# Patient Record
Sex: Male | Born: 1950
Health system: Southern US, Community
[De-identification: ages and names within clinical notes are randomized; demographics above are authoritative.]

## PROBLEM LIST (undated history)

## (undated) DIAGNOSIS — G43009 Migraine without aura, not intractable, without status migrainosus: Secondary | ICD-10-CM

## (undated) DIAGNOSIS — I2510 Atherosclerotic heart disease of native coronary artery without angina pectoris: Secondary | ICD-10-CM

## (undated) DIAGNOSIS — L0390 Cellulitis, unspecified: Secondary | ICD-10-CM

## (undated) DIAGNOSIS — D649 Anemia, unspecified: Secondary | ICD-10-CM

## (undated) DIAGNOSIS — K219 Gastro-esophageal reflux disease without esophagitis: Secondary | ICD-10-CM

## (undated) DIAGNOSIS — H269 Unspecified cataract: Secondary | ICD-10-CM

## (undated) DIAGNOSIS — Z8719 Personal history of other diseases of the digestive system: Secondary | ICD-10-CM

## (undated) DIAGNOSIS — Z87442 Personal history of urinary calculi: Secondary | ICD-10-CM

## (undated) DIAGNOSIS — N189 Chronic kidney disease, unspecified: Secondary | ICD-10-CM

## (undated) DIAGNOSIS — C4490 Unspecified malignant neoplasm of skin, unspecified: Secondary | ICD-10-CM

## (undated) DIAGNOSIS — H9190 Unspecified hearing loss, unspecified ear: Secondary | ICD-10-CM

## (undated) DIAGNOSIS — K635 Polyp of colon: Secondary | ICD-10-CM

## (undated) DIAGNOSIS — M1990 Unspecified osteoarthritis, unspecified site: Secondary | ICD-10-CM

## (undated) DIAGNOSIS — Z86711 Personal history of pulmonary embolism: Secondary | ICD-10-CM

## (undated) DIAGNOSIS — R0600 Dyspnea, unspecified: Secondary | ICD-10-CM

## (undated) DIAGNOSIS — G4730 Sleep apnea, unspecified: Secondary | ICD-10-CM

## (undated) DIAGNOSIS — I2699 Other pulmonary embolism without acute cor pulmonale: Secondary | ICD-10-CM

## (undated) DIAGNOSIS — R413 Other amnesia: Secondary | ICD-10-CM

## (undated) DIAGNOSIS — R011 Cardiac murmur, unspecified: Secondary | ICD-10-CM

## (undated) DIAGNOSIS — T7840XA Allergy, unspecified, initial encounter: Secondary | ICD-10-CM

## (undated) DIAGNOSIS — I10 Essential (primary) hypertension: Secondary | ICD-10-CM

## (undated) DIAGNOSIS — G510 Bell's palsy: Secondary | ICD-10-CM

## (undated) DIAGNOSIS — I509 Heart failure, unspecified: Secondary | ICD-10-CM

## (undated) DIAGNOSIS — H00019 Hordeolum externum unspecified eye, unspecified eyelid: Secondary | ICD-10-CM

## (undated) DIAGNOSIS — J189 Pneumonia, unspecified organism: Secondary | ICD-10-CM

## (undated) HISTORY — DX: Unspecified malignant neoplasm of skin, unspecified: C44.90

## (undated) HISTORY — PX: CATARACT EXTRACTION: SUR2

## (undated) HISTORY — PX: OTHER SURGICAL HISTORY: SHX169

## (undated) HISTORY — DX: Gastro-esophageal reflux disease without esophagitis: K21.9

## (undated) HISTORY — PX: HIATAL HERNIA REPAIR: SHX195

## (undated) HISTORY — DX: Polyp of colon: K63.5

## (undated) HISTORY — DX: Unspecified osteoarthritis, unspecified site: M19.90

## (undated) HISTORY — DX: Other amnesia: R41.3

## (undated) HISTORY — PX: VASECTOMY: SHX75

## (undated) HISTORY — DX: Allergy, unspecified, initial encounter: T78.40XA

## (undated) HISTORY — PX: CARDIAC CATHETERIZATION: SHX172

## (undated) HISTORY — DX: Migraine without aura, not intractable, without status migrainosus: G43.009

## (undated) HISTORY — DX: Essential (primary) hypertension: I10

## (undated) HISTORY — DX: Unspecified hearing loss, unspecified ear: H91.90

## (undated) HISTORY — PX: EYE SURGERY: SHX253

## (undated) HISTORY — DX: Unspecified cataract: H26.9

## (undated) HISTORY — PX: MYRINGOTOMY: SUR874

---
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
## 1992-12-23 HISTORY — PX: STRABISMUS SURGERY: SHX218

## 1996-12-23 HISTORY — PX: ORIF TIBIA & FIBULA FRACTURES: SHX2131

## 2011-03-19 ENCOUNTER — Ambulatory Visit: Payer: Self-pay | Admitting: Internal Medicine

## 2011-04-05 ENCOUNTER — Other Ambulatory Visit (INDEPENDENT_AMBULATORY_CARE_PROVIDER_SITE_OTHER): Payer: Self-pay | Admitting: Internal Medicine

## 2011-04-05 ENCOUNTER — Other Ambulatory Visit: Payer: Self-pay | Admitting: Internal Medicine

## 2011-04-05 ENCOUNTER — Other Ambulatory Visit (INDEPENDENT_AMBULATORY_CARE_PROVIDER_SITE_OTHER): Payer: Self-pay

## 2011-04-05 DIAGNOSIS — Z0389 Encounter for observation for other suspected diseases and conditions ruled out: Secondary | ICD-10-CM

## 2011-04-05 DIAGNOSIS — Z0000 Encounter for general adult medical examination without abnormal findings: Secondary | ICD-10-CM

## 2011-04-05 DIAGNOSIS — E785 Hyperlipidemia, unspecified: Secondary | ICD-10-CM

## 2011-04-05 LAB — CBC WITH DIFFERENTIAL/PLATELET
Basophils Relative: 0 % (ref 0.0–3.0)
Eosinophils Relative: 0 % (ref 0.0–5.0)
HCT: 42.3 % (ref 39.0–52.0)
Hemoglobin: 14.4 g/dL (ref 13.0–17.0)
Lymphs Abs: 1 10*3/uL (ref 0.7–4.0)
MCV: 96.9 fl (ref 78.0–100.0)
Monocytes Absolute: 0.5 10*3/uL (ref 0.1–1.0)
Monocytes Relative: 2.6 % — ABNORMAL LOW (ref 3.0–12.0)
Neutro Abs: 18.3 10*3/uL — ABNORMAL HIGH (ref 1.4–7.7)
Platelets: 291 10*3/uL (ref 150.0–400.0)
RBC: 4.37 Mil/uL (ref 4.22–5.81)
WBC: 19.8 10*3/uL (ref 4.5–10.5)

## 2011-04-05 LAB — LIPID PANEL
Cholesterol: 232 mg/dL — ABNORMAL HIGH (ref 0–200)
Total CHOL/HDL Ratio: 5
Triglycerides: 84 mg/dL (ref 0.0–149.0)

## 2011-04-05 LAB — BASIC METABOLIC PANEL
BUN: 12 mg/dL (ref 6–23)
Chloride: 110 mEq/L (ref 96–112)
GFR: 123.91 mL/min (ref >60.00)
Potassium: 4.1 mEq/L (ref 3.5–5.1)
Sodium: 142 mEq/L (ref 135–145)

## 2011-04-05 LAB — URINALYSIS
Bilirubin Urine: NEGATIVE
Hgb urine dipstick: NEGATIVE
Total Protein, Urine: NEGATIVE
Urine Glucose: NEGATIVE

## 2011-04-05 LAB — HEPATIC FUNCTION PANEL
ALT: 42 U/L (ref 0–53)
AST: 23 U/L (ref 0–37)
Total Bilirubin: 0.6 mg/dL (ref 0.3–1.2)
Total Protein: 7.1 g/dL (ref 6.0–8.3)

## 2011-04-10 ENCOUNTER — Ambulatory Visit (INDEPENDENT_AMBULATORY_CARE_PROVIDER_SITE_OTHER): Payer: 59 | Admitting: Internal Medicine

## 2011-04-10 ENCOUNTER — Encounter: Payer: Self-pay | Admitting: Internal Medicine

## 2011-04-10 DIAGNOSIS — T6391XA Toxic effect of contact with unspecified venomous animal, accidental (unintentional), initial encounter: Secondary | ICD-10-CM

## 2011-04-10 DIAGNOSIS — E1121 Type 2 diabetes mellitus with diabetic nephropathy: Secondary | ICD-10-CM | POA: Insufficient documentation

## 2011-04-10 DIAGNOSIS — M159 Polyosteoarthritis, unspecified: Secondary | ICD-10-CM | POA: Insufficient documentation

## 2011-04-10 DIAGNOSIS — G43009 Migraine without aura, not intractable, without status migrainosus: Secondary | ICD-10-CM | POA: Insufficient documentation

## 2011-04-10 DIAGNOSIS — E119 Type 2 diabetes mellitus without complications: Secondary | ICD-10-CM

## 2011-04-10 DIAGNOSIS — K219 Gastro-esophageal reflux disease without esophagitis: Secondary | ICD-10-CM

## 2011-04-10 DIAGNOSIS — M1990 Unspecified osteoarthritis, unspecified site: Secondary | ICD-10-CM

## 2011-04-10 DIAGNOSIS — E1122 Type 2 diabetes mellitus with diabetic chronic kidney disease: Secondary | ICD-10-CM | POA: Insufficient documentation

## 2011-04-10 DIAGNOSIS — Z91030 Bee allergy status: Secondary | ICD-10-CM | POA: Insufficient documentation

## 2011-04-10 DIAGNOSIS — M109 Gout, unspecified: Secondary | ICD-10-CM | POA: Insufficient documentation

## 2011-04-10 DIAGNOSIS — Z23 Encounter for immunization: Secondary | ICD-10-CM

## 2011-04-10 DIAGNOSIS — J309 Allergic rhinitis, unspecified: Secondary | ICD-10-CM | POA: Insufficient documentation

## 2011-04-10 DIAGNOSIS — T63461A Toxic effect of venom of wasps, accidental (unintentional), initial encounter: Secondary | ICD-10-CM

## 2011-04-10 DIAGNOSIS — I10 Essential (primary) hypertension: Secondary | ICD-10-CM | POA: Insufficient documentation

## 2011-04-10 MED ORDER — TETANUS-DIPHTH-ACELL PERTUSSIS 5-2.5-18.5 LF-MCG/0.5 IM SUSP: 0.5000 mL | Freq: Once | INTRAMUSCULAR | Status: DC

## 2011-04-10 MED ORDER — PNEUMOCOCCAL VAC POLYVALENT 25 MCG/0.5ML IJ INJ: 0.5000 mL | INJECTION | Freq: Once | INTRAMUSCULAR | Status: DC

## 2011-04-10 MED ORDER — HYDROCODONE-ACETAMINOPHEN 5-325 MG PO TABS: 1.0000 | ORAL_TABLET | Freq: Two times a day (BID) | ORAL | Status: DC

## 2011-04-10 NOTE — Progress Notes (Signed)
Subjective:    Patient ID: Garrett [name], male    DOB: Apr 25, 1951, 60 y.o.   MRN: [id number]  HPIMr. Molino presents to establish for on-going continuity care. He has multiple medical problems but has no specific acute problems today.   Patient has diffuse arthritis. He did have a right hip injection recently by a PA at clinic in [place], wich may explain elevated WBC in the absence of any infection. May need follow-up  Past Medical History  Diagnosis Date  . Asthma     childhood asthma - not a active adult problem  . Arthritis     diffuse; shoulders, hips, knees - limits activities  . Gout     has gout in the back, diagnosed in his 30's. Diagnosis by aspiration  . Diabetes mellitus     has some peripheral neuropathy  . Migraine headache without aura     intermittently responsive to imitrex.  . Hypertension   . Colon polyps     last colonoscopy 2011  . GERD (gastroesophageal reflux disease)     controlled PPI use  . Allergy     hymenoptra with anaphylaxis, seasonal allergy as well.  Garlic allergy - angioedema   Past Surgical History  Procedure Date  . Vasectomy   . Cardiac catheterization '95    radial artery approach  . Myringotomy     several occasions '02-'03 for dizziness  . Strabismus surgery 1994    left eye  . Orif tibia & fibula fractures 1998    jumping off a wall  . Incision and drain '03    staph infection right elbow - required open surgery  . Hiatal hernia repair     done twice: '82 and 04   Family History  Problem Relation Age of Onset  . Hypertension Mother   . Dementia Mother   . Hypertension Sister   . Diabetes Maternal Grandmother   . Heart disease Maternal Grandfather   . Heart disease Paternal Grandmother   . Stroke Paternal Grandmother    History   Social History  . Marital Status: Married    Spouse Name: Foye Deer    Number of Children: N/A  . Years of Education: 13   Occupational History  . HVAC     self employed   Social  History Main Topics  . Smoking status: Former Smoker -- 3.0 packs/day for 30 years    Types: Cigarettes    Quit date: 01/09/1991  . Smokeless tobacco: Current User  . Alcohol Use: No  . Drug Use: Not on file  . Sexually Active: Not on file   Other Topics Concern  . Not on file   Social History Narrative   HSG, college graduate, [address] - [address]. Married '70. 1 son - '73; 2 grandchildren. 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 Marriage is in good health. End of Life - fully resuscitate, ok for short-term reversible mechanical ventilation, no prolonged heroic or futile care.            Review of Systems Review of Systems  Constitutional:  Negative for fever, chills, activity change and unexpected weight change.  HENT:  Positive for hearing loss Negative  ear pain, congestion, neck stiffness and postnasal drip.  Seasonal allergy Eyes: Negative for pain, discharge and visual disturbance.  Respiratory: Negative for chest tightness and wheezing.   Cardiovascular: Negative for chest pain and palpitations.       [No decreased exercise tolerance Gastrointestinal: [No  change in bowel habit. No bloating or gas. No reflux or indigestion Genitourinary: Negative for urgency, frequency, flank pain and difficulty urinating.  Musculoskeletal: Negative for myalgias, back pain, arthralgias and gait problem.  Neurological: Negative for dizziness, tremors, weakness and headaches.  Hematological: Negative for adenopathy.  Psychiatric/Behavioral: Negative for behavioral problems and dysphoric mood.       Objective:   Physical Exam WNWD heavyset white male in no distress HEENT - hearing aids in both ears. C&S clear, PERRLA, oropharynx without lesions. Posterior pharynx clear Neck - supple without thyromegaly Chest - increased AP diameter. No deformity Lungs - Clear  Cor - RRR without murmur or gallop Abdomen - soft, no guarding rebound or  tenderness Extremities - no deformity, normal peripheral pulses. Normal ROM about all joints Neuro - no focal findings Skin - clear   Outside lab: May '11 - CBC nl, Bmet normal x/ glucose 124, TSH 0.945, Chol 196, HDL 37, LDL 106, Tgy 267,PSA 0.85, Uric acid 4.2, A1C 6.2%       Assessment & Plan:  1. Hypertension - good control on present medications. Had normal electrolytes and renal function in May '11  2. Gout - stable with no recent flares. Location of gout flares were spinal. He is on allopurinol and last Uric Acid was in normal range May '11  3. Diabetes - stable with last A1C 5.9% in May '11  4. Lipids - normal  5. OA- diffuse OA. Currently he is doing ok on Celebrex, hydrocodone as needed.  6. Migraine - still has occasional headaches and does use imitrex prn.   7. Seasonal allergy - well controlled on nasal inhalational steroids.   In summary - a nice man who is now established for care. He is oriented to practice and services. He will return in May for wellness exam and annual labs.

## 2011-04-15 ENCOUNTER — Encounter: Payer: Self-pay | Admitting: Internal Medicine

## 2011-04-16 ENCOUNTER — Encounter: Payer: Self-pay | Admitting: Internal Medicine

## 2011-04-17 ENCOUNTER — Other Ambulatory Visit: Payer: Self-pay | Admitting: *Deleted

## 2011-04-17 MED ORDER — ALLOPURINOL 300 MG PO TABS: 300.0000 mg | ORAL_TABLET | Freq: Every day | ORAL | Status: DC

## 2011-04-17 MED ORDER — METOPROLOL TARTRATE 100 MG PO TABS: 100.0000 mg | ORAL_TABLET | Freq: Two times a day (BID) | ORAL | Status: DC

## 2011-04-17 MED ORDER — RAMIPRIL 10 MG PO CAPS: 10.0000 mg | ORAL_CAPSULE | Freq: Every day | ORAL | Status: DC

## 2011-04-17 MED ORDER — VERAPAMIL HCL ER 240 MG PO TBCR: 240.0000 mg | EXTENDED_RELEASE_TABLET | Freq: Every day | ORAL | Status: DC

## 2011-04-17 MED ORDER — CELECOXIB 200 MG PO CAPS: 200.0000 mg | ORAL_CAPSULE | Freq: Two times a day (BID) | ORAL | Status: DC

## 2011-04-17 MED ORDER — SUMATRIPTAN SUCCINATE 50 MG PO TABS: 50.0000 mg | ORAL_TABLET | ORAL | Status: DC | PRN

## 2011-04-17 MED ORDER — PANTOPRAZOLE SODIUM 40 MG PO TBEC: 40.0000 mg | DELAYED_RELEASE_TABLET | Freq: Two times a day (BID) | ORAL | Status: DC

## 2011-04-17 MED ORDER — MECLIZINE HCL 50 MG PO TABS: 50.0000 mg | ORAL_TABLET | Freq: Three times a day (TID) | ORAL | Status: DC | PRN

## 2011-04-17 MED ORDER — MOMETASONE FUROATE 50 MCG/ACT NA SUSP: 2.0000 | Freq: Every day | NASAL | Status: DC

## 2011-04-18 ENCOUNTER — Encounter: Payer: Self-pay | Admitting: Internal Medicine

## 2011-04-23 ENCOUNTER — Telehealth: Payer: Self-pay

## 2011-04-23 ENCOUNTER — Emergency Department (HOSPITAL_COMMUNITY)
Admission: EM | Admit: 2011-04-23 | Discharge: 2011-04-23 | Disposition: A | Discharge status: Home / Self Care | Payer: 59 | Attending: Emergency Medicine | Admitting: Emergency Medicine

## 2011-04-23 DIAGNOSIS — L02419 Cutaneous abscess of limb, unspecified: Secondary | ICD-10-CM | POA: Insufficient documentation

## 2011-04-23 DIAGNOSIS — M79609 Pain in unspecified limb: Secondary | ICD-10-CM | POA: Insufficient documentation

## 2011-04-23 DIAGNOSIS — I10 Essential (primary) hypertension: Secondary | ICD-10-CM | POA: Insufficient documentation

## 2011-04-23 DIAGNOSIS — M129 Arthropathy, unspecified: Secondary | ICD-10-CM | POA: Insufficient documentation

## 2011-04-23 DIAGNOSIS — R599 Enlarged lymph nodes, unspecified: Secondary | ICD-10-CM | POA: Insufficient documentation

## 2011-04-23 LAB — CBC
Hemoglobin: 13.8 g/dL (ref 13.0–17.0)
MCHC: 33.7 g/dL (ref 30.0–36.0)
RDW: 13.5 % (ref 11.5–15.5)
WBC: 10.3 10*3/uL (ref 4.0–10.5)

## 2011-04-23 LAB — BASIC METABOLIC PANEL
Calcium: 9.3 mg/dL (ref 8.4–10.5)
GFR calc Af Amer: >60 mL/min (ref >60)
GFR calc non Af Amer: >60 mL/min (ref >60)
Potassium: 3.5 mEq/L (ref 3.5–5.1)
Sodium: 142 mEq/L (ref 135–145)

## 2011-04-23 LAB — DIFFERENTIAL
Basophils Absolute: 0 10*3/uL (ref 0.0–0.1)
Basophils Relative: 0 % (ref 0–1)
Eosinophils Relative: 2 % (ref 0–5)
Lymphocytes Relative: 19 % (ref 12–46)
Monocytes Absolute: 0.8 10*3/uL (ref 0.1–1.0)
Neutro Abs: 7.4 10*3/uL (ref 1.7–7.7)

## 2011-04-23 NOTE — Telephone Encounter (Signed)
Patient called [facility] requesting med refill for topamax.  No current records per EMR or Epic, only shows an upcoming appointment for 05/27/11

## 2011-04-24 MED ORDER — TOPIRAMATE 25 MG PO TABS: 25.0000 mg | ORAL_TABLET | Freq: Three times a day (TID) | ORAL | Status: DC

## 2011-04-24 NOTE — Telephone Encounter (Signed)
Ok per dr. Debby Bud to fill Topamax 25mg  1 po tid. # 90

## 2011-04-30 ENCOUNTER — Encounter: Payer: Self-pay | Admitting: Internal Medicine

## 2011-05-03 ENCOUNTER — Ambulatory Visit (INDEPENDENT_AMBULATORY_CARE_PROVIDER_SITE_OTHER): Payer: 59 | Admitting: Internal Medicine

## 2011-05-03 ENCOUNTER — Encounter: Payer: Self-pay | Admitting: Internal Medicine

## 2011-05-03 ENCOUNTER — Ambulatory Visit: Payer: 59 | Admitting: Internal Medicine

## 2011-05-03 VITALS — BP 130/78 | HR 63 | Temp 97.8°F | Resp 14 | Wt 228.5 lb

## 2011-05-03 DIAGNOSIS — L03119 Cellulitis of unspecified part of limb: Secondary | ICD-10-CM

## 2011-05-03 DIAGNOSIS — L02419 Cutaneous abscess of limb, unspecified: Secondary | ICD-10-CM

## 2011-05-05 NOTE — Progress Notes (Signed)
  Subjective:    Patient ID: Garrett [name], male    DOB: [birth year], 60 y.o.   MRN: [id number]  HPI Garrett [name] ws seen as a new patient in April. He had received a copy of that note and has no additions to corrections to make.  May 1st he was seen in the ED for bilateral leg pain right greater than left. ED records reviewed: exam with mild swelling of the distal LE, mild erythema distal right LE. He had normal lab, including WBC 10.4; negative LE venous doppler. He was diagnosed with cellulitis and started on clindamycin. He presents for follow-up. The pain is much better but there is residual erythema.  PMH, FamHx and SocHx reviewed for any changes and relevance.    Review of Systems Review of Systems  Constitutional:  Negative for fever, chills, activity change and unexpected weight change.  HENT:  Negative for hearing loss, ear pain, congestion, neck stiffness and postnasal drip.   Eyes: Negative for pain, discharge and visual disturbance.  Respiratory: Negative for chest tightness and wheezing.   Cardiovascular: Negative for chest pain and palpitations.       [No decreased exercise tolerance Gastrointestinal: [No change in bowel habit. No bloating or gas. No reflux or indigestion Genitourinary: Negative for urgency, frequency, flank pain and difficulty urinating.  Musculoskeletal: Negative for myalgias, back pain, arthralgias and gait problem.  Neurological: Negative for dizziness, tremors, weakness and headaches.  Hematological: Negative for adenopathy.  Psychiatric/Behavioral: Negative for behavioral problems and dysphoric mood.       Objective:   Physical Exam WNWD white man in no distress Vital signs - reviewed HEENT - nl Chest- clear Cor -RRR Derm - mild erythema of the leg, no warmth, no tenderness.         Assessment & Plan:  1. Cellulitis LE - mild residual erythema.  Plan - for increasing redness, for tenderness to touch or fever will extend clindamycin for  additional 5 days - time limited Rx provided.

## 2011-05-27 ENCOUNTER — Encounter: Payer: Self-pay | Admitting: Internal Medicine

## 2011-05-27 ENCOUNTER — Ambulatory Visit (INDEPENDENT_AMBULATORY_CARE_PROVIDER_SITE_OTHER): Payer: 59 | Admitting: Internal Medicine

## 2011-05-27 DIAGNOSIS — E119 Type 2 diabetes mellitus without complications: Secondary | ICD-10-CM

## 2011-05-27 DIAGNOSIS — I10 Essential (primary) hypertension: Secondary | ICD-10-CM

## 2011-05-27 DIAGNOSIS — E1169 Type 2 diabetes mellitus with other specified complication: Secondary | ICD-10-CM | POA: Insufficient documentation

## 2011-05-27 DIAGNOSIS — M109 Gout, unspecified: Secondary | ICD-10-CM

## 2011-05-27 DIAGNOSIS — R413 Other amnesia: Secondary | ICD-10-CM

## 2011-05-27 DIAGNOSIS — E785 Hyperlipidemia, unspecified: Secondary | ICD-10-CM

## 2011-05-27 NOTE — Progress Notes (Signed)
  Subjective:    Patient ID: Garrett [name], male    DOB: July 28, 1951, 60 y.o.   MRN: [id number]  HPI Mr. Duval returns for follow-up. He was last seen May 13th for leg infection which has resolved. He had full physical at his intial visit except for MMSE. He has no specifice complaints.   PMH, FamHx and SocHx reviewed for any changes and relevance.    Review of Systems Review of Systems  Constitutional:  Negative for fever, chills, activity change and unexpected weight change.  HENT:  Negative for hearing loss, ear pain, congestion, neck stiffness and postnasal drip.   Eyes: Negative for pain, discharge and visual disturbance.  Respiratory: Negative for chest tightness and wheezing.   Cardiovascular: Negative for chest pain and palpitations.       [No decreased exercise tolerance Gastrointestinal: [No change in bowel habit. No bloating or gas. No reflux or indigestion Genitourinary: Negative for urgency, frequency, flank pain and difficulty urinating.  Musculoskeletal: Negative for myalgias, back pain, arthralgias and gait problem.  Neurological: Negative for dizziness, tremors, weakness and headaches.  Hematological: Negative for adenopathy.  Psychiatric/Behavioral: Negative for behavioral problems and dysphoric mood.      Objective:   Physical Exam Vitals noted. Chest - no increased work of breathing Cor - RRR MMSE: 1. Day,date,year - OK 2. Content: president- OK  Gov. - OK Current events - ok 3. Number repitition: 5 fwd -ok    5 rev - ok -slow     World reversed - ok 4. 3 word recall - 3/3 with one hint 5. Serial 7's - ok   , nickles in $1.25- ok    Change making - ok 6. Naming objects -ok          4 legged creatures-fluid 7. Parables:  [place] - struggled -ok     Rolling stone - ok but different 8. Judgement:  Letter  ok        Fire ok 9. Clock face exercise ok, fluid        Assessment & Plan:

## 2011-05-27 NOTE — Patient Instructions (Addendum)
Blood pressure is well controlled.  Mental status exam is normal with no evidence of memory loss objectively. By your report you are having memory problems: forgetting meds, missing appointments, etc. Plan - start Namenda in order to retard to halt progressive memory loss.   Cholesterol - last LDL (bad) 175 with a goal of 100 or less. Before starting medication will recheck July 16th  Diabetes - will recheck A1C, the 90 day test, July 16th.

## 2011-05-28 DIAGNOSIS — F0390 Unspecified dementia without behavioral disturbance: Secondary | ICD-10-CM | POA: Insufficient documentation

## 2011-05-28 DIAGNOSIS — F03A0 Unspecified dementia, mild, without behavioral disturbance, psychotic disturbance, mood disturbance, and anxiety: Secondary | ICD-10-CM | POA: Insufficient documentation

## 2011-05-28 NOTE — Assessment & Plan Note (Signed)
Patient performed well on the MMSE with only some hesitancy on some questions. Given the very high level of function that he reports he formerly had along with his report of frequent memory lapses: forgetting appointments, locations, tasks he may have more of a deficit than testing reveals.  Plan - will start Namenda using starter pak. If he tolerates medication will eScribe full prescription.

## 2011-05-28 NOTE — Assessment & Plan Note (Signed)
Good control at today's visit. Will continue present medication.  Plan - routine lab.

## 2011-05-28 NOTE — Assessment & Plan Note (Signed)
Tolerating allopurinol 300mg  daily.  Plan - Uric acid level. Recommendations to follow.

## 2011-05-28 NOTE — Assessment & Plan Note (Signed)
Last lab with elevated fasting serum glucose.  Plan - to lab for A1C - recommendations regarding medication to be based on results

## 2011-06-12 ENCOUNTER — Other Ambulatory Visit: Payer: Self-pay | Admitting: *Deleted

## 2011-06-12 MED ORDER — TOPIRAMATE 25 MG PO TABS: 25.0000 mg | ORAL_TABLET | Freq: Three times a day (TID) | ORAL | Status: DC

## 2011-07-09 ENCOUNTER — Other Ambulatory Visit: Payer: Self-pay | Admitting: Internal Medicine

## 2011-07-09 ENCOUNTER — Other Ambulatory Visit (INDEPENDENT_AMBULATORY_CARE_PROVIDER_SITE_OTHER): Payer: 59

## 2011-07-09 DIAGNOSIS — M109 Gout, unspecified: Secondary | ICD-10-CM

## 2011-07-09 DIAGNOSIS — E785 Hyperlipidemia, unspecified: Secondary | ICD-10-CM

## 2011-07-09 DIAGNOSIS — I10 Essential (primary) hypertension: Secondary | ICD-10-CM

## 2011-07-09 DIAGNOSIS — E119 Type 2 diabetes mellitus without complications: Secondary | ICD-10-CM

## 2011-07-09 LAB — COMPREHENSIVE METABOLIC PANEL
BUN: 17 mg/dL (ref 6–23)
CO2: 25 mEq/L (ref 19–32)
Calcium: 9.1 mg/dL (ref 8.4–10.5)
Chloride: 108 mEq/L (ref 96–112)
Creatinine, Ser: 0.9 mg/dL (ref 0.4–1.5)
GFR: 87.72 mL/min (ref >60.00)

## 2011-07-09 LAB — URIC ACID: Uric Acid, Serum: 4.4 mg/dL (ref 4.0–7.8)

## 2011-07-09 LAB — LIPID PANEL: Cholesterol: 194 mg/dL (ref 0–200)

## 2011-07-09 LAB — LDL CHOLESTEROL, DIRECT: Direct LDL: 135 mg/dL

## 2011-07-17 ENCOUNTER — Encounter: Payer: Self-pay | Admitting: Internal Medicine

## 2011-07-22 ENCOUNTER — Telehealth: Payer: Self-pay

## 2011-07-22 NOTE — Telephone Encounter (Signed)
Wife called [facility] requesting lab orders/DNA to check to see if her husband is a carrier for hemochromatosis. Per wife she was seen by Dr. Cyndie Chime and dx with hemochromatosis

## 2011-07-26 NOTE — Telephone Encounter (Signed)
Pt's wife called again requesting status of request for testing for hemachromatosis.

## 2011-07-31 NOTE — Telephone Encounter (Signed)
Pt stopped by - wife has dx of hemachromatosis and Dr Marlena Clipper suggested that Mr Balazs get "DNA blood test"

## 2011-08-01 NOTE — Telephone Encounter (Signed)
No answer at [facility] #, attempted to call wife at wk today but she was at lunch.

## 2011-08-01 NOTE — Telephone Encounter (Signed)
Recommend starting with a fasting serum transferrin and ferritin. If negative would not move ahead with genetic testing for HFE gene. Orders enetered

## 2011-08-02 NOTE — Telephone Encounter (Signed)
Informed pt, he will come to lab and have blood work next week

## 2011-08-08 ENCOUNTER — Ambulatory Visit: Payer: 59 | Admitting: Internal Medicine

## 2011-08-12 ENCOUNTER — Other Ambulatory Visit (INDEPENDENT_AMBULATORY_CARE_PROVIDER_SITE_OTHER): Payer: 59

## 2011-08-12 ENCOUNTER — Ambulatory Visit (INDEPENDENT_AMBULATORY_CARE_PROVIDER_SITE_OTHER): Payer: 59 | Admitting: Internal Medicine

## 2011-08-12 VITALS — BP 134/68 | HR 71 | Temp 97.9°F | Wt 234.0 lb

## 2011-08-12 DIAGNOSIS — R609 Edema, unspecified: Secondary | ICD-10-CM

## 2011-08-12 DIAGNOSIS — L299 Pruritus, unspecified: Secondary | ICD-10-CM

## 2011-08-12 LAB — FERRITIN: Ferritin: 88 ng/mL (ref 22.0–322.0)

## 2011-08-12 MED ORDER — DONEPEZIL HCL 5 MG PO TABS: 5.0000 mg | ORAL_TABLET | Freq: Every day | ORAL | Status: DC
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
## 2011-08-12 NOTE — Patient Instructions (Signed)
Swelling of the lower legs: most likely venous insufficiency (see handout below) but we will do a quantitative measure of kidney function to be sure that the kidney is not the source of the problem. Reviewed Heart study from last fall which was normal, ruling out heart failure as cause of swelling.  Itching - reviewed your last labs and liver functions and thyroid were normal. Plan - zyrtec, allegra or claritin (generics) once a day to help the itching.            Good moisturizing creams or lotions.           For unrelieved symptoms return and we will get additional lab studies.   Memory - since namenda didn't work will try aricept start up and then full dose at 10 mg once a day.   Venous Stasis & Chronic Venous Insufficiency As people age, the veins located in their legs may weaken and stretch. When veins weaken and lose the ability to pump blood effectively, the condition is called chronic venous insufficiency (CVI) or venous stasis.   Almost all veins return blood back to the heart. This happens by:  The force of the heart pumping fresh blood pushes blood back to the heart.   Blood flowing to the heart from the force of gravity.  In the deep veins of the legs, blood has to fight gravity and flow upstream back to the heart. Here, the leg muscles contract to pump blood back toward the heart.   Vein walls are elastic, and many veins have small valves that only allow blood to flow in one direction. When leg muscles contract, they push inward against the elastic vein walls. This squeezes blood upward, opens the valves, and moves blood toward the heart. When leg muscles relax, the vein wall also relaxes and the valves inside the vein close to prevent blood from flowing backward. This method of pumping blood out of the legs is called the venous pump. CAUSES The venous pump works best while walking and leg muscles are contracting. But when a person sits or stands, blood pressure in leg veins can  build. Deep veins are usually able to withstand short periods of inactivity, but long periods of inactivity (and increased pressure) can stretch, weaken, and damage vein walls.  High blood pressure can also stretch and damage vein walls. The veins may no longer be able to pump blood back to the heart. Venous hypertension (high blood pressure inside veins) that lasts over time is a primary cause of CVI. CVI can also be caused by:    Deep vein thrombosis, a condition where a thrombus (blood clot) blocks blood flow in a vein.   Phlebitis, an inflammation of a superficial vein that causes a blood clot to form.  Other risk factors for CVI may include:    Heredity.   Obesity.   Pregnancy.   Sedentary lifestyle.   Smoking.   Jobs requiring long periods of standing or sitting in one place.   Age and gender:   Women in their 39's and 73's and men in their 54's are more prone to developing CVI.  SYMPTOMS Symptoms of CVI may include:    Varicose veins.   Ulceration or skin breakdown.   Lipodermatosclerosis, a condition that affects the skin just above the ankle, usually on the inside surface. Over time the skin becomes brown, smooth, tight and often painful. Those with this condition have a high risk of developing skin ulcers.   Reddened or  discolored skin on the leg.   Swelling.  DIAGNOSIS Your caregiver can diagnose CVI after performing a careful medical history and physical examination. To confirm the diagnosis, the following tests may also be ordered:    Duplex ultrasound.   Plethysmography (tests blood flow).   Venograms (x-ray using a special dye).  TREATMENT The goals of treatment for CVI are to restore a person to an active life and to minimize pain or disability. Typically, CVI does not pose a serious threat to life or limb, and with proper treatment most people with this condition can continue to lead active lives. In most cases, mild CVI can be treated on an outpatient  basis with simple procedures. Treatment methods include:    Elastic compression socks.   Sclerotherapy, a procedure involving an injection of a material that "dissolves" the damaged veins. Other veins in the network of blood vessels take over the function of the damaged veins.   Vein stripping (an older procedure less commonly used).   Laser Ablation surgery.   Valve repair.  HOME CARE INSTRUCTIONS  Elastic compression socks must be worn every day. They can help with symptoms and lower the chances of the problem getting worse, but they do not cure the problem.   Only take over-the-counter or prescription medicines for pain, discomfort, or fever as directed by your caregiver.   Your caregiver will review your other medications with you.  SEEK MEDICAL CARE IF:  You are confused about how to take your medications.   There is redness, swelling, or increasing pain in the affected area.   There is a red streak or line that extends up or down from the affected area.   There is a breakdown or loss of skin in the affected area, even if the breakdown is small.   You develop an unexplained oral temperature above 100.   There is an injury to the affected area.  SEEK IMMEDIATE MEDICAL CARE IF:  There is an injury and open wound to the affected area.   Pain is not adequately relieved with pain medication prescribed or becomes severe.   An oral temperature above 100 develops.   The foot/ankle below the affected area becomes suddenly numb or the area feels weak and hard to move.  MAKE SURE YOU:    Understand these instructions.   Will watch your condition.   Will get help right away if you are not doing well or get worse.  Document Released: 04/14/2007 Document Re-Released: 11/21/2008 [facility] Patient Information 2011 [place], [place].

## 2011-08-14 ENCOUNTER — Other Ambulatory Visit: Payer: 59

## 2011-08-14 DIAGNOSIS — R609 Edema, unspecified: Secondary | ICD-10-CM

## 2011-08-15 LAB — PROTEIN, URINE, 24 HOUR: Protein, 24H Urine: 57 mg/d (ref 50–100)

## 2011-08-15 LAB — CREATININE CLEARANCE, URINE, 24 HOUR: Creatinine: 1.05 mg/dL (ref 0.50–1.35)

## 2011-08-16 ENCOUNTER — Encounter: Payer: Self-pay | Admitting: Internal Medicine

## 2011-08-16 ENCOUNTER — Telehealth: Payer: Self-pay | Admitting: Internal Medicine

## 2011-08-16 NOTE — Telephone Encounter (Signed)
24 hr urine studies negative - normal renal function. Swelling most likely venous insufficiency

## 2011-08-16 NOTE — Telephone Encounter (Signed)
Pts wife informed.

## 2011-08-19 ENCOUNTER — Encounter: Payer: Self-pay | Admitting: Internal Medicine

## 2011-09-24 ENCOUNTER — Other Ambulatory Visit (INDEPENDENT_AMBULATORY_CARE_PROVIDER_SITE_OTHER): Payer: 59

## 2011-09-24 ENCOUNTER — Ambulatory Visit (INDEPENDENT_AMBULATORY_CARE_PROVIDER_SITE_OTHER): Payer: 59 | Admitting: Neurology

## 2011-09-24 ENCOUNTER — Encounter: Payer: Self-pay | Admitting: Neurology

## 2011-09-24 DIAGNOSIS — G609 Hereditary and idiopathic neuropathy, unspecified: Secondary | ICD-10-CM

## 2011-09-24 DIAGNOSIS — R413 Other amnesia: Secondary | ICD-10-CM

## 2011-09-24 LAB — VITAMIN B12: Vitamin B-12: 1014 pg/mL — ABNORMAL HIGH (ref 211–911)

## 2011-09-24 NOTE — Patient Instructions (Signed)
Go to the basement to have your labs drawn today.  Your MRI has been scheduled for Tuesday, October 9th at 8:00am.  Please arrive to [facility] by 7:45am.  Your appointment for the memory loss testing is scheduled for tomorrow October, 3rd at [facility] [address]. [place], [place]  [phone]
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
## 2011-09-26 ENCOUNTER — Other Ambulatory Visit: Payer: Self-pay | Admitting: Internal Medicine

## 2011-09-26 NOTE — Telephone Encounter (Signed)
Pt requesting refill on Soma pt wanting prescription sent to [facility] pt pharm. Please Advise refill for pt in Dr Debby Bud absence

## 2011-09-27 NOTE — Telephone Encounter (Signed)
OK  For soma Rx 1 PO qhs, #30, refill x 5

## 2011-10-01 ENCOUNTER — Telehealth: Payer: Self-pay | Admitting: *Deleted

## 2011-10-01 ENCOUNTER — Ambulatory Visit (HOSPITAL_COMMUNITY)
Admission: RE | Admit: 2011-10-01 | Discharge: 2011-10-01 | Disposition: A | Discharge status: Home / Self Care | Payer: 59 | Source: Ambulatory Visit | Attending: Neurology | Admitting: Neurology

## 2011-10-01 DIAGNOSIS — R259 Unspecified abnormal involuntary movements: Secondary | ICD-10-CM | POA: Insufficient documentation

## 2011-10-01 DIAGNOSIS — G319 Degenerative disease of nervous system, unspecified: Secondary | ICD-10-CM | POA: Insufficient documentation

## 2011-10-01 DIAGNOSIS — G609 Hereditary and idiopathic neuropathy, unspecified: Secondary | ICD-10-CM

## 2011-10-01 DIAGNOSIS — F29 Unspecified psychosis not due to a substance or known physiological condition: Secondary | ICD-10-CM | POA: Insufficient documentation

## 2011-10-01 DIAGNOSIS — I6789 Other cerebrovascular disease: Secondary | ICD-10-CM | POA: Insufficient documentation

## 2011-10-01 DIAGNOSIS — R413 Other amnesia: Secondary | ICD-10-CM | POA: Insufficient documentation

## 2011-10-01 MED ORDER — CARISOPRODOL 350 MG PO TABS: 350.0000 mg | ORAL_TABLET | Freq: Three times a day (TID) | ORAL | Status: DC | PRN

## 2011-10-01 NOTE — Telephone Encounter (Signed)
Wife is req a call back regarding 2 rf requests. Unsure name of meds.

## 2011-10-02 LAB — METHYLMALONIC ACID, SERUM: Methylmalonic Acid, Quantitative: 164 nmol/L (ref 87–318)

## 2011-10-02 NOTE — Telephone Encounter (Signed)
Prescriptions were sent in to [facility] pt pharm

## 2011-10-04 ENCOUNTER — Telehealth: Payer: Self-pay | Admitting: *Deleted

## 2011-10-04 MED ORDER — CARISOPRODOL 350 MG PO TABS: 350.0000 mg | ORAL_TABLET | Freq: Three times a day (TID) | ORAL | Status: DC | PRN

## 2011-10-04 NOTE — Telephone Encounter (Signed)
Ok for refill of soma tid, x 5

## 2011-10-04 NOTE — Telephone Encounter (Signed)
I called in Soma for qty of 30 . Pt takes medication TID, can I correct prescription to qty of 90

## 2011-10-04 NOTE — Telephone Encounter (Signed)
Pt informed

## 2011-10-04 NOTE — Telephone Encounter (Signed)
done

## 2011-10-04 NOTE — Telephone Encounter (Signed)
Per patient, he takes soma TID. OK for RF w/updated directions?

## 2011-10-06 ENCOUNTER — Encounter: Payer: Self-pay | Admitting: Neurology

## 2011-11-11 ENCOUNTER — Ambulatory Visit: Payer: 59 | Admitting: Neurology

## 2011-11-12 ENCOUNTER — Telehealth: Payer: Self-pay | Admitting: *Deleted

## 2011-11-12 MED ORDER — CARISOPRODOL 250 MG PO TABS: 250.0000 mg | ORAL_TABLET | Freq: Three times a day (TID) | ORAL | Status: DC | PRN

## 2011-11-12 NOTE — Telephone Encounter (Signed)
Rx for soma 250mg  tid prn #100 sent to [facility] pharmacy with 2 refills

## 2011-11-12 NOTE — Telephone Encounter (Signed)
Pt's wife called, he cannot take Soma 350 mg it is too strong. Pt previously took 250 mg tid prn with 90 day supply please advise qty if you would like to send in different mg. Last prescription sent in in OCT

## 2011-11-26 ENCOUNTER — Ambulatory Visit (INDEPENDENT_AMBULATORY_CARE_PROVIDER_SITE_OTHER): Payer: 59 | Admitting: Endocrinology

## 2011-11-26 ENCOUNTER — Encounter: Payer: Self-pay | Admitting: Endocrinology

## 2011-11-26 ENCOUNTER — Other Ambulatory Visit (INDEPENDENT_AMBULATORY_CARE_PROVIDER_SITE_OTHER): Payer: 59

## 2011-11-26 VITALS — BP 128/76 | HR 72 | Temp 98.3°F | Ht 70.0 in | Wt 233.2 lb

## 2011-11-26 DIAGNOSIS — E119 Type 2 diabetes mellitus without complications: Secondary | ICD-10-CM

## 2011-11-26 LAB — HEMOGLOBIN A1C: Hgb A1c MFr Bld: 6.1 % (ref 4.6–6.5)

## 2011-11-26 NOTE — Patient Instructions (Addendum)
good diet and exercise habits significanly improve the control of your diabetes.  please let me know if you wish to be referred to a dietician, or for weight-loss surgery.  high blood sugar is very risky to your health.  you should see an eye doctor every year. controlling your blood pressure and cholesterol drastically reduces the damage diabetes does to your body.  this also applies to quitting smoking.  please discuss these with your doctor.  you should take an aspirin every day, unless you have been advised by a doctor not to. check your blood sugar 1 time a day.  vary the time of day when you check, between before the 3 meals, and at bedtime.  also check if you have symptoms of your blood sugar being too high or too low.  please keep a record of the readings and bring it to your next appointment here.  please call us sooner if your blood sugar goes below 70, or if it stays over 200.  blood tests are being requested for you today.  please call [phone] to hear your test results.  You will be prompted to enter the 9-digit "MRN" number that appears at the top left of this page, followed by #.  Then you will hear the message. If your blood sugar is like it was 6 months ago, that is very good, and i would recommend continuing the same metformin.   Please come back for a follow-up appointment in 6 months.
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
## 2011-12-12 ENCOUNTER — Ambulatory Visit: Payer: 59 | Admitting: Neurology

## 2011-12-13 ENCOUNTER — Other Ambulatory Visit: Payer: Self-pay | Admitting: *Deleted

## 2011-12-13 MED ORDER — TOPIRAMATE 25 MG PO TABS: 25.0000 mg | ORAL_TABLET | Freq: Three times a day (TID) | ORAL | Status: DC

## 2011-12-13 MED ORDER — METFORMIN HCL 500 MG PO TABS: 500.0000 mg | ORAL_TABLET | Freq: Every day | ORAL | Status: DC

## 2011-12-13 NOTE — Telephone Encounter (Signed)
Pt's spouse called requesting rx for Topamax and Metformin, rx sent to [facility], pt's spouse informed.

## 2011-12-24 DIAGNOSIS — L0390 Cellulitis, unspecified: Secondary | ICD-10-CM

## 2011-12-24 HISTORY — PX: OTHER SURGICAL HISTORY: SHX169

## 2011-12-24 HISTORY — DX: Cellulitis, unspecified: L03.90

## 2012-01-16 ENCOUNTER — Other Ambulatory Visit: Payer: Self-pay | Admitting: Internal Medicine

## 2012-01-23 ENCOUNTER — Encounter: Payer: Self-pay | Admitting: Internal Medicine

## 2012-02-03 ENCOUNTER — Other Ambulatory Visit: Payer: Self-pay | Admitting: Internal Medicine

## 2012-02-18 ENCOUNTER — Ambulatory Visit (INDEPENDENT_AMBULATORY_CARE_PROVIDER_SITE_OTHER): Payer: 59 | Admitting: Internal Medicine

## 2012-02-18 ENCOUNTER — Encounter: Payer: Self-pay | Admitting: Internal Medicine

## 2012-02-18 DIAGNOSIS — I872 Venous insufficiency (chronic) (peripheral): Secondary | ICD-10-CM

## 2012-02-18 DIAGNOSIS — L299 Pruritus, unspecified: Secondary | ICD-10-CM

## 2012-02-18 DIAGNOSIS — I10 Essential (primary) hypertension: Secondary | ICD-10-CM

## 2012-02-18 MED ORDER — METFORMIN HCL 500 MG PO TABS: 500.0000 mg | ORAL_TABLET | Freq: Every day | ORAL | Status: DC

## 2012-02-18 MED ORDER — CARISOPRODOL 250 MG PO TABS: 250.0000 mg | ORAL_TABLET | Freq: Three times a day (TID) | ORAL | Status: DC

## 2012-02-18 MED ORDER — CELECOXIB 200 MG PO CAPS: 200.0000 mg | ORAL_CAPSULE | Freq: Two times a day (BID) | ORAL | Status: DC

## 2012-02-18 NOTE — Patient Instructions (Signed)
Leg swelling is venous insufficiency - see below  Itching - we had check all the appropriate labs.  Plan - to control itching take generic claritin 10 mg once a day; take generic zantac 150 mg twice a day.  Meds - all refills brought up to date.   Venous Stasis and Chronic Venous Insufficiency As people age, the veins located in their legs may weaken and stretch. When veins weaken and lose the ability to pump blood effectively, the condition is called chronic venous insufficiency (CVI) or venous stasis. Almost all veins return blood back to the heart. This happens by:  The force of the heart pumping fresh blood pushes blood back to the heart.     Blood flowing to the heart from the force of gravity.  In the deep veins of the legs, blood has to fight gravity and flow upstream back to the heart. Here, the leg muscles contract to pump blood back toward the heart. Vein walls are elastic, and many veins have small valves that only allow blood to flow in one direction. When leg muscles contract, they push inward against the elastic vein walls. This squeezes blood upward, opens the valves, and moves blood toward the heart. When leg muscles relax, the vein wall also relaxes and the valves inside the vein close to prevent blood from flowing backward. This method of pumping blood out of the legs is called the venous pump. CAUSES   The venous pump works best while walking and leg muscles are contracting. But when a person sits or stands, blood pressure in leg veins can build. Deep veins are usually able to withstand short periods of inactivity, but long periods of inactivity (and increased pressure) can stretch, weaken, and damage vein walls. High blood pressure can also stretch and damage vein walls. The veins may no longer be able to pump blood back to the heart. Venous hypertension (high blood pressure inside veins) that lasts over time is a primary cause of CVI. CVI can also be caused by:    Deep vein  thrombosis, a condition where a thrombus (blood clot) blocks blood flow in a vein.     Phlebitis, an inflammation of a superficial vein that causes a blood clot to form.  Other risk factors for CVI may include:    Heredity.     Obesity.    Pregnancy.    Sedentary lifestyle.     Smoking.    Jobs requiring long periods of standing or sitting in one place.     Age and gender:     Women in their 46's and 76's and men in their 78's are more prone to developing CVI.  SYMPTOMS   Symptoms of CVI may include:    Varicose veins.     Ulceration or skin breakdown.     Lipodermatosclerosis, a condition that affects the skin just above the ankle, usually on the inside surface.  Over time the skin becomes brown, smooth, tight and often painful. Those with this condition have a high risk of developing skin ulcers.     Reddened or discolored skin on the leg.     Swelling.  DIAGNOSIS  Your caregiver can diagnose CVI after performing a careful medical history and physical examination. To confirm the diagnosis, the following tests may also be ordered:    Duplex ultrasound.     Plethysmography (tests blood flow).     Venograms (x-ray using a special dye).  TREATMENT The goals of treatment for CVI are  to restore a person to an active life and to minimize pain or disability. Typically, CVI does not pose a serious threat to life or limb, and with proper treatment most people with this condition can continue to lead active lives. In most cases, mild CVI can be treated on an outpatient basis with simple procedures. Treatment methods include:    Elastic compression socks.     Sclerotherapy, a procedure involving an injection of a material that "dissolves" the damaged veins. Other veins in the network of blood vessels take over the function of the damaged veins.     Vein stripping (an older procedure less commonly used).     Laser Ablation surgery.     Valve repair.  HOME CARE INSTRUCTIONS      Elastic compression socks must be worn every day. They can help with symptoms and lower the chances of the problem getting worse, but they do not cure the problem.     Only take over-the-counter or prescription medicines for pain, discomfort, or fever as directed by your caregiver.     Your caregiver will review your other medications with you.  SEEK MEDICAL CARE IF:    You are confused about how to take your medications.     There is redness, swelling, or increasing pain in the affected area.     There is a red streak or line that extends up or down from the affected area.     There is a breakdown or loss of skin in the affected area, even if the breakdown is small.     You develop an unexplained oral temperature above 102 F (38.9 C).     There is an injury to the affected area.  SEEK IMMEDIATE MEDICAL CARE IF:    There is an injury and open wound to the affected area.     Pain is not adequately relieved with pain medication prescribed or becomes severe.     An oral temperature above 102 F (38.9 C) develops.     The foot/ankle below the affected area becomes suddenly numb or the area feels weak and hard to move.  MAKE SURE YOU:    Understand these instructions.     Will watch your condition.     Will get help right away if you are not doing well or get worse.  Document Released: 04/14/2007 Document Revised: 08/21/2011 Document Reviewed: 06/22/2007 [facility] Patient Information 2012 [place], [place].

## 2012-02-18 NOTE — Assessment & Plan Note (Signed)
Reviewed mechanism of problem. Provided patient information handout  Plan - elevate legs           Support hose if needed

## 2012-02-18 NOTE — Assessment & Plan Note (Signed)
BP Readings from Last 3 Encounters:  02/18/12 126/80  11/26/11 128/76  09/24/11 142/80   Good control on present meds
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
## 2012-02-18 NOTE — Assessment & Plan Note (Signed)
Chart reviewed - normal thyroid, B12 and liver functions in July '12  Plan - loratadine 10 mg daily           Ranitidine 150 mb bid

## 2012-03-10 ENCOUNTER — Other Ambulatory Visit: Payer: Self-pay | Admitting: Internal Medicine

## 2012-03-19 ENCOUNTER — Encounter: Payer: Self-pay | Admitting: Internal Medicine

## 2012-03-19 ENCOUNTER — Ambulatory Visit (INDEPENDENT_AMBULATORY_CARE_PROVIDER_SITE_OTHER): Payer: 59 | Admitting: Internal Medicine

## 2012-03-19 VITALS — BP 114/72 | HR 73 | Temp 97.1°F | Resp 16 | Ht 68.75 in | Wt 229.1 lb

## 2012-03-19 DIAGNOSIS — M109 Gout, unspecified: Secondary | ICD-10-CM

## 2012-03-19 DIAGNOSIS — M1990 Unspecified osteoarthritis, unspecified site: Secondary | ICD-10-CM

## 2012-03-19 DIAGNOSIS — K219 Gastro-esophageal reflux disease without esophagitis: Secondary | ICD-10-CM

## 2012-03-19 DIAGNOSIS — I10 Essential (primary) hypertension: Secondary | ICD-10-CM

## 2012-03-19 DIAGNOSIS — E785 Hyperlipidemia, unspecified: Secondary | ICD-10-CM

## 2012-03-19 DIAGNOSIS — R413 Other amnesia: Secondary | ICD-10-CM

## 2012-03-19 DIAGNOSIS — Z0000 Encounter for general adult medical examination without abnormal findings: Secondary | ICD-10-CM

## 2012-03-19 DIAGNOSIS — Z125 Encounter for screening for malignant neoplasm of prostate: Secondary | ICD-10-CM

## 2012-03-19 DIAGNOSIS — E119 Type 2 diabetes mellitus without complications: Secondary | ICD-10-CM

## 2012-03-19 MED ORDER — TOPIRAMATE 25 MG PO TABS: 25.0000 mg | ORAL_TABLET | Freq: Three times a day (TID) | ORAL | Status: DC
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
## 2012-03-22 NOTE — Assessment & Plan Note (Signed)
Continues to take allopurinol. No report of gout flares.  Plan - uric acid level.           Continue allopurinol

## 2012-03-22 NOTE — Assessment & Plan Note (Signed)
Lab Results  Component Value Date   HGBA1C 6.1 11/26/2011   Follow up lab ordered, recommendations will follow upon lab results.

## 2012-03-22 NOTE — Assessment & Plan Note (Signed)
BP Readings from Last 3 Encounters:  03/19/12 114/72  02/18/12 126/80  11/26/11 128/76   Very good control on 3 agents: ACE-I, Beta-blocker and CCB.

## 2012-03-22 NOTE — Assessment & Plan Note (Signed)
Interval medical history is unremarkable.Physical exam is normal. He will be returning for routine labs. He is current with colorectal cancer screening. Has opts for prostate screening. Immunizations: due for shingles vaccine.   In summary -  A very nice man who is medically stable at this time.

## 2012-03-22 NOTE — Assessment & Plan Note (Signed)
Remains very functional with no limitations in his activities.

## 2012-03-22 NOTE — Assessment & Plan Note (Signed)
No formal testing done. He does not complain of progressive memory loss at today's visit.

## 2012-03-22 NOTE — Assessment & Plan Note (Signed)
Lab Results  Component Value Date   CHOL 194 07/09/2011   HDL 35.80* 07/09/2011   LDLDIRECT 135.0 07/09/2011   TRIG 219.0* 07/09/2011   CHOLHDL 5 07/09/2011   Was not at goal. New labs ordered and pending with recommendations to follow.

## 2012-03-22 NOTE — Assessment & Plan Note (Signed)
Patient with some GERD symptoms, no dysphagia or odynopahgia, no evidence of bleeding.  Plan - continue protonix.

## 2012-03-24 ENCOUNTER — Other Ambulatory Visit (INDEPENDENT_AMBULATORY_CARE_PROVIDER_SITE_OTHER): Payer: 59

## 2012-03-24 DIAGNOSIS — Z125 Encounter for screening for malignant neoplasm of prostate: Secondary | ICD-10-CM

## 2012-03-24 DIAGNOSIS — E785 Hyperlipidemia, unspecified: Secondary | ICD-10-CM

## 2012-03-24 DIAGNOSIS — M109 Gout, unspecified: Secondary | ICD-10-CM

## 2012-03-24 DIAGNOSIS — E119 Type 2 diabetes mellitus without complications: Secondary | ICD-10-CM

## 2012-03-24 DIAGNOSIS — I10 Essential (primary) hypertension: Secondary | ICD-10-CM

## 2012-03-24 LAB — COMPREHENSIVE METABOLIC PANEL
ALT: 47 U/L (ref 0–53)
BUN: 9 mg/dL (ref 6–23)
CO2: 25 mEq/L (ref 19–32)
Calcium: 9.3 mg/dL (ref 8.4–10.5)
Chloride: 110 mEq/L (ref 96–112)
Creatinine, Ser: 0.9 mg/dL (ref 0.4–1.5)
GFR: 94.52 mL/min (ref >60.00)
Glucose, Bld: 123 mg/dL — ABNORMAL HIGH (ref 70–99)

## 2012-03-24 LAB — LIPID PANEL: Cholesterol: 181 mg/dL (ref 0–200)

## 2012-03-24 LAB — URIC ACID: Uric Acid, Serum: 4 mg/dL (ref 4.0–7.8)

## 2012-03-24 LAB — HEPATIC FUNCTION PANEL
Albumin: 3.7 g/dL (ref 3.5–5.2)
Alkaline Phosphatase: 71 U/L (ref 39–117)
Bilirubin, Direct: 0.1 mg/dL (ref 0.0–0.3)

## 2012-03-24 LAB — HEMOGLOBIN A1C: Hgb A1c MFr Bld: 6.2 % (ref 4.6–6.5)

## 2012-03-29 ENCOUNTER — Encounter: Payer: Self-pay | Admitting: Internal Medicine

## 2012-05-04 ENCOUNTER — Other Ambulatory Visit: Payer: Self-pay | Admitting: Internal Medicine

## 2012-06-04 ENCOUNTER — Ambulatory Visit: Payer: 59 | Admitting: Internal Medicine

## 2012-06-29 ENCOUNTER — Other Ambulatory Visit: Payer: Self-pay | Admitting: Internal Medicine

## 2012-06-30 ENCOUNTER — Encounter: Payer: Self-pay | Admitting: Gastroenterology

## 2012-06-30 NOTE — Telephone Encounter (Signed)
Medication soma called to pharmacy. Patient notified

## 2012-08-03 ENCOUNTER — Telehealth: Payer: Self-pay | Admitting: *Deleted

## 2012-08-03 ENCOUNTER — Ambulatory Visit (AMBULATORY_SURGERY_CENTER): Payer: 59 | Admitting: *Deleted

## 2012-08-03 VITALS — Ht 69.0 in | Wt 233.6 lb

## 2012-08-03 DIAGNOSIS — Z1211 Encounter for screening for malignant neoplasm of colon: Secondary | ICD-10-CM

## 2012-08-03 MED ORDER — MOVIPREP 100 G PO SOLR: ORAL | Status: DC

## 2012-08-03 NOTE — Telephone Encounter (Signed)
Release faxed to Dr Lanae Boast

## 2012-08-03 NOTE — Telephone Encounter (Signed)
Pt scheduled for colonoscopy 08/17/2012 with Dr. Christella Hartigan.  Last colonoscopy 2010 with Dr Lanae Boast in [place], [place].  Pt had polyps (report is in EPIC) but there is no pathology report.  Release of information form signed and given to Chales Abrahams, CMA.

## 2012-08-04 ENCOUNTER — Other Ambulatory Visit: Payer: Self-pay | Admitting: Internal Medicine

## 2012-08-17 ENCOUNTER — Encounter: Payer: Self-pay | Admitting: Gastroenterology

## 2012-08-17 ENCOUNTER — Ambulatory Visit (AMBULATORY_SURGERY_CENTER): Payer: 59 | Admitting: Gastroenterology

## 2012-08-17 VITALS — BP 113/86 | HR 64 | Temp 98.4°F | Resp 16 | Ht 69.0 in | Wt 233.0 lb

## 2012-08-17 DIAGNOSIS — Z860100 Personal history of colon polyps, unspecified: Secondary | ICD-10-CM

## 2012-08-17 DIAGNOSIS — Z1211 Encounter for screening for malignant neoplasm of colon: Secondary | ICD-10-CM

## 2012-08-17 DIAGNOSIS — D126 Benign neoplasm of colon, unspecified: Secondary | ICD-10-CM

## 2012-08-17 MED ORDER — SODIUM CHLORIDE 0.9 % IV SOLN: 500.0000 mL | INTRAVENOUS | Status: DC

## 2012-08-17 NOTE — Op Note (Signed)
[facility] [address] [place], [address]   COLONOSCOPY PROCEDURE REPORT  PATIENT: Garrett, [name]  MR#: [id number] BIRTHDATE: 08/10/1951 , 61  yrs. old GENDER: Male ENDOSCOPIST: Rachael Fee, MD REFERRED XB:[id number] Esther Hardy, M.D. PROCEDURE DATE:  08/17/2012 PROCEDURE:   Colonoscopy with snare polypectomy and Colonoscopy with cold biopsy polypectomy ASA CLASS:   Class II INDICATIONS:history of adenomatous polyps, most recent colonoscopy in [place] with 3 polyps (one was 1cm adenoma). MEDICATIONS: Fentanyl 75 mcg IV, Versed 6 mg IV, and These medications were titrated to patient response per physician's verbal order  DESCRIPTION OF PROCEDURE:   After the risks benefits and alternatives of the procedure were thoroughly explained, informed consent was obtained.  A digital rectal exam revealed no rectal mass.   The LB CF-[id number]  endoscope was introduced through the anus and advanced to the cecum, which was identified by both the appendix and ileocecal valve. No adverse events experienced. The quality of the prep was good.  The instrument was then slowly withdrawn as the colon was fully examined.    COLON FINDINGS: Three sessile polyps were found.  All were remove and sent to pathology (jar 1).  One was in ascending, 2mm across, removed with cold forceps.  One was in descending, 7mm across, removed with snare/cautery.  One was in sigmoid, 4mm acroos, removed with cold snare.   The colon mucosa was otherwise normal. Retroflexed views revealed no abnormalities. The time to cecum=2 minutes 06 seconds  Withdrawal time=10 minutes 48 seconds.  The scope was withdrawn and the procedure completed. COMPLICATIONS: There were no complications.  ENDOSCOPIC IMPRESSION: 1.   Three small polyps were found, removed and sent to pathology. 2.   The colon mucosa was otherwise normal  RECOMMENDATIONS: If the polyp(s) removed today are proven to be  adenomatous (pre-cancerous) polyps, you will need a colonoscopy in 3-5 years. You will receive a letter within 1-2 weeks with the results of your biopsy as well as final recommendations.  Please call my office if you have not received a letter after 3 weeks.   eSigned:  Rachael Fee, MD 08/17/2012 10:34 AM

## 2012-08-17 NOTE — Patient Instructions (Signed)
YOU HAD AN ENDOSCOPIC PROCEDURE TODAY AT THE [facility]: Refer to the procedure report that was given to you for any specific questions about what was found during the examination.  If the procedure report does not answer your questions, please call your gastroenterologist to clarify.  If you requested that your care partner not be given the details of your procedure findings, then the procedure report has been included in a sealed envelope for you to review at your convenience later.  YOU SHOULD EXPECT: Some feelings of bloating in the abdomen. Passage of more gas than usual.  Walking can help get rid of the air that was put into your GI tract during the procedure and reduce the bloating. If you had a lower endoscopy (such as a colonoscopy or flexible sigmoidoscopy) you may notice spotting of blood in your stool or on the toilet paper. If you underwent a bowel prep for your procedure, then you may not have a normal bowel movement for a few days.  DIET: Your first meal following the procedure should be a light meal and then it is ok to progress to your normal diet.  A half-sandwich or bowl of soup is an example of a good first meal.  Heavy or fried foods are harder to digest and may make you feel nauseous or bloated.  Likewise meals heavy in dairy and vegetables can cause extra gas to form and this can also increase the bloating.  Drink plenty of fluids but you should avoid alcoholic beverages for 24 hours.  ACTIVITY: Your care partner should take you home directly after the procedure.  You should plan to take it easy, moving slowly for the rest of the day.  You can resume normal activity the day after the procedure however you should NOT DRIVE or use heavy machinery for 24 hours (because of the sedation medicines used during the test).    SYMPTOMS TO REPORT IMMEDIATELY: A gastroenterologist can be reached at any hour.  During normal business hours, 8:30 AM to 5:00 PM Monday through Friday,  call [phone].  After hours and on weekends, please call the GI answering service at [phone] who will take a message and have the physician on call contact you.   Following lower endoscopy (colonoscopy or flexible sigmoidoscopy):  Excessive amounts of blood in the stool  Significant tenderness or worsening of abdominal pains  Swelling of the abdomen that is new, acute  Fever of 100F or higher   FOLLOW UP: If any biopsies were taken you will be contacted by phone or by letter within the next 1-3 weeks.  Call your gastroenterologist if you have not heard about the biopsies in 3 weeks.  Our staff will call the home number listed on your records the next business day following your procedure to check on you and address any questions or concerns that you may have at that time regarding the information given to you following your procedure. This is a courtesy call and so if there is no answer at the home number and we have not heard from you through the emergency physician on call, we will assume that you have returned to your regular daily activities without incident.  SIGNATURES/CONFIDENTIALITY: You and/or your care partner have signed paperwork which will be entered into your electronic medical record.  These signatures attest to the fact that that the information above on your After Visit Summary has been reviewed and is understood.  Full responsibility of the confidentiality of   this discharge information lies with you and/or your care-partner.   INFORMATION ON POLYPS &HIGH FIBER DIET GIVEN TO YOU TODAY 

## 2012-08-17 NOTE — Progress Notes (Deleted)
Patient did not have preoperative order for IV antibiotic SSI prophylaxis. (G8918)  Patient did not experience any of the following events: a burn prior to discharge; a fall within the facility; wrong site/side/patient/procedure/implant event; or a hospital transfer or hospital admission upon discharge from the facility. (G8907)  

## 2012-08-17 NOTE — Progress Notes (Signed)
Patient did not experience any of the following events: a burn prior to discharge; a fall within the facility; wrong site/side/patient/procedure/implant event; or a hospital transfer or hospital admission upon discharge from the facility. (G8907) Patient did not have preoperative order for IV antibiotic SSI prophylaxis. (G8918)  

## 2012-08-18 ENCOUNTER — Telehealth: Payer: Self-pay | Admitting: *Deleted

## 2012-08-18 NOTE — Telephone Encounter (Signed)
  Follow up Call-  Call back number 08/17/2012  Post procedure Call Back phone  # [phone]  Permission to leave phone message Yes     Patient questions:  Do you have a fever, pain , or abdominal swelling? no Pain Score  0 *  Have you tolerated food without any problems? yes  Have you been able to return to your normal activities? yes  Do you have any questions about your discharge instructions: Diet   no Medications  no Follow up visit  no  Do you have questions or concerns about your Care? no  Actions: * If pain score is 4 or above: No action needed, pain <4.

## 2012-08-21 ENCOUNTER — Encounter: Payer: Self-pay | Admitting: Gastroenterology

## 2012-08-27 ENCOUNTER — Other Ambulatory Visit: Payer: Self-pay | Admitting: Internal Medicine

## 2012-09-03 ENCOUNTER — Other Ambulatory Visit: Payer: Self-pay | Admitting: Endocrinology

## 2012-09-11 ENCOUNTER — Ambulatory Visit (INDEPENDENT_AMBULATORY_CARE_PROVIDER_SITE_OTHER): Payer: 59 | Admitting: Endocrinology

## 2012-09-11 ENCOUNTER — Encounter: Payer: Self-pay | Admitting: Endocrinology

## 2012-09-11 VITALS — BP 132/80 | HR 72 | Temp 97.9°F | Resp 16 | Wt 237.0 lb

## 2012-09-11 DIAGNOSIS — I10 Essential (primary) hypertension: Secondary | ICD-10-CM

## 2012-09-11 MED ORDER — HYDROCHLOROTHIAZIDE 12.5 MG PO TABS: 12.5000 mg | ORAL_TABLET | Freq: Every day | ORAL | Status: DC

## 2012-09-11 NOTE — Patient Instructions (Addendum)
i have sent a prescription to your pharmacy, for an additional blood pressure pill please call if you have any gout problems on this A 24-hour urine test is requested for you today.  You will receive a letter with results. Please see dr Debby Bud for a follow-up appointment in 2 weeks.
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
## 2012-09-15 ENCOUNTER — Other Ambulatory Visit: Payer: 59

## 2012-09-15 DIAGNOSIS — I10 Essential (primary) hypertension: Secondary | ICD-10-CM

## 2012-09-18 ENCOUNTER — Other Ambulatory Visit: Payer: Self-pay | Admitting: Endocrinology

## 2012-09-18 ENCOUNTER — Other Ambulatory Visit: Payer: Self-pay | Admitting: Internal Medicine

## 2012-09-19 LAB — METANEPHRINES, URINE, 24 HOUR: Metanephrines, Ur: 288 mcg/24 h (ref 90–315)

## 2012-09-20 LAB — CATECHOLAMINES, FRACTIONATED, URINE, 24 HOUR
Creatinine, Urine mg/day-CATEUR: 1.95 g/(24.h) (ref 0.63–2.50)
Dopamine, 24 hr Urine: 212 mcg/24 h (ref 52–480)

## 2012-09-21 ENCOUNTER — Encounter: Payer: Self-pay | Admitting: Endocrinology

## 2012-09-30 ENCOUNTER — Encounter: Payer: Self-pay | Admitting: Internal Medicine

## 2012-09-30 ENCOUNTER — Ambulatory Visit (INDEPENDENT_AMBULATORY_CARE_PROVIDER_SITE_OTHER): Payer: 59 | Admitting: Internal Medicine

## 2012-09-30 VITALS — BP 122/68 | HR 65 | Temp 97.0°F | Resp 16 | Wt 234.0 lb

## 2012-09-30 DIAGNOSIS — I10 Essential (primary) hypertension: Secondary | ICD-10-CM

## 2012-09-30 DIAGNOSIS — Z23 Encounter for immunization: Secondary | ICD-10-CM

## 2012-09-30 DIAGNOSIS — H8309 Labyrinthitis, unspecified ear: Secondary | ICD-10-CM

## 2012-09-30 MED ORDER — FUROSEMIDE 20 MG PO TABS: 20.0000 mg | ORAL_TABLET | Freq: Every day | ORAL | Status: DC

## 2012-09-30 NOTE — Patient Instructions (Addendum)
Headache - right side of you head is most likely caused by mild to moderate inflammation of the TMJ.  Plan 1. Check with your dentist about having a aligned bite or signs of bruxism (grinding your teeth)  2. Use a rub, e.g. Aspercreme, icy-hot etc over the external aspect of the joint  3. You may take ibuprofen or naproxen sodium for the pain.  Positional vertigo - this is a problem with the pressure sensor in the carotid ( carotid dysautonomia):  Plan - "rule of 20."  Inner ear - you have mild dizziness that is associated with turning of the head (labyrinthitis). As long as this is minor - no treatment  Chest pain - NOT angina - sounds more like flare of heartburn. Try taking 15-30 cc liquid antacid for immediate relief.  Temporomandibular Problems   Temporomandibular joint (TMJ) dysfunction means there are problems with the joint between your jaw and your skull. This is a joint lined by cartilage like other joints in your body but also has a small disc in the joint which keeps the bones from rubbing on each other. These joints are like other joints and can get inflamed (sore) from arthritis and other problems. When this joint gets sore, it can cause headaches and pain in the jaw and the face. CAUSES   Usually the arthritic types of problems are caused by soreness in the joint. Soreness in the joint can also be caused by overuse. This may come from grinding your teeth. It may also come from mis-alignment in the joint. DIAGNOSIS Diagnosis of this condition can often be made by history and exam. Sometimes your caregiver may need X-rays or an MRI scan to determine the exact cause. It may be necessary to see your dentist to determine if your teeth and jaws are lined up correctly. TREATMENT   Most of the time this problem is not serious; however, sometimes it can persist (become chronic). When this happens medications that will cut down on inflammation (soreness) help. Sometimes a shot of cortisone  into the joint will be helpful. If your teeth are not aligned it may help for your dentist to make a splint for your mouth that can help this problem. If no physical problems can be found, the problem may come from tension. If tension is found to be the cause, biofeedback or relaxation techniques may be helpful. HOME CARE INSTRUCTIONS    Later in the day, applications of ice packs may be helpful. Ice can be used in a plastic bag with a towel around it to prevent frostbite to skin. This may be used about every 2 hours for 20 to 30 minutes, as needed while awake, or as directed by your caregiver.   Only take over-the-counter or prescription medicines for pain, discomfort, or fever as directed by your caregiver.   If physical therapy was prescribed, follow your caregiver's directions.   Wear mouth appliances as directed if they were given.  Document Released: 09/03/2001 Document Revised: 03/02/2012 Document Reviewed: 12/11/2008 [facility] Patient Information 2013 [place], [place].   Benign Positional Vertigo Vertigo means you feel like you or your surroundings are moving when they are not. Benign positional vertigo is the most common form of vertigo. Benign means that the cause of your condition is not serious. Benign positional vertigo is more common in older adults. CAUSES   Benign positional vertigo is the result of an upset in the labyrinth system. This is an area in the middle ear that helps control your  balance. This may be caused by a viral infection, head injury, or repetitive motion. However, often no specific cause is found. SYMPTOMS   Symptoms of benign positional vertigo occur when you move your head or eyes in different directions. Some of the symptoms may include:  Loss of balance and falls.   Vomiting.   Blurred vision.   Dizziness.   Nausea.   Involuntary eye movements (nystagmus).  DIAGNOSIS   Benign positional vertigo is usually diagnosed by physical exam. If the specific  cause of your benign positional vertigo is unknown, your caregiver may perform imaging tests, such as magnetic resonance imaging (MRI) or computed tomography (CT). TREATMENT   Your caregiver may recommend movements or procedures to correct the benign positional vertigo. Medicines such as meclizine, benzodiazepines, and medicines for nausea may be used to treat your symptoms. In rare cases, if your symptoms are caused by certain conditions that affect the inner ear, you may need surgery. HOME CARE INSTRUCTIONS    Follow your caregiver's instructions.   Move slowly. Do not make sudden body or head movements.   Avoid driving.   Avoid operating heavy machinery.   Avoid performing any tasks that would be dangerous to you or others during a vertigo episode.   Drink enough fluids to keep your urine clear or pale yellow.  SEEK IMMEDIATE MEDICAL CARE IF:    You develop problems with walking, weakness, numbness, or using your arms, hands, or legs.   You have difficulty speaking.   You develop severe headaches.   Your nausea or vomiting continues or gets worse.   You develop visual changes.   Your family or friends notice any behavioral changes.   Your condition gets worse.   You have a fever.   You develop a stiff neck or sensitivity to light.  MAKE SURE YOU:    Understand these instructions.   Will watch your condition.   Will get help right away if you are not doing well or get worse.  Document Released: 09/16/2006 Document Revised: 03/02/2012 Document Reviewed: 08/29/2011 [facility] Patient Information 2013 [place], [place].     Labyrinthitis (Inner Ear Inflammation) Your exam shows you have an inner ear disturbance or labyrinthitis. The cause of this condition is not known. But it may be due to a virus infection. The symptoms of labyrinthitis include vertigo or dizziness made worse by motion, nausea and vomiting. The onset of labyrinthitis may be very sudden. It usually lasts  for a few days and then clears up over 1-2 weeks. The treatment of an inner ear disturbance includes bed rest and medications to reduce dizziness, nausea, and vomiting. You should stay away from alcohol, tranquilizers, caffeine, nicotine, or any medicine your doctor thinks may make your symptoms worse. Further testing may be needed to evaluate your hearing and balance system. Please see your doctor or go to the emergency room right away if you have:  Increasing vertigo, earache, loss of hearing, or ear drainage.   Headache, blurred vision, trouble walking, fainting, or fever.   Persistent vomiting, dehydration, or extreme weakness.  Document Released: 12/09/2005 Document Revised: 03/02/2012 Document Reviewed: 05/27/2007 [facility] Patient Information 2013 [place], [place].      Gastroesophageal Reflux Disease, Adult Gastroesophageal reflux disease (GERD) happens when acid from your stomach flows up into the esophagus. When acid comes in contact with the esophagus, the acid causes soreness (inflammation) in the esophagus. Over time, GERD may create small holes (ulcers) in the lining of the esophagus. CAUSES  Increased body weight. This puts pressure on the stomach, making acid rise from the stomach into the esophagus.   Smoking. This increases acid production in the stomach.   Drinking alcohol. This causes decreased pressure in the lower esophageal sphincter (valve or ring of muscle between the esophagus and stomach), allowing acid from the stomach into the esophagus.   Late evening meals and a full stomach. This increases pressure and acid production in the stomach.   A malformed lower esophageal sphincter.  Sometimes, no cause is found. SYMPTOMS    Burning pain in the lower part of the mid-chest behind the breastbone and in the mid-stomach area. This may occur twice a week or more often.   Trouble swallowing.   Sore throat.   Dry cough.   Asthma-like symptoms including chest  tightness, shortness of breath, or wheezing.  DIAGNOSIS   Your caregiver may be able to diagnose GERD based on your symptoms. In some cases, X-rays and other tests may be done to check for complications or to check the condition of your stomach and esophagus. TREATMENT   Your caregiver may recommend over-the-counter or prescription medicines to help decrease acid production. Ask your caregiver before starting or adding any new medicines.   HOME CARE INSTRUCTIONS    Change the factors that you can control. Ask your caregiver for guidance concerning weight loss, quitting smoking, and alcohol consumption.   Avoid foods and drinks that make your symptoms worse, such as:   Caffeine or alcoholic drinks.   Chocolate.   Peppermint or mint flavorings.   Garlic and onions.   Spicy foods.   Citrus fruits, such as oranges, lemons, or limes.   Tomato-based foods such as sauce, chili, salsa, and pizza.   Fried and fatty foods.   Avoid lying down for the 3 hours prior to your bedtime or prior to taking a nap.   Eat small, frequent meals instead of large meals.   Wear loose-fitting clothing. Do not wear anything tight around your waist that causes pressure on your stomach.   Raise the head of your bed 6 to 8 inches with wood blocks to help you sleep. Extra pillows will not help.   Only take over-the-counter or prescription medicines for pain, discomfort, or fever as directed by your caregiver.   Do not take aspirin, ibuprofen, or other nonsteroidal anti-inflammatory drugs (NSAIDs).  SEEK IMMEDIATE MEDICAL CARE IF:    You have pain in your arms, neck, jaw, teeth, or back.   Your pain increases or changes in intensity or duration.   You develop nausea, vomiting, or sweating (diaphoresis).   You develop shortness of breath, or you faint.   Your vomit is green, yellow, black, or looks like coffee grounds or blood.   Your stool is red, bloody, or black.  These symptoms could be signs of  other problems, such as heart disease, gastric bleeding, or esophageal bleeding. MAKE SURE YOU:    Understand these instructions.   Will watch your condition.   Will get help right away if you are not doing well or get worse.  Document Released: 09/18/2005 Document Revised: 03/02/2012 Document Reviewed: 06/28/2011 [facility] Patient Information 2013 [place], [place].
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
## 2012-10-07 NOTE — Assessment & Plan Note (Signed)
BP Readings from Last 3 Encounters:  09/30/12 122/68  09/11/12 132/80  08/17/12 113/86   Good control on present medications - will refill lasix.

## 2012-10-20 ENCOUNTER — Other Ambulatory Visit: Payer: Self-pay | Admitting: Internal Medicine

## 2012-10-29 ENCOUNTER — Other Ambulatory Visit: Payer: Self-pay | Admitting: Internal Medicine

## 2012-11-05 ENCOUNTER — Other Ambulatory Visit: Payer: Self-pay | Admitting: Internal Medicine

## 2012-11-30 ENCOUNTER — Other Ambulatory Visit: Payer: Self-pay | Admitting: Internal Medicine

## 2012-12-01 ENCOUNTER — Other Ambulatory Visit: Payer: Self-pay | Admitting: *Deleted

## 2012-12-01 MED ORDER — CARISOPRODOL 250 MG PO TABS: 250.0000 mg | ORAL_TABLET | Freq: Three times a day (TID) | ORAL | Status: DC | PRN

## 2012-12-04 ENCOUNTER — Other Ambulatory Visit: Payer: Self-pay | Admitting: *Deleted

## 2012-12-05 MED ORDER — CARISOPRODOL 250 MG PO TABS: 250.0000 mg | ORAL_TABLET | Freq: Three times a day (TID) | ORAL | Status: DC | PRN

## 2012-12-08 ENCOUNTER — Other Ambulatory Visit: Payer: Self-pay | Admitting: Internal Medicine

## 2012-12-09 ENCOUNTER — Other Ambulatory Visit: Payer: Self-pay | Admitting: Internal Medicine

## 2012-12-09 ENCOUNTER — Other Ambulatory Visit: Payer: Self-pay | Admitting: *Deleted

## 2012-12-11 ENCOUNTER — Ambulatory Visit (INDEPENDENT_AMBULATORY_CARE_PROVIDER_SITE_OTHER): Payer: 59 | Admitting: Internal Medicine

## 2012-12-11 ENCOUNTER — Encounter: Payer: Self-pay | Admitting: Internal Medicine

## 2012-12-11 ENCOUNTER — Ambulatory Visit (INDEPENDENT_AMBULATORY_CARE_PROVIDER_SITE_OTHER)
Admission: RE | Admit: 2012-12-11 | Discharge: 2012-12-11 | Disposition: A | Discharge status: Home / Self Care | Payer: 59 | Source: Ambulatory Visit | Attending: Internal Medicine | Admitting: Internal Medicine

## 2012-12-11 VITALS — BP 110/72 | HR 68 | Temp 97.9°F | Ht 68.75 in

## 2012-12-11 DIAGNOSIS — R0789 Other chest pain: Secondary | ICD-10-CM

## 2012-12-11 DIAGNOSIS — R0989 Other specified symptoms and signs involving the circulatory and respiratory systems: Secondary | ICD-10-CM

## 2012-12-11 DIAGNOSIS — I10 Essential (primary) hypertension: Secondary | ICD-10-CM

## 2012-12-11 DIAGNOSIS — E119 Type 2 diabetes mellitus without complications: Secondary | ICD-10-CM

## 2012-12-11 DIAGNOSIS — R0600 Dyspnea, unspecified: Secondary | ICD-10-CM

## 2012-12-11 DIAGNOSIS — R0609 Other forms of dyspnea: Secondary | ICD-10-CM

## 2012-12-11 MED ORDER — PREDNISONE (PAK) 10 MG PO TABS: 10.0000 mg | ORAL_TABLET | ORAL | Status: DC

## 2012-12-11 NOTE — Assessment & Plan Note (Signed)
On metformin  Lab Results  Component Value Date   HGBA1C 6.2 03/24/2012   

## 2012-12-11 NOTE — Patient Instructions (Signed)
It was good to see you today. We have reviewed your prior records including labs and tests today EKG today ok - no evidence for heart problems Chest xray ordered today. Your results will be released to MyChart (or called to you) after review, usually within 72hours after test completion. If any changes need to be made, you will be notified at that same time. If you develop worsening symptoms or fever, call and we can reconsider antibiotics, but it does not appear necessary to use antibiotics at this time. Pred taper x 6 days - Your prescription(s) have been submitted to your pharmacy. Please take as directed and contact our office if you believe you are having problem(s) with the medication(s).

## 2012-12-11 NOTE — Assessment & Plan Note (Signed)
BP Readings from Last 3 Encounters:  12/11/12 110/72  09/30/12 122/68  09/11/12 132/80   The current medical regimen is effective;  continue present plan and medications.
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
## 2012-12-14 ENCOUNTER — Other Ambulatory Visit (INDEPENDENT_AMBULATORY_CARE_PROVIDER_SITE_OTHER): Payer: 59

## 2012-12-14 ENCOUNTER — Encounter: Payer: Self-pay | Admitting: Internal Medicine

## 2012-12-14 ENCOUNTER — Ambulatory Visit (INDEPENDENT_AMBULATORY_CARE_PROVIDER_SITE_OTHER): Payer: 59 | Admitting: Internal Medicine

## 2012-12-14 VITALS — BP 120/72 | HR 71 | Temp 98.0°F

## 2012-12-14 DIAGNOSIS — R0789 Other chest pain: Secondary | ICD-10-CM

## 2012-12-14 DIAGNOSIS — R0602 Shortness of breath: Secondary | ICD-10-CM

## 2012-12-14 DIAGNOSIS — J329 Chronic sinusitis, unspecified: Secondary | ICD-10-CM

## 2012-12-14 DIAGNOSIS — E119 Type 2 diabetes mellitus without complications: Secondary | ICD-10-CM

## 2012-12-14 DIAGNOSIS — IMO0001 Reserved for inherently not codable concepts without codable children: Secondary | ICD-10-CM

## 2012-12-14 LAB — CBC WITH DIFFERENTIAL/PLATELET
Basophils Absolute: 0 10*3/uL (ref 0.0–0.1)
Eosinophils Relative: 0 % (ref 0.0–5.0)
Lymphocytes Relative: 17.2 % (ref 12.0–46.0)
Monocytes Relative: 7.9 % (ref 3.0–12.0)
Neutrophils Relative %: 74.7 % (ref 43.0–77.0)
Platelets: 306 10*3/uL (ref 150.0–400.0)
RDW: 14.3 % (ref 11.5–14.6)
WBC: 15.6 10*3/uL — ABNORMAL HIGH (ref 4.5–10.5)

## 2012-12-14 LAB — HEMOGLOBIN A1C: Hgb A1c MFr Bld: 6.8 % — ABNORMAL HIGH (ref 4.6–6.5)

## 2012-12-14 MED ORDER — AZITHROMYCIN 250 MG PO TABS: ORAL_TABLET | ORAL | Status: DC

## 2012-12-14 MED ORDER — FUROSEMIDE 20 MG PO TABS: 20.0000 mg | ORAL_TABLET | Freq: Every day | ORAL | Status: DC

## 2012-12-14 NOTE — Patient Instructions (Addendum)
It was good to see you today. We have reviewed your prior records including labs and tests today Lab tests ordered today. Your results will be released to MyChart (or called to you) after review, usually within 72hours after test completion. If any changes need to be made, you will be notified at that same time. Zpak antibiotics x 5 days and increase furosemide to 40mg  daily x 3 days (start tomorrow) then resume 20mg  daily as ongoing -  Your prescription(s) have been submitted to your pharmacy. Please take as directed and contact our office if you believe you are having problem(s) with the medication(s). Alternate between ibuprofen and tylenol for aches, pain and fever symptoms as discussed Hydrate, rest and call if worse or unimproved

## 2012-12-14 NOTE — Assessment & Plan Note (Signed)
On metformin  Lab Results  Component Value Date   HGBA1C 6.2 03/24/2012
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
## 2012-12-15 LAB — BASIC METABOLIC PANEL
BUN: 18 mg/dL (ref 6–23)
Calcium: 9.3 mg/dL (ref 8.4–10.5)
GFR: 79.38 mL/min (ref >60.00)
Glucose, Bld: 163 mg/dL — ABNORMAL HIGH (ref 70–99)
Potassium: 4.1 mEq/L (ref 3.5–5.1)
Sodium: 140 mEq/L (ref 135–145)

## 2012-12-15 LAB — HEPATIC FUNCTION PANEL
ALT: 51 U/L (ref 0–53)
AST: 23 U/L (ref 0–37)
Bilirubin, Direct: 0.1 mg/dL (ref 0.0–0.3)
Total Bilirubin: 0.4 mg/dL (ref 0.3–1.2)
Total Protein: 6.8 g/dL (ref 6.0–8.3)

## 2012-12-15 LAB — TSH: TSH: 1.3 u[IU]/mL (ref 0.35–5.50)

## 2013-03-08 ENCOUNTER — Other Ambulatory Visit: Payer: Self-pay | Admitting: Internal Medicine

## 2013-03-16 ENCOUNTER — Other Ambulatory Visit: Payer: Self-pay | Admitting: Internal Medicine

## 2013-03-23 ENCOUNTER — Ambulatory Visit (INDEPENDENT_AMBULATORY_CARE_PROVIDER_SITE_OTHER): Payer: 59 | Admitting: Internal Medicine

## 2013-03-23 ENCOUNTER — Encounter: Payer: Self-pay | Admitting: Internal Medicine

## 2013-03-23 ENCOUNTER — Other Ambulatory Visit (INDEPENDENT_AMBULATORY_CARE_PROVIDER_SITE_OTHER): Payer: 59

## 2013-03-23 VITALS — BP 138/84 | HR 66 | Temp 97.7°F | Resp 16 | Ht 69.0 in | Wt 233.0 lb

## 2013-03-23 DIAGNOSIS — E119 Type 2 diabetes mellitus without complications: Secondary | ICD-10-CM

## 2013-03-23 DIAGNOSIS — I10 Essential (primary) hypertension: Secondary | ICD-10-CM

## 2013-03-23 DIAGNOSIS — R972 Elevated prostate specific antigen [PSA]: Secondary | ICD-10-CM

## 2013-03-23 DIAGNOSIS — R413 Other amnesia: Secondary | ICD-10-CM

## 2013-03-23 DIAGNOSIS — Z91030 Bee allergy status: Secondary | ICD-10-CM

## 2013-03-23 DIAGNOSIS — M1990 Unspecified osteoarthritis, unspecified site: Secondary | ICD-10-CM

## 2013-03-23 DIAGNOSIS — G43009 Migraine without aura, not intractable, without status migrainosus: Secondary | ICD-10-CM

## 2013-03-23 DIAGNOSIS — E785 Hyperlipidemia, unspecified: Secondary | ICD-10-CM

## 2013-03-23 DIAGNOSIS — J309 Allergic rhinitis, unspecified: Secondary | ICD-10-CM

## 2013-03-23 DIAGNOSIS — Z0000 Encounter for general adult medical examination without abnormal findings: Secondary | ICD-10-CM

## 2013-03-23 DIAGNOSIS — M109 Gout, unspecified: Secondary | ICD-10-CM

## 2013-03-23 NOTE — Assessment & Plan Note (Signed)
Garrett [name] continues to complain of progressive memory loss although minor. He reports failing anticholinesterase inhibitor in the past due to low BP.  He is not ready to commit to long term medical management, i.e. Namenda.

## 2013-03-23 NOTE — Assessment & Plan Note (Signed)
On no medications and last LDL 118.4  Plan Life-style management with low fat diet and regular exercise.   LDL goal = 100 or less  Repeat labs - July '14

## 2013-03-23 NOTE — Assessment & Plan Note (Signed)
He has had increased pain with the cold winter. His use of hydrocodone is variable based on the amount of pain he has. No issues re: prescribing or refills. He also uses celebrex on a regular basis.   Plan Continue movement exercise  OK with current medical regimen
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
## 2013-03-23 NOTE — Assessment & Plan Note (Signed)
Asymptomatic patient with a rising PSA at a rate greater than threshold of 0.7 units/12 month.  Plan Urology referral to [facility]

## 2013-03-23 NOTE — Assessment & Plan Note (Signed)
Lab Results  Component Value Date   HGBA1C 6.8* 12/14/2012   atient reports he is doing well with good CBG control.  Plan  Continue present regimen  Repeat A1c July '14

## 2013-03-23 NOTE — Assessment & Plan Note (Signed)
No report of any recent flares.  PLan Continue allopurinol  Continue low purine diet.

## 2013-03-23 NOTE — Assessment & Plan Note (Signed)
BP Readings from Last 3 Encounters:  03/23/13 138/84  12/14/12 120/72  12/11/12 110/72   Good control on present regimen. Last lab: normal renal function and electrolytes  Plan Continue present medications  Routine lab in 3 months

## 2013-03-23 NOTE — Assessment & Plan Note (Signed)
Has not had any recent events. Carries EpiPen for as needed use.

## 2013-03-23 NOTE — Assessment & Plan Note (Addendum)
Use nasal inhalational steroids as needed - routinely during his worst seasons.

## 2013-03-23 NOTE — Patient Instructions (Addendum)
Thanks for coming to see me.  All things considered you are doing pretty well. You may want to consider hip replacement.  For cochlear implants - Dr. Ermalinda Barrios  [phone]  Will check PSA today.   Please sign up for MyChart - a better way to communicate.

## 2013-03-23 NOTE — Assessment & Plan Note (Signed)
Interval medical history - unremarkable with no major medical illness or hospitalization, surgery or injury. Physical exam is normal. He is current with colorectal cancer screening. Prostate cancer screening: PSA rising: '12  0.85, '13  2.13,  '14  5.77. Immunizations - due for pneumonia vaccine and shingles vaccine.  In summary - a very nice man who is managing his medical problems well who now has a rising PSA. He will return for follow up lab in July '14.  He will return as needed per labs and GU work-up.

## 2013-03-23 NOTE — Assessment & Plan Note (Signed)
Relatively stable - no change in frequency or intensity of headaches  Plan No change in medications

## 2013-03-24 ENCOUNTER — Encounter: Payer: Self-pay | Admitting: Internal Medicine

## 2013-04-07 ENCOUNTER — Other Ambulatory Visit: Payer: Self-pay | Admitting: Internal Medicine

## 2013-04-07 NOTE — Telephone Encounter (Signed)
Soma called to pharmacy

## 2013-05-03 ENCOUNTER — Other Ambulatory Visit: Payer: Self-pay | Admitting: Internal Medicine

## 2013-05-03 NOTE — Telephone Encounter (Signed)
Ok to refill 30d, per protocol covering for absent PCP

## 2013-05-03 NOTE — Telephone Encounter (Signed)
RX faxed to [phone] [facility]

## 2013-05-07 NOTE — Telephone Encounter (Signed)
Faxed script back to [facility]...lmb 

## 2013-05-07 NOTE — Telephone Encounter (Signed)
Done hardcopy to [facility]  

## 2013-05-07 NOTE — Telephone Encounter (Signed)
MD out of office. Is this ok to refill.../lmb 

## 2013-05-24 ENCOUNTER — Other Ambulatory Visit: Payer: Self-pay | Admitting: Internal Medicine

## 2013-05-27 ENCOUNTER — Telehealth: Payer: Self-pay | Admitting: Internal Medicine

## 2013-05-27 NOTE — Telephone Encounter (Signed)
Pt is requesting to switch PCP from Dr. Debby Bud to Dr. Felicity Coyer.  Will this be OK?

## 2013-05-27 NOTE — Telephone Encounter (Signed)
Ok thanks 

## 2013-05-27 NOTE — Telephone Encounter (Signed)
OK by me 

## 2013-05-28 NOTE — Telephone Encounter (Signed)
Pt's wife is aware.  Not appt request at this time.

## 2013-06-07 ENCOUNTER — Other Ambulatory Visit: Payer: Self-pay | Admitting: Internal Medicine

## 2013-06-07 NOTE — Telephone Encounter (Signed)
Rx faxed to [facility]  

## 2013-06-14 ENCOUNTER — Other Ambulatory Visit: Payer: Self-pay | Admitting: Internal Medicine

## 2013-06-22 ENCOUNTER — Other Ambulatory Visit: Payer: Self-pay | Admitting: Internal Medicine

## 2013-07-01 ENCOUNTER — Encounter: Payer: Self-pay | Admitting: Internal Medicine

## 2013-07-01 ENCOUNTER — Telehealth: Payer: Self-pay | Admitting: *Deleted

## 2013-07-01 ENCOUNTER — Ambulatory Visit (INDEPENDENT_AMBULATORY_CARE_PROVIDER_SITE_OTHER): Payer: 59 | Admitting: Internal Medicine

## 2013-07-01 ENCOUNTER — Other Ambulatory Visit (INDEPENDENT_AMBULATORY_CARE_PROVIDER_SITE_OTHER): Payer: 59

## 2013-07-01 VITALS — BP 138/82 | HR 70 | Temp 97.3°F | Wt 233.0 lb

## 2013-07-01 DIAGNOSIS — I10 Essential (primary) hypertension: Secondary | ICD-10-CM

## 2013-07-01 DIAGNOSIS — E119 Type 2 diabetes mellitus without complications: Secondary | ICD-10-CM

## 2013-07-01 DIAGNOSIS — F03A0 Unspecified dementia, mild, without behavioral disturbance, psychotic disturbance, mood disturbance, and anxiety: Secondary | ICD-10-CM

## 2013-07-01 DIAGNOSIS — F0390 Unspecified dementia without behavioral disturbance: Secondary | ICD-10-CM

## 2013-07-01 LAB — HEMOGLOBIN A1C: Hgb A1c MFr Bld: 6.8 % — ABNORMAL HIGH (ref 4.6–6.5)

## 2013-07-01 LAB — LIPID PANEL: Cholesterol: 188 mg/dL (ref 0–200)

## 2013-07-01 LAB — BASIC METABOLIC PANEL
BUN: 15 mg/dL (ref 6–23)
Calcium: 9.5 mg/dL (ref 8.4–10.5)
GFR: 74.14 mL/min (ref >60.00)
Glucose, Bld: 159 mg/dL — ABNORMAL HIGH (ref 70–99)
Potassium: 4.1 mEq/L (ref 3.5–5.1)

## 2013-07-01 LAB — MICROALBUMIN / CREATININE URINE RATIO: Microalb Creat Ratio: 0.3 mg/g (ref 0.0–30.0)

## 2013-07-01 MED ORDER — PANTOPRAZOLE SODIUM 40 MG PO TBEC: DELAYED_RELEASE_TABLET | ORAL | Status: DC

## 2013-07-01 MED ORDER — PRAVASTATIN SODIUM 20 MG PO TABS: 20.0000 mg | ORAL_TABLET | Freq: Every day | ORAL | Status: DC

## 2013-07-01 MED ORDER — CELECOXIB 200 MG PO CAPS: ORAL_CAPSULE | ORAL | Status: DC

## 2013-07-01 MED ORDER — ALLOPURINOL 300 MG PO TABS: ORAL_TABLET | ORAL | Status: DC

## 2013-07-01 MED ORDER — MEMANTINE HCL ER 28 MG PO CP24: 1.0000 | ORAL_CAPSULE | Freq: Every day | ORAL | Status: DC

## 2013-07-01 MED ORDER — FUROSEMIDE 20 MG PO TABS: ORAL_TABLET | ORAL | Status: DC

## 2013-07-01 MED ORDER — RAMIPRIL 10 MG PO CAPS: ORAL_CAPSULE | ORAL | Status: DC

## 2013-07-01 MED ORDER — TOPIRAMATE 25 MG PO TABS: ORAL_TABLET | ORAL | Status: DC

## 2013-07-01 MED ORDER — MOMETASONE FUROATE 50 MCG/ACT NA SUSP: NASAL | Status: DC

## 2013-07-01 MED ORDER — VERAPAMIL HCL ER 240 MG PO TBCR: EXTENDED_RELEASE_TABLET | ORAL | Status: DC

## 2013-07-01 MED ORDER — TERAZOSIN HCL 5 MG PO CAPS: 5.0000 mg | ORAL_CAPSULE | Freq: Every day | ORAL | Status: DC

## 2013-07-01 MED ORDER — MEMANTINE HCL ER 7 & 14 & 21 &28 MG PO CP24: 1.0000 | ORAL_CAPSULE | Freq: Every day | ORAL | Status: DC

## 2013-07-01 MED ORDER — SUMATRIPTAN SUCCINATE 50 MG PO TABS: 50.0000 mg | ORAL_TABLET | ORAL | Status: DC | PRN

## 2013-07-01 MED ORDER — MEMANTINE HCL 5 MG PO TABS: 5.0000 mg | ORAL_TABLET | Freq: Two times a day (BID) | ORAL | Status: DC

## 2013-07-01 NOTE — Addendum Note (Signed)
Addended by: Rene Paci A on: 07/01/2013 06:27 PM   Modules accepted: Orders
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
## 2013-07-01 NOTE — Telephone Encounter (Signed)
Rx changed by MD

## 2013-07-01 NOTE — Telephone Encounter (Signed)
Called requesting clarification on the Namenda titration pack.  Requesting whether the Rx was meant to be titration pack. If so what strength needed for second month.  Please advise

## 2013-07-01 NOTE — Assessment & Plan Note (Signed)
Continues to note mild short term memory loss Prior Aricept trial caused low BP and "irritability" per family Requests trial of namenda we reviewed potential risk/benefit and possible side effects - pt understands and agrees to same  rx done

## 2013-07-01 NOTE — Assessment & Plan Note (Signed)
BP Readings from Last 3 Encounters:  07/01/13 138/82  03/23/13 138/84  12/14/12 120/72   Requests trial hytrin (may also help known BPH symptoms) - erx done

## 2013-07-01 NOTE — Telephone Encounter (Signed)
Received a Rx for Amenda 5mg ; medication is to be discontinued with in the next couple of months.  Requesting Amenda XR instead.  Please advise

## 2013-07-01 NOTE — Assessment & Plan Note (Signed)
On metformin Check a1c and adjust tx as needed On ACEI, ASA 81 -  Add statin if LDL >100 The patient is asked to make an attempt to improve diet and exercise patterns to aid in medical management of this problem.  Lab Results  Component Value Date   HGBA1C 6.8* 12/14/2012

## 2013-07-01 NOTE — Telephone Encounter (Signed)
Thanks - rx changed to XR

## 2013-07-01 NOTE — Patient Instructions (Signed)
It was good to see you today. We have reviewed your prior records including labs and tests today Test(s) ordered today. Your results will be released to MyChart (or called to you) after review, usually within 72hours after test completion. If any changes need to be made, you will be notified at that same time. Medications reviewed and updated, Hytrin once daily for blood pressure and prostate and namenda 2x/day for memory Your prescription(s) have been submitted to your pharmacy. Please take as directed and contact our office if you believe you are having problem(s) with the medication(s). If LDL over 100, will also start low dose statin for cholesterol Please schedule followup in 4 months for diabetes mellitus check, call sooner if problems.

## 2013-07-01 NOTE — Telephone Encounter (Signed)
For 2nd month, will remain on XR 28mg  - I can send rx now, to be filled next month

## 2013-07-02 NOTE — Telephone Encounter (Signed)
Spoke with Pharmacist advised of MDs order.

## 2013-07-12 ENCOUNTER — Emergency Department
Admission: EM | Admit: 2013-07-12 | Discharge: 2013-07-12 | Disposition: A | Discharge status: Home / Self Care | Payer: 59 | Source: Home / Self Care | Attending: Family Medicine | Admitting: Family Medicine

## 2013-07-12 ENCOUNTER — Emergency Department (INDEPENDENT_AMBULATORY_CARE_PROVIDER_SITE_OTHER): Payer: 59

## 2013-07-12 ENCOUNTER — Encounter: Payer: Self-pay | Admitting: Emergency Medicine

## 2013-07-12 DIAGNOSIS — S29012A Strain of muscle and tendon of back wall of thorax, initial encounter: Secondary | ICD-10-CM

## 2013-07-12 DIAGNOSIS — R079 Chest pain, unspecified: Secondary | ICD-10-CM

## 2013-07-12 DIAGNOSIS — R0602 Shortness of breath: Secondary | ICD-10-CM

## 2013-07-12 DIAGNOSIS — S239XXA Sprain of unspecified parts of thorax, initial encounter: Secondary | ICD-10-CM

## 2013-07-12 DIAGNOSIS — IMO0002 Reserved for concepts with insufficient information to code with codable children: Secondary | ICD-10-CM

## 2013-07-12 DIAGNOSIS — S29011A Strain of muscle and tendon of front wall of thorax, initial encounter: Secondary | ICD-10-CM

## 2013-07-12 LAB — POCT CBC W AUTO DIFF (K'VILLE URGENT CARE)
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
## 2013-07-12 NOTE — ED Notes (Signed)
Chest pain, SOB x 1 week

## 2013-07-13 ENCOUNTER — Other Ambulatory Visit: Payer: Self-pay | Admitting: Internal Medicine

## 2013-07-17 ENCOUNTER — Telehealth: Payer: Self-pay | Admitting: Family Medicine

## 2013-08-11 ENCOUNTER — Other Ambulatory Visit: Payer: Self-pay | Admitting: Internal Medicine

## 2013-09-02 ENCOUNTER — Telehealth: Payer: Self-pay | Admitting: *Deleted

## 2013-09-02 NOTE — Telephone Encounter (Signed)
Monica, Pharmacist called states she is unable to get Namenda XR 28mg  due to [employer].  requesting to change rx to Namenda IR 10mg  BID.  Please advise

## 2013-09-03 NOTE — Telephone Encounter (Signed)
Spoke with pharmacist of Rx change

## 2013-09-03 NOTE — Telephone Encounter (Signed)
Okay to change, may send new prescription under my name as needed

## 2013-09-09 ENCOUNTER — Other Ambulatory Visit: Payer: Self-pay | Admitting: Internal Medicine

## 2013-09-15 ENCOUNTER — Other Ambulatory Visit: Payer: Self-pay | Admitting: *Deleted

## 2013-09-15 ENCOUNTER — Encounter: Payer: Self-pay | Admitting: Internal Medicine

## 2013-09-15 ENCOUNTER — Ambulatory Visit (INDEPENDENT_AMBULATORY_CARE_PROVIDER_SITE_OTHER): Payer: 59 | Admitting: Internal Medicine

## 2013-09-15 VITALS — BP 120/80 | HR 63 | Temp 97.3°F | Wt 236.1 lb

## 2013-09-15 DIAGNOSIS — E119 Type 2 diabetes mellitus without complications: Secondary | ICD-10-CM

## 2013-09-15 DIAGNOSIS — B379 Candidiasis, unspecified: Secondary | ICD-10-CM

## 2013-09-15 DIAGNOSIS — M542 Cervicalgia: Secondary | ICD-10-CM | POA: Insufficient documentation

## 2013-09-15 DIAGNOSIS — Z23 Encounter for immunization: Secondary | ICD-10-CM

## 2013-09-15 MED ORDER — PEN NEEDLES 31G X 6 MM MISC: Status: DC

## 2013-09-15 MED ORDER — CARISOPRODOL 250 MG PO TABS: 125.0000 mg | ORAL_TABLET | Freq: Three times a day (TID) | ORAL | Status: DC

## 2013-09-15 MED ORDER — LIRAGLUTIDE 18 MG/3ML ~~LOC~~ SOPN: 0.6000 mg | PEN_INJECTOR | Freq: Every day | SUBCUTANEOUS | Status: DC

## 2013-09-15 MED ORDER — MICONAZOLE NITRATE 2 % VA CREA: TOPICAL_CREAM | VAGINAL | Status: DC
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
## 2013-09-15 NOTE — Patient Instructions (Addendum)
It was good to see you today. Your annual flu shot was given and/or updated today. Will use Monistat cream to fungus infection at affected skin - apply daily and as needed Begin low-dose Victoza injection to treat diabetes and help with weight loss efforts Your prescription(s) have been submitted to your pharmacy. Please take as directed and contact our office if you believe you are having problem(s) with the medication(s). We'll begin reduction of Soma dosing by taking "half tablet" 3 times daily until your next visit Keep followup appointment in November as scheduled for a diabetes recheck and review, call sooner if problems

## 2013-09-15 NOTE — Telephone Encounter (Signed)
Received fax pt is needing for pen needles...lmb

## 2013-09-15 NOTE — Assessment & Plan Note (Signed)
On metformin - will add Victoza low dose to help with weight loss efforts Check a1c q3-66mo and adjust tx as needed On ACEI, ASA 81 and statin (goal LDL >100) The patient is asked to make an attempt to improve diet and exercise patterns to aid in medical management of this problem.  Lab Results  Component Value Date   HGBA1C 6.8* 07/01/2013

## 2013-10-11 ENCOUNTER — Other Ambulatory Visit: Payer: Self-pay

## 2013-10-11 ENCOUNTER — Other Ambulatory Visit: Payer: Self-pay | Admitting: *Deleted

## 2013-10-11 ENCOUNTER — Other Ambulatory Visit: Payer: Self-pay | Admitting: Internal Medicine

## 2013-10-11 MED ORDER — METOPROLOL TARTRATE 100 MG PO TABS: 100.0000 mg | ORAL_TABLET | Freq: Two times a day (BID) | ORAL | Status: DC

## 2013-10-25 ENCOUNTER — Other Ambulatory Visit: Payer: Self-pay | Admitting: Internal Medicine

## 2013-10-25 NOTE — Telephone Encounter (Signed)
Called refill into  outpatient. Spoke with [facility] gave [place] approval...lmb

## 2013-10-25 NOTE — Telephone Encounter (Signed)
Ok to refill, phone in please 

## 2013-11-04 ENCOUNTER — Other Ambulatory Visit (INDEPENDENT_AMBULATORY_CARE_PROVIDER_SITE_OTHER): Payer: 59

## 2013-11-04 ENCOUNTER — Ambulatory Visit (INDEPENDENT_AMBULATORY_CARE_PROVIDER_SITE_OTHER): Payer: 59 | Admitting: Internal Medicine

## 2013-11-04 ENCOUNTER — Encounter: Payer: Self-pay | Admitting: Internal Medicine

## 2013-11-04 VITALS — BP 120/72 | HR 84 | Temp 97.1°F | Wt 230.4 lb

## 2013-11-04 DIAGNOSIS — R0683 Snoring: Secondary | ICD-10-CM

## 2013-11-04 DIAGNOSIS — R0602 Shortness of breath: Secondary | ICD-10-CM | POA: Insufficient documentation

## 2013-11-04 DIAGNOSIS — E119 Type 2 diabetes mellitus without complications: Secondary | ICD-10-CM

## 2013-11-04 DIAGNOSIS — R0609 Other forms of dyspnea: Secondary | ICD-10-CM

## 2013-11-04 DIAGNOSIS — E669 Obesity, unspecified: Secondary | ICD-10-CM

## 2013-11-04 DIAGNOSIS — E785 Hyperlipidemia, unspecified: Secondary | ICD-10-CM

## 2013-11-04 LAB — CBC WITH DIFFERENTIAL/PLATELET
Basophils Relative: 0.6 % (ref 0.0–3.0)
Eosinophils Relative: 2 % (ref 0.0–5.0)
HCT: 40.9 % (ref 39.0–52.0)
Hemoglobin: 13.7 g/dL (ref 13.0–17.0)
Lymphs Abs: 1.8 10*3/uL (ref 0.7–4.0)
MCV: 95.8 fl (ref 78.0–100.0)
Monocytes Absolute: 0.4 10*3/uL (ref 0.1–1.0)
Monocytes Relative: 5.6 % (ref 3.0–12.0)
Neutro Abs: 5.2 10*3/uL (ref 1.4–7.7)
Neutrophils Relative %: 68.7 % (ref 43.0–77.0)

## 2013-11-04 LAB — HEMOGLOBIN A1C: Hgb A1c MFr Bld: 6.2 % (ref 4.6–6.5)

## 2013-11-04 NOTE — Patient Instructions (Addendum)
It was good to see you today.  We have reviewed your prior records including labs and tests today  Test(s) ordered today. Your results will be released to MyChart (or called to you) after review, usually within 72hours after test completion. If any changes need to be made, you will be notified at that same time.  Medications reviewed and updated, no changes recommended today  we'll make referral to urology division for sleep evaluation because of her snoring. Also for cardiac stress test to evaluate for shortness of breath with exertion . Our office will contact you regarding appointment(s) once made.  Please schedule followup in 4 months for diabetes mellitus check, call sooner if problems.  Diabetes and Exercise Exercising regularly is important. It is not just about losing weight. It has many health benefits, such as:  Improving your overall fitness, flexibility, and endurance.  Increasing your bone density.  Helping with weight control.  Decreasing your body fat.  Increasing your muscle strength.  Reducing stress and tension.  Improving your overall health. People with diabetes who exercise gain additional benefits because exercise:  Reduces appetite.  Improves the body's use of blood sugar (glucose).  Helps lower or control blood glucose.  Decreases blood pressure.  Helps control blood lipids (such as cholesterol and triglycerides).  Improves the body's use of the hormone insulin by:  Increasing the body's insulin sensitivity.  Reducing the body's insulin needs.  Decreases the risk for heart disease because exercising:  Lowers cholesterol and triglycerides levels.  Increases the levels of good cholesterol (such as high-density lipoproteins [HDL]) in the body.  Lowers blood glucose levels. YOUR ACTIVITY PLAN  Choose an activity that you enjoy and set realistic goals. Your health care provider or diabetes educator can help you make an activity plan that works  for you. You can break activities into 2 or 3 sessions throughout the day. Doing so is as good as one long session. Exercise ideas include:  Taking the dog for a walk.  Taking the stairs instead of the elevator.  Dancing to your favorite song.  Doing your favorite exercise with a friend. RECOMMENDATIONS FOR EXERCISING WITH TYPE 1 OR TYPE 2 DIABETES   Check your blood glucose before exercising. If blood glucose levels are greater than 240 mg/dL, check for urine ketones. Do not exercise if ketones are present.  Avoid injecting insulin into areas of the body that are going to be exercised. For example, avoid injecting insulin into:  The arms when playing tennis.  The legs when jogging.  Keep a record of:  Food intake before and after you exercise.  Expected peak times of insulin action.  Blood glucose levels before and after you exercise.  The type and amount of exercise you have done.  Review your records with your health care provider. Your health care provider will help you to develop guidelines for adjusting food intake and insulin amounts before and after exercising.  If you take insulin or oral hypoglycemic agents, watch for signs and symptoms of hypoglycemia. They include:  Dizziness.  Shaking.  Sweating.  Chills.  Confusion.  Drink plenty of water while you exercise to prevent dehydration or heat stroke. Body water is lost during exercise and must be replaced.  Talk to your health care provider before starting an exercise program to make sure it is safe for you. Remember, almost any type of activity is better than none. Document Released: 02/29/2004 Document Revised: 08/11/2013 Document Reviewed: 05/18/2013 [facility] Patient Information 2014 [place], [place].

## 2013-11-04 NOTE — Assessment & Plan Note (Signed)
On metformin - rx'd Victoza 08/2013 (low dose) to help with weight loss efforts and DM control Check a1c q3-67mo and adjust tx as needed On ACEI, ASA 81 and statin (goal LDL <100) The patient is asked to make an attempt to improve diet and exercise patterns to aid in medical management of this problem.  Lab Results  Component Value Date   HGBA1C 6.8* 07/01/2013
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
## 2013-11-04 NOTE — Assessment & Plan Note (Signed)
On prava Check lipids annually, goal LDL<100 

## 2013-11-04 NOTE — Assessment & Plan Note (Signed)
Wt Readings from Last 3 Encounters:  11/04/13 230 lb 6.4 oz (104.509 kg)  09/15/13 236 lb 1.9 oz (107.103 kg)  07/01/13 233 lb (105.688 kg)   The patient is asked to make an attempt to improve diet and exercise patterns to aid in medical management of this problem.

## 2013-11-04 NOTE — Progress Notes (Signed)
Pre-visit discussion using our clinic review tool. No additional management support is needed unless otherwise documented below in the visit note.  

## 2013-11-04 NOTE — Assessment & Plan Note (Signed)
Chronic but increasing symptoms in past 3 months No clear chest pain but could be anginal equivalent Lexiscan 09/2010 without ischemia (report in EPIC reviewed) Check screening labs, refer for follow up cardiac stress test Also discussed potential of conditioning/overweight contributing to same

## 2013-11-24 ENCOUNTER — Ambulatory Visit (INDEPENDENT_AMBULATORY_CARE_PROVIDER_SITE_OTHER): Payer: 59 | Admitting: Internal Medicine

## 2013-11-24 ENCOUNTER — Encounter: Payer: Self-pay | Admitting: Internal Medicine

## 2013-11-24 ENCOUNTER — Ambulatory Visit (HOSPITAL_COMMUNITY): Payer: 59 | Attending: Internal Medicine

## 2013-11-24 VITALS — BP 110/82 | HR 77 | Temp 97.0°F | Wt 232.4 lb

## 2013-11-24 DIAGNOSIS — M7989 Other specified soft tissue disorders: Secondary | ICD-10-CM

## 2013-11-24 DIAGNOSIS — L03115 Cellulitis of right lower limb: Secondary | ICD-10-CM

## 2013-11-24 DIAGNOSIS — L02419 Cutaneous abscess of limb, unspecified: Secondary | ICD-10-CM

## 2013-11-24 MED ORDER — SULFAMETHOXAZOLE-TRIMETHOPRIM 800-160 MG PO TABS: 1.0000 | ORAL_TABLET | Freq: Two times a day (BID) | ORAL | Status: DC

## 2013-11-24 NOTE — Patient Instructions (Addendum)
It was good to see you today.  We have reviewed your prior records including labs and tests today  Doppler to rule out clot to be formed today at [address] location  Treatment will depend on results of this test If blood clot, we'll begin blood thinners If no blood clot, will treat with antibiotics for cellulitis  Cellulitis Cellulitis is an infection of the skin and the tissue under the skin. The infected area is usually red and tender. This happens most often in the arms and lower legs. HOME CARE   Take your antibiotic medicine as told. Finish the medicine even if you start to feel better.  Keep the infected arm or leg raised (elevated).  Put a warm cloth on the area up to 4 times per day.  Only take medicines as told by your doctor.  Keep all doctor visits as told. GET HELP RIGHT AWAY IF:   You have a fever.  You feel very sleepy.  You throw up (vomit) or have watery poop (diarrhea).  You feel sick and have muscle aches and pains.  You see red streaks on the skin coming from the infected area.  Your red area gets bigger or turns a dark color.  Your bone or joint under the infected area is painful after the skin heals.  Your infection comes back in the same area or different area.  You have a puffy (swollen) bump in the infected area.  You have new symptoms. MAKE SURE YOU:   Understand these instructions.  Will watch your condition.  Will get help right away if you are not doing well or get worse. Document Released: 05/27/2008 Document Revised: 06/09/2012 Document Reviewed: 02/24/2012 [facility] Patient Information 2014 [place], [place].

## 2013-11-24 NOTE — Progress Notes (Signed)
Pre-visit discussion using our clinic review tool. No additional management support is needed unless otherwise documented below in the visit note.  
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
## 2013-11-30 ENCOUNTER — Encounter: Payer: Self-pay | Admitting: Cardiology

## 2013-11-30 ENCOUNTER — Ambulatory Visit (HOSPITAL_COMMUNITY): Payer: 59 | Attending: Internal Medicine | Admitting: Radiology

## 2013-11-30 VITALS — BP 105/61 | HR 85 | Ht 69.0 in | Wt 231.0 lb

## 2013-11-30 DIAGNOSIS — E119 Type 2 diabetes mellitus without complications: Secondary | ICD-10-CM | POA: Insufficient documentation

## 2013-11-30 DIAGNOSIS — Z794 Long term (current) use of insulin: Secondary | ICD-10-CM | POA: Insufficient documentation

## 2013-11-30 DIAGNOSIS — R0609 Other forms of dyspnea: Secondary | ICD-10-CM | POA: Insufficient documentation

## 2013-11-30 DIAGNOSIS — R42 Dizziness and giddiness: Secondary | ICD-10-CM | POA: Insufficient documentation

## 2013-11-30 DIAGNOSIS — Z87891 Personal history of nicotine dependence: Secondary | ICD-10-CM | POA: Insufficient documentation

## 2013-11-30 DIAGNOSIS — I10 Essential (primary) hypertension: Secondary | ICD-10-CM | POA: Insufficient documentation

## 2013-11-30 DIAGNOSIS — R0989 Other specified symptoms and signs involving the circulatory and respiratory systems: Secondary | ICD-10-CM | POA: Insufficient documentation

## 2013-11-30 DIAGNOSIS — E785 Hyperlipidemia, unspecified: Secondary | ICD-10-CM | POA: Insufficient documentation

## 2013-11-30 DIAGNOSIS — E669 Obesity, unspecified: Secondary | ICD-10-CM

## 2013-11-30 DIAGNOSIS — R0602 Shortness of breath: Secondary | ICD-10-CM

## 2013-11-30 DIAGNOSIS — R002 Palpitations: Secondary | ICD-10-CM | POA: Insufficient documentation

## 2013-11-30 DIAGNOSIS — R079 Chest pain, unspecified: Secondary | ICD-10-CM

## 2013-11-30 DIAGNOSIS — R0789 Other chest pain: Secondary | ICD-10-CM | POA: Insufficient documentation

## 2013-11-30 MED ORDER — TECHNETIUM TC 99M SESTAMIBI GENERIC - CARDIOLITE
10.0000 | Freq: Once | INTRAVENOUS | Status: AC | PRN
  Administered 2013-11-30: 10 via INTRAVENOUS

## 2013-11-30 MED ORDER — TECHNETIUM TC 99M SESTAMIBI GENERIC - CARDIOLITE
30.0000 | Freq: Once | INTRAVENOUS | Status: AC | PRN
  Administered 2013-11-30: 30 via INTRAVENOUS

## 2013-11-30 MED ORDER — REGADENOSON 0.4 MG/5ML IV SOLN
0.4000 mg | Freq: Once | INTRAVENOUS | Status: AC
  Administered 2013-11-30: 0.4 mg via INTRAVENOUS

## 2013-11-30 NOTE — Progress Notes (Signed)
[facility] SITE 3 NUCLEAR MED [address] [place], [place] [address] [phone]    Cardiology Nuclear Med Study  Garrett [name] is a 62 y.o. male     MRN : [id number]     DOB: Aug 20, 1951  Procedure Date: 11/30/2013  Nuclear Med Background Indication for Stress Test:  Evaluation for Ischemia History:  No known CAD, Cath 1994, Stress Echo 2004 EF 60%, MPI (normal) EF 66% Cardiac Risk Factors: History of Smoking, Hypertension, IDDM, and Lipids  Symptoms:  Chest Pain (last date of chest discomfort was one month ago), Dizziness, DOE and Palpitations   Nuclear Pre-Procedure Caffeine/Decaff Intake:  None > 12 hrs NPO After: 7:00am   Lungs:  clear O2 Sat: 95% on room air. IV 0.9% NS with Angio Cath:  22g  IV Site: R Antecubital x 1, tolerated well IV Started by:  Irean Hong, RN  Chest Size (in):  44 Cup Size: n/a  Height: 5\' 9"  (1.753 m)  Weight:  231 lb (104.781 kg)  BMI:  Body mass index is 34.1 kg/(m^2). Tech Comments:  Fasting CBG was 149 at 6:00am with victoza taken. CBG was 103 at 12:30 on arrival. Irean Hong, RN    Nuclear Med Study 1 or 2 day study: 1 day  Stress Test Type:  Eugenie Birks  Reading MD: Willa Rough, MD  Order Authorizing Provider:  Rene Paci, MD  Resting Radionuclide: Technetium 64m Sestamibi  Resting Radionuclide Dose: 11.0 mCi   Stress Radionuclide:  Technetium 29m Sestamibi  Stress Radionuclide Dose: 33.0 mCi           Stress Protocol Rest HR: 85 Stress HR: 99  Rest BP: 105/61 Stress BP: 112/51  Exercise Time (min): n/a METS: n/a           Dose of Adenosine (mg):  n/a Dose of Lexiscan: 0.4 mg  Dose of Atropine (mg): n/a Dose of Dobutamine: n/a mcg/kg/min (at max HR)  Stress Test Technologist: Nelson Chimes, BS-ES  Nuclear Technologist:  Domenic Polite, CNMT     Rest Procedure:  Myocardial perfusion imaging was performed at rest 45 minutes following the intravenous administration of Technetium 24m Sestamibi. Rest ECG:  Normal EKG  Stress Procedure:  The patient received IV Lexiscan 0.4 mg over 15-seconds.  Technetium 44m Sestamibi injected at 30-seconds.  Quantitative spect images were obtained after a 45 minute delay.  During the infusion of Lexiscan, the patient complained of SOB, chest tightness, lightheadedness, and head pressure.  These symptoms began to resolve in recovery.  Stress ECG: No significant change from baseline ECG  QPS Raw Data Images:  Normal; no motion artifact; normal heart/lung ratio. Stress Images:  Normal homogeneous uptake in all areas of the myocardium. Rest Images:  Normal homogeneous uptake in all areas of the myocardium. Subtraction (SDS):  No evidence of ischemia. Transient Ischemic Dilatation (Normal <1.22):  1.10 Lung/Heart Ratio (Normal <0.45):  0.37  Quantitative Gated Spect Images QGS EDV:  106 ml QGS ESV:  35 ml  Impression Exercise Capacity:  Lexiscan with no exercise. BP Response:  Normal blood pressure response. Clinical Symptoms:  Shortness of breath ECG Impression:  No significant ST segment change suggestive of ischemia. Comparison with Prior Nuclear Study: Study is compared with a report from the study of October, 2011.  Overall Impression:  Normal stress nuclear study. There is no scar or ischemia. This is a low risk scan.  LV Ejection Fraction: 67%.  LV Wall Motion:  Normal Wall Motion.  Willa Rough, MD

## 2013-12-08 ENCOUNTER — Ambulatory Visit (INDEPENDENT_AMBULATORY_CARE_PROVIDER_SITE_OTHER): Payer: 59 | Admitting: Pulmonary Disease

## 2013-12-08 ENCOUNTER — Encounter: Payer: Self-pay | Admitting: Pulmonary Disease

## 2013-12-08 VITALS — BP 128/82 | HR 77 | Temp 98.0°F | Ht 69.0 in | Wt 235.8 lb

## 2013-12-08 DIAGNOSIS — G4733 Obstructive sleep apnea (adult) (pediatric): Secondary | ICD-10-CM

## 2013-12-08 NOTE — Patient Instructions (Signed)
Will schedule for home sleep testing, and will arrange followup with me once the results are available. Work on weight loss.

## 2013-12-08 NOTE — Progress Notes (Signed)
Subjective:    Patient ID: Garrett [name], male    DOB: 1951-04-09, 62 y.o.   MRN: [id number]  HPI The patient is a 62 year old male who I've been asked to see for possible obstructive sleep apnea. He has been noted to have loud snoring by his wife, as well as an abnormal breathing pattern during sleep. He awakens 1-2 times a night to go to the bathroom, and is rarely rested in the mornings upon arising. He notes occasional sleep pressure with paperwork or computer work during the day, and this occurs primarily in the afternoons. He will fall asleep easily watching television in the evenings. He denies any sleepiness with driving. The patient states that his weight is up about 10 pounds over the last 2 years, and his Epworth score today is 7.    Sleep Questionnaire What time do you typically go to bed?( Between what hours) 11-12 11-12 at 1515 on 12/08/13 by Maisie Fus, CMA How long does it take you to fall asleep? 30-40mins 30-37mins at 1515 on 12/08/13 by Maisie Fus, CMA How many times during the night do you wake up? 2 2 at 1515 on 12/08/13 by Maisie Fus, CMA What time do you get out of bed to start your day? 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 (In pounds) 10 lb (4.536 kg) 10 lb (4.536 kg) at 1515 on 12/08/13 by Maisie Fus, CMA Have you ever had a sleep study before? No No at 1515 on 12/08/13 by Maisie Fus, CMA Do you currently use CPAP? No No at 1515 on 12/08/13 by Maisie Fus, CMA Do you wear oxygen at any time? No No at 1515 on 12/08/13 by Maisie Fus, CMA   Review of Systems  Constitutional: Negative for fever and unexpected weight change.  HENT: Positive for congestion and sinus pressure. Negative for dental problem, ear  pain, nosebleeds, postnasal drip, rhinorrhea, sneezing, sore throat and trouble swallowing.   Eyes: Negative for redness and itching.  Respiratory: Positive for shortness of breath. Negative for cough, chest tightness and wheezing.   Cardiovascular: Negative for palpitations and leg swelling.  Gastrointestinal: Negative for nausea and vomiting.       Acid heartburn  Genitourinary: Negative for dysuria.  Musculoskeletal: Positive for joint swelling.  Skin: Negative for rash.  Neurological: Positive for headaches.  Hematological: Does not bruise/bleed easily.  Psychiatric/Behavioral: Negative for dysphoric mood. The patient is not nervous/anxious.        Objective:   Physical Exam Constitutional:  Obese male, no acute distress  HENT:  Nares patent without discharge, but deviated septum to left with near obstruction.   Oropharynx without exudate, palate and uvula are moderately elongated.   Eyes:  Perrla, eomi, no scleral icterus  Neck:  No JVD, no TMG  Cardiovascular:  Normal rate, regular rhythm, no rubs or gallops.  2/6 sem        Intact distal pulses  Pulmonary :  Normal breath sounds, no stridor or respiratory distress   No rales, rhonchi, or wheezing  Abdominal:  Soft, nondistended, bowel sounds present.  No tenderness noted.   Musculoskeletal:  mild lower extremity edema noted.  Lymph Nodes:  No cervical lymphadenopathy noted  Skin:  No cyanosis noted  Neurologic:  Alert, appropriate, moves all 4 extremities without obvious deficit.         Assessment & Plan:

## 2013-12-08 NOTE — Assessment & Plan Note (Signed)
The patient's history is very suggestive of clinically significant sleep apnea, and he has underlying comorbid medical issues which can be influenced by sleep disordered breathing. I have reviewed the pathophysiology of sleep apnea with him, including its impact to his quality of life and cardiovascular health. I think he needs to have a sleep study, and he would be a good candidate for home sleep testing. He is agreeable to this approach.

## 2013-12-09 ENCOUNTER — Telehealth: Payer: Self-pay | Admitting: Internal Medicine

## 2013-12-09 NOTE — Telephone Encounter (Signed)
Please call patient. Let him know results for his cardiac stress test were normal. No evidence for cardiac problem contributing to shortness of breath. Continue working with pulmonary. No treatment changes recommend

## 2013-12-10 NOTE — Telephone Encounter (Signed)
Notified pt with md response.../lmb 

## 2013-12-21 ENCOUNTER — Telehealth: Payer: Self-pay | Admitting: Internal Medicine

## 2013-12-21 ENCOUNTER — Telehealth: Payer: Self-pay | Admitting: *Deleted

## 2013-12-21 DIAGNOSIS — M545 Low back pain, unspecified: Secondary | ICD-10-CM

## 2013-12-21 DIAGNOSIS — I10 Essential (primary) hypertension: Secondary | ICD-10-CM

## 2013-12-21 DIAGNOSIS — E119 Type 2 diabetes mellitus without complications: Secondary | ICD-10-CM

## 2013-12-21 NOTE — Telephone Encounter (Signed)
Will do! thanks

## 2013-12-21 NOTE — Telephone Encounter (Signed)
Pt need labs bun and creatinine. Pt appt 12/29/13 @ 8am [facility] mri dept . I will inform pt about labs when I call to give him information about appt.  Thanks

## 2013-12-21 NOTE — Telephone Encounter (Signed)
pts wife called requesting MRI of Lumbar Spine due to severe back pain.  Please advise

## 2013-12-21 NOTE — Telephone Encounter (Signed)
Ordered -  Please note pt spouse is hoping to get MRI done before end of 2014 for insurance purposes - thanks

## 2013-12-22 ENCOUNTER — Other Ambulatory Visit: Payer: Self-pay | Admitting: Internal Medicine

## 2013-12-22 ENCOUNTER — Ambulatory Visit (HOSPITAL_COMMUNITY)
Admission: RE | Admit: 2013-12-22 | Discharge: 2013-12-22 | Disposition: A | Discharge status: Home / Self Care | Payer: 59 | Source: Ambulatory Visit | Attending: Internal Medicine | Admitting: Internal Medicine

## 2013-12-22 DIAGNOSIS — M5126 Other intervertebral disc displacement, lumbar region: Secondary | ICD-10-CM | POA: Insufficient documentation

## 2013-12-22 DIAGNOSIS — M5137 Other intervertebral disc degeneration, lumbosacral region: Secondary | ICD-10-CM | POA: Insufficient documentation

## 2013-12-22 DIAGNOSIS — M25559 Pain in unspecified hip: Secondary | ICD-10-CM | POA: Insufficient documentation

## 2013-12-22 DIAGNOSIS — M51379 Other intervertebral disc degeneration, lumbosacral region without mention of lumbar back pain or lower extremity pain: Secondary | ICD-10-CM | POA: Insufficient documentation

## 2013-12-22 DIAGNOSIS — M545 Low back pain, unspecified: Secondary | ICD-10-CM

## 2013-12-22 DIAGNOSIS — M48061 Spinal stenosis, lumbar region without neurogenic claudication: Secondary | ICD-10-CM | POA: Insufficient documentation

## 2013-12-24 ENCOUNTER — Encounter: Payer: Self-pay | Admitting: Internal Medicine

## 2013-12-29 ENCOUNTER — Telehealth: Payer: Self-pay

## 2013-12-29 ENCOUNTER — Ambulatory Visit (HOSPITAL_COMMUNITY): Payer: 59

## 2013-12-29 DIAGNOSIS — M545 Low back pain, unspecified: Secondary | ICD-10-CM

## 2013-12-29 DIAGNOSIS — R937 Abnormal findings on diagnostic imaging of other parts of musculoskeletal system: Secondary | ICD-10-CM

## 2013-12-29 NOTE — Telephone Encounter (Signed)
The patient's wife called and wants to get the patient husband referred to Dr.Stern [facility] off [address]) for his herniated disc in his back  ([phone] - Dr.Stern's office)   Pt's wife callback - [phone]

## 2013-12-30 ENCOUNTER — Other Ambulatory Visit: Payer: Self-pay | Admitting: Internal Medicine

## 2013-12-30 NOTE — Telephone Encounter (Signed)
Refer done as requested

## 2013-12-30 NOTE — Telephone Encounter (Signed)
Notified pt wife referral has been place.Will be contacted once appt has been set-up with appt info.Marland KitchenJohny Chess

## 2014-01-03 ENCOUNTER — Telehealth: Payer: Self-pay

## 2014-01-03 DIAGNOSIS — M549 Dorsalgia, unspecified: Secondary | ICD-10-CM

## 2014-01-03 DIAGNOSIS — R937 Abnormal findings on diagnostic imaging of other parts of musculoskeletal system: Secondary | ICD-10-CM

## 2014-01-03 NOTE — Telephone Encounter (Signed)
Ok - refer to [facility] done as requested

## 2014-01-03 NOTE — Telephone Encounter (Signed)
The patient's wife called and stated she spoke with the nuerologist and they are recommending the patient get a referral to Dr.Henry Trenton Gammon (fax [phone])   Wife's callback - [phone]

## 2014-01-03 NOTE — Telephone Encounter (Signed)
Called pt no answer LMOM md response. Once referral has been set-up will received call back from [facility] with appt, date and time...Garrett [name]

## 2014-01-06 DIAGNOSIS — M47817 Spondylosis without myelopathy or radiculopathy, lumbosacral region: Secondary | ICD-10-CM | POA: Insufficient documentation

## 2014-01-10 ENCOUNTER — Telehealth: Payer: Self-pay | Admitting: Pulmonary Disease

## 2014-01-10 NOTE — Telephone Encounter (Signed)
Spoke with patient's wife;Little yellow button on machine would keep coming on and not sure if test will be accurate. Will forward to [place] so when you download results you can relay to [facility].

## 2014-01-12 NOTE — Telephone Encounter (Signed)
Spoke with pt's wife and advised that Dr. Gwenette Greet read this study and has indicated that the finger probe was off part of the night. Dr. Gwenette Greet advised me to contact patient and give them the option of repeating the test one more time or going to the sleep lab. Pt/wife elected to repeat the home study again. Wife will pick up device on Thursday 01/13/14 and will program 10:00 pm to 6:00 am. Nothing else needed at this time. Rhonda J Cobb

## 2014-01-13 ENCOUNTER — Other Ambulatory Visit (HOSPITAL_COMMUNITY): Payer: Self-pay | Admitting: Neurosurgery

## 2014-01-13 DIAGNOSIS — M4712 Other spondylosis with myelopathy, cervical region: Secondary | ICD-10-CM

## 2014-01-14 DIAGNOSIS — G4733 Obstructive sleep apnea (adult) (pediatric): Secondary | ICD-10-CM

## 2014-01-17 ENCOUNTER — Other Ambulatory Visit: Payer: 59

## 2014-01-18 ENCOUNTER — Telehealth: Payer: Self-pay | Admitting: Pulmonary Disease

## 2014-01-18 ENCOUNTER — Encounter: Payer: Self-pay | Admitting: Pulmonary Disease

## 2014-01-18 DIAGNOSIS — D210 Benign neoplasm of connective and other soft tissue of head, face and neck: Secondary | ICD-10-CM

## 2014-01-18 NOTE — Telephone Encounter (Signed)
Pt needs ov to review sleep study results.  

## 2014-01-20 NOTE — Telephone Encounter (Signed)
LMOM x 1 

## 2014-01-21 ENCOUNTER — Ambulatory Visit (HOSPITAL_COMMUNITY)
Admission: RE | Admit: 2014-01-21 | Discharge: 2014-01-21 | Disposition: A | Discharge status: Home / Self Care | Payer: 59 | Source: Ambulatory Visit | Attending: Neurosurgery | Admitting: Neurosurgery

## 2014-01-21 DIAGNOSIS — R209 Unspecified disturbances of skin sensation: Secondary | ICD-10-CM | POA: Insufficient documentation

## 2014-01-21 DIAGNOSIS — M5020 Other cervical disc displacement, unspecified cervical region: Secondary | ICD-10-CM | POA: Insufficient documentation

## 2014-01-21 DIAGNOSIS — M542 Cervicalgia: Secondary | ICD-10-CM | POA: Insufficient documentation

## 2014-01-21 DIAGNOSIS — M4712 Other spondylosis with myelopathy, cervical region: Secondary | ICD-10-CM

## 2014-01-21 DIAGNOSIS — R29898 Other symptoms and signs involving the musculoskeletal system: Secondary | ICD-10-CM | POA: Insufficient documentation

## 2014-01-24 NOTE — Telephone Encounter (Signed)
LMOM x 2 

## 2014-01-25 NOTE — Telephone Encounter (Signed)
appt scheduled for 01/26/14 at 3:15 to review sleep study.

## 2014-01-26 ENCOUNTER — Ambulatory Visit (INDEPENDENT_AMBULATORY_CARE_PROVIDER_SITE_OTHER): Payer: 59 | Admitting: Pulmonary Disease

## 2014-01-26 ENCOUNTER — Encounter: Payer: Self-pay | Admitting: Pulmonary Disease

## 2014-01-26 VITALS — BP 130/78 | HR 76 | Temp 97.6°F | Ht 70.0 in | Wt 238.0 lb

## 2014-01-26 DIAGNOSIS — G4733 Obstructive sleep apnea (adult) (pediatric): Secondary | ICD-10-CM

## 2014-01-26 NOTE — Assessment & Plan Note (Signed)
The patient has mild obstructive sleep apnea on his recent sleep study, but is having significant disruption of his sleep with daytime alertness issues. I have outlined a conservative treatment with a trial of weight loss alone, as well as more aggressive treatment with surgery/dental appliance/CPAP. After a long discussion, the patient has decided to give CPAP a try while he is working on weight loss. I will set the patient up on cpap at a moderate pressure level to allow for desensitization, and will troubleshoot the device over the next 4-6weeks if needed.  The pt is to call me if having issues with tolerance.  Will then optimize the pressure once patient is able to wear cpap on a consistent basis.

## 2014-01-26 NOTE — Patient Instructions (Signed)
Will start on cpap on the auto setting.  Please call me if you are having tolerance issues. Work on weight loss.  followup with me in 8 weeks.

## 2014-01-26 NOTE — Progress Notes (Signed)
   Subjective:    Patient ID: Garrett [name], male    DOB: [birth year], 63 y.o.   MRN: [id number]  HPI The patient comes in today for followup after his recent home sleep test. He was found to have mild OSA, with an AHI of 6 events per hour. I have reviewed the study with him in detail, and answered all of his questions.   Review of Systems  Constitutional: Negative for fever and unexpected weight change.  HENT: Negative for congestion, dental problem, ear pain, nosebleeds, postnasal drip, rhinorrhea, sinus pressure, sneezing, sore throat and trouble swallowing.   Eyes: Negative for redness and itching.  Respiratory: Negative for cough, chest tightness, shortness of breath and wheezing.   Cardiovascular: Negative for palpitations and leg swelling.  Gastrointestinal: Negative for nausea and vomiting.  Genitourinary: Negative for dysuria.  Musculoskeletal: Negative for joint swelling.  Skin: Negative for rash.  Neurological: Negative for headaches.  Hematological: Does not bruise/bleed easily.  Psychiatric/Behavioral: Negative for dysphoric mood. The patient is not nervous/anxious.        Objective:   Physical Exam Obese male in no acute distress Nose without purulence or discharge noted Neck without lymphadenopathy or thyromegaly Lower extremities with mild edema, no cyanosis Alert and oriented, moves all 4 extremities.       Assessment & Plan:

## 2014-01-27 ENCOUNTER — Other Ambulatory Visit: Payer: Self-pay | Admitting: Neurosurgery

## 2014-01-31 ENCOUNTER — Other Ambulatory Visit: Payer: Self-pay | Admitting: Internal Medicine

## 2014-02-17 NOTE — Pre-Procedure Instructions (Signed)
Garrett [name]  02/17/2014   Your procedure is scheduled on: Friday, February 25, 2014 at 7:30 AM   Report to [facility] (use Main Entrance "A'') at 5:30 AM.  Call this number if you have problems the morning of surgery: [phone]   Remember:   Do not eat food or drink liquids after midnight Thursday.   Take these medicines the morning of surgery with A SIP OF WATER: allopurinol, namenda, metoprolol, nasonex, protonix.              DO NOT take any Diabetes medication the day of surgery.             Stop taking aspirin, herbal medications, anti-inflammatories 4-5 days prior to surgery.    Do not wear jewelry-no rings or watches.  Do not wear lotions or colognes. You may NOT wear deodorant.   Men may shave face and neck.   Do not bring valuables to the hospital.  [facility] is not responsible for any belongings or valuables.               Contacts, dentures or bridgework may not be worn into surgery.  Leave suitcase in the car. After surgery it may be brought to your room.  For patients admitted to the hospital, discharge time is determined by your treatment team.                Name and phone number of your driver:                                                                                                                                                                                                                     Special Instructions:   [facility] - Preparing for Surgery

## 2014-02-18 ENCOUNTER — Encounter (HOSPITAL_COMMUNITY)
Admission: RE | Admit: 2014-02-18 | Discharge: 2014-02-18 | Disposition: A | Discharge status: Home / Self Care | Payer: 59 | Source: Ambulatory Visit | Attending: Neurosurgery | Admitting: Neurosurgery

## 2014-02-18 ENCOUNTER — Encounter (HOSPITAL_COMMUNITY): Payer: Self-pay

## 2014-02-18 DIAGNOSIS — Z01812 Encounter for preprocedural laboratory examination: Secondary | ICD-10-CM | POA: Insufficient documentation

## 2014-02-18 HISTORY — DX: Cellulitis, unspecified: L03.90

## 2014-02-18 LAB — BASIC METABOLIC PANEL
BUN: 14 mg/dL (ref 6–23)
CALCIUM: 9.2 mg/dL (ref 8.4–10.5)
CO2: 22 mEq/L (ref 19–32)
Chloride: 108 mEq/L (ref 96–112)
Creatinine, Ser: 1.01 mg/dL (ref 0.50–1.35)
GFR, EST AFRICAN AMERICAN: 89 mL/min — AB (ref >90)
GFR, EST NON AFRICAN AMERICAN: 77 mL/min — AB (ref >90)
Glucose, Bld: 150 mg/dL — ABNORMAL HIGH (ref 70–99)
Potassium: 4 mEq/L (ref 3.7–5.3)
SODIUM: 143 meq/L (ref 137–147)

## 2014-02-18 LAB — CBC WITH DIFFERENTIAL/PLATELET
BASOS ABS: 0 10*3/uL (ref 0.0–0.1)
Basophils Relative: 0 % (ref 0–1)
Eosinophils Absolute: 0.2 10*3/uL (ref 0.0–0.7)
Eosinophils Relative: 2 % (ref 0–5)
HCT: 39.6 % (ref 39.0–52.0)
Hemoglobin: 13.4 g/dL (ref 13.0–17.0)
LYMPHS ABS: 1.9 10*3/uL (ref 0.7–4.0)
LYMPHS PCT: 25 % (ref 12–46)
MCH: 32.2 pg (ref 26.0–34.0)
MCHC: 33.8 g/dL (ref 30.0–36.0)
MCV: 95.2 fL (ref 78.0–100.0)
Monocytes Absolute: 0.5 10*3/uL (ref 0.1–1.0)
Monocytes Relative: 7 % (ref 3–12)
NEUTROS ABS: 5.1 10*3/uL (ref 1.7–7.7)
NEUTROS PCT: 66 % (ref 43–77)
PLATELETS: 209 10*3/uL (ref 150–400)
RBC: 4.16 MIL/uL — AB (ref 4.22–5.81)
RDW: 13.9 % (ref 11.5–15.5)
WBC: 7.6 10*3/uL (ref 4.0–10.5)

## 2014-02-18 LAB — SURGICAL PCR SCREEN
MRSA, PCR: NEGATIVE
STAPHYLOCOCCUS AUREUS: NEGATIVE

## 2014-02-22 ENCOUNTER — Other Ambulatory Visit: Payer: Self-pay | Admitting: Internal Medicine

## 2014-02-24 ENCOUNTER — Other Ambulatory Visit: Payer: Self-pay | Admitting: *Deleted

## 2014-02-24 MED ORDER — METFORMIN HCL 500 MG PO TABS: 500.0000 mg | ORAL_TABLET | Freq: Every day | ORAL | Status: DC

## 2014-02-24 MED ORDER — CEFAZOLIN SODIUM-DEXTROSE 2-3 GM-% IV SOLR
2.0000 g | INTRAVENOUS | Status: AC
  Administered 2014-02-25: 2 g via INTRAVENOUS
  Filled 2014-02-24: qty 50

## 2014-02-25 ENCOUNTER — Encounter (HOSPITAL_COMMUNITY): Payer: Self-pay | Admitting: Anesthesiology

## 2014-02-25 ENCOUNTER — Inpatient Hospital Stay (HOSPITAL_COMMUNITY)
Admission: RE | Admit: 2014-02-25 | Discharge: 2014-02-26 | DRG: 473 | Disposition: A | Discharge status: Home / Self Care | Payer: 59 | Source: Ambulatory Visit | Attending: Neurosurgery | Admitting: Neurosurgery

## 2014-02-25 ENCOUNTER — Inpatient Hospital Stay (HOSPITAL_COMMUNITY): Payer: 59

## 2014-02-25 ENCOUNTER — Encounter (HOSPITAL_COMMUNITY)
Admission: RE | Disposition: A | Discharge status: Home / Self Care | Payer: Self-pay | Source: Ambulatory Visit | Attending: Neurosurgery

## 2014-02-25 ENCOUNTER — Inpatient Hospital Stay (HOSPITAL_COMMUNITY): Payer: 59 | Admitting: Anesthesiology

## 2014-02-25 ENCOUNTER — Encounter (HOSPITAL_COMMUNITY): Payer: 59 | Admitting: Anesthesiology

## 2014-02-25 DIAGNOSIS — K219 Gastro-esophageal reflux disease without esophagitis: Secondary | ICD-10-CM | POA: Diagnosis present

## 2014-02-25 DIAGNOSIS — Z87891 Personal history of nicotine dependence: Secondary | ICD-10-CM

## 2014-02-25 DIAGNOSIS — M129 Arthropathy, unspecified: Secondary | ICD-10-CM | POA: Diagnosis present

## 2014-02-25 DIAGNOSIS — E1149 Type 2 diabetes mellitus with other diabetic neurological complication: Secondary | ICD-10-CM | POA: Diagnosis present

## 2014-02-25 DIAGNOSIS — I10 Essential (primary) hypertension: Secondary | ICD-10-CM | POA: Diagnosis present

## 2014-02-25 DIAGNOSIS — M7130 Other bursal cyst, unspecified site: Secondary | ICD-10-CM | POA: Diagnosis present

## 2014-02-25 DIAGNOSIS — M4302 Spondylolysis, cervical region: Secondary | ICD-10-CM

## 2014-02-25 DIAGNOSIS — M47812 Spondylosis without myelopathy or radiculopathy, cervical region: Principal | ICD-10-CM | POA: Diagnosis present

## 2014-02-25 DIAGNOSIS — M109 Gout, unspecified: Secondary | ICD-10-CM | POA: Diagnosis present

## 2014-02-25 DIAGNOSIS — E1142 Type 2 diabetes mellitus with diabetic polyneuropathy: Secondary | ICD-10-CM | POA: Diagnosis present

## 2014-02-25 HISTORY — PX: ANTERIOR CERVICAL DECOMP/DISCECTOMY FUSION: SHX1161

## 2014-02-25 HISTORY — PX: LUMBAR LAMINECTOMY/DECOMPRESSION MICRODISCECTOMY: SHX5026

## 2014-02-25 LAB — GLUCOSE, CAPILLARY
GLUCOSE-CAPILLARY: 102 mg/dL — AB (ref 70–99)
GLUCOSE-CAPILLARY: 248 mg/dL — AB (ref 70–99)
Glucose-Capillary: 144 mg/dL — ABNORMAL HIGH (ref 70–99)
Glucose-Capillary: 172 mg/dL — ABNORMAL HIGH (ref 70–99)
Glucose-Capillary: 241 mg/dL — ABNORMAL HIGH (ref 70–99)

## 2014-02-25 SURGERY — ANTERIOR CERVICAL DECOMPRESSION/DISCECTOMY FUSION 1 LEVEL
Anesthesia: General | Site: Back | Laterality: Right

## 2014-02-25 MED ORDER — METFORMIN HCL 500 MG PO TABS
500.0000 mg | ORAL_TABLET | Freq: Every day | ORAL | Status: DC
  Administered 2014-02-25 – 2014-02-26 (×2): 500 mg via ORAL
  Filled 2014-02-25 (×3): qty 1

## 2014-02-25 MED ORDER — VECURONIUM BROMIDE 10 MG IV SOLR
INTRAVENOUS | Status: DC | PRN
Start: 2014-02-25 — End: 2014-02-25
  Administered 2014-02-25: 2 mg via INTRAVENOUS
  Administered 2014-02-25: 1 mg via INTRAVENOUS

## 2014-02-25 MED ORDER — LIRAGLUTIDE 18 MG/3ML ~~LOC~~ SOPN: 0.6000 mg | PEN_INJECTOR | Freq: Every day | SUBCUTANEOUS | Status: DC

## 2014-02-25 MED ORDER — EPHEDRINE SULFATE 50 MG/ML IJ SOLN
INTRAMUSCULAR | Status: AC
  Filled 2014-02-25: qty 1

## 2014-02-25 MED ORDER — PANTOPRAZOLE SODIUM 40 MG PO TBEC
40.0000 mg | DELAYED_RELEASE_TABLET | Freq: Two times a day (BID) | ORAL | Status: DC
  Administered 2014-02-25 – 2014-02-26 (×2): 40 mg via ORAL
  Filled 2014-02-25 (×2): qty 1

## 2014-02-25 MED ORDER — HYDROMORPHONE HCL PF 1 MG/ML IJ SOLN: 0.5000 mg | INTRAMUSCULAR | Status: DC | PRN

## 2014-02-25 MED ORDER — LIDOCAINE HCL (CARDIAC) 20 MG/ML IV SOLN
INTRAVENOUS | Status: DC | PRN
  Administered 2014-02-25: 50 mg via INTRAVENOUS

## 2014-02-25 MED ORDER — MIDAZOLAM HCL 5 MG/5ML IJ SOLN
INTRAMUSCULAR | Status: DC | PRN
  Administered 2014-02-25 (×2): 1 mg via INTRAVENOUS

## 2014-02-25 MED ORDER — ACETAMINOPHEN 325 MG PO TABS: 650.0000 mg | ORAL_TABLET | ORAL | Status: DC | PRN

## 2014-02-25 MED ORDER — PROPOFOL 10 MG/ML IV BOLUS
INTRAVENOUS | Status: DC | PRN
  Administered 2014-02-25: 170 mg via INTRAVENOUS

## 2014-02-25 MED ORDER — FENTANYL CITRATE 0.05 MG/ML IJ SOLN
INTRAMUSCULAR | Status: AC
  Filled 2014-02-25: qty 5

## 2014-02-25 MED ORDER — HYDROMORPHONE HCL PF 1 MG/ML IJ SOLN
0.2500 mg | INTRAMUSCULAR | Status: DC | PRN
  Administered 2014-02-25: 0.25 mg via INTRAVENOUS
  Administered 2014-02-25 (×3): 0.5 mg via INTRAVENOUS
  Administered 2014-02-25: 0.25 mg via INTRAVENOUS

## 2014-02-25 MED ORDER — SENNA 8.6 MG PO TABS
1.0000 | ORAL_TABLET | Freq: Two times a day (BID) | ORAL | Status: DC
  Administered 2014-02-25: 8.6 mg via ORAL
  Filled 2014-02-25 (×3): qty 1

## 2014-02-25 MED ORDER — ROCURONIUM BROMIDE 100 MG/10ML IV SOLN
INTRAVENOUS | Status: DC | PRN
  Administered 2014-02-25: 10 mg via INTRAVENOUS
  Administered 2014-02-25: 40 mg via INTRAVENOUS

## 2014-02-25 MED ORDER — CEFAZOLIN SODIUM 1-5 GM-% IV SOLN
1.0000 g | Freq: Three times a day (TID) | INTRAVENOUS | Status: AC
  Administered 2014-02-25 (×2): 1 g via INTRAVENOUS
  Filled 2014-02-25 (×2): qty 50

## 2014-02-25 MED ORDER — NEOSTIGMINE METHYLSULFATE 1 MG/ML IJ SOLN
INTRAMUSCULAR | Status: AC
  Filled 2014-02-25: qty 10

## 2014-02-25 MED ORDER — CYANOCOBALAMIN 500 MCG PO TABS
500.0000 ug | ORAL_TABLET | Freq: Every day | ORAL | Status: DC
  Filled 2014-02-25: qty 1

## 2014-02-25 MED ORDER — ALUM & MAG HYDROXIDE-SIMETH 200-200-20 MG/5ML PO SUSP: 30.0000 mL | Freq: Four times a day (QID) | ORAL | Status: DC | PRN

## 2014-02-25 MED ORDER — VERAPAMIL HCL ER 240 MG PO TBCR
240.0000 mg | EXTENDED_RELEASE_TABLET | Freq: Every day | ORAL | Status: DC
  Administered 2014-02-25: 240 mg via ORAL
  Filled 2014-02-25 (×2): qty 1

## 2014-02-25 MED ORDER — CYCLOSPORINE 0.05 % OP EMUL
1.0000 [drp] | Freq: Every morning | OPHTHALMIC | Status: DC
  Filled 2014-02-25: qty 1

## 2014-02-25 MED ORDER — DEXAMETHASONE SODIUM PHOSPHATE 10 MG/ML IJ SOLN: 10.0000 mg | INTRAMUSCULAR | Status: DC

## 2014-02-25 MED ORDER — LACTATED RINGERS IV SOLN
INTRAVENOUS | Status: DC | PRN
  Administered 2014-02-25: 08:00:00 via INTRAVENOUS

## 2014-02-25 MED ORDER — SODIUM CHLORIDE 0.9 % IR SOLN
Status: DC | PRN
  Administered 2014-02-25: 09:00:00

## 2014-02-25 MED ORDER — ARTIFICIAL TEARS OP OINT
TOPICAL_OINTMENT | OPHTHALMIC | Status: AC
  Filled 2014-02-25: qty 3.5

## 2014-02-25 MED ORDER — HYDROCODONE-ACETAMINOPHEN 5-325 MG PO TABS: 1.0000 | ORAL_TABLET | Freq: Two times a day (BID) | ORAL | Status: DC

## 2014-02-25 MED ORDER — ONDANSETRON HCL 4 MG/2ML IJ SOLN
INTRAMUSCULAR | Status: AC
  Filled 2014-02-25: qty 2

## 2014-02-25 MED ORDER — TOPIRAMATE 25 MG PO TABS
25.0000 mg | ORAL_TABLET | Freq: Three times a day (TID) | ORAL | Status: DC
  Administered 2014-02-25: 25 mg via ORAL
  Filled 2014-02-25 (×4): qty 1

## 2014-02-25 MED ORDER — HYDROMORPHONE HCL PF 1 MG/ML IJ SOLN
INTRAMUSCULAR | Status: AC
  Filled 2014-02-25: qty 1

## 2014-02-25 MED ORDER — FUROSEMIDE 20 MG PO TABS
20.0000 mg | ORAL_TABLET | Freq: Every day | ORAL | Status: DC
  Filled 2014-02-25: qty 1

## 2014-02-25 MED ORDER — ONDANSETRON HCL 4 MG/2ML IJ SOLN: 4.0000 mg | INTRAMUSCULAR | Status: DC | PRN

## 2014-02-25 MED ORDER — TERAZOSIN HCL 5 MG PO CAPS
5.0000 mg | ORAL_CAPSULE | Freq: Every day | ORAL | Status: DC
  Administered 2014-02-25: 5 mg via ORAL
  Filled 2014-02-25 (×2): qty 1

## 2014-02-25 MED ORDER — SODIUM CHLORIDE 0.9 % IV SOLN: 250.0000 mL | INTRAVENOUS | Status: DC

## 2014-02-25 MED ORDER — SODIUM CHLORIDE 0.9 % IJ SOLN: 3.0000 mL | INTRAMUSCULAR | Status: DC | PRN

## 2014-02-25 MED ORDER — METFORMIN HCL 500 MG PO TABS
500.0000 mg | ORAL_TABLET | Freq: Every day | ORAL | Status: DC
  Filled 2014-02-25: qty 1

## 2014-02-25 MED ORDER — GLYCOPYRROLATE 0.2 MG/ML IJ SOLN
INTRAMUSCULAR | Status: AC
  Filled 2014-02-25: qty 2

## 2014-02-25 MED ORDER — ALLOPURINOL 300 MG PO TABS
300.0000 mg | ORAL_TABLET | Freq: Every day | ORAL | Status: DC
  Filled 2014-02-25: qty 1

## 2014-02-25 MED ORDER — ROCURONIUM BROMIDE 50 MG/5ML IV SOLN
INTRAVENOUS | Status: AC
  Filled 2014-02-25: qty 1

## 2014-02-25 MED ORDER — THROMBIN 20000 UNITS EX SOLR
CUTANEOUS | Status: DC | PRN
  Administered 2014-02-25: 09:00:00 via TOPICAL

## 2014-02-25 MED ORDER — ARTIFICIAL TEARS OP OINT
TOPICAL_OINTMENT | OPHTHALMIC | Status: DC | PRN
  Administered 2014-02-25: 1 via OPHTHALMIC

## 2014-02-25 MED ORDER — MIDAZOLAM HCL 2 MG/2ML IJ SOLN
INTRAMUSCULAR | Status: AC
  Filled 2014-02-25: qty 2

## 2014-02-25 MED ORDER — ONDANSETRON HCL 4 MG/2ML IJ SOLN
INTRAMUSCULAR | Status: DC | PRN
  Administered 2014-02-25: 4 mg via INTRAVENOUS

## 2014-02-25 MED ORDER — ASPIRIN 81 MG PO TABS: 81.0000 mg | ORAL_TABLET | Freq: Every day | ORAL | Status: DC

## 2014-02-25 MED ORDER — DEXAMETHASONE SODIUM PHOSPHATE 10 MG/ML IJ SOLN
INTRAMUSCULAR | Status: AC
  Administered 2014-02-25: 10 mg via INTRAVENOUS
  Filled 2014-02-25: qty 1

## 2014-02-25 MED ORDER — HYDROCODONE-ACETAMINOPHEN 5-325 MG PO TABS: 1.0000 | ORAL_TABLET | ORAL | Status: DC | PRN

## 2014-02-25 MED ORDER — MENTHOL 3 MG MT LOZG: 1.0000 | LOZENGE | OROMUCOSAL | Status: DC | PRN

## 2014-02-25 MED ORDER — FENTANYL CITRATE 0.05 MG/ML IJ SOLN
INTRAMUSCULAR | Status: DC | PRN
  Administered 2014-02-25 (×8): 50 ug via INTRAVENOUS

## 2014-02-25 MED ORDER — SODIUM CHLORIDE 0.9 % IJ SOLN
3.0000 mL | Freq: Two times a day (BID) | INTRAMUSCULAR | Status: DC
  Administered 2014-02-25: 3 mL via INTRAVENOUS

## 2014-02-25 MED ORDER — NEOSTIGMINE METHYLSULFATE 1 MG/ML IJ SOLN
INTRAMUSCULAR | Status: DC | PRN
  Administered 2014-02-25: 4 mg via INTRAVENOUS

## 2014-02-25 MED ORDER — METOPROLOL TARTRATE 100 MG PO TABS
100.0000 mg | ORAL_TABLET | Freq: Two times a day (BID) | ORAL | Status: DC
  Administered 2014-02-25: 100 mg via ORAL
  Filled 2014-02-25 (×3): qty 1

## 2014-02-25 MED ORDER — SUCCINYLCHOLINE CHLORIDE 20 MG/ML IJ SOLN
INTRAMUSCULAR | Status: AC
  Filled 2014-02-25: qty 2

## 2014-02-25 MED ORDER — PROPOFOL 10 MG/ML IV BOLUS
INTRAVENOUS | Status: AC
  Filled 2014-02-25: qty 20

## 2014-02-25 MED ORDER — ACETAMINOPHEN 650 MG RE SUPP: 650.0000 mg | RECTAL | Status: DC | PRN

## 2014-02-25 MED ORDER — OXYCODONE-ACETAMINOPHEN 5-325 MG PO TABS
1.0000 | ORAL_TABLET | ORAL | Status: DC | PRN
  Administered 2014-02-25 – 2014-02-26 (×4): 2 via ORAL
  Filled 2014-02-25 (×4): qty 2

## 2014-02-25 MED ORDER — SIMVASTATIN 20 MG PO TABS
20.0000 mg | ORAL_TABLET | Freq: Every day | ORAL | Status: DC
  Filled 2014-02-25: qty 1

## 2014-02-25 MED ORDER — ATORVASTATIN CALCIUM 10 MG PO TABS
10.0000 mg | ORAL_TABLET | Freq: Every day | ORAL | Status: DC
  Administered 2014-02-25: 10 mg via ORAL
  Filled 2014-02-25 (×2): qty 1

## 2014-02-25 MED ORDER — MEMANTINE HCL ER 28 MG PO CP24
1.0000 | ORAL_CAPSULE | Freq: Every day | ORAL | Status: DC
  Filled 2014-02-25: qty 28

## 2014-02-25 MED ORDER — RAMIPRIL 10 MG PO CAPS
10.0000 mg | ORAL_CAPSULE | Freq: Every day | ORAL | Status: DC
  Filled 2014-02-25: qty 1

## 2014-02-25 MED ORDER — CELECOXIB 400 MG PO CAPS
400.0000 mg | ORAL_CAPSULE | Freq: Two times a day (BID) | ORAL | Status: DC
  Administered 2014-02-26: 400 mg via ORAL
  Filled 2014-02-25 (×3): qty 1

## 2014-02-25 MED ORDER — GLYCOPYRROLATE 0.2 MG/ML IJ SOLN
INTRAMUSCULAR | Status: DC | PRN
  Administered 2014-02-25: 0.6 mg via INTRAVENOUS

## 2014-02-25 MED ORDER — BUPIVACAINE HCL (PF) 0.25 % IJ SOLN
INTRAMUSCULAR | Status: DC | PRN
  Administered 2014-02-25: 20 mL

## 2014-02-25 MED ORDER — PHENYLEPHRINE 40 MCG/ML (10ML) SYRINGE FOR IV PUSH (FOR BLOOD PRESSURE SUPPORT)
PREFILLED_SYRINGE | INTRAVENOUS | Status: AC
Start: 2014-02-25 — End: 2014-02-25
  Filled 2014-02-25: qty 10

## 2014-02-25 MED ORDER — CELECOXIB 400 MG PO CAPS: 400.0000 mg | ORAL_CAPSULE | Freq: Two times a day (BID) | ORAL | Status: DC

## 2014-02-25 MED ORDER — 0.9 % SODIUM CHLORIDE (POUR BTL) OPTIME
TOPICAL | Status: DC | PRN
  Administered 2014-02-25: 1000 mL

## 2014-02-25 MED ORDER — LACTATED RINGERS IV SOLN
INTRAVENOUS | Status: DC | PRN
  Administered 2014-02-25 (×2): via INTRAVENOUS

## 2014-02-25 MED ORDER — PHENOL 1.4 % MT LIQD
1.0000 | OROMUCOSAL | Status: DC | PRN
  Administered 2014-02-25: 1 via OROMUCOSAL
  Filled 2014-02-25: qty 177

## 2014-02-25 MED ORDER — EPHEDRINE SULFATE 50 MG/ML IJ SOLN
INTRAMUSCULAR | Status: DC | PRN
  Administered 2014-02-25: 5 mg via INTRAVENOUS
  Administered 2014-02-25: 10 mg via INTRAVENOUS
  Administered 2014-02-25 (×2): 5 mg via INTRAVENOUS
  Administered 2014-02-25: 10 mg via INTRAVENOUS

## 2014-02-25 MED ORDER — ASPIRIN EC 81 MG PO TBEC
81.0000 mg | DELAYED_RELEASE_TABLET | Freq: Every day | ORAL | Status: DC
  Filled 2014-02-25: qty 1

## 2014-02-25 MED ORDER — CYCLOBENZAPRINE HCL 10 MG PO TABS
10.0000 mg | ORAL_TABLET | Freq: Three times a day (TID) | ORAL | Status: DC | PRN
  Administered 2014-02-25 – 2014-02-26 (×2): 10 mg via ORAL
  Filled 2014-02-25 (×2): qty 1

## 2014-02-25 SURGICAL SUPPLY — 68 items
BAG DECANTER FOR FLEXI CONT (MISCELLANEOUS) ×6 IMPLANT
BENZOIN TINCTURE PRP APPL 2/3 (GAUZE/BANDAGES/DRESSINGS) ×3 IMPLANT
BLADE SURG ROTATE 9660 (MISCELLANEOUS) IMPLANT
BRUSH SCRUB EZ PLAIN DRY (MISCELLANEOUS) ×6 IMPLANT
BUR CUTTER 7.0 ROUND (BURR) ×3 IMPLANT
BUR MATCHSTICK NEURO 3.0 LAGG (BURR) ×3 IMPLANT
CANISTER SUCT 3000ML (MISCELLANEOUS) ×6 IMPLANT
CONT SPEC 4OZ CLIKSEAL STRL BL (MISCELLANEOUS) ×6 IMPLANT
DECANTER SPIKE VIAL GLASS SM (MISCELLANEOUS) ×3 IMPLANT
DERMABOND ADHESIVE PROPEN (GAUZE/BANDAGES/DRESSINGS) ×1
DERMABOND ADVANCED (GAUZE/BANDAGES/DRESSINGS)
DERMABOND ADVANCED .7 DNX12 (GAUZE/BANDAGES/DRESSINGS) IMPLANT
DERMABOND ADVANCED .7 DNX6 (GAUZE/BANDAGES/DRESSINGS) ×2 IMPLANT
DRAPE C-ARM 42X72 X-RAY (DRAPES) ×6 IMPLANT
DRAPE LAPAROTOMY 100X72 PEDS (DRAPES) ×3 IMPLANT
DRAPE LAPAROTOMY 100X72X124 (DRAPES) ×3 IMPLANT
DRAPE MICROSCOPE ZEISS OPMI (DRAPES) ×6 IMPLANT
DRAPE POUCH INSTRU U-SHP 10X18 (DRAPES) ×6 IMPLANT
DRAPE PROXIMA HALF (DRAPES) IMPLANT
DRAPE SURG 17X23 STRL (DRAPES) ×6 IMPLANT
DURAPREP 26ML APPLICATOR (WOUND CARE) ×3 IMPLANT
DURAPREP 6ML APPLICATOR 50/CS (WOUND CARE) ×3 IMPLANT
DURASEAL APPLICATOR TIP (TIP) ×3 IMPLANT
DURASEAL SPINE SEALANT 3ML (MISCELLANEOUS) ×3 IMPLANT
ELECT COATED BLADE 2.86 ST (ELECTRODE) ×3 IMPLANT
ELECT REM PT RETURN 9FT ADLT (ELECTROSURGICAL) ×6
ELECTRODE REM PT RTRN 9FT ADLT (ELECTROSURGICAL) ×4 IMPLANT
GAUZE SPONGE 4X4 16PLY XRAY LF (GAUZE/BANDAGES/DRESSINGS) IMPLANT
GLOVE ECLIPSE 6.5 STRL STRAW (GLOVE) ×3 IMPLANT
GLOVE ECLIPSE 8.5 STRL (GLOVE) ×6 IMPLANT
GLOVE EXAM NITRILE LRG STRL (GLOVE) IMPLANT
GLOVE EXAM NITRILE MD LF STRL (GLOVE) IMPLANT
GLOVE EXAM NITRILE XL STR (GLOVE) IMPLANT
GLOVE EXAM NITRILE XS STR PU (GLOVE) IMPLANT
GLOVE INDICATOR 7.0 STRL GRN (GLOVE) ×24 IMPLANT
GOWN BRE IMP SLV AUR LG STRL (GOWN DISPOSABLE) IMPLANT
GOWN BRE IMP SLV AUR XL STRL (GOWN DISPOSABLE) ×3 IMPLANT
GOWN STRL REIN 2XL LVL4 (GOWN DISPOSABLE) IMPLANT
HEAD HALTER (SOFTGOODS) ×3 IMPLANT
KIT BASIN OR (CUSTOM PROCEDURE TRAY) ×6 IMPLANT
KIT ROOM TURNOVER OR (KITS) ×6 IMPLANT
NEEDLE HYPO 22GX1.5 SAFETY (NEEDLE) ×3 IMPLANT
NEEDLE SPNL 20GX3.5 QUINCKE YW (NEEDLE) ×3 IMPLANT
NEEDLE SPNL 22GX3.5 QUINCKE BK (NEEDLE) ×3 IMPLANT
NS IRRIG 1000ML POUR BTL (IV SOLUTION) ×6 IMPLANT
PACK LAMINECTOMY NEURO (CUSTOM PROCEDURE TRAY) ×6 IMPLANT
PAD ARMBOARD 7.5X6 YLW CONV (MISCELLANEOUS) ×18 IMPLANT
PEEK CAGE 7X14X11 (Cage) ×3 IMPLANT
PLATE ELITE VISION 25MM (Plate) ×3 IMPLANT
PUTTY DBX 1CC (Putty) ×3 IMPLANT
PUTTY DBX 1CC DEPUY (Putty) ×2 IMPLANT
RUBBERBAND STERILE (MISCELLANEOUS) ×12 IMPLANT
SCREW ST 13X4XST VA NS SPNE (Screw) ×2 IMPLANT
SCREW ST VAR 4 ATL (Screw) ×1 IMPLANT
SPONGE GAUZE 4X4 12PLY (GAUZE/BANDAGES/DRESSINGS) ×3 IMPLANT
SPONGE INTESTINAL PEANUT (DISPOSABLE) ×3 IMPLANT
SPONGE SURGIFOAM ABS GEL SZ50 (HEMOSTASIS) ×6 IMPLANT
STRIP CLOSURE SKIN 1/2X4 (GAUZE/BANDAGES/DRESSINGS) ×6 IMPLANT
SUT PDS AB 5-0 P3 18 (SUTURE) ×3 IMPLANT
SUT PROLENE 6 0 BV (SUTURE) ×6 IMPLANT
SUT VIC AB 2-0 CT1 18 (SUTURE) ×3 IMPLANT
SUT VIC AB 3-0 SH 8-18 (SUTURE) ×6 IMPLANT
SYR 20ML ECCENTRIC (SYRINGE) ×6 IMPLANT
TAPE CLOTH 4X10 WHT NS (GAUZE/BANDAGES/DRESSINGS) ×3 IMPLANT
TOWEL OR 17X24 6PK STRL BLUE (TOWEL DISPOSABLE) ×6 IMPLANT
TOWEL OR 17X26 10 PK STRL BLUE (TOWEL DISPOSABLE) ×6 IMPLANT
TRAP SPECIMEN MUCOUS 40CC (MISCELLANEOUS) ×3 IMPLANT
WATER STERILE IRR 1000ML POUR (IV SOLUTION) ×6 IMPLANT

## 2014-02-25 NOTE — Brief Op Note (Signed)
02/25/2014  11:25 AM  PATIENT:  Garrett [name]  63 y.o. male  PRE-OPERATIVE DIAGNOSIS:  synovial cyst/spondylosis/stenosis  POST-OPERATIVE DIAGNOSIS:  synovial cyst/ spondlosis/stenosis  PROCEDURE:  Procedure(s): ANTERIOR CERVICAL DECOMPRESSION/DISCECTOMY FUSION 1 LEVEL five/six (N/A) LUMBAR LAMINECTOMY/DECOMPRESSION MICRODISCECTOMY 1 LEVEL four/five (Right)  SURGEON:  Surgeon(s) and Role:    * [name], MD - Primary    * Winfield Cunas, MD - Assisting  PHYSICIAN ASSISTANT:   ASSISTANTS:    ANESTHESIA:   general  EBL:  Total I/O In: 1600 [I.V.:1600] Out: 400 [Blood:400]  BLOOD ADMINISTERED:none  DRAINS: none   LOCAL MEDICATIONS USED:  MARCAINE     SPECIMEN:  No Specimen  DISPOSITION OF SPECIMEN:  N/A  COUNTS:  YES  TOURNIQUET:  * No tourniquets in log *  DICTATION: .Dragon Dictation  PLAN OF CARE: Admit to inpatient   PATIENT DISPOSITION:  PACU - hemodynamically stable.   Delay start of Pharmacological VTE agent (>24hrs) due to surgical blood loss or risk of bleeding: yes

## 2014-02-25 NOTE — Anesthesia Postprocedure Evaluation (Signed)
  Anesthesia Post-op Note  Patient: Garrett [name]  Procedure(s) Performed: Procedure(s): ANTERIOR CERVICAL DECOMPRESSION/DISCECTOMY FUSION 1 LEVEL five/six (N/A) LUMBAR LAMINECTOMY/DECOMPRESSION MICRODISCECTOMY 1 LEVEL four/five (Right)  Patient Location: PACU  Anesthesia Type:General  Level of Consciousness: awake  Airway and Oxygen Therapy: Patient Spontanous Breathing  Post-op Pain: mild  Post-op Assessment: Post-op Vital signs reviewed  Post-op Vital Signs: Reviewed  Complications: No apparent anesthesia complications

## 2014-02-25 NOTE — Progress Notes (Signed)
Utilization review completed.  

## 2014-02-25 NOTE — Transfer of Care (Signed)
Immediate Anesthesia Transfer of Care Note  Patient: Garrett [name]  Procedure(s) Performed: Procedure(s): ANTERIOR CERVICAL DECOMPRESSION/DISCECTOMY FUSION 1 LEVEL five/six (N/A) LUMBAR LAMINECTOMY/DECOMPRESSION MICRODISCECTOMY 1 LEVEL four/five (Right)  Patient Location: PACU  Anesthesia Type:General  Level of Consciousness: awake, alert  and oriented  Airway & Oxygen Therapy: Patient Spontanous Breathing and Patient connected to face mask oxygen  Post-op Assessment: Report given to PACU RN  Post vital signs: Reviewed and stable  Complications: No apparent anesthesia complications

## 2014-02-25 NOTE — Anesthesia Preprocedure Evaluation (Addendum)
Anesthesia Evaluation  Patient identified by MRN, date of birth, ID band Patient awake    Reviewed: Allergy & Precautions, H&P , NPO status , Patient's Chart, lab work & pertinent test results, reviewed documented beta blocker date and time   Airway Mallampati: II TM Distance: >3 FB Neck ROM: Full    Dental  (+) Edentulous Upper   Pulmonary shortness of breath, asthma , sleep apnea , former smoker,  breath sounds clear to auscultation  Pulmonary exam normal       Cardiovascular hypertension, + Peripheral Vascular Disease and + DOE Rhythm:Regular Rate:Normal     Neuro/Psych  Headaches,    GI/Hepatic GERD-  Medicated,  Endo/Other  diabetes, Well Controlled, Type 2  Renal/GU      Musculoskeletal   Abdominal Normal abdominal exam  (+)   Peds  Hematology   Anesthesia Other Findings   Reproductive/Obstetrics                          Anesthesia Physical Anesthesia Plan  ASA: III  Anesthesia Plan: General   Post-op Pain Management:    Induction: Intravenous  Airway Management Planned: Oral ETT  Additional Equipment:   Intra-op Plan:   Post-operative Plan: Extubation in OR  Informed Consent: I have reviewed the patients History and Physical, chart, labs and discussed the procedure including the risks, benefits and alternatives for the proposed anesthesia with the patient or authorized representative who has indicated his/her understanding and acceptance.   Dental advisory given  Plan Discussed with: CRNA, Anesthesiologist and Surgeon  Anesthesia Plan Comments:         Anesthesia Quick Evaluation

## 2014-02-25 NOTE — Progress Notes (Signed)
Orthopedic Tech Progress Note Patient Details:  Garrett [name] January 21, 1951 [phone] Spoke with patient's nurse; patient has soft collar. No action needed at this time from [facility]. Patient ID: Garrett [name], male   DOB: February 08, 1951, 63 y.o.   MRN: [id number]   Garrett [name] 02/25/2014, 2:49 PM

## 2014-02-25 NOTE — Preoperative (Signed)
Beta Blockers   Reason not to administer Beta Blockers:Not Applicable 

## 2014-02-25 NOTE — Op Note (Signed)
Date of procedure: 02/25/2014 Date of dictation: Same  Service: Neurosurgery  Preoperative diagnosis: Right C5-6 spondylosis and stenosis with radiculopathy.  Right L4-5 spondylosis and adherent synovial cyst with stenosis and radiculopathy.  Postoperative diagnosis: Same  Procedure Name: C5-6 anterior cervical discectomy and interbody fusion utilizing peak interbody cage, local autograft, morselized allograft, and anterior plate instrumentation.  Right L4-5 decompressive laminotomy with right L4 and L5 decompressive foraminotomies and resection of densely adherent synovial cyst. Microdissection.    Surgeon:Mikenzi Raysor A.Kassidie Hendriks, M.D.  Asst. Surgeon: Christella Noa   Anesthesia: General  Indication: The patient is a 63 year old male with history of a chronic neck pain Tenormin radiculopathy failing conservative management her workup demonstrates evidence of significant disc degeneration and right word C5-6 disc herniation and stenosis. Patient presents now for C5-6 anterior cervical decompression and fusion. Patient also with severe back pain and radiculopathy secondary to L4-5 spondylosis and stenosis and associated synovial cyst. Patient presents now for right-sided L4-5 laminotomy and resection of synovial cyst.  Operative note: After induction anesthesia, patient positioned prone on the Wilson frame and appropriate padded. Lumbar region prepped and draped. Incision made overlying L4-5. Dissection performed on the right side. Retractor placed. X-ray taken. Level confirmed. Decompressive laminotomy performed using high-speed drill and Kerrison rongeurs to remove the inferior aspect of lamina of L4 medial aspect the L4-5 facet joint and superior rim of the L5 lamina. Ligament flavum was elevated and resected so fashion using Kerrison rongeurs. Underlying thecal sac was identified. Small interfering durotomy was made paracentrally on the right. No evidence of nerve root injury. Dural laceration was isolated  and oversewn using 6-0 Prolene suture. This was accomplished a watertight fashion. Microscope brought field these might dissection the spinal canal. Synovial cyst Ascent be densely. The lateral dura and L5 nerve root. This is dissected free using dental instruments and resected using Kerrison rongeurs. All elements the synovial cysts were completely resected. Wide decompressive foraminotomies were performed on course exiting right-sided L4 and L5 nerve roots. This point a very 30 depression achieved. Wound is then irrigated out like solution. DuraSeal was placed over the dural repair and laminotomy defect. Gelfoam was placed over the laminotomy defect as well. Wounds and close in layers with Vicryl sutures. Steri-Strips and sterile dressing were applied.  The patient was repositioned supine on the operative bed. Anterior cervical region was prepped and draped sterilely with the neck in slight extension and held in place of halter traction. Incision made overlying C5-6. Dissection proceeds down to the platysma. This is then divided vertically and dissection disease on the medial border of the cervical and amounts muscle and carotid sheath. Trachea and esophagus are mobilized and retracted towards the left. Prevertebral fascia stripped off the anterior spinal column. Longus colli muscles elevated bilaterally using electric. Deep self-retaining are displaced interoperative fluoroscopy used levels were confirmed. The space at C5-6 is an incised 15 blade her finger fashion. Y. disc space clear was achieved using pituitary rongeurs for tobacco progress Kerrison rongeurs the high-speed drill. Almost the disc removed down to level posterior annulus. Microscope and brought field these at the remainder of the discectomy remaining aspects of annulus and osteophytes removed using high-speed drill down to the level of the posterior laterally. Posterior lateral and was elevated and resected thecal fashion using Kerrison  rongeurs. Underlying thecal sac was identified. A wide central decompression and perform undercutting the bodies of C5 and C6. Decompression MCH are of foramen. Wide anterior foraminotomies were then performed on course exiting C6 nerve roots bilaterally.  Wound is then irrigated out like solution. Gelfoam was placed topically for hemostasis which was found to be good 7 mm peek cage was then packed with morselized autograft obtained during the discectomy and a small amount of demineralized bone matrix putty. The cage was then packed into place and slightly excessive recessed from the anterior cortical margin of C5-6. 25 mg in his anterior cervical plate was then placed over the C5 and C6 levels. This an attachment or fluoroscopic guidance and 13 mm variable screws 2 each in all levels. All 4 screws given final tightening. Locking screws engaged both levels. Final images revealed good position the rest and hardware proper level with normal lamina spine. Wound is then irrigated out like solution. Hemostasis was assured with bipolar cautery was and closed in typical fashion. Steri-Strips triggers were applied. There were no apparent competitions. Patient tolerated the procedure well and he returns recovery room postop.
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
## 2014-02-26 LAB — GLUCOSE, CAPILLARY: Glucose-Capillary: 156 mg/dL — ABNORMAL HIGH (ref 70–99)

## 2014-02-26 MED ORDER — OXYCODONE-ACETAMINOPHEN 5-325 MG PO TABS: 1.0000 | ORAL_TABLET | ORAL | Status: DC | PRN

## 2014-02-26 MED ORDER — CYCLOBENZAPRINE HCL 10 MG PO TABS: 10.0000 mg | ORAL_TABLET | Freq: Three times a day (TID) | ORAL | Status: DC | PRN

## 2014-02-26 NOTE — Discharge Summary (Signed)
Physician Discharge Summary  Patient ID: Garrett [name] MRN: [id number] DOB/AGE: 01/25/1951 63 y.o.  Admit date: 02/25/2014 Discharge date: 02/26/2014  Admission Diagnoses: Right C6 radiculopathy from spondylosis and stenosis at C5-6-1 right L5 radiculopathy from a synovial cyst L4-5  Discharge Diagnoses: Same Active Problems:   Spondylolysis of cervical region   Discharged Condition: good  Hospital Course: Patient admitted hospital underwent a ACDF at C5-6 and a right-sided L4-5 laminectomy with resection of synovial cyst and postop patient did very well recovered in the floor on the floor he was angling and voiding spontaneously tolerating regular diet was stable for discharge  Consults: Significant Diagnostic Studies: Treatments: Right L4-5 laminectomy for resection of synovial cyst an ACDF at C5-6 Discharge Exam: Blood pressure 127/79, pulse 76, temperature 97.9 F (36.6 C), temperature source Oral, resp. rate 20, SpO2 92.00%. Strength out of 5 wound clean and dry  Disposition: Home   Future Appointments Provider Department Dept Phone   03/23/2014 9:15 AM Kathee Delton, MD [facility] [phone]   03/28/2014 8:00 AM Rowe Clack, MD [facility] [phone]       Medication List         allopurinol 300 MG tablet  Commonly known as:  ZYLOPRIM  Take 300 mg by mouth daily.     aspirin 81 MG tablet  Take 81 mg by mouth daily.     celecoxib 200 MG capsule  Commonly known as:  CELEBREX  Take 400 mg by mouth 2 (two) times daily.     CINNAMON PO  Take 1 capsule by mouth daily.     cyanocobalamin 500 MCG tablet  Take 500 mcg by mouth daily.     cyclobenzaprine 10 MG tablet  Commonly known as:  FLEXERIL  Take 1 tablet (10 mg total) by mouth 3 (three) times daily as needed for muscle spasms.     cycloSPORINE 0.05 % ophthalmic emulsion  Commonly known as:  RESTASIS  Place 1 drop into both eyes every morning.     EPIPEN 0.3  mg/0.3 mL Soaj injection  Generic drug:  EPINEPHrine  Inject 0.3 mg into the muscle once.     furosemide 20 MG tablet  Commonly known as:  LASIX  Take 20 mg by mouth daily.     HYDROcodone-acetaminophen 5-325 MG per tablet  Commonly known as:  NORCO/VICODIN  Take 1 tablet by mouth 2 (two) times daily.     Liraglutide 18 MG/3ML Sopn  Commonly known as:  VICTOZA  Inject 0.6 mg into the skin daily.     Memantine HCl ER 28 MG Cp24  Commonly known as:  NAMENDA XR  Take 28 mg by mouth daily.     metFORMIN 500 MG tablet  Commonly known as:  GLUCOPHAGE  Take 1 tablet (500 mg total) by mouth daily.     metoprolol 100 MG tablet  Commonly known as:  LOPRESSOR  Take 1 tablet (100 mg total) by mouth 2 (two) times daily.     ONE-A-DAY WEIGHT SMART ADVANCE PO  Take by mouth daily.     oxyCODONE-acetaminophen 5-325 MG per tablet  Commonly known as:  PERCOCET/ROXICET  Take 1-2 tablets by mouth every 4 (four) hours as needed for moderate pain.     pantoprazole 40 MG tablet  Commonly known as:  PROTONIX  Take 40 mg by mouth 2 (two) times daily.     Pen Needles 31G X 6 MM Misc  Use to inject victoza into skin once  daily     Potassium Gluconate 550 MG Tabs  Take by mouth daily.     pravastatin 20 MG tablet  Commonly known as:  PRAVACHOL  Take 1 tablet (20 mg total) by mouth daily.     ramipril 10 MG capsule  Commonly known as:  ALTACE  Take 10 mg by mouth daily.     terazosin 5 MG capsule  Commonly known as:  HYTRIN  Take 5 mg by mouth at bedtime.     topiramate 25 MG tablet  Commonly known as:  TOPAMAX  Take 25 mg by mouth 3 (three) times daily.     verapamil 240 MG CR tablet  Commonly known as:  CALAN-SR  Take 240 mg by mouth at bedtime.           Follow-up Information   Follow up with [name], MD.   Specialty:  Neurosurgery   Contact information:   [address] [place] [address] [phone]       Signed: Graham Hyun P 02/26/2014,  8:17 AM

## 2014-02-26 NOTE — Discharge Instructions (Addendum)
Wound Care Keep incision covered and dry for two days.  If you shower, cover incision with plastic wrap.  Do not put any creams, lotions, or ointments on incision. Leave steri-strips on back/neck.  They will fall off by themselves. Activity Walk each and every day, increasing distance each day. No lifting greater than 5 lbs.  Avoid excessive neck motion. No bending, No lifting, No twisting No driving; may ride as a passenger locally. Wear neck brace at all times except when showering. Diet Resume your normal diet.  Return to Work Will be discussed at you follow up appointment. Call Your Doctor If Any of These Occur Redness, drainage, or swelling at the wound.  Temperature greater than 101 degrees. Severe pain not relieved by pain medication. Incision starts to come apart. Follow Up Appt Call today for appointment in 1-2 weeks ([phone]) or for problems.  If you have any hardware placed in your spine, you will need an x-ray before your appointment.

## 2014-02-26 NOTE — Progress Notes (Addendum)
Pt. Alert and oriented, follows simple instructions, denies pain. Incision area without swelling, redness or S/S of infection. Voiding adequate clear yellow urine. Moving all extremities well and vitals stable and documented. Patient discharged home with spouse. Anterior Cervical Fusion and Lumbar  surgery notes instructions given to patient and family member for home safety and precautions. Pt. and family stated understanding of instructions given. Pain medication given prior to discharged.

## 2014-02-26 NOTE — Progress Notes (Signed)
Subjective: Patient reports Is doing very well no arm pain no leg pain soreness is well-managed ambulating and voiding  Objective: Vital signs in last 24 hours: Temp:  [97.3 F (36.3 C)-98.7 F (37.1 C)] 97.9 F (36.6 C) (03/07 0744) Pulse Rate:  [59-114] 76 (03/07 0744) Resp:  [10-20] 20 (03/07 0744) BP: (105-145)/(53-85) 127/79 mmHg (03/07 0744) SpO2:  [91 %-98 %] 92 % (03/07 0744)  Intake/Output from previous day: 03/06 0701 - 03/07 0700 In: 2560 [P.O.:960; I.V.:1600] Out: 3000 [Urine:2600; Blood:400] Intake/Output this shift:    Strength out of 5 wound clean and dry  Lab Results: No results found for this basename: WBC, HGB, HCT, PLT,  in the last 72 hours BMET No results found for this basename: NA, K, CL, CO2, GLUCOSE, BUN, CREATININE, CALCIUM,  in the last 72 hours  Studies/Results: Dg Cervical Spine 2-3 Views  02/25/2014   CLINICAL DATA:  ACDF at C5-6  EXAM: CERVICAL SPINE - 2-3 VIEW; DG C-ARM 1-60 MIN  COMPARISON:  MR C SPINE W/O CM dated 01/21/2014  FLUOROSCOPY TIME:  5 seconds  FINDINGS: Two intraoperative fluoroscopic spot images of the cervical spine provided. There is anterior cervical fusion at C5-6 which is incompletely visualized secondary to obscuration from overlying soft tissues. There is an endotracheal tube and nasogastric tube present.  IMPRESSION: ACDF at C5-6.   Electronically Signed   By: Kathreen Devoid   On: 02/25/2014 13:31   Dg Lumbar Spine 1 View  02/25/2014   CLINICAL DATA:  RIGHT L4-5 LAMINECTOMY  EXAM: LUMBAR SPINE - 1 VIEW  COMPARISON:  None.  FINDINGS: Single fluoroscopic cross-table lateral view of the lumbar spine. There are tissue retractors posteriorly. There is a metallic probe with the tip projecting over the posterior elements of L5.  There is degenerative disc disease of the lumbar spine.  IMPRESSION: Intraoperative cross-table lateral view of the lumbar spine with a metallic probe projecting over the L5 vertebral body.   Electronically Signed    By: Kathreen Devoid   On: 02/25/2014 13:03   Dg C-arm 1-60 Min  02/25/2014   CLINICAL DATA:  ACDF at C5-6  EXAM: CERVICAL SPINE - 2-3 VIEW; DG C-ARM 1-60 MIN  COMPARISON:  MR C SPINE W/O CM dated 01/21/2014  FLUOROSCOPY TIME:  5 seconds  FINDINGS: Two intraoperative fluoroscopic spot images of the cervical spine provided. There is anterior cervical fusion at C5-6 which is incompletely visualized secondary to obscuration from overlying soft tissues. There is an endotracheal tube and nasogastric tube present.  IMPRESSION: ACDF at C5-6.   Electronically Signed   By: Kathreen Devoid   On: 02/25/2014 13:31    Assessment/Plan: Progressive mobilization this morning we'll plan discharge home  LOS: 1 day     Garrett [name] P 02/26/2014, 8:14 AM

## 2014-02-28 ENCOUNTER — Encounter (HOSPITAL_COMMUNITY): Payer: Self-pay | Admitting: Neurosurgery

## 2014-03-08 ENCOUNTER — Ambulatory Visit: Payer: 59 | Admitting: Internal Medicine

## 2014-03-15 ENCOUNTER — Ambulatory Visit: Payer: 59 | Admitting: Internal Medicine

## 2014-03-22 ENCOUNTER — Other Ambulatory Visit (HOSPITAL_COMMUNITY): Payer: 59

## 2014-03-23 ENCOUNTER — Other Ambulatory Visit: Payer: Self-pay | Admitting: Internal Medicine

## 2014-03-23 ENCOUNTER — Ambulatory Visit (INDEPENDENT_AMBULATORY_CARE_PROVIDER_SITE_OTHER): Payer: 59 | Admitting: Pulmonary Disease

## 2014-03-23 ENCOUNTER — Encounter: Payer: Self-pay | Admitting: Pulmonary Disease

## 2014-03-23 VITALS — BP 120/70 | HR 75 | Temp 97.7°F | Ht 70.0 in | Wt 233.8 lb

## 2014-03-23 DIAGNOSIS — G4733 Obstructive sleep apnea (adult) (pediatric): Secondary | ICD-10-CM

## 2014-03-23 NOTE — Patient Instructions (Signed)
Will increase your upper pressure limit to 20, and let the machine make adjustments. Work on weight loss followup with me again in 98mos.

## 2014-03-23 NOTE — Assessment & Plan Note (Signed)
The patient is doing very well on CPAP, and feels that he is tolerating the machine quite well. He feels that he needs a little more pressure at times, and the download does show that he is getting to the upper limits during the night. We will therefore increase the upper pressure limit on the automatic setting. I have asked him to keep up with mask cushion changes and supplies, and to followup with me in 6 months.

## 2014-03-23 NOTE — Progress Notes (Signed)
   Subjective:    Patient ID: Garrett [name], male    DOB: 19-Aug-1951, 63 y.o.   MRN: [id number]  HPI The patient comes in today for followup of his obstructive sleep apnea. He has been started on CPAP with the automatic setting, and has done well since the last visit. He denies any issues with his mask fit, but does feel like he needs more pressure at times. His download does show that he is reaching the higher pressure limit at times during the night. He is unsure if he is sleeping better or has improved daytime alertness with the device, because he also has had back and neck surgery that has led to increased discomfort in sleep disruption.   Review of Systems  Constitutional: Negative for fever and unexpected weight change.  HENT: Negative for congestion, dental problem, ear pain, nosebleeds, postnasal drip, rhinorrhea, sinus pressure, sneezing, sore throat and trouble swallowing.   Eyes: Negative for redness and itching.  Respiratory: Negative for cough, chest tightness, shortness of breath and wheezing.   Cardiovascular: Negative for palpitations and leg swelling.  Gastrointestinal: Negative for nausea and vomiting.  Genitourinary: Negative for dysuria.  Musculoskeletal: Negative for joint swelling.  Skin: Negative for rash.  Neurological: Negative for headaches.  Hematological: Does not bruise/bleed easily.  Psychiatric/Behavioral: Negative for dysphoric mood. The patient is not nervous/anxious.        Objective:   Physical Exam Overweight male in no acute distress Nose without purulence or discharge noted No skin breakdown or pressure necrosis from the CPAP mask Neck without lymphadenopathy or thyromegaly Lower extremities without edema, no cyanosis Alert and oriented, moves all 4 extremities.       Assessment & Plan:

## 2014-03-28 ENCOUNTER — Encounter: Payer: Self-pay | Admitting: Internal Medicine

## 2014-03-28 ENCOUNTER — Other Ambulatory Visit (INDEPENDENT_AMBULATORY_CARE_PROVIDER_SITE_OTHER): Payer: 59

## 2014-03-28 ENCOUNTER — Telehealth: Payer: Self-pay | Admitting: Pulmonary Disease

## 2014-03-28 ENCOUNTER — Ambulatory Visit (INDEPENDENT_AMBULATORY_CARE_PROVIDER_SITE_OTHER): Payer: 59 | Admitting: Internal Medicine

## 2014-03-28 ENCOUNTER — Encounter: Payer: 59 | Admitting: Internal Medicine

## 2014-03-28 VITALS — BP 118/78 | HR 78 | Temp 97.2°F | Wt 232.0 lb

## 2014-03-28 DIAGNOSIS — M109 Gout, unspecified: Secondary | ICD-10-CM

## 2014-03-28 DIAGNOSIS — Z23 Encounter for immunization: Secondary | ICD-10-CM

## 2014-03-28 DIAGNOSIS — E119 Type 2 diabetes mellitus without complications: Secondary | ICD-10-CM

## 2014-03-28 DIAGNOSIS — Z0000 Encounter for general adult medical examination without abnormal findings: Secondary | ICD-10-CM

## 2014-03-28 DIAGNOSIS — L570 Actinic keratosis: Secondary | ICD-10-CM

## 2014-03-28 DIAGNOSIS — G4733 Obstructive sleep apnea (adult) (pediatric): Secondary | ICD-10-CM

## 2014-03-28 DIAGNOSIS — Z2911 Encounter for prophylactic immunotherapy for respiratory syncytial virus (RSV): Secondary | ICD-10-CM

## 2014-03-28 DIAGNOSIS — I10 Essential (primary) hypertension: Secondary | ICD-10-CM

## 2014-03-28 LAB — CBC WITH DIFFERENTIAL/PLATELET
BASOS PCT: 0.5 % (ref 0.0–3.0)
Basophils Absolute: 0 10*3/uL (ref 0.0–0.1)
EOS PCT: 4.1 % (ref 0.0–5.0)
Eosinophils Absolute: 0.3 10*3/uL (ref 0.0–0.7)
HEMATOCRIT: 41.1 % (ref 39.0–52.0)
HEMOGLOBIN: 13.8 g/dL (ref 13.0–17.0)
LYMPHS ABS: 1.7 10*3/uL (ref 0.7–4.0)
Lymphocytes Relative: 20.8 % (ref 12.0–46.0)
MCHC: 33.5 g/dL (ref 30.0–36.0)
MCV: 95.8 fl (ref 78.0–100.0)
MONO ABS: 0.6 10*3/uL (ref 0.1–1.0)
Monocytes Relative: 7 % (ref 3.0–12.0)
NEUTROS ABS: 5.6 10*3/uL (ref 1.4–7.7)
NEUTROS PCT: 67.6 % (ref 43.0–77.0)
Platelets: 242 10*3/uL (ref 150.0–400.0)
RBC: 4.29 Mil/uL (ref 4.22–5.81)
RDW: 14.5 % (ref 11.5–14.6)
WBC: 8.3 10*3/uL (ref 4.5–10.5)

## 2014-03-28 LAB — TSH: TSH: 1.15 u[IU]/mL (ref 0.35–5.50)

## 2014-03-28 LAB — BASIC METABOLIC PANEL
BUN: 13 mg/dL (ref 6–23)
CO2: 24 mEq/L (ref 19–32)
Calcium: 9.4 mg/dL (ref 8.4–10.5)
Chloride: 108 mEq/L (ref 96–112)
Creatinine, Ser: 1.1 mg/dL (ref 0.4–1.5)
GFR: 72.4 mL/min (ref >60.00)
Glucose, Bld: 131 mg/dL — ABNORMAL HIGH (ref 70–99)
POTASSIUM: 3.7 meq/L (ref 3.5–5.1)
SODIUM: 141 meq/L (ref 135–145)

## 2014-03-28 LAB — HEPATIC FUNCTION PANEL
ALBUMIN: 3.8 g/dL (ref 3.5–5.2)
ALT: 39 U/L (ref 0–53)
AST: 23 U/L (ref 0–37)
Alkaline Phosphatase: 82 U/L (ref 39–117)
Bilirubin, Direct: 0.1 mg/dL (ref 0.0–0.3)
TOTAL PROTEIN: 7.1 g/dL (ref 6.0–8.3)
Total Bilirubin: 0.6 mg/dL (ref 0.3–1.2)

## 2014-03-28 LAB — MICROALBUMIN / CREATININE URINE RATIO
CREATININE, U: 31.4 mg/dL
MICROALB/CREAT RATIO: 0.6 mg/g (ref 0.0–30.0)
Microalb, Ur: 0.2 mg/dL (ref 0.0–1.9)

## 2014-03-28 LAB — LIPID PANEL
CHOL/HDL RATIO: 4
Cholesterol: 158 mg/dL (ref 0–200)
HDL: 43.1 mg/dL (ref >39.00)
LDL Cholesterol: 88 mg/dL (ref 0–99)
TRIGLYCERIDES: 137 mg/dL (ref 0.0–149.0)
VLDL: 27.4 mg/dL (ref 0.0–40.0)

## 2014-03-28 LAB — HEMOGLOBIN A1C: Hgb A1c MFr Bld: 6.1 % (ref 4.6–6.5)

## 2014-03-28 MED ORDER — ALLOPURINOL 100 MG PO TABS: 100.0000 mg | ORAL_TABLET | Freq: Every day | ORAL | Status: DC

## 2014-03-28 NOTE — Assessment & Plan Note (Signed)
BP Readings from Last 3 Encounters:  03/28/14 118/78  03/23/14 120/70  02/26/14 127/79   Multi med tx reviewed - but effective The current medical regimen is effective;  continue present plan and medications.

## 2014-03-28 NOTE — Assessment & Plan Note (Signed)
On metformin - rx'd Victoza 08/2013 (low dose) to help with weight loss efforts and DM control Check a1c q3-42mo and adjust tx as needed On ACEI, ASA 81 and statin (goal LDL <100) The patient is asked to make an attempt to improve diet and exercise patterns to aid in medical management of this problem.  Lab Results  Component Value Date   HGBA1C 6.2 11/04/2013
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
## 2014-03-28 NOTE — Assessment & Plan Note (Signed)
Began CPAP 11/2013 - feels sleep improved Following with [facility] for same Interval hx reviewed

## 2014-03-28 NOTE — Telephone Encounter (Signed)
Called spoke with patient who verified that his CPAP pressure was increased at last ov on 4.1.15 to 5-20cm.  Pt feels that the pressure is now "way too powerful" and it is "blowing raspberries all night."  Pt stated that his mask does need a new seal and he is going to [facility] today to have this done.  [place] please advise, thank you.

## 2014-03-28 NOTE — Assessment & Plan Note (Signed)
No recent flares Remotely on 100mg  - would like to resume same erx for new dose done

## 2014-03-28 NOTE — Telephone Encounter (Signed)
Called spoke with patient who reported his CPAP pressure was recently increased (4.2.15) from 10-11 to 17-18cm.

## 2014-03-28 NOTE — Progress Notes (Signed)
Pre visit review using our clinic review tool, if applicable. No additional management support is needed unless otherwise documented below in the visit note. 

## 2014-03-28 NOTE — Telephone Encounter (Signed)
Let him know the auto will only increase to that pressure if he needs it for that degree of sleep apnea at that particular time.  It means that he needs that pressure.  Would see how he does once gets new seals for his mask.  These need to be replaced every 2-3 mos.  If he still has issues with air leaks, would consider changing auto pressure to 5-15.

## 2014-03-28 NOTE — Patient Instructions (Addendum)
It was good to see you today.  We have reviewed your prior records including labs and tests today  Health Maintenance reviewed - Shingles vaccine updated today -all other recommended immunizations and age-appropriate screenings are up-to-date.  Test(s) ordered today. Your results will be released to [place] (or called to you) after review, usually within 72hours after test completion. If any changes need to be made, you will be notified at that same time.  Medications reviewed and updated, no changes recommended at this time.  Please schedule followup in 6 months for diabetes exam and labs, call sooner if problems.  Health Maintenance, Males A healthy lifestyle and preventative care can promote health and wellness.  Maintain regular health, dental, and eye exams.  Eat a healthy diet. Foods like vegetables, fruits, whole grains, low-fat dairy products, and lean protein foods contain the nutrients you need and are low in calories. Decrease your intake of foods high in solid fats, added sugars, and salt. Get information about a proper diet from your health care provider, if necessary.  Regular physical exercise is one of the most important things you can do for your health. Most adults should get at least 150 minutes of moderate-intensity exercise (any activity that increases your heart rate and causes you to sweat) each week. In addition, most adults need muscle-strengthening exercises on 2 or more days a week.   Maintain a healthy weight. The body mass index (BMI) is a screening tool to identify possible weight problems. It provides an estimate of body fat based on height and weight. Your health care provider can find your BMI and can help you achieve or maintain a healthy weight. For males 20 years and older:  A BMI below 18.5 is considered underweight.  A BMI of 18.5 to 24.9 is normal.  A BMI of 25 to 29.9 is considered overweight.  A BMI of 30 and above is considered  obese.  Maintain normal blood lipids and cholesterol by exercising and minimizing your intake of saturated fat. Eat a balanced diet with plenty of fruits and vegetables. Blood tests for lipids and cholesterol should begin at age 37 and be repeated every 5 years. If your lipid or cholesterol levels are high, you are over 50, or you are at high risk for heart disease, you may need your cholesterol levels checked more frequently.Ongoing high lipid and cholesterol levels should be treated with medicines, if diet and exercise are not working.  If you smoke, find out from your health care provider how to quit. If you do not use tobacco, do not start.  Lung cancer screening is recommended for adults aged 68 80 years who are at high risk for developing lung cancer because of a history of smoking. A yearly low-dose CT scan of the lungs is recommended for people who have at least a 30-pack-year history of smoking and are a current smoker or have quit within the past 15 years. A pack year of smoking is smoking an average of 1 pack of cigarettes a day for 1 year (for example, a 30-pack-year history of smoking could mean smoking 1 pack a day for 30 years or 2 packs a day for 15 years). Yearly screening should continue until the smoker has stopped smoking for at least 15 years. Yearly screening should be stopped for people who develop a health problem that would prevent them from having lung cancer treatment.  If you choose to drink alcohol, do not have more than 2 drinks per day. One  drink is considered to be 12 oz (360 mL) of beer, 5 oz (150 mL) of wine, or 1.5 oz (45 mL) of liquor.  Avoid use of street drugs. Do not share needles with anyone. Ask for help if you need support or instructions about stopping the use of drugs.  High blood pressure causes heart disease and increases the risk of stroke. Blood pressure should be checked at least every 1 2 years. Ongoing high blood pressure should be treated with  medicines if weight loss and exercise are not effective.  If you are 33 64 years old, ask your health care provider if you should take aspirin to prevent heart disease.  Diabetes screening involves taking a blood sample to check your fasting blood sugar level. This should be done once every 3 years after age 50, if you are at a normal weight and without risk factors for diabetes. Testing should be considered at a younger age or be carried out more frequently if you are overweight and have at least 1 risk factor for diabetes.  Colorectal cancer can be detected and often prevented. Most routine colorectal cancer screening begins at the age of 70 and continues through age 79. However, your health care provider may recommend screening at an earlier age if you have risk factors for colon cancer. On a yearly basis, your health care provider may provide home test kits to check for hidden blood in the stool. A small camera at the end of a tube may be used to directly examine the colon (sigmoidoscopy or colonoscopy) to detect the earliest forms of colorectal cancer. Talk to your health care provider about this at age 43, when routine screening begins. A direct exam of the colon should be repeated every 5 10 years through age 62, unless early forms of pre-cancerous polyps or small growths are found.  People who are at an increased risk for hepatitis B should be screened for this virus. You are considered at high risk for hepatitis B if:  You were born in a country where hepatitis B occurs often. Talk with your health care provider about which countries are considered high-risk.  Your parents were born in a high-risk country and you have not received a shot to protect against hepatitis B (hepatitis B vaccine).  You have HIV or AIDS.  You use needles to inject street drugs.  You live with, or have sex with, someone who has hepatitis B.  You are a man who has sex with other men (MSM).  You get hemodialysis  treatment.  You take certain medicines for conditions like cancer, organ transplantation, and autoimmune conditions.  Hepatitis C blood testing is recommended for all people born from [birth year] through 1966 and any individual with known risk factors for hepatitis C.  Healthy men should no longer receive prostate-specific antigen (PSA) blood tests as part of routine cancer screening. Talk to your health care provider about prostate cancer screening.  Testicular cancer screening is not recommended for adolescents or adult males who have no symptoms. Screening includes self-exam, a health care provider exam, and other screening tests. Consult with your health care provider about any symptoms you have or any concerns you have about testicular cancer.  Practice safe sex. Use condoms and avoid high-risk sexual practices to reduce the spread of sexually transmitted infections (STIs).  Use sunscreen. Apply sunscreen liberally and repeatedly throughout the day. You should seek shade when your shadow is shorter than you. Protect yourself by wearing long sleeves, pants,  a wide-brimmed hat, and sunglasses year round, whenever you are outdoors.  Tell your health care provider of new moles or changes in moles, especially if there is a change in shape or color. Also tell your provider if a mole is larger than the size of a pencil eraser.  A one-time screening for abdominal aortic aneurysm (AAA) and surgical repair of large AAAs by ultrasound is recommended for men aged 9 75 years who are current or former smokers.  Stay current with your vaccines (immunizations). Document Released: 06/06/2008 Document Revised: 09/29/2013 Document Reviewed: 05/06/2011 [facility] Patient Information 2014 [place], [place].

## 2014-03-28 NOTE — Telephone Encounter (Signed)
Spoke with patient. Aware of the recs below--pt states that is will await his new mask and will let us know if any problems.  Nothing further needed.

## 2014-03-30 NOTE — Addendum Note (Signed)
Addended by: Gwendolyn Grant A on: 03/30/2014 12:21 PM   Modules accepted: Orders

## 2014-04-12 ENCOUNTER — Ambulatory Visit: Payer: 59 | Attending: Neurosurgery | Admitting: Physical Therapy

## 2014-04-12 DIAGNOSIS — M2560 Stiffness of unspecified joint, not elsewhere classified: Secondary | ICD-10-CM | POA: Insufficient documentation

## 2014-04-12 DIAGNOSIS — M545 Low back pain, unspecified: Secondary | ICD-10-CM | POA: Insufficient documentation

## 2014-04-12 DIAGNOSIS — IMO0001 Reserved for inherently not codable concepts without codable children: Secondary | ICD-10-CM | POA: Insufficient documentation

## 2014-04-13 LAB — HM DIABETES EYE EXAM

## 2014-04-19 ENCOUNTER — Encounter: Payer: 59 | Admitting: Physical Therapy

## 2014-04-21 ENCOUNTER — Ambulatory Visit: Payer: 59 | Admitting: Physical Therapy

## 2014-04-26 ENCOUNTER — Ambulatory Visit: Payer: 59 | Attending: Neurosurgery | Admitting: Physical Therapy

## 2014-04-26 DIAGNOSIS — Z5189 Encounter for other specified aftercare: Secondary | ICD-10-CM | POA: Insufficient documentation

## 2014-04-26 DIAGNOSIS — M2560 Stiffness of unspecified joint, not elsewhere classified: Secondary | ICD-10-CM | POA: Insufficient documentation

## 2014-04-26 DIAGNOSIS — M545 Low back pain, unspecified: Secondary | ICD-10-CM | POA: Insufficient documentation

## 2014-04-28 ENCOUNTER — Ambulatory Visit: Payer: 59 | Admitting: Physical Therapy

## 2014-04-28 ENCOUNTER — Other Ambulatory Visit (HOSPITAL_COMMUNITY): Payer: Self-pay | Admitting: Neurosurgery

## 2014-04-28 DIAGNOSIS — M47817 Spondylosis without myelopathy or radiculopathy, lumbosacral region: Secondary | ICD-10-CM

## 2014-05-03 ENCOUNTER — Encounter: Payer: Self-pay | Admitting: Internal Medicine

## 2014-05-05 ENCOUNTER — Ambulatory Visit (HOSPITAL_COMMUNITY)
Admission: RE | Admit: 2014-05-05 | Discharge: 2014-05-05 | Disposition: A | Discharge status: Home / Self Care | Payer: 59 | Source: Ambulatory Visit | Attending: Neurosurgery | Admitting: Neurosurgery

## 2014-05-05 ENCOUNTER — Ambulatory Visit: Payer: 59 | Admitting: Physical Therapy

## 2014-05-05 DIAGNOSIS — R209 Unspecified disturbances of skin sensation: Secondary | ICD-10-CM | POA: Insufficient documentation

## 2014-05-05 DIAGNOSIS — M51379 Other intervertebral disc degeneration, lumbosacral region without mention of lumbar back pain or lower extremity pain: Secondary | ICD-10-CM | POA: Insufficient documentation

## 2014-05-05 DIAGNOSIS — M5126 Other intervertebral disc displacement, lumbar region: Secondary | ICD-10-CM | POA: Insufficient documentation

## 2014-05-05 DIAGNOSIS — M47817 Spondylosis without myelopathy or radiculopathy, lumbosacral region: Secondary | ICD-10-CM

## 2014-05-05 DIAGNOSIS — M48061 Spinal stenosis, lumbar region without neurogenic claudication: Secondary | ICD-10-CM | POA: Insufficient documentation

## 2014-05-05 DIAGNOSIS — M5137 Other intervertebral disc degeneration, lumbosacral region: Secondary | ICD-10-CM | POA: Insufficient documentation

## 2014-05-05 DIAGNOSIS — M545 Low back pain, unspecified: Secondary | ICD-10-CM | POA: Insufficient documentation

## 2014-05-05 DIAGNOSIS — R29898 Other symptoms and signs involving the musculoskeletal system: Secondary | ICD-10-CM | POA: Insufficient documentation

## 2014-05-05 MED ORDER — GADOBENATE DIMEGLUMINE 529 MG/ML IV SOLN
20.0000 mL | Freq: Once | INTRAVENOUS | Status: AC | PRN
  Administered 2014-05-05: 20 mL via INTRAVENOUS

## 2014-05-10 ENCOUNTER — Ambulatory Visit: Payer: 59 | Admitting: Physical Therapy

## 2014-05-18 ENCOUNTER — Ambulatory Visit: Payer: 59 | Admitting: Physical Therapy

## 2014-05-24 ENCOUNTER — Ambulatory Visit: Payer: 59 | Attending: Neurosurgery | Admitting: Physical Therapy

## 2014-05-24 DIAGNOSIS — M2560 Stiffness of unspecified joint, not elsewhere classified: Secondary | ICD-10-CM | POA: Insufficient documentation

## 2014-05-24 DIAGNOSIS — M545 Low back pain, unspecified: Secondary | ICD-10-CM | POA: Insufficient documentation

## 2014-05-24 DIAGNOSIS — Z5189 Encounter for other specified aftercare: Secondary | ICD-10-CM | POA: Insufficient documentation

## 2014-05-31 ENCOUNTER — Ambulatory Visit: Payer: 59 | Admitting: Physical Therapy

## 2014-06-02 ENCOUNTER — Encounter: Payer: 59 | Admitting: Rehabilitation

## 2014-06-09 ENCOUNTER — Encounter: Payer: 59 | Admitting: Physical Therapy

## 2014-06-14 ENCOUNTER — Encounter: Payer: 59 | Admitting: Physical Therapy

## 2014-06-21 ENCOUNTER — Ambulatory Visit: Payer: 59 | Admitting: Physical Therapy

## 2014-06-28 ENCOUNTER — Telehealth: Payer: Self-pay | Admitting: *Deleted

## 2014-06-28 NOTE — Telephone Encounter (Signed)
[facility] Triage Call Report Triage Record Num: [id number] Operator: Marshell Garfinkel Patient Name: Garrett [name] Call Date & Time: 06/26/2014 9:05:06AM Patient Phone: [phone] PCP: Patient Gender: Male PCP Fax : Patient DOB: 12-14-1951 Practice Name: [facility] Reason for Call: Caller: Judy/Spouse; PCP: Gwendolyn Grant (Adults only); CB#: [phone]; Call regarding Medication Issue; Medication(s): victoza; Spouse forgot to pick up his Victoza on Thursday at [facility]. They are now closed and pt is out of his medication. RN checked [facility]. Pt has an order from 03/23/14 for Victoza 18mg /48ml SOPN. Inject 0.7ml [place] daily. Dispense 32ml. RN called this into [facility] [phone]. Spouse notified per pharamacist that insurance has already filled it/she was going to try for a holiday over-ride and call her back. Protocol(s) Used: Medication Questions - Adult Recommended Outcome per Protocol: Provided Health Information Reason for Outcome: Caller has medication question(s) that was answered with available resources Care Advice: ~ 07/

## 2014-06-30 ENCOUNTER — Other Ambulatory Visit: Payer: Self-pay | Admitting: General Practice

## 2014-06-30 ENCOUNTER — Other Ambulatory Visit: Payer: Self-pay | Admitting: Internal Medicine

## 2014-06-30 MED ORDER — TERAZOSIN HCL 5 MG PO CAPS: 5.0000 mg | ORAL_CAPSULE | Freq: Every day | ORAL | Status: DC

## 2014-06-30 MED ORDER — RAMIPRIL 10 MG PO CAPS: 10.0000 mg | ORAL_CAPSULE | Freq: Every day | ORAL | Status: DC

## 2014-06-30 MED ORDER — PRAVASTATIN SODIUM 20 MG PO TABS: 20.0000 mg | ORAL_TABLET | Freq: Every day | ORAL | Status: DC

## 2014-07-08 ENCOUNTER — Other Ambulatory Visit: Payer: Self-pay | Admitting: Internal Medicine

## 2014-07-11 ENCOUNTER — Telehealth: Payer: Self-pay | Admitting: Gastroenterology

## 2014-07-11 ENCOUNTER — Encounter: Payer: Self-pay | Admitting: Internal Medicine

## 2014-07-11 NOTE — Telephone Encounter (Signed)
Wife states pt has a very painful thrombosed hemorrhoid and she is requesting pt be seen to have this banded. Explained to wife that we only band internal hemorrhoids and that thrombosed hemorrhoids are taken care of by a surgeon. Wife states he may go to an urgent care to see if they can treat it.

## 2014-08-01 ENCOUNTER — Other Ambulatory Visit: Payer: Self-pay | Admitting: Internal Medicine

## 2014-08-31 ENCOUNTER — Other Ambulatory Visit: Payer: Self-pay

## 2014-08-31 ENCOUNTER — Encounter: Payer: Self-pay | Admitting: Internal Medicine

## 2014-08-31 MED ORDER — GLUCOSE BLOOD VI STRP: ORAL_STRIP | Status: DC

## 2014-08-31 MED ORDER — METOPROLOL TARTRATE 100 MG PO TABS: 100.0000 mg | ORAL_TABLET | Freq: Two times a day (BID) | ORAL | Status: DC

## 2014-08-31 MED ORDER — METFORMIN HCL 500 MG PO TABS: 500.0000 mg | ORAL_TABLET | Freq: Every day | ORAL | Status: DC

## 2014-09-01 ENCOUNTER — Other Ambulatory Visit: Payer: Self-pay

## 2014-09-01 MED ORDER — GLUCOSE BLOOD VI STRP: ORAL_STRIP | Status: DC

## 2014-09-21 ENCOUNTER — Other Ambulatory Visit: Payer: Self-pay | Admitting: Internal Medicine

## 2014-09-21 ENCOUNTER — Telehealth: Payer: Self-pay | Admitting: Internal Medicine

## 2014-09-21 NOTE — Telephone Encounter (Signed)
Pharmacy is needing clarification on script

## 2014-09-22 ENCOUNTER — Ambulatory Visit (INDEPENDENT_AMBULATORY_CARE_PROVIDER_SITE_OTHER): Payer: 59 | Admitting: Pulmonary Disease

## 2014-09-22 ENCOUNTER — Encounter: Payer: Self-pay | Admitting: Pulmonary Disease

## 2014-09-22 VITALS — BP 110/80 | HR 78 | Temp 97.0°F | Ht 69.0 in | Wt 209.9 lb

## 2014-09-22 DIAGNOSIS — G4733 Obstructive sleep apnea (adult) (pediatric): Secondary | ICD-10-CM

## 2014-09-22 NOTE — Telephone Encounter (Signed)
Called pharmacy yesterday.  No answer. Will try again today.

## 2014-09-22 NOTE — Telephone Encounter (Signed)
Spoke with pharm and they wanted to confirm brand vs. Generic. Informed them that generic is what he normally gets and they confirmed.

## 2014-09-22 NOTE — Assessment & Plan Note (Signed)
The patient is now doing very well with CPAP on the automatic setting. He is sleeping well with the device, and has lost significant weight since the last visit. I've encouraged him to continue with this, and he may be able to get rid of his CPAP if he can drop another 20 pounds.

## 2014-09-22 NOTE — Patient Instructions (Signed)
Continue on CPAP, and keep up with mask changes and supplies. Keep working on weight loss, you were doing very well. If you lose another 20 pounds prior to our next visit, please call me. Followup with me again in one year.

## 2014-09-22 NOTE — Progress Notes (Signed)
   Subjective:    Patient ID: Garrett [name], male    DOB: Dec 12, 1951, 63 y.o.   MRN: [id number]  HPI Patient comes in today for followup of his obstructive sleep apnea. He is wearing CPAP compliantly, and is doing much better with pressure tolerance.  He currently feels that he is sleeping well, and is satisfied with his daytime alertness. He has lost over 20 pounds since the last visit.   Review of Systems  Constitutional: Negative for fever and unexpected weight change.  HENT: Negative for congestion, dental problem, ear pain, nosebleeds, postnasal drip, rhinorrhea, sinus pressure, sneezing, sore throat and trouble swallowing.   Eyes: Negative for redness and itching.  Respiratory: Negative for cough, chest tightness, shortness of breath and wheezing.   Cardiovascular: Negative for palpitations and leg swelling.  Gastrointestinal: Negative for nausea and vomiting.  Genitourinary: Negative for dysuria.  Musculoskeletal: Negative for joint swelling.  Skin: Negative for rash.  Neurological: Negative for headaches.  Hematological: Does not bruise/bleed easily.  Psychiatric/Behavioral: Negative for dysphoric mood. The patient is not nervous/anxious.        Objective:   Physical Exam Overweight male in no acute distress Nose without purulence or discharge noted No skin breakdown or pressure necrosis from the CPAP Neck without lymphadenopathy or thyromegaly Lower extremities without significant edema, no cyanosis Alert and oriented, moves all 4 extremities.       Assessment & Plan:

## 2014-09-26 ENCOUNTER — Encounter: Payer: Self-pay | Admitting: Internal Medicine

## 2014-09-26 ENCOUNTER — Ambulatory Visit (INDEPENDENT_AMBULATORY_CARE_PROVIDER_SITE_OTHER): Payer: 59 | Admitting: Internal Medicine

## 2014-09-26 ENCOUNTER — Other Ambulatory Visit (INDEPENDENT_AMBULATORY_CARE_PROVIDER_SITE_OTHER): Payer: 59

## 2014-09-26 VITALS — BP 122/72 | HR 64 | Temp 97.7°F | Ht 69.0 in | Wt 214.5 lb

## 2014-09-26 DIAGNOSIS — IMO0002 Reserved for concepts with insufficient information to code with codable children: Secondary | ICD-10-CM

## 2014-09-26 DIAGNOSIS — I10 Essential (primary) hypertension: Secondary | ICD-10-CM

## 2014-09-26 DIAGNOSIS — E1169 Type 2 diabetes mellitus with other specified complication: Secondary | ICD-10-CM

## 2014-09-26 DIAGNOSIS — E1165 Type 2 diabetes mellitus with hyperglycemia: Secondary | ICD-10-CM

## 2014-09-26 DIAGNOSIS — E785 Hyperlipidemia, unspecified: Secondary | ICD-10-CM

## 2014-09-26 DIAGNOSIS — Z23 Encounter for immunization: Secondary | ICD-10-CM

## 2014-09-26 LAB — HEMOGLOBIN A1C: HEMOGLOBIN A1C: 5.6 % (ref 4.6–6.5)

## 2014-09-26 MED ORDER — HYDROCODONE-ACETAMINOPHEN 5-325 MG PO TABS: 1.0000 | ORAL_TABLET | Freq: Two times a day (BID) | ORAL | Status: DC | PRN

## 2014-09-26 MED ORDER — METFORMIN HCL 500 MG PO TABS: ORAL_TABLET | ORAL | Status: DC

## 2014-09-26 MED ORDER — VICTOZA 18 MG/3ML ~~LOC~~ SOPN: PEN_INJECTOR | SUBCUTANEOUS | Status: DC

## 2014-09-26 MED ORDER — METOPROLOL TARTRATE 100 MG PO TABS: 50.0000 mg | ORAL_TABLET | Freq: Two times a day (BID) | ORAL | Status: DC

## 2014-09-26 NOTE — Assessment & Plan Note (Signed)
On prava Check lipids annually, goal LDL<100 

## 2014-09-26 NOTE — Patient Instructions (Signed)
It was good to see you today.  We have reviewed your prior records including labs and tests today  Your annual flu shot was given and/or updated today.  Test(s) ordered today. Your results will be released to [place] (or called to you) after review, usually within 72hours after test completion. If any changes need to be made, you will be notified at that same time.  Medications reviewed and updated Take 1/2 of lopressor twice daily (= 50mg  twice daily) for blood pressure  IF a1c> 7, we will resume one of your diabetes mellitus medications  Please schedule followup in 6 months, call sooner if problems.

## 2014-09-26 NOTE — Progress Notes (Signed)
Pre visit review using our clinic review tool, if applicable. No additional management support is needed unless otherwise documented below in the visit note. 
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
## 2014-09-26 NOTE — Assessment & Plan Note (Addendum)
Not taking metformin or Victoza since 06/25/14 due to "crashing" hypoglycemia with new diet efforts Prev rx'd Victoza 08/2013 (low dose) to help with weight loss efforts and DM control -  Check a1c now and adjust tx as needed - resume Victoza if a1c >7 On ACEI, ASA 81 and statin (goal LDL <100) The patient is asked to make continued attempts to improve diet and exercise patterns to aid in medical management of this problem.  Lab Results  Component Value Date   HGBA1C 6.1 03/28/2014

## 2014-09-26 NOTE — Assessment & Plan Note (Signed)
BP Readings from Last 3 Encounters:  09/26/14 122/72  09/22/14 110/80  03/28/14 118/78   Multi med tx reviewed - self DC'd BID lopressor - only taking qd now Home BP log shows control is effective The current medical regimen is effective;  continue present plan and medications.

## 2014-09-27 ENCOUNTER — Telehealth: Payer: Self-pay | Admitting: Internal Medicine

## 2014-09-27 NOTE — Telephone Encounter (Signed)
emmi emailed °

## 2014-10-17 ENCOUNTER — Other Ambulatory Visit: Payer: Self-pay | Admitting: Neurosurgery

## 2014-10-17 DIAGNOSIS — M47817 Spondylosis without myelopathy or radiculopathy, lumbosacral region: Secondary | ICD-10-CM

## 2014-10-18 ENCOUNTER — Encounter: Payer: Self-pay | Admitting: Emergency Medicine

## 2014-10-18 ENCOUNTER — Emergency Department
Admission: EM | Admit: 2014-10-18 | Discharge: 2014-10-18 | Disposition: A | Discharge status: Home / Self Care | Payer: 59 | Source: Home / Self Care | Attending: Physician Assistant | Admitting: Physician Assistant

## 2014-10-18 ENCOUNTER — Telehealth: Payer: Self-pay | Admitting: *Deleted

## 2014-10-18 DIAGNOSIS — R109 Unspecified abdominal pain: Secondary | ICD-10-CM

## 2014-10-18 DIAGNOSIS — M5416 Radiculopathy, lumbar region: Secondary | ICD-10-CM

## 2014-10-18 DIAGNOSIS — R1010 Upper abdominal pain, unspecified: Secondary | ICD-10-CM

## 2014-10-18 LAB — POCT URINALYSIS DIP (MANUAL ENTRY)
Bilirubin, UA: NEGATIVE
Blood, UA: NEGATIVE
Glucose, UA: NEGATIVE
Ketones, POC UA: NEGATIVE
Leukocytes, UA: NEGATIVE
Nitrite, UA: NEGATIVE
PROTEIN UA: NEGATIVE
SPEC GRAV UA: 1.02 (ref 1.005–1.03)
UROBILINOGEN UA: 0.2 (ref 0–1)
pH, UA: 6 (ref 5–8)

## 2014-10-18 LAB — POCT CBC W AUTO DIFF (K'VILLE URGENT CARE)

## 2014-10-18 LAB — COMPREHENSIVE METABOLIC PANEL
ALBUMIN: 4 g/dL (ref 3.5–5.2)
ALT: 44 U/L (ref 0–53)
AST: 21 U/L (ref 0–37)
Alkaline Phosphatase: 101 U/L (ref 39–117)
BUN: 18 mg/dL (ref 6–23)
CALCIUM: 9.5 mg/dL (ref 8.4–10.5)
CHLORIDE: 112 meq/L (ref 96–112)
CO2: 22 meq/L (ref 19–32)
CREATININE: 1.08 mg/dL (ref 0.50–1.35)
Glucose, Bld: 105 mg/dL — ABNORMAL HIGH (ref 70–99)
POTASSIUM: 4 meq/L (ref 3.5–5.3)
Sodium: 145 mEq/L (ref 135–145)
TOTAL PROTEIN: 6.4 g/dL (ref 6.0–8.3)
Total Bilirubin: 0.3 mg/dL (ref 0.2–1.2)

## 2014-10-18 MED ORDER — CYCLOBENZAPRINE HCL 10 MG PO TABS: ORAL_TABLET | ORAL | Status: DC

## 2014-10-18 NOTE — Discharge Instructions (Signed)
Follow up with dr. Trenton Gammon.  Will call with CMP results.   Consider heating pad and muscle relaxer with other treatment.   Low Back Sprain with Rehab  A sprain is an injury in which a ligament is torn. The ligaments of the lower back are vulnerable to sprains. However, they are strong and require great force to be injured. These ligaments are important for stabilizing the spinal column. Sprains are classified into three categories. Grade 1 sprains cause pain, but the tendon is not lengthened. Grade 2 sprains include a lengthened ligament, due to the ligament being stretched or partially ruptured. With grade 2 sprains there is still function, although the function may be decreased. Grade 3 sprains involve a complete tear of the tendon or muscle, and function is usually impaired. SYMPTOMS   Severe pain in the lower back.  Sometimes, a feeling of a "pop," "snap," or tear, at the time of injury.  Tenderness and sometimes swelling at the injury site.  Uncommonly, bruising (contusion) within 48 hours of injury.  Muscle spasms in the back. CAUSES  Low back sprains occur when a force is placed on the ligaments that is greater than they can handle. Common causes of injury include:  Performing a stressful act while off-balance.  Repetitive stressful activities that involve movement of the lower back.  Direct hit (trauma) to the lower back. RISK INCREASES WITH:  Contact sports (football, wrestling).  Collisions (major skiing accidents).  Sports that require throwing or lifting (baseball, weightlifting).  Sports involving twisting of the spine (gymnastics, diving, tennis, golf).  Poor strength and flexibility.  Inadequate protection.  Previous back injury or surgery (especially fusion). PREVENTION  Wear properly fitted and padded protective equipment.  Warm up and stretch properly before activity.  Allow for adequate recovery between workouts.  Maintain physical  fitness:  Strength, flexibility, and endurance.  Cardiovascular fitness.  Maintain a healthy body weight. PROGNOSIS  If treated properly, low back sprains usually heal with non-surgical treatment. The length of time for healing depends on the severity of the injury.  RELATED COMPLICATIONS   Recurring symptoms, resulting in a chronic problem.  Chronic inflammation and pain in the low back.  Delayed healing or resolution of symptoms, especially if activity is resumed too soon.  Prolonged impairment.  Unstable or arthritic joints of the low back. TREATMENT  Treatment first involves the use of ice and medicine, to reduce pain and inflammation. The use of strengthening and stretching exercises may help reduce pain with activity. These exercises may be performed at home or with a therapist. Severe injuries may require referral to a therapist for further evaluation and treatment, such as ultrasound. Your caregiver may advise that you wear a back brace or corset, to help reduce pain and discomfort. Often, prolonged bed rest results in greater harm then benefit. Corticosteroid injections may be recommended. However, these should be reserved for the most serious cases. It is important to avoid using your back when lifting objects. At night, sleep on your back on a firm mattress, with a pillow placed under your knees. If non-surgical treatment is unsuccessful, surgery may be needed.  MEDICATION   If pain medicine is needed, nonsteroidal anti-inflammatory medicines (aspirin and ibuprofen), or other minor pain relievers (acetaminophen), are often advised.  Do not take pain medicine for 7 days before surgery.  Prescription pain relievers may be given, if your caregiver thinks they are needed. Use only as directed and only as much as you need.  Ointments applied to  the skin may be helpful.  Corticosteroid injections may be given by your caregiver. These injections should be reserved for the most  serious cases, because they may only be given a certain number of times. HEAT AND COLD  Cold treatment (icing) should be applied for 10 to 15 minutes every 2 to 3 hours for inflammation and pain, and immediately after activity that aggravates your symptoms. Use ice packs or an ice massage.  Heat treatment may be used before performing stretching and strengthening activities prescribed by your caregiver, physical therapist, or athletic trainer. Use a heat pack or a warm water soak. SEEK MEDICAL CARE IF:   Symptoms get worse or do not improve in 2 to 4 weeks, despite treatment.  You develop numbness or weakness in either leg.  You lose bowel or bladder function.  Any of the following occur after surgery: fever, increased pain, swelling, redness, drainage of fluids, or bleeding in the affected area.  New, unexplained symptoms develop. (Drugs used in treatment may produce side effects.) EXERCISES  RANGE OF MOTION (ROM) AND STRETCHING EXERCISES - Low Back Sprain Most people with lower back pain will find that their symptoms get worse with excessive bending forward (flexion) or arching at the lower back (extension). The exercises that will help resolve your symptoms will focus on the opposite motion.  Your physician, physical therapist or athletic trainer will help you determine which exercises will be most helpful to resolve your lower back pain. Do not complete any exercises without first consulting with your caregiver. Discontinue any exercises which make your symptoms worse, until you speak to your caregiver. If you have pain, numbness or tingling which travels down into your buttocks, leg or foot, the goal of the therapy is for these symptoms to move closer to your back and eventually resolve. Sometimes, these leg symptoms will get better, but your lower back pain may worsen. This is often an indication of progress in your rehabilitation. Be very alert to any changes in your symptoms and the  activities in which you participated in the 24 hours prior to the change. Sharing this information with your caregiver will allow him or her to most efficiently treat your condition. These exercises may help you when beginning to rehabilitate your injury. Your symptoms may resolve with or without further involvement from your physician, physical therapist or athletic trainer. While completing these exercises, remember:   Restoring tissue flexibility helps normal motion to return to the joints. This allows healthier, less painful movement and activity.  An effective stretch should be held for at least 30 seconds.  A stretch should never be painful. You should only feel a gentle lengthening or release in the stretched tissue. FLEXION RANGE OF MOTION AND STRETCHING EXERCISES: STRETCH - Flexion, Single Knee to Chest   Lie on a firm bed or floor with both legs extended in front of you.  Keeping one leg in contact with the floor, bring your opposite knee to your chest. Hold your leg in place by either grabbing behind your thigh or at your knee.  Pull until you feel a gentle stretch in your low back. Hold __________ seconds.  Slowly release your grasp and repeat the exercise with the opposite side. Repeat __________ times. Complete this exercise __________ times per day.  STRETCH - Flexion, Double Knee to Chest  Lie on a firm bed or floor with both legs extended in front of you.  Keeping one leg in contact with the floor, bring your opposite knee  to your chest.  Tense your stomach muscles to support your back and then lift your other knee to your chest. Hold your legs in place by either grabbing behind your thighs or at your knees.  Pull both knees toward your chest until you feel a gentle stretch in your low back. Hold __________ seconds.  Tense your stomach muscles and slowly return one leg at a time to the floor. Repeat __________ times. Complete this exercise __________ times per day.   STRETCH - Low Trunk Rotation  Lie on a firm bed or floor. Keeping your legs in front of you, bend your knees so they are both pointed toward the ceiling and your feet are flat on the floor.  Extend your arms out to the side. This will stabilize your upper body by keeping your shoulders in contact with the floor.  Gently and slowly drop both knees together to one side until you feel a gentle stretch in your low back. Hold for __________ seconds.  Tense your stomach muscles to support your lower back as you bring your knees back to the starting position. Repeat the exercise to the other side. Repeat __________ times. Complete this exercise __________ times per day  EXTENSION RANGE OF MOTION AND FLEXIBILITY EXERCISES: STRETCH - Extension, Prone on Elbows   Lie on your stomach on the floor, a bed will be too soft. Place your palms about shoulder width apart and at the height of your head.  Place your elbows under your shoulders. If this is too painful, stack pillows under your chest.  Allow your body to relax so that your hips drop lower and make contact more completely with the floor.  Hold this position for __________ seconds.  Slowly return to lying flat on the floor. Repeat __________ times. Complete this exercise __________ times per day.  RANGE OF MOTION - Extension, Prone Press Ups  Lie on your stomach on the floor, a bed will be too soft. Place your palms about shoulder width apart and at the height of your head.  Keeping your back as relaxed as possible, slowly straighten your elbows while keeping your hips on the floor. You may adjust the placement of your hands to maximize your comfort. As you gain motion, your hands will come more underneath your shoulders.  Hold this position __________ seconds.  Slowly return to lying flat on the floor. Repeat __________ times. Complete this exercise __________ times per day.  RANGE OF MOTION- Quadruped, Neutral Spine   Assume a hands  and knees position on a firm surface. Keep your hands under your shoulders and your knees under your hips. You may place padding under your knees for comfort.  Drop your head and point your tailbone toward the ground below you. This will round out your lower back like an angry cat. Hold this position for __________ seconds.  Slowly lift your head and release your tail bone so that your back sags into a large arch, like an old horse.  Hold this position for __________ seconds.  Repeat this until you feel limber in your low back.  Now, find your "sweet spot." This will be the most comfortable position somewhere between the two previous positions. This is your neutral spine. Once you have found this position, tense your stomach muscles to support your low back.  Hold this position for __________ seconds. Repeat __________ times. Complete this exercise __________ times per day.  STRENGTHENING EXERCISES - Low Back Sprain These exercises may help you when  beginning to rehabilitate your injury. These exercises should be done near your "sweet spot." This is the neutral, low-back arch, somewhere between fully rounded and fully arched, that is your least painful position. When performed in this safe range of motion, these exercises can be used for people who have either a flexion or extension based injury. These exercises may resolve your symptoms with or without further involvement from your physician, physical therapist or athletic trainer. While completing these exercises, remember:   Muscles can gain both the endurance and the strength needed for everyday activities through controlled exercises.  Complete these exercises as instructed by your physician, physical therapist or athletic trainer. Increase the resistance and repetitions only as guided.  You may experience muscle soreness or fatigue, but the pain or discomfort you are trying to eliminate should never worsen during these exercises. If this  pain does worsen, stop and make certain you are following the directions exactly. If the pain is still present after adjustments, discontinue the exercise until you can discuss the trouble with your caregiver. STRENGTHENING - Deep Abdominals, Pelvic Tilt   Lie on a firm bed or floor. Keeping your legs in front of you, bend your knees so they are both pointed toward the ceiling and your feet are flat on the floor.  Tense your lower abdominal muscles to press your low back into the floor. This motion will rotate your pelvis so that your tail bone is scooping upwards rather than pointing at your feet or into the floor. With a gentle tension and even breathing, hold this position for __________ seconds. Repeat __________ times. Complete this exercise __________ times per day.  STRENGTHENING - Abdominals, Crunches   Lie on a firm bed or floor. Keeping your legs in front of you, bend your knees so they are both pointed toward the ceiling and your feet are flat on the floor. Cross your arms over your chest.  Slightly tip your chin down without bending your neck.  Tense your abdominals and slowly lift your trunk high enough to just clear your shoulder blades. Lifting higher can put excessive stress on the lower back and does not further strengthen your abdominal muscles.  Control your return to the starting position. Repeat __________ times. Complete this exercise __________ times per day.  STRENGTHENING - Quadruped, Opposite UE/LE Lift   Assume a hands and knees position on a firm surface. Keep your hands under your shoulders and your knees under your hips. You may place padding under your knees for comfort.  Find your neutral spine and gently tense your abdominal muscles so that you can maintain this position. Your shoulders and hips should form a rectangle that is parallel with the floor and is not twisted.  Keeping your trunk steady, lift your right hand no higher than your shoulder and then your  left leg no higher than your hip. Make sure you are not holding your breath. Hold this position for __________ seconds.  Continuing to keep your abdominal muscles tense and your back steady, slowly return to your starting position. Repeat with the opposite arm and leg. Repeat __________ times. Complete this exercise __________ times per day.  STRENGTHENING - Abdominals and Quadriceps, Straight Leg Raise   Lie on a firm bed or floor with both legs extended in front of you.  Keeping one leg in contact with the floor, bend the other knee so that your foot can rest flat on the floor.  Find your neutral spine, and tense your abdominal  muscles to maintain your spinal position throughout the exercise.  Slowly lift your straight leg off the floor about 6 inches for a count of 15, making sure to not hold your breath.  Still keeping your neutral spine, slowly lower your leg all the way to the floor. Repeat this exercise with each leg __________ times. Complete this exercise __________ times per day. POSTURE AND BODY MECHANICS CONSIDERATIONS - Low Back Sprain Keeping correct posture when sitting, standing or completing your activities will reduce the stress put on different body tissues, allowing injured tissues a chance to heal and limiting painful experiences. The following are general guidelines for improved posture. Your physician or physical therapist will provide you with any instructions specific to your needs. While reading these guidelines, remember:  The exercises prescribed by your provider will help you have the flexibility and strength to maintain correct postures.  The correct posture provides the best environment for your joints to work. All of your joints have less wear and tear when properly supported by a spine with good posture. This means you will experience a healthier, less painful body.  Correct posture must be practiced with all of your activities, especially prolonged sitting and  standing. Correct posture is as important when doing repetitive low-stress activities (typing) as it is when doing a single heavy-load activity (lifting). RESTING POSITIONS Consider which positions are most painful for you when choosing a resting position. If you have pain with flexion-based activities (sitting, bending, stooping, squatting), choose a position that allows you to rest in a less flexed posture. You would want to avoid curling into a fetal position on your side. If your pain worsens with extension-based activities (prolonged standing, working overhead), avoid resting in an extended position such as sleeping on your stomach. Most people will find more comfort when they rest with their spine in a more neutral position, neither too rounded nor too arched. Lying on a non-sagging bed on your side with a pillow between your knees, or on your back with a pillow under your knees will often provide some relief. Keep in mind, being in any one position for a prolonged period of time, no matter how correct your posture, can still lead to stiffness. PROPER SITTING POSTURE In order to minimize stress and discomfort on your spine, you must sit with correct posture. Sitting with good posture should be effortless for a healthy body. Returning to good posture is a gradual process. Many people can work toward this most comfortably by using various supports until they have the flexibility and strength to maintain this posture on their own. When sitting with proper posture, your ears will fall over your shoulders and your shoulders will fall over your hips. You should use the back of the chair to support your upper back. Your lower back will be in a neutral position, just slightly arched. You may place a small pillow or folded towel at the base of your lower back for  support.  When working at a desk, create an environment that supports good, upright posture. Without extra support, muscles tire, which leads to  excessive strain on joints and other tissues. Keep these recommendations in mind: CHAIR:  A chair should be able to slide under your desk when your back makes contact with the back of the chair. This allows you to work closely.  The chair's height should allow your eyes to be level with the upper part of your monitor and your hands to be slightly lower than your  elbows. BODY POSITION  Your feet should make contact with the floor. If this is not possible, use a foot rest.  Keep your ears over your shoulders. This will reduce stress on your neck and low back. INCORRECT SITTING POSTURES  If you are feeling tired and unable to assume a healthy sitting posture, do not slouch or slump. This puts excessive strain on your back tissues, causing more damage and pain. Healthier options include:  Using more support, like a lumbar pillow.  Switching tasks to something that requires you to be upright or walking.  Talking a brief walk.  Lying down to rest in a neutral-spine position. PROLONGED STANDING WHILE SLIGHTLY LEANING FORWARD  When completing a task that requires you to lean forward while standing in one place for a long time, place either foot up on a stationary 2-4 inch high object to help maintain the best posture. When both feet are on the ground, the lower back tends to lose its slight inward curve. If this curve flattens (or becomes too large), then the back and your other joints will experience too much stress, tire more quickly, and can cause pain. CORRECT STANDING POSTURES Proper standing posture should be assumed with all daily activities, even if they only take a few moments, like when brushing your teeth. As in sitting, your ears should fall over your shoulders and your shoulders should fall over your hips. You should keep a slight tension in your abdominal muscles to brace your spine. Your tailbone should point down to the ground, not behind your body, resulting in an over-extended  swayback posture.  INCORRECT STANDING POSTURES  Common incorrect standing postures include a forward head, locked knees and/or an excessive swayback. WALKING Walk with an upright posture. Your ears, shoulders and hips should all line-up. PROLONGED ACTIVITY IN A FLEXED POSITION When completing a task that requires you to bend forward at your waist or lean over a low surface, try to find a way to stabilize 3 out of 4 of your limbs. You can place a hand or elbow on your thigh or rest a knee on the surface you are reaching across. This will provide you more stability, so that your muscles do not tire as quickly. By keeping your knees relaxed, or slightly bent, you will also reduce stress across your lower back. CORRECT LIFTING TECHNIQUES DO :  Assume a wide stance. This will provide you more stability and the opportunity to get as close as possible to the object which you are lifting.  Tense your abdominals to brace your spine. Bend at the knees and hips. Keeping your back locked in a neutral-spine position, lift using your leg muscles. Lift with your legs, keeping your back straight.  Test the weight of unknown objects before attempting to lift them.  Try to keep your elbows locked down at your sides in order get the best strength from your shoulders when carrying an object.  Always ask for help when lifting heavy or awkward objects. INCORRECT LIFTING TECHNIQUES DO NOT:   Lock your knees when lifting, even if it is a small object.  Bend and twist. Pivot at your feet or move your feet when needing to change directions.  Assume that you can safely pick up even a paperclip without proper posture. Document Released: 12/09/2005 Document Revised: 03/02/2012 Document Reviewed: 03/23/2009 [facility] Patient Information 2015 [place], [place]. This information is not intended to replace advice given to you by your health care provider. Make sure you discuss any  questions you have with your health care  provider.
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
## 2014-10-18 NOTE — ED Notes (Signed)
Garrett [name] c/o left flank pain 2 weeks. C/o chronic back pain but "this pain is different". Reports pain moves around but stays in left flank. Denies hx of kidney stone or urinary changes. Recently changed to high protein and low carb diet.

## 2014-10-21 ENCOUNTER — Ambulatory Visit
Admission: RE | Admit: 2014-10-21 | Discharge: 2014-10-21 | Disposition: A | Discharge status: Home / Self Care | Payer: 59 | Source: Ambulatory Visit | Attending: Neurosurgery | Admitting: Neurosurgery

## 2014-10-21 VITALS — BP 117/72 | HR 54 | Ht 69.0 in | Wt 210.0 lb

## 2014-10-21 DIAGNOSIS — M47817 Spondylosis without myelopathy or radiculopathy, lumbosacral region: Secondary | ICD-10-CM

## 2014-10-21 MED ORDER — IOHEXOL 180 MG/ML  SOLN
15.0000 mL | Freq: Once | INTRAMUSCULAR | Status: AC | PRN
  Administered 2014-10-21: 15 mL via INTRATHECAL

## 2014-10-21 MED ORDER — DIAZEPAM 5 MG PO TABS
5.0000 mg | ORAL_TABLET | Freq: Once | ORAL | Status: AC
  Administered 2014-10-21: 5 mg via ORAL

## 2014-10-21 NOTE — Progress Notes (Signed)
Pt states he has not taken Imitrex in the last few months.  Discharge instructions explained to pt.

## 2014-10-21 NOTE — Discharge Instructions (Addendum)
Myelogram Discharge Instructions  1. Go home and rest quietly for the next 24 hours.  It is important to lie flat for the next 24 hours.  Get up only to go to the restroom.  You may lie in the bed or on a couch on your back, your stomach, your left side or your right side.  You may have one pillow under your head.  You may have pillows between your knees while you are on your side or under your knees while you are on your back.  2. DO NOT drive today.  Recline the seat as far back as it will go, while still wearing your seat belt, on the way home.  3. You may get up to go to the bathroom as needed.  You may sit up for 10 minutes to eat.  You may resume your normal diet and medications unless otherwise indicated.  Drink lots of extra fluids today and tomorrow.  4. The incidence of headache, nausea, or vomiting is about 5% (one in 20 patients).  If you develop a headache, lie flat and drink plenty of fluids until the headache goes away.  Caffeinated beverages may be helpful.  If you develop severe nausea and vomiting or a headache that does not go away with flat bed rest, call [phone].  5. You may resume normal activities after your 24 hours of bed rest is over; however, do not exert yourself strongly or do any heavy lifting tomorrow. If when you get up you have a headache when standing, go back to bed and force fluids for another 24 hours.  6. Call your physician for a follow-up appointment.  The results of your myelogram will be sent directly to your physician by the following day.  7. If you have any questions or if complications develop after you arrive home, please call [phone].  Discharge instructions have been explained to the patient.  The patient, or the person responsible for the patient, fully understands these instructions.      May resume Imitrex on Oct. 31, 2015, after 11:00 am.

## 2014-10-25 ENCOUNTER — Other Ambulatory Visit: Payer: Self-pay | Admitting: Neurosurgery

## 2014-11-02 ENCOUNTER — Telehealth: Payer: Self-pay

## 2014-11-02 ENCOUNTER — Other Ambulatory Visit: Payer: Self-pay

## 2014-11-02 NOTE — Telephone Encounter (Signed)
Spoke to pharmacy and gave MD indication that generic is okay to dispense.

## 2014-11-02 NOTE — Telephone Encounter (Signed)
Ok with me to change to generic - please let pharmacy know same

## 2014-11-02 NOTE — Telephone Encounter (Signed)
[facility] sent fax indicating that [facility] will now cost $ 225.00.  It now comes in generic and asks if they can switch to the generic.    Fax number [phone] Phone: [phone]

## 2014-11-04 NOTE — Pre-Procedure Instructions (Signed)
Garrett [name]  11/04/2014   Your procedure is scheduled on:  Mon, Nov 23 @ 11:00 AM  Report to [facility]  at 9:00 AM.  Call this number if you have problems the morning of surgery: [phone]   Remember:   Do not eat food or drink liquids after midnight.   Take these medicines the morning of surgery with A SIP OF WATER: Allopurinol(Zyloprim),Pain Pill(if needed),Namenda(Memantine),Metoprolol(Lopressor),and Pantoprazole(Protonix)                 Stop taking your Aspirin. No Goody's,BC's,Aleve,Ibuprofen,Fish Oil,or any Herbal Medications   Do not wear jewelry  Do not wear lotions, powders, or colognes. You may wear deodorant.  Men may shave face and neck.  Do not bring valuables to the hospital.  [facility] is not responsible                  for any belongings or valuables.               Contacts, dentures or bridgework may not be worn into surgery.  Leave suitcase in the car. After surgery it may be brought to your room.  For patients admitted to the hospital, discharge time is determined by your                treatment team.                  Special Instructions:  [place] - Preparing for Surgery  Before surgery, you can play an important role.  Because skin is not sterile, your skin needs to be as free of germs as possible.  You can reduce the number of germs on you skin by washing with CHG (chlorahexidine gluconate) soap before surgery.  CHG is an antiseptic cleaner which kills germs and bonds with the skin to continue killing germs even after washing.  Please DO NOT use if you have an allergy to CHG or antibacterial soaps.  If your skin becomes reddened/irritated stop using the CHG and inform your nurse when you arrive at Short Stay.  Do not shave (including legs and underarms) for at least 48 hours prior to the first CHG shower.  You may shave your face.  Please follow these instructions carefully:   1.  Shower with CHG Soap the night before surgery and  the                                morning of Surgery.  2.  If you choose to wash your hair, wash your hair first as usual with your       normal shampoo.  3.  After you shampoo, rinse your hair and body thoroughly to remove the                      Shampoo.  4.  Use CHG as you would any other liquid soap.  You can apply chg directly       to the skin and wash gently with scrungie or a clean washcloth.  5.  Apply the CHG Soap to your body ONLY FROM THE NECK DOWN.        Do not use on open wounds or open sores.  Avoid contact with your eyes,       ears, mouth and genitals (private parts).  Wash genitals (private parts)       with your normal  soap.  6.  Wash thoroughly, paying special attention to the area where your surgery        will be performed.  7.  Thoroughly rinse your body with warm water from the neck down.  8.  DO NOT shower/wash with your normal soap after using and rinsing off       the CHG Soap.  9.  Pat yourself dry with a clean towel.            10.  Wear clean pajamas.            11.  Place clean sheets on your bed the night of your first shower and do not        sleep with pets.  Day of Surgery  Do not apply any lotions/deoderants the morning of surgery.  Please wear clean clothes to the hospital/surgery center.     Please read over the following fact sheets that you were given: Pain Booklet, Coughing and Deep Breathing, Blood Transfusion Information, MRSA Information and Surgical Site Infection Prevention

## 2014-11-07 ENCOUNTER — Encounter (HOSPITAL_COMMUNITY)
Admission: RE | Admit: 2014-11-07 | Discharge: 2014-11-07 | Disposition: A | Discharge status: Home / Self Care | Payer: 59 | Source: Ambulatory Visit | Attending: Neurosurgery | Admitting: Neurosurgery

## 2014-11-07 ENCOUNTER — Encounter (HOSPITAL_COMMUNITY): Payer: Self-pay

## 2014-11-07 ENCOUNTER — Other Ambulatory Visit: Payer: Self-pay

## 2014-11-07 DIAGNOSIS — Z01818 Encounter for other preprocedural examination: Secondary | ICD-10-CM | POA: Diagnosis present

## 2014-11-07 DIAGNOSIS — G43909 Migraine, unspecified, not intractable, without status migrainosus: Secondary | ICD-10-CM | POA: Diagnosis not present

## 2014-11-07 DIAGNOSIS — J45909 Unspecified asthma, uncomplicated: Secondary | ICD-10-CM | POA: Insufficient documentation

## 2014-11-07 DIAGNOSIS — E118 Type 2 diabetes mellitus with unspecified complications: Secondary | ICD-10-CM | POA: Insufficient documentation

## 2014-11-07 DIAGNOSIS — R413 Other amnesia: Secondary | ICD-10-CM | POA: Diagnosis not present

## 2014-11-07 DIAGNOSIS — G4733 Obstructive sleep apnea (adult) (pediatric): Secondary | ICD-10-CM | POA: Diagnosis not present

## 2014-11-07 DIAGNOSIS — M109 Gout, unspecified: Secondary | ICD-10-CM | POA: Diagnosis not present

## 2014-11-07 DIAGNOSIS — Z981 Arthrodesis status: Secondary | ICD-10-CM | POA: Insufficient documentation

## 2014-11-07 DIAGNOSIS — K219 Gastro-esophageal reflux disease without esophagitis: Secondary | ICD-10-CM | POA: Insufficient documentation

## 2014-11-07 DIAGNOSIS — I10 Essential (primary) hypertension: Secondary | ICD-10-CM | POA: Diagnosis not present

## 2014-11-07 DIAGNOSIS — Z87891 Personal history of nicotine dependence: Secondary | ICD-10-CM | POA: Diagnosis not present

## 2014-11-07 HISTORY — DX: Sleep apnea, unspecified: G47.30

## 2014-11-07 LAB — CBC WITH DIFFERENTIAL/PLATELET
BASOS ABS: 0 10*3/uL (ref 0.0–0.1)
Basophils Relative: 1 % (ref 0–1)
Eosinophils Absolute: 0.2 10*3/uL (ref 0.0–0.7)
Eosinophils Relative: 3 % (ref 0–5)
HCT: 42.9 % (ref 39.0–52.0)
Hemoglobin: 14.5 g/dL (ref 13.0–17.0)
LYMPHS PCT: 21 % (ref 12–46)
Lymphs Abs: 1.7 10*3/uL (ref 0.7–4.0)
MCH: 31.9 pg (ref 26.0–34.0)
MCHC: 33.8 g/dL (ref 30.0–36.0)
MCV: 94.3 fL (ref 78.0–100.0)
Monocytes Absolute: 0.6 10*3/uL (ref 0.1–1.0)
Monocytes Relative: 7 % (ref 3–12)
NEUTROS ABS: 5.6 10*3/uL (ref 1.7–7.7)
NEUTROS PCT: 68 % (ref 43–77)
PLATELETS: 210 10*3/uL (ref 150–400)
RBC: 4.55 MIL/uL (ref 4.22–5.81)
RDW: 13.6 % (ref 11.5–15.5)
WBC: 8.1 10*3/uL (ref 4.0–10.5)

## 2014-11-07 LAB — BASIC METABOLIC PANEL
Anion gap: 13 (ref 5–15)
BUN: 16 mg/dL (ref 6–23)
CHLORIDE: 108 meq/L (ref 96–112)
CO2: 21 meq/L (ref 19–32)
Calcium: 9.2 mg/dL (ref 8.4–10.5)
Creatinine, Ser: 1.02 mg/dL (ref 0.50–1.35)
GFR calc Af Amer: 88 mL/min — ABNORMAL LOW (ref >90)
GFR calc non Af Amer: 76 mL/min — ABNORMAL LOW (ref >90)
GLUCOSE: 114 mg/dL — AB (ref 70–99)
POTASSIUM: 3.6 meq/L — AB (ref 3.7–5.3)
Sodium: 142 mEq/L (ref 137–147)

## 2014-11-07 LAB — TYPE AND SCREEN
ABO/RH(D): A POS
Antibody Screen: NEGATIVE

## 2014-11-07 LAB — SURGICAL PCR SCREEN
MRSA, PCR: NEGATIVE
Staphylococcus aureus: NEGATIVE

## 2014-11-07 LAB — ABO/RH: ABO/RH(D): A POS

## 2014-11-08 ENCOUNTER — Encounter (HOSPITAL_COMMUNITY): Payer: Self-pay
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
## 2014-11-13 MED ORDER — DEXAMETHASONE SODIUM PHOSPHATE 10 MG/ML IJ SOLN: 10.0000 mg | INTRAMUSCULAR | Status: DC

## 2014-11-13 MED ORDER — CEFAZOLIN SODIUM-DEXTROSE 2-3 GM-% IV SOLR: 2.0000 g | INTRAVENOUS | Status: DC

## 2014-11-14 ENCOUNTER — Inpatient Hospital Stay (HOSPITAL_COMMUNITY)
Admission: RE | Admit: 2014-11-14 | Discharge: 2014-11-15 | DRG: 460 | Disposition: A | Discharge status: Home / Self Care | Payer: 59 | Source: Ambulatory Visit | Attending: Neurosurgery | Admitting: Neurosurgery

## 2014-11-14 ENCOUNTER — Inpatient Hospital Stay (HOSPITAL_COMMUNITY): Payer: 59 | Admitting: Anesthesiology

## 2014-11-14 ENCOUNTER — Encounter (HOSPITAL_COMMUNITY)
Admission: RE | Disposition: A | Discharge status: Home / Self Care | Payer: Self-pay | Source: Ambulatory Visit | Attending: Neurosurgery

## 2014-11-14 ENCOUNTER — Encounter (HOSPITAL_COMMUNITY): Payer: Self-pay | Admitting: *Deleted

## 2014-11-14 ENCOUNTER — Inpatient Hospital Stay (HOSPITAL_COMMUNITY): Payer: 59

## 2014-11-14 ENCOUNTER — Inpatient Hospital Stay (HOSPITAL_COMMUNITY): Payer: 59 | Admitting: Vascular Surgery

## 2014-11-14 DIAGNOSIS — M5126 Other intervertebral disc displacement, lumbar region: Secondary | ICD-10-CM | POA: Diagnosis present

## 2014-11-14 DIAGNOSIS — Z419 Encounter for procedure for purposes other than remedying health state, unspecified: Secondary | ICD-10-CM

## 2014-11-14 DIAGNOSIS — E1151 Type 2 diabetes mellitus with diabetic peripheral angiopathy without gangrene: Secondary | ICD-10-CM | POA: Diagnosis present

## 2014-11-14 DIAGNOSIS — Z7982 Long term (current) use of aspirin: Secondary | ICD-10-CM

## 2014-11-14 DIAGNOSIS — K219 Gastro-esophageal reflux disease without esophagitis: Secondary | ICD-10-CM | POA: Diagnosis present

## 2014-11-14 DIAGNOSIS — M48062 Spinal stenosis, lumbar region with neurogenic claudication: Secondary | ICD-10-CM | POA: Diagnosis present

## 2014-11-14 DIAGNOSIS — Z87891 Personal history of nicotine dependence: Secondary | ICD-10-CM

## 2014-11-14 DIAGNOSIS — Z981 Arthrodesis status: Secondary | ICD-10-CM | POA: Diagnosis not present

## 2014-11-14 DIAGNOSIS — M109 Gout, unspecified: Secondary | ICD-10-CM | POA: Diagnosis present

## 2014-11-14 DIAGNOSIS — M4806 Spinal stenosis, lumbar region: Secondary | ICD-10-CM | POA: Diagnosis present

## 2014-11-14 DIAGNOSIS — M549 Dorsalgia, unspecified: Secondary | ICD-10-CM | POA: Diagnosis present

## 2014-11-14 HISTORY — PX: MAXIMUM ACCESS (MAS)POSTERIOR LUMBAR INTERBODY FUSION (PLIF) 1 LEVEL: SHX6368

## 2014-11-14 LAB — GLUCOSE, CAPILLARY
Glucose-Capillary: 144 mg/dL — ABNORMAL HIGH (ref 70–99)
Glucose-Capillary: 126 mg/dL — ABNORMAL HIGH (ref 70–99)
Glucose-Capillary: 201 mg/dL — ABNORMAL HIGH (ref 70–99)
Glucose-Capillary: 268 mg/dL — ABNORMAL HIGH (ref 70–99)

## 2014-11-14 SURGERY — FOR MAXIMUM ACCESS (MAS) POSTERIOR LUMBAR INTERBODY FUSION (PLIF) 1 LEVEL
Anesthesia: General | Site: Back

## 2014-11-14 MED ORDER — DEXAMETHASONE SODIUM PHOSPHATE 10 MG/ML IJ SOLN
INTRAMUSCULAR | Status: AC
  Administered 2014-11-14: 10 mg via INTRAVENOUS
  Filled 2014-11-14: qty 1

## 2014-11-14 MED ORDER — HYDROMORPHONE HCL 1 MG/ML IJ SOLN
0.2500 mg | INTRAMUSCULAR | Status: DC | PRN
  Administered 2014-11-14: 0.25 mg via INTRAVENOUS
  Administered 2014-11-14: 0.5 mg via INTRAVENOUS
  Administered 2014-11-14: 0.25 mg via INTRAVENOUS

## 2014-11-14 MED ORDER — VANCOMYCIN HCL 1000 MG IV SOLR
INTRAVENOUS | Status: AC
  Filled 2014-11-14: qty 1000

## 2014-11-14 MED ORDER — OXYCODONE-ACETAMINOPHEN 5-325 MG PO TABS
1.0000 | ORAL_TABLET | ORAL | Status: DC | PRN
  Administered 2014-11-14 – 2014-11-15 (×3): 2 via ORAL
  Filled 2014-11-14 (×3): qty 2

## 2014-11-14 MED ORDER — FUROSEMIDE 20 MG PO TABS
20.0000 mg | ORAL_TABLET | Freq: Every day | ORAL | Status: DC
  Administered 2014-11-14 – 2014-11-15 (×2): 20 mg via ORAL
  Filled 2014-11-14 (×2): qty 1

## 2014-11-14 MED ORDER — POLYETHYLENE GLYCOL 3350 17 G PO PACK
17.0000 g | PACK | Freq: Every day | ORAL | Status: DC | PRN
  Filled 2014-11-14: qty 1

## 2014-11-14 MED ORDER — TOPIRAMATE 25 MG PO TABS
25.0000 mg | ORAL_TABLET | Freq: Every day | ORAL | Status: DC
  Administered 2014-11-15: 25 mg via ORAL
  Filled 2014-11-14: qty 1

## 2014-11-14 MED ORDER — ARTIFICIAL TEARS OP OINT
TOPICAL_OINTMENT | OPHTHALMIC | Status: DC | PRN
  Administered 2014-11-14: 1 via OPHTHALMIC

## 2014-11-14 MED ORDER — DIAZEPAM 5 MG PO TABS
ORAL_TABLET | ORAL | Status: AC
  Filled 2014-11-14: qty 1

## 2014-11-14 MED ORDER — ACETAMINOPHEN 325 MG PO TABS: 650.0000 mg | ORAL_TABLET | ORAL | Status: DC | PRN

## 2014-11-14 MED ORDER — PHENOL 1.4 % MT LIQD: 1.0000 | OROMUCOSAL | Status: DC | PRN

## 2014-11-14 MED ORDER — FLEET ENEMA 7-19 GM/118ML RE ENEM
1.0000 | ENEMA | Freq: Once | RECTAL | Status: AC | PRN
  Filled 2014-11-14: qty 1

## 2014-11-14 MED ORDER — ONDANSETRON HCL 4 MG/2ML IJ SOLN
INTRAMUSCULAR | Status: DC | PRN
  Administered 2014-11-14: 4 mg via INTRAVENOUS

## 2014-11-14 MED ORDER — VERAPAMIL HCL ER 240 MG PO TBCR
240.0000 mg | EXTENDED_RELEASE_TABLET | Freq: Every morning | ORAL | Status: DC
  Administered 2014-11-15: 240 mg via ORAL
  Filled 2014-11-14: qty 1

## 2014-11-14 MED ORDER — VANCOMYCIN HCL 1000 MG IV SOLR
INTRAVENOUS | Status: DC | PRN
  Administered 2014-11-14: 1000 mg via TOPICAL

## 2014-11-14 MED ORDER — DIAZEPAM 5 MG PO TABS
5.0000 mg | ORAL_TABLET | Freq: Four times a day (QID) | ORAL | Status: DC | PRN
  Administered 2014-11-14 – 2014-11-15 (×4): 5 mg via ORAL
  Filled 2014-11-14 (×3): qty 1

## 2014-11-14 MED ORDER — THROMBIN 20000 UNITS EX SOLR
CUTANEOUS | Status: DC | PRN
  Administered 2014-11-14: 12:00:00 via TOPICAL

## 2014-11-14 MED ORDER — LIDOCAINE HCL (CARDIAC) 20 MG/ML IV SOLN
INTRAVENOUS | Status: AC
  Filled 2014-11-14: qty 5

## 2014-11-14 MED ORDER — CEFAZOLIN SODIUM-DEXTROSE 2-3 GM-% IV SOLR
INTRAVENOUS | Status: AC
  Administered 2014-11-14: 2 g via INTRAVENOUS
  Filled 2014-11-14: qty 50

## 2014-11-14 MED ORDER — LACTATED RINGERS IV SOLN
INTRAVENOUS | Status: DC | PRN
  Administered 2014-11-14 (×3): via INTRAVENOUS

## 2014-11-14 MED ORDER — PROPOFOL 10 MG/ML IV BOLUS
INTRAVENOUS | Status: AC
  Filled 2014-11-14: qty 20

## 2014-11-14 MED ORDER — ALUM & MAG HYDROXIDE-SIMETH 200-200-20 MG/5ML PO SUSP: 30.0000 mL | Freq: Four times a day (QID) | ORAL | Status: DC | PRN

## 2014-11-14 MED ORDER — ARTIFICIAL TEARS OP OINT
TOPICAL_OINTMENT | OPHTHALMIC | Status: AC
Start: 2014-11-14 — End: 2014-11-14
  Filled 2014-11-14: qty 3.5

## 2014-11-14 MED ORDER — CEFAZOLIN SODIUM 1-5 GM-% IV SOLN
1.0000 g | Freq: Three times a day (TID) | INTRAVENOUS | Status: AC
  Administered 2014-11-14 – 2014-11-15 (×2): 1 g via INTRAVENOUS
  Filled 2014-11-14 (×2): qty 50

## 2014-11-14 MED ORDER — PROPOFOL 10 MG/ML IV BOLUS
INTRAVENOUS | Status: DC | PRN
  Administered 2014-11-14: 200 mg via INTRAVENOUS

## 2014-11-14 MED ORDER — SUCCINYLCHOLINE CHLORIDE 20 MG/ML IJ SOLN
INTRAMUSCULAR | Status: AC
  Filled 2014-11-14: qty 1

## 2014-11-14 MED ORDER — SODIUM CHLORIDE 0.9 % IJ SOLN: 3.0000 mL | INTRAMUSCULAR | Status: DC | PRN

## 2014-11-14 MED ORDER — LIDOCAINE HCL (CARDIAC) 20 MG/ML IV SOLN
INTRAVENOUS | Status: DC | PRN
  Administered 2014-11-14: 100 mg via INTRAVENOUS

## 2014-11-14 MED ORDER — PROPOFOL INFUSION 10 MG/ML OPTIME
INTRAVENOUS | Status: DC | PRN
  Administered 2014-11-14: 50 ug/kg/min via INTRAVENOUS

## 2014-11-14 MED ORDER — EPHEDRINE SULFATE 50 MG/ML IJ SOLN
INTRAMUSCULAR | Status: DC | PRN
  Administered 2014-11-14: 5 mg via INTRAVENOUS
  Administered 2014-11-14: 10 mg via INTRAVENOUS
  Administered 2014-11-14 (×3): 5 mg via INTRAVENOUS
  Administered 2014-11-14 (×2): 10 mg via INTRAVENOUS

## 2014-11-14 MED ORDER — ONDANSETRON HCL 4 MG/2ML IJ SOLN
INTRAMUSCULAR | Status: AC
  Filled 2014-11-14: qty 2

## 2014-11-14 MED ORDER — ACETAMINOPHEN 650 MG RE SUPP: 650.0000 mg | RECTAL | Status: DC | PRN

## 2014-11-14 MED ORDER — FENTANYL CITRATE 0.05 MG/ML IJ SOLN
INTRAMUSCULAR | Status: DC | PRN
  Administered 2014-11-14: 100 ug via INTRAVENOUS
  Administered 2014-11-14 (×3): 50 ug via INTRAVENOUS

## 2014-11-14 MED ORDER — PHENYLEPHRINE HCL 10 MG/ML IJ SOLN
INTRAMUSCULAR | Status: DC | PRN
  Administered 2014-11-14 (×2): 80 ug via INTRAVENOUS
  Administered 2014-11-14: 40 ug via INTRAVENOUS
  Administered 2014-11-14: 80 ug via INTRAVENOUS
  Administered 2014-11-14 (×5): 40 ug via INTRAVENOUS

## 2014-11-14 MED ORDER — HYDROMORPHONE HCL 1 MG/ML IJ SOLN
0.5000 mg | INTRAMUSCULAR | Status: DC | PRN
  Administered 2014-11-14 (×2): 1 mg via INTRAVENOUS
  Filled 2014-11-14 (×2): qty 1

## 2014-11-14 MED ORDER — MENTHOL 3 MG MT LOZG: 1.0000 | LOZENGE | OROMUCOSAL | Status: DC | PRN

## 2014-11-14 MED ORDER — TOPIRAMATE 25 MG PO TABS
50.0000 mg | ORAL_TABLET | Freq: Every day | ORAL | Status: DC
  Administered 2014-11-14: 50 mg via ORAL
  Filled 2014-11-14 (×2): qty 2

## 2014-11-14 MED ORDER — POTASSIUM GLUCONATE 550 MG PO TABS: 550.0000 mg | ORAL_TABLET | Freq: Every day | ORAL | Status: DC

## 2014-11-14 MED ORDER — FENTANYL CITRATE 0.05 MG/ML IJ SOLN
INTRAMUSCULAR | Status: AC
  Filled 2014-11-14: qty 5

## 2014-11-14 MED ORDER — PRAVASTATIN SODIUM 20 MG PO TABS
20.0000 mg | ORAL_TABLET | Freq: Every day | ORAL | Status: DC
  Administered 2014-11-14 – 2014-11-15 (×2): 20 mg via ORAL
  Filled 2014-11-14 (×2): qty 1

## 2014-11-14 MED ORDER — MEMANTINE HCL 10 MG PO TABS
20.0000 mg | ORAL_TABLET | Freq: Every day | ORAL | Status: DC
  Administered 2014-11-15: 20 mg via ORAL
  Filled 2014-11-14 (×2): qty 2

## 2014-11-14 MED ORDER — BISACODYL 10 MG RE SUPP: 10.0000 mg | Freq: Every day | RECTAL | Status: DC | PRN

## 2014-11-14 MED ORDER — RAMIPRIL 10 MG PO CAPS
10.0000 mg | ORAL_CAPSULE | Freq: Every day | ORAL | Status: DC
  Administered 2014-11-14 – 2014-11-15 (×2): 10 mg via ORAL
  Filled 2014-11-14 (×2): qty 1

## 2014-11-14 MED ORDER — ALLOPURINOL 100 MG PO TABS
100.0000 mg | ORAL_TABLET | Freq: Every day | ORAL | Status: DC
  Administered 2014-11-15: 100 mg via ORAL
  Filled 2014-11-14 (×2): qty 1

## 2014-11-14 MED ORDER — EPHEDRINE SULFATE 50 MG/ML IJ SOLN
INTRAMUSCULAR | Status: AC
  Filled 2014-11-14: qty 1

## 2014-11-14 MED ORDER — HYDROCODONE-ACETAMINOPHEN 5-325 MG PO TABS: 1.0000 | ORAL_TABLET | ORAL | Status: DC | PRN

## 2014-11-14 MED ORDER — SUCCINYLCHOLINE CHLORIDE 20 MG/ML IJ SOLN
INTRAMUSCULAR | Status: DC | PRN
  Administered 2014-11-14: 100 mg via INTRAVENOUS

## 2014-11-14 MED ORDER — PHENYLEPHRINE 40 MCG/ML (10ML) SYRINGE FOR IV PUSH (FOR BLOOD PRESSURE SUPPORT)
PREFILLED_SYRINGE | INTRAVENOUS | Status: AC
Start: 2014-11-14 — End: 2014-11-14
  Filled 2014-11-14: qty 10

## 2014-11-14 MED ORDER — ONDANSETRON HCL 4 MG/2ML IJ SOLN: 4.0000 mg | INTRAMUSCULAR | Status: DC | PRN

## 2014-11-14 MED ORDER — INSULIN ASPART 100 UNIT/ML ~~LOC~~ SOLN
0.0000 [IU] | Freq: Every day | SUBCUTANEOUS | Status: DC
  Administered 2014-11-14: 3 [IU] via SUBCUTANEOUS

## 2014-11-14 MED ORDER — SODIUM CHLORIDE 0.9 % IR SOLN
Status: DC | PRN
  Administered 2014-11-14: 12:00:00

## 2014-11-14 MED ORDER — ALBUMIN HUMAN 5 % IV SOLN
INTRAVENOUS | Status: DC | PRN
  Administered 2014-11-14 (×2): via INTRAVENOUS

## 2014-11-14 MED ORDER — PANTOPRAZOLE SODIUM 40 MG PO TBEC
40.0000 mg | DELAYED_RELEASE_TABLET | Freq: Two times a day (BID) | ORAL | Status: DC
  Administered 2014-11-14 – 2014-11-15 (×2): 40 mg via ORAL
  Filled 2014-11-14 (×2): qty 1

## 2014-11-14 MED ORDER — HYDROMORPHONE HCL 1 MG/ML IJ SOLN
INTRAMUSCULAR | Status: AC
  Filled 2014-11-14: qty 1

## 2014-11-14 MED ORDER — CELECOXIB 400 MG PO CAPS
400.0000 mg | ORAL_CAPSULE | Freq: Every morning | ORAL | Status: DC
  Administered 2014-11-15: 400 mg via ORAL
  Filled 2014-11-14: qty 1

## 2014-11-14 MED ORDER — TOPIRAMATE 25 MG PO TABS
25.0000 mg | ORAL_TABLET | Freq: Two times a day (BID) | ORAL | Status: DC
  Filled 2014-11-14: qty 2

## 2014-11-14 MED ORDER — SODIUM CHLORIDE 0.9 % IJ SOLN
3.0000 mL | Freq: Two times a day (BID) | INTRAMUSCULAR | Status: DC
  Administered 2014-11-14: 3 mL via INTRAVENOUS

## 2014-11-14 MED ORDER — BUPIVACAINE HCL (PF) 0.25 % IJ SOLN
INTRAMUSCULAR | Status: DC | PRN
  Administered 2014-11-14: 20 mL

## 2014-11-14 MED ORDER — ASPIRIN 81 MG PO TABS
81.0000 mg | ORAL_TABLET | Freq: Every day | ORAL | Status: DC
  Filled 2014-11-14: qty 1

## 2014-11-14 MED ORDER — METOPROLOL TARTRATE 50 MG PO TABS
50.0000 mg | ORAL_TABLET | Freq: Two times a day (BID) | ORAL | Status: DC
  Administered 2014-11-14 – 2014-11-15 (×2): 50 mg via ORAL
  Filled 2014-11-14 (×3): qty 1

## 2014-11-14 MED ORDER — ONDANSETRON HCL 4 MG/2ML IJ SOLN: 4.0000 mg | Freq: Once | INTRAMUSCULAR | Status: DC | PRN

## 2014-11-14 MED ORDER — TERAZOSIN HCL 5 MG PO CAPS
5.0000 mg | ORAL_CAPSULE | Freq: Every day | ORAL | Status: DC
  Administered 2014-11-14: 5 mg via ORAL
  Filled 2014-11-14 (×2): qty 1

## 2014-11-14 MED ORDER — SODIUM CHLORIDE 0.9 % IJ SOLN
INTRAMUSCULAR | Status: AC
  Filled 2014-11-14: qty 10

## 2014-11-14 MED ORDER — SENNA 8.6 MG PO TABS
1.0000 | ORAL_TABLET | Freq: Two times a day (BID) | ORAL | Status: DC
  Administered 2014-11-14 – 2014-11-15 (×2): 8.6 mg via ORAL
  Filled 2014-11-14 (×3): qty 1

## 2014-11-14 MED ORDER — INSULIN ASPART 100 UNIT/ML ~~LOC~~ SOLN
0.0000 [IU] | Freq: Three times a day (TID) | SUBCUTANEOUS | Status: DC
  Administered 2014-11-15: 2 [IU] via SUBCUTANEOUS

## 2014-11-14 MED ORDER — ASPIRIN EC 81 MG PO TBEC
81.0000 mg | DELAYED_RELEASE_TABLET | Freq: Every day | ORAL | Status: DC
  Administered 2014-11-14 – 2014-11-15 (×2): 81 mg via ORAL
  Filled 2014-11-14 (×2): qty 1

## 2014-11-14 MED ORDER — LACTATED RINGERS IV SOLN
INTRAVENOUS | Status: DC
  Administered 2014-11-14: 09:00:00 via INTRAVENOUS

## 2014-11-14 MED ORDER — 0.9 % SODIUM CHLORIDE (POUR BTL) OPTIME
TOPICAL | Status: DC | PRN
  Administered 2014-11-14: 1000 mL

## 2014-11-14 SURGICAL SUPPLY — 67 items
BAG DECANTER FOR FLEXI CONT (MISCELLANEOUS) ×2 IMPLANT
BENZOIN TINCTURE PRP APPL 2/3 (GAUZE/BANDAGES/DRESSINGS) ×2 IMPLANT
BLADE CLIPPER SURG (BLADE) IMPLANT
BRUSH SCRUB EZ PLAIN DRY (MISCELLANEOUS) ×2 IMPLANT
BUR MATCHSTICK NEURO 3.0 LAGG (BURR) ×2 IMPLANT
CAGE COROENT LRG 8X9X28M SPINE (Cage) ×4 IMPLANT
CLIP NEUROVISION LG (CLIP) ×2 IMPLANT
CONT SPEC 4OZ CLIKSEAL STRL BL (MISCELLANEOUS) ×4 IMPLANT
COVER BACK TABLE 24X17X13 BIG (DRAPES) IMPLANT
COVER BACK TABLE 60X90IN (DRAPES) ×2 IMPLANT
DRAPE C-ARM 42X72 X-RAY (DRAPES) ×2 IMPLANT
DRAPE C-ARMOR (DRAPES) ×2 IMPLANT
DRAPE LAPAROTOMY 100X72X124 (DRAPES) ×2 IMPLANT
DRAPE SURG 17X23 STRL (DRAPES) ×8 IMPLANT
DRSG OPSITE POSTOP 4X6 (GAUZE/BANDAGES/DRESSINGS) ×2 IMPLANT
DURAPREP 26ML APPLICATOR (WOUND CARE) ×2 IMPLANT
ELECT BLADE 4.0 EZ CLEAN MEGAD (MISCELLANEOUS)
ELECT REM PT RETURN 9FT ADLT (ELECTROSURGICAL) ×2
ELECTRODE BLDE 4.0 EZ CLN MEGD (MISCELLANEOUS) IMPLANT
ELECTRODE REM PT RTRN 9FT ADLT (ELECTROSURGICAL) ×1 IMPLANT
EVACUATOR 1/8 PVC DRAIN (DRAIN) IMPLANT
GAUZE SPONGE 4X4 12PLY STRL (GAUZE/BANDAGES/DRESSINGS) ×2 IMPLANT
GAUZE SPONGE 4X4 16PLY XRAY LF (GAUZE/BANDAGES/DRESSINGS) IMPLANT
GLOVE BIO SURGEON STRL SZ 6.5 (GLOVE) ×2 IMPLANT
GLOVE BIOGEL PI IND STRL 7.5 (GLOVE) ×3 IMPLANT
GLOVE BIOGEL PI INDICATOR 7.5 (GLOVE) ×3
GLOVE ECLIPSE 7.0 STRL STRAW (GLOVE) ×4 IMPLANT
GLOVE ECLIPSE 9.0 STRL (GLOVE) ×2 IMPLANT
GLOVE EXAM NITRILE LRG STRL (GLOVE) IMPLANT
GLOVE EXAM NITRILE MD LF STRL (GLOVE) IMPLANT
GLOVE EXAM NITRILE XL STR (GLOVE) IMPLANT
GLOVE EXAM NITRILE XS STR PU (GLOVE) IMPLANT
GLOVE SURG SS PI 7.0 STRL IVOR (GLOVE) ×6 IMPLANT
GOWN STRL REUS W/ TWL LRG LVL3 (GOWN DISPOSABLE) ×3 IMPLANT
GOWN STRL REUS W/ TWL XL LVL3 (GOWN DISPOSABLE) ×1 IMPLANT
GOWN STRL REUS W/TWL 2XL LVL3 (GOWN DISPOSABLE) IMPLANT
GOWN STRL REUS W/TWL LRG LVL3 (GOWN DISPOSABLE) ×3
GOWN STRL REUS W/TWL XL LVL3 (GOWN DISPOSABLE) ×1
KIT BASIN OR (CUSTOM PROCEDURE TRAY) ×2 IMPLANT
KIT NEEDLE NVM5 EMG ELECT (KITS) ×1 IMPLANT
KIT NEEDLE NVM5 EMG ELECTRODE (KITS) ×1
KIT ROOM TURNOVER OR (KITS) ×2 IMPLANT
LIQUID BAND (GAUZE/BANDAGES/DRESSINGS) ×2 IMPLANT
MILL MEDIUM DISP (BLADE) ×2 IMPLANT
NEEDLE HYPO 22GX1.5 SAFETY (NEEDLE) ×2 IMPLANT
NS IRRIG 1000ML POUR BTL (IV SOLUTION) ×2 IMPLANT
PACK LAMINECTOMY NEURO (CUSTOM PROCEDURE TRAY) ×2 IMPLANT
ROD 5.5X40MM (Rod) ×2 IMPLANT
ROD PREBENT 45MM LUMBAR (Rod) ×2 IMPLANT
SCREW LOCK (Screw) ×4 IMPLANT
SCREW LOCK FXNS SPNE MAS PL (Screw) ×4 IMPLANT
SCREW SHANK 5.0X30MM (Screw) ×4 IMPLANT
SCREW TULIP 5.5 (Screw) ×4 IMPLANT
SPONGE LAP 4X18 X RAY DECT (DISPOSABLE) IMPLANT
SPONGE SURGIFOAM ABS GEL 100 (HEMOSTASIS) ×2 IMPLANT
SPONGE SURGIFOAM ABS GEL SZ50 (HEMOSTASIS) IMPLANT
STRIP CLOSURE SKIN 1/2X4 (GAUZE/BANDAGES/DRESSINGS) ×2 IMPLANT
SUT VIC AB 0 CT1 18XCR BRD8 (SUTURE) ×2 IMPLANT
SUT VIC AB 0 CT1 8-18 (SUTURE) ×2
SUT VIC AB 2-0 CT1 18 (SUTURE) ×2 IMPLANT
SUT VIC AB 3-0 SH 8-18 (SUTURE) ×2 IMPLANT
SYR 20ML ECCENTRIC (SYRINGE) ×2 IMPLANT
TOWEL OR 17X24 6PK STRL BLUE (TOWEL DISPOSABLE) ×2 IMPLANT
TOWEL OR 17X26 10 PK STRL BLUE (TOWEL DISPOSABLE) ×2 IMPLANT
TRAY FOLEY CATH 14FRSI W/METER (CATHETERS) IMPLANT
TRAY FOLEY CATH 16FRSI W/METER (SET/KITS/TRAYS/PACK) ×2 IMPLANT
WATER STERILE IRR 1000ML POUR (IV SOLUTION) ×2 IMPLANT

## 2014-11-14 NOTE — Anesthesia Preprocedure Evaluation (Signed)
Anesthesia Evaluation  Patient identified by MRN, date of birth, ID band Patient awake    Reviewed: Allergy & Precautions, H&P , NPO status , Patient's Chart, lab work & pertinent test results  Airway        Dental   Pulmonary asthma , sleep apnea ,          Cardiovascular hypertension, + Peripheral Vascular Disease and + DOE     Neuro/Psych  Headaches,    GI/Hepatic GERD-  ,  Endo/Other  diabetes, Type 2  Renal/GU      Musculoskeletal  (+) Arthritis -,   Abdominal   Peds  Hematology   Anesthesia Other Findings   Reproductive/Obstetrics                             Anesthesia Physical Anesthesia Plan  ASA: III  Anesthesia Plan: General   Post-op Pain Management:    Induction: Intravenous  Airway Management Planned: Oral ETT  Additional Equipment:   Intra-op Plan:   Post-operative Plan: Extubation in OR  Informed Consent: I have reviewed the patients History and Physical, chart, labs and discussed the procedure including the risks, benefits and alternatives for the proposed anesthesia with the patient or authorized representative who has indicated his/her understanding and acceptance.     Plan Discussed with: CRNA, Anesthesiologist and Surgeon  Anesthesia Plan Comments:         Anesthesia Quick Evaluation

## 2014-11-14 NOTE — Op Note (Signed)
Date of procedure: 11/14/2014  Date of dictation: Same  Service: Neurosurgery  Preoperative diagnosis: L2-3 calcified central herniated nucleus pulposus with severe stenosis and neurogenic claudication  Postoperative diagnosis: Same  Procedure Name: L2-3 decompressive laminectomy with bilateral L2 and L3 decompressive foraminotomies, more that would be required for simple interbody fusion alone.  L2-3 posterior lumbar interbody fusion utilizing interbody peek cages and local autograft  L2-3 posterior lateral arthrodesis utilizing pedicle screw fixation and local autograft  Surgeon:Bula Cavalieri A.Mariah Harn, M.D.  Asst. Surgeon: Kathyrn Sheriff  Anesthesia: General  Indication: 63 year old male with severe back and bilateral lower extremity symptoms consistent with neurogenic claudication. Workup demonstrates evidence of marked disc degeneration with vacuum disc phenomenon at L2-3 with a large broad-based calcified disc herniation causing moderately severe stenosis at L2-3. Patient has failed conservative management presents now for decompression infusion in hopes of improving his symptoms.  Operative note: After induction anesthesia, patient positioned prone onto Wilson frame. Lumbar region prepped and draped sterilely. Incision made overlying L2-3. Dissection performed bilaterally. Retractor placed. Fluoroscopy used. Levels confirmed. Using intraoperative neural monitoring throughout cortical pedicle screws were placed into L2 by first drilling a pilot hole into the pars interarticularis at the inferior medial aspect of the pedicle of L3 bilaterally. The pilot hole was then extended in a cephalad and lateral trajectory. Monitoring was within limits throughout. Drill was withdrawn. Hole was probed and found to be solidly within the bone. 6 hole was then tapped with a screw tap and then a 5.0 x 30 mm cortical screw was placed bilaterally at L2. Decompressive laminectomies and performed using Leksell rongeurs  Kerrison rongeurs the high-speed drill to remove the entire lamina of L2 the inferior facets of L2 bilaterally superior facets of L3 bilaterally and the superior aspect the L3 lamina. Ligament flavum was elevated and resected these will fashion using Kerrison rongeurs. Underlying thecal sac and L3 nerve roots and L2 nerve roots were notified bilaterally. Decompressive foraminotomies were completed on the course the exiting L2 and L3 nerve roots much more extensively than would be necessary for just interbody fusion alone. Epidermides pus was quite related and cut. Starting first in the left side thecal sac and nerve root was dissected free from the underlying calcified disc herniation. The disc herniation was then removed using Kerrison rongeurs pituitary rongeurs and osteophyte removers. Disc space was entered an aggressive discectomy was performed. Procedure then repeated on the contralateral side. The discectomy extended across midline and all meds of the calcified disc protrusion appeared to have been resected. Disc spaces and distracted up to 8 mm with an 8 mm distractor less than patient's left side thecal sac and nerve roots were protected on the right side. Disc spaces and reamed and soft tissue was removed. Disc spaces further scraped using various curettes. 8 mm x 27 mm x 8 nuvasive interbody peek cage was impacted into place and rotated in position. The cage and then packed with morselized autograft obtained during the laminectomy. Distractor was removed from the contralateral side. Thecal sac and nerve extension of the contralateral side. Disc space was again reamed and scraped. Soft tissues removed and interspace. Disc space packed with morselized autograft. Second cage impacted in position and rotated into final position. Cortical pedicle screws were not feasible at L3 secondary to pedicle anatomy. Traditional pedicle screws were placed bilaterally at L3 by first drilling pilot holes then tapping the  pilot holes under fluoroscopic guidance. 5.5 x 45 mm screws were placed bilaterally at L3. Transverse processes and lateral facets  were decorticated using high-speed drill. Morselized autograft packed posterior laterally for later fusion. Short segment titanium rod placed over the screw heads at L3 and L2. Locking caps and placed over the screws were locking caps and engaged and given a final tightening. Final images revealed good position the bone graft and hardware at proper upper level with normal lamina spine. Wounds an area one final time. Hemostasis was assured with bipolar chart was and close in layers with Vicryl sutures. Vancomycin powder was left in the deep wound space. Steri-Strips sterile dressing were applied. No apparent complications. Patient tolerated the procedure well and he returns to the recovery room postop.

## 2014-11-14 NOTE — Brief Op Note (Signed)
11/14/2014  2:11 PM  PATIENT:  Garrett [name]  63 y.o. male  PRE-OPERATIVE DIAGNOSIS:  SPONDYLOSIS  POST-OPERATIVE DIAGNOSIS:  SPONDYLOSIS  PROCEDURE:  Procedure(s): Lumbar two-three Maximum Access Surgery Posterior Lumbar Interbody Fusion (N/A)  SURGEON:  Surgeon(s) and Role:    * [name], MD - Primary    * Consuella Lose, MD - Assisting  PHYSICIAN ASSISTANT:   ASSISTANTS:    ANESTHESIA:   general  EBL:  Total I/O In: 2500 [I.V.:2000; IV Piggyback:500] Out: 1125 [Urine:125; Blood:1000]  BLOOD ADMINISTERED:none  DRAINS: none   LOCAL MEDICATIONS USED:  MARCAINE     SPECIMEN:  No Specimen  DISPOSITION OF SPECIMEN:  N/A  COUNTS:  YES  TOURNIQUET:  * No tourniquets in log *  DICTATION: .Dragon Dictation  PLAN OF CARE: Admit to inpatient   PATIENT DISPOSITION:  PACU - hemodynamically stable.   Delay start of Pharmacological VTE agent (>24hrs) due to surgical blood loss or risk of bleeding: yes

## 2014-11-14 NOTE — Plan of Care (Signed)
Problem: Consults Goal: Spinal Surgery Patient Education See Patient Education Module for education specifics. Outcome: Completed/Met Date Met:  11/14/14     
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
## 2014-11-14 NOTE — Anesthesia Postprocedure Evaluation (Signed)
  Anesthesia Post-op Note  Patient: Garrett [name]  Procedure(s) Performed: Procedure(s): Lumbar two-three Maximum Access Surgery Posterior Lumbar Interbody Fusion (N/A)  Patient Location: PACU  Anesthesia Type:General  Level of Consciousness: awake, oriented, sedated and patient cooperative  Airway and Oxygen Therapy: Patient Spontanous Breathing  Post-op Pain: mild  Post-op Assessment: Post-op Vital signs reviewed, Patient's Cardiovascular Status Stable, Respiratory Function Stable, Patent Airway, No signs of Nausea or vomiting and Pain level controlled  Post-op Vital Signs: stable  Last Vitals:  Filed Vitals:   11/14/14 1553  BP: 133/74  Pulse: 70  Temp: 36.4 C  Resp: 20    Complications: No apparent anesthesia complications

## 2014-11-14 NOTE — Transfer of Care (Signed)
Immediate Anesthesia Transfer of Care Note  Patient: Garrett [name]  Procedure(s) Performed: Procedure(s): Lumbar two-three Maximum Access Surgery Posterior Lumbar Interbody Fusion (N/A)  Patient Location: PACU  Anesthesia Type:General  Level of Consciousness: awake, alert , oriented and sedated  Airway & Oxygen Therapy: Patient Spontanous Breathing and Patient connected to nasal cannula oxygen  Post-op Assessment: Report given to PACU RN, Post -op Vital signs reviewed and stable and Patient moving all extremities  Post vital signs: Reviewed and stable  Complications: No apparent anesthesia complications

## 2014-11-14 NOTE — Plan of Care (Signed)
Problem: Consults Goal: Diagnosis - Spinal Surgery Outcome: Completed/Met Date Met:  11/14/14 [place]/Lumbar Spine Fusion

## 2014-11-14 NOTE — Evaluation (Signed)
Occupational Therapy Evaluation Patient Details Name: Garrett [name] MRN: [id number] DOB: [birth year] Today's Date: 11/14/2014    History of Present Illness 63 y.o. s/p Lumbar two-three Maximum Access Surgery Posterior Lumbar Interbody Fusion.   Clinical Impression   Pt s/p above. Pt independent with ADLs, PTA. Feel pt will benefit from acute OT to increase independence with BADLs prior to d/c. Plan to practice with AE tomorrow and perform ADLs as well as shower transfer and reinforce precautions.     Follow Up Recommendations  No OT follow up;Supervision/Assistance - 24 hour    Equipment Recommendations  Other (comment) (AE)    Recommendations for Other Services       Precautions / Restrictions Precautions Precautions: Back;Fall Precaution Booklet Issued: Yes (comment) Precaution Comments: educated on precautions Required Braces or Orthoses: Spinal Brace Spinal Brace: Lumbar corset;Applied in sitting position Restrictions Weight Bearing Restrictions: No      Mobility Bed Mobility Overal bed mobility: Needs Assistance Bed Mobility: Rolling;Sidelying to Sit Rolling: Min guard Sidelying to sit: Min guard       General bed mobility comments: Discussed how to return to bed. Cues for log roll technique.   Transfers Overall transfer level: Needs assistance Equipment used: Rolling walker (2 wheeled);None Transfers: Sit to/from Stand Sit to Stand: Mod assist;Min assist         General transfer comment: Mod A without walker. Min A with walker. Pt stood and tried to shift weight without walker and sat on bed suddenly.    Balance                                            ADL Overall ADL's : Needs assistance/impaired                 Upper Body Dressing : Moderate assistance;Sitting   Lower Body Dressing: Minimal assistance;Sit to/from stand;With adaptive equipment   Toilet Transfer: Minimal assistance;Ambulation;RW (bed)   Toileting-  Clothing Manipulation and Hygiene: Sitting/lateral lean;Minimal assistance (difficult to simulate on bed leaning)       Functional mobility during ADLs: Minimal assistance;Rolling walker General ADL Comments: Educated on use of cup for oral care and placement of grooming items to avoid breaking precautions. Educated on positioning of pillows. Educated on AE for LB ADLs as well as cost and where to purchase. Educated on back brace. Pt ambulated with walker.  Educated on what pt could use for toilet aide.      Vision                     Perception     Praxis      Pertinent Vitals/Pain Pain Assessment: 0-10 Pain Score: 6  Pain Location: back Pain Intervention(s): Monitored during session     Hand Dominance Right   Extremity/Trunk Assessment Upper Extremity Assessment Upper Extremity Assessment: Overall WFL for tasks assessed   Lower Extremity Assessment Lower Extremity Assessment: Defer to PT evaluation (numbness)       Communication Communication Communication: HOH   Cognition Arousal/Alertness: Awake/alert Behavior During Therapy: WFL for tasks assessed/performed Overall Cognitive Status: Within Functional Limits for tasks assessed                     General Comments       Exercises       Shoulder Instructions      Home Living Family/patient  expects to be discharged to:: Private residence Living Arrangements: Spouse/significant other Available Help at Discharge: Family;Available 24 hours/day (going back to work on dec 1) Type of Home: House Home Access: Stairs to enter [employer] of Steps: 5 Entrance Stairs-Rails: Right;Left;Can reach both Home Layout: One level     Bathroom Shower/Tub: Occupational psychologist: Handicapped height     Home Equipment: Environmental consultant - 2 wheels;Cane - single point;Grab bars - tub/shower;Shower seat - built in;Bedside commode          Prior Functioning/Environment Level of Independence:  Independent with assistive device(s)        Comments: used cane occassionally    OT Diagnosis: Generalized weakness;Acute pain   OT Problem List: Decreased strength;Decreased range of motion;Impaired balance (sitting and/or standing);Decreased knowledge of use of DME or AE;Decreased knowledge of precautions;Pain   OT Treatment/Interventions: Self-care/ADL training;DME and/or AE instruction;Therapeutic activities;Patient/family education;Balance training    OT Goals(Current goals can be found in the care plan section) Acute Rehab OT Goals Patient Stated Goal: not stated OT Goal Formulation: With patient Time For Goal Achievement: 11/21/14 Potential to Achieve Goals: Good ADL Goals Pt Will Perform Grooming: with modified independence;standing Pt Will Perform Lower Body Dressing: with adaptive equipment;sit to/from stand;with modified independence Pt Will Transfer to Toilet: ambulating;with modified independence;grab bars (elevated toilet) Pt Will Perform Toileting - Clothing Manipulation and hygiene: with modified independence;sit to/from stand;sitting/lateral leans Pt Will Perform Tub/Shower Transfer: Shower transfer;with supervision;ambulating;shower seat;rolling walker Additional ADL Goal #1: Pt will independently verbalize and demonstrate 3/3 back precautions.   OT Frequency: Min 2X/week   Barriers to D/C:            Co-evaluation              End of Session Equipment Utilized During Treatment: Gait belt;Rolling walker;Back brace Nurse Communication: Mobility status  Activity Tolerance: Patient tolerated treatment well Patient left: in bed;with call bell/phone within reach;with family/visitor present   Time: 1093-2355 OT Time Calculation (min): 24 min Charges:  OT General Charges $OT Visit: 1 Procedure OT Evaluation $Initial OT Evaluation Tier I: 1 Procedure OT Treatments $Self Care/Home Management : 8-22 mins G-CodesBenito Mccreedy  OTR/L [phone] 11/14/2014, 6:20 PM

## 2014-11-14 NOTE — Anesthesia Procedure Notes (Signed)
Procedure Name: Intubation Date/Time: 11/14/2014 10:52 AM Performed by: Scheryl Darter Pre-anesthesia Checklist: Patient identified, Emergency Drugs available, Suction available, Patient being monitored and Timeout performed Patient Re-evaluated:Patient Re-evaluated prior to inductionOxygen Delivery Method: Circle system utilized Preoxygenation: Pre-oxygenation with 100% oxygen Intubation Type: IV induction Ventilation: Mask ventilation without difficulty Laryngoscope Size: Mac and 4 Grade View: Grade I Tube type: Oral Tube size: 8.0 mm Number of attempts: 1 Airway Equipment and Method: Stylet Placement Confirmation: ETT inserted through vocal cords under direct vision,  positive ETCO2 and breath sounds checked- equal and bilateral Secured at: 23 cm Tube secured with: Tape Dental Injury: Teeth and Oropharynx as per pre-operative assessment

## 2014-11-15 ENCOUNTER — Encounter (HOSPITAL_COMMUNITY): Payer: Self-pay | Admitting: Neurosurgery

## 2014-11-15 LAB — POCT I-STAT 4, (NA,K, GLUC, HGB,HCT)
GLUCOSE: 135 mg/dL — AB (ref 70–99)
HCT: 31 % — ABNORMAL LOW (ref 39.0–52.0)
Hemoglobin: 10.5 g/dL — ABNORMAL LOW (ref 13.0–17.0)
Potassium: 4 mEq/L (ref 3.7–5.3)
Sodium: 144 mEq/L (ref 137–147)

## 2014-11-15 LAB — GLUCOSE, CAPILLARY
GLUCOSE-CAPILLARY: 123 mg/dL — AB (ref 70–99)
Glucose-Capillary: 125 mg/dL — ABNORMAL HIGH (ref 70–99)

## 2014-11-15 MED ORDER — DIAZEPAM 5 MG PO TABS: 5.0000 mg | ORAL_TABLET | Freq: Four times a day (QID) | ORAL | Status: DC | PRN

## 2014-11-15 MED ORDER — OXYCODONE-ACETAMINOPHEN 5-325 MG PO TABS: 1.0000 | ORAL_TABLET | ORAL | Status: DC | PRN

## 2014-11-15 NOTE — Evaluation (Signed)
Physical Therapy Evaluation and Discharge Patient Details Name: Garrett [name] MRN: [id number] DOB: 03-16-1951 Today's Date: 11/15/2014   History of Present Illness  63 y.o. s/p Lumbar two-three Maximum Access Surgery Posterior Lumbar Interbody Fusion.  Clinical Impression  Patient evaluated by Physical Therapy with no further acute PT needs identified. All education has been completed and the patient has no further questions. Ambulates slowly but safely with a rolling walker for stability up to 455 feet. Safely completed stair training this AM and reviewed back precautions and safe positioning. See below for any follow-up Physial Therapy or equipment needs. PT is signing off. Thank you for this referral.     Follow Up Recommendations No PT follow up    Equipment Recommendations  None recommended by PT    Recommendations for Other Services       Precautions / Restrictions Precautions Precautions: Back;Fall Precaution Booklet Issued: Yes (comment) Precaution Comments: educated on precautions Required Braces or Orthoses: Spinal Brace Spinal Brace: Lumbar corset;Applied in sitting position Restrictions Weight Bearing Restrictions: No      Mobility  Bed Mobility Overal bed mobility: Needs Assistance Bed Mobility: Rolling;Sidelying to Sit;Sit to Sidelying Rolling: Supervision Sidelying to sit: Min guard;Supervision     Sit to sidelying: Supervision General bed mobility comments: Supervision for safety. Reviewed log roll technique. Cues to maintain back precautions with sidelying>sit. Did not require physical assist.  Transfers Overall transfer level: Needs assistance Equipment used: Rolling walker (2 wheeled) Transfers: Sit to/from Stand Sit to Stand: Min guard         General transfer comment: Min guard for safety. VC for hand placement. Performed from lowest bed setting and recliner. Good control and stability once holding RW.  Ambulation/Gait Ambulation/Gait  assistance: Supervision Ambulation Distance (Feet): 455 Feet Assistive device: Rolling walker (2 wheeled) Gait Pattern/deviations: Step-through pattern;Decreased stride length;Wide base of support Gait velocity: decreased   General Gait Details: Educated on safe DME use with a rolling walker. VC for keeping RW on ground while ambulating. No loss of balance noted. Pt states he feels much more stable while holding onto RW. Slow and guarded but did not require physical assist during bout.  Stairs Stairs: Yes Stairs assistance: Supervision Stair Management: One rail Right;Step to pattern;Forwards;Sideways Number of Stairs: 13 General stair comments: VC for sequencing, trial of forwards and sideways approach. Pt safer with sideways approach and agrees. good stability and foot placement on steps. No loss of balance and good control with descent. Maintains back precautions.  Wheelchair Mobility    Modified Rankin (Stroke Patients Only)       Balance Overall balance assessment: Needs assistance Sitting-balance support: No upper extremity supported;Feet supported Sitting balance-Leahy Scale: Good     Standing balance support: No upper extremity supported Standing balance-Leahy Scale: Fair                               Pertinent Vitals/Pain Pain Assessment: 0-10 Pain Score: 6  Pain Location: back Pain Descriptors / Indicators: Operative site guarding Pain Intervention(s): Monitored during session;Repositioned    Home Living Family/patient expects to be discharged to:: Private residence Living Arrangements: Spouse/significant other Available Help at Discharge: Family;Available 24 hours/day (going back to work on dec 1) Type of Home: House Home Access: Stairs to enter Entrance Stairs-Rails: Right;Left;Can reach both Technical brewer of Steps: 5 Home Layout: One level Home Equipment: Environmental consultant - 2 wheels;Cane - single point;Grab bars - tub/shower;Shower seat - built  in;Bedside  commode      Prior Function Level of Independence: Independent with assistive device(s)         Comments: used cane occassionally     Hand Dominance   Dominant Hand: Right    Extremity/Trunk Assessment   Upper Extremity Assessment: Defer to OT evaluation           Lower Extremity Assessment: RLE deficits/detail RLE Deficits / Details: numbness medial thigh, and in a band pattern around middle LE. MMT: 3+/5 knee extension 4/5 knee flexion, 4/5 dorsiflexion and great toe extension.       Communication   Communication: HOH  Cognition Arousal/Alertness: Awake/alert Behavior During Therapy: WFL for tasks assessed/performed Overall Cognitive Status: Within Functional Limits for tasks assessed                      General Comments General comments (skin integrity, edema, etc.): Reviewed back precautions, positioning, safety with mobility and answered all questions.    Exercises        Assessment/Plan    PT Assessment Patent does not need any further PT services  PT Diagnosis Difficulty walking;Abnormality of gait;Acute pain;Generalized weakness   PT Problem List    PT Treatment Interventions     PT Goals (Current goals can be found in the Care Plan section) Acute Rehab PT Goals Patient Stated Goal: Go home PT Goal Formulation: All assessment and education complete, DC therapy    Frequency     Barriers to discharge        Co-evaluation               End of Session Equipment Utilized During Treatment: Gait belt;Back brace Activity Tolerance: Patient tolerated treatment well Patient left: in bed;with call bell/phone within reach Nurse Communication: Mobility status         Time: 7915-0569 PT Time Calculation (min) (ACUTE ONLY): 26 min   Charges:   PT Evaluation $Initial PT Evaluation Tier I: 1 Procedure PT Treatments $Gait Training: 8-22 mins   PT G CodesEllouise Newer 11/15/2014, 11:32 AM  Elayne Snare, [place]

## 2014-11-15 NOTE — Discharge Summary (Signed)
Physician Discharge Summary  Patient ID: Garrett [name] MRN: [id number] DOB/AGE: 1951-03-06 63 y.o.  Admit date: 11/14/2014 Discharge date: 11/15/2014  Admission Diagnoses:  Discharge Diagnoses:  Active Problems:   Lumbar stenosis with neurogenic claudication   Discharged Condition: good  Hospital Course: The patient was admitted to the hospital where he underwent an uncomplicated H4-7 decompression infusion. Postoperative use doing well. Preoperative back and lower extremity pain and weakness improving. Wound healing well. No tissues. Ambulating well. Ready for discharge home.  Consults:   Significant Diagnostic Studies:   Treatments:   Discharge Exam: Blood pressure 141/75, pulse 71, temperature 97.8 F (36.6 C), temperature source Oral, resp. rate 20, height 5\' 9"  (1.753 m), weight 91.173 kg (201 lb), SpO2 99 %. Awake and alert. Oriented and appropriate. Motor and sensory function intact. Wound clean and dry. Chest and abdomen benign.  Disposition: 01-Home or Self Care     Medication List    TAKE these medications        allopurinol 100 MG tablet  Commonly known as:  ZYLOPRIM  Take 1 tablet (100 mg total) by mouth daily.     aspirin 81 MG tablet  Take 81 mg by mouth daily.     celecoxib 200 MG capsule  Commonly known as:  CELEBREX  Take 400 mg by mouth every morning.     CINNAMON PO  Take 1 capsule by mouth daily.     cyanocobalamin 500 MCG tablet  Take 500 mcg by mouth daily.     cyclobenzaprine 10 MG tablet  Commonly known as:  FLEXERIL  One half tab PO qHS, then increase gradually to one tab TID.     diazepam 5 MG tablet  Commonly known as:  VALIUM  Take 1-2 tablets (5-10 mg total) by mouth every 6 (six) hours as needed for muscle spasms.     EPIPEN 0.3 mg/0.3 mL Soaj injection  Generic drug:  EPINEPHrine  Inject 0.3 mg into the muscle once.     furosemide 20 MG tablet  Commonly known as:  LASIX  TAKE 1 TABLET BY MOUTH ONCE DAILY     HYDROcodone-acetaminophen 5-325 MG per tablet  Commonly known as:  NORCO/VICODIN  Take 1 tablet by mouth 2 (two) times daily as needed for moderate pain.     memantine 10 MG tablet  Commonly known as:  NAMENDA  Take 20 mg by mouth daily.     metoprolol 100 MG tablet  Commonly known as:  LOPRESSOR  Take 0.5 tablets (50 mg total) by mouth 2 (two) times daily.     ONE-A-DAY WEIGHT SMART ADVANCE PO  Take 1 tablet by mouth daily.     oxyCODONE-acetaminophen 5-325 MG per tablet  Commonly known as:  PERCOCET/ROXICET  Take 1-2 tablets by mouth every 4 (four) hours as needed for moderate pain.     pantoprazole 40 MG tablet  Commonly known as:  PROTONIX  Take 40 mg by mouth 2 (two) times daily.     Potassium Gluconate 550 MG Tabs  Take 550 mg by mouth daily.     pravastatin 20 MG tablet  Commonly known as:  PRAVACHOL  TAKE 1 TABLET BY MOUTH DAILY.     ramipril 10 MG capsule  Commonly known as:  ALTACE  TAKE 1 CAPSULE BY MOUTH ONCE DAILY     terazosin 5 MG capsule  Commonly known as:  HYTRIN  TAKE 1 CAPSULE BY MOUTH AT BEDTIME     topiramate 25 MG tablet  Commonly known  as:  TOPAMAX  Take 25-50 mg by mouth 2 (two) times daily. Take 25 mg every morning and take 50 mg every evening     verapamil 240 MG CR tablet  Commonly known as:  CALAN-SR  Take 240 mg by mouth every morning.     VICTOZA 18 MG/3ML Sopn  Generic drug:  Liraglutide  HOLD until further discssion           Follow-up Information    Follow up with [name], MD.   Specialty:  Neurosurgery   Contact information:   [address] [place] [address] [phone]       Signed: Gitel Beste A 11/15/2014, 9:25 AM

## 2014-11-15 NOTE — Progress Notes (Signed)
Occupational Therapy Treatment Patient Details Name: Garrett [name] MRN: [id number] DOB: 08-07-1951 Today's Date: 11/15/2014    History of present illness 63 y.o. s/p Lumbar two-three Maximum Access Surgery Posterior Lumbar Interbody Fusion.   OT comments  Education provided. Pt verbalized understanding. OT signing off.  Follow Up Recommendations  No OT follow up;Supervision/Assistance - 24 hour    Equipment Recommendations  Other (comment) (AE)    Recommendations for Other Services      Precautions / Restrictions Precautions Precautions: Back;Fall Precaution Comments: educated on precautions; pt able to state 2/3  Required Braces or Orthoses: Spinal Brace Spinal Brace: Lumbar corset;Applied in sitting position Restrictions Weight Bearing Restrictions: No       Mobility Bed Mobility Overal bed mobility: Needs Assistance Bed Mobility: Rolling;Sidelying to Sit;Sit to Sidelying Rolling: Supervision Sidelying to sit: Supervision     Sit to sidelying: Modified independent (Device/Increase time) General bed mobility comments: cues for technique.  Transfers Overall transfer level: Needs assistance Equipment used: Rolling walker (2 wheeled);None Transfers: Sit to/from Stand Sit to Stand: Min guard         General transfer comment: cues for technique.    Balance                                   ADL Overall ADL's : Needs assistance/impaired     Grooming: Oral care;Brushing hair;Set up;Supervision/safety;Standing (with tactile cues) Grooming Details (indicate cue type and reason): cues for precautions         Upper Body Dressing : Set up;Supervision/safety;Sitting   Lower Body Dressing: Min guard;With adaptive equipment;Sit to/from stand   Toilet Transfer: Min guard;Ambulation;RW (bed/chair)   Toileting- Clothing Manipulation and Hygiene: Sitting/lateral lean;Supervision/safety   Tub/ Shower Transfer: Minimal assistance;Ambulation    Functional mobility during ADLs: Min guard;Rolling walker (told pt to push walker instead of picking it up) General ADL Comments: Practiced with AE for LB ADLs and discussed maintaining precautions in session. Reviewed back brace information. Educated on safety (use of bag on walker, safe shoewear, rugs, recommended spouse being with pt for shower transfer). Practiced simulated shower transfer. Reviewed use of cup for oral care and placement of grooming items to avoid breaking precautions. Discussed where AE could be purchased and cost. Reviewed what pt could use as toilet aide for hygiene if needed (pt simulated leaning-unsure if he could perform hygiene thoroughly without twisting).  Discussed LB dressing technique.      Vision                     Perception     Praxis      Cognition  Awake/Alert Behavior During Therapy: WFL for tasks assessed/performed Overall Cognitive Status: Within Functional Limits for tasks assessed                       Extremity/Trunk Assessment               Exercises     Shoulder Instructions       General Comments      Pertinent Vitals/ Pain       Pain Assessment: 0-10 Pain Score: 5  Pain Location: back Pain Descriptors / Indicators: Aching Pain Intervention(s): Repositioned;Monitored during session;Premedicated before session  Home Living  Prior Functioning/Environment              Frequency Min 2X/week     Progress Toward Goals  OT Goals(current goals can now be found in the care plan section)  Progress towards OT goals: Progressing toward goals  Acute Rehab OT Goals Patient Stated Goal: not stated OT Goal Formulation: With patient Time For Goal Achievement: 11/21/14 Potential to Achieve Goals: Good ADL Goals Pt Will Perform Grooming: with modified independence;standing Pt Will Perform Lower Body Dressing: with adaptive equipment;sit to/from  stand;with modified independence Pt Will Transfer to Toilet: ambulating;with modified independence;grab bars (elevated toilet) Pt Will Perform Toileting - Clothing Manipulation and hygiene: with modified independence;sit to/from stand;sitting/lateral leans Pt Will Perform Tub/Shower Transfer: Shower transfer;with supervision;ambulating;shower seat;rolling walker Additional ADL Goal #1: Pt will independently verbalize and demonstrate 3/3 back precautions.   Plan Discharge plan remains appropriate    Co-evaluation                 End of Session Equipment Utilized During Treatment: Gait belt;Rolling walker;Back brace   Activity Tolerance Patient tolerated treatment well   Patient Left in chair;with call bell/phone within reach   Nurse Communication Mobility status        Time: 6433-2951 OT Time Calculation (min): 31 min  Charges: OT General Charges $OT Visit: 1 Procedure OT Treatments $Self Care/Home Management : 23-37 mins   Benito Mccreedy OTR/L [phone]  11/15/2014, 10:20 AM

## 2014-11-15 NOTE — Discharge Instructions (Signed)

## 2014-11-15 NOTE — Progress Notes (Signed)
Patient alert and oriented, mae's well, voiding adequate amount of urine, swallowing without difficulty, no c/o pain. Patient discharged home with family. Script and discharged instructions given to patient. Patient and family stated understanding of d/c instructions given and has an appointment with MD. Aisha Mabrey Howland RN 

## 2015-02-08 ENCOUNTER — Encounter: Payer: Self-pay | Admitting: Internal Medicine

## 2015-02-08 ENCOUNTER — Ambulatory Visit (INDEPENDENT_AMBULATORY_CARE_PROVIDER_SITE_OTHER): Payer: 59 | Admitting: Internal Medicine

## 2015-02-08 VITALS — BP 134/76 | HR 73 | Temp 98.2°F | Ht 69.0 in | Wt 207.4 lb

## 2015-02-08 DIAGNOSIS — J069 Acute upper respiratory infection, unspecified: Secondary | ICD-10-CM

## 2015-02-08 MED ORDER — AMOXICILLIN 500 MG PO CAPS: 500.0000 mg | ORAL_CAPSULE | Freq: Three times a day (TID) | ORAL | Status: DC

## 2015-02-08 NOTE — Patient Instructions (Addendum)
Plain Mucinex (NOT D) for thick secretions ;force NON dairy fluids .   Nasal cleansing in the shower as discussed with lather of mild shampoo.After 10 seconds wash off lather while  exhaling through nostrils. Make sure that all residual soap is removed to prevent irritation.  Flonase OR Nasacort AQ 1 spray in each nostril twice a day as needed. Use the "crossover" technique into opposite nostril spraying toward opposite ear @ 45 degree angle, not straight up into nostril.  Plain Allegra (NOT D )  160 daily , Loratidine 10 mg , OR Zyrtec 10 mg @ bedtime  as needed for itchy eyes & sneezing.  Unfortunately smokeless tobacco can be associated with leukoplakia which are premalignant intraoral lesions. Also the high sugar content can lead to significant caries. Please consider seeing the Dentist every 6 months.

## 2015-02-08 NOTE — Progress Notes (Signed)
Pre visit review using our clinic review tool, if applicable. No additional management support is needed unless otherwise documented below in the visit note. 

## 2015-02-08 NOTE — Progress Notes (Signed)
   Subjective:    Patient ID: Garrett [name], male    DOB: 1951-06-14, 64 y.o.   MRN: [id number]  HPI  Symptoms began as chills 02/04/15 which have continued. As of 2/17 today he's had headache in the temple areas up to level II-III. It is described as pulsating & pressing.  He describes sinus congestion and nasal obstruction. He's having yellow-green nasal purulence intermittently. He has minor symptoms of fatigue; ear pressure; sneezing; itchy, watery eyes; & postnasal drainage.  He's been using Afrin nasal spray as well as a over-the-counter decongestant. He alsouses Nasonex. Medicines have resulted in a short period of relief  He has not smokedd since early 1990s. He smoked 3 packs a day  for a total of 20 years. He now uses smokeless tobacco.  Review of Systems  He denies frontal sinus or maxillary sinus pain per se. He's having no dental pain or halitosis. There's been no change in sense of smell. Cough is not significant.  He's lost 50 pounds in the last year purposefully.    Objective:   Physical Exam  Pertinent or positive findings include: He is wearing hearing aids bilaterally. He has an upper dental plate. He has isolated dental fractures without significant caries. Nares are boggy. There is accentuation of the thoracic curvature.  General appearance:Adequately nourished; no acute distress or increased work of breathing is present.  No  lymphadenopathy about the head, neck, or axilla noted.  Eyes: No conjunctival inflammation or lid edema is present. There is no scleral icterus. Ears:  External ear exam shows no significant lesions or deformities.  Otoscopic examination reveals clear canals, tympanic membranes are intact bilaterally without bulging, retraction, inflammation or discharge. Nose:  External nasal examination shows no deformity or inflammation.   Oral exam:  lips and gums are healthy appearing.There is no oropharyngeal erythema or exudate noted.  Neck:  No  deformities, thyromegaly, masses, or tenderness noted.   Supple with full range of motion without pain.  Heart:  Normal rate and regular rhythm. S1 and S2 normal without gallop, murmur, click, rub or other extra sounds.  Lungs:Chest clear to auscultation; no wheezes, rhonchi,rales ,or rubs present. Extremities:  No cyanosis, edema, or clubbing  noted  Skin: Warm & dry w/o jaundice or tenting.      Assessment & Plan:  #1  URI, acute Plan: See orders and recommendations

## 2015-02-09 ENCOUNTER — Ambulatory Visit (INDEPENDENT_AMBULATORY_CARE_PROVIDER_SITE_OTHER): Payer: 59 | Admitting: Internal Medicine

## 2015-02-09 ENCOUNTER — Encounter: Payer: Self-pay | Admitting: Internal Medicine

## 2015-02-09 ENCOUNTER — Other Ambulatory Visit (INDEPENDENT_AMBULATORY_CARE_PROVIDER_SITE_OTHER): Payer: 59

## 2015-02-09 ENCOUNTER — Other Ambulatory Visit: Payer: Self-pay | Admitting: Internal Medicine

## 2015-02-09 ENCOUNTER — Ambulatory Visit (INDEPENDENT_AMBULATORY_CARE_PROVIDER_SITE_OTHER)
Admission: RE | Admit: 2015-02-09 | Discharge: 2015-02-09 | Disposition: A | Discharge status: Home / Self Care | Payer: 59 | Source: Ambulatory Visit | Attending: Internal Medicine | Admitting: Internal Medicine

## 2015-02-09 VITALS — BP 98/62 | HR 76 | Temp 98.2°F | Ht 69.0 in | Wt 201.0 lb

## 2015-02-09 DIAGNOSIS — R0609 Other forms of dyspnea: Secondary | ICD-10-CM

## 2015-02-09 DIAGNOSIS — J069 Acute upper respiratory infection, unspecified: Secondary | ICD-10-CM

## 2015-02-09 DIAGNOSIS — J441 Chronic obstructive pulmonary disease with (acute) exacerbation: Secondary | ICD-10-CM

## 2015-02-09 DIAGNOSIS — J209 Acute bronchitis, unspecified: Secondary | ICD-10-CM

## 2015-02-09 LAB — CBC WITH DIFFERENTIAL/PLATELET
BASOS ABS: 0 10*3/uL (ref 0.0–0.1)
Basophils Relative: 0.3 % (ref 0.0–3.0)
EOS ABS: 0.1 10*3/uL (ref 0.0–0.7)
Eosinophils Relative: 1 % (ref 0.0–5.0)
HEMATOCRIT: 38.1 % — AB (ref 39.0–52.0)
Hemoglobin: 12.6 g/dL — ABNORMAL LOW (ref 13.0–17.0)
LYMPHS ABS: 1.3 10*3/uL (ref 0.7–4.0)
Lymphocytes Relative: 9.7 % — ABNORMAL LOW (ref 12.0–46.0)
MCHC: 33 g/dL (ref 30.0–36.0)
MCV: 87 fl (ref 78.0–100.0)
Monocytes Absolute: 0.8 10*3/uL (ref 0.1–1.0)
Monocytes Relative: 5.9 % (ref 3.0–12.0)
NEUTROS ABS: 11.2 10*3/uL — AB (ref 1.4–7.7)
Neutrophils Relative %: 83.1 % — ABNORMAL HIGH (ref 43.0–77.0)
Platelets: 254 10*3/uL (ref 150.0–400.0)
RBC: 4.38 Mil/uL (ref 4.22–5.81)
RDW: 14.5 % (ref 11.5–15.5)
WBC: 13.5 10*3/uL — ABNORMAL HIGH (ref 4.0–10.5)

## 2015-02-09 MED ORDER — PREDNISONE 20 MG PO TABS: 20.0000 mg | ORAL_TABLET | Freq: Two times a day (BID) | ORAL | Status: DC

## 2015-02-09 MED ORDER — LEVOFLOXACIN 500 MG PO TABS: 500.0000 mg | ORAL_TABLET | Freq: Every day | ORAL | Status: DC

## 2015-02-09 NOTE — Progress Notes (Signed)
   Subjective:    Patient ID: Garrett [name], male    DOB: November 30, 1951, 64 y.o.   MRN: [id number]  HPI   He was awakened approximately 2 AM this morning  with dyspnea in the context of cough related to postnasal drainage. He has been progressively short of breath today;profoundly so  simply walking 30 feet from room to room.  He did have some edema bilaterally yesterday but this has resolved without treatment  His nasal congestion postnasal drainage has improved with nasal hygiene. His cough was productive this morning; he did not visualize the sputum as the room was dark. He's had a dry cough since  He describes "freezing" yesterday but this is not present today.  He does have a history of smoking for over 2 decades up to 3 packs per day.  CBC and differential revealed hemoglobin of 12.6 and hematocrit 38.1 today. Postoperatively his hemoglobin was 10.5 and hematocrit 31. He denies any bleeding dyscrasias. The white count today is 13,500 with a left shift.    Review of Systems He denies chest pain or hemoptysis. Frontal headache, facial pain , nasal purulence, dental pain, sore throat , otic pain or otic discharge denied. No fever  or sweats. Epistaxis, hematuria, melena, or rectal bleeding denied. No unexplained weight loss, significant dyspepsia,dysphagia, or abdominal pain.  There is no abnormal bruising , bleeding, or difficulty stopping bleeding with injury. His glucoses have been up to 200 over the Christmas holidays due to dietary indiscretion. Last A1c on record was 5.6% on 09/26/14.    Objective:   Physical Exam  Positive or pertinent findings include: He appears fatigued but in no acute distress he has an upper dental plate. Heart sounds are distant; he is barrel chested. He exhibits no neck vein distention at 10; he has no hepatojugular reflux. Pedal pulses are decreased.  Homans sign is negative bilaterally. The lumbosacral operative scar is well-healed with eschar.  There is no evidence of purulence or cellulitis.  General appearance :adequately nourished Eyes: No conjunctival inflammation or scleral icterus is present. Oral exam: Lips and gums are healthy appearing.There is no oropharyngeal erythema or exudate noted.  Heart:  Normal rate and regular rhythm. S1 and S2 normal without gallop, murmur, click, rub or other extra sounds   Lungs:Chest clear to auscultation; no wheezes, rhonchi,rales ,or rubs present.No increased work of breathing.  Abdomen: bowel sounds normal, soft and non-tender without masses, organomegaly or hernias noted.  No guarding or rebound.  Vascular : all pulses equal ; no bruits present. Skin:Warm & dry.  Intact without suspicious lesions or rashes ; no jaundice or tenting Lymphatic: No lymphadenopathy is noted about the head, neck, axilla Neuro: Strength, tone  normal.      Assessment & Plan:  #1 acute exertional dyspnea and possible paroxysmal nocturnal dyspnea  #2 anemia, improving    #3 leukocytosis; rule out possible pneumonia as etiology ;CXray report pending  #4 COPD.in context of > 60 pack years  Plan: Final diagnosis will require review of his chest x-ray which is still pending.  He is on narcotic pain medicines for his back so narcotic cough syrup will not be prescribed. Prednisone prescribed; last A1c was in non diabetic range.Change antibiotic to Levaquin.

## 2015-02-09 NOTE — Patient Instructions (Signed)
  Your next office appointment will be determined based upon review of your pending  x-rays. Those instructions will be transmitted to you through My Chart Critical values will be called. Followup as needed for any active or acute issue. Please report any significant change in your symptoms.

## 2015-02-09 NOTE — Progress Notes (Signed)
Pre visit review using our clinic review tool, if applicable. No additional management support is needed unless otherwise documented below in the visit note. 

## 2015-02-10 ENCOUNTER — Other Ambulatory Visit: Payer: Self-pay | Admitting: Internal Medicine

## 2015-02-10 DIAGNOSIS — E785 Hyperlipidemia, unspecified: Principal | ICD-10-CM

## 2015-02-10 DIAGNOSIS — E1169 Type 2 diabetes mellitus with other specified complication: Secondary | ICD-10-CM

## 2015-02-13 ENCOUNTER — Telehealth: Payer: Self-pay | Admitting: Internal Medicine

## 2015-02-13 NOTE — Telephone Encounter (Signed)
Patient has fu coming up.  He also has a cpe scheduled that will need to be rescheduled.  Can we turn the follow up into a cpe?

## 2015-02-14 ENCOUNTER — Other Ambulatory Visit: Payer: Self-pay | Admitting: Internal Medicine

## 2015-02-14 ENCOUNTER — Encounter: Payer: Self-pay | Admitting: Internal Medicine

## 2015-02-14 MED ORDER — LEVOFLOXACIN 500 MG PO TABS: 500.0000 mg | ORAL_TABLET | Freq: Every day | ORAL | Status: DC

## 2015-02-16 NOTE — Telephone Encounter (Signed)
Got scheduled with DR. Advice worker for [employer]

## 2015-03-21 ENCOUNTER — Other Ambulatory Visit (INDEPENDENT_AMBULATORY_CARE_PROVIDER_SITE_OTHER): Payer: 59

## 2015-03-21 ENCOUNTER — Ambulatory Visit (INDEPENDENT_AMBULATORY_CARE_PROVIDER_SITE_OTHER): Payer: 59 | Admitting: Internal Medicine

## 2015-03-21 ENCOUNTER — Encounter: Payer: Self-pay | Admitting: Internal Medicine

## 2015-03-21 VITALS — BP 128/80 | HR 75 | Temp 97.5°F | Resp 16 | Wt 208.0 lb

## 2015-03-21 DIAGNOSIS — I10 Essential (primary) hypertension: Secondary | ICD-10-CM | POA: Diagnosis not present

## 2015-03-21 DIAGNOSIS — E119 Type 2 diabetes mellitus without complications: Secondary | ICD-10-CM

## 2015-03-21 DIAGNOSIS — E785 Hyperlipidemia, unspecified: Secondary | ICD-10-CM | POA: Diagnosis not present

## 2015-03-21 DIAGNOSIS — E1169 Type 2 diabetes mellitus with other specified complication: Secondary | ICD-10-CM

## 2015-03-21 LAB — BASIC METABOLIC PANEL
BUN: 21 mg/dL (ref 6–23)
CALCIUM: 9.5 mg/dL (ref 8.4–10.5)
CO2: 25 meq/L (ref 19–32)
Chloride: 110 mEq/L (ref 96–112)
Creatinine, Ser: 1.08 mg/dL (ref 0.40–1.50)
GFR: 72.95 mL/min (ref >60.00)
Glucose, Bld: 118 mg/dL — ABNORMAL HIGH (ref 70–99)
Potassium: 3.9 mEq/L (ref 3.5–5.1)
SODIUM: 142 meq/L (ref 135–145)

## 2015-03-21 LAB — MICROALBUMIN / CREATININE URINE RATIO
Creatinine,U: 24.5 mg/dL
MICROALB UR: 0.9 mg/dL (ref 0.0–1.9)
Microalb Creat Ratio: 3.7 mg/g (ref 0.0–30.0)

## 2015-03-21 LAB — LIPID PANEL
Cholesterol: 134 mg/dL (ref 0–200)
HDL: 44.2 mg/dL (ref >39.00)
LDL Cholesterol: 69 mg/dL (ref 0–99)
NonHDL: 89.8
Total CHOL/HDL Ratio: 3
Triglycerides: 104 mg/dL (ref 0.0–149.0)
VLDL: 20.8 mg/dL (ref 0.0–40.0)

## 2015-03-21 LAB — HEMOGLOBIN A1C: Hgb A1c MFr Bld: 6.2 % (ref 4.6–6.5)

## 2015-03-21 MED ORDER — ALLOPURINOL 100 MG PO TABS: 100.0000 mg | ORAL_TABLET | Freq: Every day | ORAL | Status: DC

## 2015-03-21 MED ORDER — PRAVASTATIN SODIUM 20 MG PO TABS
20.0000 mg | ORAL_TABLET | Freq: Every day | ORAL | Status: DC
Start: 2015-03-21 — End: 2015-04-11

## 2015-03-21 MED ORDER — RAMIPRIL 10 MG PO CAPS: 10.0000 mg | ORAL_CAPSULE | Freq: Every day | ORAL | Status: DC

## 2015-03-21 MED ORDER — TERAZOSIN HCL 5 MG PO CAPS: 5.0000 mg | ORAL_CAPSULE | Freq: Every day | ORAL | Status: DC

## 2015-03-21 MED ORDER — METOPROLOL TARTRATE 100 MG PO TABS: 50.0000 mg | ORAL_TABLET | Freq: Every day | ORAL | Status: DC

## 2015-03-21 NOTE — Assessment & Plan Note (Addendum)
BP Readings from Last 3 Encounters:  03/21/15 128/80  02/09/15 98/62  02/08/15 134/76   Multi med tx reviewed -  Previously, self changed BID lopressor to qd now Home BP log shows control is effective The current medical regimen is effective;  continue present plan and medications.

## 2015-03-21 NOTE — Patient Instructions (Signed)
It was good to see you today.  We have reviewed your prior records including labs and tests today  Test(s) ordered today. Your results will be released to [place] (or called to you) after review, usually within 72hours after test completion. If any changes need to be made, you will be notified at that same time.  Medications reviewed and updated, no changes recommended at this time. Refill on medication(s) as discussed today.  Please schedule followup in 6 months for diabetes mellitus check, call sooner if problems.

## 2015-03-21 NOTE — Assessment & Plan Note (Signed)
Not taking metformin or Victoza since 06/25/14 due to "crashing" hypoglycemia with new diet efforts Prev rx'd Victoza 08/2013 (low dose) to help with weight loss efforts and DM control -  Check a1c now and adjust tx as needed - resume Victoza if a1c >7 On ACEI, ASA 81 and statin (goal LDL <100) The patient is asked to make continued attempts to improve diet and exercise patterns to aid in medical management of this problem.  Lab Results  Component Value Date   HGBA1C 5.6 09/26/2014
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
## 2015-03-21 NOTE — Assessment & Plan Note (Signed)
On prava Check lipids annually, goal LDL<100

## 2015-03-21 NOTE — Progress Notes (Signed)
Pre visit review using our clinic review tool, if applicable. No additional management support is needed unless otherwise documented below in the visit note. 

## 2015-03-23 ENCOUNTER — Ambulatory Visit: Payer: 59 | Attending: Neurosurgery | Admitting: Physical Therapy

## 2015-03-23 DIAGNOSIS — M2560 Stiffness of unspecified joint, not elsewhere classified: Secondary | ICD-10-CM

## 2015-03-23 DIAGNOSIS — M545 Low back pain, unspecified: Secondary | ICD-10-CM

## 2015-03-23 DIAGNOSIS — R293 Abnormal posture: Secondary | ICD-10-CM | POA: Diagnosis not present

## 2015-03-23 DIAGNOSIS — R29898 Other symptoms and signs involving the musculoskeletal system: Secondary | ICD-10-CM | POA: Diagnosis not present

## 2015-03-23 DIAGNOSIS — Z9181 History of falling: Secondary | ICD-10-CM | POA: Diagnosis not present

## 2015-03-23 NOTE — Patient Instructions (Signed)
   Haron Beilke PT, DPT, LAT, ATC  [facility] Phone: [phone]     
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
## 2015-03-28 ENCOUNTER — Ambulatory Visit: Payer: 59 | Admitting: Internal Medicine

## 2015-04-04 ENCOUNTER — Ambulatory Visit: Payer: 59 | Attending: Neurosurgery | Admitting: Physical Therapy

## 2015-04-04 DIAGNOSIS — M545 Low back pain, unspecified: Secondary | ICD-10-CM

## 2015-04-04 DIAGNOSIS — R29898 Other symptoms and signs involving the musculoskeletal system: Secondary | ICD-10-CM | POA: Diagnosis not present

## 2015-04-04 DIAGNOSIS — M2560 Stiffness of unspecified joint, not elsewhere classified: Secondary | ICD-10-CM

## 2015-04-04 DIAGNOSIS — Z9181 History of falling: Secondary | ICD-10-CM | POA: Diagnosis not present

## 2015-04-04 DIAGNOSIS — R293 Abnormal posture: Secondary | ICD-10-CM
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
## 2015-04-11 ENCOUNTER — Encounter: Payer: 59 | Admitting: Internal Medicine

## 2015-04-11 ENCOUNTER — Encounter: Payer: Self-pay | Admitting: Internal Medicine

## 2015-04-11 ENCOUNTER — Ambulatory Visit: Payer: 59 | Admitting: Physical Therapy

## 2015-04-11 ENCOUNTER — Ambulatory Visit (INDEPENDENT_AMBULATORY_CARE_PROVIDER_SITE_OTHER): Payer: 59 | Admitting: Internal Medicine

## 2015-04-11 VITALS — BP 118/82 | HR 67 | Temp 97.4°F | Resp 14 | Ht 69.0 in | Wt 210.0 lb

## 2015-04-11 DIAGNOSIS — Z86010 Personal history of colonic polyps: Secondary | ICD-10-CM

## 2015-04-11 DIAGNOSIS — R7309 Other abnormal glucose: Secondary | ICD-10-CM | POA: Diagnosis not present

## 2015-04-11 DIAGNOSIS — E785 Hyperlipidemia, unspecified: Secondary | ICD-10-CM

## 2015-04-11 DIAGNOSIS — M2560 Stiffness of unspecified joint, not elsewhere classified: Secondary | ICD-10-CM

## 2015-04-11 DIAGNOSIS — R29898 Other symptoms and signs involving the musculoskeletal system: Secondary | ICD-10-CM

## 2015-04-11 DIAGNOSIS — R7303 Prediabetes: Secondary | ICD-10-CM

## 2015-04-11 DIAGNOSIS — Z0000 Encounter for general adult medical examination without abnormal findings: Secondary | ICD-10-CM

## 2015-04-11 DIAGNOSIS — M545 Low back pain, unspecified: Secondary | ICD-10-CM

## 2015-04-11 DIAGNOSIS — Z9181 History of falling: Secondary | ICD-10-CM

## 2015-04-11 DIAGNOSIS — I10 Essential (primary) hypertension: Secondary | ICD-10-CM

## 2015-04-11 DIAGNOSIS — R293 Abnormal posture: Secondary | ICD-10-CM

## 2015-04-11 NOTE — Assessment & Plan Note (Signed)
11/11/2014 lumbar fusion, Dr Hillery Jacks

## 2015-04-11 NOTE — Patient Instructions (Signed)
Extensors / Rotators, Supine   Lie on your back, one leg straight, other leg bent, knee held by opposite hand. Gently pull knee toward opposite shoulder. Feel stretch in buttocks and outside of hip. Hold _30__ seconds. Repeat _3__ times per session. Do _2__ sessions per day.  Copyright  VHI. All rights reserved.

## 2015-04-11 NOTE — Progress Notes (Signed)
Subjective:    Patient ID: Garrett [name], male    DOB: 1951/02/02, 64 y.o.   MRN: [id number]  HPI  He is here for a physical;acute issues denied.  He has been compliant with his medications without adverse effects. He is on a low-carb, high-protein diet. He had neurosurgery 11/11/14 for neurogenic claudication. He was fairly sedentary from December to March of this year. Because of this he gained partially 9 pounds.   Blood pressure has been well controlled with a range of 110-130/70-80.  He has not been on any medications for hyperglycemia since the Fall of 2015. Fasting blood sugars range 85-115. Ophthalmologic exam is up-to-date.  He's had chronic pain in his calves which is nonexertional. This was not impacted by his neurosurgery in 2015.He has had numbness in feet for 15 years; in the past weight loss has improved this.  Colonoscopy would be due in August of this year. He had 3 adenomas removed in 2013 by Dr. [name]. He has no active GI symptoms.   Review of Systems   Chest pain, palpitations, tachycardia, exertional dyspnea, paroxysmal nocturnal dyspnea, claudication or edema are absent.  Unexplained weight loss, abdominal pain, significant dyspepsia, dysphagia, melena, rectal bleeding, or persistently small caliber stools are denied.  Polyuria, polyphagia, polydipsia absent.  There is no blurred vision, double vision, or loss of vision.   No postural dizziness noted. Denied are tingling or burning of the extremities with the numbness.  No nonhealing skin lesions present.       Objective:   Physical Exam Gen.: Adequately nourished in appearance. Alert, appropriate and cooperative throughout exam. BMI:31 Appears younger than stated age  Head: Normocephalic without obvious abnormalities;pattern alopecia  Eyes: No corneal or conjunctival inflammation noted. Pupils equal round reactive to light and accommodation. Extraocular motion intact.  Ears: External  ear exam reveals no  significant lesions or deformities. Canals clear .TMs normal. Hearing is grossly normal bilaterally. Nose: External nasal exam reveals no deformity or inflammation. Nasal mucosa are pink and moist. No lesions or exudates noted.   Mouth: Oral mucosa and oropharynx reveal no lesions or exudates. Teeth in good repair. Neck: No deformities, masses, or tenderness noted. Range of motion &  Thyroid normal. Lungs: Barrel chested.Chest surprisingly clear.Normal respiratory effort; chest expands symmetrically. Lungs are clear to auscultation without rales, wheezes, or increased work of breathing. Heart: Sounds distant.Normal rate and rhythm. Normal S1 and S2. No gallop, click, or rub.No murmur. Abdomen: Protuberant.Bowel sounds normal; abdomen soft and nontender. No masses, organomegaly or hernias noted. Genitalia: Genitalia normal except for left varices & vasectomy scar tissue. Prostate is normal without enlargement, asymmetry, nodularity, or induration                   Musculoskeletal/extremities: No deformity or scoliosis noted of  the thoracic or lumbar spine.  No clubbing, cyanosis, edema, or significant extremity  deformity noted.  Range of motion normal . Tone & strength normal. Hand joints normal Fingernail  health good. Crepitus of knees  Able to lie down & sit up w/o help.  Negative SLR bilaterally Vascular: Carotid, radial artery, dorsalis pedis and  posterior tibial pulses are equal. Decreased pedal pulses.No bruits present. Neurologic: Alert and oriented x3. Deep tendon reflexes symmetrical and normal.  Gait normal       Skin: Intact without suspicious lesions or rashes. Lymph: No cervical, axillary, or inguinal lymphadenopathy present. Psych: Mood and affect are normal. Normally interactive  Assessment & Plan:  #1 comprehensive physical exam; no acute findings  Plan: see Orders  &  Recommendations

## 2015-04-11 NOTE — Progress Notes (Signed)
Pre visit review using our clinic review tool, if applicable. No additional management support is needed unless otherwise documented below in the visit note. 
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
## 2015-04-11 NOTE — Patient Instructions (Addendum)
Minimal Blood Pressure Goal= AVERAGE < 140/90;  Ideal is an AVERAGE < 135/85. This AVERAGE should be calculated from @ least 5-7 BP readings taken @ different times of day on different days of week. You should not respond to isolated BP readings , but rather the AVERAGE for that week .Please bring your  blood pressure cuff to office visits to verify that it is reliable.It  can also be checked against the blood pressure device at the pharmacy. Finger or wrist cuffs are not dependable; an arm cuff is.  As per the Standard of Care , screening Colonoscopy recommended @ 50 & every 5-10 years thereafter . More frequent monitor would be dictated by family history or findings @ Colonoscopy. Check with Dr Eugenia Pancoast office as to when to repeat this.It appears it is due after August this year.  Decrease Pravastatin to 20 mg 1/2 daily and check A1c and NMR Lipoprofile Panel in late July.Marland Kitchen

## 2015-04-17 ENCOUNTER — Ambulatory Visit: Payer: 59 | Admitting: Physical Therapy

## 2015-04-17 DIAGNOSIS — R29898 Other symptoms and signs involving the musculoskeletal system: Secondary | ICD-10-CM

## 2015-04-17 DIAGNOSIS — M545 Low back pain, unspecified: Secondary | ICD-10-CM

## 2015-04-17 DIAGNOSIS — R293 Abnormal posture: Secondary | ICD-10-CM

## 2015-04-17 DIAGNOSIS — Z9181 History of falling: Secondary | ICD-10-CM

## 2015-04-17 DIAGNOSIS — M2560 Stiffness of unspecified joint, not elsewhere classified: Secondary | ICD-10-CM
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
## 2015-04-19 ENCOUNTER — Ambulatory Visit: Payer: 59 | Admitting: Physical Therapy

## 2015-04-19 DIAGNOSIS — M545 Low back pain, unspecified: Secondary | ICD-10-CM

## 2015-04-19 DIAGNOSIS — R29898 Other symptoms and signs involving the musculoskeletal system: Secondary | ICD-10-CM

## 2015-04-19 DIAGNOSIS — R293 Abnormal posture: Secondary | ICD-10-CM

## 2015-04-19 DIAGNOSIS — Z9181 History of falling: Secondary | ICD-10-CM

## 2015-04-19 DIAGNOSIS — M2560 Stiffness of unspecified joint, not elsewhere classified: Secondary | ICD-10-CM
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
## 2015-04-19 NOTE — Patient Instructions (Signed)
Quads / HF, Prone   Lie face down, knees together. Wrap sheet around foot and grasp other end of sheet to reach a stretch. Gently pull foot toward buttock. Hold _30__ seconds. Repeat _3__ times per session. Repeat on the other leg.  Do _2__ sessions per day.  Copyright  VHI. All rights reserved.

## 2015-04-24 ENCOUNTER — Ambulatory Visit: Payer: 59 | Attending: Neurosurgery | Admitting: Physical Therapy

## 2015-04-24 DIAGNOSIS — Z9181 History of falling: Secondary | ICD-10-CM | POA: Insufficient documentation

## 2015-04-24 DIAGNOSIS — M2560 Stiffness of unspecified joint, not elsewhere classified: Secondary | ICD-10-CM | POA: Insufficient documentation

## 2015-04-24 DIAGNOSIS — R293 Abnormal posture: Secondary | ICD-10-CM | POA: Insufficient documentation

## 2015-04-24 DIAGNOSIS — M545 Low back pain, unspecified: Secondary | ICD-10-CM

## 2015-04-24 DIAGNOSIS — R29898 Other symptoms and signs involving the musculoskeletal system: Secondary | ICD-10-CM | POA: Diagnosis not present
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
## 2015-04-26 ENCOUNTER — Ambulatory Visit: Payer: 59 | Admitting: Physical Therapy

## 2015-04-26 DIAGNOSIS — M2560 Stiffness of unspecified joint, not elsewhere classified: Secondary | ICD-10-CM

## 2015-04-26 DIAGNOSIS — R293 Abnormal posture: Secondary | ICD-10-CM

## 2015-04-26 DIAGNOSIS — M545 Low back pain, unspecified: Secondary | ICD-10-CM

## 2015-04-26 DIAGNOSIS — Z9181 History of falling: Secondary | ICD-10-CM

## 2015-04-26 DIAGNOSIS — R29898 Other symptoms and signs involving the musculoskeletal system: Secondary | ICD-10-CM
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
## 2015-04-27 ENCOUNTER — Other Ambulatory Visit: Payer: Self-pay | Admitting: Internal Medicine

## 2015-05-02 ENCOUNTER — Ambulatory Visit: Payer: 59 | Admitting: Physical Therapy

## 2015-05-02 DIAGNOSIS — M545 Low back pain, unspecified: Secondary | ICD-10-CM

## 2015-05-02 DIAGNOSIS — M2560 Stiffness of unspecified joint, not elsewhere classified: Secondary | ICD-10-CM

## 2015-05-02 DIAGNOSIS — Z9181 History of falling: Secondary | ICD-10-CM

## 2015-05-02 DIAGNOSIS — R293 Abnormal posture: Secondary | ICD-10-CM
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
## 2015-05-02 NOTE — Patient Instructions (Addendum)
USE HAND RAIL AND STEP UP 10 TIMES ON EACH LEG Forward   Facing step, place one leg on step, flexed at hip. Step up slowly, bringing hips in line with knee and shoulder. Bring other foot onto step. Reverse process to step back down. Repeat with other leg. Do __10__ repetitions, __2__ sets.  http://bt.exer.us/154   Copyright  VHI. All rights reserved.  Abduction: Side Leg Lift (Eccentric) - Side-Lying   Lie on side. Lift top leg slightly higher than shoulder level. Keep top leg straight with body, toes pointing forward. Slowly lower for 3-5 seconds. __10_ reps per set, _2__ sets per day, _7__ days per week. Add ___ lbs when you achieve ___ repetitions.  Copyright  VHI. All rights reserved.

## 2015-05-04 ENCOUNTER — Ambulatory Visit: Payer: 59 | Admitting: Physical Therapy

## 2015-05-04 DIAGNOSIS — Z9181 History of falling: Secondary | ICD-10-CM

## 2015-05-04 DIAGNOSIS — R293 Abnormal posture: Secondary | ICD-10-CM

## 2015-05-04 DIAGNOSIS — M545 Low back pain, unspecified: Secondary | ICD-10-CM

## 2015-05-04 DIAGNOSIS — R29898 Other symptoms and signs involving the musculoskeletal system: Secondary | ICD-10-CM

## 2015-05-04 DIAGNOSIS — M2560 Stiffness of unspecified joint, not elsewhere classified: Secondary | ICD-10-CM
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
## 2015-05-10 ENCOUNTER — Ambulatory Visit: Payer: 59 | Admitting: Physical Therapy

## 2015-05-10 DIAGNOSIS — R29898 Other symptoms and signs involving the musculoskeletal system: Secondary | ICD-10-CM

## 2015-05-10 DIAGNOSIS — M545 Low back pain, unspecified: Secondary | ICD-10-CM

## 2015-05-10 DIAGNOSIS — Z9181 History of falling: Secondary | ICD-10-CM

## 2015-05-10 DIAGNOSIS — M2560 Stiffness of unspecified joint, not elsewhere classified: Secondary | ICD-10-CM

## 2015-05-10 DIAGNOSIS — R293 Abnormal posture: Secondary | ICD-10-CM
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
## 2015-05-12 ENCOUNTER — Ambulatory Visit: Payer: 59 | Admitting: Physical Therapy

## 2015-05-12 DIAGNOSIS — M545 Low back pain, unspecified: Secondary | ICD-10-CM

## 2015-05-12 DIAGNOSIS — Z9181 History of falling: Secondary | ICD-10-CM

## 2015-05-12 DIAGNOSIS — M2560 Stiffness of unspecified joint, not elsewhere classified: Secondary | ICD-10-CM

## 2015-05-12 DIAGNOSIS — R293 Abnormal posture: Secondary | ICD-10-CM

## 2015-05-12 DIAGNOSIS — R29898 Other symptoms and signs involving the musculoskeletal system: Secondary | ICD-10-CM

## 2015-05-12 NOTE — Patient Instructions (Signed)
   Kristoffer Leamon PT, DPT, LAT, ATC  [facility] Phone: [phone]     
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
## 2015-05-15 ENCOUNTER — Ambulatory Visit: Payer: 59 | Admitting: Physical Therapy

## 2015-05-15 DIAGNOSIS — M545 Low back pain, unspecified: Secondary | ICD-10-CM

## 2015-05-15 DIAGNOSIS — R293 Abnormal posture: Secondary | ICD-10-CM

## 2015-05-15 DIAGNOSIS — Z9181 History of falling: Secondary | ICD-10-CM

## 2015-05-15 DIAGNOSIS — R29898 Other symptoms and signs involving the musculoskeletal system: Secondary | ICD-10-CM

## 2015-05-15 DIAGNOSIS — M2560 Stiffness of unspecified joint, not elsewhere classified: Secondary | ICD-10-CM
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
## 2015-05-17 ENCOUNTER — Ambulatory Visit: Payer: 59 | Admitting: Physical Therapy

## 2015-05-17 DIAGNOSIS — M545 Low back pain, unspecified: Secondary | ICD-10-CM

## 2015-05-17 DIAGNOSIS — R293 Abnormal posture: Secondary | ICD-10-CM

## 2015-05-17 DIAGNOSIS — Z9181 History of falling: Secondary | ICD-10-CM

## 2015-05-17 DIAGNOSIS — R29898 Other symptoms and signs involving the musculoskeletal system: Secondary | ICD-10-CM

## 2015-05-17 DIAGNOSIS — M2560 Stiffness of unspecified joint, not elsewhere classified: Secondary | ICD-10-CM

## 2015-05-17 NOTE — Patient Instructions (Signed)
   Infiniti Hoefling PT, DPT, LAT, ATC  [facility] Phone: [phone]     
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
## 2015-05-18 ENCOUNTER — Ambulatory Visit: Payer: 59 | Admitting: Physician Assistant

## 2015-05-23 ENCOUNTER — Ambulatory Visit: Payer: 59 | Admitting: Physical Therapy

## 2015-05-23 DIAGNOSIS — M545 Low back pain, unspecified: Secondary | ICD-10-CM

## 2015-05-23 DIAGNOSIS — R293 Abnormal posture: Secondary | ICD-10-CM

## 2015-05-23 DIAGNOSIS — R29898 Other symptoms and signs involving the musculoskeletal system: Secondary | ICD-10-CM

## 2015-05-23 DIAGNOSIS — M2560 Stiffness of unspecified joint, not elsewhere classified: Secondary | ICD-10-CM

## 2015-05-23 DIAGNOSIS — Z9181 History of falling: Secondary | ICD-10-CM
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
## 2015-05-25 ENCOUNTER — Ambulatory Visit: Payer: 59 | Attending: Neurosurgery | Admitting: Physical Therapy

## 2015-05-25 DIAGNOSIS — M545 Low back pain, unspecified: Secondary | ICD-10-CM

## 2015-05-25 DIAGNOSIS — M2560 Stiffness of unspecified joint, not elsewhere classified: Secondary | ICD-10-CM | POA: Diagnosis present

## 2015-05-25 DIAGNOSIS — Z9181 History of falling: Secondary | ICD-10-CM | POA: Insufficient documentation

## 2015-05-25 DIAGNOSIS — R293 Abnormal posture: Secondary | ICD-10-CM | POA: Insufficient documentation

## 2015-05-25 DIAGNOSIS — R29898 Other symptoms and signs involving the musculoskeletal system: Secondary | ICD-10-CM | POA: Diagnosis present
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
## 2015-05-29 ENCOUNTER — Ambulatory Visit: Payer: 59 | Admitting: Physical Therapy

## 2015-05-29 DIAGNOSIS — M545 Low back pain, unspecified: Secondary | ICD-10-CM

## 2015-05-29 DIAGNOSIS — R293 Abnormal posture: Secondary | ICD-10-CM

## 2015-05-29 DIAGNOSIS — R29898 Other symptoms and signs involving the musculoskeletal system: Secondary | ICD-10-CM

## 2015-05-29 DIAGNOSIS — M2560 Stiffness of unspecified joint, not elsewhere classified: Secondary | ICD-10-CM

## 2015-05-29 DIAGNOSIS — Z9181 History of falling: Secondary | ICD-10-CM

## 2015-05-29 NOTE — Patient Instructions (Addendum)
   Heel Raise: Bilateral (Standing)   Rise on balls of feet. Repeat ____ times per set. Do ____ sets per session. Do ____ sessions per day.  http://orth.exer.us/38   Gastroc / Heel Cord Stretch - On Step   Stand with heels over edge of stair. Holding rail, lower heels until stretch is felt in calf of legs. Can do one at a time. Hold 30 sec to 60 sec. Repeat _3__ times. Do _2__ times per day.  Copyright  VHI. All rights reserved.    Dorsiflexion: Resisted   Facing anchor, tubing around left foot, pull toward face.  Repeat _15-20___ times per set. Do __2__ sets per session. Do __2__ sessions per day.  http://orth.exer.us/8   Copyright  VHI. All rights reserved.
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
## 2015-05-30 ENCOUNTER — Encounter: Payer: Self-pay | Admitting: *Deleted

## 2015-05-30 ENCOUNTER — Other Ambulatory Visit: Payer: Self-pay | Admitting: *Deleted

## 2015-05-30 NOTE — Patient Outreach (Signed)
Successful outreach to this [employer] member related to high cost referral for back surgery, lumbar two-three Maximum Access Surgery Posterior Lumbar Interbody Fusion on 11/14/14 by Dr. Annette Stable. Member states he is doing well, still participating in outpatient therapy, doing some part time work. He states he takes medication for HTN and cholesterol and  lost >50 lbs of weight through CHO counting and increasing protein intake. He says the dose of his cholesterol medication was decreased when he had his annual wellness exam in April. He states he monitors his blood pressure as directed by his primary care physician.  Discussed [place] [employer] plan benefits of nutritional counseling and the Link To Wellness programs for self management assistance with HTN. Member gladly excepted offer of voucher to purchase home blood pressure monitor at a [place] outpatient pharmacy for $5. With member's permission, will mail information related to [place] [facility] services, Link To Wellness programs and services offered at the [facility] along with this RNCM's contact information. Barrington Ellison RN,CCM,CDE [facility] Management Coordinator Link To Wellness Office Phone [phone] Office Fax [phone]

## 2015-05-31 ENCOUNTER — Ambulatory Visit: Payer: 59 | Admitting: Physical Therapy

## 2015-06-05 ENCOUNTER — Ambulatory Visit: Payer: 59 | Admitting: Physical Therapy

## 2015-06-05 DIAGNOSIS — Z9181 History of falling: Secondary | ICD-10-CM

## 2015-06-05 DIAGNOSIS — M545 Low back pain, unspecified: Secondary | ICD-10-CM

## 2015-06-05 DIAGNOSIS — R293 Abnormal posture: Secondary | ICD-10-CM

## 2015-06-05 DIAGNOSIS — M2560 Stiffness of unspecified joint, not elsewhere classified: Secondary | ICD-10-CM

## 2015-06-05 DIAGNOSIS — R29898 Other symptoms and signs involving the musculoskeletal system: Secondary | ICD-10-CM
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
## 2015-06-07 ENCOUNTER — Ambulatory Visit: Payer: 59 | Admitting: Physical Therapy

## 2015-06-07 DIAGNOSIS — M545 Low back pain, unspecified: Secondary | ICD-10-CM

## 2015-06-07 DIAGNOSIS — Z9181 History of falling: Secondary | ICD-10-CM

## 2015-06-07 DIAGNOSIS — R293 Abnormal posture: Secondary | ICD-10-CM

## 2015-06-07 DIAGNOSIS — M2560 Stiffness of unspecified joint, not elsewhere classified: Secondary | ICD-10-CM

## 2015-06-07 DIAGNOSIS — R29898 Other symptoms and signs involving the musculoskeletal system: Secondary | ICD-10-CM
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
## 2015-06-08 ENCOUNTER — Encounter: Payer: Self-pay | Admitting: Gastroenterology

## 2015-06-13 ENCOUNTER — Ambulatory Visit: Payer: 59 | Admitting: Physical Therapy

## 2015-06-13 DIAGNOSIS — M545 Low back pain, unspecified: Secondary | ICD-10-CM

## 2015-06-13 DIAGNOSIS — R293 Abnormal posture: Secondary | ICD-10-CM

## 2015-06-13 DIAGNOSIS — Z9181 History of falling: Secondary | ICD-10-CM

## 2015-06-13 DIAGNOSIS — M2560 Stiffness of unspecified joint, not elsewhere classified: Secondary | ICD-10-CM

## 2015-06-13 DIAGNOSIS — R29898 Other symptoms and signs involving the musculoskeletal system: Secondary | ICD-10-CM
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
## 2015-06-16 ENCOUNTER — Ambulatory Visit: Payer: 59 | Admitting: Physical Therapy

## 2015-06-19 ENCOUNTER — Other Ambulatory Visit: Payer: Self-pay | Admitting: Internal Medicine

## 2015-06-20 ENCOUNTER — Ambulatory Visit: Payer: 59 | Admitting: Physical Therapy

## 2015-06-20 DIAGNOSIS — R29898 Other symptoms and signs involving the musculoskeletal system: Secondary | ICD-10-CM

## 2015-06-20 DIAGNOSIS — M545 Low back pain, unspecified: Secondary | ICD-10-CM

## 2015-06-20 DIAGNOSIS — M2560 Stiffness of unspecified joint, not elsewhere classified: Secondary | ICD-10-CM

## 2015-06-20 DIAGNOSIS — Z9181 History of falling: Secondary | ICD-10-CM

## 2015-06-20 DIAGNOSIS — R293 Abnormal posture: Secondary | ICD-10-CM
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
## 2015-06-20 NOTE — Patient Instructions (Signed)
Hip Flexor Stretch   Lying on back near edge of bed, bend one leg, foot flat. Hang other leg over edge, relaxed, thigh resting entirely on bed for _1___ minutes. Repeat __2__ times. Do2 ____ sessions per day. Advanced Exercise: Bend knee back keeping thigh in contact with bed or pull opposite knee to chest  http://[email or web address].exer.us/346   Copyright  VHI. All rights reserved.

## 2015-06-22 ENCOUNTER — Ambulatory Visit: Payer: 59 | Admitting: Physical Therapy

## 2015-06-28 ENCOUNTER — Ambulatory Visit: Payer: 59 | Attending: Neurosurgery | Admitting: Physical Therapy

## 2015-06-28 DIAGNOSIS — R293 Abnormal posture: Secondary | ICD-10-CM | POA: Diagnosis present

## 2015-06-28 DIAGNOSIS — M2560 Stiffness of unspecified joint, not elsewhere classified: Secondary | ICD-10-CM | POA: Diagnosis present

## 2015-06-28 DIAGNOSIS — Z9181 History of falling: Secondary | ICD-10-CM | POA: Insufficient documentation

## 2015-06-28 DIAGNOSIS — M545 Low back pain, unspecified: Secondary | ICD-10-CM

## 2015-06-28 DIAGNOSIS — R29898 Other symptoms and signs involving the musculoskeletal system: Secondary | ICD-10-CM | POA: Insufficient documentation
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
## 2015-06-30 ENCOUNTER — Ambulatory Visit: Payer: 59 | Admitting: Physical Therapy

## 2015-07-04 ENCOUNTER — Ambulatory Visit: Payer: 59 | Admitting: Physical Therapy

## 2015-07-04 DIAGNOSIS — Z9181 History of falling: Secondary | ICD-10-CM

## 2015-07-04 DIAGNOSIS — R293 Abnormal posture: Secondary | ICD-10-CM

## 2015-07-04 DIAGNOSIS — R29898 Other symptoms and signs involving the musculoskeletal system: Secondary | ICD-10-CM

## 2015-07-04 DIAGNOSIS — M545 Low back pain, unspecified: Secondary | ICD-10-CM

## 2015-07-04 DIAGNOSIS — M2560 Stiffness of unspecified joint, not elsewhere classified: Secondary | ICD-10-CM

## 2015-07-04 NOTE — Patient Instructions (Signed)
   Bellatrix Devonshire PT, DPT, LAT, ATC  [facility] Phone: [phone]     
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
## 2015-07-07 ENCOUNTER — Other Ambulatory Visit: Payer: Self-pay | Admitting: Internal Medicine

## 2015-07-11 ENCOUNTER — Other Ambulatory Visit: Payer: Self-pay

## 2015-07-11 ENCOUNTER — Ambulatory Visit: Payer: 59 | Admitting: Physical Therapy

## 2015-07-11 DIAGNOSIS — M545 Low back pain, unspecified: Secondary | ICD-10-CM

## 2015-07-11 DIAGNOSIS — Z9181 History of falling: Secondary | ICD-10-CM

## 2015-07-11 DIAGNOSIS — R29898 Other symptoms and signs involving the musculoskeletal system: Secondary | ICD-10-CM

## 2015-07-11 DIAGNOSIS — R293 Abnormal posture: Secondary | ICD-10-CM

## 2015-07-11 MED ORDER — MOMETASONE FUROATE 50 MCG/ACT NA SUSP: 2.0000 | Freq: Every day | NASAL | Status: DC
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
## 2015-07-13 ENCOUNTER — Telehealth: Payer: Self-pay | Admitting: Pulmonary Disease

## 2015-07-13 DIAGNOSIS — G4733 Obstructive sleep apnea (adult) (pediatric): Secondary | ICD-10-CM

## 2015-07-13 NOTE — Telephone Encounter (Signed)
Okay to send order to have [facility] assess CPAP machine.  I can follow up in office in place of [facility].

## 2015-07-13 NOTE — Telephone Encounter (Signed)
Wife states that the patient is having issues with his CPAP machine.  Is supposed to be set at 5-20cm - states that it is not ramping up like it is supposed to or auto adjusting and the pressure goes straight to 20 as soon as it is turned on.  Patient has had x 1 year. Patient has tried adjusting and pressure goes straight back to 20. DME [facility]  Please advise Dr Halford Chessman if you are okay with following this patient for sleep -- last seen by [facility] 09/22/2014, will need yearly OV  Please advise if okay to send order to [facility] to check patient's machine for functionality. Thanks.

## 2015-07-13 NOTE — Telephone Encounter (Signed)
Spoke with pt wife- aware that order placed to check machine.  Also aware that a recall has been entered for the patients care to transferred to Dr Halford Chessman for yearly follow up seeing that Dr Gwenette Greet is no longer here. Wife aware that they will receive a call back around October to scheduled appt with VS. Nothing further needed.

## 2015-07-18 ENCOUNTER — Other Ambulatory Visit: Payer: Self-pay | Admitting: Internal Medicine

## 2015-07-18 ENCOUNTER — Encounter: Payer: Self-pay | Admitting: Internal Medicine

## 2015-07-18 ENCOUNTER — Other Ambulatory Visit (INDEPENDENT_AMBULATORY_CARE_PROVIDER_SITE_OTHER): Payer: 59

## 2015-07-18 ENCOUNTER — Ambulatory Visit (INDEPENDENT_AMBULATORY_CARE_PROVIDER_SITE_OTHER): Payer: 59 | Admitting: Internal Medicine

## 2015-07-18 VITALS — BP 118/80 | HR 60 | Temp 97.6°F | Resp 16 | Wt 210.0 lb

## 2015-07-18 DIAGNOSIS — E1169 Type 2 diabetes mellitus with other specified complication: Secondary | ICD-10-CM | POA: Diagnosis not present

## 2015-07-18 DIAGNOSIS — R7309 Other abnormal glucose: Secondary | ICD-10-CM | POA: Diagnosis not present

## 2015-07-18 DIAGNOSIS — E785 Hyperlipidemia, unspecified: Secondary | ICD-10-CM | POA: Diagnosis not present

## 2015-07-18 DIAGNOSIS — Z23 Encounter for immunization: Secondary | ICD-10-CM | POA: Diagnosis not present

## 2015-07-18 DIAGNOSIS — I10 Essential (primary) hypertension: Secondary | ICD-10-CM | POA: Diagnosis not present

## 2015-07-18 DIAGNOSIS — R748 Abnormal levels of other serum enzymes: Secondary | ICD-10-CM | POA: Insufficient documentation

## 2015-07-18 DIAGNOSIS — E1121 Type 2 diabetes mellitus with diabetic nephropathy: Secondary | ICD-10-CM

## 2015-07-18 DIAGNOSIS — R7303 Prediabetes: Secondary | ICD-10-CM

## 2015-07-18 LAB — HEPATIC FUNCTION PANEL
ALBUMIN: 4.1 g/dL (ref 3.5–5.2)
ALT: 43 U/L (ref 0–53)
AST: 26 U/L (ref 0–37)
Alkaline Phosphatase: 87 U/L (ref 39–117)
BILIRUBIN DIRECT: 0.1 mg/dL (ref 0.0–0.3)
TOTAL PROTEIN: 7 g/dL (ref 6.0–8.3)
Total Bilirubin: 0.4 mg/dL (ref 0.2–1.2)

## 2015-07-18 LAB — BASIC METABOLIC PANEL
BUN: 20 mg/dL (ref 6–23)
CHLORIDE: 108 meq/L (ref 96–112)
CO2: 24 meq/L (ref 19–32)
Calcium: 9.4 mg/dL (ref 8.4–10.5)
Creatinine, Ser: 1.16 mg/dL (ref 0.40–1.50)
GFR: 67.11 mL/min (ref >60.00)
Glucose, Bld: 115 mg/dL — ABNORMAL HIGH (ref 70–99)
Potassium: 3.9 mEq/L (ref 3.5–5.1)
Sodium: 141 mEq/L (ref 135–145)

## 2015-07-18 LAB — LIPID PANEL
Cholesterol: 160 mg/dL (ref 0–200)
HDL: 40.7 mg/dL (ref >39.00)
LDL Cholesterol: 97 mg/dL (ref 0–99)
NonHDL: 119.3
Total CHOL/HDL Ratio: 4
Triglycerides: 111 mg/dL (ref 0.0–149.0)
VLDL: 22.2 mg/dL (ref 0.0–40.0)

## 2015-07-18 LAB — MICROALBUMIN / CREATININE URINE RATIO
CREATININE, U: 44.4 mg/dL
Microalb Creat Ratio: 1.6 mg/g (ref 0.0–30.0)
Microalb, Ur: <0.7 mg/dL (ref 0.0–1.9)

## 2015-07-18 LAB — TSH: TSH: 2.1 u[IU]/mL (ref 0.35–4.50)

## 2015-07-18 LAB — CK: CK TOTAL: 618 U/L — AB (ref 7–232)

## 2015-07-18 LAB — HEMOGLOBIN A1C: HEMOGLOBIN A1C: 5.8 % (ref 4.6–6.5)

## 2015-07-18 NOTE — Assessment & Plan Note (Signed)
Lipids, LFTs, TSH ,CK 

## 2015-07-18 NOTE — Assessment & Plan Note (Signed)
A1c , urine microalbumin, BMET 

## 2015-07-18 NOTE — Progress Notes (Signed)
Pre visit review using our clinic review tool, if applicable. No additional management support is needed unless otherwise documented below in the visit note. 

## 2015-07-18 NOTE — Patient Instructions (Addendum)
  Your next office appointment will be determined based upon review of your pending labs.  Those written interpretation of the lab results and instructions will be transmitted to you by My Chart  by mail for your records.  Critical results will be called.   Followup as needed for any active or acute issue. Please report any significant change in your symptoms.  Minimal Blood Pressure Goal= AVERAGE < 140/90;  Ideal is an AVERAGE < 135/85. This AVERAGE should be calculated from @ least 5-7 BP readings taken @ different times of day on different days of week. You should not respond to isolated BP readings , but rather the AVERAGE for that week .Please bring your  blood pressure cuff to office visits to verify that it is reliable.It  can also be checked against the blood pressure device at the pharmacy. Finger or wrist cuffs are not dependable; an arm cuff is.

## 2015-07-18 NOTE — Assessment & Plan Note (Signed)
Blood pressure goals reviewed. BMET 

## 2015-07-18 NOTE — Progress Notes (Signed)
   Subjective:    Patient ID: Garrett [name], male    DOB: [birth year], 64 y.o.   MRN: [id number]  HPI The patient is here to assess status of active health conditions.  He eats red meat and fried foods. He does restrict salt. He's working out at [employer] 30 minutes 3 times a week. He is also on rehabilitation. He denies any associated cardio pulmonary symptoms although he does have some muscle pain.  Blood pressure at home averages 110/70-75.  Fasting blood sugars range 100-130. Ophthalmologic exam is up-to-date. He does have blurred vision and is scheduled for cataract surgery next month. He's had no Podiatry evaluation. He denies any hypoglycemia. He does describe polydipsia. He has numbness in his feet as a chronic issue but it appears to be improved in the past year with decrease in his fasting sugars.  He is compliant with his CPAP but he states it "wakes me up".   Review of Systems  Chest pain, palpitations, tachycardia, exertional dyspnea, paroxysmal nocturnal dyspnea, claudication or edema are absent. No unexplained weight loss, abdominal pain, significant dyspepsia, dysphagia, melena, rectal bleeding, or persistently small caliber stools. Dysuria, pyuria, hematuria, frequency, nocturia or polyuria are denied. Change in hair, skin, nails denied. No bowel changes of constipation or diarrhea. No intolerance to heat or cold.No polyphagia.      Objective:   Physical Exam  Pertinent or positive findings include: Upper partial present. There is a fractured lower tooth on the right. Pupils are pinpoint. Rate is slow and heart sounds are distant. Dorsalis pedis pulses are decreased. Pes planus present. Crepitus of the knees noted. Decreased sensation of ventral toes.  General appearance :adequately nourished; in no distress.  Eyes: No conjunctival inflammation or scleral icterus is present.  Oral exam:  Lips and gums are healthy appearing.There is no oropharyngeal erythema or  exudate noted. Some tobacco debris.  Heart:   regular rhythm. S1 and S2 normal without gallop, murmur, click, rub or other extra sounds    Lungs:Chest clear to auscultation; no wheezes, rhonchi,rales ,or rubs present.No increased work of breathing.   Abdomen: bowel sounds normal, soft and non-tender without masses, organomegaly or hernias noted.  No guarding or rebound.   Vascular : all pulses equal ; no bruits present.  Skin:Warm & dry.  Intact without suspicious lesions or rashes ; no tenting or jaundice   Lymphatic: No lymphadenopathy is noted about the head, neck, axilla, or inguinal areas.   Neuro: Strength, tone & DTRs normal.        Assessment & Plan:  See Current Assessment & Plan in Problem List under specific Diagnosis

## 2015-07-20 LAB — NMR LIPOPROFILE WITH LIPIDS
Cholesterol, Total: 162 mg/dL (ref 100–199)
HDL Particle Number: 26.7 umol/L — ABNORMAL LOW (ref >=30.5)
HDL Size: 8.8 nm — ABNORMAL LOW (ref >=9.2)
HDL-C: 41 mg/dL (ref >39)
LARGE VLDL-P: 3.3 nmol/L — AB (ref <=2.7)
LDL CALC: 99 mg/dL (ref 0–99)
LDL Particle Number: 1036 nmol/L — ABNORMAL HIGH (ref <1000)
LDL Size: 20.9 nm (ref >=20.8)
LP-IR SCORE: 54 — AB (ref <=45)
Large HDL-P: 3.9 umol/L — ABNORMAL LOW (ref >=4.8)
SMALL LDL PARTICLE NUMBER: 396 nmol/L (ref <=527)
Triglycerides: 110 mg/dL (ref 0–149)
VLDL SIZE: 43.3 nm (ref <=46.6)

## 2015-07-21 ENCOUNTER — Ambulatory Visit: Payer: 59 | Admitting: Physical Therapy

## 2015-07-21 DIAGNOSIS — R293 Abnormal posture: Secondary | ICD-10-CM

## 2015-07-21 DIAGNOSIS — R29898 Other symptoms and signs involving the musculoskeletal system: Secondary | ICD-10-CM

## 2015-07-21 DIAGNOSIS — M545 Low back pain, unspecified: Secondary | ICD-10-CM

## 2015-07-21 DIAGNOSIS — Z9181 History of falling: Secondary | ICD-10-CM

## 2015-07-21 DIAGNOSIS — M2560 Stiffness of unspecified joint, not elsewhere classified: Secondary | ICD-10-CM
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
## 2015-07-25 ENCOUNTER — Ambulatory Visit: Payer: 59 | Attending: Neurosurgery | Admitting: Physical Therapy

## 2015-07-25 DIAGNOSIS — Z9181 History of falling: Secondary | ICD-10-CM

## 2015-07-25 DIAGNOSIS — M545 Low back pain, unspecified: Secondary | ICD-10-CM

## 2015-07-25 DIAGNOSIS — R29898 Other symptoms and signs involving the musculoskeletal system: Secondary | ICD-10-CM | POA: Diagnosis present

## 2015-07-25 DIAGNOSIS — R293 Abnormal posture: Secondary | ICD-10-CM

## 2015-07-25 DIAGNOSIS — M2560 Stiffness of unspecified joint, not elsewhere classified: Secondary | ICD-10-CM
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
## 2015-07-27 ENCOUNTER — Ambulatory Visit: Payer: 59 | Admitting: Physical Therapy

## 2015-07-27 DIAGNOSIS — Z9181 History of falling: Secondary | ICD-10-CM

## 2015-07-27 DIAGNOSIS — R29898 Other symptoms and signs involving the musculoskeletal system: Secondary | ICD-10-CM

## 2015-07-27 DIAGNOSIS — M545 Low back pain, unspecified: Secondary | ICD-10-CM

## 2015-07-27 DIAGNOSIS — R293 Abnormal posture: Secondary | ICD-10-CM

## 2015-07-27 DIAGNOSIS — M2560 Stiffness of unspecified joint, not elsewhere classified: Secondary | ICD-10-CM
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
## 2015-07-31 ENCOUNTER — Ambulatory Visit: Payer: 59 | Admitting: Physical Therapy

## 2015-07-31 DIAGNOSIS — R293 Abnormal posture: Secondary | ICD-10-CM

## 2015-07-31 DIAGNOSIS — Z9181 History of falling: Secondary | ICD-10-CM

## 2015-07-31 DIAGNOSIS — M545 Low back pain, unspecified: Secondary | ICD-10-CM

## 2015-07-31 DIAGNOSIS — M2560 Stiffness of unspecified joint, not elsewhere classified: Secondary | ICD-10-CM

## 2015-07-31 DIAGNOSIS — R29898 Other symptoms and signs involving the musculoskeletal system: Secondary | ICD-10-CM
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
## 2015-08-03 ENCOUNTER — Ambulatory Visit: Payer: 59 | Admitting: Physical Therapy

## 2015-08-21 ENCOUNTER — Encounter: Payer: Self-pay | Admitting: Gastroenterology

## 2015-08-23 ENCOUNTER — Ambulatory Visit: Payer: 59 | Admitting: Physical Therapy

## 2015-09-07 ENCOUNTER — Ambulatory Visit (AMBULATORY_SURGERY_CENTER): Payer: Self-pay

## 2015-09-07 VITALS — Ht 69.0 in | Wt 214.8 lb

## 2015-09-07 DIAGNOSIS — Z86010 Personal history of colonic polyps: Secondary | ICD-10-CM

## 2015-09-07 MED ORDER — MOVIPREP 100 G PO SOLR: ORAL | Status: DC

## 2015-09-07 NOTE — Progress Notes (Signed)
Per pt, no allergies to soy or egg products.Pt not taking any weight loss meds or using  O2 at home. 

## 2015-09-22 ENCOUNTER — Ambulatory Visit (INDEPENDENT_AMBULATORY_CARE_PROVIDER_SITE_OTHER): Payer: 59 | Admitting: Pulmonary Disease

## 2015-09-22 ENCOUNTER — Encounter: Payer: Self-pay | Admitting: Pulmonary Disease

## 2015-09-22 VITALS — BP 120/70 | HR 70 | Temp 98.1°F | Ht 69.0 in | Wt 219.2 lb

## 2015-09-22 DIAGNOSIS — G4733 Obstructive sleep apnea (adult) (pediatric): Secondary | ICD-10-CM

## 2015-09-22 DIAGNOSIS — Z23 Encounter for immunization: Secondary | ICD-10-CM | POA: Diagnosis not present

## 2015-09-22 NOTE — Patient Instructions (Signed)
Flu shot today Follow up in 1 year 

## 2015-09-22 NOTE — Progress Notes (Signed)
Chief Complaint  Patient presents with  . Follow-up    Former Garrett [name] pt. pt states he is doing pretty good. pt using CPAP every night for  about 5 hours. mask and pressure good for pt. no concerns at this time. no downlod available  pt would like flu vaccine. DME: [employer]    History of Present Illness: Garrett [name] is a 64 y.o. male with OSA.   He has been using CPAP.  He gets about 6 hrs sleep at night and uses CPAP.  He will sleep for few hrs after work in recliner >> not using CPAP then.  He has full face mask, and no issues with mask fit.  Feels rested during the day.  TESTS: HST 01/14/14 >> AHI 6  PMhx >> Asthma, Gout, DM, HA, HTN, GERD  Past surgical hx, Medications, Allergies, Family hx, Social hx all reviewed.   Physical Exam: BP 120/70 mmHg  Pulse 70  Temp(Src) 98.1 F (36.7 C) (Oral)  Ht 5\' 9"  (1.753 m)  Wt 219 lb 3.2 oz (99.428 kg)  BMI 32.36 kg/m2  SpO2 98%  General - No distress ENT - No sinus tenderness, no oral exudate, no LAN Cardiac - s1s2 regular, no murmur Chest - No wheeze/rales/dullness Back - No focal tenderness Abd - Soft, non-tender Ext - No edema Neuro - Normal strength Skin - No rashes Psych - normal mood, and behavior   Assessment/Plan:  Obstructive sleep apnea. He reports compliance with therapy and benefit from CPAP. Plan: - continue auto CPAP  Flu shot today   Chesley Mires, MD [facility] Pager:  [phone]

## 2015-09-26 ENCOUNTER — Ambulatory Visit: Payer: 59 | Admitting: Pulmonary Disease

## 2015-09-26 ENCOUNTER — Ambulatory Visit (AMBULATORY_SURGERY_CENTER): Payer: 59 | Admitting: Gastroenterology

## 2015-09-26 ENCOUNTER — Encounter: Payer: Self-pay | Admitting: Gastroenterology

## 2015-09-26 VITALS — BP 107/63 | HR 63 | Temp 97.7°F | Resp 16 | Ht 69.0 in | Wt 214.0 lb

## 2015-09-26 DIAGNOSIS — D120 Benign neoplasm of cecum: Secondary | ICD-10-CM

## 2015-09-26 DIAGNOSIS — D123 Benign neoplasm of transverse colon: Secondary | ICD-10-CM

## 2015-09-26 DIAGNOSIS — Z86010 Personal history of colonic polyps: Secondary | ICD-10-CM

## 2015-09-26 HISTORY — PX: COLONOSCOPY: SHX174

## 2015-09-26 MED ORDER — SODIUM CHLORIDE 0.9 % IV SOLN: 500.0000 mL | INTRAVENOUS | Status: DC

## 2015-09-26 NOTE — Patient Instructions (Signed)
YOU HAD AN ENDOSCOPIC PROCEDURE TODAY AT THE [facility]:   Refer to the procedure report that was given to you for any specific questions about what was found during the examination.  If the procedure report does not answer your questions, please call your gastroenterologist to clarify.  If you requested that your care partner not be given the details of your procedure findings, then the procedure report has been included in a sealed envelope for you to review at your convenience later.  YOU SHOULD EXPECT: Some feelings of bloating in the abdomen. Passage of more gas than usual.  Walking can help get rid of the air that was put into your GI tract during the procedure and reduce the bloating. If you had a lower endoscopy (such as a colonoscopy or flexible sigmoidoscopy) you may notice spotting of blood in your stool or on the toilet paper. If you underwent a bowel prep for your procedure, you may not have a normal bowel movement for a few days.  Please Note:  You might notice some irritation and congestion in your nose or some drainage.  This is from the oxygen used during your procedure.  There is no need for concern and it should clear up in a day or so.  SYMPTOMS TO REPORT IMMEDIATELY:   Following lower endoscopy (colonoscopy or flexible sigmoidoscopy):  Excessive amounts of blood in the stool  Significant tenderness or worsening of abdominal pains  Swelling of the abdomen that is new, acute  Fever of 100F or higher    For urgent or emergent issues, a gastroenterologist can be reached at any hour by calling [phone].   DIET: Your first meal following the procedure should be a small meal and then it is ok to progress to your normal diet. Heavy or fried foods are harder to digest and may make you feel nauseous or bloated.  Likewise, meals heavy in dairy and vegetables can increase bloating.  Drink plenty of fluids but you should avoid alcoholic beverages for 24  hours.  ACTIVITY:  You should plan to take it easy for the rest of today and you should NOT DRIVE or use heavy machinery until tomorrow (because of the sedation medicines used during the test).    FOLLOW UP: Our staff will call the number listed on your records the next business day following your procedure to check on you and address any questions or concerns that you may have regarding the information given to you following your procedure. If we do not reach you, we will leave a message.  However, if you are feeling well and you are not experiencing any problems, there is no need to return our call.  We will assume that you have returned to your regular daily activities without incident.  If any biopsies were taken you will be contacted by phone or by letter within the next 1-3 weeks.  Please call us at [phone] if you have not heard about the biopsies in 3 weeks.    SIGNATURES/CONFIDENTIALITY: You and/or your care partner have signed paperwork which will be entered into your electronic medical record.  These signatures attest to the fact that that the information above on your After Visit Summary has been reviewed and is understood.  Full responsibility of the confidentiality of this discharge information lies with you and/or your care-partner.   Resume medications. Information given on polyps,diverticulosis and high fiber diet. 

## 2015-09-26 NOTE — Op Note (Signed)
[place]  [address] [place], [address]   COLONOSCOPY PROCEDURE REPORT  PATIENT: Garrett [name], Garrett [name]  MR#: [id number] BIRTHDATE: 01/01/1951 , 20  yrs. old GENDER: male ENDOSCOPIST: Milus Banister, MD PROCEDURE DATE:  09/26/2015 PROCEDURE:   Colonoscopy, surveillance and Colonoscopy with snare polypectomy First Screening Colonoscopy - Avg.  risk and is 50 yrs.  old or older - No.  Prior Negative Screening - Now for repeat screening. N/A  History of Adenoma - Now for follow-up colonoscopy & has been > or = to 3 yrs.  Yes hx of adenoma.  Has been 3 or more years since last colonoscopy.  Polyps removed today? Yes ASA CLASS:   Class II INDICATIONS:Surveillance due to prior colonic neoplasia and 3 small TAs removed 2013. MEDICATIONS: Monitored anesthesia care and Propofol 150 mg IV  DESCRIPTION OF PROCEDURE:   After the risks benefits and alternatives of the procedure were thoroughly explained, informed consent was obtained.  The digital rectal exam revealed no abnormalities of the rectum.   The [id number]  endoscope was introduced through the anus and advanced to the cecum, which was identified by both the appendix and ileocecal valve. No adverse events experienced.   The quality of the prep was excellent.  The instrument was then slowly withdrawn as the colon was fully examined. Estimated blood loss is zero unless otherwise noted in this procedure report.  COLON FINDINGS: There was mild diverticulosis noted in the left colon.   Two sessile polyps ranging between 3-55mm in size were found in the transverse colon and at the cecum.  Polypectomies were performed with a cold snare.  The resection was complete, the polyp tissue was completely retrieved and sent to histology.   The examination was otherwise normal.  Retroflexed views revealed no abnormalities. The time to cecum = 2.2 Withdrawal time = 8.5   The scope was withdrawn and the procedure  completed. COMPLICATIONS: There were no immediate complications.  ENDOSCOPIC IMPRESSION: 1.   Mild diverticulosis was noted in the left colon 2.   Two sessile polyps ranging between 3-21mm in size were found in the transverse colon and at the cecum; polypectomies were performed with a cold snare 3.   The examination was otherwise normal  RECOMMENDATIONS: If the polyp(s) removed today are proven to be adenomatous (pre-cancerous) polyps, you will need a repeat colonoscopy in 5 years.   You will receive a letter within 1-2 weeks with the results of your biopsy as well as final recommendations.  Please call my office if you have not received a letter after 3 weeks.  eSigned:  Milus Banister, MD 09/26/2015 7:51 AM   cc: Gwendolyn Grant, MD

## 2015-09-26 NOTE — Progress Notes (Signed)
Report to PACU, RN, vss, BBS= Clear.  

## 2015-09-26 NOTE — Progress Notes (Signed)
Called to room to assist during endoscopic procedure.  Patient ID and intended procedure confirmed with present staff. Received instructions for my participation in the procedure from the performing physician.  

## 2015-09-27 ENCOUNTER — Telehealth: Payer: Self-pay | Admitting: *Deleted

## 2015-09-27 NOTE — Telephone Encounter (Signed)
  Follow up Call-  Call back number 09/26/2015  Post procedure Call Back phone  # [phone]  Permission to leave phone message Yes     Patient questions:  Do you have a fever, pain , or abdominal swelling? No. Pain Score  0 *  Have you tolerated food without any problems? Yes.    Have you been able to return to your normal activities? Yes.    Do you have any questions about your discharge instructions: Diet   No. Medications  No. Follow up visit  No.  Do you have questions or concerns about your Care? No.  Actions: * If pain score is 4 or above: No action needed, pain <4.  Patient states he is "lightheaded" but he states he is eating and drinking Gatorade this morning, so he feels better than yesterday. Encouraged patient to call us back if he does not improve.

## 2015-10-04 ENCOUNTER — Encounter: Payer: Self-pay | Admitting: Gastroenterology

## 2015-10-25 ENCOUNTER — Ambulatory Visit (INDEPENDENT_AMBULATORY_CARE_PROVIDER_SITE_OTHER): Payer: 59 | Admitting: Family

## 2015-10-25 ENCOUNTER — Encounter: Payer: Self-pay | Admitting: Family

## 2015-10-25 VITALS — BP 132/88 | HR 65 | Temp 97.6°F | Resp 22 | Ht 69.0 in | Wt 221.0 lb

## 2015-10-25 DIAGNOSIS — J069 Acute upper respiratory infection, unspecified: Secondary | ICD-10-CM | POA: Insufficient documentation

## 2015-10-25 MED ORDER — ALBUTEROL SULFATE HFA 108 (90 BASE) MCG/ACT IN AERS: 2.0000 | INHALATION_SPRAY | Freq: Four times a day (QID) | RESPIRATORY_TRACT | Status: DC | PRN

## 2015-10-25 MED ORDER — CEFUROXIME AXETIL 250 MG PO TABS: 250.0000 mg | ORAL_TABLET | Freq: Two times a day (BID) | ORAL | Status: DC

## 2015-10-25 NOTE — Patient Instructions (Signed)
Thank you for choosing [facility].  Summary/Instructions:  Your prescription(s) have been submitted to your pharmacy or been printed and provided for you. Please take as directed and contact our office if you believe you are having problem(s) with the medication(s) or have any questions.  If your symptoms worsen or fail to improve, please contact our office for further instruction, or in case of emergency go directly to the emergency room at the closest medical facility.    Upper Respiratory Infection, Adult Most upper respiratory infections (URIs) are a viral infection of the air passages leading to the lungs. A URI affects the nose, throat, and upper air passages. The most common type of URI is nasopharyngitis and is typically referred to as "the common cold." URIs run their course and usually go away on their own. Most of the time, a URI does not require medical attention, but sometimes a bacterial infection in the upper airways can follow a viral infection. This is called a secondary infection. Sinus and middle ear infections are common types of secondary upper respiratory infections. Bacterial pneumonia can also complicate a URI. A URI can worsen asthma and chronic obstructive pulmonary disease (COPD). Sometimes, these complications can require emergency medical care and may be life threatening.  CAUSES Almost all URIs are caused by viruses. A virus is a type of germ and can spread from one person to another.  RISKS FACTORS You may be at risk for a URI if:   You smoke.   You have chronic heart or lung disease.  You have a weakened defense (immune) system.   You are very young or very old.   You have nasal allergies or asthma.  You work in crowded or poorly ventilated areas.  You work in health care facilities or schools. SIGNS AND SYMPTOMS  Symptoms typically develop 2-3 days after you come in contact with a cold virus. Most viral URIs last 7-10 days. However, viral  URIs from the influenza virus (flu virus) can last 14-18 days and are typically more severe. Symptoms may include:   Runny or stuffy (congested) nose.   Sneezing.   Cough.   Sore throat.   Headache.   Fatigue.   Fever.   Loss of appetite.   Pain in your forehead, behind your eyes, and over your cheekbones (sinus pain).  Muscle aches.  DIAGNOSIS  Your health care provider may diagnose a URI by:  Physical exam.  Tests to check that your symptoms are not due to another condition such as:  Strep throat.  Sinusitis.  Pneumonia.  Asthma. TREATMENT  A URI goes away on its own with time. It cannot be cured with medicines, but medicines may be prescribed or recommended to relieve symptoms. Medicines may help:  Reduce your fever.  Reduce your cough.  Relieve nasal congestion. HOME CARE INSTRUCTIONS   Take medicines only as directed by your health care provider.   Gargle warm saltwater or take cough drops to comfort your throat as directed by your health care provider.  Use a warm mist humidifier or inhale steam from a shower to increase air moisture. This may make it easier to breathe.  Drink enough fluid to keep your urine clear or pale yellow.   Eat soups and other clear broths and maintain good nutrition.   Rest as needed.   Return to work when your temperature has returned to normal or as your health care provider advises. You may need to stay home longer to avoid infecting others.   You can also use a face mask and careful hand washing to prevent spread of the virus.  Increase the usage of your inhaler if you have asthma.   Do not use any tobacco products, including cigarettes, chewing tobacco, or electronic cigarettes. If you need help quitting, ask your health care provider. PREVENTION  The best way to protect yourself from getting a cold is to practice good hygiene.   Avoid oral or hand contact with people with cold symptoms.   Wash your  hands often if contact occurs.  There is no clear evidence that vitamin C, vitamin E, echinacea, or exercise reduces the chance of developing a cold. However, it is always recommended to get plenty of rest, exercise, and practice good nutrition.  SEEK MEDICAL CARE IF:   You are getting worse rather than better.   Your symptoms are not controlled by medicine.   You have chills.  You have worsening shortness of breath.  You have brown or red mucus.  You have yellow or brown nasal discharge.  You have pain in your face, especially when you bend forward.  You have a fever.  You have swollen neck glands.  You have pain while swallowing.  You have white areas in the back of your throat. SEEK IMMEDIATE MEDICAL CARE IF:   You have severe or persistent:  Headache.  Ear pain.  Sinus pain.  Chest pain.  You have chronic lung disease and any of the following:  Wheezing.  Prolonged cough.  Coughing up blood.  A change in your usual mucus.  You have a stiff neck.  You have changes in your:  Vision.  Hearing.  Thinking.  Mood. MAKE SURE YOU:   Understand these instructions.  Will watch your condition.  Will get help right away if you are not doing well or get worse.   This information is not intended to replace advice given to you by your health care provider. Make sure you discuss any questions you have with your health care provider.   Document Released: 06/04/2001 Document Revised: 04/25/2015 Document Reviewed: 03/16/2014 Elsevier Interactive Patient Education 2016 [employer]  

## 2015-10-25 NOTE — Assessment & Plan Note (Addendum)
Symptoms and exam consistent with acute upper respiratory infection, however given history of asthma and diabetes will treat more aggressively. Start Ceftin. Start albuterol as needed for shortness of breath. Continue over-the-counter medications as needed for symptom relief and supportive care. Follow-up if symptoms worsen or fail to improve.

## 2015-10-25 NOTE — Progress Notes (Signed)
Pre visit review using our clinic review tool, if applicable. No additional management support is needed unless otherwise documented below in the visit note. 

## 2015-10-25 NOTE — Progress Notes (Signed)
Subjective:    Patient ID: Garrett [name], male    DOB: [birth year], 64 y.o.   MRN: [id number]  Chief Complaint  Patient presents with  . Cough    SOB, little productive cough, sinus drainage, this started happening last night    HPI:  Garrett [name] is a 64 y.o. male who  has a past medical history of Asthma; Arthritis; Gout; Diabetes mellitus; Migraine headache without aura; Hypertension; Colon polyps; GERD (gastroesophageal reflux disease); Allergy; Memory loss, short term ('07); Cellulitis (2013); Sleep apnea; Skin cancer; Cataract; and HOH (hard of hearing). and presents today for an acute office visit.  This is a new problem. Associated symptoms of SOB, productive cough, and sinus drainage that started about a day. Modifying factors include sinus medication and using a his wife's QVAR which did help a little with his symptoms. Denies any recent antibiotic use.   Allergies  Allergen Reactions  . Bee Venom Anaphylaxis  . Garlic Swelling     Current Outpatient Prescriptions on File Prior to Visit  Medication Sig Dispense Refill  . allopurinol (ZYLOPRIM) 100 MG tablet Take 1 tablet (100 mg total) by mouth daily. 90 tablet 3  . aspirin 81 MG tablet Take 81 mg by mouth daily.      . celecoxib (CELEBREX) 200 MG capsule Take 1 capsule (200 mg total) by mouth 2 (two) times daily. 180 capsule 3  . CINNAMON PO Take 1 capsule by mouth daily.    . cyanocobalamin 500 MCG tablet Take 500 mcg by mouth daily.      . diazepam (VALIUM) 5 MG tablet Take 1-2 tablets (5-10 mg total) by mouth every 6 (six) hours as needed for muscle spasms. 30 tablet 0  . EPINEPHrine (EPIPEN) 0.3 mg/0.3 mL SOAJ injection Inject 0.3 mg into the muscle once.    . furosemide (LASIX) 20 MG tablet Take 1 tablet (20 mg total) by mouth daily. 90 tablet 3  . HYDROcodone-acetaminophen (NORCO/VICODIN) 5-325 MG per tablet Take 1 tablet by mouth 3 (three) times daily as needed for moderate pain. 60 tablet 0  . memantine  (NAMENDA) 10 MG tablet TAKE 1 TABLET BY MOUTH TWICE DAILY 180 tablet 3  . metoprolol (LOPRESSOR) 100 MG tablet Take 0.5 tablets (50 mg total) by mouth daily. 90 tablet 3  . mometasone (NASONEX) 50 MCG/ACT nasal spray Place 2 sprays into the nose daily. 17 g 12  . Multiple Vitamins-Minerals (ONE-A-DAY WEIGHT SMART ADVANCE PO) Take 1 tablet by mouth daily.     . pantoprazole (PROTONIX) 40 MG tablet Take 1 tablet (40 mg total) by mouth 2 (two) times daily. 180 tablet 3  . Potassium Gluconate 550 MG TABS Take 550 mg by mouth daily.     . pravastatin (PRAVACHOL) 20 MG tablet 1/2 qhs 90 tablet 2  . ramipril (ALTACE) 10 MG capsule Take 1 capsule (10 mg total) by mouth daily. 90 capsule 2  . terazosin (HYTRIN) 5 MG capsule Take 1 capsule (5 mg total) by mouth at bedtime. 90 capsule 2  . topiramate (TOPAMAX) 25 MG tablet Take 1 tablet (25 mg total) by mouth 3 (three) times daily. 270 tablet 3  . TRUETRACK TEST test strip   11  . verapamil (CALAN-SR) 240 MG CR tablet TAKE 1 TABLET BY MOUTH AT BEDTIME. 90 tablet 3   No current facility-administered medications on file prior to visit.     Past Surgical History  Procedure Laterality Date  . Vasectomy    . Myringotomy  several occasions '02-'03 for dizziness  . Strabismus surgery  1994    left eye  . Orif tibia & fibula fractures  1998    jumping off a wall  . Incision and drain  '03    staph infection right elbow - required open surgery  . Hiatal hernia repair      done three times: '82 and 04  . Eye surgery      muscle in left eye  . Anterior cervical decomp/discectomy fusion N/A 02/25/2014    Procedure: ANTERIOR CERVICAL DECOMPRESSION/DISCECTOMY FUSION 1 LEVEL five/six;  Surgeon: [name], MD;  Location: [facility];  Service: Neurosurgery;  Laterality: N/A;  . Lumbar laminectomy/decompression microdiscectomy Right 02/25/2014    Procedure: LUMBAR LAMINECTOMY/DECOMPRESSION MICRODISCECTOMY 1 LEVEL four/five;  Surgeon: [name], MD;   Location: [facility];  Service: Neurosurgery;  Laterality: Right;  . Cardiac catheterization  '94    radial artery approach; normal coronaries 1994 ([facility])  . Maximum access (mas)posterior lumbar interbody fusion (plif) 1 level N/A 11/14/2014    Procedure: Lumbar two-three Maximum Access Surgery Posterior Lumbar Interbody Fusion;  Surgeon: [name], MD;  Location: [facility];  Service: Neurosurgery;  Laterality: N/A;  . Colonoscopy with polypectomy  2013  . Cataract extraction      Bil/ 2 weeks ago    Review of Systems  Constitutional: Negative for fever and chills.  HENT: Positive for congestion and sinus pressure. Negative for ear pain.   Respiratory: Positive for cough and shortness of breath. Negative for chest tightness.   Neurological: Positive for headaches.      Objective:    BP 132/88 mmHg  Pulse 65  Temp(Src) 97.6 F (36.4 C) (Oral)  Resp 22  Ht 5\' 9"  (1.753 m)  Wt 221 lb (100.245 kg)  BMI 32.62 kg/m2  SpO2 99% Nursing note and vital signs reviewed.  Physical Exam  Constitutional: He is oriented to person, place, and time. He appears well-developed and well-nourished. No distress.  HENT:  Right Ear: Tympanic membrane, external ear and ear canal normal.  Left Ear: Hearing, tympanic membrane, external ear and ear canal normal.  Nose: Right sinus exhibits maxillary sinus tenderness. Right sinus exhibits no frontal sinus tenderness. Left sinus exhibits maxillary sinus tenderness. Left sinus exhibits no frontal sinus tenderness.  Mouth/Throat: Uvula is midline, oropharynx is clear and moist and mucous membranes are normal.  Cardiovascular: Normal rate, regular rhythm, normal heart sounds and intact distal pulses.   Pulmonary/Chest: Effort normal. He has wheezes.  Lymphadenopathy:    He has no cervical adenopathy.  Neurological: He is alert and oriented to person, place, and time.  Skin: Skin is warm and dry.  Psychiatric: He has a normal mood and affect. His  behavior is normal. Judgment and thought content normal.       Assessment & Plan:   Problem List Items Addressed This Visit      Respiratory   Acute upper respiratory infection - Primary    Symptoms and exam consistent with acute upper respiratory infection, however given history of asthma and diabetes will treat more aggressively. Start Ceftin. Start albuterol as needed for shortness of breath. Continue over-the-counter medications as needed for symptom relief and supportive care. Follow-up if symptoms worsen or fail to improve.      Relevant Medications   cefUROXime (CEFTIN) 250 MG tablet   albuterol (PROVENTIL HFA;VENTOLIN HFA) 108 (90 BASE) MCG/ACT inhaler

## 2015-12-22 ENCOUNTER — Other Ambulatory Visit: Payer: Self-pay | Admitting: Internal Medicine

## 2015-12-29 MED FILL — TERAZOSIN 5 MG CAPSULE: 5 | 90 days supply | Qty: 90 | Fill #0

## 2015-12-29 MED FILL — RAMIPRIL 10 MG CAPSULE: 10 | 90 days supply | Qty: 90 | Fill #0

## 2016-01-04 DIAGNOSIS — G4452 New daily persistent headache (NDPH): Secondary | ICD-10-CM | POA: Diagnosis not present

## 2016-01-04 DIAGNOSIS — M4712 Other spondylosis with myelopathy, cervical region: Secondary | ICD-10-CM | POA: Diagnosis not present

## 2016-01-04 DIAGNOSIS — R519 Headache, unspecified: Secondary | ICD-10-CM | POA: Insufficient documentation

## 2016-01-08 MED FILL — PANTOPRAZOLE SOD DR 40 MG T: 40 | 90 days supply | Qty: 180 | Fill #2

## 2016-01-08 MED FILL — MEMANTINE HCL 10 MG TABLET: 10 | 90 days supply | Qty: 180 | Fill #3

## 2016-01-08 MED FILL — CELECOXIB 200 MG CAPSULE: 200 | 90 days supply | Qty: 180 | Fill #2

## 2016-01-12 ENCOUNTER — Encounter: Payer: Self-pay | Admitting: Nurse Practitioner

## 2016-01-12 ENCOUNTER — Ambulatory Visit (INDEPENDENT_AMBULATORY_CARE_PROVIDER_SITE_OTHER): Payer: PPO | Admitting: Nurse Practitioner

## 2016-01-12 DIAGNOSIS — J069 Acute upper respiratory infection, unspecified: Secondary | ICD-10-CM | POA: Insufficient documentation

## 2016-01-12 DIAGNOSIS — M4712 Other spondylosis with myelopathy, cervical region: Secondary | ICD-10-CM | POA: Diagnosis not present

## 2016-01-12 MED ORDER — PREDNISONE 10 MG PO TABS: ORAL_TABLET | ORAL | Status: DC

## 2016-01-12 MED ORDER — AZITHROMYCIN 250 MG PO TABS: ORAL_TABLET | ORAL | Status: DC

## 2016-01-12 MED FILL — AZITHROMYCIN 250 MG TABLET: 250 | 5 days supply | Qty: 6 | Fill #0

## 2016-01-12 MED FILL — predniSONE 10 MG TABS: 10 | 6 days supply | Qty: 21 | Fill #0

## 2016-01-12 NOTE — Progress Notes (Signed)
Pre visit review using our clinic review tool, if applicable. No additional management support is needed unless otherwise documented below in the visit note. 

## 2016-01-12 NOTE — Progress Notes (Signed)
Patient ID: Garrett [name], male    DOB: 1951-06-28  Age: 65 y.o. MRN: [id number]  CC: Cough   HPI Garrett [name] presents for CC of coughing x 1 week.   1) Congestion, cough, SOB, Wheezing  Coughing-productive, green/brownish  Tmax- Subjective Sick contacts- [place] friends   Treatment to date:  Mucinex Allegra  Sudafed PE Albuterol inhaler- helpful   History Garrett [name] has a past medical history of Asthma; Arthritis; Gout; Diabetes mellitus; Migraine headache without aura; Hypertension; Colon polyps; GERD (gastroesophageal reflux disease); Allergy; Memory loss, short term ('07); Cellulitis (2013); Sleep apnea; Skin cancer; Cataract; and HOH (hard of hearing).   He has past surgical history that includes Vasectomy; Myringotomy; Strabismus surgery (1994); ORIF tibia & fibula fractures (1998); incision and drain ('03); Hiatal hernia repair; Eye surgery; Anterior cervical decomp/discectomy fusion (N/A, 02/25/2014); Lumbar laminectomy/decompression microdiscectomy (Right, 02/25/2014); Cardiac catheterization ('94); Maximum access (mas)posterior lumbar interbody fusion (plif) 1 level (N/A, 11/14/2014); colonoscopy with polypectomy (2013); and Cataract extraction.   His family history includes Dementia in his mother; Diabetes in his maternal grandmother; Heart attack in his maternal grandfather; Heart attack (age of onset: 14) in his paternal grandfather; Hypertension in his mother and sister; Stroke in his paternal grandfather. There is no history of Colon cancer or Stomach cancer.He reports that he quit smoking about 25 years ago. His smoking use included Cigarettes. He has a 90 pack-year smoking history. His smokeless tobacco use includes Snuff. He reports that he drinks about 0.6 - 1.2 oz of alcohol per week. He reports that he does not use illicit drugs.  Outpatient Prescriptions Prior to Visit  Medication Sig Dispense Refill  . albuterol (PROVENTIL HFA;VENTOLIN HFA) 108 (90 BASE) MCG/ACT  inhaler Inhale 2 puffs into the lungs every 6 (six) hours as needed for wheezing or shortness of breath. 1 Inhaler 0  . allopurinol (ZYLOPRIM) 100 MG tablet Take 1 tablet (100 mg total) by mouth daily. 90 tablet 3  . aspirin 81 MG tablet Take 81 mg by mouth daily.      . celecoxib (CELEBREX) 200 MG capsule Take 1 capsule (200 mg total) by mouth 2 (two) times daily. 180 capsule 3  . CINNAMON PO Take 1 capsule by mouth daily.    . cyanocobalamin 500 MCG tablet Take 500 mcg by mouth daily.      . diazepam (VALIUM) 5 MG tablet Take 1-2 tablets (5-10 mg total) by mouth every 6 (six) hours as needed for muscle spasms. 30 tablet 0  . EPINEPHrine (EPIPEN) 0.3 mg/0.3 mL SOAJ injection Inject 0.3 mg into the muscle once.    . furosemide (LASIX) 20 MG tablet Take 1 tablet (20 mg total) by mouth daily. 90 tablet 3  . HYDROcodone-acetaminophen (NORCO/VICODIN) 5-325 MG per tablet Take 1 tablet by mouth 3 (three) times daily as needed for moderate pain. 60 tablet 0  . memantine (NAMENDA) 10 MG tablet TAKE 1 TABLET BY MOUTH TWICE DAILY 180 tablet 3  . metoprolol (LOPRESSOR) 100 MG tablet Take 0.5 tablets (50 mg total) by mouth daily. 90 tablet 3  . mometasone (NASONEX) 50 MCG/ACT nasal spray Place 2 sprays into the nose daily. 17 g 12  . Multiple Vitamins-Minerals (ONE-A-DAY WEIGHT SMART ADVANCE PO) Take 1 tablet by mouth daily.     . pantoprazole (PROTONIX) 40 MG tablet Take 1 tablet (40 mg total) by mouth 2 (two) times daily. 180 tablet 3  . Potassium Gluconate 550 MG TABS Take 550 mg by mouth daily.     Marland Kitchen  pravastatin (PRAVACHOL) 20 MG tablet 1/2 qhs 90 tablet 2  . ramipril (ALTACE) 10 MG capsule TAKE 1 CAPSULE BY MOUTH ONCE DAILY 90 capsule 2  . terazosin (HYTRIN) 5 MG capsule TAKE 1 CAPSULE BY MOUTH ONCE AT BEDTIME 90 capsule 2  . topiramate (TOPAMAX) 25 MG tablet Take 1 tablet (25 mg total) by mouth 3 (three) times daily. 270 tablet 3  . TRUETRACK TEST test strip   11  . verapamil (CALAN-SR) 240 MG CR  tablet TAKE 1 TABLET BY MOUTH AT BEDTIME. 90 tablet 3  . cefUROXime (CEFTIN) 250 MG tablet Take 1 tablet (250 mg total) by mouth 2 (two) times daily with a meal. 20 tablet 0   No facility-administered medications prior to visit.    ROS Review of Systems  Constitutional: Positive for chills, diaphoresis and fatigue. Negative for fever.  HENT: Positive for congestion, postnasal drip, rhinorrhea, sinus pressure, sneezing and sore throat. Negative for ear pain, trouble swallowing and voice change.   Eyes: Negative for visual disturbance.  Respiratory: Positive for cough, shortness of breath and wheezing. Negative for chest tightness.   Cardiovascular: Negative for chest pain, palpitations and leg swelling.  Gastrointestinal: Negative for nausea, vomiting and diarrhea.  Endocrine: Negative for polydipsia, polyphagia and polyuria.  Skin: Negative for rash.  Neurological: Positive for headaches. Negative for dizziness.    Objective:  BP 112/60 mmHg  Pulse 64  Temp(Src) 97.9 F (36.6 C) (Oral)  Resp 20  Ht 5\' 9"  (1.753 m)  Wt 215 lb 12.8 oz (97.886 kg)  BMI 31.85 kg/m2  SpO2 97%  Physical Exam  Constitutional: He is oriented to person, place, and time. He appears well-developed and well-nourished. No distress.  HENT:  Head: Normocephalic and atraumatic.  Right Ear: External ear normal.  Left Ear: External ear normal.  Mouth/Throat: No oropharyngeal exudate.  Eyes: EOM are normal. Pupils are equal, round, and reactive to light. Right eye exhibits no discharge. Left eye exhibits no discharge. No scleral icterus.  Neck: Normal range of motion. Neck supple.  Cardiovascular: Normal rate, regular rhythm and normal heart sounds.  Exam reveals no gallop and no friction rub.   No murmur heard. Pulmonary/Chest: Effort normal and breath sounds normal. No respiratory distress. He has no wheezes. He has no rales. He exhibits no tenderness.  Decreased breath sounds equal in upper and lower lobes   Lymphadenopathy:    He has no cervical adenopathy.  Neurological: He is alert and oriented to person, place, and time.  Skin: Skin is warm and dry. No rash noted. He is not diaphoretic.  Psychiatric: He has a normal mood and affect. His behavior is normal. Judgment and thought content normal.   Assessment & Plan:   Nancy was seen today for cough.  Diagnoses and all orders for this visit:  Acute URI  Acute upper respiratory infection  Other orders -     azithromycin (ZITHROMAX) 250 MG tablet; Take 2 tablets by mouth on day 1, take 1 tablet by mouth each day after for 4 days. -     predniSONE (DELTASONE) 10 MG tablet; Take 6 tablets by mouth on day 1 then decrease by 1 tablet each day until gone.   I have discontinued Garrett [name]'s cefUROXime. I am also having him start on azithromycin and predniSONE. Additionally, I am having him maintain his aspirin, cyanocobalamin, Multiple Vitamins-Minerals (ONE-A-DAY WEIGHT SMART ADVANCE PO), Potassium Gluconate, CINNAMON PO, EPINEPHrine, diazepam, TRUETRACK TEST, allopurinol, metoprolol, HYDROcodone-acetaminophen, pravastatin, memantine, verapamil, celecoxib, pantoprazole, furosemide,  topiramate, mometasone, albuterol, terazosin, and ramipril.  Meds ordered this encounter  Medications  . azithromycin (ZITHROMAX) 250 MG tablet    Sig: Take 2 tablets by mouth on day 1, take 1 tablet by mouth each day after for 4 days.    Dispense:  6 each    Refill:  0    Order Specific Question:  Supervising Provider    Answer:  Deborra Medina L [[id number]]  . predniSONE (DELTASONE) 10 MG tablet    Sig: Take 6 tablets by mouth on day 1 then decrease by 1 tablet each day until gone.    Dispense:  21 tablet    Refill:  0    Order Specific Question:  Supervising Provider    Answer:  [name] [[id number]]     Follow-up: Return if symptoms worsen or fail to improve.

## 2016-01-12 NOTE — Patient Instructions (Signed)
Prednisone with breakfast or lunch at the latest.  6 tablets on day 1, 5 tablets on day 2, 4 tablets on day 3, 3 tablets on day 4, 2 tablets day 5, 1 tablet on day 6...done! Take tablets all together not spaced out Don't take with NSAIDs (Ibuprofen, Aleve, Naproxen, Meloxicam ect...)

## 2016-01-12 NOTE — Assessment & Plan Note (Signed)
New Onset Due to length of symptoms with worsening will treat empirically  Z-pack was sent to the pharmacy Encouraged Probiotics and inhaler use Continue OTC measures  Prednisone taper to decrease inflammation given  Instructions for taking done verbally and on AVS FU prn worsening/failure to improve.   Told to seek emergency care this weekend if worsens or unable to complete sentences. Pt verbalized understanding

## 2016-01-15 ENCOUNTER — Ambulatory Visit (HOSPITAL_BASED_OUTPATIENT_CLINIC_OR_DEPARTMENT_OTHER)
Admission: RE | Admit: 2016-01-15 | Discharge: 2016-01-15 | Disposition: A | Discharge status: Home / Self Care | Payer: PPO | Source: Ambulatory Visit | Attending: Family Medicine | Admitting: Family Medicine

## 2016-01-15 ENCOUNTER — Ambulatory Visit (INDEPENDENT_AMBULATORY_CARE_PROVIDER_SITE_OTHER): Payer: PPO | Admitting: Family Medicine

## 2016-01-15 ENCOUNTER — Encounter: Payer: Self-pay | Admitting: Family Medicine

## 2016-01-15 VITALS — BP 110/70 | HR 66 | Temp 98.0°F | Wt 217.2 lb

## 2016-01-15 DIAGNOSIS — J209 Acute bronchitis, unspecified: Secondary | ICD-10-CM | POA: Insufficient documentation

## 2016-01-15 DIAGNOSIS — Z87891 Personal history of nicotine dependence: Secondary | ICD-10-CM | POA: Insufficient documentation

## 2016-01-15 DIAGNOSIS — R05 Cough: Secondary | ICD-10-CM | POA: Diagnosis not present

## 2016-01-15 MED ORDER — PREDNISONE 10 MG PO TABS: ORAL_TABLET | ORAL | Status: DC

## 2016-01-15 MED ORDER — IPRATROPIUM-ALBUTEROL 0.5-2.5 (3) MG/3ML IN SOLN
3.0000 mL | Freq: Once | RESPIRATORY_TRACT | Status: AC
  Administered 2016-01-15: 3 mL via RESPIRATORY_TRACT

## 2016-01-15 MED ORDER — BUDESONIDE-FORMOTEROL FUMARATE 80-4.5 MCG/ACT IN AERO: 2.0000 | INHALATION_SPRAY | Freq: Two times a day (BID) | RESPIRATORY_TRACT | Status: DC

## 2016-01-15 MED FILL — predniSONE 10 MG TABS: 10 | 9 days supply | Qty: 18 | Fill #0

## 2016-01-15 MED FILL — VERAPAMIL ER 240 MG TABLET: 240 | 90 days supply | Qty: 90 | Fill #3

## 2016-01-15 MED FILL — SYMBICORT 80-4.5 MCG INH: 80-4.5 | 30 days supply | Qty: 10 | Fill #0
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
## 2016-01-15 NOTE — Patient Instructions (Signed)

## 2016-01-15 NOTE — Progress Notes (Signed)
Pre visit review using our clinic review tool, if applicable. No additional management support is needed unless otherwise documented below in the visit note. 

## 2016-01-16 ENCOUNTER — Telehealth: Payer: Self-pay | Admitting: Family Medicine

## 2016-01-16 NOTE — Telephone Encounter (Signed)
Fine with me

## 2016-01-16 NOTE — Telephone Encounter (Signed)
Pt is wanting to change PCP to Dr. Etter Sjogren from [facility]. Pt lives and works closer to [employer]. Is this ok?

## 2016-01-18 NOTE — Telephone Encounter (Signed)
[place] with me. I do not do long term pain medication.

## 2016-01-22 ENCOUNTER — Ambulatory Visit (INDEPENDENT_AMBULATORY_CARE_PROVIDER_SITE_OTHER): Payer: PPO | Admitting: Family Medicine

## 2016-01-22 ENCOUNTER — Encounter: Payer: Self-pay | Admitting: Family Medicine

## 2016-01-22 VITALS — BP 118/70 | HR 64 | Temp 98.1°F | Wt 221.0 lb

## 2016-01-22 DIAGNOSIS — J209 Acute bronchitis, unspecified: Secondary | ICD-10-CM | POA: Diagnosis not present

## 2016-01-22 NOTE — Assessment & Plan Note (Addendum)
meds finished Ok to use otc cough meds F/u prn
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
## 2016-01-22 NOTE — Patient Instructions (Signed)

## 2016-01-22 NOTE — Progress Notes (Signed)
Pre visit review using our clinic review tool, if applicable. No additional management support is needed unless otherwise documented below in the visit note. 

## 2016-01-24 DIAGNOSIS — Z135 Encounter for screening for eye and ear disorders: Secondary | ICD-10-CM | POA: Diagnosis not present

## 2016-01-24 DIAGNOSIS — R51 Headache: Secondary | ICD-10-CM | POA: Diagnosis not present

## 2016-01-24 DIAGNOSIS — G4452 New daily persistent headache (NDPH): Secondary | ICD-10-CM | POA: Diagnosis not present

## 2016-01-24 DIAGNOSIS — M4712 Other spondylosis with myelopathy, cervical region: Secondary | ICD-10-CM | POA: Diagnosis not present

## 2016-01-24 DIAGNOSIS — M47812 Spondylosis without myelopathy or radiculopathy, cervical region: Secondary | ICD-10-CM | POA: Diagnosis not present

## 2016-01-31 DIAGNOSIS — M4712 Other spondylosis with myelopathy, cervical region: Secondary | ICD-10-CM | POA: Diagnosis not present

## 2016-01-31 DIAGNOSIS — Z6832 Body mass index (BMI) 32.0-32.9, adult: Secondary | ICD-10-CM | POA: Diagnosis not present

## 2016-02-09 ENCOUNTER — Other Ambulatory Visit: Payer: Self-pay | Admitting: Family

## 2016-02-09 MED FILL — VENTOLIN HFA 90 MCG INHALER: 108 (90 BAS | 15 days supply | Qty: 18 | Fill #0

## 2016-02-26 ENCOUNTER — Encounter: Payer: 59 | Admitting: Family

## 2016-02-27 ENCOUNTER — Other Ambulatory Visit: Payer: Self-pay | Admitting: Neurosurgery

## 2016-02-27 DIAGNOSIS — M47817 Spondylosis without myelopathy or radiculopathy, lumbosacral region: Secondary | ICD-10-CM

## 2016-02-29 ENCOUNTER — Encounter: Payer: Self-pay | Admitting: Family Medicine

## 2016-02-29 ENCOUNTER — Ambulatory Visit (INDEPENDENT_AMBULATORY_CARE_PROVIDER_SITE_OTHER): Payer: PPO | Admitting: Family Medicine

## 2016-02-29 VITALS — BP 124/72 | HR 68 | Temp 98.8°F | Ht 69.0 in | Wt 225.0 lb

## 2016-02-29 DIAGNOSIS — K219 Gastro-esophageal reflux disease without esophagitis: Secondary | ICD-10-CM

## 2016-02-29 DIAGNOSIS — Z1159 Encounter for screening for other viral diseases: Secondary | ICD-10-CM

## 2016-02-29 DIAGNOSIS — Z0001 Encounter for general adult medical examination with abnormal findings: Secondary | ICD-10-CM | POA: Diagnosis not present

## 2016-02-29 DIAGNOSIS — I10 Essential (primary) hypertension: Secondary | ICD-10-CM | POA: Diagnosis not present

## 2016-02-29 DIAGNOSIS — J42 Unspecified chronic bronchitis: Secondary | ICD-10-CM | POA: Diagnosis not present

## 2016-02-29 DIAGNOSIS — E785 Hyperlipidemia, unspecified: Secondary | ICD-10-CM

## 2016-02-29 DIAGNOSIS — R509 Fever, unspecified: Secondary | ICD-10-CM | POA: Diagnosis not present

## 2016-02-29 DIAGNOSIS — Z0000 Encounter for general adult medical examination without abnormal findings: Secondary | ICD-10-CM | POA: Diagnosis not present

## 2016-02-29 DIAGNOSIS — Z114 Encounter for screening for human immunodeficiency virus [HIV]: Secondary | ICD-10-CM

## 2016-02-29 DIAGNOSIS — R059 Cough, unspecified: Secondary | ICD-10-CM

## 2016-02-29 DIAGNOSIS — IMO0002 Reserved for concepts with insufficient information to code with codable children: Secondary | ICD-10-CM

## 2016-02-29 DIAGNOSIS — N400 Enlarged prostate without lower urinary tract symptoms: Secondary | ICD-10-CM

## 2016-02-29 DIAGNOSIS — R413 Other amnesia: Secondary | ICD-10-CM

## 2016-02-29 DIAGNOSIS — E1151 Type 2 diabetes mellitus with diabetic peripheral angiopathy without gangrene: Secondary | ICD-10-CM

## 2016-02-29 DIAGNOSIS — R05 Cough: Secondary | ICD-10-CM

## 2016-02-29 DIAGNOSIS — Z87891 Personal history of nicotine dependence: Secondary | ICD-10-CM | POA: Diagnosis not present

## 2016-02-29 DIAGNOSIS — G43909 Migraine, unspecified, not intractable, without status migrainosus: Secondary | ICD-10-CM

## 2016-02-29 DIAGNOSIS — J302 Other seasonal allergic rhinitis: Secondary | ICD-10-CM

## 2016-02-29 DIAGNOSIS — M109 Gout, unspecified: Secondary | ICD-10-CM

## 2016-02-29 DIAGNOSIS — E1165 Type 2 diabetes mellitus with hyperglycemia: Secondary | ICD-10-CM | POA: Diagnosis not present

## 2016-02-29 LAB — COMPREHENSIVE METABOLIC PANEL
ALK PHOS: 83 U/L (ref 39–117)
ALT: 44 U/L (ref 0–53)
AST: 32 U/L (ref 0–37)
Albumin: 4.2 g/dL (ref 3.5–5.2)
BILIRUBIN TOTAL: 0.4 mg/dL (ref 0.2–1.2)
BUN: 13 mg/dL (ref 6–23)
CALCIUM: 9.2 mg/dL (ref 8.4–10.5)
CO2: 22 mEq/L (ref 19–32)
CREATININE: 1.18 mg/dL (ref 0.40–1.50)
Chloride: 110 mEq/L (ref 96–112)
GFR: 65.67 mL/min (ref >60.00)
Glucose, Bld: 95 mg/dL (ref 70–99)
Potassium: 3.4 mEq/L — ABNORMAL LOW (ref 3.5–5.1)
Sodium: 142 mEq/L (ref 135–145)
TOTAL PROTEIN: 7.1 g/dL (ref 6.0–8.3)

## 2016-02-29 LAB — CBC WITH DIFFERENTIAL/PLATELET
BASOS ABS: 0 10*3/uL (ref 0.0–0.1)
Basophils Relative: 0.6 % (ref 0.0–3.0)
EOS ABS: 0 10*3/uL (ref 0.0–0.7)
Eosinophils Relative: 0.6 % (ref 0.0–5.0)
HEMATOCRIT: 39.7 % (ref 39.0–52.0)
Hemoglobin: 13.4 g/dL (ref 13.0–17.0)
LYMPHS ABS: 1.4 10*3/uL (ref 0.7–4.0)
LYMPHS PCT: 20.7 % (ref 12.0–46.0)
MCHC: 33.8 g/dL (ref 30.0–36.0)
MCV: 91.7 fl (ref 78.0–100.0)
MONOS PCT: 15.3 % — AB (ref 3.0–12.0)
Monocytes Absolute: 1 10*3/uL (ref 0.1–1.0)
NEUTROS PCT: 62.8 % (ref 43.0–77.0)
Neutro Abs: 4.2 10*3/uL (ref 1.4–7.7)
Platelets: 192 10*3/uL (ref 150.0–400.0)
RBC: 4.33 Mil/uL (ref 4.22–5.81)
RDW: 14.5 % (ref 11.5–15.5)
WBC: 6.6 10*3/uL (ref 4.0–10.5)

## 2016-02-29 LAB — LIPID PANEL
Cholesterol: 150 mg/dL (ref 0–200)
HDL: 46.1 mg/dL (ref >39.00)
LDL Cholesterol: 68 mg/dL (ref 0–99)
NONHDL: 104.22
TRIGLYCERIDES: 180 mg/dL — AB (ref 0.0–149.0)
Total CHOL/HDL Ratio: 3
VLDL: 36 mg/dL (ref 0.0–40.0)

## 2016-02-29 LAB — POCT INFLUENZA A/B
Influenza A, POC: NEGATIVE
Influenza B, POC: NEGATIVE

## 2016-02-29 LAB — HEMOGLOBIN A1C: Hgb A1c MFr Bld: 6.2 % (ref 4.6–6.5)

## 2016-02-29 LAB — MICROALBUMIN / CREATININE URINE RATIO
CREATININE, U: 50.2 mg/dL
Microalb Creat Ratio: 1.4 mg/g (ref 0.0–30.0)
Microalb, Ur: <0.7 mg/dL (ref 0.0–1.9)

## 2016-02-29 LAB — PSA: PSA: 1.34 ng/mL (ref 0.10–4.00)

## 2016-02-29 MED ORDER — POTASSIUM GLUCONATE 550 MG PO TABS: 550.0000 mg | ORAL_TABLET | Freq: Every day | ORAL | Status: DC

## 2016-02-29 MED ORDER — MEMANTINE HCL 10 MG PO TABS: 10.0000 mg | ORAL_TABLET | Freq: Two times a day (BID) | ORAL | Status: DC

## 2016-02-29 MED ORDER — PRAVASTATIN SODIUM 20 MG PO TABS
ORAL_TABLET | ORAL | Status: DC
Start: 2016-02-29 — End: 2016-06-17

## 2016-02-29 MED ORDER — ALBUTEROL SULFATE HFA 108 (90 BASE) MCG/ACT IN AERS: INHALATION_SPRAY | RESPIRATORY_TRACT | Status: DC

## 2016-02-29 MED ORDER — METOPROLOL TARTRATE 100 MG PO TABS: 50.0000 mg | ORAL_TABLET | Freq: Every day | ORAL | Status: DC

## 2016-02-29 MED ORDER — VERAPAMIL HCL ER 240 MG PO TBCR: 240.0000 mg | EXTENDED_RELEASE_TABLET | Freq: Every day | ORAL | Status: DC

## 2016-02-29 MED ORDER — BUDESONIDE-FORMOTEROL FUMARATE 160-4.5 MCG/ACT IN AERO: 2.0000 | INHALATION_SPRAY | Freq: Two times a day (BID) | RESPIRATORY_TRACT | Status: DC

## 2016-02-29 MED ORDER — TOPIRAMATE 25 MG PO TABS: 25.0000 mg | ORAL_TABLET | Freq: Three times a day (TID) | ORAL | Status: DC

## 2016-02-29 MED ORDER — LEVOCETIRIZINE DIHYDROCHLORIDE 5 MG PO TABS: 5.0000 mg | ORAL_TABLET | Freq: Every evening | ORAL | Status: DC

## 2016-02-29 MED ORDER — MOMETASONE FUROATE 50 MCG/ACT NA SUSP: NASAL | Status: DC

## 2016-02-29 MED ORDER — PANTOPRAZOLE SODIUM 40 MG PO TBEC: 40.0000 mg | DELAYED_RELEASE_TABLET | Freq: Two times a day (BID) | ORAL | Status: DC

## 2016-02-29 MED ORDER — TERAZOSIN HCL 5 MG PO CAPS: ORAL_CAPSULE | ORAL | Status: DC

## 2016-02-29 MED ORDER — FUROSEMIDE 20 MG PO TABS: 20.0000 mg | ORAL_TABLET | Freq: Every day | ORAL | Status: DC

## 2016-02-29 MED ORDER — ALLOPURINOL 100 MG PO TABS: 100.0000 mg | ORAL_TABLET | Freq: Every day | ORAL | Status: DC

## 2016-02-29 MED ORDER — RAMIPRIL 10 MG PO CAPS: 10.0000 mg | ORAL_CAPSULE | Freq: Every day | ORAL | Status: DC

## 2016-02-29 MED FILL — LEVOCETIRIZINE 5 MG TABLET: 5 | 30 days supply | Qty: 30 | Fill #0 | Status: TO

## 2016-02-29 MED FILL — SYMBICORT 160-4.5 MCG INH: 160-4.5 | 30 days supply | Qty: 10 | Fill #0 | Status: TO
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
## 2016-02-29 NOTE — Progress Notes (Signed)
Pre visit review using our clinic review tool, if applicable. No additional management support is needed unless otherwise documented below in the visit note. 

## 2016-02-29 NOTE — Patient Instructions (Signed)
Preventive Care for Adults, Male A healthy lifestyle and preventive care can promote health and wellness. Preventive health guidelines for men include the following key practices:  A routine yearly physical is a good way to check with your health care provider about your health and preventative screening. It is a chance to share any concerns and updates on your health and to receive a thorough exam.  Visit your dentist for a routine exam and preventative care every 6 months. Brush your teeth twice a day and floss once a day. Good oral hygiene prevents tooth decay and gum disease.  The frequency of eye exams is based on your age, health, family medical history, use of contact lenses, and other factors. Follow your health care provider's recommendations for frequency of eye exams.  Eat a healthy diet. Foods such as vegetables, fruits, whole grains, low-fat dairy products, and lean protein foods contain the nutrients you need without too many calories. Decrease your intake of foods high in solid fats, added sugars, and salt. Eat the right amount of calories for you.Get information about a proper diet from your health care provider, if necessary.  Regular physical exercise is one of the most important things you can do for your health. Most adults should get at least 150 minutes of moderate-intensity exercise (any activity that increases your heart rate and causes you to sweat) each week. In addition, most adults need muscle-strengthening exercises on 2 or more days a week.  Maintain a healthy weight. The body mass index (BMI) is a screening tool to identify possible weight problems. It provides an estimate of body fat based on height and weight. Your health care provider can find your BMI and can help you achieve or maintain a healthy weight.For adults 20 years and older:  A BMI below 18.5 is considered underweight.  A BMI of 18.5 to 24.9 is normal.  A BMI of 25 to 29.9 is considered  overweight.  A BMI of 30 and above is considered obese.  Maintain normal blood lipids and cholesterol levels by exercising and minimizing your intake of saturated fat. Eat a balanced diet with plenty of fruit and vegetables. Blood tests for lipids and cholesterol should begin at age 9 and be repeated every 5 years. If your lipid or cholesterol levels are high, you are over 50, or you are at high risk for heart disease, you may need your cholesterol levels checked more frequently.Ongoing high lipid and cholesterol levels should be treated with medicines if diet and exercise are not working.  If you smoke, find out from your health care provider how to quit. If you do not use tobacco, do not start.  Lung cancer screening is recommended for adults aged 62-80 years who are at high risk for developing lung cancer because of a history of smoking. A yearly low-dose CT scan of the lungs is recommended for people who have at least a 30-pack-year history of smoking and are a current smoker or have quit within the past 15 years. A pack year of smoking is smoking an average of 1 pack of cigarettes a day for 1 year (for example: 1 pack a day for 30 years or 2 packs a day for 15 years). Yearly screening should continue until the smoker has stopped smoking for at least 15 years. Yearly screening should be stopped for people who develop a health problem that would prevent them from having lung cancer treatment.  If you choose to drink alcohol, do not have more  than 2 drinks per day. One drink is considered to be 12 ounces (355 mL) of beer, 5 ounces (148 mL) of wine, or 1.5 ounces (44 mL) of liquor.  Avoid use of street drugs. Do not share needles with anyone. Ask for help if you need support or instructions about stopping the use of drugs.  High blood pressure causes heart disease and increases the risk of stroke. Your blood pressure should be checked at least every 1-2 years. Ongoing high blood pressure should be  treated with medicines, if weight loss and exercise are not effective.  If you are 34-90 years old, ask your health care provider if you should take aspirin to prevent heart disease.  Diabetes screening is done by taking a blood sample to check your blood glucose level after you have not eaten for a certain period of time (fasting). If you are not overweight and you do not have risk factors for diabetes, you should be screened once every 3 years starting at age 35. If you are overweight or obese and you are 70-84 years of age, you should be screened for diabetes every year as part of your cardiovascular risk assessment.  Colorectal cancer can be detected and often prevented. Most routine colorectal cancer screening begins at the age of 18 and continues through age 69. However, your health care provider may recommend screening at an earlier age if you have risk factors for colon cancer. On a yearly basis, your health care provider may provide home test kits to check for hidden blood in the stool. Use of a small camera at the end of a tube to directly examine the colon (sigmoidoscopy or colonoscopy) can detect the earliest forms of colorectal cancer. Talk to your health care provider about this at age 71, when routine screening begins. Direct exam of the colon should be repeated every 5-10 years through age 18, unless early forms of precancerous polyps or small growths are found.  People who are at an increased risk for hepatitis B should be screened for this virus. You are considered at high risk for hepatitis B if:  You were born in a country where hepatitis B occurs often. Talk with your health care provider about which countries are considered high risk.  Your parents were born in a high-risk country and you have not received a shot to protect against hepatitis B (hepatitis B vaccine).  You have HIV or AIDS.  You use needles to inject street drugs.  You live with, or have sex with, someone who  has hepatitis B.  You are a man who has sex with other men (MSM).  You get hemodialysis treatment.  You take certain medicines for conditions such as cancer, organ transplantation, and autoimmune conditions.  Hepatitis C blood testing is recommended for all people born from [birth year] through 1965 and any individual with known risks for hepatitis C.  Practice safe sex. Use condoms and avoid high-risk sexual practices to reduce the spread of sexually transmitted infections (STIs). STIs include gonorrhea, chlamydia, syphilis, trichomonas, herpes, HPV, and human immunodeficiency virus (HIV). Herpes, HIV, and HPV are viral illnesses that have no cure. They can result in disability, cancer, and death.  If you are a man who has sex with other men, you should be screened at least once per year for:  HIV.  Urethral, rectal, and pharyngeal infection of gonorrhea, chlamydia, or both.  If you are at risk of being infected with HIV, it is recommended that you take a  prescription medicine daily to prevent HIV infection. This is called preexposure prophylaxis (PrEP). You are considered at risk if:  You are a man who has sex with other men (MSM) and have other risk factors.  You are a heterosexual man, are sexually active, and are at increased risk for HIV infection.  You take drugs by injection.  You are sexually active with a partner who has HIV.  Talk with your health care provider about whether you are at high risk of being infected with HIV. If you choose to begin PrEP, you should first be tested for HIV. You should then be tested every 3 months for as long as you are taking PrEP.  A one-time screening for abdominal aortic aneurysm (AAA) and surgical repair of large AAAs by ultrasound are recommended for men ages 44 to 66 years who are current or former smokers.  Healthy men should no longer receive prostate-specific antigen (PSA) blood tests as part of routine cancer screening. Talk with your health  care provider about prostate cancer screening.  Testicular cancer screening is not recommended for adult males who have no symptoms. Screening includes self-exam, a health care provider exam, and other screening tests. Consult with your health care provider about any symptoms you have or any concerns you have about testicular cancer.  Use sunscreen. Apply sunscreen liberally and repeatedly throughout the day. You should seek shade when your shadow is shorter than you. Protect yourself by wearing long sleeves, pants, a wide-brimmed hat, and sunglasses year round, whenever you are outdoors.  Once a month, do a whole-body skin exam, using a mirror to look at the skin on your back. Tell your health care provider about new moles, moles that have irregular borders, moles that are larger than a pencil eraser, or moles that have changed in shape or color.  Stay current with required vaccines (immunizations).  Influenza vaccine. All adults should be immunized every year.  Tetanus, diphtheria, and acellular pertussis (Td, Tdap) vaccine. An adult who has not previously received Tdap or who does not know his vaccine status should receive 1 dose of Tdap. This initial dose should be followed by tetanus and diphtheria toxoids (Td) booster doses every 10 years. Adults with an unknown or incomplete history of completing a 3-dose immunization series with Td-containing vaccines should begin or complete a primary immunization series including a Tdap dose. Adults should receive a Td booster every 10 years.  Varicella vaccine. An adult without evidence of immunity to varicella should receive 2 doses or a second dose if he has previously received 1 dose.  Human papillomavirus (HPV) vaccine. Males aged 11-21 years who have not received the vaccine previously should receive the 3-dose series. Males aged 22-26 years may be immunized. Immunization is recommended through the age of 23 years for any male who has sex with males  and did not get any or all doses earlier. Immunization is recommended for any person with an immunocompromised condition through the age of 72 years if he did not get any or all doses earlier. During the 3-dose series, the second dose should be obtained 4-8 weeks after the first dose. The third dose should be obtained 24 weeks after the first dose and 16 weeks after the second dose.  Zoster vaccine. One dose is recommended for adults aged 23 years or older unless certain conditions are present.  Measles, mumps, and rubella (MMR) vaccine. Adults born before [birth year] generally are considered immune to measles and mumps. Adults born in [birth year]  or later should have 1 or more doses of MMR vaccine unless there is a contraindication to the vaccine or there is laboratory evidence of immunity to each of the three diseases. A routine second dose of MMR vaccine should be obtained at least 28 days after the first dose for students attending postsecondary schools, health care workers, or international travelers. People who received inactivated measles vaccine or an unknown type of measles vaccine during 1963-1967 should receive 2 doses of MMR vaccine. People who received inactivated mumps vaccine or an unknown type of mumps vaccine before 1979 and are at high risk for mumps infection should consider immunization with 2 doses of MMR vaccine. Unvaccinated health care workers born before [birth year] who lack laboratory evidence of measles, mumps, or rubella immunity or laboratory confirmation of disease should consider measles and mumps immunization with 2 doses of MMR vaccine or rubella immunization with 1 dose of MMR vaccine.  Pneumococcal 13-valent conjugate (PCV13) vaccine. When indicated, a person who is uncertain of his immunization history and has no record of immunization should receive the PCV13 vaccine. All adults 20 years of age and older should receive this vaccine. An adult aged 23 years or older who has certain medical  conditions and has not been previously immunized should receive 1 dose of PCV13 vaccine. This PCV13 should be followed with a dose of pneumococcal polysaccharide (PPSV23) vaccine. Adults who are at high risk for pneumococcal disease should obtain the PPSV23 vaccine at least 8 weeks after the dose of PCV13 vaccine. Adults older than 66 years of age who have normal immune system function should obtain the PPSV23 vaccine dose at least 1 year after the dose of PCV13 vaccine.  Pneumococcal polysaccharide (PPSV23) vaccine. When PCV13 is also indicated, PCV13 should be obtained first. All adults aged 31 years and older should be immunized. An adult younger than age 69 years who has certain medical conditions should be immunized. Any person who resides in a nursing home or long-term care facility should be immunized. An adult smoker should be immunized. People with an immunocompromised condition and certain other conditions should receive both PCV13 and PPSV23 vaccines. People with human immunodeficiency virus (HIV) infection should be immunized as soon as possible after diagnosis. Immunization during chemotherapy or radiation therapy should be avoided. Routine use of PPSV23 vaccine is not recommended for American Indians, [place] Natives, or people younger than 65 years unless there are medical conditions that require PPSV23 vaccine. When indicated, people who have unknown immunization and have no record of immunization should receive PPSV23 vaccine. One-time revaccination 5 years after the first dose of PPSV23 is recommended for people aged 19-64 years who have chronic kidney failure, nephrotic syndrome, asplenia, or immunocompromised conditions. People who received 1-2 doses of PPSV23 before age 56 years should receive another dose of PPSV23 vaccine at age 60 years or later if at least 5 years have passed since the previous dose. Doses of PPSV23 are not needed for people immunized with PPSV23 at or after age 79  years.  Meningococcal vaccine. Adults with asplenia or persistent complement component deficiencies should receive 2 doses of quadrivalent meningococcal conjugate (MenACWY-D) vaccine. The doses should be obtained at least 2 months apart. Microbiologists working with certain meningococcal bacteria, [employer], people at risk during an outbreak, and people who travel to or live in countries with a high rate of meningitis should be immunized. A first-year college student up through age 22 years who is living in a residence hall should receive a  dose if he did not receive a dose on or after his 16th birthday. Adults who have certain high-risk conditions should receive one or more doses of vaccine.  Hepatitis A vaccine. Adults who wish to be protected from this disease, have chronic liver disease, work with hepatitis A-infected animals, work in hepatitis A research labs, or travel to or work in countries with a high rate of hepatitis A should be immunized. Adults who were previously unvaccinated and who anticipate close contact with an international adoptee during the first 60 days after arrival in the [place] from a country with a high rate of hepatitis A should be immunized.  Hepatitis B vaccine. Adults should be immunized if they wish to be protected from this disease, are under age 45 years and have diabetes, have chronic liver disease, have had more than one sex partner in the past 6 months, may be exposed to blood or other infectious body fluids, are household contacts or sex partners of hepatitis B positive people, are clients or workers in certain care facilities, or travel to or work in countries with a high rate of hepatitis B.  Haemophilus influenzae type b (Hib) vaccine. A previously unvaccinated person with asplenia or sickle cell disease or having a scheduled splenectomy should receive 1 dose of Hib vaccine. Regardless of previous immunization, a recipient of a hematopoietic stem cell  transplant should receive a 3-dose series 6-12 months after his successful transplant. Hib vaccine is not recommended for adults with HIV infection. Preventive Service / Frequency Ages 10 to 14  Blood pressure check.** / Every 3-5 years.  Lipid and cholesterol check.** / Every 5 years beginning at age 7.  Hepatitis C blood test.** / For any individual with known risks for hepatitis C.  Skin self-exam. / Monthly.  Influenza vaccine. / Every year.  Tetanus, diphtheria, and acellular pertussis (Tdap, Td) vaccine.** / Consult your health care provider. 1 dose of Td every 10 years.  Varicella vaccine.** / Consult your health care provider.  HPV vaccine. / 3 doses over 6 months, if 9 or younger.  Measles, mumps, rubella (MMR) vaccine.** / You need at least 1 dose of MMR if you were born in 1957 or later. You may also need a second dose.  Pneumococcal 13-valent conjugate (PCV13) vaccine.** / Consult your health care provider.  Pneumococcal polysaccharide (PPSV23) vaccine.** / 1 to 2 doses if you smoke cigarettes or if you have certain conditions.  Meningococcal vaccine.** / 1 dose if you are age 36 to 1 years and a Market researcher living in a residence hall, or have one of several medical conditions. You may also need additional booster doses.  Hepatitis A vaccine.** / Consult your health care provider.  Hepatitis B vaccine.** / Consult your health care provider.  Haemophilus influenzae type b (Hib) vaccine.** / Consult your health care provider. Ages 93 to 81  Blood pressure check.** / Every year.  Lipid and cholesterol check.** / Every 5 years beginning at age 61.  Lung cancer screening. / Every year if you are aged 90-80 years and have a 30-pack-year history of smoking and currently smoke or have quit within the past 15 years. Yearly screening is stopped once you have quit smoking for at least 15 years or develop a health problem that would prevent you from having  lung cancer treatment.  Fecal occult blood test (FOBT) of stool. / Every year beginning at age 85 and continuing until age 3. You may not have to do  this test if you get a colonoscopy every 10 years.  Flexible sigmoidoscopy** or colonoscopy.** / Every 5 years for a flexible sigmoidoscopy or every 10 years for a colonoscopy beginning at age 11 and continuing until age 55.  Hepatitis C blood test.** / For all people born from [birth year] through 1965 and any individual with known risks for hepatitis C.  Skin self-exam. / Monthly.  Influenza vaccine. / Every year.  Tetanus, diphtheria, and acellular pertussis (Tdap/Td) vaccine.** / Consult your health care provider. 1 dose of Td every 10 years.  Varicella vaccine.** / Consult your health care provider.  Zoster vaccine.** / 1 dose for adults aged 43 years or older.  Measles, mumps, rubella (MMR) vaccine.** / You need at least 1 dose of MMR if you were born in 1957 or later. You may also need a second dose.  Pneumococcal 13-valent conjugate (PCV13) vaccine.** / Consult your health care provider.  Pneumococcal polysaccharide (PPSV23) vaccine.** / 1 to 2 doses if you smoke cigarettes or if you have certain conditions.  Meningococcal vaccine.** / Consult your health care provider.  Hepatitis A vaccine.** / Consult your health care provider.  Hepatitis B vaccine.** / Consult your health care provider.  Haemophilus influenzae type b (Hib) vaccine.** / Consult your health care provider. Ages 63 and over  Blood pressure check.** / Every year.  Lipid and cholesterol check.**/ Every 5 years beginning at age 39.  Lung cancer screening. / Every year if you are aged 5-80 years and have a 30-pack-year history of smoking and currently smoke or have quit within the past 15 years. Yearly screening is stopped once you have quit smoking for at least 15 years or develop a health problem that would prevent you from having lung cancer treatment.  Fecal  occult blood test (FOBT) of stool. / Every year beginning at age 8 and continuing until age 60. You may not have to do this test if you get a colonoscopy every 10 years.  Flexible sigmoidoscopy** or colonoscopy.** / Every 5 years for a flexible sigmoidoscopy or every 10 years for a colonoscopy beginning at age 26 and continuing until age 70.  Hepatitis C blood test.** / For all people born from [birth year] through 1966 and any individual with known risks for hepatitis C.  Abdominal aortic aneurysm (AAA) screening.** / A one-time screening for ages 86 to 87 years who are current or former smokers.  Skin self-exam. / Monthly.  Influenza vaccine. / Every year.  Tetanus, diphtheria, and acellular pertussis (Tdap/Td) vaccine.** / 1 dose of Td every 10 years.  Varicella vaccine.** / Consult your health care provider.  Zoster vaccine.** / 1 dose for adults aged 15 years or older.  Pneumococcal 13-valent conjugate (PCV13) vaccine.** / 1 dose for all adults aged 22 years and older.  Pneumococcal polysaccharide (PPSV23) vaccine.** / 1 dose for all adults aged 7 years and older.  Meningococcal vaccine.** / Consult your health care provider.  Hepatitis A vaccine.** / Consult your health care provider.  Hepatitis B vaccine.** / Consult your health care provider.  Haemophilus influenzae type b (Hib) vaccine.** / Consult your health care provider. **Family history and personal history of risk and conditions may change your health care provider's recommendations.   This information is not intended to replace advice given to you by your health care provider. Make sure you discuss any questions you have with your health care provider.   Document Released: 02/04/2002 Document Revised: 12/30/2014 Document Reviewed: 05/06/2011 Elsevier Interactive Patient Education 2016  [employer].

## 2016-03-01 LAB — HEPATITIS C ANTIBODY: HCV AB: NEGATIVE

## 2016-03-01 LAB — HIV ANTIBODY (ROUTINE TESTING W REFLEX): HIV: NONREACTIVE

## 2016-03-04 ENCOUNTER — Telehealth: Payer: Self-pay | Admitting: Acute Care

## 2016-03-04 DIAGNOSIS — Z85828 Personal history of other malignant neoplasm of skin: Secondary | ICD-10-CM

## 2016-03-04 DIAGNOSIS — Z87891 Personal history of nicotine dependence: Secondary | ICD-10-CM

## 2016-03-04 DIAGNOSIS — Z801 Family history of malignant neoplasm of trachea, bronchus and lung: Secondary | ICD-10-CM

## 2016-03-04 MED FILL — MOMETASONE FUROATE 50 MCG S: 50 | 30 days supply | Qty: 17 | Fill #3

## 2016-03-06 ENCOUNTER — Ambulatory Visit (HOSPITAL_BASED_OUTPATIENT_CLINIC_OR_DEPARTMENT_OTHER)
Admission: RE | Admit: 2016-03-06 | Discharge: 2016-03-06 | Disposition: A | Discharge status: Home / Self Care | Payer: PPO | Source: Ambulatory Visit | Attending: Medical | Admitting: Medical

## 2016-03-06 ENCOUNTER — Ambulatory Visit (INDEPENDENT_AMBULATORY_CARE_PROVIDER_SITE_OTHER): Payer: PPO | Admitting: Medical

## 2016-03-06 ENCOUNTER — Encounter: Payer: Self-pay | Admitting: Medical

## 2016-03-06 ENCOUNTER — Telehealth: Payer: Self-pay | Admitting: Medical

## 2016-03-06 VITALS — BP 112/62 | HR 79 | Temp 97.9°F | Ht 69.0 in | Wt 229.0 lb

## 2016-03-06 DIAGNOSIS — R509 Fever, unspecified: Secondary | ICD-10-CM

## 2016-03-06 DIAGNOSIS — Z87891 Personal history of nicotine dependence: Secondary | ICD-10-CM | POA: Diagnosis not present

## 2016-03-06 DIAGNOSIS — R062 Wheezing: Secondary | ICD-10-CM | POA: Insufficient documentation

## 2016-03-06 DIAGNOSIS — J42 Unspecified chronic bronchitis: Secondary | ICD-10-CM

## 2016-03-06 DIAGNOSIS — R918 Other nonspecific abnormal finding of lung field: Secondary | ICD-10-CM | POA: Insufficient documentation

## 2016-03-06 DIAGNOSIS — R05 Cough: Secondary | ICD-10-CM | POA: Diagnosis not present

## 2016-03-06 DIAGNOSIS — R059 Cough, unspecified: Secondary | ICD-10-CM

## 2016-03-06 MED ORDER — CEFTRIAXONE SODIUM 1 G IJ SOLR
1.0000 g | Freq: Once | INTRAMUSCULAR | Status: AC
  Administered 2016-03-06: 1 g via INTRAMUSCULAR

## 2016-03-06 MED ORDER — BENZONATATE 100 MG PO CAPS: 100.0000 mg | ORAL_CAPSULE | Freq: Three times a day (TID) | ORAL | Status: DC | PRN

## 2016-03-06 MED ORDER — IPRATROPIUM-ALBUTEROL 0.5-2.5 (3) MG/3ML IN SOLN
3.0000 mL | Freq: Four times a day (QID) | RESPIRATORY_TRACT | Status: DC
  Administered 2016-03-06: 3 mL via RESPIRATORY_TRACT

## 2016-03-06 MED ORDER — IPRATROPIUM BROMIDE HFA 17 MCG/ACT IN AERS: 2.0000 | INHALATION_SPRAY | Freq: Four times a day (QID) | RESPIRATORY_TRACT | Status: DC | PRN

## 2016-03-06 MED ORDER — PREDNISONE 10 MG PO TABS: ORAL_TABLET | ORAL | Status: DC

## 2016-03-06 MED ORDER — LEVOFLOXACIN 500 MG PO TABS: 500.0000 mg | ORAL_TABLET | Freq: Every day | ORAL | Status: DC

## 2016-03-06 MED FILL — ATROVENT HFA INHALER: 17 | 25 days supply | Qty: 13 | Fill #0

## 2016-03-06 MED FILL — BENZONATATE 100 MG CAPSULE: 100 | 10 days supply | Qty: 30 | Fill #0

## 2016-03-06 MED FILL — predniSONE 10 MG TABS: 10 | 5 days supply | Qty: 15 | Fill #0

## 2016-03-06 NOTE — Patient Instructions (Addendum)
For bronchitis vs pneumonia we gave rocephin 1 gram im in office. I will follow cxr result today and decide on which oral antibiotic to give.  For wheezing we gave duoneb in office. Will add atrovent inhaler to your tx regimen. Also go ahead and rx taper dose prednisone.  For cough benzonatate.  Follow up in 5 days or as needed.   If worsens over weekend then ED evaluation.
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
## 2016-03-06 NOTE — Telephone Encounter (Signed)
I spoke with pt regarding Lung Cancer Screening Program and discussed questionnaire.  Unfortunately, pt does not qualify for the lung screening program because he quit smoking in 1995.  Smoking cessation must have occurred within the last 15 yrs to qualify for this program.  Dicussed qualifying criteria with pt and advised CT can still be ordered through PCP.  I encouraged pt to discuss further with PCP on having scan done d/t smoking hx, family hx of lung ca, and personal hx of skin ca.  Pt verbalized understanding and is in agreement with plan.  He is aware I will send communication to PCP regarding this as well.    Will sign off and send to Dr. Etter Sjogren as Juluis Rainier.

## 2016-03-06 NOTE — Telephone Encounter (Signed)
Will forward to [place] per JJ's request  Thanks

## 2016-03-06 NOTE — Telephone Encounter (Signed)
I notified pt of 2 areas suspicious for early pneumonia. So I called in levofloxin. Advised to use probiotics while on antibiotic

## 2016-03-06 NOTE — Progress Notes (Signed)
Pre visit review using our clinic review tool, if applicable. No additional management support is needed unless otherwise documented below in the visit note. 

## 2016-03-07 LAB — CBC WITH DIFFERENTIAL/PLATELET
BASOS ABS: 0 10*3/uL (ref 0.0–0.1)
Basophils Relative: 0.2 % (ref 0.0–3.0)
EOS PCT: 1.3 % (ref 0.0–5.0)
Eosinophils Absolute: 0.1 10*3/uL (ref 0.0–0.7)
HEMATOCRIT: 35.1 % — AB (ref 39.0–52.0)
HEMOGLOBIN: 11.8 g/dL — AB (ref 13.0–17.0)
LYMPHS PCT: 14.5 % (ref 12.0–46.0)
Lymphs Abs: 1.5 10*3/uL (ref 0.7–4.0)
MCHC: 33.7 g/dL (ref 30.0–36.0)
MCV: 91.2 fl (ref 78.0–100.0)
MONOS PCT: 6.4 % (ref 3.0–12.0)
Monocytes Absolute: 0.7 10*3/uL (ref 0.1–1.0)
Neutro Abs: 8.2 10*3/uL — ABNORMAL HIGH (ref 1.4–7.7)
Neutrophils Relative %: 77.6 % — ABNORMAL HIGH (ref 43.0–77.0)
Platelets: 164 10*3/uL (ref 150.0–400.0)
RBC: 3.85 Mil/uL — AB (ref 4.22–5.81)
RDW: 13.6 % (ref 11.5–15.5)
WBC: 10.5 10*3/uL (ref 4.0–10.5)

## 2016-03-07 MED FILL — levoFLOXacin 500 MG TABS: 500 | 10 days supply | Qty: 10 | Fill #0

## 2016-03-07 NOTE — Telephone Encounter (Signed)
We can order CT lung --low dose dx: smoking, hx skin ca and family hx lung cancer

## 2016-03-07 NOTE — Addendum Note (Signed)
Addended by: Ewing Schlein on: 03/07/2016 06:13 PM   Modules accepted: Orders

## 2016-03-07 NOTE — Telephone Encounter (Signed)
The order is in and the patient has been made aware.    KP

## 2016-03-08 NOTE — Progress Notes (Signed)
Quick Note:  Pt has seen results on MyChart and message also sent for patient to call back if any questions. ______ 

## 2016-03-11 ENCOUNTER — Ambulatory Visit (INDEPENDENT_AMBULATORY_CARE_PROVIDER_SITE_OTHER): Payer: PPO | Admitting: Medical

## 2016-03-11 ENCOUNTER — Encounter: Payer: Self-pay | Admitting: Medical

## 2016-03-11 ENCOUNTER — Ambulatory Visit
Admission: RE | Admit: 2016-03-11 | Discharge: 2016-03-11 | Disposition: A | Discharge status: Home / Self Care | Payer: PPO | Source: Ambulatory Visit | Attending: Neurosurgery | Admitting: Neurosurgery

## 2016-03-11 VITALS — BP 120/70 | HR 87 | Temp 98.1°F | Ht 69.0 in | Wt 222.4 lb

## 2016-03-11 DIAGNOSIS — R05 Cough: Secondary | ICD-10-CM | POA: Diagnosis not present

## 2016-03-11 DIAGNOSIS — R059 Cough, unspecified: Secondary | ICD-10-CM

## 2016-03-11 DIAGNOSIS — Z87891 Personal history of nicotine dependence: Secondary | ICD-10-CM | POA: Diagnosis not present

## 2016-03-11 DIAGNOSIS — J189 Pneumonia, unspecified organism: Secondary | ICD-10-CM

## 2016-03-11 DIAGNOSIS — R062 Wheezing: Secondary | ICD-10-CM

## 2016-03-11 DIAGNOSIS — M47817 Spondylosis without myelopathy or radiculopathy, lumbosacral region: Secondary | ICD-10-CM

## 2016-03-11 DIAGNOSIS — M545 Low back pain: Secondary | ICD-10-CM | POA: Diagnosis not present

## 2016-03-11 MED ORDER — IOHEXOL 180 MG/ML  SOLN
1.0000 mL | Freq: Once | INTRAMUSCULAR | Status: AC | PRN
  Administered 2016-03-11: 1 mL via EPIDURAL

## 2016-03-11 MED ORDER — METHYLPREDNISOLONE ACETATE 40 MG/ML INJ SUSP (RADIOLOG
120.0000 mg | Freq: Once | INTRAMUSCULAR | Status: AC
  Administered 2016-03-11: 120 mg via EPIDURAL

## 2016-03-11 NOTE — Progress Notes (Signed)
Pre visit review using our clinic review tool, if applicable. No additional management support is needed unless otherwise documented below in the visit note. 

## 2016-03-11 NOTE — Discharge Instructions (Signed)
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
## 2016-03-11 NOTE — Patient Instructions (Addendum)
For pneumonia continue the levofloxin. For cough continue benzonatate.  For wheezing continue your inhalers and go ahead and do remaining 4 days of prednisone.   Providing you area doing well repeat chest xray this coming Monday or sooner if feels worse.

## 2016-03-12 ENCOUNTER — Other Ambulatory Visit: Payer: Self-pay

## 2016-03-12 DIAGNOSIS — C4490 Unspecified malignant neoplasm of skin, unspecified: Secondary | ICD-10-CM

## 2016-03-12 DIAGNOSIS — Z801 Family history of malignant neoplasm of trachea, bronchus and lung: Secondary | ICD-10-CM

## 2016-03-12 DIAGNOSIS — Z87891 Personal history of nicotine dependence: Secondary | ICD-10-CM

## 2016-03-15 ENCOUNTER — Encounter: Payer: Self-pay | Admitting: Medical

## 2016-03-15 ENCOUNTER — Ambulatory Visit (HOSPITAL_BASED_OUTPATIENT_CLINIC_OR_DEPARTMENT_OTHER)
Admission: RE | Admit: 2016-03-15 | Discharge: 2016-03-15 | Disposition: A | Discharge status: Home / Self Care | Payer: PPO | Source: Ambulatory Visit | Attending: Medical | Admitting: Medical

## 2016-03-15 ENCOUNTER — Ambulatory Visit (HOSPITAL_BASED_OUTPATIENT_CLINIC_OR_DEPARTMENT_OTHER)
Admission: RE | Admit: 2016-03-15 | Discharge: 2016-03-15 | Disposition: A | Discharge status: Home / Self Care | Payer: PPO | Source: Ambulatory Visit | Attending: Family Medicine | Admitting: Family Medicine

## 2016-03-15 DIAGNOSIS — C4490 Unspecified malignant neoplasm of skin, unspecified: Secondary | ICD-10-CM

## 2016-03-15 DIAGNOSIS — K449 Diaphragmatic hernia without obstruction or gangrene: Secondary | ICD-10-CM | POA: Insufficient documentation

## 2016-03-15 DIAGNOSIS — J189 Pneumonia, unspecified organism: Secondary | ICD-10-CM

## 2016-03-15 DIAGNOSIS — R05 Cough: Secondary | ICD-10-CM | POA: Insufficient documentation

## 2016-03-15 DIAGNOSIS — I2510 Atherosclerotic heart disease of native coronary artery without angina pectoris: Secondary | ICD-10-CM | POA: Diagnosis not present

## 2016-03-15 DIAGNOSIS — R918 Other nonspecific abnormal finding of lung field: Secondary | ICD-10-CM | POA: Insufficient documentation

## 2016-03-15 DIAGNOSIS — I700 Atherosclerosis of aorta: Secondary | ICD-10-CM | POA: Insufficient documentation

## 2016-03-15 DIAGNOSIS — R059 Cough, unspecified: Secondary | ICD-10-CM

## 2016-03-15 DIAGNOSIS — Z801 Family history of malignant neoplasm of trachea, bronchus and lung: Secondary | ICD-10-CM | POA: Diagnosis not present

## 2016-03-15 DIAGNOSIS — R062 Wheezing: Secondary | ICD-10-CM | POA: Diagnosis not present

## 2016-03-15 DIAGNOSIS — Z87891 Personal history of nicotine dependence: Secondary | ICD-10-CM

## 2016-03-15 MED ORDER — PREDNISONE 10 MG PO TABS
ORAL_TABLET | ORAL | Status: DC
Start: 2016-03-15 — End: 2016-03-21

## 2016-03-15 MED ORDER — PREDNISONE 10 MG PO TABS: ORAL_TABLET | ORAL | Status: DC

## 2016-03-15 MED FILL — predniSONE 10 MG TABS: 10 | 5 days supply | Qty: 15 | Fill #0

## 2016-03-15 NOTE — Telephone Encounter (Signed)
rx prednisone sent to pt pharmacy. 

## 2016-03-21 ENCOUNTER — Encounter: Payer: Self-pay | Admitting: Internal Medicine

## 2016-03-21 ENCOUNTER — Ambulatory Visit (INDEPENDENT_AMBULATORY_CARE_PROVIDER_SITE_OTHER): Payer: PPO | Admitting: Internal Medicine

## 2016-03-21 VITALS — BP 112/68 | HR 85 | Ht 69.0 in | Wt 226.2 lb

## 2016-03-21 DIAGNOSIS — R918 Other nonspecific abnormal finding of lung field: Secondary | ICD-10-CM

## 2016-03-21 DIAGNOSIS — R058 Other specified cough: Secondary | ICD-10-CM

## 2016-03-21 DIAGNOSIS — R05 Cough: Secondary | ICD-10-CM

## 2016-03-21 DIAGNOSIS — I10 Essential (primary) hypertension: Secondary | ICD-10-CM | POA: Diagnosis not present

## 2016-03-21 MED ORDER — IRBESARTAN 150 MG PO TABS: 150.0000 mg | ORAL_TABLET | Freq: Every day | ORAL | Status: DC

## 2016-03-21 MED ORDER — PREDNISONE 10 MG PO TABS: ORAL_TABLET | ORAL | Status: DC

## 2016-03-21 MED FILL — predniSONE 10 MG TABS: 10 | 6 days supply | Qty: 14 | Fill #0

## 2016-03-21 MED FILL — IRBESARTAN 150 MG TABLET: 150 | 30 days supply | Qty: 30 | Fill #0

## 2016-03-21 NOTE — Patient Instructions (Addendum)
Stop Altace and start avapro  150 mg  One half daily - take a whole if blood pressure too high  Pantoprazole Take 30- 60 min before your first and last meals of the day   GERD (REFLUX)  is an extremely common cause of respiratory symptoms just like yours , many times with no obvious heartburn at all.    It can be treated with medication, but also with lifestyle changes including elevation of the head of your bed (ideally with 6 inch  bed blocks),  Smoking cessation, avoidance of late meals, excessive alcohol, and avoid fatty foods, chocolate, peppermint, colas, red wine, and acidic juices such as orange juice.  NO MINT OR MENTHOL PRODUCTS SO NO COUGH DROPS  USE SUGARLESS CANDY INSTEAD (Jolley ranchers or Stover's or Life Savers) or even ice chips will also do - the key is to swallow to prevent all throat clearing. NO OIL BASED VITAMINS - use powdered substitutes.  If get worse > Prednisone 10 mg take  4 each am x 2 days,   2 each am x 2 days,  1 each am x 2 days and stop    Please schedule a follow up office visit in 6 weeks, call sooner if needed

## 2016-03-21 NOTE — Progress Notes (Signed)
   Subjective:    Patient ID: Garrett [name], male    DOB: [birth year], 65 y.o.   MRN: [id number]  HPI    Review of Systems     Objective:   Physical Exam        Assessment & Plan:

## 2016-03-21 NOTE — Progress Notes (Signed)
Subjective:    Patient ID: Garrett [name], male    DOB: 02-17-1951,   MRN: [id number]  HPI  72 yowm quit smoking 1992 and exposed to dust in [place] until age of 39  no respiratory problems except for OSA until fall 2016 with recurrent coughing/ sob/ wheezing since then already rx with Symbicort/ saba so referred to pulmonary clinic 03/21/2016 by Dr Etter Sjogren with abn ct chest.   03/21/2016 1st [facility] office visit/ Delicia Berens  On maint symbicort 160  Chief Complaint  Patient presents with  . Pulmonary Consult    Referred by Dr. Etter Sjogren. Pt states he had PNA recently and has had cough and SOB for the past few months- esp worse over the past 6 wks. He gets SOB walking short distances such as to his mailbox. His cough is prod at times with yellow/green sputum.    prior to 6 m prior to OV  Back and forth mb fine/ no need for any inhalers but breathing never right since, best rx is prednisone no def resp to symbicort or saba   No obvious day to day or daytime variability or assoc  cp or chest tightness, subjective wheeze or overt sinus or hb symptoms. No unusual exp hx or h/o childhood pna/ asthma or knowledge of premature birth.  Sleeping ok without nocturnal  or early am exacerbation  of respiratory  c/o's or need for noct saba. Also denies any obvious fluctuation of symptoms with weather or environmental changes or other aggravating or alleviating factors except as outlined above   Current Medications, Allergies, Complete Past Medical History, Past Surgical History, Family History, and Social History were reviewed in [employer] record.  ROS  The following are not active complaints unless bolded sore throat, dysphagia, dental problems, itching, sneezing,  nasal congestion or excess/ purulent secretions, ear ache,   fever, chills, sweats, unintended wt loss, classically pleuritic or exertional cp, hemoptysis,  orthopnea pnd or leg swelling, presyncope, palpitations, abdominal  pain, anorexia, nausea, vomiting, diarrhea  or change in bowel or bladder habits, change in stools or urine, dysuria,hematuria,  rash, arthralgias, visual complaints, headache, numbness, weakness or ataxia or problems with walking or coordination,  change in mood/affect or memory.         Review of Systems     Objective:   Physical Exam  amb wm top dentures wheezing across the room   Wt Readings from Last 3 Encounters:  03/21/16 226 lb 3.2 oz (102.604 kg)  03/11/16 222 lb 6.4 oz (100.88 kg)  03/06/16 229 lb (103.874 kg)    Vital signs reviewed  HEENT: nl dentition, turbinates, and oropharynx. Nl external ear canals without cough reflex   NECK :  without JVD/Nodes/TM/ nl carotid upstrokes bilaterally   LUNGS: no acc muscle use,  Nl contour chest which is clear to A and P bilaterally without cough on insp or exp maneuvers   CV:  RRR  no s3 or murmur or increase in P2, no edema   ABD:  soft and nontender with nl inspiratory excursion in the supine position. No bruits or organomegaly, bowel sounds nl  MS:  Nl gait/ ext warm without deformities, calf tenderness, cyanosis or clubbing No obvious joint restrictions   SKIN: warm and dry without lesions    NEURO:  alert, approp, nl sensorium with  no motor deficits    I personally reviewed images and agree with radiology impression as follows:  CT Chest   02/16/16  Lungs/Pleura: There multiple small pulmonary nodules scattered throughout the lungs bilaterally, the largest of which is in the posterior aspect of the right lower lobe where there is a subpleural nodule (image 144 of series 3) which has a volume derived mean diameter of 6.8 mm.    Assessment & Plan:

## 2016-03-22 ENCOUNTER — Encounter: Payer: Self-pay | Admitting: Internal Medicine

## 2016-03-22 DIAGNOSIS — R05 Cough: Secondary | ICD-10-CM | POA: Insufficient documentation

## 2016-03-22 DIAGNOSIS — R058 Other specified cough: Secondary | ICD-10-CM | POA: Insufficient documentation

## 2016-03-22 DIAGNOSIS — R918 Other nonspecific abnormal finding of lung field: Secondary | ICD-10-CM | POA: Insufficient documentation

## 2016-03-22 NOTE — Assessment & Plan Note (Signed)
Although there are clearly abnormalities on CT scan, they should probably be considered "microscopic" since not obvious on plain cxr  And are probably all benign and related to coal dust exp growing up in [place].   In the setting of obvious "macroscopic" health issues,  I am very reluctatnt to embark on an invasive w/u at this point but will arrange consevative  follow up and in the meantime see what we can do to address the patient's subjective concerns.    Discussed in detail all the  indications, usual  risks and alternatives  relative to the benefits with patient who agrees to proceed with conservative f/u as outlined = CT chest 09/15/16

## 2016-03-22 NOTE — Assessment & Plan Note (Signed)
Classic Upper airway cough syndrome, so named because it's frequently impossible to sort out how much is  CR/sinusitis with freq throat clearing (which can be related to primary GERD)   vs  causing  secondary (" extra esophageal")  GERD from wide swings in gastric pressure that occur with throat clearing, often  promoting self use of mint and menthol lozenges that reduce the lower esophageal sphincter tone and exacerbate the problem further in a cyclical fashion.   These are the same pts (now being labeled as having "irritable larynx syndrome" by some cough centers) who not infrequently have a history of having failed to tolerate ace inhibitors,  dry powder inhalers or biphosphonates or report having atypical reflux symptoms that don't respond to standard doses of PPI , and are easily confused as having aecopd or asthma flares by even experienced allergists/ pulmonologists.   For now rec try off acei and on max rx for gerd and regroup in 6 weeks - may be able to shorten his list of pulmonary meds at that point

## 2016-03-22 NOTE — Assessment & Plan Note (Addendum)
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/2016 due to pseudoasthma  In the best review of chronic cough to date ( NEJM 2016 375 [id number]) ,  ACEi are now felt to cause cough in up to  20% of pts which is a 4 fold increase from previous reports and does not include the variety of non-specific complaints we see in pulmonary clinic in pts on ACEi but previously attributed to another dx like  Refractory Copd/asthma and  include PNDS, throat and chest congestion, "bronchitis", unexplained dyspnea and noct "strangling" sensations, and hoarseness, but also  atypical /refractory GERD symptoms like dysphagia and "bad heartburn"   The only way I know  to prove this is not an "ACEi Case" is a trial off ACEi x a minimum of 6 weeks then regroup.   Try avapro 150 mg one half daily and f/u in 6 weeks to see if can start weaning pulmonary meds as at baseline 6 m prior to OV  He was fine on none.

## 2016-03-25 MED FILL — TERAZOSIN 5 MG CAPSULE: 5 | 90 days supply | Qty: 90 | Fill #1

## 2016-03-26 MED FILL — TOPIRAMATE 25 MG TABLET: 25 | 90 days supply | Qty: 270 | Fill #3

## 2016-03-27 MED FILL — SYMBICORT 160-4.5 MCG INH: 160-4.5 | 30 days supply | Qty: 10 | Fill #0

## 2016-03-27 MED FILL — LEVOCETIRIZINE 5 MG TABLET: 5 | 30 days supply | Qty: 30 | Fill #0

## 2016-03-28 MED FILL — FUROSEMIDE 20 MG TABLET: 20 | 90 days supply | Qty: 90 | Fill #3

## 2016-03-28 MED FILL — AMOXICILLIN 500 MG CAPSULE: 500 | 7 days supply | Qty: 30 | Fill #0

## 2016-03-28 MED FILL — HYDROCODON-APAP 5-325: 5-325 | 2 days supply | Qty: 12 | Fill #0

## 2016-04-08 DIAGNOSIS — H40013 Open angle with borderline findings, low risk, bilateral: Secondary | ICD-10-CM | POA: Diagnosis not present

## 2016-04-08 DIAGNOSIS — H52223 Regular astigmatism, bilateral: Secondary | ICD-10-CM | POA: Diagnosis not present

## 2016-04-08 DIAGNOSIS — H5022 Vertical strabismus, left eye: Secondary | ICD-10-CM | POA: Diagnosis not present

## 2016-04-08 DIAGNOSIS — E119 Type 2 diabetes mellitus without complications: Secondary | ICD-10-CM | POA: Diagnosis not present

## 2016-04-08 DIAGNOSIS — H5213 Myopia, bilateral: Secondary | ICD-10-CM | POA: Diagnosis not present

## 2016-04-08 DIAGNOSIS — H26493 Other secondary cataract, bilateral: Secondary | ICD-10-CM | POA: Diagnosis not present

## 2016-04-08 DIAGNOSIS — H524 Presbyopia: Secondary | ICD-10-CM | POA: Diagnosis not present

## 2016-04-09 ENCOUNTER — Other Ambulatory Visit: Payer: Self-pay | Admitting: Internal Medicine

## 2016-04-09 MED FILL — PANTOPRAZOLE SOD DR 40 MG T: 40 | 90 days supply | Qty: 180 | Fill #3

## 2016-04-09 MED FILL — CELECOXIB 200 MG CAPSULE: 200 | 90 days supply | Qty: 180 | Fill #3

## 2016-04-10 MED FILL — ALLOPURINOL 100 MG TABLET: 100 | 90 days supply | Qty: 90 | Fill #0

## 2016-04-10 MED FILL — MEMANTINE HCL 10 MG TABLET: 10 | 90 days supply | Qty: 180 | Fill #0

## 2016-04-18 ENCOUNTER — Other Ambulatory Visit: Payer: Self-pay

## 2016-04-18 DIAGNOSIS — I10 Essential (primary) hypertension: Secondary | ICD-10-CM

## 2016-04-18 MED ORDER — VERAPAMIL HCL ER 240 MG PO TBCR: 240.0000 mg | EXTENDED_RELEASE_TABLET | Freq: Every day | ORAL | Status: DC

## 2016-04-18 MED FILL — IRBESARTAN 150 MG TABLET: 150 | 30 days supply | Qty: 30 | Fill #1

## 2016-04-18 MED FILL — VERAPAMIL ER 240 MG TABLET: 240 | 90 days supply | Qty: 90 | Fill #0

## 2016-04-19 MED FILL — LEVOCETIRIZINE 5 MG TABLET: 5 | 30 days supply | Qty: 30 | Fill #1

## 2016-05-02 ENCOUNTER — Ambulatory Visit (INDEPENDENT_AMBULATORY_CARE_PROVIDER_SITE_OTHER): Payer: PPO | Admitting: Internal Medicine

## 2016-05-02 ENCOUNTER — Encounter: Payer: Self-pay | Admitting: Internal Medicine

## 2016-05-02 VITALS — BP 110/68 | HR 75 | Ht 69.0 in | Wt 226.0 lb

## 2016-05-02 DIAGNOSIS — R05 Cough: Secondary | ICD-10-CM | POA: Diagnosis not present

## 2016-05-02 DIAGNOSIS — R918 Other nonspecific abnormal finding of lung field: Secondary | ICD-10-CM | POA: Diagnosis not present

## 2016-05-02 DIAGNOSIS — I10 Essential (primary) hypertension: Secondary | ICD-10-CM | POA: Diagnosis not present

## 2016-05-02 DIAGNOSIS — R058 Other specified cough: Secondary | ICD-10-CM

## 2016-05-02 MED ORDER — IRBESARTAN 150 MG PO TABS: 150.0000 mg | ORAL_TABLET | Freq: Every day | ORAL | Status: DC

## 2016-05-02 NOTE — Patient Instructions (Signed)
We will call you for follow up ct in Sept 2017   If you are satisfied with your treatment plan,  let your doctor know and he/she can either refill your medications or you can return here when your prescription runs out.     If in any way you are not 100% satisfied,  please tell us.  If 100% better, tell your friends!  Pulmonary follow up is as needed

## 2016-05-02 NOTE — Assessment & Plan Note (Signed)
Trial off acei 03/21/2016 > resolved 05/02/2016 > pulm f/u prn

## 2016-05-02 NOTE — Assessment & Plan Note (Signed)
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/2016 due to pseudoasthma  Although even in retrospect it may not be clear the ACEi contributed to the pt's symptoms,  Pt improved off them and adding them back at this point or in the future would risk confusion in interpretation of non-specific respiratory symptoms to which this patient is prone  ie  Better not to muddy the waters here.   Ok on avapro 150 mg one half daily > Follow up per Primary Care planned

## 2016-05-02 NOTE — Assessment & Plan Note (Addendum)
See CT chest 03/15/16 rec f/u ct 09/15/16 in tickle file   Discussed in detail all the  indications, usual  risks and alternatives  relative to the benefits with patient who agrees to proceed with conservative f/u with CT chest as above as per [facility] guidelines but most likely these are completely benign and unrelated to his cough  I had an extended final summary discussion with the patient reviewing all relevant studies completed to date and  lasting 15 to 20 minutes of a 25 minute visit on the following issues:     Each maintenance medication was reviewed in detail including most importantly the difference between maintenance and prns and under what circumstances the prns are to be triggered using an action plan format that is not reflected in the computer generated alphabetically organized AVS.    Please see instructions for details which were reviewed in writing and the patient given a copy highlighting the part that I personally wrote and discussed at today's ov.

## 2016-05-02 NOTE — Progress Notes (Signed)
Subjective:    Patient ID: Garrett [name], male    DOB: [birth year],   MRN: [id number]   Brief patient profile:  65yowm quit smoking 1992 and exposed to dust in [place] until age of 45  no respiratory problems except for OSA until fall 2016 with recurrent coughing/ sob/ wheezing since then already rx with Symbicort/ saba so referred to pulmonary clinic 03/21/2016 by Dr Etter Sjogren with abn ct chest.   History of Present Illness  03/21/2016 1st [facility] office visit/ Wert  On maint symbicort 160  Chief Complaint  Patient presents with  . Pulmonary Consult    Referred by Dr. Etter Sjogren. Pt states he had PNA recently and has had cough and SOB for the past few months- esp worse over the past 6 wks. He gets SOB walking short distances such as to his mailbox. His cough is prod at times with yellow/green sputum.    prior to 6 m prior to OV  Back and forth mb fine/ no need for any inhalers but breathing never right since, best rx is prednisone no def resp to symbicort or saba  rec Stop Altace and start avapro  150 mg  One half daily - take a whole if blood pressure too high Pantoprazole Take 30- 60 min before your first and last meals of the day  GERD diet  If get worse > Prednisone 10 mg take  4 each am x 2 days,   2 each am x 2 days,  1 each am x 2 days and stop     05/02/2016  f/u ov/Wert re:  prob acei uacs / mpns  Chief Complaint  Patient presents with  . Follow-up    Breathing is much improved- almost back to his normal baseline. He is no longer coughing. Has not had to use albuterol.    improved p 2 weeks off acei and now Not limited by breathing from desired activities    No obvious day to day or daytime variability to suggest any asthma or assoc  Cough or  cp or chest tightness, subjective wheeze or overt sinus or hb symptoms. No unusual exp hx or h/o childhood pna/ asthma or knowledge of premature birth.  Sleeping ok without nocturnal  or early am exacerbation  of respiratory  c/o's or  need for noct saba. Also denies any obvious fluctuation of symptoms with weather or environmental changes or other aggravating or alleviating factors except as outlined above   Current Medications, Allergies, Complete Past Medical History, Past Surgical History, Family History, and Social History were reviewed in [employer] record.  ROS  The following are not active complaints unless bolded sore throat, dysphagia, dental problems, itching, sneezing,  nasal congestion or excess/ purulent secretions, ear ache,   fever, chills, sweats, unintended wt loss, classically pleuritic or exertional cp, hemoptysis,  orthopnea pnd or leg swelling, presyncope, palpitations, abdominal pain, anorexia, nausea, vomiting, diarrhea  or change in bowel or bladder habits, change in stools or urine, dysuria,hematuria,  rash, arthralgias, visual complaints, headache, numbness, weakness or ataxia or problems with walking or coordination,  change in mood/affect or memory.              Objective:   Physical Exam  amb wm  nad   05/02/2016        226  03/21/16 226 lb 3.2 oz (102.604 kg)  03/11/16 222 lb 6.4 oz (100.88 kg)  03/06/16 229 lb (103.874 kg)    Vital signs  reviewed  HEENT: nl   turbinates, and oropharynx. Nl external ear canals without cough reflex - top dentures   NECK :  without JVD/Nodes/TM/ nl carotid upstrokes bilaterally   LUNGS: no acc muscle use,  Nl contour chest which is clear to A and P bilaterally without cough on insp or exp maneuvers   CV:  RRR  no s3 or murmur or increase in P2, no edema   ABD:  soft and nontender with nl inspiratory excursion in the supine position. No bruits or organomegaly, bowel sounds nl  MS:  Nl gait/ ext warm without deformities, calf tenderness, cyanosis or clubbing No obvious joint restrictions   SKIN: warm and dry without lesions    NEURO:  alert, approp, nl sensorium with  no motor deficits    I personally reviewed images and  agree with radiology impression as follows:  CT Chest   02/16/16   Lungs/Pleura: There multiple small pulmonary nodules scattered throughout the lungs bilaterally, the largest of which is in the posterior aspect of the right lower lobe where there is a subpleural nodule (image 144 of series 3) which has a volume derived mean diameter of 6.8 mm.    Assessment & Plan:

## 2016-05-13 ENCOUNTER — Encounter: Payer: Self-pay | Admitting: Family Medicine

## 2016-05-13 ENCOUNTER — Ambulatory Visit (INDEPENDENT_AMBULATORY_CARE_PROVIDER_SITE_OTHER): Payer: PPO | Admitting: Family Medicine

## 2016-05-13 VITALS — BP 128/72 | HR 82 | Temp 98.9°F | Ht 69.0 in | Wt 225.8 lb

## 2016-05-13 DIAGNOSIS — R05 Cough: Secondary | ICD-10-CM

## 2016-05-13 DIAGNOSIS — R059 Cough, unspecified: Secondary | ICD-10-CM

## 2016-05-13 DIAGNOSIS — M1000 Idiopathic gout, unspecified site: Secondary | ICD-10-CM

## 2016-05-13 DIAGNOSIS — K219 Gastro-esophageal reflux disease without esophagitis: Secondary | ICD-10-CM | POA: Diagnosis not present

## 2016-05-13 DIAGNOSIS — N400 Enlarged prostate without lower urinary tract symptoms: Secondary | ICD-10-CM | POA: Diagnosis not present

## 2016-05-13 DIAGNOSIS — E785 Hyperlipidemia, unspecified: Secondary | ICD-10-CM

## 2016-05-13 DIAGNOSIS — I10 Essential (primary) hypertension: Secondary | ICD-10-CM

## 2016-05-13 DIAGNOSIS — F411 Generalized anxiety disorder: Secondary | ICD-10-CM

## 2016-05-13 DIAGNOSIS — R413 Other amnesia: Secondary | ICD-10-CM

## 2016-05-13 MED ORDER — IRBESARTAN 150 MG PO TABS: 150.0000 mg | ORAL_TABLET | Freq: Every day | ORAL | Status: DC

## 2016-05-13 MED ORDER — FUROSEMIDE 20 MG PO TABS: 20.0000 mg | ORAL_TABLET | Freq: Every day | ORAL | Status: DC

## 2016-05-13 MED ORDER — POTASSIUM GLUCONATE 550 MG PO TABS: 550.0000 mg | ORAL_TABLET | Freq: Every day | ORAL | Status: DC

## 2016-05-13 MED ORDER — PANTOPRAZOLE SODIUM 40 MG PO TBEC: 40.0000 mg | DELAYED_RELEASE_TABLET | Freq: Two times a day (BID) | ORAL | Status: DC

## 2016-05-13 MED ORDER — LEVOCETIRIZINE DIHYDROCHLORIDE 5 MG PO TABS: 5.0000 mg | ORAL_TABLET | Freq: Every evening | ORAL | Status: DC

## 2016-05-13 MED ORDER — MEMANTINE HCL 10 MG PO TABS: 10.0000 mg | ORAL_TABLET | Freq: Two times a day (BID) | ORAL | Status: DC

## 2016-05-13 MED ORDER — METOPROLOL TARTRATE 100 MG PO TABS
50.0000 mg | ORAL_TABLET | Freq: Every day | ORAL | Status: DC
Start: 2016-05-13 — End: 2016-06-17

## 2016-05-13 MED ORDER — VERAPAMIL HCL ER 240 MG PO TBCR: 240.0000 mg | EXTENDED_RELEASE_TABLET | Freq: Every day | ORAL | Status: DC

## 2016-05-13 MED ORDER — ALLOPURINOL 100 MG PO TABS: 100.0000 mg | ORAL_TABLET | Freq: Every day | ORAL | Status: DC

## 2016-05-13 MED ORDER — DIAZEPAM 5 MG PO TABS: 5.0000 mg | ORAL_TABLET | Freq: Four times a day (QID) | ORAL | Status: DC | PRN

## 2016-05-13 MED ORDER — TERAZOSIN HCL 5 MG PO CAPS: ORAL_CAPSULE | ORAL | Status: DC

## 2016-05-13 NOTE — Patient Instructions (Signed)

## 2016-05-13 NOTE — Progress Notes (Signed)
Pre visit review using our clinic review tool, if applicable. No additional management support is needed unless otherwise documented below in the visit note. 
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
## 2016-05-13 NOTE — Assessment & Plan Note (Addendum)
stable °

## 2016-05-23 MED FILL — HYDROCODON-APAP 5-325: 5-325 | 8 days supply | Qty: 90 | Fill #0

## 2016-06-12 ENCOUNTER — Other Ambulatory Visit (INDEPENDENT_AMBULATORY_CARE_PROVIDER_SITE_OTHER): Payer: PPO

## 2016-06-12 DIAGNOSIS — E785 Hyperlipidemia, unspecified: Secondary | ICD-10-CM

## 2016-06-12 LAB — COMPREHENSIVE METABOLIC PANEL
ALT: 47 U/L (ref 0–53)
AST: 26 U/L (ref 0–37)
Albumin: 4.2 g/dL (ref 3.5–5.2)
Alkaline Phosphatase: 84 U/L (ref 39–117)
BUN: 22 mg/dL (ref 6–23)
CHLORIDE: 111 meq/L (ref 96–112)
CO2: 23 meq/L (ref 19–32)
CREATININE: 0.99 mg/dL (ref 0.40–1.50)
Calcium: 9.4 mg/dL (ref 8.4–10.5)
GFR: 80.35 mL/min (ref >60.00)
GLUCOSE: 119 mg/dL — AB (ref 70–99)
Potassium: 3.5 mEq/L (ref 3.5–5.1)
SODIUM: 142 meq/L (ref 135–145)
Total Bilirubin: 0.4 mg/dL (ref 0.2–1.2)
Total Protein: 6.9 g/dL (ref 6.0–8.3)

## 2016-06-12 LAB — LIPID PANEL
CHOL/HDL RATIO: 4
Cholesterol: 169 mg/dL (ref 0–200)
HDL: 38.3 mg/dL — ABNORMAL LOW (ref >39.00)
LDL CALC: 108 mg/dL — AB (ref 0–99)
NonHDL: 130.27
Triglycerides: 111 mg/dL (ref 0.0–149.0)
VLDL: 22.2 mg/dL (ref 0.0–40.0)

## 2016-06-17 ENCOUNTER — Encounter: Payer: Self-pay | Admitting: Family Medicine

## 2016-06-17 ENCOUNTER — Ambulatory Visit (INDEPENDENT_AMBULATORY_CARE_PROVIDER_SITE_OTHER): Payer: PPO | Admitting: Family Medicine

## 2016-06-17 VITALS — BP 145/87 | HR 63 | Temp 98.6°F | Ht 69.0 in | Wt 223.8 lb

## 2016-06-17 DIAGNOSIS — E785 Hyperlipidemia, unspecified: Secondary | ICD-10-CM | POA: Diagnosis not present

## 2016-06-17 DIAGNOSIS — E1121 Type 2 diabetes mellitus with diabetic nephropathy: Secondary | ICD-10-CM

## 2016-06-17 DIAGNOSIS — E1151 Type 2 diabetes mellitus with diabetic peripheral angiopathy without gangrene: Secondary | ICD-10-CM

## 2016-06-17 DIAGNOSIS — E1169 Type 2 diabetes mellitus with other specified complication: Secondary | ICD-10-CM

## 2016-06-17 DIAGNOSIS — N400 Enlarged prostate without lower urinary tract symptoms: Secondary | ICD-10-CM

## 2016-06-17 DIAGNOSIS — I10 Essential (primary) hypertension: Secondary | ICD-10-CM

## 2016-06-17 MED ORDER — METOPROLOL TARTRATE 100 MG PO TABS: 50.0000 mg | ORAL_TABLET | Freq: Every day | ORAL | Status: DC

## 2016-06-17 MED ORDER — FUROSEMIDE 20 MG PO TABS: 20.0000 mg | ORAL_TABLET | Freq: Every day | ORAL | Status: DC

## 2016-06-17 MED ORDER — PRAVASTATIN SODIUM 10 MG PO TABS: 10.0000 mg | ORAL_TABLET | Freq: Every day | ORAL | Status: DC

## 2016-06-17 MED ORDER — VERAPAMIL HCL ER 240 MG PO TBCR
240.0000 mg | EXTENDED_RELEASE_TABLET | Freq: Every day | ORAL | Status: DC
Start: 2016-06-17 — End: 2016-09-19

## 2016-06-17 MED ORDER — IRBESARTAN 150 MG PO TABS: 150.0000 mg | ORAL_TABLET | Freq: Every day | ORAL | Status: DC

## 2016-06-17 MED ORDER — TERAZOSIN HCL 5 MG PO CAPS: ORAL_CAPSULE | ORAL | Status: DC

## 2016-06-17 MED FILL — PRAVASTATIN NA 10 MG TAB: 10 | 90 days supply | Qty: 90 | Fill #0

## 2016-06-17 NOTE — Assessment & Plan Note (Signed)
Stable con't meds 

## 2016-06-17 NOTE — Patient Instructions (Signed)

## 2016-06-17 NOTE — Progress Notes (Signed)
Pre visit review using our clinic review tool, if applicable. No additional management support is needed unless otherwise documented below in the visit note. 

## 2016-06-17 NOTE — Assessment & Plan Note (Signed)
Lab Results  Component Value Date   CHOL 169 06/12/2016   HDL 38.30* 06/12/2016   LDLCALC 108* 06/12/2016   LDLDIRECT 115.4 07/01/2013   TRIG 111.0 06/12/2016   CHOLHDL 4 06/12/2016   Restart pravachol 10 mg and recheck 3 months
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
## 2016-06-17 NOTE — Assessment & Plan Note (Signed)
Diet controlled Check labs during cpe Lab Results  Component Value Date   HGBA1C 6.2 02/29/2016

## 2016-06-23 MED FILL — IRBESARTAN 150 MG TABLET: 150 | 30 days supply | Qty: 30 | Fill #2

## 2016-06-23 MED FILL — TERAZOSIN 5 MG CAPSULE: 5 | 90 days supply | Qty: 90 | Fill #2

## 2016-06-24 ENCOUNTER — Other Ambulatory Visit: Payer: Self-pay | Admitting: Internal Medicine

## 2016-06-24 MED FILL — FUROSEMIDE 20 MG TABLET: 20 | 90 days supply | Qty: 90 | Fill #0

## 2016-06-24 MED FILL — TOPIRAMATE 25 MG TABLET: 25 | 90 days supply | Qty: 270 | Fill #0

## 2016-07-01 MED FILL — CELECOXIB 200 MG CAPSULE: 200 | 90 days supply | Qty: 180 | Fill #0

## 2016-07-01 MED FILL — PANTOPRAZOLE SOD DR 40 MG T: 40 | 90 days supply | Qty: 180 | Fill #0

## 2016-07-03 MED FILL — ALLOPURINOL 100 MG TABLET: 100 | 90 days supply | Qty: 90 | Fill #1

## 2016-07-24 MED FILL — MEMANTINE HCL 10 MG TABLET: 10 | 90 days supply | Qty: 180 | Fill #1

## 2016-07-24 MED FILL — VERAPAMIL ER 240 MG TABLET: 240 | 90 days supply | Qty: 90 | Fill #1

## 2016-08-08 DIAGNOSIS — H5213 Myopia, bilateral: Secondary | ICD-10-CM | POA: Diagnosis not present

## 2016-08-08 DIAGNOSIS — H26493 Other secondary cataract, bilateral: Secondary | ICD-10-CM | POA: Diagnosis not present

## 2016-08-08 DIAGNOSIS — H40013 Open angle with borderline findings, low risk, bilateral: Secondary | ICD-10-CM | POA: Diagnosis not present

## 2016-08-08 DIAGNOSIS — H5022 Vertical strabismus, left eye: Secondary | ICD-10-CM | POA: Diagnosis not present

## 2016-08-08 DIAGNOSIS — E119 Type 2 diabetes mellitus without complications: Secondary | ICD-10-CM | POA: Diagnosis not present

## 2016-08-08 DIAGNOSIS — H52223 Regular astigmatism, bilateral: Secondary | ICD-10-CM | POA: Diagnosis not present

## 2016-08-08 DIAGNOSIS — H524 Presbyopia: Secondary | ICD-10-CM | POA: Diagnosis not present

## 2016-08-16 ENCOUNTER — Other Ambulatory Visit: Payer: Self-pay | Admitting: Internal Medicine

## 2016-08-16 MED FILL — METOPROLOL TARTRATE 100 MG: 100 | 90 days supply | Qty: 180 | Fill #0

## 2016-08-16 MED FILL — IRBESARTAN 150 MG TABLET: 150 | 30 days supply | Qty: 30 | Fill #3

## 2016-08-19 ENCOUNTER — Other Ambulatory Visit: Payer: Self-pay | Admitting: Internal Medicine

## 2016-08-19 DIAGNOSIS — R918 Other nonspecific abnormal finding of lung field: Secondary | ICD-10-CM

## 2016-09-16 ENCOUNTER — Ambulatory Visit (INDEPENDENT_AMBULATORY_CARE_PROVIDER_SITE_OTHER)
Admission: RE | Admit: 2016-09-16 | Discharge: 2016-09-16 | Disposition: A | Discharge status: Home / Self Care | Payer: PPO | Source: Ambulatory Visit | Attending: Internal Medicine | Admitting: Internal Medicine

## 2016-09-16 DIAGNOSIS — R918 Other nonspecific abnormal finding of lung field: Secondary | ICD-10-CM

## 2016-09-16 NOTE — Progress Notes (Signed)
Spoke with pt and notified of results per Dr. Wert. Pt verbalized understanding and denied any questions. 

## 2016-09-17 ENCOUNTER — Telehealth: Payer: Self-pay | Admitting: Internal Medicine

## 2016-09-17 NOTE — Telephone Encounter (Signed)
Spoke with pt and he states that someone already called him yesterday and advised that he does not need follow up at this time. Pt aware of repeat CT scan in 1 year. Nothing further needed.

## 2016-09-19 ENCOUNTER — Encounter: Payer: Self-pay | Admitting: Family Medicine

## 2016-09-19 ENCOUNTER — Ambulatory Visit (INDEPENDENT_AMBULATORY_CARE_PROVIDER_SITE_OTHER): Payer: PPO | Admitting: Family Medicine

## 2016-09-19 VITALS — BP 122/83 | HR 68 | Temp 98.1°F | Resp 17 | Ht 68.0 in | Wt 222.6 lb

## 2016-09-19 DIAGNOSIS — R413 Other amnesia: Secondary | ICD-10-CM

## 2016-09-19 DIAGNOSIS — N400 Enlarged prostate without lower urinary tract symptoms: Secondary | ICD-10-CM

## 2016-09-19 DIAGNOSIS — M109 Gout, unspecified: Secondary | ICD-10-CM

## 2016-09-19 DIAGNOSIS — Z0000 Encounter for general adult medical examination without abnormal findings: Secondary | ICD-10-CM

## 2016-09-19 DIAGNOSIS — Z8669 Personal history of other diseases of the nervous system and sense organs: Secondary | ICD-10-CM

## 2016-09-19 DIAGNOSIS — Z23 Encounter for immunization: Secondary | ICD-10-CM

## 2016-09-19 DIAGNOSIS — I10 Essential (primary) hypertension: Secondary | ICD-10-CM | POA: Diagnosis not present

## 2016-09-19 DIAGNOSIS — R059 Cough, unspecified: Secondary | ICD-10-CM

## 2016-09-19 DIAGNOSIS — M545 Low back pain: Secondary | ICD-10-CM

## 2016-09-19 DIAGNOSIS — Z87898 Personal history of other specified conditions: Secondary | ICD-10-CM

## 2016-09-19 DIAGNOSIS — E785 Hyperlipidemia, unspecified: Secondary | ICD-10-CM

## 2016-09-19 DIAGNOSIS — R05 Cough: Secondary | ICD-10-CM

## 2016-09-19 DIAGNOSIS — K219 Gastro-esophageal reflux disease without esophagitis: Secondary | ICD-10-CM

## 2016-09-19 DIAGNOSIS — M1000 Idiopathic gout, unspecified site: Secondary | ICD-10-CM

## 2016-09-19 LAB — LIPID PANEL
CHOL/HDL RATIO: 4
Cholesterol: 152 mg/dL (ref 0–200)
HDL: 42.8 mg/dL (ref >39.00)
LDL Cholesterol: 81 mg/dL (ref 0–99)
NONHDL: 109.18
Triglycerides: 141 mg/dL (ref 0.0–149.0)
VLDL: 28.2 mg/dL (ref 0.0–40.0)

## 2016-09-19 LAB — POCT URINALYSIS DIPSTICK
BILIRUBIN UA: NEGATIVE
Blood, UA: NEGATIVE
GLUCOSE UA: NEGATIVE
Ketones, UA: NEGATIVE
LEUKOCYTES UA: NEGATIVE
NITRITE UA: NEGATIVE
PH UA: 6
Protein, UA: NEGATIVE
Spec Grav, UA: 1.025
Urobilinogen, UA: NEGATIVE

## 2016-09-19 LAB — CBC WITH DIFFERENTIAL/PLATELET
BASOS ABS: 0 10*3/uL (ref 0.0–0.1)
Basophils Relative: 0.4 % (ref 0.0–3.0)
Eosinophils Absolute: 0.1 10*3/uL (ref 0.0–0.7)
Eosinophils Relative: 1.6 % (ref 0.0–5.0)
HEMATOCRIT: 39.4 % (ref 39.0–52.0)
Hemoglobin: 13.5 g/dL (ref 13.0–17.0)
LYMPHS PCT: 20.9 % (ref 12.0–46.0)
Lymphs Abs: 1.7 10*3/uL (ref 0.7–4.0)
MCHC: 34.4 g/dL (ref 30.0–36.0)
MCV: 91.2 fl (ref 78.0–100.0)
MONOS PCT: 6.5 % (ref 3.0–12.0)
Monocytes Absolute: 0.5 10*3/uL (ref 0.1–1.0)
Neutro Abs: 5.7 10*3/uL (ref 1.4–7.7)
Neutrophils Relative %: 70.6 % (ref 43.0–77.0)
Platelets: 223 10*3/uL (ref 150.0–400.0)
RBC: 4.32 Mil/uL (ref 4.22–5.81)
RDW: 14.1 % (ref 11.5–15.5)
WBC: 8.1 10*3/uL (ref 4.0–10.5)

## 2016-09-19 LAB — COMPREHENSIVE METABOLIC PANEL
ALT: 40 U/L (ref 0–53)
AST: 25 U/L (ref 0–37)
Albumin: 3.8 g/dL (ref 3.5–5.2)
Alkaline Phosphatase: 88 U/L (ref 39–117)
BILIRUBIN TOTAL: 0.5 mg/dL (ref 0.2–1.2)
BUN: 18 mg/dL (ref 6–23)
CALCIUM: 9 mg/dL (ref 8.4–10.5)
CO2: 26 meq/L (ref 19–32)
CREATININE: 1.07 mg/dL (ref 0.40–1.50)
Chloride: 110 mEq/L (ref 96–112)
GFR: 73.39 mL/min (ref >60.00)
GLUCOSE: 112 mg/dL — AB (ref 70–99)
Potassium: 3.5 mEq/L (ref 3.5–5.1)
Sodium: 144 mEq/L (ref 135–145)
Total Protein: 6.5 g/dL (ref 6.0–8.3)

## 2016-09-19 LAB — URIC ACID: URIC ACID, SERUM: 5.9 mg/dL (ref 4.0–7.8)

## 2016-09-19 LAB — PSA: PSA: 0.78 ng/mL (ref 0.10–4.00)

## 2016-09-19 MED ORDER — IRBESARTAN 150 MG PO TABS: 150.0000 mg | ORAL_TABLET | Freq: Every day | ORAL | 0 refills | Status: DC

## 2016-09-19 MED ORDER — CELECOXIB 200 MG PO CAPS: ORAL_CAPSULE | ORAL | 3 refills | Status: DC

## 2016-09-19 MED ORDER — TERAZOSIN HCL 5 MG PO CAPS: ORAL_CAPSULE | ORAL | 3 refills | Status: DC

## 2016-09-19 MED ORDER — ALLOPURINOL 100 MG PO TABS: 100.0000 mg | ORAL_TABLET | Freq: Every day | ORAL | 3 refills | Status: DC

## 2016-09-19 MED ORDER — FUROSEMIDE 20 MG PO TABS: 20.0000 mg | ORAL_TABLET | Freq: Every day | ORAL | 3 refills | Status: DC

## 2016-09-19 MED ORDER — MEMANTINE HCL 10 MG PO TABS: 10.0000 mg | ORAL_TABLET | Freq: Two times a day (BID) | ORAL | 3 refills | Status: DC

## 2016-09-19 MED ORDER — POTASSIUM GLUCONATE 550 MG PO TABS: 550.0000 mg | ORAL_TABLET | Freq: Every day | ORAL | 3 refills | Status: DC

## 2016-09-19 MED ORDER — METOPROLOL TARTRATE 100 MG PO TABS: 100.0000 mg | ORAL_TABLET | Freq: Two times a day (BID) | ORAL | 3 refills | Status: DC

## 2016-09-19 MED ORDER — PANTOPRAZOLE SODIUM 40 MG PO TBEC: 40.0000 mg | DELAYED_RELEASE_TABLET | Freq: Two times a day (BID) | ORAL | 3 refills | Status: DC

## 2016-09-19 MED ORDER — PRAVASTATIN SODIUM 10 MG PO TABS: 10.0000 mg | ORAL_TABLET | Freq: Every day | ORAL | 3 refills | Status: DC

## 2016-09-19 MED ORDER — TOPIRAMATE 25 MG PO TABS: 25.0000 mg | ORAL_TABLET | Freq: Three times a day (TID) | ORAL | 3 refills | Status: DC

## 2016-09-19 MED ORDER — LEVOCETIRIZINE DIHYDROCHLORIDE 5 MG PO TABS
5.0000 mg | ORAL_TABLET | Freq: Every evening | ORAL | 3 refills | Status: DC
Start: 2016-09-19 — End: 2016-12-20

## 2016-09-19 MED ORDER — VERAPAMIL HCL ER 240 MG PO TBCR
240.0000 mg | EXTENDED_RELEASE_TABLET | Freq: Every day | ORAL | 1 refills | Status: DC
Start: 2016-09-19 — End: 2017-03-03

## 2016-09-19 MED ORDER — VERAPAMIL HCL ER 240 MG PO TBCR: 240.0000 mg | EXTENDED_RELEASE_TABLET | Freq: Every day | ORAL | 1 refills | Status: DC

## 2016-09-19 NOTE — Progress Notes (Signed)
Pre visit review using our clinic review tool, if applicable. No additional management support is needed unless otherwise documented below in the visit note. 
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
## 2016-09-19 NOTE — Patient Instructions (Signed)

## 2016-09-24 MED FILL — PRAVASTATIN NA 10 MG TAB: 10 | 90 days supply | Qty: 90 | Fill #0

## 2016-09-26 ENCOUNTER — Other Ambulatory Visit: Payer: Self-pay | Admitting: Family

## 2016-09-26 MED FILL — TOPIRAMATE 25 MG TABLET: 25 | 90 days supply | Qty: 270 | Fill #1

## 2016-09-26 MED FILL — FUROSEMIDE 20 MG TABLET: 20 | 90 days supply | Qty: 90 | Fill #1

## 2016-09-26 MED FILL — CELECOXIB 200 MG CAPSULE: 200 | 90 days supply | Qty: 180 | Fill #1

## 2016-09-26 MED FILL — ALLOPURINOL 100 MG TABLET: 100 | 90 days supply | Qty: 90 | Fill #2

## 2016-09-26 MED FILL — IRBESARTAN 150 MG TABLET: 150 | 30 days supply | Qty: 30 | Fill #4

## 2016-09-26 MED FILL — LEVOCETIRIZINE 5 MG TABLET: 5 | 30 days supply | Qty: 30 | Fill #2

## 2016-09-26 MED FILL — PANTOPRAZOLE SOD DR 40 MG T: 40 | 90 days supply | Qty: 180 | Fill #1

## 2016-10-02 ENCOUNTER — Other Ambulatory Visit: Payer: Self-pay

## 2016-10-02 MED ORDER — TERAZOSIN HCL 5 MG PO CAPS: ORAL_CAPSULE | ORAL | 3 refills | Status: DC

## 2016-10-02 MED FILL — TERAZOSIN 5 MG CAPSULE: 5 | 90 days supply | Qty: 90 | Fill #0

## 2016-10-04 ENCOUNTER — Ambulatory Visit: Payer: PPO | Admitting: Family
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
## 2016-10-08 ENCOUNTER — Ambulatory Visit (INDEPENDENT_AMBULATORY_CARE_PROVIDER_SITE_OTHER): Payer: PPO | Admitting: Family Medicine

## 2016-10-08 ENCOUNTER — Encounter: Payer: Self-pay | Admitting: Family Medicine

## 2016-10-08 ENCOUNTER — Ambulatory Visit: Payer: PPO

## 2016-10-08 VITALS — BP 128/80 | HR 69 | Wt 227.0 lb

## 2016-10-08 DIAGNOSIS — M170 Bilateral primary osteoarthritis of knee: Secondary | ICD-10-CM | POA: Diagnosis not present

## 2016-10-08 DIAGNOSIS — M48062 Spinal stenosis, lumbar region with neurogenic claudication: Secondary | ICD-10-CM

## 2016-10-08 DIAGNOSIS — M25561 Pain in right knee: Secondary | ICD-10-CM

## 2016-10-08 MED ORDER — GABAPENTIN 100 MG PO CAPS: 200.0000 mg | ORAL_CAPSULE | Freq: Every day | ORAL | 3 refills | Status: DC

## 2016-10-08 MED FILL — GABAPENTIN 100 MG CAPSULE: 100 | 30 days supply | Qty: 60 | Fill #0

## 2016-10-08 NOTE — Assessment & Plan Note (Signed)
Bilateral injections given. Tolerated the procedure well. We discussed icing regimen, will be fitted for custom brace on the left side for stability. We discussed that patient could need treatment replacement at some point but patient was to try all other conservative therapy. Has done formal physical therapy in the past and declined another repeat today. Topical anti-inflammatory given, we discussed over-the-counter medications. Follow-up in 4-6 weeks. If continuing have pain he could be a candidate for viscous supplementation.

## 2016-10-08 NOTE — Assessment & Plan Note (Signed)
Concern some of his pain could be from lumbar radiculopathy. We discussed the patient does have severe stenosis. Maybe also do well with nerve root injections if patient does not respond. Started on gabapentin. Encourage patient to continue to try to be active. Follow-up again in 4 weeks

## 2016-10-08 NOTE — Patient Instructions (Signed)
Good to see you.  Ice 20 minutes 2 times daily. Usually after activity and before bed. pennsaid pinkie amount topically 2 times daily as needed.   Gabapentin 200mg  at night may help you sleep and help your back .  They will call you on the brace for your knee.  Vitamin D 2000 Iu daily  Turmeric 500mg  daily  Tart cherry extract any dose at night  See me again in 4 weeks to make sure you are doing well.

## 2016-10-17 ENCOUNTER — Other Ambulatory Visit: Payer: Self-pay | Admitting: Family Medicine

## 2016-10-17 DIAGNOSIS — I10 Essential (primary) hypertension: Secondary | ICD-10-CM

## 2016-10-17 MED FILL — VERAPAMIL ER 240 MG TABLET: 240 | 90 days supply | Qty: 90 | Fill #0

## 2016-10-17 MED FILL — MEMANTINE HCL 10 MG TABLET: 10 | 90 days supply | Qty: 180 | Fill #2

## 2016-10-19 MED FILL — LEVOCETIRIZINE 5 MG TABLET: 5 | 30 days supply | Qty: 30 | Fill #3

## 2016-10-28 DIAGNOSIS — M1712 Unilateral primary osteoarthritis, left knee: Secondary | ICD-10-CM | POA: Diagnosis not present
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
## 2016-11-05 ENCOUNTER — Encounter: Payer: Self-pay | Admitting: Family Medicine

## 2016-11-05 ENCOUNTER — Ambulatory Visit (INDEPENDENT_AMBULATORY_CARE_PROVIDER_SITE_OTHER): Payer: PPO | Admitting: Family Medicine

## 2016-11-05 DIAGNOSIS — M170 Bilateral primary osteoarthritis of knee: Secondary | ICD-10-CM

## 2016-11-05 NOTE — Patient Instructions (Signed)
gooto see you  Happy holidays!  Lets try the gabapentin at 300mg  for next week and then if you want I can increase the dose and just call me Love the brace Keep trucking on the knee.  pennsaid pinkie amount topically 2 times daily as needed.  See me again in 6 weeks incase the knee acts up or call me sooner.

## 2016-11-05 NOTE — Assessment & Plan Note (Signed)
Improving at this moment. Discussed with patient to continue with conservative therapy. Follow-up aning symptomonsider another corticosteroid injectisupplementation.Encourage patient to conl other therapy.

## 2016-11-07 MED FILL — GABAPENTIN 100 MG CAPSULE: 100 | 30 days supply | Qty: 60 | Fill #1

## 2016-11-15 ENCOUNTER — Other Ambulatory Visit: Payer: Self-pay | Admitting: Family Medicine

## 2016-11-15 DIAGNOSIS — R059 Cough, unspecified: Secondary | ICD-10-CM

## 2016-11-15 DIAGNOSIS — R05 Cough: Secondary | ICD-10-CM

## 2016-11-18 MED FILL — LEVOCETIRIZINE 5 MG TABLET: 5 | 30 days supply | Qty: 30 | Fill #0

## 2016-12-10 ENCOUNTER — Other Ambulatory Visit: Payer: Self-pay | Admitting: Internal Medicine

## 2016-12-10 MED FILL — SYMBICORT 160-4.5 MCG INH: 160-4.5 | 30 days supply | Qty: 10 | Fill #1

## 2016-12-10 MED FILL — GABAPENTIN 100 MG CAPSULE: 100 | 30 days supply | Qty: 60 | Fill #2

## 2016-12-10 MED FILL — MOMETASONE FUROATE 50 MCG S: 50 | 30 days supply | Qty: 17 | Fill #0

## 2016-12-17 MED FILL — LEVOCETIRIZINE 5 MG TABLET: 5 | 30 days supply | Qty: 30 | Fill #1
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
## 2016-12-20 ENCOUNTER — Ambulatory Visit: Payer: Self-pay

## 2016-12-20 ENCOUNTER — Encounter: Payer: Self-pay | Admitting: Family Medicine

## 2016-12-20 ENCOUNTER — Ambulatory Visit (INDEPENDENT_AMBULATORY_CARE_PROVIDER_SITE_OTHER): Payer: PPO | Admitting: Family Medicine

## 2016-12-20 VITALS — BP 116/72 | HR 65 | Ht 68.0 in | Wt 237.0 lb

## 2016-12-20 DIAGNOSIS — M25551 Pain in right hip: Secondary | ICD-10-CM

## 2016-12-20 DIAGNOSIS — M170 Bilateral primary osteoarthritis of knee: Secondary | ICD-10-CM

## 2016-12-20 DIAGNOSIS — M7061 Trochanteric bursitis, right hip: Secondary | ICD-10-CM | POA: Insufficient documentation

## 2016-12-20 MED ORDER — GABAPENTIN 300 MG PO CAPS: 300.0000 mg | ORAL_CAPSULE | Freq: Every day | ORAL | 3 refills | Status: DC

## 2016-12-20 MED ORDER — GABAPENTIN 300 MG PO CAPS: 300.0000 mg | ORAL_CAPSULE | Freq: Every day | ORAL | 1 refills | Status: DC

## 2016-12-20 MED FILL — GABAPENTIN 300 MG CAPSULE: 300 | 90 days supply | Qty: 90 | Fill #0

## 2016-12-20 NOTE — Patient Instructions (Addendum)
Great to see you! Happy New Year!  We injected the right hip and hope it helps.  If it does not then we will need you to see Dr. Trenton Gammon and may need to look at your back again.  We can repeat every 10 weeks or if it does not last as long then come back and we can do the other type of injections.  Ice is your friend (even when it is cold outside) Sorry on the prescription, lets try the gabapentin at night.  pennsaid pinkie amount topically 2 times daily as needed.  See me again when you need me

## 2016-12-20 NOTE — Assessment & Plan Note (Signed)
Stable and this time. If any worsening symptoms we'll consider repeating injection. Encourage him to continue with the therapy. No changes in management.

## 2016-12-20 NOTE — Assessment & Plan Note (Signed)
Patient given injection today and tolerated the procedure well. We discussed icing regimen and home exercises. We discussed which activities to do in which ones to avoid. Patient will continue to be active otherwise. Encourage topical anti-inflammatories. Follow-up again in 6 weeks. Differential does include an spinal stenosis with patient's history of lumbar surgery. Discussed with patient if no significant improvement he should seek medical attention with his neurosurgeon.

## 2016-12-26 MED FILL — TERAZOSIN 5 MG CAPSULE: 5 | 90 days supply | Qty: 90 | Fill #1

## 2016-12-26 MED FILL — ALLOPURINOL 100 MG TABLET: 100 | 90 days supply | Qty: 90 | Fill #3

## 2016-12-26 MED FILL — FUROSEMIDE 20 MG TABLET: 20 | 90 days supply | Qty: 90 | Fill #2

## 2016-12-26 MED FILL — CELECOXIB 200 MG CAPSULE: 200 | 90 days supply | Qty: 180 | Fill #2

## 2016-12-26 MED FILL — PANTOPRAZOLE SOD DR 40 MG T: 40 | 90 days supply | Qty: 180 | Fill #2

## 2016-12-26 MED FILL — IRBESARTAN 150 MG TABLET: 150 | 30 days supply | Qty: 30 | Fill #5

## 2016-12-26 MED FILL — TOPIRAMATE 25 MG TABLET: 25 | 90 days supply | Qty: 270 | Fill #2

## 2017-01-13 MED FILL — MEMANTINE HCL 10 MG TABLET: 10 | 90 days supply | Qty: 180 | Fill #3

## 2017-01-13 MED FILL — VERAPAMIL ER 240 MG TABLET: 240 | 90 days supply | Qty: 90 | Fill #1

## 2017-01-13 MED FILL — LEVOCETIRIZINE 5 MG TABLET: 5 | 30 days supply | Qty: 30 | Fill #2
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
## 2017-01-14 ENCOUNTER — Ambulatory Visit (INDEPENDENT_AMBULATORY_CARE_PROVIDER_SITE_OTHER): Payer: PPO | Admitting: Family Medicine

## 2017-01-14 ENCOUNTER — Encounter: Payer: Self-pay | Admitting: Family Medicine

## 2017-01-14 ENCOUNTER — Ambulatory Visit: Payer: Self-pay

## 2017-01-14 VITALS — BP 122/76 | HR 76 | Ht 68.0 in | Wt 237.0 lb

## 2017-01-14 DIAGNOSIS — M25552 Pain in left hip: Secondary | ICD-10-CM

## 2017-01-14 DIAGNOSIS — M7062 Trochanteric bursitis, left hip: Secondary | ICD-10-CM | POA: Insufficient documentation

## 2017-01-14 NOTE — Assessment & Plan Note (Signed)
Patient given injection today and tolerated the procedure well. We discussed icing regimen and home exercises. We discussed which activities to do an which was potentially avoid. Patient will continue to be active otherwise. Follow-up with me again as long as patient does well on an as-needed basis.  Spent  25 minutes with patient face-to-face and had greater than 50% of counseling including as described above in assessment and plan.

## 2017-01-14 NOTE — Patient Instructions (Addendum)
Good to see you  Garrett [name] is your friend.  Stay active Get some of those pounds off See me again when you need me

## 2017-01-19 ENCOUNTER — Emergency Department (INDEPENDENT_AMBULATORY_CARE_PROVIDER_SITE_OTHER)
Admission: EM | Admit: 2017-01-19 | Discharge: 2017-01-19 | Disposition: A | Discharge status: Home / Self Care | Payer: PPO | Source: Home / Self Care | Attending: Emergency Medicine | Admitting: Emergency Medicine

## 2017-01-19 ENCOUNTER — Emergency Department (INDEPENDENT_AMBULATORY_CARE_PROVIDER_SITE_OTHER): Payer: PPO

## 2017-01-19 DIAGNOSIS — R918 Other nonspecific abnormal finding of lung field: Secondary | ICD-10-CM

## 2017-01-19 DIAGNOSIS — J441 Chronic obstructive pulmonary disease with (acute) exacerbation: Secondary | ICD-10-CM

## 2017-01-19 DIAGNOSIS — J181 Lobar pneumonia, unspecified organism: Secondary | ICD-10-CM | POA: Diagnosis not present

## 2017-01-19 DIAGNOSIS — R059 Cough, unspecified: Secondary | ICD-10-CM

## 2017-01-19 DIAGNOSIS — J189 Pneumonia, unspecified organism: Secondary | ICD-10-CM

## 2017-01-19 DIAGNOSIS — R062 Wheezing: Secondary | ICD-10-CM

## 2017-01-19 DIAGNOSIS — R0600 Dyspnea, unspecified: Secondary | ICD-10-CM

## 2017-01-19 DIAGNOSIS — R0602 Shortness of breath: Secondary | ICD-10-CM | POA: Diagnosis not present

## 2017-01-19 DIAGNOSIS — R05 Cough: Secondary | ICD-10-CM

## 2017-01-19 MED ORDER — PREDNISONE 10 MG (21) PO TBPK: ORAL_TABLET | ORAL | 0 refills | Status: DC

## 2017-01-19 MED ORDER — IPRATROPIUM-ALBUTEROL 0.5-2.5 (3) MG/3ML IN SOLN
2.0000 mL | RESPIRATORY_TRACT | Status: AC
  Administered 2017-01-19: 2 mL via RESPIRATORY_TRACT

## 2017-01-19 MED ORDER — CEFTRIAXONE SODIUM 1 G IJ SOLR
1.0000 g | INTRAMUSCULAR | Status: AC
  Administered 2017-01-19: 1 g via INTRAMUSCULAR

## 2017-01-19 MED ORDER — LEVOFLOXACIN 500 MG PO TABS: ORAL_TABLET | ORAL | 0 refills | Status: DC

## 2017-01-19 MED ORDER — AMOXICILLIN-POT CLAVULANATE 875-125 MG PO TABS: 1.0000 | ORAL_TABLET | Freq: Two times a day (BID) | ORAL | 0 refills | Status: DC

## 2017-01-19 NOTE — ED Triage Notes (Signed)
Patient states that he has been sick/ not feeling well all week, states if he walks only a few feet he feels as if he gets short of breath, says he started feeling worse this Friday. He has tried OTC  Mucinex and Sudafed
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
## 2017-01-19 NOTE — Discharge Instructions (Signed)
Here in urgent care, we "jump started" your treatment with DuoNeb nebulizer treatment and a shot of Rocephin.(Antibiotic). Be sure to take medications as prescribed. Follow-up with your doctor for recheck within 5-7 days If any severe or worsening symptoms, go to emergency room.

## 2017-01-23 ENCOUNTER — Encounter: Payer: Self-pay | Admitting: Emergency Medicine

## 2017-01-23 ENCOUNTER — Telehealth: Payer: Self-pay | Admitting: Internal Medicine

## 2017-01-23 ENCOUNTER — Emergency Department (INDEPENDENT_AMBULATORY_CARE_PROVIDER_SITE_OTHER)
Admission: EM | Admit: 2017-01-23 | Discharge: 2017-01-23 | Disposition: A | Discharge status: Home / Self Care | Payer: PPO | Source: Home / Self Care | Attending: Family Medicine | Admitting: Family Medicine

## 2017-01-23 ENCOUNTER — Encounter: Payer: Self-pay | Admitting: Internal Medicine

## 2017-01-23 ENCOUNTER — Emergency Department (INDEPENDENT_AMBULATORY_CARE_PROVIDER_SITE_OTHER): Payer: PPO

## 2017-01-23 DIAGNOSIS — I10 Essential (primary) hypertension: Secondary | ICD-10-CM | POA: Diagnosis not present

## 2017-01-23 DIAGNOSIS — J189 Pneumonia, unspecified organism: Secondary | ICD-10-CM

## 2017-01-23 DIAGNOSIS — J181 Lobar pneumonia, unspecified organism: Secondary | ICD-10-CM

## 2017-01-23 DIAGNOSIS — Z87891 Personal history of nicotine dependence: Secondary | ICD-10-CM | POA: Diagnosis not present

## 2017-01-23 DIAGNOSIS — R911 Solitary pulmonary nodule: Secondary | ICD-10-CM

## 2017-01-23 DIAGNOSIS — J45909 Unspecified asthma, uncomplicated: Secondary | ICD-10-CM | POA: Diagnosis not present

## 2017-01-23 MED ORDER — CEFTRIAXONE SODIUM 1 G IJ SOLR
1.0000 g | Freq: Once | INTRAMUSCULAR | Status: AC
  Administered 2017-01-23: 1 g via INTRAMUSCULAR

## 2017-01-23 MED ORDER — BENZONATATE 100 MG PO CAPS: 100.0000 mg | ORAL_CAPSULE | Freq: Three times a day (TID) | ORAL | 0 refills | Status: DC

## 2017-01-23 MED ORDER — ALBUTEROL SULFATE HFA 108 (90 BASE) MCG/ACT IN AERS: 1.0000 | INHALATION_SPRAY | Freq: Four times a day (QID) | RESPIRATORY_TRACT | 0 refills | Status: DC | PRN

## 2017-01-23 MED ORDER — OSELTAMIVIR PHOSPHATE 75 MG PO CAPS: 75.0000 mg | ORAL_CAPSULE | Freq: Two times a day (BID) | ORAL | 0 refills | Status: DC

## 2017-01-23 MED ORDER — ALBUTEROL SULFATE (2.5 MG/3ML) 0.083% IN NEBU: 2.5000 mg | INHALATION_SOLUTION | Freq: Four times a day (QID) | RESPIRATORY_TRACT | 12 refills | Status: DC | PRN

## 2017-01-23 MED ORDER — IPRATROPIUM-ALBUTEROL 0.5-2.5 (3) MG/3ML IN SOLN
3.0000 mL | Freq: Once | RESPIRATORY_TRACT | Status: AC
  Administered 2017-01-23: 3 mL via RESPIRATORY_TRACT

## 2017-01-23 NOTE — Telephone Encounter (Signed)
Needs ov with all meds in hand in next 2 weeks

## 2017-01-23 NOTE — Telephone Encounter (Signed)
Spoke with pt. And made him aware of MW recc. Pt. Has agreed to an appointment. The appointment has been made. Nothing further is needed

## 2017-01-23 NOTE — ED Triage Notes (Signed)
Patient was diagnosed with pneumonia 01/19/17; started feeling better/stronger over  Past 4 days until today when he reports return of weakness and labored respirations.

## 2017-01-23 NOTE — Discharge Instructions (Signed)
°  You may take 400-600mg Ibuprofen (Motrin) every 6-8 hours for fever and pain  °Alternate with Tylenol  °You may take 500mg Tylenol every 4-6 hours as needed for fever and pain  °Follow-up with your primary care provider next week for recheck of symptoms if not improving.  °Be sure to drink plenty of fluids and rest, at least 8hrs of sleep a night, preferably more while you are sick. °Return urgent care or go to closest ER if you cannot keep down fluids/signs of dehydration, fever not reducing with Tylenol, difficulty breathing/wheezing, stiff neck, worsening condition, or other concerns (see below)  °Please take antibiotics as prescribed and be sure to complete entire course even if you start to feel better to ensure infection does not come back. ° °
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
## 2017-01-23 NOTE — Telephone Encounter (Signed)
lmtcb x1 for pt. Pt needs OV with MW.

## 2017-01-23 NOTE — Telephone Encounter (Signed)
Spoke with the pt  He states has been seen this wk at UC x 2 for PNA  He had cxr today and the UC told him that he should call here to have a ct chest set up  He had last ct chest for f/u nodules 09/16/16 and we rec to have this repeated again in 1 yr You can view all of the UC notes in Epic  Please advise thanks!

## 2017-01-24 ENCOUNTER — Ambulatory Visit: Payer: PPO | Admitting: Family Medicine

## 2017-01-24 NOTE — Telephone Encounter (Signed)
OV has been scheduled with MW. Message will be closed.

## 2017-01-27 ENCOUNTER — Ambulatory Visit (INDEPENDENT_AMBULATORY_CARE_PROVIDER_SITE_OTHER): Payer: PPO | Admitting: Medical

## 2017-01-27 ENCOUNTER — Ambulatory Visit (HOSPITAL_BASED_OUTPATIENT_CLINIC_OR_DEPARTMENT_OTHER)
Admission: RE | Admit: 2017-01-27 | Discharge: 2017-01-27 | Disposition: A | Discharge status: Home / Self Care | Payer: PPO | Source: Ambulatory Visit | Attending: Medical | Admitting: Medical

## 2017-01-27 ENCOUNTER — Encounter: Payer: Self-pay | Admitting: Medical

## 2017-01-27 VITALS — BP 108/68 | HR 79 | Temp 98.1°F | Resp 20 | Ht 69.0 in | Wt 224.1 lb

## 2017-01-27 DIAGNOSIS — R0600 Dyspnea, unspecified: Secondary | ICD-10-CM

## 2017-01-27 DIAGNOSIS — R0781 Pleurodynia: Secondary | ICD-10-CM

## 2017-01-27 DIAGNOSIS — R0789 Other chest pain: Secondary | ICD-10-CM

## 2017-01-27 DIAGNOSIS — R05 Cough: Secondary | ICD-10-CM | POA: Diagnosis not present

## 2017-01-27 DIAGNOSIS — J189 Pneumonia, unspecified organism: Secondary | ICD-10-CM

## 2017-01-27 DIAGNOSIS — J209 Acute bronchitis, unspecified: Secondary | ICD-10-CM

## 2017-01-27 DIAGNOSIS — R062 Wheezing: Secondary | ICD-10-CM

## 2017-01-27 LAB — CBC WITH DIFFERENTIAL/PLATELET
Basophils Absolute: 0 10*3/uL (ref 0.0–0.1)
Basophils Relative: 0.3 % (ref 0.0–3.0)
EOS PCT: 0.9 % (ref 0.0–5.0)
Eosinophils Absolute: 0.1 10*3/uL (ref 0.0–0.7)
HCT: 44.5 % (ref 39.0–52.0)
Hemoglobin: 14.9 g/dL (ref 13.0–17.0)
Lymphocytes Relative: 14 % (ref 12.0–46.0)
Lymphs Abs: 2.1 10*3/uL (ref 0.7–4.0)
MCHC: 33.6 g/dL (ref 30.0–36.0)
MCV: 92.2 fl (ref 78.0–100.0)
MONO ABS: 0.9 10*3/uL (ref 0.1–1.0)
Monocytes Relative: 6.2 % (ref 3.0–12.0)
NEUTROS PCT: 78.6 % — AB (ref 43.0–77.0)
Neutro Abs: 11.5 10*3/uL — ABNORMAL HIGH (ref 1.4–7.7)
Platelets: 244 10*3/uL (ref 150.0–400.0)
RBC: 4.82 Mil/uL (ref 4.22–5.81)
RDW: 13.8 % (ref 11.5–15.5)
WBC: 14.7 10*3/uL — ABNORMAL HIGH (ref 4.0–10.5)

## 2017-01-27 LAB — TROPONIN I: TNIDX: 0.01 ug/L (ref 0.00–0.06)

## 2017-01-27 LAB — BRAIN NATRIURETIC PEPTIDE: Pro B Natriuretic peptide (BNP): 17 pg/mL (ref 0.0–100.0)

## 2017-01-27 MED ORDER — BUDESONIDE-FORMOTEROL FUMARATE 80-4.5 MCG/ACT IN AERO: 2.0000 | INHALATION_SPRAY | Freq: Two times a day (BID) | RESPIRATORY_TRACT | 3 refills | Status: DC

## 2017-01-27 MED ORDER — TRUETRACK TEST VI STRP: ORAL_STRIP | 11 refills | Status: DC
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
## 2017-01-27 NOTE — Patient Instructions (Addendum)
For recent CAP(pneumonia), wheezing, pleuritic pain, chest wall discomfort and fatigue will get labs and cxr.  Will call with results of labs and cxr when in.  Use neb machine every 6 hours if needed. Use you inhalers as well.   Will follow xrays to see if further antibiotic indicated. Advise resting for 3-4 days at least/not working until feeling back to baseline.  Follow up in 7 days or as needed

## 2017-01-27 NOTE — Progress Notes (Signed)
Pre visit review using our clinic review tool, if applicable. No additional management support is needed unless otherwise documented below in the visit note/SLS  

## 2017-01-28 ENCOUNTER — Telehealth: Payer: Self-pay | Admitting: Emergency Medicine

## 2017-01-28 MED ORDER — TRUETRACK TEST VI STRP: ORAL_STRIP | 11 refills | Status: DC

## 2017-01-28 MED ORDER — ONETOUCH ULTRA 2 W/DEVICE KIT: PACK | 0 refills | Status: DC

## 2017-01-28 MED ORDER — DOXYCYCLINE HYCLATE 100 MG PO TABS
100.0000 mg | ORAL_TABLET | Freq: Two times a day (BID) | ORAL | 0 refills | Status: DC
Start: 2017-01-28 — End: 2017-02-03

## 2017-01-28 MED FILL — ONE TOUCH DELICA 33G LANCET: 30 days supply | Qty: 100 | Fill #0

## 2017-01-28 MED FILL — DOXYCYCLINE HYCLATE 100 MG: 100 | 7 days supply | Qty: 14 | Fill #0

## 2017-01-28 MED FILL — ONE TOUCH ULTRA 2 GLUCOSE S: W/DEVICE | 30 days supply | Qty: 1 | Fill #0

## 2017-01-28 MED FILL — ONE TOUCH ULTRA TEST STRIPS: 30 days supply | Qty: 100 | Fill #0

## 2017-01-28 NOTE — Telephone Encounter (Signed)
Notes Recorded by Mackie Pai, PA-C on 01/28/2017 at 3:51 PM EST Doxycyline 100 mg tabs Disp:14 Sig: 1 tab po bid. (can put in comment section capsules or generic ok). When you call pt advise him to make sure eats a moderate snack or half meal before taking. The medicine can be rough on empty stomach. ------ Notes Recorded by Emi Holes, CMA on 01/28/2017 at 10:59 AM EST Pt informed of lab results. Provider recommendations discussed. Informed pt doxycycline will be sent over today. ------ Notes Recorded by Emi Holes, CMA on 01/28/2017 at 9:51 AM EST I will send over the doxycycline. Could you please provide dosage strength and directions for use. ------ Notes Recorded by Mackie Pai, PA-C on 01/27/2017 at 9:32 PM EST Pt has improving infiltrate but still has some on xray. Infiltrate is description of pneumonia. I will prescribe 7 days of doxycycllne. His infection fighitng cells were elevated.(infection fighting cell elevation may be from residual pneumonia or from recurrent use of prednisone recently). Follow up in one week or sooner.(Important that he use probiotics otc daily since he has been on repeat antibiotics. If any watery stools while on antibiotic let us know.

## 2017-01-28 NOTE — Telephone Encounter (Signed)
-----   Message from Mackie Pai, PA-C sent at 01/27/2017  9:32 PM EST ----- Pt has improving infiltrate but still has some on xray. Infiltrate is description of pneumonia. I will prescribe 7 days of doxycycllne. His infection fighitng cells were elevated.(infection fighting cell elevation may be from residual pneumonia or from recurrent use of prednisone recently).  Follow up in one week or sooner.(Important that he use probiotics otc daily since he has been on repeat antibiotics. If any watery stools while on antibiotic let us know.

## 2017-01-28 NOTE — Telephone Encounter (Signed)
Doxycycline 100 mg # 14 1 tab po bid x 7 days. Will you send to his pharmacy.

## 2017-01-29 NOTE — Telephone Encounter (Signed)
Pt made aware to take medication with food.

## 2017-02-03 ENCOUNTER — Other Ambulatory Visit (INDEPENDENT_AMBULATORY_CARE_PROVIDER_SITE_OTHER): Payer: PPO

## 2017-02-03 ENCOUNTER — Encounter: Payer: Self-pay | Admitting: Internal Medicine

## 2017-02-03 ENCOUNTER — Ambulatory Visit (INDEPENDENT_AMBULATORY_CARE_PROVIDER_SITE_OTHER): Payer: PPO | Admitting: Internal Medicine

## 2017-02-03 VITALS — BP 140/90 | HR 81 | Ht 69.0 in | Wt 229.4 lb

## 2017-02-03 DIAGNOSIS — R0609 Other forms of dyspnea: Secondary | ICD-10-CM | POA: Diagnosis not present

## 2017-02-03 LAB — CBC WITH DIFFERENTIAL/PLATELET
BASOS ABS: 0.1 10*3/uL (ref 0.0–0.1)
Basophils Relative: 0.9 % (ref 0.0–3.0)
EOS ABS: 0.1 10*3/uL (ref 0.0–0.7)
Eosinophils Relative: 1.1 % (ref 0.0–5.0)
HCT: 41.5 % (ref 39.0–52.0)
Hemoglobin: 14.1 g/dL (ref 13.0–17.0)
LYMPHS ABS: 1.8 10*3/uL (ref 0.7–4.0)
LYMPHS PCT: 16.4 % (ref 12.0–46.0)
MCHC: 33.9 g/dL (ref 30.0–36.0)
MCV: 91.5 fl (ref 78.0–100.0)
Monocytes Absolute: 0.8 10*3/uL (ref 0.1–1.0)
Monocytes Relative: 7.3 % (ref 3.0–12.0)
NEUTROS ABS: 8.1 10*3/uL — AB (ref 1.4–7.7)
NEUTROS PCT: 74.3 % (ref 43.0–77.0)
PLATELETS: 202 10*3/uL (ref 150.0–400.0)
RBC: 4.53 Mil/uL (ref 4.22–5.81)
RDW: 13.6 % (ref 11.5–15.5)
WBC: 10.9 10*3/uL — ABNORMAL HIGH (ref 4.0–10.5)

## 2017-02-03 LAB — BASIC METABOLIC PANEL
BUN: 20 mg/dL (ref 6–23)
CHLORIDE: 112 meq/L (ref 96–112)
CO2: 22 mEq/L (ref 19–32)
Calcium: 9.3 mg/dL (ref 8.4–10.5)
Creatinine, Ser: 1.1 mg/dL (ref 0.40–1.50)
GFR: 71.01 mL/min (ref >60.00)
Glucose, Bld: 108 mg/dL — ABNORMAL HIGH (ref 70–99)
POTASSIUM: 3.5 meq/L (ref 3.5–5.1)
SODIUM: 142 meq/L (ref 135–145)

## 2017-02-03 LAB — D-DIMER, QUANTITATIVE: D-Dimer, Quant: 8.8 mcg/mL FEU — ABNORMAL HIGH (ref <0.50)

## 2017-02-03 LAB — TSH: TSH: 1.24 u[IU]/mL (ref 0.35–4.50)

## 2017-02-03 LAB — SEDIMENTATION RATE: SED RATE: 25 mm/h — AB (ref 0–20)

## 2017-02-03 NOTE — Patient Instructions (Addendum)
Please remember to go to the lab   department downstairs in the basement  for your tests - we will call you with the results when they are available.  Protonix Take 30- 60 min before your first and last meals of the day   GERD (REFLUX)  is an extremely common cause of respiratory symptoms just like yours , many times with no obvious heartburn at all.    It can be treated with medication, but also with lifestyle changes including elevation of the head of your bed (ideally with 6 inch  bed blocks),  Smoking cessation, avoidance of late meals, excessive alcohol, and avoid fatty foods, chocolate, peppermint, colas, red wine, and acidic juices such as orange juice.  NO MINT OR MENTHOL PRODUCTS SO NO COUGH DROPS   USE SUGARLESS CANDY INSTEAD (Jolley ranchers or Stover's or Life Savers) or even ice chips will also do - the key is to swallow to prevent all throat clearing. NO OIL BASED VITAMINS - use powdered substitutes.   Only use you inhalers if you can't do without them   Please schedule a follow up office visit in 4 weeks, sooner if needed with cxr on return  Add: stat CTa ordered 02/04/2017 and venous dopplers if this is neg

## 2017-02-03 NOTE — Progress Notes (Signed)
Subjective:    Patient ID: Garrett [name], male    DOB: 07-14-1951,   MRN: [id number]   Brief patient profile:  65yowm quit smoking 1992 and exposed to dust in [place] until age of 32  no respiratory problems except for OSA until fall 2016 with recurrent coughing/ sob/ wheezing since then already rx with Symbicort/ saba so referred to pulmonary clinic 03/21/2016 by Garrett [name] with abn ct chest.   History of Present Illness  03/21/2016 1st Garrett [name] Pulmonary office visit/ Garrett [name]  On maint symbicort 160  Chief Complaint  Patient presents with  . Pulmonary Consult    Referred by Garrett. Etter [name]. Pt states he had PNA recently and has had cough and SOB for the past few months- esp worse over the past 6 wks. He gets SOB walking short distances such as to his mailbox. His cough is prod at times with yellow/green sputum.    prior to 6 m prior to OV  Back and forth mb fine/ no need for any inhalers but breathing never right since, best rx is prednisone no def resp to symbicort or saba  rec Stop Altace and start avapro  150 mg  One half daily - take a whole if blood pressure too high Pantoprazole Take 30- 60 min before your first and last meals of the day  GERD diet  If get worse > Prednisone 10 mg take  4 each am x 2 days,   2 each am x 2 days,  1 each am x 2 days and stop     05/02/2016  f/u ov/Garrett [name] re:  prob acei uacs / mpns  Chief Complaint  Patient presents with  . Follow-up    Breathing is much improved- almost back to his normal baseline. He is no longer coughing. Has not had to use albuterol.    improved p 2 weeks off acei and now Not limited by breathing from desired activities   rec We will call you for follow up ct in Sept 2017> no change in old nodules, one new only 6 mm > rec 12 m f/u     02/03/2017  Acute extended  ov/Garrett [name] re: new sob  Chief Complaint  Patient presents with  . Follow-up    upper airway cough syndrome, not having much cough but still struggles with dyspnea with exertion    acute ill on a 01/17/17  with fever chills and sob and minimal cough dry > UC same Sunday 01/19/17 Rocephin /levaquin 500 x 10 days and pred rec  Then doxy still has one day left but breathing but sob x 50 fts  Baseline = slower than nl = MMRC2 = can't walk a nl pace on a flat grade s sob but does fine slow and flat eg walking at mall  Not on any kind of regular use inhaler - symbicort listed but not taking Now on neb and it too does not really help his sob Taking ppi but not ac       No noct symptoms at all   /No obvious day to day or daytime variability or assoc excess/ purulent sputum or mucus plugs or hemoptysis or cp or chest tightness, subjective wheeze or overt sinus or hb symptoms. No unusual exp hx or h/o childhood pna/ asthma or knowledge of premature birth.  Sleeping ok without nocturnal  or early am exacerbation  of respiratory  c/o's or need for noct saba. Also denies any obvious fluctuation of symptoms with weather or  environmental changes or other aggravating or alleviating factors except as outlined above   Current Medications, Allergies, Complete Past Medical History, Past Surgical History, Family History, and Social History were reviewed in [employer] record.  ROS  The following are not active complaints unless bolded sore throat, dysphagia, dental problems, itching, sneezing,  nasal congestion or excess/ purulent secretions, ear ache,   fever, chills, sweats, unintended wt loss, classically pleuritic or exertional cp,  orthopnea pnd or leg swelling, presyncope, palpitations, abdominal pain, anorexia, nausea, vomiting, diarrhea  or change in bowel or bladder habits, change in stools or urine, dysuria,hematuria,  rash, arthralgias, visual complaints, headache, numbness, weakness or ataxia or problems with walking or coordination,  change in mood/affect or memory.              Objective:   Physical Exam  amb wm  nad very evasive with answers to questions  related to symptoms:   "I have pna"  "I get winded but not short of breath"  02/03/2017        229  05/02/2016        226  03/21/16 226 lb 3.2 oz (102.604 kg)  03/11/16 222 lb 6.4 oz (100.88 kg)  03/06/16 229 lb (103.874 kg)    Vital signs reviewed  - Note on arrival 02 sats  98% on RA    HEENT: nl   turbinates, and oropharynx. Nl external ear canals without cough reflex - top dentures   NECK :  without JVD/Nodes/TM/ nl carotid upstrokes bilaterally   LUNGS: no acc muscle use,  Nl contour chest which is clear to A and P bilaterally without cough on insp or exp maneuvers   CV:  RRR  no s3 or murmur or increase in P2, no edema   ABD:  soft and nontender with nl inspiratory excursion in the supine position. No bruits or organomegaly, bowel sounds nl  MS:  Nl gait/ ext warm without deformities, calf tenderness, cyanosis or clubbing No obvious joint restrictions   SKIN: warm and dry without lesions    NEURO:  alert, approp, nl sensorium with  no motor deficits     I personally reviewed images and agree with radiology impression as follows:  CXR:   01/27/17 Improving lingular infiltrate.   Labs ordered/ reviewed:      Chemistry      Component Value Date/Time   NA 142 02/03/2017 1050   K 3.5 02/03/2017 1050   CL 112 02/03/2017 1050   CO2 22 02/03/2017 1050   BUN 20 02/03/2017 1050   CREATININE 1.10 02/03/2017 1050   CREATININE 1.08 10/18/2014 0951      Component Value Date/Time   CALCIUM 9.3 02/03/2017 1050   ALKPHOS 88 09/19/2016 0958   AST 25 09/19/2016 0958   ALT 40 09/19/2016 0958   BILITOT 0.5 09/19/2016 0958        Lab Results  Component Value Date   WBC 10.9 (H) 02/03/2017   HGB 14.1 02/03/2017   HCT 41.5 02/03/2017   MCV 91.5 02/03/2017   PLT 202.0 02/03/2017     Lab Results  Component Value Date   DDIMER 8.80 (H) 02/03/2017      Lab Results  Component Value Date   TSH 1.24 02/03/2017     Lab Results  Component Value Date   PROBNP 17.0  01/27/2017       Lab Results  Component Value Date   ESRSEDRATE 25 (H) 02/03/2017   ESRSEDRATE 2 03/24/2012  Assessment & Plan:

## 2017-02-04 ENCOUNTER — Encounter: Payer: Self-pay | Admitting: Internal Medicine

## 2017-02-04 ENCOUNTER — Ambulatory Visit: Payer: PPO | Admitting: Medical

## 2017-02-04 ENCOUNTER — Encounter (HOSPITAL_COMMUNITY): Payer: Self-pay

## 2017-02-04 ENCOUNTER — Telehealth: Payer: Self-pay | Admitting: Internal Medicine

## 2017-02-04 ENCOUNTER — Ambulatory Visit (INDEPENDENT_AMBULATORY_CARE_PROVIDER_SITE_OTHER)
Admission: RE | Admit: 2017-02-04 | Discharge: 2017-02-04 | Disposition: A | Discharge status: Home / Self Care | Payer: PPO | Source: Ambulatory Visit | Attending: Internal Medicine | Admitting: Internal Medicine

## 2017-02-04 ENCOUNTER — Other Ambulatory Visit: Payer: Self-pay

## 2017-02-04 ENCOUNTER — Emergency Department (HOSPITAL_COMMUNITY): Payer: PPO

## 2017-02-04 ENCOUNTER — Emergency Department (HOSPITAL_COMMUNITY)
Admission: EM | Admit: 2017-02-04 | Discharge: 2017-02-04 | Disposition: A | Discharge status: Home / Self Care | Payer: PPO | Attending: Emergency Medicine | Admitting: Emergency Medicine

## 2017-02-04 DIAGNOSIS — R0789 Other chest pain: Secondary | ICD-10-CM | POA: Diagnosis not present

## 2017-02-04 DIAGNOSIS — Z79899 Other long term (current) drug therapy: Secondary | ICD-10-CM | POA: Diagnosis not present

## 2017-02-04 DIAGNOSIS — E119 Type 2 diabetes mellitus without complications: Secondary | ICD-10-CM | POA: Diagnosis not present

## 2017-02-04 DIAGNOSIS — Z87891 Personal history of nicotine dependence: Secondary | ICD-10-CM | POA: Insufficient documentation

## 2017-02-04 DIAGNOSIS — Z7982 Long term (current) use of aspirin: Secondary | ICD-10-CM | POA: Diagnosis not present

## 2017-02-04 DIAGNOSIS — J45909 Unspecified asthma, uncomplicated: Secondary | ICD-10-CM | POA: Insufficient documentation

## 2017-02-04 DIAGNOSIS — R0609 Other forms of dyspnea: Secondary | ICD-10-CM

## 2017-02-04 DIAGNOSIS — R911 Solitary pulmonary nodule: Secondary | ICD-10-CM | POA: Insufficient documentation

## 2017-02-04 DIAGNOSIS — R609 Edema, unspecified: Secondary | ICD-10-CM

## 2017-02-04 DIAGNOSIS — Z85828 Personal history of other malignant neoplasm of skin: Secondary | ICD-10-CM | POA: Insufficient documentation

## 2017-02-04 DIAGNOSIS — R600 Localized edema: Secondary | ICD-10-CM | POA: Insufficient documentation

## 2017-02-04 DIAGNOSIS — R0602 Shortness of breath: Secondary | ICD-10-CM

## 2017-02-04 DIAGNOSIS — R918 Other nonspecific abnormal finding of lung field: Secondary | ICD-10-CM

## 2017-02-04 DIAGNOSIS — I10 Essential (primary) hypertension: Secondary | ICD-10-CM | POA: Diagnosis not present

## 2017-02-04 DIAGNOSIS — R079 Chest pain, unspecified: Secondary | ICD-10-CM

## 2017-02-04 DIAGNOSIS — I2699 Other pulmonary embolism without acute cor pulmonale: Secondary | ICD-10-CM | POA: Diagnosis not present

## 2017-02-04 LAB — I-STAT TROPONIN, ED: TROPONIN I, POC: 0 ng/mL (ref 0.00–0.08)

## 2017-02-04 LAB — CBC
HCT: 42.3 % (ref 39.0–52.0)
Hemoglobin: 13.8 g/dL (ref 13.0–17.0)
MCH: 30.1 pg (ref 26.0–34.0)
MCHC: 32.6 g/dL (ref 30.0–36.0)
MCV: 92.4 fL (ref 78.0–100.0)
Platelets: 200 10*3/uL (ref 150–400)
RBC: 4.58 MIL/uL (ref 4.22–5.81)
RDW: 13.6 % (ref 11.5–15.5)
WBC: 9.9 10*3/uL (ref 4.0–10.5)

## 2017-02-04 LAB — PROTIME-INR
INR: 0.91
Prothrombin Time: 12.2 seconds (ref 11.4–15.2)

## 2017-02-04 LAB — BASIC METABOLIC PANEL
ANION GAP: 11 (ref 5–15)
BUN: 19 mg/dL (ref 6–20)
CALCIUM: 9.2 mg/dL (ref 8.9–10.3)
CO2: 21 mmol/L — AB (ref 22–32)
Chloride: 107 mmol/L (ref 101–111)
Creatinine, Ser: 1.14 mg/dL (ref 0.61–1.24)
Glucose, Bld: 104 mg/dL — ABNORMAL HIGH (ref 65–99)
Potassium: 3.4 mmol/L — ABNORMAL LOW (ref 3.5–5.1)
Sodium: 139 mmol/L (ref 135–145)

## 2017-02-04 MED ORDER — HEPARIN (PORCINE) IN NACL 100-0.45 UNIT/ML-% IJ SOLN
1600.0000 [IU]/h | INTRAMUSCULAR | Status: DC
  Filled 2017-02-04: qty 250

## 2017-02-04 MED ORDER — IOPAMIDOL (ISOVUE-370) INJECTION 76%
80.0000 mL | Freq: Once | INTRAVENOUS | Status: AC | PRN
  Administered 2017-02-04: 80 mL via INTRAVENOUS

## 2017-02-04 MED ORDER — HEPARIN BOLUS VIA INFUSION
5000.0000 [IU] | Freq: Once | INTRAVENOUS | Status: DC
  Filled 2017-02-04: qty 5000

## 2017-02-04 MED ORDER — RIVAROXABAN (XARELTO) VTE STARTER PACK (15 & 20 MG): ORAL_TABLET | ORAL | 0 refills | Status: DC

## 2017-02-04 MED ORDER — RIVAROXABAN 15 MG PO TABS
15.0000 mg | ORAL_TABLET | Freq: Once | ORAL | Status: AC
  Administered 2017-02-04: 15 mg via ORAL
  Filled 2017-02-04: qty 1

## 2017-02-04 MED FILL — XARELTO STARTER PACK: 15 & 20 | 30 days supply | Qty: 51 | Fill #0

## 2017-02-04 NOTE — ED Triage Notes (Signed)
Patient here from [place] for PE. Has had ongoing shortness of breath since pneumonia 2 weeks ago and today CT showed PE. No chest pain, alert and oriented

## 2017-02-04 NOTE — Assessment & Plan Note (Addendum)
Body mass index is 33.88   trending up Lab Results  Component Value Date   TSH 1.24 02/03/2017     Contributing to GERD/  dvt/pe risk/ doe/reviewed the need and the process to achieve and maintain neg calorie balance > defer f/u primary care including intermittently monitoring thyroid status

## 2017-02-04 NOTE — ED Notes (Signed)
Patient transported to X-ray 

## 2017-02-04 NOTE — Discharge Instructions (Signed)
Start taking xarelto for your pulmonary embolisms. Stop taking aspirin since this can increase your risk of bleeding. Follow up with your regular doctor and pulmonologist in 3-5 days for ongoing management and evaluation of your blood clots and shortness of breath. Return to the ER for emergent changes or worsening symptoms.

## 2017-02-04 NOTE — Progress Notes (Signed)
Pt aware.

## 2017-02-04 NOTE — ED Notes (Signed)
Hospitalist at bedside 

## 2017-02-04 NOTE — Telephone Encounter (Signed)
Referred to ER to start RX

## 2017-02-04 NOTE — Assessment & Plan Note (Addendum)
02/03/2017  Walked RA x 3 laps @ 185 ft each stopped due to  End of study, nl pace,  Min sob/no desat   - legs weak  - Spirometry 02/03/2017  No obstruction  3 h p last saba   Symptoms are markedly disproportionate to objective findings and not clear this is a lung problem but pt does appear to have difficult airway management issues.DDX of  difficult airways management almost all start with A and  include Adherence, Ace Inhibitors, Acid Reflux, Active Sinus Disease, Alpha 1 Antitripsin deficiency, Anxiety masquerading as Airways dz,  ABPA,  Allergy(esp in young), Aspiration (esp in elderly), Adverse effects of meds,  Active smokers, A bunch of PE's (a small clot burden can't cause this syndrome unless there is already severe underlying pulm or vascular dz with poor reserve) plus two Bs  = Bronchiectasis and Beta blocker use..and one C= CHF  Adherence is always the initial "prime suspect" and is a multilayered concern that requires a "trust but verify" approach in every patient - starting with knowing how to use medications, especially inhalers, correctly, keeping up with refills and understanding the fundamental difference between maintenance and prns vs those medications only taken for a very short course and then stopped and not refilled.   ? Acid (or non-acid) GERD > always difficult to exclude as up to 75% of pts in some series report no assoc GI/ Heartburn symptoms> rec max (24h)  acid suppression and diet restrictions/ reviewed and instructions given in writing.   ? Allergy/a asthma > very unlikely, on to just use saba prn   ? A bunch of PE's >  Only abnormality is pos d dimer which doesn't fit with hx but needs to be explored so needs CTa and Venous dopplers if CTa is neg   ? Anxiety/depression  > usually at the bottom of this list of usual suspects but should be much higher on this pt's based on H and P and note already on psychotropics .   ? chf > excluded by bnp << 100  I had an extended  discussion with the patient reviewing all relevant studies completed to date and  lasting 25 minutes of a 40  minute acute office visit for new/refractory  non-specific but potentially very serious refractory respiratory symptoms of unknown etiology.  Each maintenance medication was reviewed in detail including most importantly the difference between maintenance and prns and under what circumstances the prns are to be triggered using an action plan format that is not reflected in the computer generated alphabetically organized AVS.    Please see AVS for specific instructions unique to this office visit that I personally wrote and verbalized to the the pt in detail and then reviewed with pt  by my nurse highlighting any changes in therapy/plan of care  recommended at today's visit.

## 2017-02-04 NOTE — ED Notes (Signed)
ED Provider at bedside. 

## 2017-02-04 NOTE — ED Notes (Signed)
Per ED pharmacy, hold heparin for pharmacy consult with ED physician.

## 2017-02-04 NOTE — Telephone Encounter (Signed)
Spoke with Levander Campion at [facility]. Pt's CT Angio is back and is positive for bilateral PE.  MW - please advise. Thanks.
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
## 2017-02-06 ENCOUNTER — Ambulatory Visit (INDEPENDENT_AMBULATORY_CARE_PROVIDER_SITE_OTHER): Payer: PPO | Admitting: Family Medicine

## 2017-02-06 ENCOUNTER — Encounter: Payer: Self-pay | Admitting: Family Medicine

## 2017-02-06 VITALS — BP 118/70 | HR 80 | Temp 97.9°F | Resp 16 | Ht 69.0 in | Wt 229.6 lb

## 2017-02-06 DIAGNOSIS — E785 Hyperlipidemia, unspecified: Secondary | ICD-10-CM

## 2017-02-06 DIAGNOSIS — E876 Hypokalemia: Secondary | ICD-10-CM

## 2017-02-06 DIAGNOSIS — E1151 Type 2 diabetes mellitus with diabetic peripheral angiopathy without gangrene: Secondary | ICD-10-CM

## 2017-02-06 DIAGNOSIS — I10 Essential (primary) hypertension: Secondary | ICD-10-CM

## 2017-02-06 DIAGNOSIS — I2699 Other pulmonary embolism without acute cor pulmonale: Secondary | ICD-10-CM

## 2017-02-06 MED ORDER — POTASSIUM CHLORIDE CRYS ER 20 MEQ PO TBCR: 20.0000 meq | EXTENDED_RELEASE_TABLET | Freq: Every day | ORAL | 3 refills | Status: DC

## 2017-02-06 MED FILL — POTASSIUM CL ER 20 MEQ TAB: 20 | 30 days supply | Qty: 30 | Fill #0

## 2017-02-06 NOTE — Patient Instructions (Addendum)
Come for lab only in about 4 weeks.  Pulmonary Embolism A pulmonary embolism (PE) is a sudden blockage or decrease of blood flow in one lung or both lungs. Most blockages come from a blood clot that travels from the legs or the pelvis to the lungs. PE is a dangerous and potentially life-threatening condition if it is not treated right away. What are the causes? A pulmonary embolism occurs most commonly when a blood clot travels from one of your veins to your lungs. Rarely, PE is caused by air, fat, amniotic fluid, or part of a tumor traveling through your veins to your lungs. What increases the risk? A PE is more likely to develop in:  People who smoke.  People who areolder, especially over 15 years of age.  People who are overweight (obese).  People who sit or lie still for a long time, such as during long-distance travel (over 4 hours), bed rest, hospitalization, or during recovery from certain medical conditions like a stroke.  People who do not engage in much physical activity (sedentary lifestyle).  People who have chronic breathing disorders.  People whohave a personal or family history of blood clots or blood clotting disease.  People whohave peripheral vascular disease (PVD), diabetes, or some types of cancer.  People who haveheart disease, especially if the person had a recent heart attack or has congestive heart failure.  People who have neurological diseases that affect the legs (leg paresis).  People who have had a traumatic injury, such as breaking a hip or leg.  People whohave recently had major or lengthy surgery, especially on the hip, knee, or abdomen.  People who have hada central line placed inside a large vein.  People who takemedicines that contain the hormone estrogen. These include birth control pills and hormone replacement therapy.  Pregnancy or during childbirth or the postpartum period. What are the signs or symptoms? The symptoms of a PE  usually start suddenly and include:  Shortness of breath while active or at rest.  Coughing or coughing up blood or blood-tinged mucus.  Chest pain that is often worse with deep breaths.  Rapid or irregular heartbeat.  Feeling light-headed or dizzy.  Fainting.  Feelinganxious.  Sweating. There may also be pain and swelling in a leg if that is where the blood clot started. These symptoms may represent a serious problem that is an emergency. Do not wait to see if the symptoms will go away. Get medical help right away. Call your local emergency services (911 in the [place] Do not drive yourself to the hospital.  How is this diagnosed? Your health care provider will take a medical history and perform a physical exam. You may also have other tests, including:  Blood tests to assess the clotting properties of your blood, assess oxygen levels in your blood, and find blood clots.  Imaging tests, such as CT, ultrasound, MRI, X-ray, and other tests to see if you have clots anywhere in your body.  An electrocardiogram (ECG) to look for heart strain from blood clots in the lungs. How is this treated? The main goals of PE treatment are:  To stop a blood clot from growing larger.  To stop new blood clots from forming. The type of treatment that you receive depends on many factors, such as the cause of your PE, your risk for bleeding or developing more clots, and other medical conditions that you have. Sometimes, a combination of treatments is necessary. This condition may be treated with:  Medicines, including newer oral blood thinners (anticoagulants), warfarin, low molecular weight heparins, thrombolytics, or heparins.  Wearing compression stockings or using different types of devices.  Surgery (rare) to remove the blood clot or to place a filter in your abdomen to stop the blood clot from traveling to your lungs. Treatments for a PE are often divided into immediate treatment, long-term  treatment (up to 3 months after PE), and extended treatment (more than 3 months after PE). Your treatment may continue for several months. This is called maintenance therapy, and it is used to prevent the forming of new blood clots. You can work with your health care provider to choose the treatment program that is best for you. What are anticoagulants?  Anticoagulants are medicines that treat PEs. They can stop current blood clots from growing and stop new clots from forming. They cannot dissolve existing clots. Your body dissolves clots by itself over time. Anticoagulants are given by mouth, by injection, or through an IV tube. What are thrombolytics?  Thrombolytics are clot-dissolving medicines that are used to dissolve a PE. They carry a high risk of bleeding, so they tend to be used only in severe cases or if you have very low blood pressure. Follow these instructions at home: If you are taking a newer oral anticoagulant:  Take the medicine every single day at the same time each day.  Understand what foods and drugs interact with this medicine.  Understand that there are no regular blood tests required when using this medicine.  Understandthe side effects of this medicine, including excessive bruising or bleeding. Ask your health care provider or pharmacist about other possible side effects. If you are taking warfarin:  Understand how to take warfarin and know which foods can affect how warfarin works in Veterinary surgeon.  Understand that it is dangerous to taketoo much or too little warfarin. Too much warfarin increases the risk of bleeding. Too little warfarin continues to allow the risk for blood clots.  Follow your PT and INR blood testing schedule. The PT and INR results allow your health care provider to adjust your dose of warfarin. It is very important that you have your PT and INR tested as often as told by your health care provider.  Avoid major changes in your diet, or tell your  health care provider before you change your diet. Arrange a visit with a registered dietitian to answer your questions. Many foods, especially foods that are high in vitamin K, can interfere with warfarin and affect the PT and INR results. Eat a consistent amount of foods that are high in vitamin K, such as:  Spinach, kale, broccoli, cabbage, collard greens, turnip greens, Brussels sprouts, peas, cauliflower, seaweed, and parsley.  Beef liver and pork liver.  Green tea.  Soybean oil.  Tell your health care provider about any and all medicines, vitamins, and supplements that you take, including aspirin and other over-the-counter anti-inflammatory medicines. Be especially cautious with aspirin and anti-inflammatory medicines. Do not take those before you ask your health care provider if it is safe to do so. This is important because many medicines can interfere with warfarin and affect the PT and INR results.  Do not start or stop taking any over-the-counter or prescription medicine unless your health care provider or pharmacist tells you to do so. If you take warfarin, you will also need to do these things:  Hold pressure over cuts for longer than usual.  Tell your dentist and other health care providers that  you are taking warfarin before you have any procedures in which bleeding may occur.  Avoid alcohol or drink very small amounts. Tell your health care provider if you change your alcohol intake.  Do not use tobacco products, including cigarettes, chewing tobacco, and e-cigarettes. If you need help quitting, ask your health care provider.  Avoid contact sports. General instructions  Take over-the-counter and prescription medicines only as told by your health care provider. Anticoagulant medicines can have side effects, including easy bruising and difficulty stopping bleeding. If you are prescribed an anticoagulant, you will also need to do these things:  Hold pressure over cuts for  longer than usual.  Tell your dentist and other health care providers that you are taking anticoagulants before you have any procedures in which bleeding may occur.  Avoid contact sports.  Wear a medical alert bracelet or carry a medical alert card that says you have had a PE.  Ask your health care provider how soon you can go back to your normal activities. Stay active to prevent new blood clots from forming.  Make sure to exercise while traveling or when you have been sitting or standing for a long period of time. It is very important to exercise. Exercise your legs by walking or by tightening and relaxing your leg muscles often. Take frequent walks.  Wear compression stockings as told by your health care provider to help prevent more blood clots from forming.  Do not use tobacco products, including cigarettes, chewing tobacco, and e-cigarettes. If you need help quitting, ask your health care provider.  Keep all follow-up appointments with your health care provider. This is important. How is this prevented? Take these actions to decrease your risk of developing another PE:  Exercise regularly. For at least 30 minutes every day, engage in:  Activity that involves moving your arms and legs.  Activity that encourages good blood flow through your body by increasing your heart rate.  Exercise your arms and legs every hour during long-distance travel (over 4 hours). Drink plenty of water and avoid drinking alcohol while traveling.  Avoid sitting or lying in bed for long periods of time without moving your legs.  Maintain a weight that is appropriate for your height. Ask your health care provider what weight is healthy for you.  If you are a woman who is over 53 years of age, avoid unnecessary use of medicines that contain estrogen. These include birth control pills.  Do not smoke, especially if you take estrogen medicines. If you need help quitting, ask your health care provider.  If  you are at very high risk for PE, wear compression stockings.  If you recently had a PE, have regularly scheduled ultrasound testing on your legs to check for new blood clots. If you are hospitalized, prevention measures may include:  Early walking after surgery, as soon as your health care provider says that it is safe.  Receiving anticoagulants to prevent blood clots. If you cannot take anticoagulants, other options may be available, such as wearing compression stockings or using different types of devices. Get help right away if:  You have new or increased pain, swelling, or redness in an arm or leg.  You have numbness or tingling in an arm or leg.  You have shortness of breath while active or at rest.  You have chest pain.  You have a rapid or irregular heartbeat.  You feel light-headed or dizzy.  You cough up blood.  You notice blood in your  vomit, bowel movement, or urine.  You have a fever. These symptoms may represent a serious problem that is an emergency. Do not wait to see if the symptoms will go away. Get medical help right away. Call your local emergency services (911 in the [place] Do not drive yourself to the hospital.  This information is not intended to replace advice given to you by your health care provider. Make sure you discuss any questions you have with your health care provider. Document Released: 12/06/2000 Document Revised: 05/16/2016 Document Reviewed: 04/05/2015 Elsevier Interactive Patient Education  2017 [employer].
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
## 2017-02-06 NOTE — Progress Notes (Signed)
Pre visit review using our clinic review tool, if applicable. No additional management support is needed unless otherwise documented below in the visit note. 

## 2017-02-09 DIAGNOSIS — I2699 Other pulmonary embolism without acute cor pulmonale: Secondary | ICD-10-CM

## 2017-02-09 HISTORY — DX: Other pulmonary embolism without acute cor pulmonale: I26.99

## 2017-02-09 NOTE — Assessment & Plan Note (Signed)
F/u pulmonary con't xaralto Refer to hematology per hosp d/c for etiology

## 2017-02-10 ENCOUNTER — Encounter: Payer: Self-pay | Admitting: Internal Medicine

## 2017-02-10 ENCOUNTER — Ambulatory Visit (INDEPENDENT_AMBULATORY_CARE_PROVIDER_SITE_OTHER): Payer: PPO | Admitting: Internal Medicine

## 2017-02-10 VITALS — BP 118/80 | HR 71 | Ht 69.0 in | Wt 230.0 lb

## 2017-02-10 DIAGNOSIS — I2699 Other pulmonary embolism without acute cor pulmonale: Secondary | ICD-10-CM | POA: Diagnosis not present

## 2017-02-10 DIAGNOSIS — R0609 Other forms of dyspnea: Secondary | ICD-10-CM | POA: Diagnosis not present

## 2017-02-10 DIAGNOSIS — R918 Other nonspecific abnormal finding of lung field: Secondary | ICD-10-CM

## 2017-02-10 NOTE — Progress Notes (Signed)
Subjective:    Patient ID: Garrett [name], male    DOB: December 22, 1951,   MRN: [id number]   Brief patient profile:  70 yowm quit smoking 1992 and exposed to dust in [place] until age of 1  no respiratory problems except for OSA until fall 2016 with recurrent coughing/ sob/ wheezing since then already rx with Symbicort/ saba so referred to pulmonary clinic 03/21/2016 by Dr Etter Sjogren with abn ct chest.   History of Present Illness  03/21/2016 1st [facility] office visit/ [facility]  On maint symbicort 160  Chief Complaint  Patient presents with  . Pulmonary Consult    Referred by Dr. Etter Sjogren. Pt states he had PNA recently and has had cough and SOB for the past few months- esp worse over the past 6 wks. He gets SOB walking short distances such as to his mailbox. His cough is prod at times with yellow/green sputum.    prior to 6 m prior to OV  Back and forth mb fine/ no need for any inhalers but breathing never right since, best rx is prednisone no def resp to symbicort or saba  rec Stop Altace and start avapro  150 mg  One half daily - take a whole if blood pressure too high Pantoprazole Take 30- 60 min before your first and last meals of the day  GERD diet  If get worse > Prednisone 10 mg take  4 each am x 2 days,   2 each am x 2 days,  1 each am x 2 days and stop     05/02/2016  f/u ov/Wert re:  prob acei uacs / mpns  Chief Complaint  Patient presents with  . Follow-up    Breathing is much improved- almost back to his normal baseline. He is no longer coughing. Has not had to use albuterol.    improved p 2 weeks off acei and now Not limited by breathing from desired activities   rec We will call you for follow up ct in Sept 2017> no change in old nodules, one new only 6 mm > rec 12 m f/u     02/03/2017  Acute extended  ov/Wert re: new sob  Chief Complaint  Patient presents with  . Follow-up    upper airway cough syndrome, not having much cough but still struggles with dyspnea with exertion    acute ill on a 01/17/17  with fever chills and sob and minimal cough dry > UC same Sunday 01/19/17 Rocephin /levaquin 500 x 10 days and pred rec  Then doxy still has one day left but breathing but sob x 50 fts  Baseline = slower than nl = MMRC2 = can't walk a nl pace on a flat grade s sob but does fine slow and flat eg walking at mall  Not on any kind of regular use inhaler - symbicort listed but not taking Now on neb and it too does not really help his sob Taking ppi but not ac       No noct symptoms at all  rec Please remember to go to the lab   department downstairs in the basement  for your tests - we will call you with the results when they are available. Protonix Take 30- 60 min before your first and last meals of the day  GERD diet  CTa 02/04/17 > pos PE > xarelto     02/10/2017  Post hosp f/u ov/transition of care / [facility]ert re: doe / dx of  PE/ not using symb very rare need for saba/ never neb  Chief Complaint  Patient presents with  . Follow-up    F/u for CT results. Breathing has been getting better since last OV.   Not limited by breathing from desired activities  But Relatively sedentary.  No obvious day to day or daytime variability or assoc excess/ purulent sputum or mucus plugs or hemoptysis or cp or chest tightness, subjective wheeze or overt sinus or hb symptoms. No unusual exp hx or h/o childhood pna/ asthma or knowledge of premature birth.  Sleeping ok without nocturnal  or early am exacerbation  of respiratory  c/o's or need for noct saba. Also denies any obvious fluctuation of symptoms with weather or environmental changes or other aggravating or alleviating factors except as outlined above   Current Medications, Allergies, Complete Past Medical History, Past Surgical History, Family History, and Social History were reviewed in [employer] record.  ROS  The following are not active complaints unless bolded sore throat, dysphagia, dental problems,  itching, sneezing,  nasal congestion or excess/ purulent secretions, ear ache,   fever, chills, sweats, unintended wt loss, classically pleuritic or exertional cp,  orthopnea pnd or leg swelling, presyncope, palpitations, abdominal pain, anorexia, nausea, vomiting, diarrhea  or change in bowel or bladder habits, change in stools or urine, dysuria,hematuria,  rash, arthralgias, visual complaints, headache, numbness, weakness or ataxia or problems with walking or coordination,  change in mood/affect or memory.                 Objective:   Physical Exam  amb wm  nad    02/10/2017        230  02/03/2017        229  05/02/2016        226  03/21/16 226 lb 3.2 oz (102.604 kg)  03/11/16 222 lb 6.4 oz (100.88 kg)  03/06/16 229 lb (103.874 kg)    Vital signs reviewed  - Note on arrival 02 sats  98% on RA    HEENT: nl   turbinates, and oropharynx. Nl external ear canals without cough reflex - full set  Of dentures   NECK :  without JVD/Nodes/TM/ nl carotid upstrokes bilaterally   LUNGS: no acc muscle use,  Nl contour chest which is clear to A and P bilaterally without cough on insp or exp maneuvers   CV:  RRR  no s3 or murmur or increase in P2, no edema   ABD:  soft and nontender with nl inspiratory excursion in the supine position. No bruits or organomegaly, bowel sounds nl  MS:  Nl gait/ ext warm without deformities, calf tenderness, cyanosis or clubbing No obvious joint restrictions   SKIN: warm and dry without lesions    NEURO:  alert, approp, nl sensorium with  no motor deficits                  Assessment & Plan:

## 2017-02-10 NOTE — Patient Instructions (Addendum)
Only use your albuterol as a rescue medication to be used if you can't catch your breath by resting or doing a relaxed purse lip breathing pattern.  - The less you use it, the better it will work when you need it. - Don't leave home without it !!  (think of it like the spare tire for your car)  - Ok to use up to 2 puffs  every 4 hours  But if you start needing it for any reason more than you do now then you need to restart regular use of symbicort 80 Take 2 puffs first thing in am and then another 2 puffs about 12 hours later.    Please schedule a follow up visit in 6  months but call sooner if needed

## 2017-02-11 ENCOUNTER — Encounter: Payer: Self-pay | Admitting: Family Medicine

## 2017-02-11 NOTE — Assessment & Plan Note (Signed)
Body mass index is 33.97    trending up slightly  Lab Results  Component Value Date   TSH 1.24 02/03/2017     Contributing to dvt/pe  risk/ doe/reviewed the need and the process to achieve and maintain neg calorie balance > defer f/u primary care including intermittently monitoring thyroid status

## 2017-02-11 NOTE — Assessment & Plan Note (Signed)
See CT chest 03/15/16   - 09/16/2016 Primarily similar bilateral pulmonary nodules measuring maximally 8 mm. A left upper lobe pulmonary nodule measures 6 mm and is new. Given the new nodule and smoking history, followup at 6-12 months> REC F/U IN 09/16/17 as quit smoking 1992   - CTa chest 02/04/17 > Multiple stable pulmonary nodules. Two new adjacent nodules in the left lobe are likely inflammatory.>  Already and reminder file  For 09/18/17 follow-up  Discussed in detail all the  indications, usual  risks and alternatives  relative to the benefits with patient who agrees to proceed with conservative f/u as outlined

## 2017-02-11 NOTE — Assessment & Plan Note (Addendum)
02/03/2017  Walked RA x 3 laps @ 185 ft each stopped due to  End of study, nl pace,  Min sob/no desat   - legs weak  - Spirometry 02/03/2017  3 h p saba  wnl including curvature of f/v loop   In retrospect is not clear whether he has asthma(but certainly does not have copd)  at all here and is fine to go back to using rescue therapy and if he breaks the rule of twos and he needs to be on a maintenance program of Symbicort 80 2bid   I had an extended discussion with the patient reviewing all relevant studies completed to date and  lasting 25 minutes of a 40  minute post hosp f/u visit transition of care re potentially very serious and previously  refractory respiratory symptoms of unknown etiology.  Each maintenance medication was reviewed in detail including most importantly the difference between maintenance and prns and under what circumstances the prns are to be triggered using an action plan format that is not reflected in the computer generated alphabetically organized AVS.    Please see AVS for specific instructions unique to this office visit that I personally wrote and verbalized to the the pt in detail and then reviewed with pt  by my nurse highlighting any changes in therapy/plan of care  recommended at today's visit.

## 2017-02-11 NOTE — Assessment & Plan Note (Signed)
Needs a minimum of 6 m rx per PCP and f/u here in 6 m  Discussed in detail all the  indications, usual  risks and alternatives  relative to the benefits with patient who agrees to proceed with rx with xarelto

## 2017-02-13 ENCOUNTER — Encounter: Payer: Self-pay | Admitting: Family Medicine

## 2017-02-13 NOTE — Telephone Encounter (Signed)
Need more details If only stuffy head / nose etc--- flonase, antihistamine (xyazl, zyrtec, allegra) Ov if no better

## 2017-02-19 ENCOUNTER — Ambulatory Visit (HOSPITAL_COMMUNITY)
Admission: RE | Admit: 2017-02-19 | Discharge: 2017-02-19 | Disposition: A | Discharge status: Home / Self Care | Payer: PPO | Source: Ambulatory Visit | Attending: Cardiovascular Disease | Admitting: Cardiovascular Disease

## 2017-02-19 ENCOUNTER — Other Ambulatory Visit: Payer: Self-pay | Admitting: Internal Medicine

## 2017-02-19 DIAGNOSIS — Z7901 Long term (current) use of anticoagulants: Secondary | ICD-10-CM | POA: Insufficient documentation

## 2017-02-19 DIAGNOSIS — E119 Type 2 diabetes mellitus without complications: Secondary | ICD-10-CM | POA: Diagnosis not present

## 2017-02-19 DIAGNOSIS — Z86711 Personal history of pulmonary embolism: Secondary | ICD-10-CM | POA: Insufficient documentation

## 2017-02-19 DIAGNOSIS — M7989 Other specified soft tissue disorders: Secondary | ICD-10-CM

## 2017-02-19 DIAGNOSIS — Z87891 Personal history of nicotine dependence: Secondary | ICD-10-CM | POA: Diagnosis not present

## 2017-02-19 DIAGNOSIS — R0609 Other forms of dyspnea: Secondary | ICD-10-CM

## 2017-02-19 DIAGNOSIS — I10 Essential (primary) hypertension: Secondary | ICD-10-CM | POA: Insufficient documentation

## 2017-02-19 DIAGNOSIS — Z8701 Personal history of pneumonia (recurrent): Secondary | ICD-10-CM | POA: Diagnosis not present

## 2017-02-20 NOTE — Progress Notes (Signed)
Spoke with pt and notified of results per Dr. Wert. Pt verbalized understanding and denied any questions. 

## 2017-02-26 ENCOUNTER — Encounter: Payer: Self-pay | Admitting: Family Medicine

## 2017-02-26 ENCOUNTER — Other Ambulatory Visit: Payer: Self-pay | Admitting: Family Medicine

## 2017-02-26 ENCOUNTER — Other Ambulatory Visit: Payer: Self-pay

## 2017-02-26 DIAGNOSIS — I10 Essential (primary) hypertension: Secondary | ICD-10-CM

## 2017-02-26 DIAGNOSIS — R059 Cough, unspecified: Secondary | ICD-10-CM

## 2017-02-26 DIAGNOSIS — R05 Cough: Secondary | ICD-10-CM

## 2017-02-26 MED ORDER — IRBESARTAN 150 MG PO TABS: 150.0000 mg | ORAL_TABLET | Freq: Every day | ORAL | 0 refills | Status: DC

## 2017-02-26 MED FILL — IRBESARTAN 150 MG TABLET: 150 | 90 days supply | Qty: 90 | Fill #0

## 2017-02-26 MED FILL — PRAVASTATIN NA 10 MG TAB: 10 | 90 days supply | Qty: 90 | Fill #1

## 2017-02-27 ENCOUNTER — Other Ambulatory Visit: Payer: Self-pay | Admitting: Family Medicine

## 2017-02-27 DIAGNOSIS — R059 Cough, unspecified: Secondary | ICD-10-CM

## 2017-02-27 DIAGNOSIS — R05 Cough: Secondary | ICD-10-CM

## 2017-02-27 MED ORDER — RIVAROXABAN 20 MG PO TABS: 20.0000 mg | ORAL_TABLET | Freq: Every day | ORAL | 1 refills | Status: DC

## 2017-02-27 MED ORDER — LEVOCETIRIZINE DIHYDROCHLORIDE 5 MG PO TABS: 5.0000 mg | ORAL_TABLET | Freq: Every evening | ORAL | 1 refills | Status: DC

## 2017-02-27 MED ORDER — POTASSIUM CHLORIDE CRYS ER 20 MEQ PO TBCR: 20.0000 meq | EXTENDED_RELEASE_TABLET | Freq: Every day | ORAL | 1 refills | Status: DC

## 2017-02-27 NOTE — Telephone Encounter (Signed)
Per rx after 4 weeks take 20 mg daily #90  1 refill

## 2017-02-28 ENCOUNTER — Telehealth: Payer: Self-pay | Admitting: Family Medicine

## 2017-02-28 ENCOUNTER — Inpatient Hospital Stay (HOSPITAL_COMMUNITY)
Admission: EM | Admit: 2017-02-28 | Discharge: 2017-03-03 | DRG: 871 | Disposition: A | Discharge status: Home / Self Care | Payer: PPO | Attending: Internal Medicine | Admitting: Internal Medicine

## 2017-02-28 ENCOUNTER — Emergency Department (HOSPITAL_COMMUNITY): Payer: PPO

## 2017-02-28 ENCOUNTER — Encounter (HOSPITAL_COMMUNITY): Payer: Self-pay | Admitting: Emergency Medicine

## 2017-02-28 DIAGNOSIS — E872 Acidosis: Secondary | ICD-10-CM | POA: Diagnosis present

## 2017-02-28 DIAGNOSIS — E1159 Type 2 diabetes mellitus with other circulatory complications: Secondary | ICD-10-CM | POA: Diagnosis not present

## 2017-02-28 DIAGNOSIS — M1000 Idiopathic gout, unspecified site: Secondary | ICD-10-CM | POA: Diagnosis not present

## 2017-02-28 DIAGNOSIS — J9601 Acute respiratory failure with hypoxia: Secondary | ICD-10-CM | POA: Diagnosis not present

## 2017-02-28 DIAGNOSIS — E785 Hyperlipidemia, unspecified: Secondary | ICD-10-CM

## 2017-02-28 DIAGNOSIS — Z7951 Long term (current) use of inhaled steroids: Secondary | ICD-10-CM

## 2017-02-28 DIAGNOSIS — Z91030 Bee allergy status: Secondary | ICD-10-CM

## 2017-02-28 DIAGNOSIS — E669 Obesity, unspecified: Secondary | ICD-10-CM | POA: Diagnosis present

## 2017-02-28 DIAGNOSIS — G43009 Migraine without aura, not intractable, without status migrainosus: Secondary | ICD-10-CM | POA: Diagnosis not present

## 2017-02-28 DIAGNOSIS — N289 Disorder of kidney and ureter, unspecified: Secondary | ICD-10-CM | POA: Diagnosis not present

## 2017-02-28 DIAGNOSIS — K210 Gastro-esophageal reflux disease with esophagitis: Secondary | ICD-10-CM | POA: Diagnosis present

## 2017-02-28 DIAGNOSIS — Z87891 Personal history of nicotine dependence: Secondary | ICD-10-CM

## 2017-02-28 DIAGNOSIS — F03A0 Unspecified dementia, mild, without behavioral disturbance, psychotic disturbance, mood disturbance, and anxiety: Secondary | ICD-10-CM | POA: Diagnosis present

## 2017-02-28 DIAGNOSIS — F0390 Unspecified dementia without behavioral disturbance: Secondary | ICD-10-CM | POA: Diagnosis present

## 2017-02-28 DIAGNOSIS — F17290 Nicotine dependence, other tobacco product, uncomplicated: Secondary | ICD-10-CM | POA: Diagnosis present

## 2017-02-28 DIAGNOSIS — R079 Chest pain, unspecified: Secondary | ICD-10-CM | POA: Diagnosis not present

## 2017-02-28 DIAGNOSIS — E1169 Type 2 diabetes mellitus with other specified complication: Secondary | ICD-10-CM | POA: Diagnosis not present

## 2017-02-28 DIAGNOSIS — Z833 Family history of diabetes mellitus: Secondary | ICD-10-CM

## 2017-02-28 DIAGNOSIS — Z91018 Allergy to other foods: Secondary | ICD-10-CM

## 2017-02-28 DIAGNOSIS — E1140 Type 2 diabetes mellitus with diabetic neuropathy, unspecified: Secondary | ICD-10-CM | POA: Diagnosis present

## 2017-02-28 DIAGNOSIS — Z86711 Personal history of pulmonary embolism: Secondary | ICD-10-CM

## 2017-02-28 DIAGNOSIS — R0602 Shortness of breath: Secondary | ICD-10-CM

## 2017-02-28 DIAGNOSIS — E1165 Type 2 diabetes mellitus with hyperglycemia: Secondary | ICD-10-CM | POA: Diagnosis not present

## 2017-02-28 DIAGNOSIS — G4733 Obstructive sleep apnea (adult) (pediatric): Secondary | ICD-10-CM | POA: Diagnosis present

## 2017-02-28 DIAGNOSIS — A419 Sepsis, unspecified organism: Secondary | ICD-10-CM | POA: Diagnosis not present

## 2017-02-28 DIAGNOSIS — N179 Acute kidney failure, unspecified: Secondary | ICD-10-CM | POA: Diagnosis present

## 2017-02-28 DIAGNOSIS — M109 Gout, unspecified: Secondary | ICD-10-CM | POA: Diagnosis present

## 2017-02-28 DIAGNOSIS — R918 Other nonspecific abnormal finding of lung field: Secondary | ICD-10-CM | POA: Diagnosis present

## 2017-02-28 DIAGNOSIS — J189 Pneumonia, unspecified organism: Secondary | ICD-10-CM | POA: Diagnosis not present

## 2017-02-28 DIAGNOSIS — R05 Cough: Secondary | ICD-10-CM | POA: Diagnosis not present

## 2017-02-28 DIAGNOSIS — H9193 Unspecified hearing loss, bilateral: Secondary | ICD-10-CM | POA: Diagnosis not present

## 2017-02-28 DIAGNOSIS — M1990 Unspecified osteoarthritis, unspecified site: Secondary | ICD-10-CM | POA: Diagnosis present

## 2017-02-28 DIAGNOSIS — J181 Lobar pneumonia, unspecified organism: Secondary | ICD-10-CM | POA: Diagnosis not present

## 2017-02-28 DIAGNOSIS — Z6833 Body mass index (BMI) 33.0-33.9, adult: Secondary | ICD-10-CM

## 2017-02-28 DIAGNOSIS — E876 Hypokalemia: Secondary | ICD-10-CM | POA: Diagnosis not present

## 2017-02-28 DIAGNOSIS — E1121 Type 2 diabetes mellitus with diabetic nephropathy: Secondary | ICD-10-CM | POA: Diagnosis present

## 2017-02-28 DIAGNOSIS — I2699 Other pulmonary embolism without acute cor pulmonale: Secondary | ICD-10-CM | POA: Diagnosis not present

## 2017-02-28 DIAGNOSIS — Z791 Long term (current) use of non-steroidal anti-inflammatories (NSAID): Secondary | ICD-10-CM

## 2017-02-28 DIAGNOSIS — Z974 Presence of external hearing-aid: Secondary | ICD-10-CM | POA: Diagnosis not present

## 2017-02-28 DIAGNOSIS — I10 Essential (primary) hypertension: Secondary | ICD-10-CM | POA: Diagnosis not present

## 2017-02-28 DIAGNOSIS — Z7901 Long term (current) use of anticoagulants: Secondary | ICD-10-CM

## 2017-02-28 DIAGNOSIS — J45909 Unspecified asthma, uncomplicated: Secondary | ICD-10-CM | POA: Diagnosis present

## 2017-02-28 LAB — URINALYSIS, ROUTINE W REFLEX MICROSCOPIC
Bilirubin Urine: NEGATIVE
GLUCOSE, UA: NEGATIVE mg/dL
HGB URINE DIPSTICK: NEGATIVE
Ketones, ur: NEGATIVE mg/dL
Leukocytes, UA: NEGATIVE
Nitrite: NEGATIVE
PH: 5 (ref 5.0–8.0)
Protein, ur: NEGATIVE mg/dL
SPECIFIC GRAVITY, URINE: 1.004 — AB (ref 1.005–1.030)

## 2017-02-28 LAB — COMPREHENSIVE METABOLIC PANEL
ALK PHOS: 64 U/L (ref 38–126)
ALT: 36 U/L (ref 17–63)
AST: 28 U/L (ref 15–41)
Albumin: 3.5 g/dL (ref 3.5–5.0)
Anion gap: 10 (ref 5–15)
BUN: 15 mg/dL (ref 6–20)
CALCIUM: 9.4 mg/dL (ref 8.9–10.3)
CHLORIDE: 110 mmol/L (ref 101–111)
CO2: 19 mmol/L — AB (ref 22–32)
CREATININE: 1.56 mg/dL — AB (ref 0.61–1.24)
GFR calc Af Amer: 52 mL/min — ABNORMAL LOW (ref >60)
GFR calc non Af Amer: 45 mL/min — ABNORMAL LOW (ref >60)
Glucose, Bld: 263 mg/dL — ABNORMAL HIGH (ref 65–99)
Potassium: 3.4 mmol/L — ABNORMAL LOW (ref 3.5–5.1)
SODIUM: 139 mmol/L (ref 135–145)
Total Bilirubin: 0.7 mg/dL (ref 0.3–1.2)
Total Protein: 6.3 g/dL — ABNORMAL LOW (ref 6.5–8.1)

## 2017-02-28 LAB — PROTIME-INR
INR: 2.68
INR: 2.71
PROTHROMBIN TIME: 29.3 s — AB (ref 11.4–15.2)
Prothrombin Time: 29 seconds — ABNORMAL HIGH (ref 11.4–15.2)

## 2017-02-28 LAB — RESPIRATORY PANEL BY PCR
Adenovirus: NOT DETECTED
BORDETELLA PERTUSSIS-RVPCR: NOT DETECTED
CORONAVIRUS 229E-RVPPCR: NOT DETECTED
CORONAVIRUS OC43-RVPPCR: NOT DETECTED
Chlamydophila pneumoniae: NOT DETECTED
Coronavirus HKU1: NOT DETECTED
Coronavirus NL63: NOT DETECTED
INFLUENZA A-RVPPCR: NOT DETECTED
Influenza B: NOT DETECTED
MYCOPLASMA PNEUMONIAE-RVPPCR: NOT DETECTED
Metapneumovirus: NOT DETECTED
PARAINFLUENZA VIRUS 1-RVPPCR: NOT DETECTED
PARAINFLUENZA VIRUS 4-RVPPCR: NOT DETECTED
Parainfluenza Virus 2: NOT DETECTED
Parainfluenza Virus 3: NOT DETECTED
Respiratory Syncytial Virus: NOT DETECTED
Rhinovirus / Enterovirus: NOT DETECTED

## 2017-02-28 LAB — CBC WITH DIFFERENTIAL/PLATELET
Basophils Absolute: 0 10*3/uL (ref 0.0–0.1)
Basophils Relative: 0 %
Eosinophils Absolute: 0 10*3/uL (ref 0.0–0.7)
Eosinophils Relative: 0 %
HEMATOCRIT: 39.7 % (ref 39.0–52.0)
Hemoglobin: 13.1 g/dL (ref 13.0–17.0)
LYMPHS ABS: 0.7 10*3/uL (ref 0.7–4.0)
LYMPHS PCT: 4 %
MCH: 30.3 pg (ref 26.0–34.0)
MCHC: 33 g/dL (ref 30.0–36.0)
MCV: 91.9 fL (ref 78.0–100.0)
MONO ABS: 0.9 10*3/uL (ref 0.1–1.0)
MONOS PCT: 5 %
NEUTROS ABS: 16.9 10*3/uL — AB (ref 1.7–7.7)
Neutrophils Relative %: 91 %
Platelets: 217 10*3/uL (ref 150–400)
RBC: 4.32 MIL/uL (ref 4.22–5.81)
RDW: 13.8 % (ref 11.5–15.5)
WBC: 18.6 10*3/uL — ABNORMAL HIGH (ref 4.0–10.5)

## 2017-02-28 LAB — GLUCOSE, CAPILLARY
GLUCOSE-CAPILLARY: 131 mg/dL — AB (ref 65–99)
Glucose-Capillary: 169 mg/dL — ABNORMAL HIGH (ref 65–99)

## 2017-02-28 LAB — EXPECTORATED SPUTUM ASSESSMENT W REFEX TO RESP CULTURE

## 2017-02-28 LAB — INFLUENZA PANEL BY PCR (TYPE A & B)
Influenza A By PCR: NEGATIVE
Influenza B By PCR: NEGATIVE

## 2017-02-28 LAB — I-STAT CG4 LACTIC ACID, ED
Lactic Acid, Venous: 3.97 mmol/L (ref 0.5–1.9)
Lactic Acid, Venous: 4.24 mmol/L (ref 0.5–1.9)

## 2017-02-28 LAB — MRSA PCR SCREENING: MRSA by PCR: NEGATIVE

## 2017-02-28 LAB — LACTIC ACID, PLASMA
LACTIC ACID, VENOUS: 1.4 mmol/L (ref 0.5–1.9)
Lactic Acid, Venous: 2.3 mmol/L (ref 0.5–1.9)

## 2017-02-28 LAB — STREP PNEUMONIAE URINARY ANTIGEN: STREP PNEUMO URINARY ANTIGEN: NEGATIVE

## 2017-02-28 LAB — EXPECTORATED SPUTUM ASSESSMENT W GRAM STAIN, RFLX TO RESP C

## 2017-02-28 LAB — APTT: aPTT: 53 seconds — ABNORMAL HIGH (ref 24–36)

## 2017-02-28 LAB — PROCALCITONIN: PROCALCITONIN: 4.08 ng/mL

## 2017-02-28 MED ORDER — SODIUM CHLORIDE 0.9 % IV BOLUS (SEPSIS)
3000.0000 mL | Freq: Once | INTRAVENOUS | Status: AC
  Administered 2017-02-28: 3000 mL via INTRAVENOUS

## 2017-02-28 MED ORDER — SODIUM CHLORIDE 0.9 % IV BOLUS (SEPSIS)
500.0000 mL | Freq: Once | INTRAVENOUS | Status: AC
  Administered 2017-02-28: 500 mL via INTRAVENOUS

## 2017-02-28 MED ORDER — MEMANTINE HCL 10 MG PO TABS
20.0000 mg | ORAL_TABLET | Freq: Every day | ORAL | Status: DC
  Administered 2017-03-01 – 2017-03-03 (×3): 20 mg via ORAL
  Filled 2017-02-28 (×3): qty 2

## 2017-02-28 MED ORDER — IPRATROPIUM-ALBUTEROL 0.5-2.5 (3) MG/3ML IN SOLN
3.0000 mL | Freq: Four times a day (QID) | RESPIRATORY_TRACT | Status: DC
  Administered 2017-02-28 (×2): 3 mL via RESPIRATORY_TRACT
  Filled 2017-02-28 (×2): qty 3

## 2017-02-28 MED ORDER — GABAPENTIN 300 MG PO CAPS
300.0000 mg | ORAL_CAPSULE | Freq: Every day | ORAL | Status: DC
  Administered 2017-02-28 – 2017-03-02 (×3): 300 mg via ORAL
  Filled 2017-02-28 (×3): qty 1

## 2017-02-28 MED ORDER — POTASSIUM CHLORIDE CRYS ER 20 MEQ PO TBCR
20.0000 meq | EXTENDED_RELEASE_TABLET | Freq: Every day | ORAL | Status: DC
  Administered 2017-03-01 – 2017-03-02 (×2): 20 meq via ORAL
  Filled 2017-02-28 (×2): qty 1

## 2017-02-28 MED ORDER — POLYETHYLENE GLYCOL 3350 17 G PO PACK: 17.0000 g | PACK | Freq: Every day | ORAL | Status: DC | PRN

## 2017-02-28 MED ORDER — VERAPAMIL HCL ER 240 MG PO TBCR: 240.0000 mg | EXTENDED_RELEASE_TABLET | Freq: Every day | ORAL | Status: DC

## 2017-02-28 MED ORDER — TOPIRAMATE 25 MG PO TABS
25.0000 mg | ORAL_TABLET | Freq: Two times a day (BID) | ORAL | Status: DC
  Administered 2017-02-28: 50 mg via ORAL
  Filled 2017-02-28 (×2): qty 2

## 2017-02-28 MED ORDER — RISAQUAD PO CAPS
1.0000 | ORAL_CAPSULE | Freq: Every day | ORAL | Status: DC
  Administered 2017-03-01 – 2017-03-03 (×3): 1 via ORAL
  Filled 2017-02-28 (×3): qty 1

## 2017-02-28 MED ORDER — SODIUM CHLORIDE 0.9 % IV BOLUS (SEPSIS)
1000.0000 mL | Freq: Once | INTRAVENOUS | Status: AC
  Administered 2017-02-28: 1000 mL via INTRAVENOUS

## 2017-02-28 MED ORDER — TERAZOSIN HCL 5 MG PO CAPS
5.0000 mg | ORAL_CAPSULE | Freq: Every day | ORAL | Status: DC
  Administered 2017-02-28 – 2017-03-02 (×3): 5 mg via ORAL
  Filled 2017-02-28 (×4): qty 1

## 2017-02-28 MED ORDER — IPRATROPIUM BROMIDE 0.02 % IN SOLN
0.5000 mg | RESPIRATORY_TRACT | Status: DC | PRN
  Administered 2017-03-02: 0.5 mg via RESPIRATORY_TRACT
  Filled 2017-02-28: qty 2.5

## 2017-02-28 MED ORDER — ONDANSETRON HCL 4 MG PO TABS: 4.0000 mg | ORAL_TABLET | Freq: Four times a day (QID) | ORAL | Status: DC | PRN

## 2017-02-28 MED ORDER — ONDANSETRON HCL 4 MG/2ML IJ SOLN: 4.0000 mg | Freq: Four times a day (QID) | INTRAMUSCULAR | Status: DC | PRN

## 2017-02-28 MED ORDER — INSULIN ASPART 100 UNIT/ML ~~LOC~~ SOLN
0.0000 [IU] | Freq: Three times a day (TID) | SUBCUTANEOUS | Status: DC
  Administered 2017-02-28 – 2017-03-01 (×3): 2 [IU] via SUBCUTANEOUS
  Administered 2017-03-02 – 2017-03-03 (×3): 1 [IU] via SUBCUTANEOUS

## 2017-02-28 MED ORDER — ACETAMINOPHEN 500 MG PO TABS
1000.0000 mg | ORAL_TABLET | Freq: Once | ORAL | Status: AC
  Administered 2017-02-28: 1000 mg via ORAL
  Filled 2017-02-28: qty 2

## 2017-02-28 MED ORDER — PIPERACILLIN-TAZOBACTAM 3.375 G IVPB 30 MIN
3.3750 g | Freq: Once | INTRAVENOUS | Status: AC
  Administered 2017-02-28: 3.375 g via INTRAVENOUS
  Filled 2017-02-28: qty 50

## 2017-02-28 MED ORDER — ALLOPURINOL 100 MG PO TABS
100.0000 mg | ORAL_TABLET | Freq: Every day | ORAL | Status: DC
  Administered 2017-03-01 – 2017-03-03 (×3): 100 mg via ORAL
  Filled 2017-02-28 (×3): qty 1

## 2017-02-28 MED ORDER — ACETAMINOPHEN 325 MG PO TABS
650.0000 mg | ORAL_TABLET | Freq: Four times a day (QID) | ORAL | Status: DC | PRN
  Administered 2017-03-02: 650 mg via ORAL
  Filled 2017-02-28: qty 2

## 2017-02-28 MED ORDER — ACETAMINOPHEN 650 MG RE SUPP: 650.0000 mg | Freq: Four times a day (QID) | RECTAL | Status: DC | PRN

## 2017-02-28 MED ORDER — RIVAROXABAN 20 MG PO TABS
20.0000 mg | ORAL_TABLET | Freq: Every day | ORAL | Status: DC
  Administered 2017-03-01 – 2017-03-03 (×3): 20 mg via ORAL
  Filled 2017-02-28 (×3): qty 1

## 2017-02-28 MED ORDER — CETIRIZINE HCL 10 MG PO TABS
10.0000 mg | ORAL_TABLET | Freq: Every evening | ORAL | Status: DC
  Administered 2017-03-01: 10 mg via ORAL
  Filled 2017-02-28 (×3): qty 1

## 2017-02-28 MED ORDER — PIPERACILLIN-TAZOBACTAM 3.375 G IVPB
3.3750 g | Freq: Three times a day (TID) | INTRAVENOUS | Status: DC
  Administered 2017-02-28 – 2017-03-02 (×5): 3.375 g via INTRAVENOUS
  Filled 2017-02-28 (×6): qty 50

## 2017-02-28 MED ORDER — PANTOPRAZOLE SODIUM 40 MG PO TBEC
40.0000 mg | DELAYED_RELEASE_TABLET | Freq: Two times a day (BID) | ORAL | Status: DC
  Administered 2017-02-28 – 2017-03-03 (×6): 40 mg via ORAL
  Filled 2017-02-28 (×6): qty 1

## 2017-02-28 MED ORDER — POTASSIUM CHLORIDE CRYS ER 20 MEQ PO TBCR: 40.0000 meq | EXTENDED_RELEASE_TABLET | Freq: Once | ORAL | Status: DC

## 2017-02-28 MED ORDER — VANCOMYCIN HCL IN DEXTROSE 750-5 MG/150ML-% IV SOLN
750.0000 mg | Freq: Two times a day (BID) | INTRAVENOUS | Status: DC
  Administered 2017-03-01 – 2017-03-02 (×3): 750 mg via INTRAVENOUS
  Filled 2017-02-28 (×4): qty 150

## 2017-02-28 MED ORDER — VANCOMYCIN HCL IN DEXTROSE 1-5 GM/200ML-% IV SOLN
1000.0000 mg | Freq: Once | INTRAVENOUS | Status: AC
  Administered 2017-02-28: 1000 mg via INTRAVENOUS
  Filled 2017-02-28: qty 200

## 2017-02-28 MED ORDER — TRAZODONE HCL 50 MG PO TABS
25.0000 mg | ORAL_TABLET | Freq: Every evening | ORAL | Status: DC | PRN
  Administered 2017-02-28 – 2017-03-02 (×2): 25 mg via ORAL
  Filled 2017-02-28 (×2): qty 1

## 2017-02-28 MED ORDER — PRAVASTATIN SODIUM 20 MG PO TABS
10.0000 mg | ORAL_TABLET | Freq: Every day | ORAL | Status: DC
  Administered 2017-03-01 – 2017-03-03 (×3): 10 mg via ORAL
  Filled 2017-02-28 (×3): qty 1

## 2017-02-28 MED ORDER — VANCOMYCIN HCL IN DEXTROSE 1-5 GM/200ML-% IV SOLN
1000.0000 mg | Freq: Once | INTRAVENOUS | Status: AC
  Administered 2017-02-28: 1000 mg via INTRAVENOUS

## 2017-02-28 NOTE — Telephone Encounter (Signed)
[facility] [place] [facility] - Client [facility]  Patient Name: Garrett [name]  DOB: [birth year]    Initial Comment Caller States her husband has started a new medication, now he is shaking and can barely walk, having shortness of breath, had blood clots in his lungs last month    Nurse Assessment  Nurse: Wynetta Emery, RN, Baker Janus Date/Time (Eastern Time): 02/28/2017 8:46:24 AM  Confirm and document reason for call. If symptomatic, describe symptoms. ---[name] woke up in middle of night shaking whole bed and is short of breath newly diagnosed with blood clots and is on Xarletto he is awake complaining he does not feel well and is short of breath. What do we do.  Does the patient have any new or worsening symptoms? ---Yes  Will a triage be completed? ---No  Select reason for no triage. ---Other  Please document clinical information provided and list any resource used. ---She is not with him -- Nurse advised since he is newly diagnosed with blood clots and on Xarleto he needs to go to ED now.     Guidelines    Guideline Title Affirmed Question Affirmed Notes       Final Disposition User        Referrals  GO TO FACILITY UNDECIDED
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
## 2017-02-28 NOTE — ED Notes (Signed)
Patient up to RR with RN assistance for BM.  Short of breath when ambulating back to bed.  SpO2 98% on monitor.
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
## 2017-02-28 NOTE — ED Notes (Signed)
Patient short of breath sitting in wheelchair and standing and undressing to hospital gown. Slow moving airway intact bilateral equal chest rise and fall. Patient feels warm to touch.

## 2017-02-28 NOTE — Progress Notes (Signed)
Pharmacy Antibiotic Note  Garrett [name] is a 66 y.o. male admitted on 02/28/2017 withsepsis. Presents with SOB. Recent history of flu/PNA/PE - now on Xarelto. Pharmacy has been consulted for vancomycin/zosyn dosing. Afebrile, LA 3.97, WBC 18.6. SCr 1.56 on admit (baseline ~1.0-1.1), CrCl~54.  Plan: Zosyn 3.375g IV (47min inf) x1; then 3.375g IV q8h (4h inf) Vancomycin 2g (1g + 1g) IV x1; then 750mg  IV q12h Monitor clinical progress, c/s, renal function, abx plan/LOT Vancomycin trough as indicated   Height: 5\' 9"  (175.3 cm) Weight: 220 lb (99.8 kg) IBW/kg (Calculated) : 70.7  Temp (24hrs), Avg:99.2 F (37.3 C), Min:99.2 F (37.3 C), Max:99.2 F (37.3 C)   Recent Labs Lab 02/28/17 1049  LATICACIDVEN 3.97*    CrCl cannot be calculated (Patient's most recent lab result is older than the maximum 21 days allowed.).    Allergies  Allergen Reactions  . Bee Venom Anaphylaxis  . Garlic Swelling    Elicia Lamp, PharmD, BCPS Clinical Pharmacist 02/28/2017 11:02 AM

## 2017-02-28 NOTE — Telephone Encounter (Signed)
Called to follow up with patient. Son, Gaspar Bidding answered the phone.  He stated that he was there with his father.  Father is currently getting dressed.  He said father woke up with chills, and stated that he was having trouble taking a deep breath.  He says as soon as father gets dressed, he's taking him to the ER.  Gaspar Bidding said he would call EMS if symptoms worsen.

## 2017-02-28 NOTE — ED Notes (Signed)
Patient arrived to room.

## 2017-02-28 NOTE — Progress Notes (Signed)
CRITICAL VALUE ALERT  Critical value received:  Lactic Acid 2.3  Date of notification: 02/28/17  Time of notification:  2121  Critical value read back: yes  Nurse who received alert:  Candiss Norse RN  MD notified (1st page):  M. Donnal Debar NP  Time of first page:   2128  MD notified (2nd page):  Time of second page:  Responding MD:  M. Donnal Debar NP  Time MD responded:  2148

## 2017-02-28 NOTE — ED Triage Notes (Signed)
Pt here with sudden onset of chills, increased shortness of breath-- had flu/pneumonia/pulmonary emnboli recently. Currently on xeralto. Pt is hypotensive.

## 2017-03-01 DIAGNOSIS — I2699 Other pulmonary embolism without acute cor pulmonale: Secondary | ICD-10-CM

## 2017-03-01 DIAGNOSIS — J181 Lobar pneumonia, unspecified organism: Secondary | ICD-10-CM

## 2017-03-01 DIAGNOSIS — A419 Sepsis, unspecified organism: Principal | ICD-10-CM

## 2017-03-01 DIAGNOSIS — J9601 Acute respiratory failure with hypoxia: Secondary | ICD-10-CM

## 2017-03-01 LAB — CBC WITH DIFFERENTIAL/PLATELET
Basophils Absolute: 0 10*3/uL (ref 0.0–0.1)
Basophils Relative: 0 %
EOS ABS: 0.1 10*3/uL (ref 0.0–0.7)
Eosinophils Relative: 1 %
HEMATOCRIT: 32.7 % — AB (ref 39.0–52.0)
HEMOGLOBIN: 10.6 g/dL — AB (ref 13.0–17.0)
LYMPHS ABS: 2.1 10*3/uL (ref 0.7–4.0)
LYMPHS PCT: 11 %
MCH: 29.9 pg (ref 26.0–34.0)
MCHC: 32.4 g/dL (ref 30.0–36.0)
MCV: 92.1 fL (ref 78.0–100.0)
MONOS PCT: 6 %
Monocytes Absolute: 1 10*3/uL (ref 0.1–1.0)
NEUTROS ABS: 14.8 10*3/uL — AB (ref 1.7–7.7)
NEUTROS PCT: 82 %
Platelets: 171 10*3/uL (ref 150–400)
RBC: 3.55 MIL/uL — AB (ref 4.22–5.81)
RDW: 14.2 % (ref 11.5–15.5)
WBC: 18 10*3/uL — ABNORMAL HIGH (ref 4.0–10.5)

## 2017-03-01 LAB — GLUCOSE, CAPILLARY
GLUCOSE-CAPILLARY: 155 mg/dL — AB (ref 65–99)
GLUCOSE-CAPILLARY: 111 mg/dL — AB (ref 65–99)
Glucose-Capillary: 159 mg/dL — ABNORMAL HIGH (ref 65–99)
Glucose-Capillary: 178 mg/dL — ABNORMAL HIGH (ref 65–99)

## 2017-03-01 LAB — COMPREHENSIVE METABOLIC PANEL
ALK PHOS: 62 U/L (ref 38–126)
ALT: 25 U/L (ref 17–63)
ANION GAP: 8 (ref 5–15)
AST: 20 U/L (ref 15–41)
Albumin: 2.6 g/dL — ABNORMAL LOW (ref 3.5–5.0)
BILIRUBIN TOTAL: 0.7 mg/dL (ref 0.3–1.2)
BUN: 12 mg/dL (ref 6–20)
CALCIUM: 8 mg/dL — AB (ref 8.9–10.3)
CO2: 19 mmol/L — ABNORMAL LOW (ref 22–32)
Chloride: 115 mmol/L — ABNORMAL HIGH (ref 101–111)
Creatinine, Ser: 1.16 mg/dL (ref 0.61–1.24)
Glucose, Bld: 113 mg/dL — ABNORMAL HIGH (ref 65–99)
Potassium: 3.4 mmol/L — ABNORMAL LOW (ref 3.5–5.1)
Sodium: 142 mmol/L (ref 135–145)
TOTAL PROTEIN: 5.3 g/dL — AB (ref 6.5–8.1)

## 2017-03-01 LAB — MAGNESIUM: MAGNESIUM: 1.8 mg/dL (ref 1.7–2.4)

## 2017-03-01 LAB — HIV ANTIBODY (ROUTINE TESTING W REFLEX): HIV Screen 4th Generation wRfx: NONREACTIVE

## 2017-03-01 MED ORDER — FUROSEMIDE 10 MG/ML IJ SOLN
40.0000 mg | Freq: Once | INTRAMUSCULAR | Status: AC
  Administered 2017-03-01: 40 mg via INTRAVENOUS
  Filled 2017-03-01: qty 4

## 2017-03-01 MED ORDER — FUROSEMIDE 10 MG/ML IJ SOLN
20.0000 mg | Freq: Once | INTRAMUSCULAR | Status: AC
  Administered 2017-03-01: 20 mg via INTRAVENOUS
  Filled 2017-03-01: qty 2

## 2017-03-01 MED ORDER — POTASSIUM CHLORIDE IN NACL 40-0.9 MEQ/L-% IV SOLN
INTRAVENOUS | Status: DC
  Administered 2017-03-01: 75 mL/h via INTRAVENOUS
  Filled 2017-03-01: qty 1000

## 2017-03-01 MED ORDER — MAGNESIUM SULFATE 2 GM/50ML IV SOLN
2.0000 g | Freq: Once | INTRAVENOUS | Status: AC
  Administered 2017-03-01: 2 g via INTRAVENOUS
  Filled 2017-03-01: qty 50

## 2017-03-01 MED ORDER — TOPIRAMATE 25 MG PO TABS
50.0000 mg | ORAL_TABLET | Freq: Every day | ORAL | Status: DC
  Administered 2017-03-01 – 2017-03-02 (×2): 50 mg via ORAL
  Filled 2017-03-01: qty 2
  Filled 2017-03-01: qty 1

## 2017-03-01 MED ORDER — TOPIRAMATE 25 MG PO TABS
25.0000 mg | ORAL_TABLET | Freq: Every day | ORAL | Status: DC
  Administered 2017-03-01 – 2017-03-03 (×4): 25 mg via ORAL
  Filled 2017-03-01 (×3): qty 1

## 2017-03-01 MED ORDER — IPRATROPIUM-ALBUTEROL 0.5-2.5 (3) MG/3ML IN SOLN
3.0000 mL | Freq: Two times a day (BID) | RESPIRATORY_TRACT | Status: DC
  Administered 2017-03-01 – 2017-03-03 (×5): 3 mL via RESPIRATORY_TRACT
  Filled 2017-03-01 (×5): qty 3

## 2017-03-01 MED ORDER — SODIUM CHLORIDE 0.9 % IV BOLUS (SEPSIS)
500.0000 mL | Freq: Once | INTRAVENOUS | Status: AC
  Administered 2017-03-01: 500 mL via INTRAVENOUS

## 2017-03-01 NOTE — Progress Notes (Signed)
[facility] PROGRESS NOTE  Garrett [name] YCX:[id number] DOB: 01/16/1951 DOA: 02/28/2017   PCP: Ann Held, DO     Assessment/Plan: Principal Problem:   Sepsis ([place]) Active Problems:   Controlled type 2 diabetes mellitus with diabetic nephropathy ([place])   Gout   Essential hypertension   Migraine headache without aura   Hyperlipidemia associated with type 2 diabetes mellitus (HCC)   Mild dementia   OSA (obstructive sleep apnea)   Multiple pulmonary nodules   Pulmonary emboli (HCC)   CAP (community acquired pneumonia)    65 y.o.malewith medical history significant of recent PE, DM type II, HTN, Hyperlipidemia, mild dementia, gout who presented to the ED twith c/o pleuritic chest and shaky chills , patient found to have multiple pulmonary nodules, multifocal left lung pneumonia, recent  Pulmonary embolism, currently 99% on room but was hypotensive on admission, with a presenting blood pressure of 71/ 51. Critical care was consulted and they recommend stepdown admission with volume resuscitation and broad-spectrum antibiotics for pneumonia. Influenza PCR negative  Assessment and plan Sepsis associated with left-sided pneumonia Patient received total of 3 L NS IV, blood pressure continues to be soft, continue IV fluids   lactic acid improving, Pro calcitonin 4.08 Respiratory panel and influenza panel negative Continue antibiotic therapy, now antibiotics tomorrow  Left-sided pneumonia - most likely community-acquired Continue IV antibiotics, Duoneb treatments, expectorant if needed, supplemental O2  DM type II - last HgbA1C is 6.2% in March 2017 Hold Glucophage Continue diabetic diet, monitor FSBS QACHS, add sliding scale insulin  AKI - most likely associated with acute illness, creatinine is improved from 1.56-1.16 Hold Celebrex, on renal dose Zyloprim for gout Continue to monitor renal indices after IV resuscitation  Hypertension - hold  antihypertensives (Beta blocker, Ca CB and ARB) and diuretic home medication d/t hypotension and restart when BP is stable  Mild dementia - continue Namenda and provide supportive care  Hyperlipidemia  Continue statin therapy  Hypokalemia Replete and replace, check magnesium  Migraine HA Continue Topamax  DVT prophylaxsis Xarelto  Code Status:  Full code    Family Communication: Discussed in detail with the patient, all imaging results, lab results explained to the patient   Disposition Plan:  CONTINUE STEP DOWN , PCCM consult       Consultants:  PCCM  Procedures:  None  Antibiotics: Anti-infectives    Start     Dose/Rate Route Frequency Ordered Stop   02/28/17 2300  vancomycin (VANCOCIN) IVPB 750 mg/150 ml premix     750 mg 150 mL/hr over 60 Minutes Intravenous Every 12 hours 02/28/17 1157     02/28/17 1900  piperacillin-tazobactam (ZOSYN) IVPB 3.375 g     3.375 g 12.5 mL/hr over 240 Minutes Intravenous Every 8 hours 02/28/17 1157     02/28/17 1115  vancomycin (VANCOCIN) IVPB 1000 mg/200 mL premix     1,000 mg 200 mL/hr over 60 Minutes Intravenous  Once 02/28/17 1102 02/28/17 1157   02/28/17 1100  piperacillin-tazobactam (ZOSYN) IVPB 3.375 g     3.375 g 100 mL/hr over 30 Minutes Intravenous  Once 02/28/17 1052 02/28/17 1143   02/28/17 1100  vancomycin (VANCOCIN) IVPB 1000 mg/200 mL premix     1,000 mg 200 mL/hr over 60 Minutes Intravenous  Once 02/28/17 1052 02/28/17 1157         HPI/Subjective: Blood pressure continues to be soft, afebrile,sob this morning   Objective: Vitals:   03/01/17 0359 03/01/17 0400 03/01/17 0500  03/01/17 0600  BP: (!) 106/53  (!) 99/55 98/63  Pulse: 76 76 77 78  Resp: 13 13 13 12   Temp: 98.2 F (36.8 C)     TempSrc: Oral     SpO2: 98% 100% 93% 94%  Weight:      Height:        Intake/Output Summary (Last 24 hours) at 03/01/17 0739 Last data filed at 03/01/17 0600  Gross per 24 hour  Intake             4060 ml   Output             1000 ml  Net             3060 ml    Exam:  Examination:  General exam: Appears calm and comfortable  Respiratory system: Clear to auscultation. Respiratory effort normal. Cardiovascular system: S1 & S2 heard, RRR. No JVD, murmurs, rubs, gallops or clicks. No pedal edema. Gastrointestinal system: Abdomen is nondistended, soft and nontender. No organomegaly or masses felt. Normal bowel sounds heard. Central nervous system: Alert and oriented. No focal neurological deficits. Extremities: Symmetric 5 x 5 power. Skin: No rashes, lesions or ulcers Psychiatry: Judgement and insight appear normal. Mood & affect appropriate.     Data Reviewed: I have personally reviewed following labs and imaging studies  Micro Results Recent Results (from the past 240 hour(s))  Respiratory Panel by PCR     Status: None   Collection Time: 02/28/17 10:56 AM  Result Value Ref Range Status   Adenovirus NOT DETECTED NOT DETECTED Final   Coronavirus 229E NOT DETECTED NOT DETECTED Final   Coronavirus HKU1 NOT DETECTED NOT DETECTED Final   Coronavirus NL63 NOT DETECTED NOT DETECTED Final   Coronavirus OC43 NOT DETECTED NOT DETECTED Final   Metapneumovirus NOT DETECTED NOT DETECTED Final   Rhinovirus / Enterovirus NOT DETECTED NOT DETECTED Final   Influenza A NOT DETECTED NOT DETECTED Final   Influenza B NOT DETECTED NOT DETECTED Final   Parainfluenza Virus 1 NOT DETECTED NOT DETECTED Final   Parainfluenza Virus 2 NOT DETECTED NOT DETECTED Final   Parainfluenza Virus 3 NOT DETECTED NOT DETECTED Final   Parainfluenza Virus 4 NOT DETECTED NOT DETECTED Final   Respiratory Syncytial Virus NOT DETECTED NOT DETECTED Final   Bordetella pertussis NOT DETECTED NOT DETECTED Final   Chlamydophila pneumoniae NOT DETECTED NOT DETECTED Final   Mycoplasma pneumoniae NOT DETECTED NOT DETECTED Final  Culture, expectorated sputum-assessment     Status: None   Collection Time: 02/28/17  3:57 PM  Result  Value Ref Range Status   Specimen Description EXPECTORATED SPUTUM  Final   Special Requests NONE  Final   Sputum evaluation THIS SPECIMEN IS ACCEPTABLE FOR SPUTUM CULTURE  Final   Report Status 02/28/2017 FINAL  Final  MRSA PCR Screening     Status: None   Collection Time: 02/28/17  5:55 PM  Result Value Ref Range Status   MRSA by PCR NEGATIVE NEGATIVE Final    Comment:        The GeneXpert MRSA Assay (FDA approved for NASAL specimens only), is one component of a comprehensive MRSA colonization surveillance program. It is not intended to diagnose MRSA infection nor to guide or monitor treatment for MRSA infections.     Radiology Reports Dg Chest 2 View  Result Date: 02/28/2017 CLINICAL DATA:  66 year old male with flu like illness for 3 days with increasing congestion and shortness of breath. EXAM: CHEST  2 VIEW COMPARISON:  02/04/2017 and earlier FINDINGS: Upright AP and lateral views of the chest. New abnormal anterior basal segment lower lobe opacity on the lateral view which is not well correlated on the frontal view but is probably on the left side. Also new patchy left perihilar superior segment or upper lobe opacity. No pleural effusion. Lower lung volumes. Stable cardiac size and mediastinal contours. Visualized tracheal air column is within normal limits. No acute osseous abnormality identified. Partially visible ACDF in the cervical spine Negative visible bowel gas pattern. IMPRESSION: 1. Multifocal left lung pneumonia.  No pleural effusion. 2. Followup PA and lateral chest X-ray is recommended in 3-4 weeks following trial of antibiotic therapy to ensure resolution and exclude underlying malignancy. Electronically Signed   By: Genevie Ann M.D.   On: 02/28/2017 11:51   Dg Chest 2 View  Result Date: 02/04/2017 CLINICAL DATA:  Shortness of breath began today. Diagnosis with pneumonia 1 week ago. Bilateral pulmonary emboli demonstrated on CT scan today. EXAM: CHEST  2 VIEW COMPARISON:   CT scan of the chest of February 04, 2017 and chest x-ray of January 27, 2017. FINDINGS: The lungs are adequately inflated. The area parenchymal density in the left mid lung on the previous chest x-ray is less conspicuous today. The right hemidiaphragm is slightly higher than the left. The heart and pulmonary vascularity are normal. There is no pleural effusion. The trachea is midline. The bony thorax exhibits no acute abnormality. There is multilevel degenerative disc disease of the thoracic spine. IMPRESSION: No acute pneumonia nor CHF. Mild interstitial prominence, stable. Interval improvement in the patchy density in the left mid lung but subtle abnormality remains. This may reflect an area of pulmonary infarction. Electronically Signed   By: David  Martinique M.D.   On: 02/04/2017 15:33   Ct Angio Chest Pe W Or Wo Contrast  Result Date: 02/04/2017 CLINICAL DATA:  Shortness of breath.  Recent pneumonia. EXAM: CT ANGIOGRAPHY CHEST WITH CONTRAST TECHNIQUE: Multidetector CT imaging of the chest was performed using the standard protocol during bolus administration of intravenous contrast. Multiplanar CT image reconstructions and MIPs were obtained to evaluate the vascular anatomy. CONTRAST:  80 cc Isovue 370 COMPARISON:  Chest CT 09/16/2016 FINDINGS: Chest wall: No chest wall mass, supraclavicular or axillary lymphadenopathy. The thyroid gland is grossly normal. Cardiovascular: The heart is normal in size. No pericardial effusion. Mild tortuosity of the thoracic aorta but no aneurysm or dissection. The branch vessels are patent. Scattered coronary artery calcifications. The pulmonary arterial tree is suboptimally opacified but there are definite bilateral pulmonary artery filling defects consistent with pulmonary embolism. This is greater on the right than the left. No findings for right heart strain. Mediastinum/Nodes: Large hiatal hernia. No mediastinal or hilar mass or adenopathy. Lungs/Pleura: Stable bilateral  pulmonary nodules. 6 mm right upper lobe nodule on image number 22 is stable. 3 mm right upper lobe nodule on image 34 is stable. 5 mm right lower lobe nodule on image number 41 is stable. 6 mm left lower lobe pulmonary nodule on image number 86 is stable. Two adjacent 6 mm nodular densities in the left upper lobe on image number 43 are new since the prior study but are likely inflammatory. Right lower lobe segmental atelectasis. Possible mild pulmonary edema. No pleural effusions. Upper Abdomen: No significant upper abdominal findings. Stable hepatic cysts. Right renal calculus. Musculoskeletal: No significant bony findings. Review of the MIP images confirms the above findings. IMPRESSION: 1. Bilateral pulmonary artery emboli. No findings for right heart strain.  2. Multiple stable pulmonary nodules. Two new adjacent nodules in the left lobe are likely inflammatory. Recommend short-term followup noncontrast chest CT (3-6 months.) 3. No mediastinal or hilar mass or adenopathy. Stable large hiatal hernia. These results will be called to the ordering clinician or representative by the Radiologist Assistant, and communication documented in the PACS or zVision Dashboard. Electronically Signed   By: Marijo Sanes M.D.   On: 02/04/2017 13:57     CBC  Recent Labs Lab 02/28/17 1026 03/01/17 0338  WBC 18.6* 18.0*  HGB 13.1 10.6*  HCT 39.7 32.7*  PLT 217 171  MCV 91.9 92.1  MCH 30.3 29.9  MCHC 33.0 32.4  RDW 13.8 14.2  LYMPHSABS 0.7 2.1  MONOABS 0.9 1.0  EOSABS 0.0 0.1  BASOSABS 0.0 0.0    Chemistries   Recent Labs Lab 02/28/17 1026 03/01/17 0338  NA 139 142  K 3.4* 3.4*  CL 110 115*  CO2 19* 19*  GLUCOSE 263* 113*  BUN 15 12  CREATININE 1.56* 1.16  CALCIUM 9.4 8.0*  AST 28 20  ALT 36 25  ALKPHOS 64 62  BILITOT 0.7 0.7   ------------------------------------------------------------------------------------------------------------------ estimated creatinine clearance is 74.4 mL/min (by  C-G formula based on SCr of 1.16 mg/dL). ------------------------------------------------------------------------------------------------------------------ No results for input(s): HGBA1C in the last 72 hours. ------------------------------------------------------------------------------------------------------------------ No results for input(s): CHOL, HDL, LDLCALC, TRIG, CHOLHDL, LDLDIRECT in the last 72 hours. ------------------------------------------------------------------------------------------------------------------ No results for input(s): TSH, T4TOTAL, T3FREE, THYROIDAB in the last 72 hours.  Invalid input(s): FREET3 ------------------------------------------------------------------------------------------------------------------ No results for input(s): VITAMINB12, FOLATE, FERRITIN, TIBC, IRON, RETICCTPCT in the last 72 hours.  Coagulation profile  Recent Labs Lab 02/28/17 1026 02/28/17 1335  INR 2.68 2.71    No results for input(s): DDIMER in the last 72 hours.  Cardiac Enzymes No results for input(s): CKMB, TROPONINI, MYOGLOBIN in the last 168 hours.  Invalid input(s): CK ------------------------------------------------------------------------------------------------------------------ Invalid input(s): POCBNP   CBG:  Recent Labs Lab 02/28/17 1803 02/28/17 2009  GLUCAP 169* 131*       Studies: Dg Chest 2 View  Result Date: 02/28/2017 CLINICAL DATA:  66 year old male with flu like illness for 3 days with increasing congestion and shortness of breath. EXAM: CHEST  2 VIEW COMPARISON:  02/04/2017 and earlier FINDINGS: Upright AP and lateral views of the chest. New abnormal anterior basal segment lower lobe opacity on the lateral view which is not well correlated on the frontal view but is probably on the left side. Also new patchy left perihilar superior segment or upper lobe opacity. No pleural effusion. Lower lung volumes. Stable cardiac size and mediastinal  contours. Visualized tracheal air column is within normal limits. No acute osseous abnormality identified. Partially visible ACDF in the cervical spine Negative visible bowel gas pattern. IMPRESSION: 1. Multifocal left lung pneumonia.  No pleural effusion. 2. Followup PA and lateral chest X-ray is recommended in 3-4 weeks following trial of antibiotic therapy to ensure resolution and exclude underlying malignancy. Electronically Signed   By: Genevie Ann M.D.   On: 02/28/2017 11:51      Lab Results  Component Value Date   HGBA1C 6.2 02/29/2016   HGBA1C 5.8 07/18/2015   HGBA1C 6.2 03/21/2015   Lab Results  Component Value Date   MICROALBUR <0.7 02/29/2016   LDLCALC 81 09/19/2016   CREATININE 1.16 03/01/2017       Scheduled Meds: . acidophilus  1 capsule Oral Daily  . allopurinol  100 mg Oral Daily  . cetirizine  10 mg Oral QPM  .  gabapentin  300 mg Oral QHS  . insulin aspart  0-9 Units Subcutaneous TID WC  . ipratropium-albuterol  3 mL Nebulization BID  . memantine  20 mg Oral Daily  . pantoprazole  40 mg Oral BID  . piperacillin-tazobactam (ZOSYN)  IV  3.375 g Intravenous Q8H  . potassium chloride SA  20 mEq Oral Daily  . pravastatin  10 mg Oral Daily  . rivaroxaban  20 mg Oral Daily  . terazosin  5 mg Oral QHS  . topiramate  25 mg Oral Daily  . topiramate  50 mg Oral QHS  . vancomycin  750 mg Intravenous Q12H   Continuous Infusions: . 0.9 % NaCl with KCl 40 mEq / L       LOS: 1 day    Time spent: >30 MINS    [facility] Pager [phone]. If 7PM-7AM, please contact night-coverage at [email or web address], password [facility] 03/01/2017, 7:39 AM  LOS: 1 day

## 2017-03-01 NOTE — Consult Note (Signed)
PCCM Consult Note  Admission date: 02/28/2017 Consult date: 03/01/2017 Referring provider: Dr. Allyson Sabal, [facility]  CC: Short of breath  HPI: 66 yo male presented with cough, chills.  Found to have b/l pneumonia.  Was recently dx with PE and started on xarelto.  He has headache and mild chest discomfort.  Has cough with yellow sputum and some blood streaks.  Denies sick exposures.  Has sinus congestion.  Denies vision change, ear pain, sore throat, wheeze, abdominal pain, nausea, diarrhea, skin rash, or swelling.  No hx of smoking.  He  has a past medical history of Allergy; Arthritis; Asthma; Cataract; Cellulitis (2013); Colon polyps; Diabetes mellitus; GERD (gastroesophageal reflux disease); Gout; HOH (hard of hearing); Hypertension; Memory loss, short term ('07); Migraine headache without aura; Skin cancer; and Sleep apnea.  He  has a past surgical history that includes Vasectomy; Myringotomy; Strabismus surgery (1994); ORIF tibia & fibula fractures (1998); incision and drain ('03); Hiatal hernia repair; Anterior cervical decomp/discectomy fusion (N/A, 02/25/2014); Lumbar laminectomy/decompression microdiscectomy (Right, 02/25/2014); Cardiac catheterization ('94); Maximum access (mas)posterior lumbar interbody fusion (plif) 1 level (N/A, 11/14/2014); colonoscopy with polypectomy (2013); Cataract extraction; and Eye surgery.  His family history includes Dementia in his mother; Diabetes in his maternal grandmother; Heart attack in his maternal grandfather; Heart attack (age of onset: 49) in his paternal grandfather; Hypertension in his mother and sister; Stroke in his paternal grandfather. There is no history of Colon cancer or Stomach cancer.  He  reports that he quit smoking about 26 years ago. His smoking use included Cigarettes. He has a 90.00 pack-year smoking history. His smokeless tobacco use includes Snuff. He reports that he drinks about 0.6 - 1.2 oz of alcohol per week . He reports that he does not use  drugs.  Allergies  Allergen Reactions  . Bee Venom Anaphylaxis  . Garlic Swelling   No current facility-administered medications on file prior to encounter.    Current Outpatient Prescriptions on File Prior to Encounter  Medication Sig  . allopurinol (ZYLOPRIM) 100 MG tablet Take 1 tablet (100 mg total) by mouth daily.  . celecoxib (CELEBREX) 200 MG capsule TAKE 1 CAPSULE BY MOUTH 2 TIMES DAILY. (Patient taking differently: Take 400 mg by mouth daily. )  . cyanocobalamin 500 MCG tablet Take 500 mcg by mouth daily.    . furosemide (LASIX) 20 MG tablet Take 1 tablet (20 mg total) by mouth daily.  Marland Kitchen gabapentin (NEURONTIN) 300 MG capsule Take 1 capsule (300 mg total) by mouth at bedtime.  . irbesartan (AVAPRO) 150 MG tablet Take 1 tablet (150 mg total) by mouth daily. (Patient taking differently: Take 75 mg by mouth daily. )  . levocetirizine (XYZAL) 5 MG tablet Take 1 tablet (5 mg total) by mouth every evening.  . memantine (NAMENDA) 10 MG tablet Take 1 tablet (10 mg total) by mouth 2 (two) times daily. (Patient taking differently: Take 20 mg by mouth daily. )  . metoprolol (LOPRESSOR) 100 MG tablet Take 1 tablet (100 mg total) by mouth 2 (two) times daily. (Patient taking differently: Take 50 mg by mouth daily. )  . mometasone (NASONEX) 50 MCG/ACT nasal spray 2 sprays each nostril qd (Patient taking differently: Place 2 sprays into the nose daily. )  . Multiple Vitamins-Minerals (ONE-A-DAY WEIGHT SMART ADVANCE PO) Take 1 tablet by mouth daily.   . pantoprazole (PROTONIX) 40 MG tablet Take 1 tablet (40 mg total) by mouth 2 (two) times daily.  . potassium chloride SA (K-DUR,KLOR-CON) 20 MEQ tablet Take  1 tablet (20 mEq total) by mouth daily.  . pravastatin (PRAVACHOL) 10 MG tablet Take 1 tablet (10 mg total) by mouth daily.  . rivaroxaban (XARELTO) 20 MG TABS tablet Take 1 tablet (20 mg total) by mouth daily.  Marland Kitchen terazosin (HYTRIN) 5 MG capsule TAKE 1 CAPSULE BY MOUTH ONCE AT BEDTIME (Patient  taking differently: Take 5 mg by mouth at bedtime. TAKE 1 CAPSULE BY MOUTH ONCE AT BEDTIME)  . topiramate (TOPAMAX) 25 MG tablet Take 1 tablet (25 mg total) by mouth 3 (three) times daily. (Patient taking differently: Take 25-50 mg by mouth 2 (two) times daily. Take 1 tablet in the morning, and 2 tablets at night)  . verapamil (CALAN-SR) 240 MG CR tablet Take 1 tablet (240 mg total) by mouth at bedtime. (Patient taking differently: Take 240 mg by mouth daily. )  . EPINEPHrine (EPIPEN) 0.3 mg/0.3 mL SOAJ injection Inject 0.3 mg into the muscle once.   ROS: Negative except above  Vital signs: BP 98/63   Pulse 78   Temp 98.3 F (36.8 C) (Oral)   Resp 12   Ht 5\' 9"  (1.753 m)   Wt 229 lb 0.9 oz (103.9 kg)   SpO2 97%   BMI 33.83 kg/m   Intake/output: I/O last 3 completed shifts: In: 8676 [P.O.:360; IV Piggyback:3700] Out: 1500 [Urine:1500]  General: alert Neuro: Normal strength, CN intact HEENT: Pupils reactive, no oral exudate, no LAN, no stridor Cardiac: regular, no murmur Chest: b/l crackles, no wheeze Abd: soft, non tender Ext: no edema Skin: no rashes   CMP Latest Ref Rng & Units 03/01/2017 02/28/2017 02/04/2017  Glucose 65 - 99 mg/dL 113(H) 263(H) 104(H)  BUN 6 - 20 mg/dL 12 15 19   Creatinine 0.61 - 1.24 mg/dL 1.16 1.56(H) 1.14  Sodium 135 - 145 mmol/L 142 139 139  Potassium 3.5 - 5.1 mmol/L 3.4(L) 3.4(L) 3.4(L)  Chloride 101 - 111 mmol/L 115(H) 110 107  CO2 22 - 32 mmol/L 19(L) 19(L) 21(L)  Calcium 8.9 - 10.3 mg/dL 8.0(L) 9.4 9.2  Total Protein 6.5 - 8.1 g/dL 5.3(L) 6.3(L) -  Total Bilirubin 0.3 - 1.2 mg/dL 0.7 0.7 -  Alkaline Phos 38 - 126 U/L 62 64 -  AST 15 - 41 U/L 20 28 -  ALT 17 - 63 U/L 25 36 -     CBC Latest Ref Rng & Units 03/01/2017 02/28/2017 02/04/2017  WBC 4.0 - 10.5 K/uL 18.0(H) 18.6(H) 9.9  Hemoglobin 13.0 - 17.0 g/dL 10.6(L) 13.1 13.8  Hematocrit 39.0 - 52.0 % 32.7(L) 39.7 42.3  Platelets 150 - 400 K/uL 171 217 200     ABG No results found for:  PHART, PCO2ART, PO2ART, HCO3, TCO2, ACIDBASEDEF, O2SAT   CBG (last 3)   Recent Labs  02/28/17 1803 02/28/17 2009 03/01/17 0820  GLUCAP 169* 131* 155*     Imaging: Dg Chest 2 View  Result Date: 02/28/2017 CLINICAL DATA:  66 year old male with flu like illness for 3 days with increasing congestion and shortness of breath. EXAM: CHEST  2 VIEW COMPARISON:  02/04/2017 and earlier FINDINGS: Upright AP and lateral views of the chest. New abnormal anterior basal segment lower lobe opacity on the lateral view which is not well correlated on the frontal view but is probably on the left side. Also new patchy left perihilar superior segment or upper lobe opacity. No pleural effusion. Lower lung volumes. Stable cardiac size and mediastinal contours. Visualized tracheal air column is within normal limits. No acute osseous abnormality identified. Partially  visible ACDF in the cervical spine Negative visible bowel gas pattern. IMPRESSION: 1. Multifocal left lung pneumonia.  No pleural effusion. 2. Followup PA and lateral chest X-ray is recommended in 3-4 weeks following trial of antibiotic therapy to ensure resolution and exclude underlying malignancy. Electronically Signed   By: Genevie Ann M.D.   On: 02/28/2017 11:51     Studies: CT angio chest 02/04/17 >> b/l PE, multiple pulmonary nodules  Antibiotics: Vancomycin 3/09 >> Zosyn 3/09 >>  Cultures: Influenza PCR 3/09 >> negative Respiratory viral panel 3/09 >> negative Pneumococcal Ag 3/09 >> negative Sputum 3/09 >> Blood 3/09 >>  Events: 3/09 Admit  Summary: 66 yo male with sepsis, hypoxia from pneumonia.  Had recent dx of PE on xarelto.  Assessment/plan:  Sepsis from pneumonia. - continue Abx - f/u CXR intermittently  Acute hypoxic respiratory failure. - oxygen to keep SpO2 > 92% - Bipap prn - keep even to slightly positive fluid balance >> defer additional attempts at diuresis  Lactic acidosis. - improved  Hx of pulmonary  embolism. - continue xarelto  Hx of HTN. - f/u Echo  Updated pt's wife at bedside.  Chesley Mires, MD [facility]/Critical Care 03/01/2017, 10:56 AM Pager:  [phone] After 3pm call: [phone]

## 2017-03-01 NOTE — Discharge Instructions (Signed)
Information on my medicine - XARELTO (rivaroxaban)  This medication education was reviewed with me or my healthcare representative as part of my discharge preparation.  The pharmacist that spoke with me during my hospital stay was:  ADYAN PALAU, [facility]  WHY WAS [place]? Xarelto was prescribed to treat blood clots that may have been found in the veins of your legs (deep vein thrombosis) or in your lungs (pulmonary embolism) and to reduce the risk of them occurring again.  What do you need to know about Xarelto? The starting dose is one 15 mg tablet taken TWICE daily with food for the FIRST 21 DAYS then on (enter date)  02/23/17  the dose is changed to one 20 mg tablet taken ONCE A DAY with your evening meal.  DO NOT stop taking Xarelto without talking to the health care provider who prescribed the medication.  Refill your prescription for 20 mg tablets before you run out.  After discharge, you should have regular check-up appointments with your healthcare provider that is prescribing your Xarelto.  In the future your dose may need to be changed if your kidney function changes by a significant amount.  What do you do if you miss a dose? If you are taking Xarelto TWICE DAILY and you miss a dose, take it as soon as you remember. You may take two 15 mg tablets (total 30 mg) at the same time then resume your regularly scheduled 15 mg twice daily the next day.  If you are taking Xarelto ONCE DAILY and you miss a dose, take it as soon as you remember on the same day then continue your regularly scheduled once daily regimen the next day. Do not take two doses of Xarelto at the same time.   Important Safety Information Xarelto is a blood thinner medicine that can cause bleeding. You should call your healthcare provider right away if you experience any of the following: ? Bleeding from an injury or your nose that does not stop. ? Unusual colored urine (red or dark brown) or  unusual colored stools (red or black). ? Unusual bruising for unknown reasons. ? A serious fall or if you hit your head (even if there is no bleeding).  Some medicines may interact with Xarelto and might increase your risk of bleeding while on Xarelto. To help avoid this, consult your healthcare provider or pharmacist prior to using any new prescription or non-prescription medications, including herbals, vitamins, non-steroidal anti-inflammatory drugs (NSAIDs) and supplements.  This website has more information on Xarelto: [email or web address]

## 2017-03-01 NOTE — Plan of Care (Signed)
Problem: Fluid Volume: Goal: Ability to maintain a balanced intake and output will improve Outcome: Progressing Discussed with patient and family about his fluid levels and being aware of by mouth intake with some teach back displayed.

## 2017-03-01 NOTE — Plan of Care (Signed)
Problem: Physical Regulation: Goal: Ability to maintain clinical measurements within normal limits will improve Outcome: Progressing Discussed with patient about Sepsis and temperature fluctuations with some teach back displayed.

## 2017-03-01 NOTE — Progress Notes (Signed)
BiPAP set up. Education given to patient on the proper use of th machine. Pt states that he wears auto BiPAP at home 75max/5min. Setting were adjusted according. Pt states that he will wear it once he is ready for bed. Mask adjusted to comfort. Told patient if need any additional help with the mask or machine to please notify staff to let me know. Pt is stable at this time. Neb given.

## 2017-03-02 DIAGNOSIS — M1000 Idiopathic gout, unspecified site: Secondary | ICD-10-CM

## 2017-03-02 LAB — GLUCOSE, CAPILLARY
GLUCOSE-CAPILLARY: 126 mg/dL — AB (ref 65–99)
Glucose-Capillary: 117 mg/dL — ABNORMAL HIGH (ref 65–99)
Glucose-Capillary: 113 mg/dL — ABNORMAL HIGH (ref 65–99)

## 2017-03-02 LAB — BASIC METABOLIC PANEL
ANION GAP: 8 (ref 5–15)
BUN: 11 mg/dL (ref 6–20)
CHLORIDE: 114 mmol/L — AB (ref 101–111)
CO2: 20 mmol/L — AB (ref 22–32)
Calcium: 8.8 mg/dL — ABNORMAL LOW (ref 8.9–10.3)
Creatinine, Ser: 1.12 mg/dL (ref 0.61–1.24)
GFR calc non Af Amer: >60 mL/min (ref >60)
Glucose, Bld: 120 mg/dL — ABNORMAL HIGH (ref 65–99)
POTASSIUM: 3.1 mmol/L — AB (ref 3.5–5.1)
SODIUM: 142 mmol/L (ref 135–145)

## 2017-03-02 MED ORDER — AZITHROMYCIN 500 MG PO TABS
500.0000 mg | ORAL_TABLET | Freq: Every day | ORAL | Status: DC
  Administered 2017-03-02 – 2017-03-03 (×2): 500 mg via ORAL
  Filled 2017-03-02 (×2): qty 1

## 2017-03-02 MED ORDER — DEXTROSE 5 % IV SOLN
1.0000 g | INTRAVENOUS | Status: DC
  Administered 2017-03-02 – 2017-03-03 (×2): 1 g via INTRAVENOUS
  Filled 2017-03-02 (×2): qty 10

## 2017-03-02 MED ORDER — MAGNESIUM OXIDE 400 (241.3 MG) MG PO TABS
400.0000 mg | ORAL_TABLET | Freq: Every day | ORAL | Status: DC
  Administered 2017-03-02 – 2017-03-03 (×2): 400 mg via ORAL
  Filled 2017-03-02 (×2): qty 1

## 2017-03-02 MED ORDER — POTASSIUM CHLORIDE CRYS ER 20 MEQ PO TBCR
40.0000 meq | EXTENDED_RELEASE_TABLET | Freq: Two times a day (BID) | ORAL | Status: DC
  Administered 2017-03-02 – 2017-03-03 (×2): 40 meq via ORAL
  Filled 2017-03-02 (×3): qty 2

## 2017-03-02 NOTE — Progress Notes (Signed)
RT Note: Rt was called to come see patient because he was having some trouble taking a deep breath. Patient did not appear to be in any acute respiratory distress upon my arrival but was he was stating that he was in fact having trouble taking a deep breath. Patient was administered his PRN nebulizer as ordered by MD and he stated that he felt better afterwards and was then able to take a deep breath. Patient is stable and in no acute distress. RT notified him when his next scheduled nebulizer treatment would be administered to him and that at that time the night shift RT can assess him and decide whether he needs his treatment regimen adjusted. Rt will continue to monitor and assist as needed.

## 2017-03-02 NOTE — Progress Notes (Addendum)
PCCM Consult Note  Admission date: 02/28/2017 Consult date: 03/01/2017 Referring provider: Dr. Allyson Sabal, [facility]  CC: Short of breath  HPI: 66 yo male presented with cough, chills.  Found to have b/l pneumonia.  Was recently dx with PE and started on xarelto.    Subjective: Still has pleuritic pain on Rt.  Breathing better otherwise.    Vital signs: BP 137/78   Pulse 80   Temp 99.1 F (37.3 C) (Axillary)   Resp 17   Ht 5\' 9"  (1.753 m)   Wt 229 lb 0.9 oz (103.9 kg)   SpO2 96%   BMI 33.83 kg/m   Intake/output: I/O last 3 completed shifts: In: 1130 [P.O.:480; Other:50; IV Piggyback:600] Out: 4575 [NIOEV:0350]  General: alert Neuro: normal strength HEENT: no stridor Cardiac: regular Chest: faint crackles Rt > Lt, no wheeze Abd: soft, non tender Ext: no edema Skin: no rashes   CMP Latest Ref Rng & Units 03/02/2017 03/01/2017 02/28/2017  Glucose 65 - 99 mg/dL 120(H) 113(H) 263(H)  BUN 6 - 20 mg/dL 11 12 15   Creatinine 0.61 - 1.24 mg/dL 1.12 1.16 1.56(H)  Sodium 135 - 145 mmol/L 142 142 139  Potassium 3.5 - 5.1 mmol/L 3.1(L) 3.4(L) 3.4(L)  Chloride 101 - 111 mmol/L 114(H) 115(H) 110  CO2 22 - 32 mmol/L 20(L) 19(L) 19(L)  Calcium 8.9 - 10.3 mg/dL 8.8(L) 8.0(L) 9.4  Total Protein 6.5 - 8.1 g/dL - 5.3(L) 6.3(L)  Total Bilirubin 0.3 - 1.2 mg/dL - 0.7 0.7  Alkaline Phos 38 - 126 U/L - 62 64  AST 15 - 41 U/L - 20 28  ALT 17 - 63 U/L - 25 36     CBC Latest Ref Rng & Units 03/01/2017 02/28/2017 02/04/2017  WBC 4.0 - 10.5 K/uL 18.0(H) 18.6(H) 9.9  Hemoglobin 13.0 - 17.0 g/dL 10.6(L) 13.1 13.8  Hematocrit 39.0 - 52.0 % 32.7(L) 39.7 42.3  Platelets 150 - 400 K/uL 171 217 200     ABG No results found for: PHART, PCO2ART, PO2ART, HCO3, TCO2, ACIDBASEDEF, O2SAT   CBG (last 3)   Recent Labs  03/01/17 1814 03/01/17 1934 03/02/17 0755  GLUCAP 111* 178* 126*     Imaging: Dg Chest 2 View  Result Date: 02/28/2017 CLINICAL DATA:  66 year old male with flu like illness for 3  days with increasing congestion and shortness of breath. EXAM: CHEST  2 VIEW COMPARISON:  02/04/2017 and earlier FINDINGS: Upright AP and lateral views of the chest. New abnormal anterior basal segment lower lobe opacity on the lateral view which is not well correlated on the frontal view but is probably on the left side. Also new patchy left perihilar superior segment or upper lobe opacity. No pleural effusion. Lower lung volumes. Stable cardiac size and mediastinal contours. Visualized tracheal air column is within normal limits. No acute osseous abnormality identified. Partially visible ACDF in the cervical spine Negative visible bowel gas pattern. IMPRESSION: 1. Multifocal left lung pneumonia.  No pleural effusion. 2. Followup PA and lateral chest X-ray is recommended in 3-4 weeks following trial of antibiotic therapy to ensure resolution and exclude underlying malignancy. Electronically Signed   By: Genevie Ann M.D.   On: 02/28/2017 11:51     Studies: CT angio chest 02/04/17 >> b/l PE, multiple pulmonary nodules  Antibiotics: Vancomycin 3/09 >> 3/11 Zosyn 3/09 >> 3/11 Zithromax 3/11 >> Rocephin 3/11 >>  Cultures: Influenza PCR 3/09 >> negative Respiratory viral panel 3/09 >> negative Pneumococcal Ag 3/09 >> negative Sputum 3/09 >> Blood 3/09 >>  Events: 3/09 Admit  Summary: 66 yo male with sepsis, hypoxia from pneumonia.  Had recent dx of PE on xarelto.  Assessment/plan:  Sepsis from pneumonia. - continue Abx - f/u CXR 3/12  Acute hypoxic respiratory failure. Hx of OSA. - oxygen to keep SpO2 > 92% - Bipap qhs and prn  Hx of pulmonary embolism. - continue xarelto  Hx of HTN. - f/u Echo  Updated wife.  Chesley Mires, MD [facility] Pulmonary/Critical Care 03/02/2017, 10:22 AM Pager:  [phone] After 3pm call: [phone]

## 2017-03-02 NOTE — Progress Notes (Signed)
NURSING PROGRESS NOTE  Garrett [name] [id number] Transfer Data: 03/02/2017 4:03 PM Attending Provider: Reyne Dumas, MD YTR:ZNBVAP R Carollee Herter, DO Code Status: Full  Garrett [name] is a 66 y.o. male patient transferred from [address] [place] -No acute distress noted.  -No complaints of shortness of breath.  -No complaints of chest pain.   Cardiac Monitoring: Box # 7 in place. Cardiac monitor yields NSR  Blood pressure 127/67, pulse 81, temperature 97.9 F (36.6 C), temperature source Oral, resp. rate (!) 24, height 5\' 9"  (1.753 m), weight 103 kg (227 lb), SpO2 97 %.   IV Fluids:  IV in place, occlusive dsg intact without redness, IV cath R hand and rac, saline locked.   Allergies:  Bee venom and Garlic  Past Medical History:   has a past medical history of Allergy; Arthritis; Asthma; Cataract; Cellulitis (2013); Colon polyps; Diabetes mellitus; GERD (gastroesophageal reflux disease); Gout; HOH (hard of hearing); Hypertension; Memory loss, short term ('07); Migraine headache without aura; Skin cancer; and Sleep apnea.  Past Surgical History:   has a past surgical history that includes Vasectomy; Myringotomy; Strabismus surgery (1994); ORIF tibia & fibula fractures (1998); incision and drain ('03); Hiatal hernia repair; Anterior cervical decomp/discectomy fusion (N/A, 02/25/2014); Lumbar laminectomy/decompression microdiscectomy (Right, 02/25/2014); Cardiac catheterization ('94); Maximum access (mas)posterior lumbar interbody fusion (plif) 1 level (N/A, 11/14/2014); colonoscopy with polypectomy (2013); Cataract extraction; and Eye surgery.  Social History:   reports that he quit smoking about 26 years ago. His smoking use included Cigarettes. He has a 90.00 pack-year smoking history. His smokeless tobacco use includes Snuff. He reports that he drinks about 0.6 - 1.2 oz of alcohol per week . He reports that he does not use drugs.  Skin: intact  Patient/Family orientated to room. Information packet  given to patient/family. Admission inpatient armband information verified with patient/family to include name and date of birth and placed on patient arm. Side rails up x 2, fall assessment and education completed with patient/family. Patient/family able to verbalize understanding of risk associated with falls and verbalized understanding to call for assistance before getting out of bed. Call light within reach. Patient/family able to voice and demonstrate understanding of unit orientation instructions.    Will continue to evaluate and treat per MD orders.

## 2017-03-02 NOTE — Progress Notes (Signed)
RT NOTE:  CPAP @ bedside, humidity chamber filled with sterile water, no O2 @ this time. Home settings: Auto 5/20. Pt will put on when he is ready for sleep.

## 2017-03-02 NOTE — Progress Notes (Signed)
Pt reported some left chest pain radiating to his back. EKG and vitals on file. NP Schorr notified.

## 2017-03-02 NOTE — Progress Notes (Signed)
[facility] PROGRESS NOTE  Garrett [name] FWY:[id number] DOB: 1951/08/15 DOA: 02/28/2017   PCP: Ann Held, DO     Assessment/Plan: Principal Problem:   Sepsis ([place]) Active Problems:   Controlled type 2 diabetes mellitus with diabetic nephropathy ([place])   Gout   Essential hypertension   Migraine headache without aura   Hyperlipidemia associated with type 2 diabetes mellitus (HCC)   Mild dementia   OSA (obstructive sleep apnea)   Multiple pulmonary nodules   Pulmonary emboli (HCC)   CAP (community acquired pneumonia)    65 y.o.malewith medical history significant of recent PE, DM type II, HTN, Hyperlipidemia, mild dementia, gout who presented to the ED twith c/o pleuritic chest and shaky chills , patient found to have multiple pulmonary nodules, multifocal left lung pneumonia, recent  Pulmonary embolism, currently 99% on room but was hypotensive on admission, with a presenting blood pressure of 71/ 51. Critical care was consulted and they recommend stepdown admission with volume resuscitation and broad-spectrum antibiotics for pneumonia. Influenza PCR negative  Assessment and plan Sepsis associated with left-sided pneumonia Patient initially received total of 3 L NS IV, blood pressure parameters have improved. IV fluids held because of shortness of breath   lactic acid improving, Pro calcitonin 4.08 Respiratory panel and influenza panel negative Initially started on vancomycin and Zosyn, will switch to Rocephin/azithromycin given negative blood cultures Evaluated by pulmonary-continue using Bipap prn  Left-sided pneumonia - most likely community-acquired Continue  antibiotics, Duoneb treatments, expectorant if needed, supplemental O2  DM type II - last HgbA1C is 6.2% in March 2017 Hold Glucophage Continue diabetic diet, monitor FSBS QACHS, add sliding scale insulin  AKI - most likely associated with acute illness, creatinine is improved from  1.56-1.16, now 1.12 Hold Celebrex, on renal dose Zyloprim for gout Continue to monitor renal function  Hypertension - hold antihypertensives (Beta blocker, Ca CB and ARB) and diuretic home medication d/t hypotension and restart when BP is stable  Mild dementia - continue Namenda and provide supportive care  Hyperlipidemia  Continue statin therapy  Hypokalemia Keep potassium greater than 4, magnesium greater than 2  Migraine HA Continue Topamax  DVT prophylaxsis Xarelto  Code Status:  Full code    Family Communication: Discussed in detail with the patient, all imaging results, lab results explained to the patient   Disposition Plan:  Transfer to telemetry       Consultants:  PCCM  Procedures:  None  Antibiotics: Anti-infectives    Start     Dose/Rate Route Frequency Ordered Stop   02/28/17 2300  vancomycin (VANCOCIN) IVPB 750 mg/150 ml premix     750 mg 150 mL/hr over 60 Minutes Intravenous Every 12 hours 02/28/17 1157     02/28/17 1900  piperacillin-tazobactam (ZOSYN) IVPB 3.375 g     3.375 g 12.5 mL/hr over 240 Minutes Intravenous Every 8 hours 02/28/17 1157     02/28/17 1115  vancomycin (VANCOCIN) IVPB 1000 mg/200 mL premix     1,000 mg 200 mL/hr over 60 Minutes Intravenous  Once 02/28/17 1102 02/28/17 1157   02/28/17 1100  piperacillin-tazobactam (ZOSYN) IVPB 3.375 g     3.375 g 100 mL/hr over 30 Minutes Intravenous  Once 02/28/17 1052 02/28/17 1143   02/28/17 1100  vancomycin (VANCOCIN) IVPB 1000 mg/200 mL premix     1,000 mg 200 mL/hr over 60 Minutes Intravenous  Once 02/28/17 1052 02/28/17 1157         HPI/Subjective: Sob improved.  Patient ambulating , doing well per wife   Objective: Vitals:   03/02/17 0500 03/02/17 0600 03/02/17 0700 03/02/17 0754  BP: 124/74 137/78    Pulse: 76 80    Resp: 15 17    Temp:   99.1 F (37.3 C)   TempSrc:   Axillary   SpO2: 94% 95%  96%  Weight:      Height:        Intake/Output Summary (Last 24  hours) at 03/02/17 0959 Last data filed at 03/02/17 0600  Gross per 24 hour  Intake              690 ml  Output             3075 ml  Net            -2385 ml    Exam:  Examination:  General exam: Appears calm and comfortable  Respiratory system: Clear to auscultation. Respiratory effort normal. Cardiovascular system: S1 & S2 heard, RRR. No JVD, murmurs, rubs, gallops or clicks. No pedal edema. Gastrointestinal system: Abdomen is nondistended, soft and nontender. No organomegaly or masses felt. Normal bowel sounds heard. Central nervous system: Alert and oriented. No focal neurological deficits. Extremities: Symmetric 5 x 5 power. Skin: No rashes, lesions or ulcers Psychiatry: Judgement and insight appear normal. Mood & affect appropriate.     Data Reviewed: I have personally reviewed following labs and imaging studies  Micro Results Recent Results (from the past 240 hour(s))  Culture, blood (Routine x 2)     Status: None (Preliminary result)   Collection Time: 02/28/17 10:31 AM  Result Value Ref Range Status   Specimen Description BLOOD LEFT ANTECUBITAL  Final   Special Requests BOTTLES DRAWN AEROBIC ONLY  10CC  Final   Culture NO GROWTH < 24 HOURS  Final   Report Status PENDING  Incomplete  Culture, blood (Routine x 2)     Status: None (Preliminary result)   Collection Time: 02/28/17 10:39 AM  Result Value Ref Range Status   Specimen Description BLOOD RIGHT HAND  Final   Special Requests BOTTLES DRAWN AEROBIC ONLY  10CC  Final   Culture NO GROWTH < 24 HOURS  Final   Report Status PENDING  Incomplete  Respiratory Panel by PCR     Status: None   Collection Time: 02/28/17 10:56 AM  Result Value Ref Range Status   Adenovirus NOT DETECTED NOT DETECTED Final   Coronavirus 229E NOT DETECTED NOT DETECTED Final   Coronavirus HKU1 NOT DETECTED NOT DETECTED Final   Coronavirus NL63 NOT DETECTED NOT DETECTED Final   Coronavirus OC43 NOT DETECTED NOT DETECTED Final    Metapneumovirus NOT DETECTED NOT DETECTED Final   Rhinovirus / Enterovirus NOT DETECTED NOT DETECTED Final   Influenza A NOT DETECTED NOT DETECTED Final   Influenza B NOT DETECTED NOT DETECTED Final   Parainfluenza Virus 1 NOT DETECTED NOT DETECTED Final   Parainfluenza Virus 2 NOT DETECTED NOT DETECTED Final   Parainfluenza Virus 3 NOT DETECTED NOT DETECTED Final   Parainfluenza Virus 4 NOT DETECTED NOT DETECTED Final   Respiratory Syncytial Virus NOT DETECTED NOT DETECTED Final   Bordetella pertussis NOT DETECTED NOT DETECTED Final   Chlamydophila pneumoniae NOT DETECTED NOT DETECTED Final   Mycoplasma pneumoniae NOT DETECTED NOT DETECTED Final  Culture, expectorated sputum-assessment     Status: None   Collection Time: 02/28/17  3:57 PM  Result Value Ref Range Status   Specimen Description EXPECTORATED SPUTUM  Final  Special Requests NONE  Final   Sputum evaluation THIS SPECIMEN IS ACCEPTABLE FOR SPUTUM CULTURE  Final   Report Status 02/28/2017 FINAL  Final  Culture, respiratory (NON-Expectorated)     Status: None (Preliminary result)   Collection Time: 02/28/17  3:57 PM  Result Value Ref Range Status   Specimen Description EXPECTORATED SPUTUM  Final   Special Requests NONE Reflexed from [id number]  Final   Gram Stain   Final    ABUNDANT WBC PRESENT, PREDOMINANTLY PMN FEW GRAM POSITIVE COCCI IN CLUSTERS FEW GRAM POSITIVE COCCI IN PAIRS RARE GRAM POSITIVE RODS    Culture CULTURE REINCUBATED FOR BETTER GROWTH  Final   Report Status PENDING  Incomplete  MRSA PCR Screening     Status: None   Collection Time: 02/28/17  5:55 PM  Result Value Ref Range Status   MRSA by PCR NEGATIVE NEGATIVE Final    Comment:        The GeneXpert MRSA Assay (FDA approved for NASAL specimens only), is one component of a comprehensive MRSA colonization surveillance program. It is not intended to diagnose MRSA infection nor to guide or monitor treatment for MRSA infections.     Radiology  Reports Dg Chest 2 View  Result Date: 02/28/2017 CLINICAL DATA:  66 year old male with flu like illness for 3 days with increasing congestion and shortness of breath. EXAM: CHEST  2 VIEW COMPARISON:  02/04/2017 and earlier FINDINGS: Upright AP and lateral views of the chest. New abnormal anterior basal segment lower lobe opacity on the lateral view which is not well correlated on the frontal view but is probably on the left side. Also new patchy left perihilar superior segment or upper lobe opacity. No pleural effusion. Lower lung volumes. Stable cardiac size and mediastinal contours. Visualized tracheal air column is within normal limits. No acute osseous abnormality identified. Partially visible ACDF in the cervical spine Negative visible bowel gas pattern. IMPRESSION: 1. Multifocal left lung pneumonia.  No pleural effusion. 2. Followup PA and lateral chest X-ray is recommended in 3-4 weeks following trial of antibiotic therapy to ensure resolution and exclude underlying malignancy. Electronically Signed   By: Genevie Ann M.D.   On: 02/28/2017 11:51   Dg Chest 2 View  Result Date: 02/04/2017 CLINICAL DATA:  Shortness of breath began today. Diagnosis with pneumonia 1 week ago. Bilateral pulmonary emboli demonstrated on CT scan today. EXAM: CHEST  2 VIEW COMPARISON:  CT scan of the chest of February 04, 2017 and chest x-ray of January 27, 2017. FINDINGS: The lungs are adequately inflated. The area parenchymal density in the left mid lung on the previous chest x-ray is less conspicuous today. The right hemidiaphragm is slightly higher than the left. The heart and pulmonary vascularity are normal. There is no pleural effusion. The trachea is midline. The bony thorax exhibits no acute abnormality. There is multilevel degenerative disc disease of the thoracic spine. IMPRESSION: No acute pneumonia nor CHF. Mild interstitial prominence, stable. Interval improvement in the patchy density in the left mid lung but subtle  abnormality remains. This may reflect an area of pulmonary infarction. Electronically Signed   By: David  Martinique M.D.   On: 02/04/2017 15:33   Ct Angio Chest Pe W Or Wo Contrast  Result Date: 02/04/2017 CLINICAL DATA:  Shortness of breath.  Recent pneumonia. EXAM: CT ANGIOGRAPHY CHEST WITH CONTRAST TECHNIQUE: Multidetector CT imaging of the chest was performed using the standard protocol during bolus administration of intravenous contrast. Multiplanar CT image reconstructions and MIPs were  obtained to evaluate the vascular anatomy. CONTRAST:  80 cc Isovue 370 COMPARISON:  Chest CT 09/16/2016 FINDINGS: Chest wall: No chest wall mass, supraclavicular or axillary lymphadenopathy. The thyroid gland is grossly normal. Cardiovascular: The heart is normal in size. No pericardial effusion. Mild tortuosity of the thoracic aorta but no aneurysm or dissection. The branch vessels are patent. Scattered coronary artery calcifications. The pulmonary arterial tree is suboptimally opacified but there are definite bilateral pulmonary artery filling defects consistent with pulmonary embolism. This is greater on the right than the left. No findings for right heart strain. Mediastinum/Nodes: Large hiatal hernia. No mediastinal or hilar mass or adenopathy. Lungs/Pleura: Stable bilateral pulmonary nodules. 6 mm right upper lobe nodule on image number 22 is stable. 3 mm right upper lobe nodule on image 34 is stable. 5 mm right lower lobe nodule on image number 41 is stable. 6 mm left lower lobe pulmonary nodule on image number 86 is stable. Two adjacent 6 mm nodular densities in the left upper lobe on image number 43 are new since the prior study but are likely inflammatory. Right lower lobe segmental atelectasis. Possible mild pulmonary edema. No pleural effusions. Upper Abdomen: No significant upper abdominal findings. Stable hepatic cysts. Right renal calculus. Musculoskeletal: No significant bony findings. Review of the MIP images  confirms the above findings. IMPRESSION: 1. Bilateral pulmonary artery emboli. No findings for right heart strain. 2. Multiple stable pulmonary nodules. Two new adjacent nodules in the left lobe are likely inflammatory. Recommend short-term followup noncontrast chest CT (3-6 months.) 3. No mediastinal or hilar mass or adenopathy. Stable large hiatal hernia. These results will be called to the ordering clinician or representative by the Radiologist Assistant, and communication documented in the PACS or zVision Dashboard. Electronically Signed   By: Marijo Sanes M.D.   On: 02/04/2017 13:57     CBC  Recent Labs Lab 02/28/17 1026 03/01/17 0338  WBC 18.6* 18.0*  HGB 13.1 10.6*  HCT 39.7 32.7*  PLT 217 171  MCV 91.9 92.1  MCH 30.3 29.9  MCHC 33.0 32.4  RDW 13.8 14.2  LYMPHSABS 0.7 2.1  MONOABS 0.9 1.0  EOSABS 0.0 0.1  BASOSABS 0.0 0.0    Chemistries   Recent Labs Lab 02/28/17 1026 03/01/17 0338 03/01/17 0346 03/02/17 0424  NA 139 142  --  142  K 3.4* 3.4*  --  3.1*  CL 110 115*  --  114*  CO2 19* 19*  --  20*  GLUCOSE 263* 113*  --  120*  BUN 15 12  --  11  CREATININE 1.56* 1.16  --  1.12  CALCIUM 9.4 8.0*  --  8.8*  MG  --   --  1.8  --   AST 28 20  --   --   ALT 36 25  --   --   ALKPHOS 64 62  --   --   BILITOT 0.7 0.7  --   --    ------------------------------------------------------------------------------------------------------------------ estimated creatinine clearance is 77.1 mL/min (by C-G formula based on SCr of 1.12 mg/dL). ------------------------------------------------------------------------------------------------------------------ No results for input(s): HGBA1C in the last 72 hours. ------------------------------------------------------------------------------------------------------------------ No results for input(s): CHOL, HDL, LDLCALC, TRIG, CHOLHDL, LDLDIRECT in the last 72  hours. ------------------------------------------------------------------------------------------------------------------ No results for input(s): TSH, T4TOTAL, T3FREE, THYROIDAB in the last 72 hours.  Invalid input(s): FREET3 ------------------------------------------------------------------------------------------------------------------ No results for input(s): VITAMINB12, FOLATE, FERRITIN, TIBC, IRON, RETICCTPCT in the last 72 hours.  Coagulation profile  Recent Labs Lab 02/28/17 1026 02/28/17 1335  INR 2.68 2.71    No results for input(s): DDIMER in the last 72 hours.  Cardiac Enzymes No results for input(s): CKMB, TROPONINI, MYOGLOBIN in the last 168 hours.  Invalid input(s): CK ------------------------------------------------------------------------------------------------------------------ Invalid input(s): POCBNP   CBG:  Recent Labs Lab 03/01/17 0820 03/01/17 1207 03/01/17 1814 03/01/17 1934 03/02/17 0755  GLUCAP 155* 159* 111* 178* 126*       Studies: Dg Chest 2 View  Result Date: 02/28/2017 CLINICAL DATA:  66 year old male with flu like illness for 3 days with increasing congestion and shortness of breath. EXAM: CHEST  2 VIEW COMPARISON:  02/04/2017 and earlier FINDINGS: Upright AP and lateral views of the chest. New abnormal anterior basal segment lower lobe opacity on the lateral view which is not well correlated on the frontal view but is probably on the left side. Also new patchy left perihilar superior segment or upper lobe opacity. No pleural effusion. Lower lung volumes. Stable cardiac size and mediastinal contours. Visualized tracheal air column is within normal limits. No acute osseous abnormality identified. Partially visible ACDF in the cervical spine Negative visible bowel gas pattern. IMPRESSION: 1. Multifocal left lung pneumonia.  No pleural effusion. 2. Followup PA and lateral chest X-ray is recommended in 3-4 weeks following trial of antibiotic  therapy to ensure resolution and exclude underlying malignancy. Electronically Signed   By: Genevie Ann M.D.   On: 02/28/2017 11:51      Lab Results  Component Value Date   HGBA1C 6.2 02/29/2016   HGBA1C 5.8 07/18/2015   HGBA1C 6.2 03/21/2015   Lab Results  Component Value Date   MICROALBUR <0.7 02/29/2016   LDLCALC 81 09/19/2016   CREATININE 1.12 03/02/2017       Scheduled Meds: . acidophilus  1 capsule Oral Daily  . allopurinol  100 mg Oral Daily  . cetirizine  10 mg Oral QPM  . gabapentin  300 mg Oral QHS  . insulin aspart  0-9 Units Subcutaneous TID WC  . ipratropium-albuterol  3 mL Nebulization BID  . memantine  20 mg Oral Daily  . pantoprazole  40 mg Oral BID  . piperacillin-tazobactam (ZOSYN)  IV  3.375 g Intravenous Q8H  . potassium chloride SA  20 mEq Oral Daily  . pravastatin  10 mg Oral Daily  . rivaroxaban  20 mg Oral Daily  . terazosin  5 mg Oral QHS  . topiramate  25 mg Oral Daily  . topiramate  50 mg Oral QHS  . vancomycin  750 mg Intravenous Q12H   Continuous Infusions:    LOS: 2 days    Time spent: >30 MINS    [facility]  [facility] Pager [phone]. If 7PM-7AM, please contact night-coverage at [email or web address], password [facility] 03/02/2017, 9:59 AM  LOS: 2 days

## 2017-03-03 ENCOUNTER — Other Ambulatory Visit (HOSPITAL_COMMUNITY): Payer: PPO

## 2017-03-03 ENCOUNTER — Inpatient Hospital Stay (HOSPITAL_COMMUNITY): Payer: PPO

## 2017-03-03 ENCOUNTER — Ambulatory Visit: Payer: PPO | Admitting: Internal Medicine

## 2017-03-03 ENCOUNTER — Telehealth: Payer: Self-pay | Admitting: Family Medicine

## 2017-03-03 DIAGNOSIS — I2699 Other pulmonary embolism without acute cor pulmonale: Secondary | ICD-10-CM

## 2017-03-03 DIAGNOSIS — R05 Cough: Secondary | ICD-10-CM

## 2017-03-03 DIAGNOSIS — R059 Cough, unspecified: Secondary | ICD-10-CM

## 2017-03-03 LAB — ECHOCARDIOGRAM COMPLETE
CHL CUP RV SYS PRESS: 37 mmHg
E decel time: 271 msec
E/e' ratio: 9.51
FS: 34 % (ref 28–44)
Height: 69 in
IV/PV OW: 0.9
LA diam end sys: 40 mm
LA vol index: 30 mL/m2
LADIAMINDEX: 1.83 cm/m2
LASIZE: 40 mm
LAVOL: 65.5 mL
LAVOLA4C: 70.4 mL
LDCA: 2.54 cm2
LV E/e'average: 9.51
LVEEMED: 9.51
LVELAT: 7.7 cm/s
LVOT diameter: 18 mm
Lateral S' vel: 21.4 cm/s
MV Dec: 271
MVPG: 2 mmHg
MVPKAVEL: 86.8 m/s
MVPKEVEL: 73.2 m/s
PW: 10.5 mm — AB (ref 0.6–1.1)
RV TAPSE: 18 mm
Reg peak vel: 291 cm/s
TDI e' lateral: 7.7
TDI e' medial: 7.93
TR max vel: 291 cm/s
Weight: 3632 oz

## 2017-03-03 LAB — CULTURE, RESPIRATORY

## 2017-03-03 LAB — CULTURE, RESPIRATORY W GRAM STAIN: Culture: NORMAL

## 2017-03-03 LAB — BASIC METABOLIC PANEL
Anion gap: 9 (ref 5–15)
BUN: 12 mg/dL (ref 6–20)
CALCIUM: 9 mg/dL (ref 8.9–10.3)
CHLORIDE: 114 mmol/L — AB (ref 101–111)
CO2: 18 mmol/L — AB (ref 22–32)
CREATININE: 1.07 mg/dL (ref 0.61–1.24)
GFR calc Af Amer: >60 mL/min (ref >60)
GFR calc non Af Amer: >60 mL/min (ref >60)
GLUCOSE: 143 mg/dL — AB (ref 65–99)
Potassium: 3.5 mmol/L (ref 3.5–5.1)
Sodium: 141 mmol/L (ref 135–145)

## 2017-03-03 LAB — GLUCOSE, CAPILLARY
GLUCOSE-CAPILLARY: 142 mg/dL — AB (ref 65–99)
Glucose-Capillary: 136 mg/dL — ABNORMAL HIGH (ref 65–99)

## 2017-03-03 LAB — TROPONIN I: Troponin I: <0.03 ng/mL (ref <0.03)

## 2017-03-03 MED ORDER — POTASSIUM CHLORIDE CRYS ER 20 MEQ PO TBCR: 40.0000 meq | EXTENDED_RELEASE_TABLET | Freq: Once | ORAL | Status: DC

## 2017-03-03 MED ORDER — TERAZOSIN HCL 5 MG PO CAPS: ORAL_CAPSULE | ORAL | 0 refills | Status: DC

## 2017-03-03 MED ORDER — MAGNESIUM OXIDE 400 (241.3 MG) MG PO TABS: 400.0000 mg | ORAL_TABLET | Freq: Every day | ORAL | 1 refills | Status: DC

## 2017-03-03 MED ORDER — AZITHROMYCIN 500 MG PO TABS: 500.0000 mg | ORAL_TABLET | Freq: Every day | ORAL | 0 refills | Status: AC

## 2017-03-03 MED ORDER — METOPROLOL TARTRATE 25 MG PO TABS: 25.0000 mg | ORAL_TABLET | Freq: Every day | ORAL | Status: DC

## 2017-03-03 MED ORDER — POTASSIUM CHLORIDE CRYS ER 20 MEQ PO TBCR: 40.0000 meq | EXTENDED_RELEASE_TABLET | Freq: Every day | ORAL | 1 refills | Status: DC

## 2017-03-03 MED ORDER — MOMETASONE FURO-FORMOTEROL FUM 200-5 MCG/ACT IN AERO
2.0000 | INHALATION_SPRAY | Freq: Two times a day (BID) | RESPIRATORY_TRACT | Status: DC
  Administered 2017-03-03: 2 via RESPIRATORY_TRACT
  Filled 2017-03-03: qty 8.8

## 2017-03-03 MED ORDER — CEFPODOXIME PROXETIL 200 MG PO TABS: 200.0000 mg | ORAL_TABLET | Freq: Two times a day (BID) | ORAL | 0 refills | Status: AC

## 2017-03-03 MED ORDER — VERAPAMIL HCL ER 240 MG PO TBCR: 240.0000 mg | EXTENDED_RELEASE_TABLET | Freq: Every day | ORAL | 1 refills | Status: DC

## 2017-03-03 MED ORDER — POLYETHYLENE GLYCOL 3350 17 G PO PACK: 17.0000 g | PACK | Freq: Every day | ORAL | 0 refills | Status: DC | PRN

## 2017-03-03 MED ORDER — LEVOCETIRIZINE DIHYDROCHLORIDE 5 MG PO TABS: 5.0000 mg | ORAL_TABLET | Freq: Every evening | ORAL | 1 refills | Status: DC

## 2017-03-03 MED ORDER — MOMETASONE FURO-FORMOTEROL FUM 200-5 MCG/ACT IN AERO: 2.0000 | INHALATION_SPRAY | Freq: Two times a day (BID) | RESPIRATORY_TRACT | 11 refills | Status: DC

## 2017-03-03 MED FILL — XARELTO 20 MG TABLET: 20 | 90 days supply | Qty: 90 | Fill #0

## 2017-03-03 MED FILL — CEFPODOXIME 200 MG TABLET: 200 | 5 days supply | Qty: 10 | Fill #0

## 2017-03-03 MED FILL — AZITHROMYCIN 500 MG TABLET: 500 | 5 days supply | Qty: 5 | Fill #0

## 2017-03-03 MED FILL — POLYETHYLENE GLYCOL 3350: 15 days supply | Qty: 255 | Fill #0

## 2017-03-03 MED FILL — POTASSIUM CL ER 20 MEQ TAB: 20 | 45 days supply | Qty: 90 | Fill #0

## 2017-03-03 NOTE — Telephone Encounter (Signed)
Pt's spouse called in because she said that pt's Rx were sent to the incorrect pharmacy yesterday. She would like to have them go to the [facility] instead.

## 2017-03-03 NOTE — Progress Notes (Signed)
  Echocardiogram 2D Echocardiogram has been performed.  Garrett [name] 03/03/2017, 10:43 AM

## 2017-03-03 NOTE — Progress Notes (Signed)
Name: Garrett [name] MRN: [id number] DOB: 04-17-1951    ADMISSION DATE:  02/28/2017 CONSULTATION DATE: 03/01/2017   REFERRING MD :  Dr. Allyson Sabal   CHIEF COMPLAINT:  Dyspnea   BRIEF PATIENT DESCRIPTION:  66 yo male presented with cough, chills.  Found to have b/l pneumonia.  Was recently dx with PE and started on xarelto  SIGNIFICANT EVENTS  3/9 > Presented to ED with Cough/Dyspnea   STUDIES:  CT angio chest 02/04/17 >> b/l PE, multiple pulmonary nodules CXR 3/9 > Multifocal left lung PNA, no pleural effusion CXR 3/12 > Areas of patchy atelectatic change in the left mid and lower lung zones with concern for early PNA left base, partial clearing of consolidation left upper lobe since most recent prior study, right lung remains clear ECHO 3/12 >>   Antibiotics: Vancomycin 3/09 >> 3/11 Zosyn 3/09 >> 3/11 Zithromax 3/11 >> Rocephin 3/11 >>  Cultures: Influenza PCR 3/09 > negative Respiratory viral panel 3/09 > negative Pneumococcal Ag 3/09 > negative Sputum 3/09 > Few GPC in clusters/pairs, rare gram positive rods   Blood 3/09 > Neg to Date >> MRSA PCR 3/9 > Neg  SUBJECTIVE:  Feels breathing is getting better, able to ambulate well, feels like he is still not at baseline as when he takes a deep breath it takes him a longer timer to recover   VITAL SIGNS: Temp:  [97.6 F (36.4 C)-98.9 F (37.2 C)] 97.6 F (36.4 C) (03/12 0551) Pulse Rate:  [74-95] 95 (03/12 0831) Resp:  [18-24] 18 (03/12 0831) BP: (122-133)/(67-87) 122/87 (03/12 0551) SpO2:  [93 %-98 %] 94 % (03/12 0831) Weight:  [103 kg (227 lb)] 103 kg (227 lb) (03/11 1518)  PHYSICAL EXAMINATION: General:  Adult male, no distress  Neuro:  Alert, oriented, grossly intact  HEENT:  Normocephalic  Cardiovascular:  RRR, no MRG, NI S1/S2 Lungs:  Diminished breath sounds to bases, no wheezes/crackles, non-labored  Abdomen:  Obese, non-tender, active bowel sounds  Musculoskeletal:  No acute  Skin:  Warm, dry, intact     Recent Labs Lab 03/01/17 0338 03/02/17 0424 03/03/17 0036  NA 142 142 141  K 3.4* 3.1* 3.5  CL 115* 114* 114*  CO2 19* 20* 18*  BUN 12 11 12   CREATININE 1.16 1.12 1.07  GLUCOSE 113* 120* 143*    Recent Labs Lab 02/28/17 1026 03/01/17 0338  HGB 13.1 10.6*  HCT 39.7 32.7*  WBC 18.6* 18.0*  PLT 217 171   Dg Chest 2 View  Result Date: 03/03/2017 CLINICAL DATA:  Shortness of breath and cough.  Chest pain EXAM: CHEST  2 VIEW COMPARISON:  February 28, 2017 FINDINGS: In comparison with most recent study, there has been partial clearing of consolidation from the left upper lobe. There is patchy atelectatic change throughout the left mid and lower lung zones, likely atelectasis with questionable early pneumonia left base. Right lung is clear. Heart is upper normal in size with pulmonary vascularity within normal limits. No adenopathy. There is a small hiatal hernia. There is postoperative change in the lower cervical spine as well as in the visualized upper lumbar region. IMPRESSION: Areas of patchy atelectatic change in the left mid and lower lung zones with concern for early pneumonia left base. Partial clearing of consolidation left upper lobe since most recent prior study. Right lung remains clear. Stable cardiac silhouette. Small hiatal hernia. Electronically Signed   By: Lowella Grip III M.D.   On: 03/03/2017 08:02    ASSESSMENT / PLAN:  Acute Hypoxic Respiratory Failure secondary to sepsis in the setting of Left Sided CAP  H/O OSA  Plan -Maintain Oxygen Saturation >92 (Currently on Room Air)  -Pulmonary Hygiene > IS, Flutter Valve, Mobilize  -Atrovent PRN  -Scheduled Dulera > resume home Symbicort at discharge  -Continue Antibiotics as above  -CPAP at qHS  -Follow CXR -Will F/U outpatient in 1-2 weeks   H/O Pulmonary Embolism  Plan -Continue Xarelto  H/O HTN  Plan -ECHO pending   PCCM will sign off, if needed please re-consult.    Hayden Pedro,  AG-ACNP [facility]  Pgr: [phone]  PCCM Pgr: [phone]  STAFF NOTE: Linwood Dibbles, MD FACP have personally reviewed patient's available data, including medical history, events of note, physical examination and test results as part of my evaluation. I have discussed with resident/NP and other care providers such as pharmacist, RN and RRT. In addition, I personally evaluated patient and elicited key findings of: about half better per pt, no sob, cough is present,no fevers, coarse BS throughout mild, good air entry, no wheezing, no edema, ambulating, neg 350 cc, pcxr is NOT impressive for lobar infiltrate and this does not appear to be related to PE/ inifarct atall, he reminds me of atypical illness, would use azithro x 5 days and consider dc ctx, if he was stagnent in progress would then use steroids for pnuemonitis , wil sign off cal if needed   Lavon Paganini. Titus Mould, MD, [place] Pgr: [facility] 03/03/2017 12:01 PM

## 2017-03-03 NOTE — Discharge Summary (Addendum)
Physician Discharge Summary  Garrett [name] MRN: [id number] DOB/AGE: 1951/03/15 66 y.o.  PCP: Ann Held, DO   Admit date: 02/28/2017 Discharge date: 03/03/2017  Discharge Diagnoses:    Principal Problem:   Sepsis [facility]) Active Problems:   Controlled type 2 diabetes mellitus with diabetic nephropathy ([place])   Gout   Essential hypertension   Migraine headache without aura   Hyperlipidemia associated with type 2 diabetes mellitus (HCC)   Mild dementia   Garrett (obstructive sleep apnea)   Multiple pulmonary nodules   Pulmonary emboli ([place])   CAP (community acquired pneumonia)    Follow-up recommendations Follow-up with PCP in 3-5 days , including all  additional recommended appointments as below Follow-up CBC, CMP in 3-5 days Follow-up with pulmonary in 1-2 weeks Avapro discontinued due to low blood pressure Resume Hytrin and verapamil, if okay with PCP in 1-2 weeks     Current Discharge Medication List    START taking these medications   Details  azithromycin (ZITHROMAX) 500 MG tablet Take 1 tablet (500 mg total) by mouth daily. Qty: 5 tablet, Refills: 0    cefpodoxime (VANTIN) 200 MG tablet Take 1 tablet (200 mg total) by mouth 2 (two) times daily. Qty: 10 tablet, Refills: 0    magnesium oxide (MAG-OX) 400 (241.3 Mg) MG tablet Take 1 tablet (400 mg total) by mouth daily. Qty: 30 tablet, Refills: 1    mometasone-formoterol (DULERA) 200-5 MCG/ACT AERO Inhale 2 puffs into the lungs 2 (two) times daily. Qty: 1 Inhaler, Refills: 11    polyethylene glycol (MIRALAX / GLYCOLAX) packet Take 17 g by mouth daily as needed for mild constipation. Qty: 14 each, Refills: 0      CONTINUE these medications which have CHANGED   Details  metoprolol (LOPRESSOR) 25 MG tablet Take 1 tablet (25 mg total) by mouth daily.   Associated Diagnoses: Essential hypertension    terazosin (HYTRIN) 5 MG capsule TAKE 1 CAPSULE BY MOUTH ONCE AT BEDTIME Qty: 30 capsule, Refills: 0     verapamil (CALAN-SR) 240 MG CR tablet Take 1 tablet (240 mg total) by mouth at bedtime. Qty: 90 tablet, Refills: 1   Associated Diagnoses: Essential hypertension      CONTINUE these medications which have NOT CHANGED   Details  acidophilus (RISAQUAD) CAPS capsule Take 1 capsule by mouth daily.    allopurinol (ZYLOPRIM) 100 MG tablet Take 1 tablet (100 mg total) by mouth daily. Qty: 90 tablet, Refills: 3   Associated Diagnoses: Idiopathic gout, unspecified chronicity, unspecified site    cyanocobalamin 500 MCG tablet Take 500 mcg by mouth daily.      furosemide (LASIX) 20 MG tablet Take 1 tablet (20 mg total) by mouth daily. Qty: 90 tablet, Refills: 3   Associated Diagnoses: Essential hypertension    gabapentin (NEURONTIN) 300 MG capsule Take 1 capsule (300 mg total) by mouth at bedtime. Qty: 90 capsule, Refills: 1    ipratropium (ATROVENT HFA) 17 MCG/ACT inhaler Inhale 2 puffs into the lungs every 6 (six) hours.    memantine (NAMENDA) 10 MG tablet Take 1 tablet (10 mg total) by mouth 2 (two) times daily. Qty: 180 tablet, Refills: 3   Associated Diagnoses: Memory loss    metFORMIN (GLUCOPHAGE) 500 MG tablet Take 500 mg by mouth daily with breakfast.    Multiple Vitamins-Minerals (ONE-A-DAY WEIGHT SMART ADVANCE PO) Take 1 tablet by mouth daily.     pantoprazole (PROTONIX) 40 MG tablet Take 1 tablet (40 mg total) by mouth 2 (two)  times daily. Qty: 180 tablet, Refills: 3   Associated Diagnoses: Gastroesophageal reflux disease, esophagitis presence not specified    pravastatin (PRAVACHOL) 10 MG tablet Take 1 tablet (10 mg total) by mouth daily. Qty: 90 tablet, Refills: 3   Associated Diagnoses: Hyperlipidemia LDL goal <70    rivaroxaban (XARELTO) 20 MG TABS tablet Take 1 tablet (20 mg total) by mouth daily. Qty: 90 tablet, Refills: 1    topiramate (TOPAMAX) 25 MG tablet Take 1 tablet (25 mg total) by mouth 3 (three) times daily. Qty: 270 tablet, Refills: 3   Associated  Diagnoses: Hx of migraines    EPINEPHrine (EPIPEN) 0.3 mg/0.3 mL SOAJ injection Inject 0.3 mg into the muscle once.    levocetirizine (XYZAL) 5 MG tablet Take 1 tablet (5 mg total) by mouth every evening. Qty: 90 tablet, Refills: 1   Associated Diagnoses: Cough    potassium chloride SA (K-DUR,KLOR-CON) 20 MEQ tablet Take 2 tablets (40 mEq total) by mouth daily. Qty: 90 tablet, Refills: 1      STOP taking these medications     celecoxib (CELEBREX) 200 MG capsule      irbesartan (AVAPRO) 150 MG tablet      mometasone (NASONEX) 50 MCG/ACT nasal spray           Discharge Condition: *Stable   Discharge Instructions Get Medicines reviewed and adjusted: Please take all your medications with you for your next visit with your Primary MD  Please request your Primary MD to go over all hospital tests and procedure/radiological results at the follow up, please ask your Primary MD to get all Hospital records sent to his/her office.  If you experience worsening of your admission symptoms, develop shortness of breath, life threatening emergency, suicidal or homicidal thoughts you must seek medical attention immediately by calling 911 or calling your MD immediately if symptoms less severe.  You must read complete instructions/literature along with all the possible adverse reactions/side effects for all the Medicines you take and that have been prescribed to you. Take any new Medicines after you have completely understood and accpet all the possible adverse reactions/side effects.   Do not drive when taking Pain medications.   Do not take more than prescribed Pain, Sleep and Anxiety Medications  Special Instructions: If you have smoked or chewed Tobacco in the last 2 yrs please stop smoking, stop any regular Alcohol and or any Recreational drug use.  Wear Seat belts while driving.  Please note  You were cared for by a hospitalist during your hospital stay. Once you are discharged,  your primary care physician will handle any further medical issues. Please note that NO REFILLS for any discharge medications will be authorized once you are discharged, as it is imperative that you return to your primary care physician (or establish a relationship with a primary care physician if you do not have one) for your aftercare needs so that they can reassess your need for medications and monitor your lab values.     Allergies  Allergen Reactions  . Bee Venom Anaphylaxis  . Garlic Swelling      Disposition: 01-Home or Self Care   Consults:  Pulmonary     Significant Diagnostic Studies:  Dg Chest 2 View  Result Date: 03/03/2017 CLINICAL DATA:  Shortness of breath and cough.  Chest pain EXAM: CHEST  2 VIEW COMPARISON:  February 28, 2017 FINDINGS: In comparison with most recent study, there has been partial clearing of consolidation from the left upper lobe. There  is patchy atelectatic change throughout the left mid and lower lung zones, likely atelectasis with questionable early pneumonia left base. Right lung is clear. Heart is upper normal in size with pulmonary vascularity within normal limits. No adenopathy. There is a small hiatal hernia. There is postoperative change in the lower cervical spine as well as in the visualized upper lumbar region. IMPRESSION: Areas of patchy atelectatic change in the left mid and lower lung zones with concern for early pneumonia left base. Partial clearing of consolidation left upper lobe since most recent prior study. Right lung remains clear. Stable cardiac silhouette. Small hiatal hernia. Electronically Signed   By: Lowella Grip III M.D.   On: 03/03/2017 08:02   Dg Chest 2 View  Result Date: 02/28/2017 CLINICAL DATA:  66 year old male with flu like illness for 3 days with increasing congestion and shortness of breath. EXAM: CHEST  2 VIEW COMPARISON:  02/04/2017 and earlier FINDINGS: Upright AP and lateral views of the chest. New abnormal  anterior basal segment lower lobe opacity on the lateral view which is not well correlated on the frontal view but is probably on the left side. Also new patchy left perihilar superior segment or upper lobe opacity. No pleural effusion. Lower lung volumes. Stable cardiac size and mediastinal contours. Visualized tracheal air column is within normal limits. No acute osseous abnormality identified. Partially visible ACDF in the cervical spine Negative visible bowel gas pattern. IMPRESSION: 1. Multifocal left lung pneumonia.  No pleural effusion. 2. Followup PA and lateral chest X-ray is recommended in 3-4 weeks following trial of antibiotic therapy to ensure resolution and exclude underlying malignancy. Electronically Signed   By: Genevie Ann M.D.   On: 02/28/2017 11:51   Dg Chest 2 View  Result Date: 02/04/2017 CLINICAL DATA:  Shortness of breath began today. Diagnosis with pneumonia 1 week ago. Bilateral pulmonary emboli demonstrated on CT scan today. EXAM: CHEST  2 VIEW COMPARISON:  CT scan of the chest of February 04, 2017 and chest x-ray of January 27, 2017. FINDINGS: The lungs are adequately inflated. The area parenchymal density in the left mid lung on the previous chest x-ray is less conspicuous today. The right hemidiaphragm is slightly higher than the left. The heart and pulmonary vascularity are normal. There is no pleural effusion. The trachea is midline. The bony thorax exhibits no acute abnormality. There is multilevel degenerative disc disease of the thoracic spine. IMPRESSION: No acute pneumonia nor CHF. Mild interstitial prominence, stable. Interval improvement in the patchy density in the left mid lung but subtle abnormality remains. This may reflect an area of pulmonary infarction. Electronically Signed   By: David  Martinique M.D.   On: 02/04/2017 15:33   Ct Angio Chest Pe W Or Wo Contrast  Result Date: 02/04/2017 CLINICAL DATA:  Shortness of breath.  Recent pneumonia. EXAM: CT ANGIOGRAPHY CHEST  WITH CONTRAST TECHNIQUE: Multidetector CT imaging of the chest was performed using the standard protocol during bolus administration of intravenous contrast. Multiplanar CT image reconstructions and MIPs were obtained to evaluate the vascular anatomy. CONTRAST:  80 cc Isovue 370 COMPARISON:  Chest CT 09/16/2016 FINDINGS: Chest wall: No chest wall mass, supraclavicular or axillary lymphadenopathy. The thyroid gland is grossly normal. Cardiovascular: The heart is normal in size. No pericardial effusion. Mild tortuosity of the thoracic aorta but no aneurysm or dissection. The branch vessels are patent. Scattered coronary artery calcifications. The pulmonary arterial tree is suboptimally opacified but there are definite bilateral pulmonary artery filling defects consistent with pulmonary  embolism. This is greater on the right than the left. No findings for right heart strain. Mediastinum/Nodes: Large hiatal hernia. No mediastinal or hilar mass or adenopathy. Lungs/Pleura: Stable bilateral pulmonary nodules. 6 mm right upper lobe nodule on image number 22 is stable. 3 mm right upper lobe nodule on image 34 is stable. 5 mm right lower lobe nodule on image number 41 is stable. 6 mm left lower lobe pulmonary nodule on image number 86 is stable. Two adjacent 6 mm nodular densities in the left upper lobe on image number 43 are new since the prior study but are likely inflammatory. Right lower lobe segmental atelectasis. Possible mild pulmonary edema. No pleural effusions. Upper Abdomen: No significant upper abdominal findings. Stable hepatic cysts. Right renal calculus. Musculoskeletal: No significant bony findings. Review of the MIP images confirms the above findings. IMPRESSION: 1. Bilateral pulmonary artery emboli. No findings for right heart strain. 2. Multiple stable pulmonary nodules. Two new adjacent nodules in the left lobe are likely inflammatory. Recommend short-term followup noncontrast chest CT (3-6 months.) 3. No  mediastinal or hilar mass or adenopathy. Stable large hiatal hernia. These results will be called to the ordering clinician or representative by the Radiologist Assistant, and communication documented in the PACS or zVision Dashboard. Electronically Signed   By: Marijo Sanes M.D.   On: 02/04/2017 13:57    echocardiogram Pending     Filed Weights   02/28/17 1006 02/28/17 1742 03/02/17 1518  Weight: 99.8 kg (220 lb) 103.9 kg (229 lb 0.9 oz) 103 kg (227 lb)     Microbiology: Recent Results (from the past 240 hour(s))  Culture, blood (Routine x 2)     Status: None (Preliminary result)   Collection Time: 02/28/17 10:31 AM  Result Value Ref Range Status   Specimen Description BLOOD LEFT ANTECUBITAL  Final   Special Requests BOTTLES DRAWN AEROBIC ONLY  10CC  Final   Culture NO GROWTH 2 DAYS  Final   Report Status PENDING  Incomplete  Culture, blood (Routine x 2)     Status: None (Preliminary result)   Collection Time: 02/28/17 10:39 AM  Result Value Ref Range Status   Specimen Description BLOOD RIGHT HAND  Final   Special Requests BOTTLES DRAWN AEROBIC ONLY  10CC  Final   Culture NO GROWTH 2 DAYS  Final   Report Status PENDING  Incomplete  Respiratory Panel by PCR     Status: None   Collection Time: 02/28/17 10:56 AM  Result Value Ref Range Status   Adenovirus NOT DETECTED NOT DETECTED Final   Coronavirus 229E NOT DETECTED NOT DETECTED Final   Coronavirus HKU1 NOT DETECTED NOT DETECTED Final   Coronavirus NL63 NOT DETECTED NOT DETECTED Final   Coronavirus OC43 NOT DETECTED NOT DETECTED Final   Metapneumovirus NOT DETECTED NOT DETECTED Final   Rhinovirus / Enterovirus NOT DETECTED NOT DETECTED Final   Influenza A NOT DETECTED NOT DETECTED Final   Influenza B NOT DETECTED NOT DETECTED Final   Parainfluenza Virus 1 NOT DETECTED NOT DETECTED Final   Parainfluenza Virus 2 NOT DETECTED NOT DETECTED Final   Parainfluenza Virus 3 NOT DETECTED NOT DETECTED Final   Parainfluenza Virus 4  NOT DETECTED NOT DETECTED Final   Respiratory Syncytial Virus NOT DETECTED NOT DETECTED Final   Bordetella pertussis NOT DETECTED NOT DETECTED Final   Chlamydophila pneumoniae NOT DETECTED NOT DETECTED Final   Mycoplasma pneumoniae NOT DETECTED NOT DETECTED Final  Culture, expectorated sputum-assessment     Status: None  Collection Time: 02/28/17  3:57 PM  Result Value Ref Range Status   Specimen Description EXPECTORATED SPUTUM  Final   Special Requests NONE  Final   Sputum evaluation THIS SPECIMEN IS ACCEPTABLE FOR SPUTUM CULTURE  Final   Report Status 02/28/2017 FINAL  Final  Culture, respiratory (NON-Expectorated)     Status: None (Preliminary result)   Collection Time: 02/28/17  3:57 PM  Result Value Ref Range Status   Specimen Description EXPECTORATED SPUTUM  Final   Special Requests NONE Reflexed from [id number]  Final   Gram Stain   Final    ABUNDANT WBC PRESENT, PREDOMINANTLY PMN FEW GRAM POSITIVE COCCI IN CLUSTERS FEW GRAM POSITIVE COCCI IN PAIRS RARE GRAM POSITIVE RODS    Culture CULTURE REINCUBATED FOR BETTER GROWTH  Final   Report Status PENDING  Incomplete  MRSA PCR Screening     Status: None   Collection Time: 02/28/17  5:55 PM  Result Value Ref Range Status   MRSA by PCR NEGATIVE NEGATIVE Final    Comment:        The GeneXpert MRSA Assay (FDA approved for NASAL specimens only), is one component of a comprehensive MRSA colonization surveillance program. It is not intended to diagnose MRSA infection nor to guide or monitor treatment for MRSA infections.        Blood Culture    Component Value Date/Time   SDES EXPECTORATED SPUTUM 02/28/2017 1557   SDES EXPECTORATED SPUTUM 02/28/2017 1557   SPECREQUEST NONE 02/28/2017 1557   SPECREQUEST NONE Reflexed from [id number] 02/28/2017 1557   CULT CULTURE REINCUBATED FOR BETTER GROWTH 02/28/2017 1557   REPTSTATUS 02/28/2017 FINAL 02/28/2017 1557   REPTSTATUS PENDING 02/28/2017 1557      Labs: Results for orders  placed or performed during the hospital encounter of 02/28/17 (from the past 48 hour(s))  Glucose, capillary     Status: Abnormal   Collection Time: 03/01/17 12:07 PM  Result Value Ref Range   Glucose-Capillary 159 (H) 65 - 99 mg/dL  Glucose, capillary     Status: Abnormal   Collection Time: 03/01/17  6:14 PM  Result Value Ref Range   Glucose-Capillary 111 (H) 65 - 99 mg/dL  Glucose, capillary     Status: Abnormal   Collection Time: 03/01/17  7:34 PM  Result Value Ref Range   Glucose-Capillary 178 (H) 65 - 99 mg/dL  Basic metabolic panel     Status: Abnormal   Collection Time: 03/02/17  4:24 AM  Result Value Ref Range   Sodium 142 135 - 145 mmol/L   Potassium 3.1 (L) 3.5 - 5.1 mmol/L   Chloride 114 (H) 101 - 111 mmol/L   CO2 20 (L) 22 - 32 mmol/L   Glucose, Bld 120 (H) 65 - 99 mg/dL   BUN 11 6 - 20 mg/dL   Creatinine, Ser 1.12 0.61 - 1.24 mg/dL   Calcium 8.8 (L) 8.9 - 10.3 mg/dL   GFR calc non Af Amer >60 >60 mL/min   GFR calc Af Amer >60 >60 mL/min    Comment: (NOTE) The eGFR has been calculated using the CKD EPI equation. This calculation has not been validated in all clinical situations. eGFR's persistently <60 mL/min signify possible Chronic Kidney Disease.    Anion gap 8 5 - 15  Glucose, capillary     Status: Abnormal   Collection Time: 03/02/17  7:55 AM  Result Value Ref Range   Glucose-Capillary 126 (H) 65 - 99 mg/dL  Glucose, capillary     Status: Abnormal  Collection Time: 03/02/17  5:58 PM  Result Value Ref Range   Glucose-Capillary 117 (H) 65 - 99 mg/dL  Glucose, capillary     Status: Abnormal   Collection Time: 03/02/17 10:58 PM  Result Value Ref Range   Glucose-Capillary 113 (H) 65 - 99 mg/dL  Basic metabolic panel     Status: Abnormal   Collection Time: 03/03/17 12:36 AM  Result Value Ref Range   Sodium 141 135 - 145 mmol/L   Potassium 3.5 3.5 - 5.1 mmol/L   Chloride 114 (H) 101 - 111 mmol/L   CO2 18 (L) 22 - 32 mmol/L   Glucose, Bld 143 (H) 65 - 99  mg/dL   BUN 12 6 - 20 mg/dL   Creatinine, Ser 1.07 0.61 - 1.24 mg/dL   Calcium 9.0 8.9 - 10.3 mg/dL   GFR calc non Af Amer >60 >60 mL/min   GFR calc Af Amer >60 >60 mL/min    Comment: (NOTE) The eGFR has been calculated using the CKD EPI equation. This calculation has not been validated in all clinical situations. eGFR's persistently <60 mL/min signify possible Chronic Kidney Disease.    Anion gap 9 5 - 15  Troponin I     Status: None   Collection Time: 03/03/17 12:36 AM  Result Value Ref Range   Troponin I <0.03 <0.03 ng/mL  Glucose, capillary     Status: Abnormal   Collection Time: 03/03/17  8:41 AM  Result Value Ref Range   Glucose-Capillary 136 (H) 65 - 99 mg/dL     Lipid Panel     Component Value Date/Time   CHOL 152 09/19/2016 0958   CHOL 162 07/18/2015 0744   TRIG 141.0 09/19/2016 0958   TRIG 110 07/18/2015 0744   HDL 42.80 09/19/2016 0958   HDL 41 07/18/2015 0744   CHOLHDL 4 09/19/2016 0958   VLDL 28.2 09/19/2016 0958   LDLCALC 81 09/19/2016 0958   LDLCALC 99 07/18/2015 0744   LDLDIRECT 115.4 07/01/2013 0914     Lab Results  Component Value Date   HGBA1C 6.2 02/29/2016   HGBA1C 5.8 07/18/2015   HGBA1C 6.2 03/21/2015      HPI :*  66 y.o.malewith medical history significant of recent PE, DM type II, HTN, Hyperlipidemia, mild dementia, gout who presented to the ED twith c/o pleuritic chest and shaky chills , patient found to have multiple pulmonary nodules, multifocal left lung pneumonia, recent Pulmonary embolism, currently 99% on room but was hypotensive on admission, with a presenting blood pressure of 71/ 51. Critical care was consulted and they recommend stepdown admission with volume resuscitation and broad-spectrum antibiotics for pneumonia. Influenza PCR negative ED Course: On arrival to the ED Patient was febrile with temperature 100.16F, he was hypotensive with blood pressure 83/52 mmHg, blood cells count was elevated at 18,600, mild hypokalemia  with potassium of 3.4 and elevated troponin at 1.56 with previously normal creatinine last month Lactic acid was elevated at 3.97 Chest x-ray showed multifocal  left lung Patient was placed on IV fluids according to sepsis protocol by an emergency department M.D.   HOSPITAL COURSE: *   Sepsis associated with left-sided pneumonia Patient initially received total of 3 L NS IV, blood pressure parameters have improved. lactic acid improving, Pro calcitonin 4.08 upon admission Respiratory panel and influenza panel negative Initially started on vancomycin and Zosyn, antibiotics now switched to azithromycin and Vantin for 5 more days  blood cultures-no growth so far Evaluated by pulmonary due to hypoxic respiratory failure  in the setting of pneumonia and bilateral pulmonary embolism-patient is slowly improving currently 92% on room air, recommended to continue CPap daily at bedtime Chest x-ray and pulmonary follow-up in one to 2 weeks  Left-sided pneumonia - most likely community-acquired Continue  antibiotics, Duoneb treatments, expectorant if needed, supplemental O2 Rest of the treatment as above  DM type II - last HgbA1C is 6.2% in March 2017 Resume Glucophage Continue diabetic diet,    AKI - most likely associated with acute illness, creatinine is improved from 1.56-1.16, now 1.07 Hold Celebrex, on renal dose Zyloprim for gout Continue to monitor renal function  Hypertension - hold antihypertensives (Beta blocker, Ca CB and ARB) and diuretic home medication d/t hypotensionand restarted low-dose beta blocker upon discharge Presumed Hytrin and verapamil once evaluated by PCP Avapro has been discontinued  Mild dementia - continue Namenda and provide supportive care  Hyperlipidemia  Continue statin therapy  Hypokalemia Keep potassium greater than 4, magnesium greater than 2  Migraine HA Continue Topamax  Discharge Exam: *  Blood pressure 122/87, pulse 95, temperature  97.6 F (36.4 C), temperature source Oral, resp. rate 18, height _0  (1.753 m), weight 103 kg (227 lb), SpO2 94 %.      Follow-up Information    [facility] Follow up on 03/17/2017.   Specialty:  Pulmonology Why:  3/26 0900.  Contact information: Mallard Avon Bolivar, DO. Call.   Specialty:  Family Medicine Why:  Hospital follow-up Contact information: [address] [place] [address] [phone]           Signed: Reyne Dumas 03/03/2017, 10:10 AM        Time spent >45 mins

## 2017-03-03 NOTE — Telephone Encounter (Signed)
Called the patient sent meds to [place].

## 2017-03-03 NOTE — Evaluation (Signed)
Physical Therapy Evaluation Patient Details Name: Garrett [name] MRN: [id number] DOB: Oct 13, 1951 Today's Date: 03/03/2017   History of Present Illness  66 yo male presented with cough, chills.  Found to have b/l pneumonia.  Was recently dx with PE and started on xarelto  Clinical Impression  Patient seen for O2 saturation assessment and mobility. Patient was steady with ambulation. ambulated on room air with saturations >93% thorughout activity HR from 92-116. DOE 2-3/4 but no significant limits in activity or function noted. Educated patient on pursed lip breathing and energy conservation. No further acute PT needs, patient Independent will sign off.    Follow Up Recommendations No PT follow up    Equipment Recommendations  None recommended by PT    Recommendations for Other Services       Precautions / Restrictions        Mobility  Bed Mobility Overal bed mobility: Independent                Transfers Overall transfer level: Independent                  Ambulation/Gait Ambulation/Gait assistance: Independent Ambulation Distance (Feet): 340 Feet Assistive device: None Gait Pattern/deviations: Wide base of support Gait velocity: decreased   General Gait Details: steady with ambulation. ambulated on room air with saturations >93% thorughout activity HR from 92-116. DOE 2-3/4 but no significant limits in activity or function noted  Financial trader Rankin (Stroke Patients Only)       Balance Overall balance assessment: No apparent balance deficits (not formally assessed)                                           Pertinent Vitals/Pain Pain Assessment: No/denies pain    Home Living Family/patient expects to be discharged to:: Private residence Living Arrangements: Spouse/significant other Available Help at Discharge: Family Type of Home: House Home Access: Stairs to enter Entrance  Stairs-Rails: Can reach both Entrance Stairs-Number of Steps: 5 Home Layout: One level Home Equipment: Environmental consultant - 2 wheels      Prior Function Level of Independence: Independent               Hand Dominance   Dominant Hand: Right    Extremity/Trunk Assessment   Upper Extremity Assessment Upper Extremity Assessment: Overall WFL for tasks assessed    Lower Extremity Assessment Lower Extremity Assessment: Overall WFL for tasks assessed       Communication   Communication: HOH  Cognition Arousal/Alertness: Awake/alert Behavior During Therapy: WFL for tasks assessed/performed Overall Cognitive Status: Within Functional Limits for tasks assessed                      General Comments General comments (skin integrity, edema, etc.): educated patient on pursed lip breathing and energy conservation    Exercises     Assessment/Plan    PT Assessment Patent does not need any further PT services  PT Problem List         PT Treatment Interventions      PT Goals (Current goals can be found in the Care Plan section)  Acute Rehab PT Goals Patient Stated Goal: to go home PT Goal Formulation: All assessment and education complete, DC therapy    Frequency  Barriers to discharge        Co-evaluation               End of Session Equipment Utilized During Treatment: Gait belt Activity Tolerance: Patient tolerated treatment well Patient left: in bed;with call bell/phone within reach (sitting EOB) Nurse Communication: Mobility status PT Visit Diagnosis: Difficulty in walking, not elsewhere classified (R26.2)         Time: 9470-9628 PT Time Calculation (min) (ACUTE ONLY): 16 min   Charges:   PT Evaluation $PT Eval Low Complexity: 1 Procedure     PT G Codes:         Duncan Dull 2017-03-05, 2:38 PM Alben Deeds, [place] DPT  [phone]

## 2017-03-04 ENCOUNTER — Telehealth: Payer: Self-pay

## 2017-03-04 NOTE — Telephone Encounter (Signed)
TCM call made to patient. Left message for return call.  

## 2017-03-05 ENCOUNTER — Telehealth: Payer: Self-pay | Admitting: Family Medicine

## 2017-03-05 ENCOUNTER — Telehealth: Payer: Self-pay

## 2017-03-05 ENCOUNTER — Other Ambulatory Visit: Payer: Self-pay

## 2017-03-05 LAB — CULTURE, BLOOD (ROUTINE X 2)
CULTURE: NO GROWTH
CULTURE: NO GROWTH

## 2017-03-05 MED ORDER — ALBUTEROL SULFATE (5 MG/ML) 0.5% IN NEBU: 2.5000 mg | INHALATION_SOLUTION | RESPIRATORY_TRACT | 0 refills | Status: DC | PRN

## 2017-03-05 MED FILL — ALBUTEROL 0.083% INHAL SOLN: (2.5 MG/3ML | 15 days supply | Qty: 180 | Fill #0

## 2017-03-05 NOTE — Telephone Encounter (Signed)
Hospital follow up call completed.  

## 2017-03-05 NOTE — Telephone Encounter (Signed)
Spoke w/ Brayton Layman, verbal given.

## 2017-03-05 NOTE — Telephone Encounter (Signed)
Message  Received: Today  Message Contents  Ann Held, DO  [place], [place] to refill proventil with prefilled---see phone note

## 2017-03-05 NOTE — Telephone Encounter (Signed)
Thank you. Refilled patient notified.

## 2017-03-05 NOTE — Telephone Encounter (Signed)
Disp Refills Start End   albuterol (PROVENTIL) (5 MG/ML) 0.5% nebulizer solution        [facility] called to see if they could do pre mix instead of dropper they are out of stock,. Please advise [phone]  [facility]

## 2017-03-05 NOTE — Telephone Encounter (Signed)
Okay to refill one month supply. One neb 4 times a day prn wheezing

## 2017-03-05 NOTE — Telephone Encounter (Signed)
Patient will be in for Hospital follow up visit tomorrow with Dr. Carollee Herter. Requesting refill on Albuterol 2.5/30ml solution for Neb treatment. Patient was given this at Urgent Care for pneumonia. We do not have on his med list and it is not on discharge medication list. Patient states he needs refill and will discuss with Dr. Etter Sjogren tomorrow. Please advise. States he needs for wheezing.

## 2017-03-05 NOTE — Telephone Encounter (Signed)
Called patient for Hospital Follow Up Appointment. Unable to reach. Left messages x 2 days.

## 2017-03-05 NOTE — Telephone Encounter (Signed)
03/05/17  Hospital Follow Up Call  Transition Care Management Follow-up Telephone Call  ADMISSION DATE: 02/28/2017   DISCHARGE DATE: 113/11/2017     How have you been since you were released from the hospital? Patient states he has been short of breath off and on. Has raised area on R hand where IV was placed while in hospital.       Do you understand why you were in the hospital? YES   Do you understand the discharge instructions? Yes Items Reviewed:  Medications reviewed:  Medications reviewed with patient.  Allergies reviewed: Reviewed with patient.  Dietary changes reviewed:Regular per patient  Referrals reviewed: Pulmonary appointment scheduled. Has appointment scheduled with PCP   Functional Questionnaire:   Activities of Daily Living (ADLs):  No help needed at this time   Any transportation issues/concerns?: No   Any patient concerns?Patient concerned about stopping Celebrex. States this may affect his arthritis. Patient concerned about the raised area at IV site. Would like refills on Albuterol solution that he was given during an Urgent Care visit.Marland Kitchen   Confirmed importance and date/time of follow-up visits scheduled: Yes     Confirmed with patient if condition begins to worsen call PCP or go to the ER. Yes   Patient was given the [place] line [phone]: Yes

## 2017-03-05 NOTE — Telephone Encounter (Signed)
Please advise 

## 2017-03-06 ENCOUNTER — Other Ambulatory Visit (INDEPENDENT_AMBULATORY_CARE_PROVIDER_SITE_OTHER): Payer: PPO

## 2017-03-06 DIAGNOSIS — E785 Hyperlipidemia, unspecified: Secondary | ICD-10-CM | POA: Diagnosis not present

## 2017-03-06 DIAGNOSIS — I10 Essential (primary) hypertension: Secondary | ICD-10-CM

## 2017-03-06 DIAGNOSIS — E1151 Type 2 diabetes mellitus with diabetic peripheral angiopathy without gangrene: Secondary | ICD-10-CM

## 2017-03-06 DIAGNOSIS — E876 Hypokalemia: Secondary | ICD-10-CM

## 2017-03-06 LAB — COMPREHENSIVE METABOLIC PANEL
ALT: 64 U/L — AB (ref 0–53)
AST: 29 U/L (ref 0–37)
Albumin: 3.9 g/dL (ref 3.5–5.2)
Alkaline Phosphatase: 66 U/L (ref 39–117)
BILIRUBIN TOTAL: 0.4 mg/dL (ref 0.2–1.2)
BUN: 18 mg/dL (ref 6–23)
CALCIUM: 9.5 mg/dL (ref 8.4–10.5)
CHLORIDE: 110 meq/L (ref 96–112)
CO2: 20 meq/L (ref 19–32)
Creatinine, Ser: 1.18 mg/dL (ref 0.40–1.50)
GFR: 65.46 mL/min (ref >60.00)
GLUCOSE: 137 mg/dL — AB (ref 70–99)
Potassium: 3.7 mEq/L (ref 3.5–5.1)
Sodium: 141 mEq/L (ref 135–145)
Total Protein: 7.2 g/dL (ref 6.0–8.3)

## 2017-03-06 LAB — LIPID PANEL
CHOL/HDL RATIO: 4
CHOLESTEROL: 149 mg/dL (ref 0–200)
HDL: 36.1 mg/dL — ABNORMAL LOW (ref >39.00)
LDL CALC: 91 mg/dL (ref 0–99)
NonHDL: 112.59
TRIGLYCERIDES: 106 mg/dL (ref 0.0–149.0)
VLDL: 21.2 mg/dL (ref 0.0–40.0)

## 2017-03-06 LAB — HEMOGLOBIN A1C: Hgb A1c MFr Bld: 6.5 % (ref 4.6–6.5)

## 2017-03-06 LAB — POTASSIUM: Potassium: 3.7 mEq/L (ref 3.5–5.1)

## 2017-03-13 ENCOUNTER — Ambulatory Visit (HOSPITAL_BASED_OUTPATIENT_CLINIC_OR_DEPARTMENT_OTHER): Payer: PPO | Admitting: Hematology & Oncology

## 2017-03-13 ENCOUNTER — Encounter: Payer: Self-pay | Admitting: Family Medicine

## 2017-03-13 ENCOUNTER — Other Ambulatory Visit: Payer: Self-pay | Admitting: Family Medicine

## 2017-03-13 ENCOUNTER — Ambulatory Visit (INDEPENDENT_AMBULATORY_CARE_PROVIDER_SITE_OTHER): Payer: PPO | Admitting: Family Medicine

## 2017-03-13 ENCOUNTER — Other Ambulatory Visit (HOSPITAL_BASED_OUTPATIENT_CLINIC_OR_DEPARTMENT_OTHER): Payer: PPO

## 2017-03-13 ENCOUNTER — Encounter: Payer: Self-pay | Admitting: Hematology & Oncology

## 2017-03-13 ENCOUNTER — Ambulatory Visit: Payer: PPO

## 2017-03-13 VITALS — BP 112/73 | HR 70 | Temp 97.9°F | Resp 20 | Wt 226.8 lb

## 2017-03-13 VITALS — BP 118/62 | HR 80 | Temp 98.5°F | Resp 17 | Ht 69.0 in | Wt 230.8 lb

## 2017-03-13 DIAGNOSIS — I2699 Other pulmonary embolism without acute cor pulmonale: Secondary | ICD-10-CM | POA: Diagnosis not present

## 2017-03-13 DIAGNOSIS — I959 Hypotension, unspecified: Secondary | ICD-10-CM

## 2017-03-13 DIAGNOSIS — I10 Essential (primary) hypertension: Secondary | ICD-10-CM

## 2017-03-13 DIAGNOSIS — E1169 Type 2 diabetes mellitus with other specified complication: Secondary | ICD-10-CM | POA: Diagnosis not present

## 2017-03-13 DIAGNOSIS — D500 Iron deficiency anemia secondary to blood loss (chronic): Secondary | ICD-10-CM | POA: Diagnosis not present

## 2017-03-13 DIAGNOSIS — E1121 Type 2 diabetes mellitus with diabetic nephropathy: Secondary | ICD-10-CM

## 2017-03-13 DIAGNOSIS — E785 Hyperlipidemia, unspecified: Secondary | ICD-10-CM

## 2017-03-13 DIAGNOSIS — E876 Hypokalemia: Secondary | ICD-10-CM | POA: Diagnosis not present

## 2017-03-13 DIAGNOSIS — Z87891 Personal history of nicotine dependence: Secondary | ICD-10-CM | POA: Diagnosis not present

## 2017-03-13 DIAGNOSIS — Z801 Family history of malignant neoplasm of trachea, bronchus and lung: Secondary | ICD-10-CM

## 2017-03-13 DIAGNOSIS — G8929 Other chronic pain: Secondary | ICD-10-CM

## 2017-03-13 DIAGNOSIS — K219 Gastro-esophageal reflux disease without esophagitis: Secondary | ICD-10-CM

## 2017-03-13 DIAGNOSIS — Z8669 Personal history of other diseases of the nervous system and sense organs: Secondary | ICD-10-CM

## 2017-03-13 DIAGNOSIS — E119 Type 2 diabetes mellitus without complications: Secondary | ICD-10-CM

## 2017-03-13 DIAGNOSIS — R972 Elevated prostate specific antigen [PSA]: Secondary | ICD-10-CM | POA: Diagnosis not present

## 2017-03-13 DIAGNOSIS — A419 Sepsis, unspecified organism: Secondary | ICD-10-CM

## 2017-03-13 LAB — CBC WITH DIFFERENTIAL (CANCER CENTER ONLY)
BASO#: 0 10*3/uL (ref 0.0–0.2)
BASO%: 0.3 % (ref 0.0–2.0)
EOS%: 1.8 % (ref 0.0–7.0)
Eosinophils Absolute: 0.2 10*3/uL (ref 0.0–0.5)
HEMATOCRIT: 40.6 % (ref 38.7–49.9)
HGB: 13.7 g/dL (ref 13.0–17.1)
LYMPH#: 1.5 10*3/uL (ref 0.9–3.3)
LYMPH%: 16.1 % (ref 14.0–48.0)
MCH: 30.9 pg (ref 28.0–33.4)
MCHC: 33.7 g/dL (ref 32.0–35.9)
MCV: 92 fL (ref 82–98)
MONO#: 0.5 10*3/uL (ref 0.1–0.9)
MONO%: 5.4 % (ref 0.0–13.0)
NEUT#: 7.2 10*3/uL — ABNORMAL HIGH (ref 1.5–6.5)
NEUT%: 76.4 % (ref 40.0–80.0)
PLATELETS: 334 10*3/uL (ref 145–400)
RBC: 4.43 10*6/uL (ref 4.20–5.70)
RDW: 13.5 % (ref 11.1–15.7)
WBC: 9.4 10*3/uL (ref 4.0–10.0)

## 2017-03-13 LAB — FERRITIN: Ferritin: 97 ng/ml (ref 22–316)

## 2017-03-13 LAB — CHCC SATELLITE - SMEAR

## 2017-03-13 LAB — IRON AND TIBC
%SAT: 22 % (ref 20–55)
Iron: 78 ug/dL (ref 42–163)
TIBC: 355 ug/dL (ref 202–409)
UIBC: 277 ug/dL (ref 117–376)

## 2017-03-13 MED ORDER — METOPROLOL TARTRATE 25 MG PO TABS: 25.0000 mg | ORAL_TABLET | Freq: Every day | ORAL | 3 refills | Status: DC

## 2017-03-13 MED ORDER — PRAVASTATIN SODIUM 10 MG PO TABS: 10.0000 mg | ORAL_TABLET | Freq: Every day | ORAL | 3 refills | Status: DC

## 2017-03-13 MED ORDER — VERAPAMIL HCL ER 240 MG PO TBCR: 240.0000 mg | EXTENDED_RELEASE_TABLET | Freq: Every day | ORAL | 1 refills | Status: DC

## 2017-03-13 MED ORDER — ZOSTER VAC RECOMB ADJUVANTED 50 MCG/0.5ML IM SUSR: 50.0000 ug | Freq: Once | INTRAMUSCULAR | 1 refills | Status: AC

## 2017-03-13 MED ORDER — TERAZOSIN HCL 5 MG PO CAPS: ORAL_CAPSULE | ORAL | 0 refills | Status: DC

## 2017-03-13 MED ORDER — TOPIRAMATE 25 MG PO TABS: 25.0000 mg | ORAL_TABLET | Freq: Three times a day (TID) | ORAL | 3 refills | Status: DC

## 2017-03-13 MED ORDER — GABAPENTIN 300 MG PO CAPS: 300.0000 mg | ORAL_CAPSULE | Freq: Every day | ORAL | 1 refills | Status: DC

## 2017-03-13 MED ORDER — FUROSEMIDE 20 MG PO TABS: 20.0000 mg | ORAL_TABLET | Freq: Every day | ORAL | 3 refills | Status: DC

## 2017-03-13 MED ORDER — PANTOPRAZOLE SODIUM 40 MG PO TBEC
40.0000 mg | DELAYED_RELEASE_TABLET | Freq: Two times a day (BID) | ORAL | 3 refills | Status: DC
Start: 2017-03-13 — End: 2017-07-29

## 2017-03-13 MED ORDER — POTASSIUM CHLORIDE CRYS ER 20 MEQ PO TBCR: 40.0000 meq | EXTENDED_RELEASE_TABLET | Freq: Every day | ORAL | 1 refills | Status: DC

## 2017-03-13 MED ORDER — RIVAROXABAN 20 MG PO TABS
20.0000 mg | ORAL_TABLET | Freq: Every day | ORAL | 1 refills | Status: DC
Start: 2017-03-13 — End: 2017-08-26

## 2017-03-13 MED FILL — SHINGRIX VIAL KIT: 50 | 1 days supply | Qty: 1 | Fill #0

## 2017-03-13 NOTE — Assessment & Plan Note (Signed)
hgba1c acceptable, minimize simple carbs. Increase exercise as tolerated. Continue current meds 

## 2017-03-13 NOTE — Progress Notes (Signed)
30 minutes.

## 2017-03-13 NOTE — Assessment & Plan Note (Signed)
Tolerating statin, encouraged heart healthy diet, avoid trans fats, minimize simple carbs and saturated fats. Increase exercise as tolerated 

## 2017-03-13 NOTE — Assessment & Plan Note (Signed)
con't xaralto f/u hematology
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
## 2017-03-13 NOTE — Assessment & Plan Note (Signed)
Well controlled, no changes to meds. Encouraged heart healthy diet such as the DASH diet and exercise as tolerated.  °

## 2017-03-13 NOTE — Progress Notes (Signed)
Subjective:             HPI        Review of Systems        Objective:    Physical Exam        Assessment:             Plan:

## 2017-03-13 NOTE — Progress Notes (Signed)
Pre visit review using our clinic review tool, if applicable. No additional management support is needed unless otherwise documented below in the visit note. 
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
## 2017-03-13 NOTE — Patient Instructions (Signed)
Pulmonary Embolism °A pulmonary embolism (PE) is a sudden blockage or decrease of blood flow in one lung or both lungs. Most blockages come from a blood clot that travels from the legs or the pelvis to the lungs. PE is a dangerous and potentially life-threatening condition if it is not treated right away. °What are the causes? °A pulmonary embolism occurs most commonly when a blood clot travels from one of your veins to your lungs. Rarely, PE is caused by air, fat, amniotic fluid, or part of a tumor traveling through your veins to your lungs. °What increases the risk? °A PE is more likely to develop in: °· People who smoke. °· People who are older, especially over 60 years of age. °· People who are overweight (obese). °· People who sit or lie still for a long time, such as during long-distance travel (over 4 hours), bed rest, hospitalization, or during recovery from certain medical conditions like a stroke. °· People who do not engage in much physical activity (sedentary lifestyle). °· People who have chronic breathing disorders. °· People who have a personal or family history of blood clots or blood clotting disease. °· People who have peripheral vascular disease (PVD), diabetes, or some types of cancer. °· People who have heart disease, especially if the person had a recent heart attack or has congestive heart failure. °· People who have neurological diseases that affect the legs (leg paresis). °· People who have had a traumatic injury, such as breaking a hip or leg. °· People who have recently had major or lengthy surgery, especially on the hip, knee, or abdomen. °· People who have had a central line placed inside a large vein. °· People who take medicines that contain the hormone estrogen. These include birth control pills and hormone replacement therapy. °· Pregnancy or during childbirth or the postpartum period. ° °What are the signs or symptoms? °The symptoms of a PE usually start suddenly and  include: °· Shortness of breath while active or at rest. °· Coughing or coughing up blood or blood-tinged mucus. °· Chest pain that is often worse with deep breaths. °· Rapid or irregular heartbeat. °· Feeling light-headed or dizzy. °· Fainting. °· Feeling anxious. °· Sweating. ° °There may also be pain and swelling in a leg if that is where the blood clot started. °These symptoms may represent a serious problem that is an emergency. Do not wait to see if the symptoms will go away. Get medical help right away. Call your local emergency services (911 in the [place] Do not drive yourself to the hospital. °How is this diagnosed? °Your health care provider will take a medical history and perform a physical exam. You may also have other tests, including: °· Blood tests to assess the clotting properties of your blood, assess oxygen levels in your blood, and find blood clots. °· Imaging tests, such as CT, ultrasound, MRI, X-ray, and other tests to see if you have clots anywhere in your body. °· An electrocardiogram (ECG) to look for heart strain from blood clots in the lungs. ° °How is this treated? °The main goals of PE treatment are: °· To stop a blood clot from growing larger. °· To stop new blood clots from forming. ° °The type of treatment that you receive depends on many factors, such as the cause of your PE, your risk for bleeding or developing more clots, and other medical conditions that you have. Sometimes, a combination of treatments is necessary. °This condition may be treated with: °· Medicines, including newer oral blood thinners (  anticoagulants), warfarin, low molecular weight heparins, thrombolytics, or heparins. °· Wearing compression stockings or using different types of devices. °· Surgery (rare) to remove the blood clot or to place a filter in your abdomen to stop the blood clot from traveling to your lungs. ° °Treatments for a PE are often divided into immediate treatment, long-term treatment (up to 3  months after PE), and extended treatment (more than 3 months after PE). Your treatment may continue for several months. This is called maintenance therapy, and it is used to prevent the forming of new blood clots. You can work with your health care provider to choose the treatment program that is best for you. °What are anticoagulants? °Anticoagulants are medicines that treat PEs. They can stop current blood clots from growing and stop new clots from forming. They cannot dissolve existing clots. Your body dissolves clots by itself over time. Anticoagulants are given by mouth, by injection, or through an IV tube. °What are thrombolytics? °Thrombolytics are clot-dissolving medicines that are used to dissolve a PE. They carry a high risk of bleeding, so they tend to be used only in severe cases or if you have very low blood pressure. °Follow these instructions at home: °If you are taking a newer oral anticoagulant: °· Take the medicine every single day at the same time each day. °· Understand what foods and drugs interact with this medicine. °· Understand that there are no regular blood tests required when using this medicine. °· Understand the side effects of this medicine, including excessive bruising or bleeding. Ask your health care provider or pharmacist about other possible side effects. °If you are taking warfarin: °· Understand how to take warfarin and know which foods can affect how warfarin works in your body. °· Understand that it is dangerous to take too much or too little warfarin. Too much warfarin increases the risk of bleeding. Too little warfarin continues to allow the risk for blood clots. °· Follow your PT and INR blood testing schedule. The PT and INR results allow your health care provider to adjust your dose of warfarin. It is very important that you have your PT and INR tested as often as told by your health care provider. °· Avoid major changes in your diet, or tell your health care provider  before you change your diet. Arrange a visit with a registered dietitian to answer your questions. Many foods, especially foods that are high in vitamin K, can interfere with warfarin and affect the PT and INR results. Eat a consistent amount of foods that are high in vitamin K, such as: °? Spinach, kale, broccoli, cabbage, collard greens, turnip greens, Brussels sprouts, peas, cauliflower, seaweed, and parsley. °? Beef liver and pork liver. °? Green tea. °? Soybean oil. °· Tell your health care provider about any and all medicines, vitamins, and supplements that you take, including aspirin and other over-the-counter anti-inflammatory medicines. Be especially cautious with aspirin and anti-inflammatory medicines. Do not take those before you ask your health care provider if it is safe to do so. This is important because many medicines can interfere with warfarin and affect the PT and INR results. °· Do not start or stop taking any over-the-counter or prescription medicine unless your health care provider or pharmacist tells you to do so. °If you take warfarin, you will also need to do these things: °· Hold pressure over cuts for longer than usual. °· Tell your dentist and other health care providers that you are taking warfarin before you have   any procedures in which bleeding may occur. °· Avoid alcohol or drink very small amounts. Tell your health care provider if you change your alcohol intake. °· Do not use tobacco products, including cigarettes, chewing tobacco, and e-cigarettes. If you need help quitting, ask your health care provider. °· Avoid contact sports. ° °General instructions °· Take over-the-counter and prescription medicines only as told by your health care provider. Anticoagulant medicines can have side effects, including easy bruising and difficulty stopping bleeding. If you are prescribed an anticoagulant, you will also need to do these things: °? Hold pressure over cuts for longer than  usual. °? Tell your dentist and other health care providers that you are taking anticoagulants before you have any procedures in which bleeding may occur. °? Avoid contact sports. °· Wear a medical alert bracelet or carry a medical alert card that says you have had a PE. °· Ask your health care provider how soon you can go back to your normal activities. Stay active to prevent new blood clots from forming. °· Make sure to exercise while traveling or when you have been sitting or standing for a long period of time. It is very important to exercise. Exercise your legs by walking or by tightening and relaxing your leg muscles often. Take frequent walks. °· Wear compression stockings as told by your health care provider to help prevent more blood clots from forming. °· Do not use tobacco products, including cigarettes, chewing tobacco, and e-cigarettes. If you need help quitting, ask your health care provider. °· Keep all follow-up appointments with your health care provider. This is important. °How is this prevented? °Take these actions to decrease your risk of developing another PE: °· Exercise regularly. For at least 30 minutes every day, engage in: °? Activity that involves moving your arms and legs. °? Activity that encourages good blood flow through your body by increasing your heart rate. °· Exercise your arms and legs every hour during long-distance travel (over 4 hours). Drink plenty of water and avoid drinking alcohol while traveling. °· Avoid sitting or lying in bed for long periods of time without moving your legs. °· Maintain a weight that is appropriate for your height. Ask your health care provider what weight is healthy for you. °· If you are a woman who is over 35 years of age, avoid unnecessary use of medicines that contain estrogen. These include birth control pills. °· Do not smoke, especially if you take estrogen medicines. If you need help quitting, ask your health care provider. °· If you are at  very high risk for PE, wear compression stockings. °· If you recently had a PE, have regularly scheduled ultrasound testing on your legs to check for new blood clots. ° °If you are hospitalized, prevention measures may include: °· Early walking after surgery, as soon as your health care provider says that it is safe. °· Receiving anticoagulants to prevent blood clots. If you cannot take anticoagulants, other options may be available, such as wearing compression stockings or using different types of devices. ° °Get help right away if: °· You have new or increased pain, swelling, or redness in an arm or leg. °· You have numbness or tingling in an arm or leg. °· You have shortness of breath while active or at rest. °· You have chest pain. °· You have a rapid or irregular heartbeat. °· You feel light-headed or dizzy. °· You cough up blood. °· You notice blood in your vomit, bowel movement, or   urine. °· You have a fever. °These symptoms may represent a serious problem that is an emergency. Do not wait to see if the symptoms will go away. Get medical help right away. Call your local emergency services (911 in the [place] Do not drive yourself to the hospital. °This information is not intended to replace advice given to you by your health care provider. Make sure you discuss any questions you have with your health care provider. °Document Released: 12/06/2000 Document Revised: 05/16/2016 Document Reviewed: 04/05/2015 °[employer] Interactive Patient Education © 2017 [employer] ° °

## 2017-03-14 LAB — PROTEIN S, TOTAL: Protein S, Total: 85 % (ref 60–150)

## 2017-03-14 LAB — PROTEIN C ACTIVITY: PROTEIN C ACTIVITY: 116 % (ref 73–180)

## 2017-03-14 LAB — PROTEIN S ACTIVITY: Protein S-Functional: 140 % (ref 63–140)

## 2017-03-14 LAB — ANTITHROMBIN III: ANTITHROMBIN ACTIVITY: 181 % — AB (ref 75–135)

## 2017-03-15 LAB — BETA-2-GLYCOPROTEIN I ABS, IGG/M/A: Beta-2 Glyco 1 IgM: <9 GPI IgM units (ref 0–32)

## 2017-03-15 LAB — LUPUS ANTICOAGULANT PANEL
DRVVT MIX: 115.8 s — AB (ref 0.0–47.0)
Hexagonal Phase Phospholipid: 22 s — ABNORMAL HIGH (ref 0–11)
PTT-LA INCUB MIX: 60.2 s — AB (ref 0.0–48.9)
PTT-LA Mix: 46.6 s (ref 0.0–48.9)
PTT-LA: 63 s — ABNORMAL HIGH (ref 0.0–51.9)
dRVVT: >180 s — ABNORMAL HIGH (ref 0.0–47.0)

## 2017-03-17 ENCOUNTER — Ambulatory Visit (INDEPENDENT_AMBULATORY_CARE_PROVIDER_SITE_OTHER)
Admission: RE | Admit: 2017-03-17 | Discharge: 2017-03-17 | Disposition: A | Discharge status: Home / Self Care | Payer: PPO | Source: Ambulatory Visit | Attending: Acute Care | Admitting: Acute Care

## 2017-03-17 ENCOUNTER — Encounter: Payer: Self-pay | Admitting: Acute Care

## 2017-03-17 ENCOUNTER — Ambulatory Visit (INDEPENDENT_AMBULATORY_CARE_PROVIDER_SITE_OTHER): Payer: PPO | Admitting: Acute Care

## 2017-03-17 DIAGNOSIS — I2699 Other pulmonary embolism without acute cor pulmonale: Secondary | ICD-10-CM

## 2017-03-17 DIAGNOSIS — J181 Lobar pneumonia, unspecified organism: Secondary | ICD-10-CM

## 2017-03-17 DIAGNOSIS — Z09 Encounter for follow-up examination after completed treatment for conditions other than malignant neoplasm: Secondary | ICD-10-CM | POA: Diagnosis not present

## 2017-03-17 DIAGNOSIS — G4733 Obstructive sleep apnea (adult) (pediatric): Secondary | ICD-10-CM | POA: Diagnosis not present

## 2017-03-17 DIAGNOSIS — J189 Pneumonia, unspecified organism: Secondary | ICD-10-CM | POA: Diagnosis not present

## 2017-03-17 LAB — CARDIOLIPIN ANTIBODIES, IGG, IGM, IGA
Anticardiolipin Ab,IgA,Qn: <9 APL U/mL (ref 0–11)
Anticardiolipin Ab,IgG,Qn: <9 GPL U/mL (ref 0–14)
Anticardiolipin Ab,IgM,Qn: 11 MPL U/mL (ref 0–12)

## 2017-03-17 LAB — PROTEIN C, TOTAL: PROTEIN C ANTIGEN: 104 % (ref 60–150)

## 2017-03-17 MED FILL — METOPROLOL TARTRATE 25 MG T: 25 | 90 days supply | Qty: 90 | Fill #0
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
## 2017-03-17 NOTE — Assessment & Plan Note (Signed)
Compliant with Xarelto Plan: Continue taking Xarelto 20 mg daily. We have confirmed that you have plenty of Xarelto, please call us if you need any additional prescriptions. It is essential you do not miss a single dose of Xarelto. Fall and bleeding precautions, because your blood is thin. Hold pressure for longer to any bleeding.if it does not resolve, seek emergency care.  Follow up with Dr. Melvyn Novas In 3  Months. or before as needed.

## 2017-03-17 NOTE — Assessment & Plan Note (Signed)
Continue on CPAP at bedtime. You appear to be benefiting from the treatment Goal is to wear for at least 4-6 hours each night for maximal clinical benefit. Continue to work on weight loss, as the link between excess weight  and sleep apnea is well established.  Do not drive if sleepy. Follow up with Dr. Melvyn Novas In 3  Months. or before as needed.

## 2017-03-17 NOTE — Assessment & Plan Note (Addendum)
CAP with Sepsis>> Resolved Plan  CXR today.>> nterim near complete clearing of left base infiltrate. Interim clearing of left upper lung infiltrate . Continue using your Albuterol Nebs and rescue inhaler as needed for shortness of breath or wheezing. Reassess with Dr. Melvyn Novas need for maintenance inhaler therapy Follow up with Dr. Melvyn Novas In 3  Months. or before as needed.  Keep August Appointment ( This appointment is for lung nodules) Please contact office for sooner follow up if symptoms do not improve or worsen or seek emergency care

## 2017-03-17 NOTE — Patient Instructions (Addendum)
It is nice to meet you today. We will check a CXR today. We will call you with results. Continue using your Albuterol Nebs and rescue inhaler as needed for shortness of breath or wheezing. Continue on CPAP at bedtime. You appear to be benefiting from the treatment Goal is to wear for at least 4-6 hours each night for maximal clinical benefit. Continue to work on weight loss, as the link between excess weight  and sleep apnea is well established.  Do not drive if sleepy. Follow up with Dr. Melvyn Novas In 3  Months. or before as needed.  Keep August Appointment ( This appointment is for lung nodules) Continue taking Xarelto 20 mg daily. We have confirmed that you have plenty of Xarelto, please call us if you need any additional prescriptions. It is essential you do not miss a single dose of Xarelto. Fall and bleeding precautions, because your blood is thin. Hold pressure for longer to any bleeding.if it does not resolve, seek emergency care. Please contact office for sooner follow up if symptoms do not improve or worsen or seek emergency care

## 2017-03-18 LAB — PROTHROMBIN GENE MUTATION

## 2017-03-18 LAB — FACTOR 5 LEIDEN

## 2017-03-18 NOTE — Progress Notes (Signed)
Chart and office note reviewed in detail along with available xrays/ labs > agree with a/p as outlined  

## 2017-03-18 NOTE — Progress Notes (Signed)
Spoke with patient and informed him of results. Pt verbalized understanding and did not have any questions. Nothing further is needed.

## 2017-03-19 ENCOUNTER — Telehealth: Payer: Self-pay | Admitting: *Deleted

## 2017-03-19 NOTE — Telephone Encounter (Addendum)
Patient is aware of results  ----- Message from [name], MD sent at 03/19/2017  6:12 AM EDT ----- Call - so far all blood clotting tests are normal!! Garrett [name]

## 2017-03-21 ENCOUNTER — Encounter: Payer: Self-pay | Admitting: Family Medicine

## 2017-03-21 DIAGNOSIS — I2699 Other pulmonary embolism without acute cor pulmonale: Secondary | ICD-10-CM

## 2017-03-21 DIAGNOSIS — D500 Iron deficiency anemia secondary to blood loss (chronic): Secondary | ICD-10-CM

## 2017-03-21 DIAGNOSIS — M1000 Idiopathic gout, unspecified site: Secondary | ICD-10-CM

## 2017-03-21 MED FILL — TOPIRAMATE 25 MG TABLET: 25 | 90 days supply | Qty: 270 | Fill #3

## 2017-03-21 MED FILL — CELECOXIB 200 MG CAP: 200 | 90 days supply | Qty: 180 | Fill #3

## 2017-03-21 MED FILL — FUROSEMIDE 20 MG TABLET: 20 | 90 days supply | Qty: 90 | Fill #3

## 2017-03-21 MED FILL — TERAZOSIN 5 MG CAPSULE: 5 | 90 days supply | Qty: 90 | Fill #2

## 2017-03-21 MED FILL — PANTOPRAZOLE SOD DR 40 MG T: 40 | 90 days supply | Qty: 180 | Fill #3

## 2017-03-24 MED ORDER — ALLOPURINOL 100 MG PO TABS: 100.0000 mg | ORAL_TABLET | Freq: Every day | ORAL | 3 refills | Status: DC

## 2017-03-24 MED ORDER — METFORMIN HCL 500 MG PO TABS: 500.0000 mg | ORAL_TABLET | Freq: Every day | ORAL | 2 refills | Status: DC

## 2017-03-24 MED ORDER — MOMETASONE FUROATE 50 MCG/ACT NA SUSP: 2.0000 | Freq: Every day | NASAL | 2 refills | Status: DC

## 2017-03-24 MED FILL — ALLOPURINOL 100 MG TABLET: 100 | 90 days supply | Qty: 90 | Fill #0

## 2017-03-24 MED FILL — MOMETASONE FUROATE 50 MCG S: 50 | 90 days supply | Qty: 51 | Fill #0

## 2017-03-24 MED FILL — metFORMIN HCL 500 MG TABS: 500 | 90 days supply | Qty: 90 | Fill #0

## 2017-04-01 MED FILL — GABAPENTIN 300 MG CAPSULE: 300 | 90 days supply | Qty: 90 | Fill #1

## 2017-04-01 MED FILL — LEVOCETIRIZINE 5 MG TABLET: 5 | 30 days supply | Qty: 30 | Fill #0

## 2017-04-17 ENCOUNTER — Encounter: Payer: Self-pay | Admitting: Family Medicine

## 2017-04-17 ENCOUNTER — Other Ambulatory Visit: Payer: Self-pay | Admitting: Family Medicine

## 2017-04-17 DIAGNOSIS — R413 Other amnesia: Secondary | ICD-10-CM

## 2017-04-17 DIAGNOSIS — I10 Essential (primary) hypertension: Secondary | ICD-10-CM

## 2017-04-17 MED ORDER — MEMANTINE HCL 10 MG PO TABS
10.0000 mg | ORAL_TABLET | Freq: Two times a day (BID) | ORAL | 3 refills | Status: DC
Start: 2017-04-17 — End: 2018-04-13

## 2017-04-17 MED ORDER — METFORMIN HCL 500 MG PO TABS: 500.0000 mg | ORAL_TABLET | Freq: Two times a day (BID) | ORAL | 0 refills | Status: DC

## 2017-04-17 MED ORDER — VERAPAMIL HCL ER 240 MG PO TBCR: 240.0000 mg | EXTENDED_RELEASE_TABLET | Freq: Every day | ORAL | 1 refills | Status: DC

## 2017-04-17 MED FILL — VERAPAMIL ER 240 MG TABLET: 240 | 90 days supply | Qty: 90 | Fill #0

## 2017-04-17 MED FILL — POTASSIUM CL ER 20 MEQ TAB: 20 | 45 days supply | Qty: 90 | Fill #1

## 2017-04-17 MED FILL — MEMANTINE HCL 10 MG TABLET: 10 | 90 days supply | Qty: 180 | Fill #0

## 2017-04-17 NOTE — Telephone Encounter (Signed)
Needs [employer] metformin

## 2017-04-17 NOTE — Telephone Encounter (Signed)
Needs [employer] metformin to 1000mg daily

## 2017-04-18 MED FILL — ONE TOUCH ULTRA TEST STRIPS: 30 days supply | Qty: 100 | Fill #1

## 2017-04-24 ENCOUNTER — Ambulatory Visit (INDEPENDENT_AMBULATORY_CARE_PROVIDER_SITE_OTHER): Payer: PPO | Admitting: Family Medicine

## 2017-04-24 ENCOUNTER — Encounter: Payer: Self-pay | Admitting: Family Medicine

## 2017-04-24 VITALS — BP 102/51 | HR 76 | Temp 98.3°F | Resp 16 | Ht 69.0 in | Wt 228.6 lb

## 2017-04-24 DIAGNOSIS — E785 Hyperlipidemia, unspecified: Secondary | ICD-10-CM | POA: Diagnosis not present

## 2017-04-24 DIAGNOSIS — E1121 Type 2 diabetes mellitus with diabetic nephropathy: Secondary | ICD-10-CM

## 2017-04-24 DIAGNOSIS — E1169 Type 2 diabetes mellitus with other specified complication: Secondary | ICD-10-CM

## 2017-04-24 DIAGNOSIS — M5440 Lumbago with sciatica, unspecified side: Secondary | ICD-10-CM | POA: Diagnosis not present

## 2017-04-24 DIAGNOSIS — M109 Gout, unspecified: Secondary | ICD-10-CM | POA: Diagnosis not present

## 2017-04-24 DIAGNOSIS — I10 Essential (primary) hypertension: Secondary | ICD-10-CM

## 2017-04-24 LAB — COMPREHENSIVE METABOLIC PANEL
ALBUMIN: 4.2 g/dL (ref 3.5–5.2)
ALT: 40 U/L (ref 0–53)
AST: 26 U/L (ref 0–37)
Alkaline Phosphatase: 72 U/L (ref 39–117)
BUN: 18 mg/dL (ref 6–23)
CALCIUM: 9.4 mg/dL (ref 8.4–10.5)
CHLORIDE: 110 meq/L (ref 96–112)
CO2: 23 meq/L (ref 19–32)
CREATININE: 1.16 mg/dL (ref 0.40–1.50)
GFR: 66.74 mL/min (ref >60.00)
Glucose, Bld: 115 mg/dL — ABNORMAL HIGH (ref 70–99)
POTASSIUM: 3.9 meq/L (ref 3.5–5.1)
Sodium: 140 mEq/L (ref 135–145)
Total Bilirubin: 0.3 mg/dL (ref 0.2–1.2)
Total Protein: 7 g/dL (ref 6.0–8.3)

## 2017-04-24 LAB — LIPID PANEL
CHOLESTEROL: 148 mg/dL (ref 0–200)
HDL: 42.4 mg/dL (ref >39.00)
LDL CALC: 78 mg/dL (ref 0–99)
NonHDL: 105.46
TRIGLYCERIDES: 139 mg/dL (ref 0.0–149.0)
Total CHOL/HDL Ratio: 3
VLDL: 27.8 mg/dL (ref 0.0–40.0)

## 2017-04-24 LAB — HEMOGLOBIN A1C: Hgb A1c MFr Bld: 6.1 % (ref 4.6–6.5)

## 2017-04-24 LAB — URIC ACID: Uric Acid, Serum: 6.2 mg/dL (ref 4.0–7.8)
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
## 2017-04-24 NOTE — Progress Notes (Signed)
Pre visit review using our clinic review tool, if applicable. No additional management support is needed unless otherwise documented below in the visit note. 

## 2017-04-24 NOTE — Assessment & Plan Note (Signed)
Well controlled, no changes to meds. Encouraged heart healthy diet such as the DASH diet and exercise as tolerated.  °

## 2017-04-24 NOTE — Assessment & Plan Note (Signed)
Tolerating statin, encouraged heart healthy diet, avoid trans fats, minimize simple carbs and saturated fats. Increase exercise as tolerated 

## 2017-04-24 NOTE — Assessment & Plan Note (Signed)
Sugars have been running high con't meds Check labs

## 2017-04-24 NOTE — Patient Instructions (Signed)
Carbohydrate Counting for Diabetes Mellitus, Adult Carbohydrate counting is a method for keeping track of how many carbohydrates you eat. Eating carbohydrates naturally increases the amount of sugar (glucose) in the blood. Counting how many carbohydrates you eat helps keep your blood glucose within normal limits, which helps you manage your diabetes (diabetes mellitus). It is important to know how many carbohydrates you can safely have in each meal. This is different for every person. A diet and nutrition specialist (registered dietitian) can help you make a meal plan and calculate how many carbohydrates you should have at each meal and snack. Carbohydrates are found in the following foods:  Grains, such as breads and cereals.  Dried beans and soy products.  Starchy vegetables, such as potatoes, peas, and corn.  Fruit and fruit juices.  Milk and yogurt.  Sweets and snack foods, such as cake, cookies, candy, chips, and soft drinks. How do I count carbohydrates? There are two ways to count carbohydrates in food. You can use either of the methods or a combination of both. Reading "Nutrition Facts" on packaged food  The "Nutrition Facts" list is included on the labels of almost all packaged foods and beverages in the [place] It includes:  The serving size.  Information about nutrients in each serving, including the grams (g) of carbohydrate per serving. To use the "Nutrition Facts":  Decide how many servings you will have.  Multiply the number of servings by the number of carbohydrates per serving.  The resulting number is the total amount of carbohydrates that you will be having. Learning standard serving sizes of other foods  When you eat foods containing carbohydrates that are not packaged or do not include "Nutrition Facts" on the label, you need to measure the servings in order to count the amount of carbohydrates:  Measure the foods that you will eat with a food scale or measuring  cup, if needed.  Decide how many standard-size servings you will eat.  Multiply the number of servings by 15. Most carbohydrate-rich foods have about 15 g of carbohydrates per serving.  For example, if you eat 8 oz (170 g) of strawberries, you will have eaten 2 servings and 30 g of carbohydrates (2 servings x 15 g = 30 g).  For foods that have more than one food mixed, such as soups and casseroles, you must count the carbohydrates in each food that is included. The following list contains standard serving sizes of common carbohydrate-rich foods. Each of these servings has about 15 g of carbohydrates:   hamburger bun or  English muffin.   oz (15 mL) syrup.   oz (14 g) jelly.  1 slice of bread.  1 six-inch tortilla.  3 oz (85 g) cooked rice or pasta.  4 oz (113 g) cooked dried beans.  4 oz (113 g) starchy vegetable, such as peas, corn, or potatoes.  4 oz (113 g) hot cereal.  4 oz (113 g) mashed potatoes or  of a large baked potato.  4 oz (113 g) canned or frozen fruit.  4 oz (120 mL) fruit juice.  4-6 crackers.  6 chicken nuggets.  6 oz (170 g) unsweetened dry cereal.  6 oz (170 g) plain fat-free yogurt or yogurt sweetened with artificial sweeteners.  8 oz (240 mL) milk.  8 oz (170 g) fresh fruit or one small piece of fruit.  24 oz (680 g) popped popcorn. Example of carbohydrate counting Sample meal  3 oz (85 g) chicken breast.  6 oz (  170 g) brown rice.  4 oz (113 g) corn.  8 oz (240 mL) milk.  8 oz (170 g) strawberries with sugar-free whipped topping. Carbohydrate calculation 1. Identify the foods that contain carbohydrates:  Rice.  Corn.  Milk.  Strawberries. 2. Calculate how many servings you have of each food:  2 servings rice.  1 serving corn.  1 serving milk.  1 serving strawberries. 3. Multiply each number of servings by 15 g:  2 servings rice x 15 g = 30 g.  1 serving corn x 15 g = 15 g.  1 serving milk x 15 g = 15  g.  1 serving strawberries x 15 g = 15 g. 4. Add together all of the amounts to find the total grams of carbohydrates eaten:  30 g + 15 g + 15 g + 15 g = 75 g of carbohydrates total. This information is not intended to replace advice given to you by your health care provider. Make sure you discuss any questions you have with your health care provider. Document Released: 12/09/2005 Document Revised: 06/28/2016 Document Reviewed: 05/22/2016 Elsevier Interactive Patient Education  2017 [employer] Inc.  

## 2017-04-28 MED FILL — LEVOCETIRIZINE 5 MG TABLET: 5 | 30 days supply | Qty: 30 | Fill #1

## 2017-04-28 NOTE — Addendum Note (Signed)
Addended by: Caffie Pinto on: 04/28/2017 09:39 AM   Modules accepted: Orders

## 2017-04-30 ENCOUNTER — Encounter: Payer: Self-pay | Admitting: Family Medicine

## 2017-05-02 NOTE — Telephone Encounter (Signed)
The problem is that would be a total of 2 different MRI s to get authorization for -- 1 for each hip and then low back.

## 2017-05-02 NOTE — Telephone Encounter (Signed)
The problem is that would be 3 separate mris --- 1 for each hip and low back.   On my exam it and according to the hx he gave , I have doc for back mri but not hip---  Ins most likely would not cover hip--- maybe ortho could

## 2017-05-08 ENCOUNTER — Ambulatory Visit: Payer: PPO | Admitting: Family Medicine

## 2017-05-08 DIAGNOSIS — H5213 Myopia, bilateral: Secondary | ICD-10-CM | POA: Diagnosis not present

## 2017-05-08 DIAGNOSIS — H26493 Other secondary cataract, bilateral: Secondary | ICD-10-CM | POA: Diagnosis not present

## 2017-05-08 DIAGNOSIS — H5022 Vertical strabismus, left eye: Secondary | ICD-10-CM | POA: Diagnosis not present

## 2017-05-08 DIAGNOSIS — H52223 Regular astigmatism, bilateral: Secondary | ICD-10-CM | POA: Diagnosis not present

## 2017-05-08 DIAGNOSIS — H40013 Open angle with borderline findings, low risk, bilateral: Secondary | ICD-10-CM | POA: Diagnosis not present

## 2017-05-08 DIAGNOSIS — E119 Type 2 diabetes mellitus without complications: Secondary | ICD-10-CM | POA: Diagnosis not present

## 2017-05-08 DIAGNOSIS — H524 Presbyopia: Secondary | ICD-10-CM | POA: Diagnosis not present

## 2017-05-08 LAB — HM DIABETES EYE EXAM

## 2017-05-09 DIAGNOSIS — M47817 Spondylosis without myelopathy or radiculopathy, lumbosacral region: Secondary | ICD-10-CM | POA: Diagnosis not present

## 2017-05-13 ENCOUNTER — Ambulatory Visit (HOSPITAL_BASED_OUTPATIENT_CLINIC_OR_DEPARTMENT_OTHER)
Admission: RE | Admit: 2017-05-13 | Discharge: 2017-05-13 | Disposition: A | Discharge status: Home / Self Care | Payer: PPO | Source: Ambulatory Visit | Attending: Hematology & Oncology | Admitting: Hematology & Oncology

## 2017-05-13 ENCOUNTER — Ambulatory Visit: Payer: PPO | Admitting: Hematology & Oncology

## 2017-05-13 ENCOUNTER — Other Ambulatory Visit (HOSPITAL_BASED_OUTPATIENT_CLINIC_OR_DEPARTMENT_OTHER): Payer: PPO

## 2017-05-13 ENCOUNTER — Encounter (HOSPITAL_BASED_OUTPATIENT_CLINIC_OR_DEPARTMENT_OTHER): Payer: Self-pay

## 2017-05-13 ENCOUNTER — Ambulatory Visit (HOSPITAL_BASED_OUTPATIENT_CLINIC_OR_DEPARTMENT_OTHER): Payer: PPO | Admitting: Hematology & Oncology

## 2017-05-13 VITALS — BP 128/75 | HR 70 | Temp 98.0°F | Resp 19 | Wt 228.4 lb

## 2017-05-13 DIAGNOSIS — R911 Solitary pulmonary nodule: Secondary | ICD-10-CM

## 2017-05-13 DIAGNOSIS — D500 Iron deficiency anemia secondary to blood loss (chronic): Secondary | ICD-10-CM

## 2017-05-13 DIAGNOSIS — I2699 Other pulmonary embolism without acute cor pulmonale: Secondary | ICD-10-CM

## 2017-05-13 DIAGNOSIS — K449 Diaphragmatic hernia without obstruction or gangrene: Secondary | ICD-10-CM | POA: Diagnosis not present

## 2017-05-13 DIAGNOSIS — R918 Other nonspecific abnormal finding of lung field: Secondary | ICD-10-CM | POA: Insufficient documentation

## 2017-05-13 LAB — CBC WITH DIFFERENTIAL (CANCER CENTER ONLY)
BASO#: 0 10*3/uL (ref 0.0–0.2)
BASO%: 0.2 % (ref 0.0–2.0)
EOS ABS: 0.1 10*3/uL (ref 0.0–0.5)
EOS%: 1.2 % (ref 0.0–7.0)
HCT: 41.8 % (ref 38.7–49.9)
HGB: 14 g/dL (ref 13.0–17.1)
LYMPH#: 1.9 10*3/uL (ref 0.9–3.3)
LYMPH%: 23.2 % (ref 14.0–48.0)
MCH: 30.9 pg (ref 28.0–33.4)
MCHC: 33.5 g/dL (ref 32.0–35.9)
MCV: 92 fL (ref 82–98)
MONO#: 0.6 10*3/uL (ref 0.1–0.9)
MONO%: 7.6 % (ref 0.0–13.0)
NEUT#: 5.5 10*3/uL (ref 1.5–6.5)
NEUT%: 67.8 % (ref 40.0–80.0)
Platelets: 219 10*3/uL (ref 145–400)
RBC: 4.53 10*6/uL (ref 4.20–5.70)
RDW: 13.8 % (ref 11.1–15.7)
WBC: 8.1 10*3/uL (ref 4.0–10.0)

## 2017-05-13 LAB — CMP (CANCER CENTER ONLY)
ALK PHOS: 92 U/L — AB (ref 26–84)
ALT: 46 U/L (ref 10–47)
AST: 33 U/L (ref 11–38)
Albumin: 3.8 g/dL (ref 3.3–5.5)
BUN: 14 mg/dL (ref 7–22)
CALCIUM: 9.4 mg/dL (ref 8.0–10.3)
CHLORIDE: 108 meq/L (ref 98–108)
CO2: 25 mEq/L (ref 18–33)
Creat: 1.3 mg/dl — ABNORMAL HIGH (ref 0.6–1.2)
GLUCOSE: 103 mg/dL (ref 73–118)
POTASSIUM: 3.9 meq/L (ref 3.3–4.7)
Sodium: 141 mEq/L (ref 128–145)
Total Bilirubin: 0.6 mg/dl (ref 0.20–1.60)
Total Protein: 7.4 g/dL (ref 6.4–8.1)

## 2017-05-13 MED ORDER — IOPAMIDOL (ISOVUE-370) INJECTION 76%
100.0000 mL | Freq: Once | INTRAVENOUS | Status: AC | PRN
  Administered 2017-05-13: 100 mL via INTRAVENOUS

## 2017-05-13 NOTE — Progress Notes (Addendum)
Hematology and Oncology Follow Up Visit  LENARDO WESTWOOD [id number] 1951-06-22 66 y.o. 05/13/2017   Principle Diagnosis:   Bilateral pulmonary emboli-no cor pulmonale  Lupus Anticoagulant (+)  Current Therapy:    Xarelto 20 mg by mouth daily-to complete 1 year in February 2019  EC ASA 81 mg po q day     Interim History:  Mr. Camino is back for follow-up. This is his second office visit. We saw him back in March. He is doing well. 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  He is doing well on Xarelto. He's had no problems with bleeding or bruising.  We did go ahead and repeat a CT angiogram on him. This did not show any evidence of pulmonary emboli. He does have stable pulmonary nodules. I suspect the nodules probably are environmentally related given his type of work.  We did do a hypercoagulable panel on him. It did show, not surprising, there is a lupus anticoagulant area and I'm not sure if this is truly legitimate. We will have to follow-up with this. Otherwise, his hypercoagulable studies all appeared normal.  He has had no cough. There is no chest wall pain. Her graph he's had no change in bowel or bladder habits.  He's had no leg swelling.  Overall, his performance status is ECOG 1.  Medications:  Current Outpatient Prescriptions:  .  acidophilus (RISAQUAD) CAPS capsule, Take 1 capsule by mouth daily., Disp: , Rfl:  .  albuterol (PROVENTIL) (5 MG/ML) 0.5% nebulizer solution, Take 0.5 mLs (2.5 mg total) by nebulization every 4 (four) hours as needed for wheezing or shortness of breath., Disp: 20 mL, Rfl: 0 .  allopurinol (ZYLOPRIM) 100 MG tablet, Take 1 tablet (100 mg total) by mouth daily., Disp: 90 tablet, Rfl: 3 .  Blood Glucose Monitoring Suppl (ONE TOUCH ULTRA 2) w/Device KIT, , Disp: , Rfl:  .  celecoxib (CELEBREX) 200 MG capsule, , Disp: , Rfl:  .  cyanocobalamin 500  MCG tablet, Take 500 mcg by mouth daily.  , Disp: , Rfl:  .  EPINEPHrine (EPIPEN) 0.3 mg/0.3 mL SOAJ injection, Inject 0.3 mg into the muscle once., Disp: , Rfl:  .  furosemide (LASIX) 20 MG tablet, Take 1 tablet (20 mg total) by mouth daily., Disp: 90 tablet, Rfl: 3 .  gabapentin (NEURONTIN) 300 MG capsule, Take 1 capsule (300 mg total) by mouth at bedtime., Disp: 90 capsule, Rfl: 1 .  ipratropium (ATROVENT HFA) 17 MCG/ACT inhaler, Inhale 2 puffs into the lungs every 6 (six) hours., Disp: , Rfl:  .  levocetirizine (XYZAL) 5 MG tablet, Take 1 tablet (5 mg total) by mouth every evening., Disp: 90 tablet, Rfl: 1 .  magnesium oxide (MAG-OX) 400 (241.3 Mg) MG tablet, Take 1 tablet (400 mg total) by mouth daily., Disp: 30 tablet, Rfl: 1 .  memantine (NAMENDA) 10 MG tablet, Take 1 tablet (10 mg total) by mouth 2 (two) times daily., Disp: 180 tablet, Rfl: 3 .  metFORMIN (GLUCOPHAGE) 500 MG tablet, Take 1 tablet (500 mg total) by mouth 2 (two) times daily with a meal., Disp: 180 tablet, Rfl: 0 .  metoprolol tartrate (LOPRESSOR) 25 MG tablet, Take 1 tablet (25 mg total) by mouth daily., Disp: 90 tablet, Rfl: 3 .  mometasone (NASONEX) 50 MCG/ACT nasal spray, Place 2 sprays into the nose daily., Disp: 51 g, Rfl: 2 .  mometasone-formoterol (DULERA)  200-5 MCG/ACT AERO, Inhale 2 puffs into the lungs 2 (two) times daily., Disp: 1 Inhaler, Rfl: 11 .  Multiple Vitamins-Minerals (ONE-A-DAY WEIGHT SMART ADVANCE PO), Take 1 tablet by mouth daily. , Disp: , Rfl:  .  ONE TOUCH ULTRA TEST test strip, , Disp: , Rfl:  .  ONETOUCH DELICA LANCETS 32R MISC, , Disp: , Rfl:  .  pantoprazole (PROTONIX) 40 MG tablet, Take 1 tablet (40 mg total) by mouth 2 (two) times daily., Disp: 180 tablet, Rfl: 3 .  potassium chloride SA (K-DUR,KLOR-CON) 20 MEQ tablet, Take 2 tablets (40 mEq total) by mouth daily., Disp: 90 tablet, Rfl: 1 .  pravastatin (PRAVACHOL) 10 MG tablet, Take 1 tablet (10 mg total) by mouth daily., Disp: 90 tablet, Rfl:  3 .  PROAIR HFA 108 (90 Base) MCG/ACT inhaler, , Disp: , Rfl:  .  rivaroxaban (XARELTO) 20 MG TABS tablet, Take 1 tablet (20 mg total) by mouth daily., Disp: 90 tablet, Rfl: 1 .  terazosin (HYTRIN) 5 MG capsule, TAKE 1 CAPSULE BY MOUTH ONCE AT BEDTIME, Disp: 30 capsule, Rfl: 0 .  topiramate (TOPAMAX) 25 MG tablet, Take 1 tablet (25 mg total) by mouth 3 (three) times daily., Disp: 270 tablet, Rfl: 3 .  verapamil (CALAN-SR) 240 MG CR tablet, Take 1 tablet (240 mg total) by mouth at bedtime., Disp: 90 tablet, Rfl: 1  Allergies:  Allergies  Allergen Reactions  . Bee Venom Anaphylaxis  . Garlic Swelling    Past Medical History, Surgical history, Social history, and Family History were reviewed and updated.  Review of Systems: As above  Physical Exam:  weight is 228 lb 6.4 oz (103.6 kg). His oral temperature is 98 F (36.7 C). His blood pressure is 128/75 and his pulse is 70. His respiration is 19 and oxygen saturation is 97%.   Wt Readings from Last 3 Encounters:  05/13/17 228 lb 6.4 oz (103.6 kg)  04/24/17 228 lb 9.6 oz (103.7 kg)  03/17/17 228 lb 3.2 oz (103.5 kg)     Head neck exam shows no ocular or oral lesions. There are no palpable cervical or supraclavicular lymph nodes. Lungs are clear bilaterally. Cardiac exam regular rate and rhythm with no murmurs, rubs or bruits. Abdomen is soft and obese. He has good bowel sounds. There is no fluid wave. There is no palpable hepatosplenomegaly. Back exam shows no tenderness over the spine, ribs or hips. Extremities shows no clubbing, cyanosis or edema. He has good range of motion of his joints. No venous cord is noted in his legs. Skin exam shows no rashes, ecchymoses or petechia. Neurological exam does show some slight cognitive impairment with his memory.  Lab Results  Component Value Date   WBC 8.1 05/13/2017   HGB 14.0 05/13/2017   HCT 41.8 05/13/2017   MCV 92 05/13/2017   PLT 219 05/13/2017     Chemistry      Component Value  Date/Time   NA 141 05/13/2017 1001   K 3.9 05/13/2017 1001   CL 108 05/13/2017 1001   CO2 25 05/13/2017 1001   BUN 14 05/13/2017 1001   CREATININE 1.3 (H) 05/13/2017 1001      Component Value Date/Time   CALCIUM 9.4 05/13/2017 1001   ALKPHOS 92 (H) 05/13/2017 1001   AST 33 05/13/2017 1001   ALT 46 05/13/2017 1001   BILITOT 0.60 05/13/2017 1001         Impression and Plan: Mr. Susman is a 66 year old gentleman with a recent diagnosis  of bilateral pulmonary emboli. He is on Xarelto. This was diagnosed back in February. He needs 1 year of anticoagulation from my point of view.  I'm not sure if this lupus anticoagulant is real. I will have to repeat this when we get him back in 6 months.  For now, I will just keep him on Xarelto. I will not add aspirin.  I'm just grateful and he is done so well.   [name], MD 5/22/201812:31 PM   ADDENDUM:  His repeat lupus anticoagulant test is positive. As such, I do believe that this is a clinically significant test.  I spoke to him. I told him that we need to add baby aspirin (81 mg) to the Xarelto. He will stay on this until the Xarelto finishes up and then he will go on to full dose aspirin. I told him to make sure that he takes coated aspirin and that he takes it with food. He understands this.  Lattie Haw, MD

## 2017-05-14 ENCOUNTER — Encounter: Payer: Self-pay | Admitting: Family Medicine

## 2017-05-14 ENCOUNTER — Encounter: Payer: Self-pay | Admitting: *Deleted

## 2017-05-14 LAB — D-DIMER, QUANTITATIVE (NOT AT ARMC): D-DIMER: 0.47 mg{FEU}/L (ref 0.00–0.49)

## 2017-05-15 LAB — LUPUS ANTICOAGULANT PANEL
DRVVT CONFIRM: 2.2 ratio — AB (ref 0.8–1.2)
DRVVT MIX: 95.2 s — AB (ref 0.0–47.0)
DRVVT: 163.2 s — AB (ref 0.0–47.0)
Hexagonal Phase Phospholipid: 33 s — ABNORMAL HIGH (ref 0–11)
PTT-LA Mix: 49 s — ABNORMAL HIGH (ref 0.0–48.9)
PTT-LA: 56.4 s — ABNORMAL HIGH (ref 0.0–51.9)

## 2017-05-15 MED ORDER — METFORMIN HCL 500 MG PO TABS: 500.0000 mg | ORAL_TABLET | Freq: Two times a day (BID) | ORAL | 0 refills | Status: DC

## 2017-05-15 MED FILL — metFORMIN HCL 500 MG TABS: 500 | 90 days supply | Qty: 180 | Fill #0

## 2017-05-21 ENCOUNTER — Encounter: Payer: Self-pay | Admitting: Hematology & Oncology

## 2017-05-25 ENCOUNTER — Encounter: Payer: Self-pay | Admitting: Family Medicine

## 2017-05-25 DIAGNOSIS — E876 Hypokalemia: Secondary | ICD-10-CM

## 2017-05-25 MED FILL — PRAVASTATIN NA 10 MG TAB: 10 | 90 days supply | Qty: 90 | Fill #2

## 2017-05-25 MED FILL — LEVOCETIRIZINE 5 MG TABLET: 5 | 30 days supply | Qty: 30 | Fill #2

## 2017-05-25 MED FILL — XARELTO 20 MG TABLET: 20 | 90 days supply | Qty: 90 | Fill #1

## 2017-05-26 ENCOUNTER — Other Ambulatory Visit: Payer: Self-pay | Admitting: Medical

## 2017-05-26 MED ORDER — POTASSIUM CHLORIDE CRYS ER 20 MEQ PO TBCR: 40.0000 meq | EXTENDED_RELEASE_TABLET | Freq: Every day | ORAL | 1 refills | Status: DC

## 2017-05-26 MED FILL — POTASSIUM CL ER 20 MEQ TAB: 20 | 45 days supply | Qty: 90 | Fill #0

## 2017-06-03 MED FILL — ATROVENT HFA INHALER: 17 | 25 days supply | Qty: 13 | Fill #0

## 2017-06-03 NOTE — Telephone Encounter (Signed)
Pt requesting ipratropium last rx 03/06/16

## 2017-06-09 MED FILL — METOPROLOL TARTRATE 25 MG T: 25 | 90 days supply | Qty: 90 | Fill #1

## 2017-06-17 ENCOUNTER — Ambulatory Visit (INDEPENDENT_AMBULATORY_CARE_PROVIDER_SITE_OTHER): Payer: PPO | Admitting: Internal Medicine

## 2017-06-17 ENCOUNTER — Encounter: Payer: Self-pay | Admitting: Internal Medicine

## 2017-06-17 VITALS — BP 118/76 | HR 73 | Ht 69.0 in | Wt 231.0 lb

## 2017-06-17 DIAGNOSIS — J45991 Cough variant asthma: Secondary | ICD-10-CM | POA: Diagnosis not present

## 2017-06-17 MED ORDER — BUDESONIDE-FORMOTEROL FUMARATE 80-4.5 MCG/ACT IN AERO: 2.0000 | INHALATION_SPRAY | Freq: Two times a day (BID) | RESPIRATORY_TRACT | 11 refills | Status: DC

## 2017-06-17 MED ORDER — BUDESONIDE-FORMOTEROL FUMARATE 80-4.5 MCG/ACT IN AERO: 2.0000 | INHALATION_SPRAY | Freq: Two times a day (BID) | RESPIRATORY_TRACT | 0 refills | Status: DC

## 2017-06-17 NOTE — Patient Instructions (Signed)
Plan A = Automatic = symbicort 80 Take 2 puffs first thing in am and then another 2 puffs about 12 hours later.   Work on inhaler technique:  relax and gently blow all the way out then take a nice smooth deep breath back in, triggering the inhaler at same time you start breathing in.  Hold for up to 5 seconds if you can. Blow out thru nose. Rinse and gargle with water when done   Plan B = Backup Only use your albuterol (PROAIR)as a rescue medication to be used if you can't catch your breath by resting or doing a relaxed purse lip breathing pattern.  - The less you use it, the better it will work when you need it. - Ok to use the inhaler up to 2 puffs  every 4 hours if you must but call for appointment if use goes up over your usual need - Don't leave home without it !!  (think of it like the spare tire for your car)   Plan C = Crisis - only use your albuterol nebulizer if you first try Plan B and it fails to help > ok to use the nebulizer up to every 4 hours but if start needing it regularly call for immediate appointment   Continue protonix (pantoprazole) 40 mg Take 30- 60 min before your first and last meals of the day and add Pepcid ac 20 mg at bedtime   GERD (REFLUX)  is an extremely common cause of respiratory symptoms just like yours , many times with no obvious heartburn at all.    It can be treated with medication, but also with lifestyle changes including elevation of the head of your bed (ideally with 6 inch  bed blocks),  Smoking cessation, avoidance of late meals, excessive alcohol, and avoid fatty foods, chocolate, peppermint, colas, red wine, and acidic juices such as orange juice.  NO MINT OR MENTHOL PRODUCTS SO NO COUGH DROPS   USE SUGARLESS CANDY INSTEAD (Jolley ranchers or Stover's or Life Savers) or even ice chips will also do - the key is to swallow to prevent all throat clearing. NO OIL BASED VITAMINS - use powdered substitutes.    See Tammy NP w/in 2 weeks (or first  available)  with all your medications, even over the counter meds, separated in two separate bags, the ones you take no matter what vs the ones you stop once you feel better and take only as needed when you feel you need them.   Tammy  will generate for you a new user friendly medication calendar that will put [place] all on the same page re: your medication use.     Without this process, it simply isn't possible to assure that we are providing  your outpatient care  with  the attention to detail we feel you deserve.   If we cannot assure that you're getting that kind of care,  then we cannot manage your problem effectively from this clinic.  Once you have seen Tammy and we are sure that we're all on the same page with your medication use she will arrange follow up with me.

## 2017-06-17 NOTE — Assessment & Plan Note (Signed)
Body mass index is 34.11 kg/m.  -  trending up slowly  Lab Results  Component Value Date   TSH 1.24 02/03/2017     Contributing to gerd risk/ doe/reviewed the need and the process to achieve and maintain neg calorie balance > defer f/u primary care including intermittently monitoring thyroid status

## 2017-06-17 NOTE — Progress Notes (Signed)
Subjective:    Patient ID: Garrett [name], male    DOB: 11-17-1951,   MRN: [id number]   Brief patient profile:  71 yowm quit smoking 1992 and exposed to dust in [place] until age of 37  no respiratory problems except for OSA until fall 2016 with recurrent coughing/ sob/ wheezing since then already rx with Symbicort/ saba so referred to pulmonary clinic 03/21/2016 by Dr Etter Sjogren with abn ct chest.   History of Present Illness  03/21/2016 1st [facility] office visit/ Garrett [name]  On maint symbicort 160  Chief Complaint  Patient presents with  . Pulmonary Consult    Referred by Dr. Etter Sjogren. Pt states he had PNA recently and has had cough and SOB for the past few months- esp worse over the past 6 wks. He gets SOB walking short distances such as to his mailbox. His cough is prod at times with yellow/green sputum.    prior to 6 m prior to OV  Back and forth mb fine/ no need for any inhalers but breathing never right since, best rx is prednisone no def resp to symbicort or saba  rec Stop Altace and start avapro  150 mg  One half daily - take a whole if blood pressure too high Pantoprazole Take 30- 60 min before your first and last meals of the day  GERD diet  If get worse > Prednisone 10 mg take  4 each am x 2 days,   2 each am x 2 days,  1 each am x 2 days and stop     05/02/2016  f/u ov/Garrett [name] re:  prob acei uacs / mpns  Chief Complaint  Patient presents with  . Follow-up    Breathing is much improved- almost back to his normal baseline. He is no longer coughing. Has not had to use albuterol.    improved p 2 weeks off acei and now Not limited by breathing from desired activities   rec We will call you for follow up ct in Sept 2017> no change in old nodules, one new only 6 mm > rec 12 m f/u     02/03/2017  Acute extended  ov/Garrett [name] re: new sob  Chief Complaint  Patient presents with  . Follow-up    upper airway cough syndrome, not having much cough but still struggles with dyspnea with exertion    acute ill on a 01/17/17  with fever chills and sob and minimal cough dry > UC same Sunday 01/19/17 Rocephin /levaquin 500 x 10 days and pred rec  Then doxy still has one day left but breathing but sob x 50 fts  Baseline = slower than nl = MMRC2 = can't walk a nl pace on a flat grade s sob but does fine slow and flat eg walking at mall  Not on any kind of regular use inhaler - symbicort listed but not taking Now on neb and it too does not really help his sob Taking ppi but not ac       No noct symptoms at all  rec Protonix Take 30- 60 min before your first and last meals of the day  GERD diet  CTa 02/04/17 > pos PE > xarelto     02/10/2017  Post hosp f/u ov/transition of care / Garrett [name] re: doe / dx of PE/ not using symb very rare need for saba/ never neb  Chief Complaint  Patient presents with  . Follow-up    F/u for CT results. Breathing has  been getting better since last OV.   Not limited by breathing from desired activities  But Relatively sedentary. rec Only use your albuterol as a rescue medication   - Ok to use up to 2 puffs  every 4 hours  But if you start needing it for any reason more than you do now then you need to restart regular use of symbicort 80 Take 2 puffs first thing in am and then another 2 puffs about 12 hours later.      03/07/17 NP rec Continue using your Albuterol Nebs and rescue inhaler as needed for shortness of breath or wheezing. Continue on CPAP at bedtime. You appear to be benefiting from the treatment Goal is to wear for at least 4-6 hours each night for maximal clinical benefit. Continue to work on weight loss, as the link between excess weight  and sleep apnea is well established.  Do not drive if sleepy. Follow up with Dr. Melvyn Novas In 3  Months. or before as needed.  Keep August Appointment ( This appointment is for lung nodules) Continue taking Xarelto 20 mg daily.    06/17/2017  f/u ov/Garrett [name] re: unexplained sob s/p remote pe maint on xarelto / on  multiple inhalers with poor insight  Chief Complaint  Patient presents with  . Follow-up    Pt states he had a bad wk last wk with his breathing- relates to humid weather. He states he was using atrovent and proair both every day. He uses neb once per wk on average. He has occ dry cough.   woke up nl hour with cough and sob rx  Variably sob with activity, "atorvent works the best"  Worse sob/ cough out in heat typically sleeps fine but freq am flares of cough/sob   No obvious other patterns in  day to day or daytime variability or assoc excess/ purulent sputum or mucus plugs or hemoptysis or cp or chest tightness, subjective wheeze or overt sinus or hb symptoms. No unusual exp hx or h/o childhood pna/ asthma or knowledge of premature birth.  Sleeping ok without nocturnal   exacerbation  of respiratory  c/o's or need for noct saba. Also denies any obvious fluctuation of symptoms with weather or environmental changes or other aggravating or alleviating factors except as outlined above   Current Medications, Allergies, Complete Past Medical History, Past Surgical History, Family History, and Social History were reviewed in [employer] record.  ROS  The following are not active complaints unless bolded sore throat, dysphagia, dental problems, itching, sneezing,  nasal congestion or excess/ purulent secretions, ear ache,   fever, chills, sweats, unintended wt loss, classically pleuritic or exertional cp,  orthopnea pnd or leg swelling, presyncope, palpitations, abdominal pain, anorexia, nausea, vomiting, diarrhea  or change in bowel or bladder habits, change in stools or urine, dysuria,hematuria,  rash, arthralgias, visual complaints, headache, numbness, weakness or ataxia or problems with walking or coordination,  change in mood/affect or memory.                   Objective:   Physical Exam  amb wm  nad  / hard of hearing / a bit slow mentation   06/17/2017        231    02/10/2017        230  02/03/2017        229  05/02/2016        226  03/21/16 226 lb 3.2 oz (102.604 kg)  03/11/16  222 lb 6.4 oz (100.88 kg)  03/06/16 229 lb (103.874 kg)    Vital signs reviewed  -  - Note on arrival 02 sats  97% on RA     HEENT: nl   turbinates, and oropharynx. Nl external ear canals without cough reflex - full set  Of dentures   NECK :  without JVD/Nodes/TM/ nl carotid upstrokes bilaterally   LUNGS: no acc muscle use,  Nl contour chest which is clear to A and P bilaterally without cough on insp or exp maneuvers   CV:  RRR  no s3 or murmur or increase in P2, no edema   ABD:  soft and nontender with nl inspiratory excursion in the supine position. No bruits or organomegaly, bowel sounds nl  MS:  Nl gait/ ext warm without deformities, calf tenderness, cyanosis or clubbing No obvious joint restrictions   SKIN: warm and dry without lesions    NEURO:  alert, approp, nl sensorium with  no motor deficits        I personally reviewed images and agree with radiology impression as follows:   Chest CT a  05/13/17  No definite evidence of pulmonary embolus.  Moderate size sliding-type hiatal hernia.          Assessment & Plan:

## 2017-06-17 NOTE — Assessment & Plan Note (Signed)
06/17/2017  After extensive coaching HFA effectiveness =    90% > change back to symbicort 80 2bid   Symptoms are markedly disproportionate to objective findings and not clear this is actually much of a  lung problem but pt does appear to have difficult to sort out respiratory symptoms of unknown origin for which  DDX  = almost all start with A and  include Adherence, Ace Inhibitors, Acid Reflux, Active Sinus Disease, Alpha 1 Antitripsin deficiency, Anxiety masquerading as Airways dz,  ABPA,  Allergy(esp in young), Aspiration (esp in elderly), Adverse effects of meds,  Active smokers, A bunch of PE's (a small clot burden can't cause this syndrome unless there is already severe underlying pulm or vascular dz with poor reserve) plus two Bs  = Bronchiectasis and Beta blocker use..and one C= CHF     Adherence is always the initial "prime suspect" and is a multilayered concern that requires a "trust but verify" approach in every patient - starting with knowing how to use medications, especially inhalers, correctly, keeping up with refills and understanding the fundamental difference between maintenance and prns vs those medications only taken for a very short course and then stopped and not refilled.  - hfa teaching/ see above - return with all meds in hand using a trust but verify approach to confirm accurate Medication  Reconciliation The principal here is that until we are certain that the  patients are doing what we've asked, it makes no sense to ask them to do more.  - needs med reconciliation:   To keep things simple, I have asked the patient to first separate medicines that are perceived as maintenance, that is to be taken daily "no matter what", from those medicines that are taken on only on an as-needed basis and I have given the patient examples of both, and then return to see our NP to generate a  detailed  medication calendar which should be followed until the next physician sees the patient and  updates it.   ? Acid (or non-acid) GERD > always difficult to exclude as up to 75% of pts in some series report no assoc GI/ Heartburn symptoms and he has a large HH > rec max (24h)  acid suppression and diet restrictions/ reviewed and instructions given in writing.    ? Allergy/ asthma > symb 80 2bid should suffice  ? A bunch of PE's > unlikely on xarelto, continue   ? Beta blocker >  Unlikely on low dose BB  I had an extended discussion with the patient reviewing all relevant studies (including extensive inpt/outpt notes/studies) completed to date and  lasting 25 minutes of a 40  minute office visit transition of care since last admit     re  severe non-specific but potentially very serious refractory respiratory symptoms of uncertain and potentially multiple  etiologies.   Each maintenance medication was reviewed in detail including most importantly the difference between maintenance and prns and under what circumstances the prns are to be triggered using an action plan format that is not reflected in the computer generated alphabetically organized AVS.    Please see AVS for specific instructions unique to this office visit that I personally wrote and verbalized to the the pt in detail and then reviewed with pt  by my nurse highlighting any changes in therapy/plan of care  recommended at today's visit.

## 2017-07-03 ENCOUNTER — Encounter: Payer: Self-pay | Admitting: Family Medicine

## 2017-07-03 DIAGNOSIS — D500 Iron deficiency anemia secondary to blood loss (chronic): Secondary | ICD-10-CM

## 2017-07-03 DIAGNOSIS — G8929 Other chronic pain: Secondary | ICD-10-CM

## 2017-07-03 DIAGNOSIS — I2699 Other pulmonary embolism without acute cor pulmonale: Secondary | ICD-10-CM

## 2017-07-03 MED ORDER — CELECOXIB 200 MG PO CAPS: 200.0000 mg | ORAL_CAPSULE | Freq: Every day | ORAL | 3 refills | Status: DC

## 2017-07-03 MED ORDER — GABAPENTIN 300 MG PO CAPS: 300.0000 mg | ORAL_CAPSULE | Freq: Every day | ORAL | 1 refills | Status: DC

## 2017-07-03 MED FILL — LEVOCETIRIZINE 5 MG TABLET: 5 | 30 days supply | Qty: 30 | Fill #3

## 2017-07-03 MED FILL — CELECOXIB 200 MG CAP: 200 | 90 days supply | Qty: 90 | Fill #0

## 2017-07-03 MED FILL — ALLOPURINOL 100 MG TABLET: 100 | 90 days supply | Qty: 90 | Fill #1

## 2017-07-03 MED FILL — GABAPENTIN 300 MG CAPSULE: 300 | 90 days supply | Qty: 90 | Fill #0

## 2017-07-04 ENCOUNTER — Encounter: Payer: Self-pay | Admitting: Family Medicine

## 2017-07-04 DIAGNOSIS — Z8669 Personal history of other diseases of the nervous system and sense organs: Secondary | ICD-10-CM

## 2017-07-04 DIAGNOSIS — I10 Essential (primary) hypertension: Secondary | ICD-10-CM

## 2017-07-04 MED ORDER — TOPIRAMATE 25 MG PO TABS: 25.0000 mg | ORAL_TABLET | Freq: Three times a day (TID) | ORAL | 3 refills | Status: DC

## 2017-07-04 MED ORDER — FUROSEMIDE 20 MG PO TABS: 20.0000 mg | ORAL_TABLET | Freq: Every day | ORAL | 3 refills | Status: DC

## 2017-07-04 MED FILL — FUROSEMIDE 20 MG TABLET: 20 | 90 days supply | Qty: 90 | Fill #0

## 2017-07-04 MED FILL — TOPIRAMATE 25 MG TAB: 25 | 90 days supply | Qty: 270 | Fill #0

## 2017-07-15 MED FILL — VERAPAMIL ER 240 MG TABLET: 240 | 90 days supply | Qty: 90 | Fill #1

## 2017-07-15 MED FILL — POTASSIUM CL ER 20 MEQ TAB: 20 | 45 days supply | Qty: 90 | Fill #1

## 2017-07-15 MED FILL — MEMANTINE HCL 10 MG TABLET: 10 | 90 days supply | Qty: 180 | Fill #1

## 2017-07-16 DIAGNOSIS — M5442 Lumbago with sciatica, left side: Secondary | ICD-10-CM | POA: Diagnosis not present

## 2017-07-16 DIAGNOSIS — M47816 Spondylosis without myelopathy or radiculopathy, lumbar region: Secondary | ICD-10-CM | POA: Diagnosis not present

## 2017-07-16 DIAGNOSIS — M5387 Other specified dorsopathies, lumbosacral region: Secondary | ICD-10-CM | POA: Diagnosis not present

## 2017-07-16 DIAGNOSIS — M4607 Spinal enthesopathy, lumbosacral region: Secondary | ICD-10-CM | POA: Diagnosis not present

## 2017-07-16 DIAGNOSIS — M9904 Segmental and somatic dysfunction of sacral region: Secondary | ICD-10-CM | POA: Diagnosis not present

## 2017-07-16 DIAGNOSIS — M5417 Radiculopathy, lumbosacral region: Secondary | ICD-10-CM | POA: Diagnosis not present

## 2017-07-16 DIAGNOSIS — M5136 Other intervertebral disc degeneration, lumbar region: Secondary | ICD-10-CM | POA: Diagnosis not present

## 2017-07-16 DIAGNOSIS — M9903 Segmental and somatic dysfunction of lumbar region: Secondary | ICD-10-CM | POA: Diagnosis not present

## 2017-07-16 DIAGNOSIS — M5386 Other specified dorsopathies, lumbar region: Secondary | ICD-10-CM | POA: Diagnosis not present

## 2017-07-17 DIAGNOSIS — M9904 Segmental and somatic dysfunction of sacral region: Secondary | ICD-10-CM | POA: Diagnosis not present

## 2017-07-17 DIAGNOSIS — M5136 Other intervertebral disc degeneration, lumbar region: Secondary | ICD-10-CM | POA: Diagnosis not present

## 2017-07-17 DIAGNOSIS — M47816 Spondylosis without myelopathy or radiculopathy, lumbar region: Secondary | ICD-10-CM | POA: Diagnosis not present

## 2017-07-17 DIAGNOSIS — M9903 Segmental and somatic dysfunction of lumbar region: Secondary | ICD-10-CM | POA: Diagnosis not present

## 2017-07-22 DIAGNOSIS — M5387 Other specified dorsopathies, lumbosacral region: Secondary | ICD-10-CM | POA: Diagnosis not present

## 2017-07-22 DIAGNOSIS — M5417 Radiculopathy, lumbosacral region: Secondary | ICD-10-CM | POA: Diagnosis not present

## 2017-07-22 DIAGNOSIS — M9904 Segmental and somatic dysfunction of sacral region: Secondary | ICD-10-CM | POA: Diagnosis not present

## 2017-07-22 DIAGNOSIS — M9903 Segmental and somatic dysfunction of lumbar region: Secondary | ICD-10-CM | POA: Diagnosis not present

## 2017-07-22 DIAGNOSIS — M4607 Spinal enthesopathy, lumbosacral region: Secondary | ICD-10-CM | POA: Diagnosis not present

## 2017-07-22 DIAGNOSIS — M5136 Other intervertebral disc degeneration, lumbar region: Secondary | ICD-10-CM | POA: Diagnosis not present

## 2017-07-22 DIAGNOSIS — M5386 Other specified dorsopathies, lumbar region: Secondary | ICD-10-CM | POA: Diagnosis not present

## 2017-07-22 DIAGNOSIS — M47816 Spondylosis without myelopathy or radiculopathy, lumbar region: Secondary | ICD-10-CM | POA: Diagnosis not present

## 2017-07-22 DIAGNOSIS — M5442 Lumbago with sciatica, left side: Secondary | ICD-10-CM | POA: Diagnosis not present

## 2017-07-23 ENCOUNTER — Encounter: Payer: PPO | Admitting: Adult Health

## 2017-07-23 DIAGNOSIS — M9904 Segmental and somatic dysfunction of sacral region: Secondary | ICD-10-CM | POA: Diagnosis not present

## 2017-07-23 DIAGNOSIS — M5136 Other intervertebral disc degeneration, lumbar region: Secondary | ICD-10-CM | POA: Diagnosis not present

## 2017-07-23 DIAGNOSIS — M9903 Segmental and somatic dysfunction of lumbar region: Secondary | ICD-10-CM | POA: Diagnosis not present

## 2017-07-23 DIAGNOSIS — M47816 Spondylosis without myelopathy or radiculopathy, lumbar region: Secondary | ICD-10-CM | POA: Diagnosis not present

## 2017-07-24 DIAGNOSIS — M5136 Other intervertebral disc degeneration, lumbar region: Secondary | ICD-10-CM | POA: Diagnosis not present

## 2017-07-24 DIAGNOSIS — M9903 Segmental and somatic dysfunction of lumbar region: Secondary | ICD-10-CM | POA: Diagnosis not present

## 2017-07-24 DIAGNOSIS — M4607 Spinal enthesopathy, lumbosacral region: Secondary | ICD-10-CM | POA: Diagnosis not present

## 2017-07-24 DIAGNOSIS — M47816 Spondylosis without myelopathy or radiculopathy, lumbar region: Secondary | ICD-10-CM | POA: Diagnosis not present

## 2017-07-24 DIAGNOSIS — M5386 Other specified dorsopathies, lumbar region: Secondary | ICD-10-CM | POA: Diagnosis not present

## 2017-07-24 DIAGNOSIS — M5442 Lumbago with sciatica, left side: Secondary | ICD-10-CM | POA: Diagnosis not present

## 2017-07-24 DIAGNOSIS — M5417 Radiculopathy, lumbosacral region: Secondary | ICD-10-CM | POA: Diagnosis not present

## 2017-07-24 DIAGNOSIS — M9904 Segmental and somatic dysfunction of sacral region: Secondary | ICD-10-CM | POA: Diagnosis not present

## 2017-07-24 DIAGNOSIS — M5387 Other specified dorsopathies, lumbosacral region: Secondary | ICD-10-CM | POA: Diagnosis not present

## 2017-07-29 ENCOUNTER — Encounter: Payer: Self-pay | Admitting: Family Medicine

## 2017-07-29 DIAGNOSIS — M9904 Segmental and somatic dysfunction of sacral region: Secondary | ICD-10-CM | POA: Diagnosis not present

## 2017-07-29 DIAGNOSIS — M9903 Segmental and somatic dysfunction of lumbar region: Secondary | ICD-10-CM | POA: Diagnosis not present

## 2017-07-29 DIAGNOSIS — M5136 Other intervertebral disc degeneration, lumbar region: Secondary | ICD-10-CM | POA: Diagnosis not present

## 2017-07-29 DIAGNOSIS — K219 Gastro-esophageal reflux disease without esophagitis: Secondary | ICD-10-CM

## 2017-07-29 DIAGNOSIS — M5417 Radiculopathy, lumbosacral region: Secondary | ICD-10-CM | POA: Diagnosis not present

## 2017-07-29 DIAGNOSIS — M5442 Lumbago with sciatica, left side: Secondary | ICD-10-CM | POA: Diagnosis not present

## 2017-07-29 DIAGNOSIS — M5387 Other specified dorsopathies, lumbosacral region: Secondary | ICD-10-CM | POA: Diagnosis not present

## 2017-07-29 DIAGNOSIS — M4607 Spinal enthesopathy, lumbosacral region: Secondary | ICD-10-CM | POA: Diagnosis not present

## 2017-07-29 DIAGNOSIS — M5386 Other specified dorsopathies, lumbar region: Secondary | ICD-10-CM | POA: Diagnosis not present

## 2017-07-29 DIAGNOSIS — M47816 Spondylosis without myelopathy or radiculopathy, lumbar region: Secondary | ICD-10-CM | POA: Diagnosis not present

## 2017-07-29 MED ORDER — METFORMIN HCL 500 MG PO TABS: 500.0000 mg | ORAL_TABLET | Freq: Two times a day (BID) | ORAL | 0 refills | Status: DC

## 2017-07-29 MED ORDER — PANTOPRAZOLE SODIUM 40 MG PO TBEC: 40.0000 mg | DELAYED_RELEASE_TABLET | Freq: Two times a day (BID) | ORAL | 3 refills | Status: DC

## 2017-07-29 MED FILL — LEVOCETIRIZINE 5 MG TABLET: 5 | 30 days supply | Qty: 30 | Fill #4

## 2017-07-29 MED FILL — PANTOPRAZOLE SOD DR 40 MG T: 40 | 90 days supply | Qty: 180 | Fill #0

## 2017-07-29 MED FILL — TERAZOSIN 5 MG CAPSULE: 5 | 90 days supply | Qty: 90 | Fill #3

## 2017-07-30 DIAGNOSIS — M9904 Segmental and somatic dysfunction of sacral region: Secondary | ICD-10-CM | POA: Diagnosis not present

## 2017-07-30 DIAGNOSIS — M5136 Other intervertebral disc degeneration, lumbar region: Secondary | ICD-10-CM | POA: Diagnosis not present

## 2017-07-30 DIAGNOSIS — M47816 Spondylosis without myelopathy or radiculopathy, lumbar region: Secondary | ICD-10-CM | POA: Diagnosis not present

## 2017-07-30 DIAGNOSIS — M9903 Segmental and somatic dysfunction of lumbar region: Secondary | ICD-10-CM | POA: Diagnosis not present

## 2017-07-31 DIAGNOSIS — M9903 Segmental and somatic dysfunction of lumbar region: Secondary | ICD-10-CM | POA: Diagnosis not present

## 2017-07-31 DIAGNOSIS — M9904 Segmental and somatic dysfunction of sacral region: Secondary | ICD-10-CM | POA: Diagnosis not present

## 2017-07-31 DIAGNOSIS — M5387 Other specified dorsopathies, lumbosacral region: Secondary | ICD-10-CM | POA: Diagnosis not present

## 2017-07-31 DIAGNOSIS — M4607 Spinal enthesopathy, lumbosacral region: Secondary | ICD-10-CM | POA: Diagnosis not present

## 2017-07-31 DIAGNOSIS — M5136 Other intervertebral disc degeneration, lumbar region: Secondary | ICD-10-CM | POA: Diagnosis not present

## 2017-07-31 DIAGNOSIS — M47816 Spondylosis without myelopathy or radiculopathy, lumbar region: Secondary | ICD-10-CM | POA: Diagnosis not present

## 2017-08-05 DIAGNOSIS — M47816 Spondylosis without myelopathy or radiculopathy, lumbar region: Secondary | ICD-10-CM | POA: Diagnosis not present

## 2017-08-05 DIAGNOSIS — M5387 Other specified dorsopathies, lumbosacral region: Secondary | ICD-10-CM | POA: Diagnosis not present

## 2017-08-05 DIAGNOSIS — M9903 Segmental and somatic dysfunction of lumbar region: Secondary | ICD-10-CM | POA: Diagnosis not present

## 2017-08-05 DIAGNOSIS — M9904 Segmental and somatic dysfunction of sacral region: Secondary | ICD-10-CM | POA: Diagnosis not present

## 2017-08-05 DIAGNOSIS — M4607 Spinal enthesopathy, lumbosacral region: Secondary | ICD-10-CM | POA: Diagnosis not present

## 2017-08-05 DIAGNOSIS — M5136 Other intervertebral disc degeneration, lumbar region: Secondary | ICD-10-CM | POA: Diagnosis not present

## 2017-08-06 DIAGNOSIS — M9904 Segmental and somatic dysfunction of sacral region: Secondary | ICD-10-CM | POA: Diagnosis not present

## 2017-08-06 DIAGNOSIS — M9903 Segmental and somatic dysfunction of lumbar region: Secondary | ICD-10-CM | POA: Diagnosis not present

## 2017-08-06 DIAGNOSIS — M47816 Spondylosis without myelopathy or radiculopathy, lumbar region: Secondary | ICD-10-CM | POA: Diagnosis not present

## 2017-08-06 DIAGNOSIS — M5136 Other intervertebral disc degeneration, lumbar region: Secondary | ICD-10-CM | POA: Diagnosis not present

## 2017-08-07 DIAGNOSIS — M9904 Segmental and somatic dysfunction of sacral region: Secondary | ICD-10-CM | POA: Diagnosis not present

## 2017-08-07 DIAGNOSIS — M9903 Segmental and somatic dysfunction of lumbar region: Secondary | ICD-10-CM | POA: Diagnosis not present

## 2017-08-07 DIAGNOSIS — M5136 Other intervertebral disc degeneration, lumbar region: Secondary | ICD-10-CM | POA: Diagnosis not present

## 2017-08-07 DIAGNOSIS — M47816 Spondylosis without myelopathy or radiculopathy, lumbar region: Secondary | ICD-10-CM | POA: Diagnosis not present

## 2017-08-11 ENCOUNTER — Encounter: Payer: Self-pay | Admitting: Family Medicine

## 2017-08-11 ENCOUNTER — Ambulatory Visit: Payer: PPO | Admitting: Internal Medicine

## 2017-08-11 DIAGNOSIS — D500 Iron deficiency anemia secondary to blood loss (chronic): Secondary | ICD-10-CM

## 2017-08-11 DIAGNOSIS — I2699 Other pulmonary embolism without acute cor pulmonale: Secondary | ICD-10-CM

## 2017-08-12 ENCOUNTER — Encounter: Payer: Self-pay | Admitting: Adult Health

## 2017-08-12 ENCOUNTER — Ambulatory Visit (INDEPENDENT_AMBULATORY_CARE_PROVIDER_SITE_OTHER): Payer: PPO | Admitting: Adult Health

## 2017-08-12 DIAGNOSIS — M5417 Radiculopathy, lumbosacral region: Secondary | ICD-10-CM | POA: Diagnosis not present

## 2017-08-12 DIAGNOSIS — R918 Other nonspecific abnormal finding of lung field: Secondary | ICD-10-CM

## 2017-08-12 DIAGNOSIS — J45991 Cough variant asthma: Secondary | ICD-10-CM

## 2017-08-12 DIAGNOSIS — M5442 Lumbago with sciatica, left side: Secondary | ICD-10-CM | POA: Diagnosis not present

## 2017-08-12 DIAGNOSIS — M5136 Other intervertebral disc degeneration, lumbar region: Secondary | ICD-10-CM | POA: Diagnosis not present

## 2017-08-12 DIAGNOSIS — M4607 Spinal enthesopathy, lumbosacral region: Secondary | ICD-10-CM | POA: Diagnosis not present

## 2017-08-12 DIAGNOSIS — I2699 Other pulmonary embolism without acute cor pulmonale: Secondary | ICD-10-CM | POA: Diagnosis not present

## 2017-08-12 DIAGNOSIS — M9904 Segmental and somatic dysfunction of sacral region: Secondary | ICD-10-CM | POA: Diagnosis not present

## 2017-08-12 DIAGNOSIS — M47816 Spondylosis without myelopathy or radiculopathy, lumbar region: Secondary | ICD-10-CM | POA: Diagnosis not present

## 2017-08-12 DIAGNOSIS — M5387 Other specified dorsopathies, lumbosacral region: Secondary | ICD-10-CM | POA: Diagnosis not present

## 2017-08-12 DIAGNOSIS — M9903 Segmental and somatic dysfunction of lumbar region: Secondary | ICD-10-CM | POA: Diagnosis not present

## 2017-08-12 DIAGNOSIS — M5386 Other specified dorsopathies, lumbar region: Secondary | ICD-10-CM | POA: Diagnosis not present

## 2017-08-12 NOTE — Assessment & Plan Note (Signed)
Appears to be doing well on Xarelto .  Cont on current regimen  Follow with Hemaotology as planned

## 2017-08-12 NOTE — Assessment & Plan Note (Signed)
Stable on CT chest  Plan on serial follow up 04/2018 .
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
## 2017-08-12 NOTE — Assessment & Plan Note (Signed)
Stable on Symbicort  No changs

## 2017-08-12 NOTE — Patient Instructions (Signed)
Continue on current regimen .  Follow up with Dr. Melvyn Novas  In 4 months and As needed

## 2017-08-13 DIAGNOSIS — M5136 Other intervertebral disc degeneration, lumbar region: Secondary | ICD-10-CM | POA: Diagnosis not present

## 2017-08-13 DIAGNOSIS — M4607 Spinal enthesopathy, lumbosacral region: Secondary | ICD-10-CM | POA: Diagnosis not present

## 2017-08-13 DIAGNOSIS — M5417 Radiculopathy, lumbosacral region: Secondary | ICD-10-CM | POA: Diagnosis not present

## 2017-08-13 DIAGNOSIS — M47816 Spondylosis without myelopathy or radiculopathy, lumbar region: Secondary | ICD-10-CM | POA: Diagnosis not present

## 2017-08-13 DIAGNOSIS — M9903 Segmental and somatic dysfunction of lumbar region: Secondary | ICD-10-CM | POA: Diagnosis not present

## 2017-08-13 DIAGNOSIS — M5442 Lumbago with sciatica, left side: Secondary | ICD-10-CM | POA: Diagnosis not present

## 2017-08-13 DIAGNOSIS — M5387 Other specified dorsopathies, lumbosacral region: Secondary | ICD-10-CM | POA: Diagnosis not present

## 2017-08-13 DIAGNOSIS — M5386 Other specified dorsopathies, lumbar region: Secondary | ICD-10-CM | POA: Diagnosis not present

## 2017-08-13 DIAGNOSIS — M9904 Segmental and somatic dysfunction of sacral region: Secondary | ICD-10-CM | POA: Diagnosis not present

## 2017-08-13 MED ORDER — CELECOXIB 200 MG PO CAPS: 200.0000 mg | ORAL_CAPSULE | Freq: Two times a day (BID) | ORAL | 0 refills | Status: DC

## 2017-08-13 NOTE — Progress Notes (Signed)
Chart and office note reviewed in detail  > agree with a/p as outlined    

## 2017-08-14 DIAGNOSIS — M9903 Segmental and somatic dysfunction of lumbar region: Secondary | ICD-10-CM | POA: Diagnosis not present

## 2017-08-14 DIAGNOSIS — M9904 Segmental and somatic dysfunction of sacral region: Secondary | ICD-10-CM | POA: Diagnosis not present

## 2017-08-14 DIAGNOSIS — M47816 Spondylosis without myelopathy or radiculopathy, lumbar region: Secondary | ICD-10-CM | POA: Diagnosis not present

## 2017-08-14 DIAGNOSIS — M5136 Other intervertebral disc degeneration, lumbar region: Secondary | ICD-10-CM | POA: Diagnosis not present

## 2017-08-14 MED FILL — metFORMIN HCL 500 MG TABS: 500 | 90 days supply | Qty: 180 | Fill #0

## 2017-08-14 MED FILL — CELECOXIB 200 MG CAPS: 200 | 90 days supply | Qty: 180 | Fill #0

## 2017-08-19 DIAGNOSIS — M5136 Other intervertebral disc degeneration, lumbar region: Secondary | ICD-10-CM | POA: Diagnosis not present

## 2017-08-19 DIAGNOSIS — M9903 Segmental and somatic dysfunction of lumbar region: Secondary | ICD-10-CM | POA: Diagnosis not present

## 2017-08-19 DIAGNOSIS — M47816 Spondylosis without myelopathy or radiculopathy, lumbar region: Secondary | ICD-10-CM | POA: Diagnosis not present

## 2017-08-19 DIAGNOSIS — M9904 Segmental and somatic dysfunction of sacral region: Secondary | ICD-10-CM | POA: Diagnosis not present

## 2017-08-20 DIAGNOSIS — M4607 Spinal enthesopathy, lumbosacral region: Secondary | ICD-10-CM | POA: Diagnosis not present

## 2017-08-20 DIAGNOSIS — M5387 Other specified dorsopathies, lumbosacral region: Secondary | ICD-10-CM | POA: Diagnosis not present

## 2017-08-20 DIAGNOSIS — M5417 Radiculopathy, lumbosacral region: Secondary | ICD-10-CM | POA: Diagnosis not present

## 2017-08-20 DIAGNOSIS — M5442 Lumbago with sciatica, left side: Secondary | ICD-10-CM | POA: Diagnosis not present

## 2017-08-20 DIAGNOSIS — M9904 Segmental and somatic dysfunction of sacral region: Secondary | ICD-10-CM | POA: Diagnosis not present

## 2017-08-20 DIAGNOSIS — M9903 Segmental and somatic dysfunction of lumbar region: Secondary | ICD-10-CM | POA: Diagnosis not present

## 2017-08-20 DIAGNOSIS — M5136 Other intervertebral disc degeneration, lumbar region: Secondary | ICD-10-CM | POA: Diagnosis not present

## 2017-08-20 DIAGNOSIS — M5386 Other specified dorsopathies, lumbar region: Secondary | ICD-10-CM | POA: Diagnosis not present

## 2017-08-20 DIAGNOSIS — M47816 Spondylosis without myelopathy or radiculopathy, lumbar region: Secondary | ICD-10-CM | POA: Diagnosis not present

## 2017-08-21 DIAGNOSIS — M5136 Other intervertebral disc degeneration, lumbar region: Secondary | ICD-10-CM | POA: Diagnosis not present

## 2017-08-21 DIAGNOSIS — M9903 Segmental and somatic dysfunction of lumbar region: Secondary | ICD-10-CM | POA: Diagnosis not present

## 2017-08-21 DIAGNOSIS — M9904 Segmental and somatic dysfunction of sacral region: Secondary | ICD-10-CM | POA: Diagnosis not present

## 2017-08-21 DIAGNOSIS — M47816 Spondylosis without myelopathy or radiculopathy, lumbar region: Secondary | ICD-10-CM | POA: Diagnosis not present

## 2017-08-26 ENCOUNTER — Encounter: Payer: Self-pay | Admitting: Family Medicine

## 2017-08-26 DIAGNOSIS — E876 Hypokalemia: Secondary | ICD-10-CM

## 2017-08-26 DIAGNOSIS — I2699 Other pulmonary embolism without acute cor pulmonale: Secondary | ICD-10-CM

## 2017-08-26 DIAGNOSIS — M5136 Other intervertebral disc degeneration, lumbar region: Secondary | ICD-10-CM | POA: Diagnosis not present

## 2017-08-26 DIAGNOSIS — M9904 Segmental and somatic dysfunction of sacral region: Secondary | ICD-10-CM | POA: Diagnosis not present

## 2017-08-26 DIAGNOSIS — M9903 Segmental and somatic dysfunction of lumbar region: Secondary | ICD-10-CM | POA: Diagnosis not present

## 2017-08-26 DIAGNOSIS — M47816 Spondylosis without myelopathy or radiculopathy, lumbar region: Secondary | ICD-10-CM | POA: Diagnosis not present

## 2017-08-26 MED ORDER — RIVAROXABAN 20 MG PO TABS: 20.0000 mg | ORAL_TABLET | Freq: Every day | ORAL | 1 refills | Status: DC

## 2017-08-26 MED ORDER — POTASSIUM CHLORIDE CRYS ER 20 MEQ PO TBCR: 40.0000 meq | EXTENDED_RELEASE_TABLET | Freq: Every day | ORAL | 1 refills | Status: DC

## 2017-08-26 MED FILL — XARELTO 20 MG TABLET: 20 | 90 days supply | Qty: 90 | Fill #0

## 2017-08-26 MED FILL — POTASSIUM CL ER 20 MEQ TAB: 20 | 45 days supply | Qty: 90 | Fill #0

## 2017-08-27 DIAGNOSIS — M9903 Segmental and somatic dysfunction of lumbar region: Secondary | ICD-10-CM | POA: Diagnosis not present

## 2017-08-27 DIAGNOSIS — M5136 Other intervertebral disc degeneration, lumbar region: Secondary | ICD-10-CM | POA: Diagnosis not present

## 2017-08-27 DIAGNOSIS — M47816 Spondylosis without myelopathy or radiculopathy, lumbar region: Secondary | ICD-10-CM | POA: Diagnosis not present

## 2017-08-27 DIAGNOSIS — M9904 Segmental and somatic dysfunction of sacral region: Secondary | ICD-10-CM | POA: Diagnosis not present

## 2017-08-28 DIAGNOSIS — M4607 Spinal enthesopathy, lumbosacral region: Secondary | ICD-10-CM | POA: Diagnosis not present

## 2017-08-28 DIAGNOSIS — M9903 Segmental and somatic dysfunction of lumbar region: Secondary | ICD-10-CM | POA: Diagnosis not present

## 2017-08-28 DIAGNOSIS — M5136 Other intervertebral disc degeneration, lumbar region: Secondary | ICD-10-CM | POA: Diagnosis not present

## 2017-08-28 DIAGNOSIS — M5387 Other specified dorsopathies, lumbosacral region: Secondary | ICD-10-CM | POA: Diagnosis not present

## 2017-08-28 DIAGNOSIS — M47816 Spondylosis without myelopathy or radiculopathy, lumbar region: Secondary | ICD-10-CM | POA: Diagnosis not present

## 2017-08-28 DIAGNOSIS — M9904 Segmental and somatic dysfunction of sacral region: Secondary | ICD-10-CM | POA: Diagnosis not present

## 2017-09-02 MED FILL — LEVOCETIRIZINE 5 MG TABLET: 5 | 30 days supply | Qty: 30 | Fill #5

## 2017-09-03 DIAGNOSIS — M9903 Segmental and somatic dysfunction of lumbar region: Secondary | ICD-10-CM | POA: Diagnosis not present

## 2017-09-03 DIAGNOSIS — M5136 Other intervertebral disc degeneration, lumbar region: Secondary | ICD-10-CM | POA: Diagnosis not present

## 2017-09-03 DIAGNOSIS — M9904 Segmental and somatic dysfunction of sacral region: Secondary | ICD-10-CM | POA: Diagnosis not present

## 2017-09-03 DIAGNOSIS — M47816 Spondylosis without myelopathy or radiculopathy, lumbar region: Secondary | ICD-10-CM | POA: Diagnosis not present

## 2017-09-09 ENCOUNTER — Other Ambulatory Visit: Payer: Self-pay | Admitting: Family Medicine

## 2017-09-09 DIAGNOSIS — M5136 Other intervertebral disc degeneration, lumbar region: Secondary | ICD-10-CM | POA: Diagnosis not present

## 2017-09-09 DIAGNOSIS — M47816 Spondylosis without myelopathy or radiculopathy, lumbar region: Secondary | ICD-10-CM | POA: Diagnosis not present

## 2017-09-09 DIAGNOSIS — M5440 Lumbago with sciatica, unspecified side: Secondary | ICD-10-CM

## 2017-09-09 DIAGNOSIS — M9903 Segmental and somatic dysfunction of lumbar region: Secondary | ICD-10-CM | POA: Diagnosis not present

## 2017-09-09 DIAGNOSIS — M9904 Segmental and somatic dysfunction of sacral region: Secondary | ICD-10-CM | POA: Diagnosis not present

## 2017-09-09 MED FILL — METOPROLOL TARTRATE 25 MG T: 25 | 90 days supply | Qty: 90 | Fill #2

## 2017-09-10 DIAGNOSIS — M4317 Spondylolisthesis, lumbosacral region: Secondary | ICD-10-CM | POA: Diagnosis not present

## 2017-09-10 DIAGNOSIS — M47816 Spondylosis without myelopathy or radiculopathy, lumbar region: Secondary | ICD-10-CM | POA: Diagnosis not present

## 2017-09-10 DIAGNOSIS — M9904 Segmental and somatic dysfunction of sacral region: Secondary | ICD-10-CM | POA: Diagnosis not present

## 2017-09-10 DIAGNOSIS — M545 Low back pain: Secondary | ICD-10-CM | POA: Diagnosis not present

## 2017-09-10 DIAGNOSIS — M9903 Segmental and somatic dysfunction of lumbar region: Secondary | ICD-10-CM | POA: Diagnosis not present

## 2017-09-10 DIAGNOSIS — M5136 Other intervertebral disc degeneration, lumbar region: Secondary | ICD-10-CM | POA: Diagnosis not present

## 2017-09-10 DIAGNOSIS — M62830 Muscle spasm of back: Secondary | ICD-10-CM | POA: Diagnosis not present

## 2017-09-11 ENCOUNTER — Ambulatory Visit
Admission: RE | Admit: 2017-09-11 | Discharge: 2017-09-11 | Disposition: A | Discharge status: Home / Self Care | Payer: PPO | Source: Ambulatory Visit | Attending: Family Medicine | Admitting: Family Medicine

## 2017-09-11 DIAGNOSIS — M5440 Lumbago with sciatica, unspecified side: Secondary | ICD-10-CM

## 2017-09-11 DIAGNOSIS — M48061 Spinal stenosis, lumbar region without neurogenic claudication: Secondary | ICD-10-CM | POA: Diagnosis not present

## 2017-09-22 ENCOUNTER — Other Ambulatory Visit: Payer: Self-pay | Admitting: Family Medicine

## 2017-09-22 DIAGNOSIS — E785 Hyperlipidemia, unspecified: Secondary | ICD-10-CM

## 2017-09-22 MED FILL — GABAPENTIN 300 MG CAPSULE: 300 | 90 days supply | Qty: 90 | Fill #1

## 2017-09-22 MED FILL — PRAVASTATIN NA 10 MG TAB: 10 | 90 days supply | Qty: 90 | Fill #0

## 2017-09-30 MED FILL — ALLOPURINOL 100 MG TABS: 100 | 90 days supply | Qty: 90 | Fill #2

## 2017-09-30 MED FILL — TOPIRAMATE 25 MG TAB: 25 | 90 days supply | Qty: 270 | Fill #1

## 2017-09-30 MED FILL — LEVOCETIRIZINE 5 MG TABLET: 5 | 30 days supply | Qty: 30 | Fill #3

## 2017-10-06 MED FILL — FUROSEMIDE 20 MG TAB: 20 | 90 days supply | Qty: 90 | Fill #1

## 2017-10-07 ENCOUNTER — Ambulatory Visit (INDEPENDENT_AMBULATORY_CARE_PROVIDER_SITE_OTHER): Payer: PPO

## 2017-10-07 ENCOUNTER — Ambulatory Visit: Payer: Self-pay | Admitting: Family Medicine

## 2017-10-07 DIAGNOSIS — Z0289 Encounter for other administrative examinations: Secondary | ICD-10-CM

## 2017-10-07 DIAGNOSIS — Z23 Encounter for immunization: Secondary | ICD-10-CM

## 2017-10-09 ENCOUNTER — Other Ambulatory Visit: Payer: Self-pay | Admitting: Neurosurgery

## 2017-10-09 DIAGNOSIS — M4310 Spondylolisthesis, site unspecified: Secondary | ICD-10-CM | POA: Diagnosis not present

## 2017-10-12 MED FILL — POTASSIUM CL ER 20 MEQ TAB: 20 | 45 days supply | Qty: 90 | Fill #1

## 2017-10-12 MED FILL — MEMANTINE HCL 10 MG TABLET: 10 | 90 days supply | Qty: 180 | Fill #2

## 2017-10-13 ENCOUNTER — Other Ambulatory Visit: Payer: Self-pay | Admitting: Family Medicine

## 2017-10-13 DIAGNOSIS — I10 Essential (primary) hypertension: Secondary | ICD-10-CM

## 2017-10-15 MED FILL — VERAPAMIL ER 240 MG TABLET: 240 | 90 days supply | Qty: 90 | Fill #0

## 2017-10-20 NOTE — Pre-Procedure Instructions (Signed)
Garrett [name]  10/20/2017      [name], [place] - Garrett [name] [place] [address] Phone: [phone] Fax: [phone]    Your procedure is scheduled on November 6  Report to Garrett [name] at [name].M.  Call this number if you have problems the morning of surgery:  [phone]   Remember:  Do not eat food or drink liquids after midnight.  Continue all other medications as directed by your physician except follow these medication instructions before surgery   Take these medicines the morning of surgery with A SIP OF WATER  albuterol (PROVENTIL) (5 MG/ML) if needed allopurinol (ZYLOPRIM) budesonide-formoterol (SYMBICORT)  memantine (NAMENDA) metoprolol tartrate (LOPRESSOR) mometasone (NASONEX) pantoprazole (PROTONIX) PROAIR HFA  topiramate (TOPAMAX)  7 days prior to surgery STOP taking any Aspirin (unless otherwise instructed by your surgeon), Aleve, Naproxen, Ibuprofen, Motrin, Advil, Goody's, BC's, all herbal medications, fish oil, and all vitamins  FOLLOW PHYSICIANS INSTRUCTIONS ABOUT  rivaroxaban (XARELTO)    Do not wear jewelry.  Do not wear lotions, powders, or cologne, or deoderant.  Men may shave face and neck.  Do not bring valuables to the hospital.  [facility] is not responsible for any belongings or valuables.  Contacts, dentures or bridgework may not be worn into surgery.  Leave your suitcase in the car.  After surgery it may be brought to your room.  For patients admitted to the hospital, discharge time will be determined by your treatment team.  Patients discharged the day of surgery will not be allowed to drive home.    Special instructions:   [facility]rett [name]- Preparing For Surgery  Before surgery, you can play an important role. Because skin is not sterile, your skin needs to be as free of germs as possible. You can reduce the number of germs on your skin by  washing with CHG (chlorahexidine gluconate) Soap before surgery.  CHG is an antiseptic cleaner which kills germs and bonds with the skin to continue killing germs even after washing.  Please do not use if you have an allergy to CHG or antibacterial soaps. If your skin becomes reddened/irritated stop using the CHG.  Do not shave (including legs and underarms) for at least 48 hours prior to first CHG shower. It is OK to shave your face.  Please follow these instructions carefully.   1. Shower the NIGHT BEFORE SURGERY and the MORNING OF SURGERY with CHG.   2. If you chose to wash your hair, wash your hair first as usual with your normal shampoo.  3. After you shampoo, rinse your hair and body thoroughly to remove the shampoo.  4. Use CHG as you would any other liquid soap. You can apply CHG directly to the skin and wash gently with a scrungie or a clean washcloth.   5. Apply the CHG Soap to your body ONLY FROM THE NECK DOWN.  Do not use on open wounds or open sores. Avoid contact with your eyes, ears, mouth and genitals (private parts). Wash Face and genitals (private parts)  with your normal soap.  6. Wash thoroughly, paying special attention to the area where your surgery will be performed.  7. Thoroughly rinse your body with warm water from the neck down.  8. DO NOT shower/wash with your normal soap after using and rinsing off the CHG Soap.  9. Pat yourself dry with a CLEAN TOWEL.  10. Wear CLEAN PAJAMAS to  bed the night before surgery, wear comfortable clothes the morning of surgery  11. Place CLEAN SHEETS on your bed the night of your first shower and DO NOT SLEEP WITH PETS.    Day of Surgery: Do not apply any deodorants/lotions. Please wear clean clothes to the hospital/surgery center.      Please read over the following fact sheets that you were given.

## 2017-10-21 ENCOUNTER — Encounter (HOSPITAL_COMMUNITY): Payer: Self-pay

## 2017-10-21 ENCOUNTER — Encounter (HOSPITAL_COMMUNITY)
Admission: RE | Admit: 2017-10-21 | Discharge: 2017-10-21 | Disposition: A | Discharge status: Home / Self Care | Payer: PPO | Source: Ambulatory Visit | Attending: Neurosurgery | Admitting: Neurosurgery

## 2017-10-21 DIAGNOSIS — M109 Gout, unspecified: Secondary | ICD-10-CM | POA: Diagnosis not present

## 2017-10-21 DIAGNOSIS — Z79899 Other long term (current) drug therapy: Secondary | ICD-10-CM | POA: Diagnosis not present

## 2017-10-21 DIAGNOSIS — E119 Type 2 diabetes mellitus without complications: Secondary | ICD-10-CM | POA: Diagnosis not present

## 2017-10-21 DIAGNOSIS — Z86711 Personal history of pulmonary embolism: Secondary | ICD-10-CM | POA: Insufficient documentation

## 2017-10-21 DIAGNOSIS — Z7982 Long term (current) use of aspirin: Secondary | ICD-10-CM | POA: Diagnosis not present

## 2017-10-21 DIAGNOSIS — J45909 Unspecified asthma, uncomplicated: Secondary | ICD-10-CM | POA: Diagnosis not present

## 2017-10-21 DIAGNOSIS — G4733 Obstructive sleep apnea (adult) (pediatric): Secondary | ICD-10-CM | POA: Diagnosis not present

## 2017-10-21 DIAGNOSIS — Z87891 Personal history of nicotine dependence: Secondary | ICD-10-CM | POA: Insufficient documentation

## 2017-10-21 DIAGNOSIS — I10 Essential (primary) hypertension: Secondary | ICD-10-CM | POA: Diagnosis not present

## 2017-10-21 DIAGNOSIS — Z9889 Other specified postprocedural states: Secondary | ICD-10-CM | POA: Diagnosis not present

## 2017-10-21 DIAGNOSIS — K219 Gastro-esophageal reflux disease without esophagitis: Secondary | ICD-10-CM | POA: Insufficient documentation

## 2017-10-21 DIAGNOSIS — Z01812 Encounter for preprocedural laboratory examination: Secondary | ICD-10-CM | POA: Insufficient documentation

## 2017-10-21 HISTORY — DX: Heart failure, unspecified: I50.9

## 2017-10-21 HISTORY — DX: Personal history of pulmonary embolism: Z86.711

## 2017-10-21 HISTORY — DX: Cardiac murmur, unspecified: R01.1

## 2017-10-21 HISTORY — DX: Personal history of other diseases of the digestive system: Z87.19

## 2017-10-21 HISTORY — DX: Pneumonia, unspecified organism: J18.9

## 2017-10-21 HISTORY — DX: Dyspnea, unspecified: R06.00

## 2017-10-21 LAB — GLUCOSE, CAPILLARY: Glucose-Capillary: 164 mg/dL — ABNORMAL HIGH (ref 65–99)

## 2017-10-21 LAB — BASIC METABOLIC PANEL
ANION GAP: 10 (ref 5–15)
BUN: 14 mg/dL (ref 6–20)
CHLORIDE: 110 mmol/L (ref 101–111)
CO2: 20 mmol/L — ABNORMAL LOW (ref 22–32)
Calcium: 9.4 mg/dL (ref 8.9–10.3)
Creatinine, Ser: 1.14 mg/dL (ref 0.61–1.24)
Glucose, Bld: 169 mg/dL — ABNORMAL HIGH (ref 65–99)
POTASSIUM: 3.7 mmol/L (ref 3.5–5.1)
SODIUM: 140 mmol/L (ref 135–145)

## 2017-10-21 LAB — TYPE AND SCREEN
ABO/RH(D): A POS
ANTIBODY SCREEN: NEGATIVE

## 2017-10-21 LAB — CBC WITH DIFFERENTIAL/PLATELET
BASOS PCT: 0 %
Basophils Absolute: 0 10*3/uL (ref 0.0–0.1)
EOS ABS: 0.2 10*3/uL (ref 0.0–0.7)
EOS PCT: 3 %
HCT: 40.4 % (ref 39.0–52.0)
Hemoglobin: 13 g/dL (ref 13.0–17.0)
LYMPHS ABS: 1.9 10*3/uL (ref 0.7–4.0)
Lymphocytes Relative: 25 %
MCH: 29.7 pg (ref 26.0–34.0)
MCHC: 32.2 g/dL (ref 30.0–36.0)
MCV: 92.2 fL (ref 78.0–100.0)
MONOS PCT: 7 %
Monocytes Absolute: 0.5 10*3/uL (ref 0.1–1.0)
NEUTROS PCT: 65 %
Neutro Abs: 4.9 10*3/uL (ref 1.7–7.7)
PLATELETS: 198 10*3/uL (ref 150–400)
RBC: 4.38 MIL/uL (ref 4.22–5.81)
RDW: 14.4 % (ref 11.5–15.5)
WBC: 7.5 10*3/uL (ref 4.0–10.5)

## 2017-10-21 LAB — HEMOGLOBIN A1C
Hgb A1c MFr Bld: 6.1 % — ABNORMAL HIGH (ref 4.8–5.6)
Mean Plasma Glucose: 128.37 mg/dL

## 2017-10-21 LAB — SURGICAL PCR SCREEN
MRSA, PCR: NEGATIVE
STAPHYLOCOCCUS AUREUS: NEGATIVE

## 2017-10-21 NOTE — Pre-Procedure Instructions (Signed)
Garrett [name]  10/21/2017      [place], [place] - [place] [address] Phone: [phone] Fax: [phone]    Your procedure is scheduled on November 6  Report to [place] at [place].M.  Call this number if you have problems the morning of surgery:  [phone]   Remember:  Do not eat food or drink liquids after midnight.  Continue all other medications as directed by your physician except follow these medication instructions before surgery   Take these medicines the morning of surgery with A SIP OF WATER  albuterol (PROVENTIL) (5 MG/ML) if needed allopurinol (ZYLOPRIM) budesonide-formoterol (SYMBICORT)  memantine (NAMENDA) metoprolol tartrate (LOPRESSOR) mometasone (NASONEX) pantoprazole (PROTONIX) PROAIR HFA  topiramate (TOPAMAX)  7 days prior to surgery STOP taking any Aspirin (unless otherwise instructed by your surgeon), Aleve, Naproxen, Ibuprofen, Motrin, Advil, Goody's, BC's, all herbal medications, fish oil, and all vitamins, Celebrex  FOLLOW PHYSICIANS INSTRUCTIONS ABOUT  rivaroxaban (XARELTO)    How to Manage Your Diabetes Before and After Surgery  Why is it important to control my blood sugar before and after surgery? . Improving blood sugar levels before and after surgery helps healing and can limit problems. . A way of improving blood sugar control is eating a healthy diet by: o  Eating less sugar and carbohydrates o  Increasing activity/exercise o  Talking with your doctor about reaching your blood sugar goals . High blood sugars (greater than 180 mg/dL) can raise your risk of infections and slow your recovery, so you will need to focus on controlling your diabetes during the weeks before surgery. . Make sure that the doctor who takes care of your diabetes knows about your planned surgery including the date and location.  How do I manage my blood  sugar before surgery? . Check your blood sugar at least 4 times a day, starting 2 days before surgery, to make sure that the level is not too high or low. o Check your blood sugar the morning of your surgery when you wake up and every 2 hours until you get to the Short Stay unit. . If your blood sugar is less than 70 mg/dL, you will need to treat for low blood sugar: o Do not take insulin. o Treat a low blood sugar (less than 70 mg/dL) with  cup of clear juice (cranberry or apple), 4 glucose tablets, OR glucose gel. Recheck blood sugar in 15 minutes after treatment (to make sure it is greater than 70 mg/dL). If your blood sugar is not greater than 70 mg/dL on recheck, call [phone] o  for further instructions. . Report your blood sugar to the short stay nurse when you get to Short Stay.  . If you are admitted to the hospital after surgery: o Your blood sugar will be checked by the staff and you will probably be given insulin after surgery (instead of oral diabetes medicines) to make sure you have good blood sugar levels. o The goal for blood sugar control after surgery is 80-180 mg/dL.              WHAT DO I DO ABOUT MY DIABETES MEDICATION?   Marland Kitchen Do not take oral diabetes medicines (pills) the morning of surgery.  Metformin (Glucophage)      . The day of surgery, do not take other diabetes injectables, including Byetta (exenatide), Bydureon (exenatide ER), Victoza (liraglutide), or Trulicity (dulaglutide).  Marland Kitchen  If your CBG is greater than 220 mg/dL, you may take  of your sliding scale (correction) dose of insulin.  Other Instructions:          Patient Signature:  Date:   Nurse Signature:  Date:   Reviewed and Endorsed by [facility] Patient Education Committee, August 2015   Do not wear jewelry.  Do not wear lotions, powders, or cologne, or deoderant.  Men may shave face and neck.  Do not bring valuables to the hospital.  [facility] is not responsible for any  belongings or valuables.  Contacts, dentures or bridgework may not be worn into surgery.  Leave your suitcase in the car.  After surgery it may be brought to your room.  For patients admitted to the hospital, discharge time will be determined by your treatment team.  Patients discharged the day of surgery will not be allowed to drive home.    Special instructions:   [facility]- Preparing For Surgery  Before surgery, you can play an important role. Because skin is not sterile, your skin needs to be as free of germs as possible. You can reduce the number of germs on your skin by washing with CHG (chlorahexidine gluconate) Soap before surgery.  CHG is an antiseptic cleaner which kills germs and bonds with the skin to continue killing germs even after washing.  Please do not use if you have an allergy to CHG or antibacterial soaps. If your skin becomes reddened/irritated stop using the CHG.  Do not shave (including legs and underarms) for at least 48 hours prior to first CHG shower. It is OK to shave your face.  Please follow these instructions carefully.   1. Shower the NIGHT BEFORE SURGERY and the MORNING OF SURGERY with CHG.   2. If you chose to wash your hair, wash your hair first as usual with your normal shampoo.  3. After you shampoo, rinse your hair and body thoroughly to remove the shampoo.  4. Use CHG as you would any other liquid soap. You can apply CHG directly to the skin and wash gently with a scrungie or a clean washcloth.   5. Apply the CHG Soap to your body ONLY FROM THE NECK DOWN.  Do not use on open wounds or open sores. Avoid contact with your eyes, ears, mouth and genitals (private parts). Wash Face and genitals (private parts)  with your normal soap.  6. Wash thoroughly, paying special attention to the area where your surgery will be performed.  7. Thoroughly rinse your body with warm water from the neck down.  8. DO NOT shower/wash with your normal soap after  using and rinsing off the CHG Soap.  9. Pat yourself dry with a CLEAN TOWEL.  10. Wear CLEAN PAJAMAS to bed the night before surgery, wear comfortable clothes the morning of surgery  11. Place CLEAN SHEETS on your bed the night of your first shower and DO NOT SLEEP WITH PETS.    Day of Surgery: Do not apply any deodorants/lotions. Please wear clean clothes to the hospital/surgery center.      Please read over the following fact sheets that you were given.

## 2017-10-21 NOTE — Progress Notes (Signed)
PCP is Dr. Etter Sjogren Pulmonary is Dr Melvyn Novas States he saw Dr Maple Hudson years ago States he sees Dr Marin Olp" for his blood" Denies nay chest pain, fever, or cough. Repors fasting CBGs run 130's-140's Instructed to bring CPAP mask on the day of surgery. Last dose of ASA will be 10-22-17 Last dose of Xarelto will be 10-25-17 Echo noted 2018 Stress test noted 2014 Card cath 1994
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
## 2017-10-26 MED FILL — PANTOPRAZOLE SOD DR 40 MG T: 40 | 90 days supply | Qty: 180 | Fill #1

## 2017-10-27 ENCOUNTER — Telehealth: Payer: Self-pay | Admitting: Family Medicine

## 2017-10-27 DIAGNOSIS — R059 Cough, unspecified: Secondary | ICD-10-CM

## 2017-10-27 DIAGNOSIS — R05 Cough: Secondary | ICD-10-CM

## 2017-10-27 MED FILL — TERAZOSIN 5 MG CAPSULE: 5 | 30 days supply | Qty: 30 | Fill #0

## 2017-10-28 ENCOUNTER — Inpatient Hospital Stay (HOSPITAL_COMMUNITY): Payer: PPO | Admitting: Certified Registered"

## 2017-10-28 ENCOUNTER — Encounter (HOSPITAL_COMMUNITY)
Admission: RE | Disposition: A | Discharge status: Home / Self Care | Payer: Self-pay | Source: Ambulatory Visit | Attending: Neurosurgery

## 2017-10-28 ENCOUNTER — Inpatient Hospital Stay (HOSPITAL_COMMUNITY): Payer: PPO

## 2017-10-28 ENCOUNTER — Inpatient Hospital Stay (HOSPITAL_COMMUNITY): Payer: PPO | Admitting: Vascular Surgery

## 2017-10-28 ENCOUNTER — Inpatient Hospital Stay (HOSPITAL_COMMUNITY)
Admission: RE | Admit: 2017-10-28 | Discharge: 2017-10-29 | DRG: 454 | Disposition: A | Discharge status: Home / Self Care | Payer: PPO | Source: Ambulatory Visit | Attending: Neurosurgery | Admitting: Neurosurgery

## 2017-10-28 ENCOUNTER — Encounter (HOSPITAL_COMMUNITY): Payer: Self-pay | Admitting: General Practice

## 2017-10-28 DIAGNOSIS — X58XXXA Exposure to other specified factors, initial encounter: Secondary | ICD-10-CM | POA: Diagnosis present

## 2017-10-28 DIAGNOSIS — M48061 Spinal stenosis, lumbar region without neurogenic claudication: Principal | ICD-10-CM | POA: Diagnosis present

## 2017-10-28 DIAGNOSIS — M4316 Spondylolisthesis, lumbar region: Secondary | ICD-10-CM | POA: Diagnosis not present

## 2017-10-28 DIAGNOSIS — Z87891 Personal history of nicotine dependence: Secondary | ICD-10-CM

## 2017-10-28 DIAGNOSIS — Z79899 Other long term (current) drug therapy: Secondary | ICD-10-CM | POA: Diagnosis not present

## 2017-10-28 DIAGNOSIS — Z6834 Body mass index (BMI) 34.0-34.9, adult: Secondary | ICD-10-CM | POA: Diagnosis not present

## 2017-10-28 DIAGNOSIS — Z791 Long term (current) use of non-steroidal anti-inflammatories (NSAID): Secondary | ICD-10-CM

## 2017-10-28 DIAGNOSIS — G4730 Sleep apnea, unspecified: Secondary | ICD-10-CM | POA: Diagnosis not present

## 2017-10-28 DIAGNOSIS — R402414 Glasgow coma scale score 13-15, 24 hours or more after hospital admission: Secondary | ICD-10-CM | POA: Diagnosis not present

## 2017-10-28 DIAGNOSIS — M109 Gout, unspecified: Secondary | ICD-10-CM | POA: Diagnosis not present

## 2017-10-28 DIAGNOSIS — Z7901 Long term (current) use of anticoagulants: Secondary | ICD-10-CM

## 2017-10-28 DIAGNOSIS — Z91018 Allergy to other foods: Secondary | ICD-10-CM

## 2017-10-28 DIAGNOSIS — J45909 Unspecified asthma, uncomplicated: Secondary | ICD-10-CM | POA: Diagnosis present

## 2017-10-28 DIAGNOSIS — E1142 Type 2 diabetes mellitus with diabetic polyneuropathy: Secondary | ICD-10-CM | POA: Diagnosis not present

## 2017-10-28 DIAGNOSIS — H9190 Unspecified hearing loss, unspecified ear: Secondary | ICD-10-CM | POA: Diagnosis not present

## 2017-10-28 DIAGNOSIS — G43909 Migraine, unspecified, not intractable, without status migrainosus: Secondary | ICD-10-CM | POA: Diagnosis not present

## 2017-10-28 DIAGNOSIS — S32039A Unspecified fracture of third lumbar vertebra, initial encounter for closed fracture: Secondary | ICD-10-CM | POA: Diagnosis present

## 2017-10-28 DIAGNOSIS — M4326 Fusion of spine, lumbar region: Secondary | ICD-10-CM | POA: Diagnosis not present

## 2017-10-28 DIAGNOSIS — M1990 Unspecified osteoarthritis, unspecified site: Secondary | ICD-10-CM | POA: Diagnosis present

## 2017-10-28 DIAGNOSIS — K219 Gastro-esophageal reflux disease without esophagitis: Secondary | ICD-10-CM | POA: Diagnosis present

## 2017-10-28 DIAGNOSIS — I110 Hypertensive heart disease with heart failure: Secondary | ICD-10-CM | POA: Diagnosis present

## 2017-10-28 DIAGNOSIS — M549 Dorsalgia, unspecified: Secondary | ICD-10-CM | POA: Diagnosis present

## 2017-10-28 DIAGNOSIS — Z7982 Long term (current) use of aspirin: Secondary | ICD-10-CM | POA: Diagnosis not present

## 2017-10-28 DIAGNOSIS — Z7951 Long term (current) use of inhaled steroids: Secondary | ICD-10-CM | POA: Diagnosis not present

## 2017-10-28 DIAGNOSIS — I509 Heart failure, unspecified: Secondary | ICD-10-CM | POA: Diagnosis not present

## 2017-10-28 DIAGNOSIS — Z981 Arthrodesis status: Secondary | ICD-10-CM

## 2017-10-28 DIAGNOSIS — Z7984 Long term (current) use of oral hypoglycemic drugs: Secondary | ICD-10-CM

## 2017-10-28 DIAGNOSIS — Z9989 Dependence on other enabling machines and devices: Secondary | ICD-10-CM

## 2017-10-28 DIAGNOSIS — M48062 Spinal stenosis, lumbar region with neurogenic claudication: Secondary | ICD-10-CM | POA: Diagnosis not present

## 2017-10-28 DIAGNOSIS — E785 Hyperlipidemia, unspecified: Secondary | ICD-10-CM | POA: Diagnosis not present

## 2017-10-28 DIAGNOSIS — J449 Chronic obstructive pulmonary disease, unspecified: Secondary | ICD-10-CM | POA: Diagnosis not present

## 2017-10-28 DIAGNOSIS — Z419 Encounter for procedure for purposes other than remedying health state, unspecified: Secondary | ICD-10-CM

## 2017-10-28 DIAGNOSIS — Z91030 Bee allergy status: Secondary | ICD-10-CM

## 2017-10-28 LAB — GLUCOSE, CAPILLARY
GLUCOSE-CAPILLARY: 110 mg/dL — AB (ref 65–99)
GLUCOSE-CAPILLARY: 145 mg/dL — AB (ref 65–99)
Glucose-Capillary: 230 mg/dL — ABNORMAL HIGH (ref 65–99)

## 2017-10-28 LAB — PROTIME-INR
INR: 0.99
PROTHROMBIN TIME: 13 s (ref 11.4–15.2)

## 2017-10-28 SURGERY — Surgical Case
Anesthesia: *Unknown

## 2017-10-28 SURGERY — POSTERIOR LUMBAR FUSION 1 WITH HARDWARE REMOVAL
Anesthesia: General | Site: Back

## 2017-10-28 MED ORDER — ONDANSETRON HCL 4 MG/2ML IJ SOLN: 4.0000 mg | Freq: Four times a day (QID) | INTRAMUSCULAR | Status: DC | PRN

## 2017-10-28 MED ORDER — THROMBIN (RECOMBINANT) 20000 UNITS EX SOLR
CUTANEOUS | Status: AC
  Filled 2017-10-28: qty 20000

## 2017-10-28 MED ORDER — GABAPENTIN 300 MG PO CAPS
300.0000 mg | ORAL_CAPSULE | Freq: Every day | ORAL | Status: DC
  Administered 2017-10-28: 300 mg via ORAL
  Filled 2017-10-28: qty 1

## 2017-10-28 MED ORDER — PHENYLEPHRINE HCL 10 MG/ML IJ SOLN
INTRAVENOUS | Status: DC | PRN
  Administered 2017-10-28: 20 ug/min via INTRAVENOUS

## 2017-10-28 MED ORDER — SODIUM CHLORIDE 0.9 % IV SOLN: 250.0000 mL | INTRAVENOUS | Status: DC

## 2017-10-28 MED ORDER — PROMETHAZINE HCL 25 MG/ML IJ SOLN: 6.2500 mg | INTRAMUSCULAR | Status: DC | PRN

## 2017-10-28 MED ORDER — HYDROMORPHONE HCL 1 MG/ML IJ SOLN
INTRAMUSCULAR | Status: AC
  Filled 2017-10-28: qty 1

## 2017-10-28 MED ORDER — MAGNESIUM OXIDE 400 (241.3 MG) MG PO TABS
400.0000 mg | ORAL_TABLET | Freq: Every day | ORAL | Status: DC
  Administered 2017-10-29: 400 mg via ORAL
  Filled 2017-10-28: qty 1

## 2017-10-28 MED ORDER — CEFAZOLIN SODIUM-DEXTROSE 1-4 GM/50ML-% IV SOLN
1.0000 g | Freq: Three times a day (TID) | INTRAVENOUS | Status: AC
  Administered 2017-10-29: 1 g via INTRAVENOUS
  Filled 2017-10-28: qty 50

## 2017-10-28 MED ORDER — OXYCODONE HCL 5 MG PO TABS
10.0000 mg | ORAL_TABLET | ORAL | Status: DC | PRN
  Administered 2017-10-28 – 2017-10-29 (×4): 10 mg via ORAL
  Filled 2017-10-28 (×3): qty 2

## 2017-10-28 MED ORDER — SUGAMMADEX SODIUM 200 MG/2ML IV SOLN
INTRAVENOUS | Status: AC
  Filled 2017-10-28: qty 2

## 2017-10-28 MED ORDER — ALLOPURINOL 100 MG PO TABS
100.0000 mg | ORAL_TABLET | Freq: Every day | ORAL | Status: DC
  Administered 2017-10-29: 100 mg via ORAL
  Filled 2017-10-28 (×2): qty 1

## 2017-10-28 MED ORDER — MIDAZOLAM HCL 2 MG/2ML IJ SOLN
INTRAMUSCULAR | Status: AC
  Filled 2017-10-28: qty 2

## 2017-10-28 MED ORDER — DIAZEPAM 5 MG PO TABS
5.0000 mg | ORAL_TABLET | Freq: Four times a day (QID) | ORAL | Status: DC | PRN
  Administered 2017-10-28: 5 mg via ORAL
  Administered 2017-10-28: 10 mg via ORAL
  Administered 2017-10-29: 5 mg via ORAL
  Filled 2017-10-28: qty 1
  Filled 2017-10-28: qty 2

## 2017-10-28 MED ORDER — METOPROLOL TARTRATE 25 MG PO TABS
25.0000 mg | ORAL_TABLET | Freq: Every day | ORAL | Status: DC
Start: 2017-10-29 — End: 2017-10-29
  Administered 2017-10-29: 25 mg via ORAL
  Filled 2017-10-28: qty 1

## 2017-10-28 MED ORDER — TOPIRAMATE 25 MG PO TABS
25.0000 mg | ORAL_TABLET | Freq: Three times a day (TID) | ORAL | Status: DC
  Administered 2017-10-28 – 2017-10-29 (×2): 25 mg via ORAL
  Filled 2017-10-28 (×3): qty 1

## 2017-10-28 MED ORDER — SODIUM CHLORIDE 0.9% FLUSH: 3.0000 mL | INTRAVENOUS | Status: DC | PRN

## 2017-10-28 MED ORDER — MIDAZOLAM HCL 2 MG/2ML IJ SOLN
INTRAMUSCULAR | Status: DC | PRN
  Administered 2017-10-28: 2 mg via INTRAVENOUS

## 2017-10-28 MED ORDER — CYANOCOBALAMIN 500 MCG PO TABS
500.0000 ug | ORAL_TABLET | Freq: Every day | ORAL | Status: DC
  Administered 2017-10-29: 500 ug via ORAL
  Filled 2017-10-28 (×2): qty 1

## 2017-10-28 MED ORDER — SODIUM CHLORIDE 0.9% FLUSH
3.0000 mL | Freq: Two times a day (BID) | INTRAVENOUS | Status: DC
Start: 2017-10-28 — End: 2017-10-29

## 2017-10-28 MED ORDER — BUPIVACAINE HCL (PF) 0.25 % IJ SOLN
INTRAMUSCULAR | Status: AC
  Filled 2017-10-28: qty 30

## 2017-10-28 MED ORDER — PANTOPRAZOLE SODIUM 40 MG PO TBEC
40.0000 mg | DELAYED_RELEASE_TABLET | Freq: Two times a day (BID) | ORAL | Status: DC
  Administered 2017-10-28 – 2017-10-29 (×2): 40 mg via ORAL
  Filled 2017-10-28 (×2): qty 1

## 2017-10-28 MED ORDER — SODIUM CHLORIDE 0.9 % IV SOLN
INTRAVENOUS | Status: DC | PRN
  Administered 2017-10-28: 16:00:00 via INTRAVENOUS

## 2017-10-28 MED ORDER — POLYETHYLENE GLYCOL 3350 17 G PO PACK: 17.0000 g | PACK | Freq: Every day | ORAL | Status: DC | PRN

## 2017-10-28 MED ORDER — SUGAMMADEX SODIUM 200 MG/2ML IV SOLN
INTRAVENOUS | Status: DC | PRN
  Administered 2017-10-28: 200 mg via INTRAVENOUS

## 2017-10-28 MED ORDER — VERAPAMIL HCL ER 240 MG PO TBCR
240.0000 mg | EXTENDED_RELEASE_TABLET | Freq: Every day | ORAL | Status: DC
  Administered 2017-10-28: 240 mg via ORAL
  Filled 2017-10-28 (×2): qty 1

## 2017-10-28 MED ORDER — FUROSEMIDE 20 MG PO TABS
20.0000 mg | ORAL_TABLET | Freq: Every day | ORAL | Status: DC
  Administered 2017-10-29: 20 mg via ORAL
  Filled 2017-10-28: qty 1

## 2017-10-28 MED ORDER — CHLORHEXIDINE GLUCONATE CLOTH 2 % EX PADS: 6.0000 | MEDICATED_PAD | Freq: Once | CUTANEOUS | Status: DC

## 2017-10-28 MED ORDER — HYDROMORPHONE HCL 1 MG/ML IJ SOLN: 1.0000 mg | INTRAMUSCULAR | Status: DC | PRN

## 2017-10-28 MED ORDER — 0.9 % SODIUM CHLORIDE (POUR BTL) OPTIME
TOPICAL | Status: DC | PRN
  Administered 2017-10-28: 1000 mL

## 2017-10-28 MED ORDER — DIAZEPAM 5 MG PO TABS
ORAL_TABLET | ORAL | Status: AC
Start: 2017-10-28 — End: 2017-10-29
  Filled 2017-10-28: qty 1

## 2017-10-28 MED ORDER — HYDROMORPHONE HCL 1 MG/ML IJ SOLN
0.2500 mg | INTRAMUSCULAR | Status: DC | PRN
  Administered 2017-10-28 (×2): 0.5 mg via INTRAVENOUS

## 2017-10-28 MED ORDER — LACTATED RINGERS IV SOLN
INTRAVENOUS | Status: DC
  Administered 2017-10-28: 12:00:00 via INTRAVENOUS

## 2017-10-28 MED ORDER — EPHEDRINE SULFATE-NACL 50-0.9 MG/10ML-% IV SOSY
PREFILLED_SYRINGE | INTRAVENOUS | Status: DC | PRN
  Administered 2017-10-28 (×2): 5 mg via INTRAVENOUS
  Administered 2017-10-28: 10 mg via INTRAVENOUS
  Administered 2017-10-28: 5 mg via INTRAVENOUS

## 2017-10-28 MED ORDER — MOMETASONE FURO-FORMOTEROL FUM 100-5 MCG/ACT IN AERO
2.0000 | INHALATION_SPRAY | Freq: Two times a day (BID) | RESPIRATORY_TRACT | Status: DC
  Administered 2017-10-28: 2 via RESPIRATORY_TRACT
  Filled 2017-10-28 (×2): qty 8.8

## 2017-10-28 MED ORDER — FENTANYL CITRATE (PF) 250 MCG/5ML IJ SOLN
INTRAMUSCULAR | Status: DC | PRN
Start: 2017-10-28 — End: 2017-10-28
  Administered 2017-10-28: 50 ug via INTRAVENOUS
  Administered 2017-10-28: 250 ug via INTRAVENOUS
  Administered 2017-10-28 (×4): 50 ug via INTRAVENOUS

## 2017-10-28 MED ORDER — FENTANYL CITRATE (PF) 250 MCG/5ML IJ SOLN
INTRAMUSCULAR | Status: AC
  Filled 2017-10-28: qty 5

## 2017-10-28 MED ORDER — HYDROCODONE-ACETAMINOPHEN 10-325 MG PO TABS
1.0000 | ORAL_TABLET | ORAL | Status: DC | PRN
  Administered 2017-10-28: 1 via ORAL
  Filled 2017-10-28 (×2): qty 1

## 2017-10-28 MED ORDER — METFORMIN HCL 500 MG PO TABS
500.0000 mg | ORAL_TABLET | Freq: Two times a day (BID) | ORAL | Status: DC
  Administered 2017-10-29: 500 mg via ORAL
  Filled 2017-10-28: qty 1

## 2017-10-28 MED ORDER — LIDOCAINE 2% (20 MG/ML) 5 ML SYRINGE
INTRAMUSCULAR | Status: DC | PRN
  Administered 2017-10-28: 20 mg via INTRAVENOUS

## 2017-10-28 MED ORDER — ACETAMINOPHEN 650 MG RE SUPP: 650.0000 mg | RECTAL | Status: DC | PRN

## 2017-10-28 MED ORDER — VITAMIN D3 25 MCG (1000 UNIT) PO TABS
1000.0000 [IU] | ORAL_TABLET | Freq: Every day | ORAL | Status: DC
  Administered 2017-10-29: 1000 [IU] via ORAL
  Filled 2017-10-28 (×3): qty 1

## 2017-10-28 MED ORDER — ONDANSETRON HCL 4 MG/2ML IJ SOLN
INTRAMUSCULAR | Status: DC | PRN
  Administered 2017-10-28: 4 mg via INTRAVENOUS

## 2017-10-28 MED ORDER — MEMANTINE HCL 10 MG PO TABS
20.0000 mg | ORAL_TABLET | Freq: Every day | ORAL | Status: DC
  Administered 2017-10-29: 20 mg via ORAL
  Filled 2017-10-28: qty 2

## 2017-10-28 MED ORDER — ALBUTEROL SULFATE HFA 108 (90 BASE) MCG/ACT IN AERS: 1.0000 | INHALATION_SPRAY | Freq: Four times a day (QID) | RESPIRATORY_TRACT | Status: DC | PRN

## 2017-10-28 MED ORDER — OXYCODONE HCL 5 MG PO TABS
ORAL_TABLET | ORAL | Status: AC
  Filled 2017-10-28: qty 2

## 2017-10-28 MED ORDER — ADULT MULTIVITAMIN W/MINERALS CH
ORAL_TABLET | Freq: Every day | ORAL | Status: DC
  Administered 2017-10-29: 1 via ORAL
  Filled 2017-10-28: qty 100
  Filled 2017-10-28: qty 1

## 2017-10-28 MED ORDER — LACTATED RINGERS IV SOLN
INTRAVENOUS | Status: DC | PRN
  Administered 2017-10-28 (×2): via INTRAVENOUS

## 2017-10-28 MED ORDER — ONDANSETRON HCL 4 MG PO TABS: 4.0000 mg | ORAL_TABLET | Freq: Four times a day (QID) | ORAL | Status: DC | PRN

## 2017-10-28 MED ORDER — MIDAZOLAM HCL 2 MG/2ML IJ SOLN: 0.5000 mg | Freq: Once | INTRAMUSCULAR | Status: DC | PRN

## 2017-10-28 MED ORDER — VANCOMYCIN HCL 1000 MG IV SOLR
INTRAVENOUS | Status: AC
  Filled 2017-10-28: qty 1000

## 2017-10-28 MED ORDER — PHENOL 1.4 % MT LIQD: 1.0000 | OROMUCOSAL | Status: DC | PRN

## 2017-10-28 MED ORDER — ACETAMINOPHEN 325 MG PO TABS
650.0000 mg | ORAL_TABLET | ORAL | Status: DC | PRN
Start: 2017-10-28 — End: 2017-10-29

## 2017-10-28 MED ORDER — ROCURONIUM BROMIDE 10 MG/ML (PF) SYRINGE
PREFILLED_SYRINGE | INTRAVENOUS | Status: AC
  Filled 2017-10-28: qty 5

## 2017-10-28 MED ORDER — VANCOMYCIN HCL 1000 MG IV SOLR
INTRAVENOUS | Status: DC | PRN
  Administered 2017-10-28: 1000 mg

## 2017-10-28 MED ORDER — TERAZOSIN HCL 5 MG PO CAPS
5.0000 mg | ORAL_CAPSULE | Freq: Every day | ORAL | Status: DC
  Administered 2017-10-28: 5 mg via ORAL
  Filled 2017-10-28: qty 1

## 2017-10-28 MED ORDER — RISAQUAD PO CAPS
1.0000 | ORAL_CAPSULE | Freq: Every day | ORAL | Status: DC
  Administered 2017-10-28: 1 via ORAL
  Filled 2017-10-28: qty 1

## 2017-10-28 MED ORDER — BISACODYL 10 MG RE SUPP: 10.0000 mg | Freq: Every day | RECTAL | Status: DC | PRN

## 2017-10-28 MED ORDER — DEXAMETHASONE SODIUM PHOSPHATE 10 MG/ML IJ SOLN
INTRAMUSCULAR | Status: AC
  Filled 2017-10-28: qty 1

## 2017-10-28 MED ORDER — POTASSIUM CHLORIDE CRYS ER 20 MEQ PO TBCR
40.0000 meq | EXTENDED_RELEASE_TABLET | Freq: Every day | ORAL | Status: DC
  Administered 2017-10-29: 40 meq via ORAL
  Filled 2017-10-28: qty 2

## 2017-10-28 MED ORDER — ROCURONIUM BROMIDE 10 MG/ML (PF) SYRINGE
PREFILLED_SYRINGE | INTRAVENOUS | Status: DC | PRN
  Administered 2017-10-28 (×2): 50 mg via INTRAVENOUS

## 2017-10-28 MED ORDER — CEFAZOLIN SODIUM-DEXTROSE 2-4 GM/100ML-% IV SOLN
2.0000 g | INTRAVENOUS | Status: AC
  Administered 2017-10-28: 2 g via INTRAVENOUS

## 2017-10-28 MED ORDER — MENTHOL 3 MG MT LOZG: 1.0000 | LOZENGE | OROMUCOSAL | Status: DC | PRN

## 2017-10-28 MED ORDER — THROMBIN (RECOMBINANT) 20000 UNITS EX SOLR
CUTANEOUS | Status: DC | PRN
  Administered 2017-10-28: 20 mL via TOPICAL

## 2017-10-28 MED ORDER — LEVOCETIRIZINE DIHYDROCHLORIDE 5 MG PO TABS: 5.0000 mg | ORAL_TABLET | Freq: Every evening | ORAL | Status: DC

## 2017-10-28 MED ORDER — LORATADINE 10 MG PO TABS
10.0000 mg | ORAL_TABLET | Freq: Every day | ORAL | Status: DC
  Administered 2017-10-29: 10 mg via ORAL
  Filled 2017-10-28: qty 1

## 2017-10-28 MED ORDER — LIDOCAINE 2% (20 MG/ML) 5 ML SYRINGE
INTRAMUSCULAR | Status: AC
  Filled 2017-10-28: qty 5

## 2017-10-28 MED ORDER — BUPIVACAINE HCL (PF) 0.25 % IJ SOLN
INTRAMUSCULAR | Status: DC | PRN
  Administered 2017-10-28: 30 mL

## 2017-10-28 MED ORDER — FLEET ENEMA 7-19 GM/118ML RE ENEM: 1.0000 | ENEMA | Freq: Once | RECTAL | Status: DC | PRN

## 2017-10-28 MED ORDER — SODIUM CHLORIDE 0.9 % IR SOLN
Status: DC | PRN
  Administered 2017-10-28: 500 mL

## 2017-10-28 MED ORDER — GLYCOPYRROLATE 0.2 MG/ML IV SOSY
PREFILLED_SYRINGE | INTRAVENOUS | Status: DC | PRN
  Administered 2017-10-28: .1 mg via INTRAVENOUS

## 2017-10-28 MED ORDER — ONDANSETRON HCL 4 MG/2ML IJ SOLN
INTRAMUSCULAR | Status: AC
  Filled 2017-10-28: qty 2

## 2017-10-28 MED ORDER — ALBUTEROL SULFATE (2.5 MG/3ML) 0.083% IN NEBU
2.5000 mg | INHALATION_SOLUTION | RESPIRATORY_TRACT | Status: DC | PRN
  Filled 2017-10-28: qty 3

## 2017-10-28 MED ORDER — PRAVASTATIN SODIUM 10 MG PO TABS
10.0000 mg | ORAL_TABLET | Freq: Every day | ORAL | Status: DC
  Administered 2017-10-29: 10 mg via ORAL
  Filled 2017-10-28: qty 1

## 2017-10-28 MED ORDER — CEFAZOLIN SODIUM-DEXTROSE 2-4 GM/100ML-% IV SOLN
INTRAVENOUS | Status: AC
  Filled 2017-10-28: qty 100

## 2017-10-28 MED ORDER — DEXAMETHASONE SODIUM PHOSPHATE 10 MG/ML IJ SOLN
10.0000 mg | INTRAMUSCULAR | Status: AC
  Administered 2017-10-28: 10 mg via INTRAVENOUS

## 2017-10-28 MED ORDER — FLUTICASONE PROPIONATE 50 MCG/ACT NA SUSP
1.0000 | Freq: Every day | NASAL | Status: DC
  Filled 2017-10-28: qty 16

## 2017-10-28 MED ORDER — ALBUTEROL SULFATE (2.5 MG/3ML) 0.083% IN NEBU: 2.5000 mg | INHALATION_SOLUTION | Freq: Four times a day (QID) | RESPIRATORY_TRACT | Status: DC | PRN

## 2017-10-28 MED ORDER — PROPOFOL 10 MG/ML IV BOLUS
INTRAVENOUS | Status: DC | PRN
  Administered 2017-10-28: 100 mg via INTRAVENOUS

## 2017-10-28 MED ORDER — MEPERIDINE HCL 25 MG/ML IJ SOLN: 6.2500 mg | INTRAMUSCULAR | Status: DC | PRN

## 2017-10-28 MED FILL — LEVOCETIRIZINE 5 MG TABLET: 5 | 30 days supply | Qty: 30 | Fill #0

## 2017-10-28 SURGICAL SUPPLY — 58 items
BAG DECANTER FOR FLEXI CONT (MISCELLANEOUS) ×2 IMPLANT
BENZOIN TINCTURE PRP APPL 2/3 (GAUZE/BANDAGES/DRESSINGS) ×2 IMPLANT
BLADE CLIPPER SURG (BLADE) ×2 IMPLANT
BUR CUTTER 7.0 ROUND (BURR) ×2 IMPLANT
BUR MATCHSTICK NEURO 3.0 LAGG (BURR) ×2 IMPLANT
CANISTER SUCT 3000ML PPV (MISCELLANEOUS) ×2 IMPLANT
CAP LCK SPNE (Orthopedic Implant) ×4 IMPLANT
CAP LOCK SPINE RADIUS (Orthopedic Implant) ×4 IMPLANT
CAP LOCKING (Orthopedic Implant) ×4 IMPLANT
CARTRIDGE OIL MAESTRO DRILL (MISCELLANEOUS) ×1 IMPLANT
CONT SPEC 4OZ CLIKSEAL STRL BL (MISCELLANEOUS) ×2 IMPLANT
COVER BACK TABLE 60X90IN (DRAPES) ×2 IMPLANT
DECANTER SPIKE VIAL GLASS SM (MISCELLANEOUS) ×2 IMPLANT
DERMABOND ADVANCED (GAUZE/BANDAGES/DRESSINGS) ×2
DERMABOND ADVANCED .7 DNX12 (GAUZE/BANDAGES/DRESSINGS) ×2 IMPLANT
DEVICE INTERBODY ELEVATE 9X23 (Cage) ×4 IMPLANT
DIFFUSER DRILL AIR PNEUMATIC (MISCELLANEOUS) ×2 IMPLANT
DRAPE C-ARM 42X72 X-RAY (DRAPES) ×4 IMPLANT
DRAPE HALF SHEET 40X57 (DRAPES) ×2 IMPLANT
DRAPE LAPAROTOMY 100X72X124 (DRAPES) ×2 IMPLANT
DRAPE POUCH INSTRU U-SHP 10X18 (DRAPES) ×2 IMPLANT
DRAPE SURG 17X23 STRL (DRAPES) ×8 IMPLANT
DRSG OPSITE POSTOP 4X8 (GAUZE/BANDAGES/DRESSINGS) ×2 IMPLANT
DURAPREP 26ML APPLICATOR (WOUND CARE) ×2 IMPLANT
ELECT REM PT RETURN 9FT ADLT (ELECTROSURGICAL) ×2
ELECTRODE REM PT RTRN 9FT ADLT (ELECTROSURGICAL) ×1 IMPLANT
EVACUATOR 1/8 PVC DRAIN (DRAIN) IMPLANT
GAUZE SPONGE 4X4 12PLY STRL (GAUZE/BANDAGES/DRESSINGS) IMPLANT
GAUZE SPONGE 4X4 16PLY XRAY LF (GAUZE/BANDAGES/DRESSINGS) ×4 IMPLANT
GLOVE ECLIPSE 9.0 STRL (GLOVE) ×4 IMPLANT
GLOVE EXAM NITRILE LRG STRL (GLOVE) IMPLANT
GLOVE EXAM NITRILE XL STR (GLOVE) IMPLANT
GLOVE EXAM NITRILE XS STR PU (GLOVE) IMPLANT
GOWN STRL REUS W/ TWL LRG LVL3 (GOWN DISPOSABLE) ×2 IMPLANT
GOWN STRL REUS W/ TWL XL LVL3 (GOWN DISPOSABLE) ×2 IMPLANT
GOWN STRL REUS W/TWL 2XL LVL3 (GOWN DISPOSABLE) ×2 IMPLANT
GOWN STRL REUS W/TWL LRG LVL3 (GOWN DISPOSABLE) ×2
GOWN STRL REUS W/TWL XL LVL3 (GOWN DISPOSABLE) ×2
KIT BASIN OR (CUSTOM PROCEDURE TRAY) ×2 IMPLANT
KIT ROOM TURNOVER OR (KITS) ×2 IMPLANT
MILL MEDIUM DISP (BLADE) ×2 IMPLANT
NEEDLE HYPO 22GX1.5 SAFETY (NEEDLE) ×2 IMPLANT
NS IRRIG 1000ML POUR BTL (IV SOLUTION) ×2 IMPLANT
OIL CARTRIDGE MAESTRO DRILL (MISCELLANEOUS) ×2
PACK LAMINECTOMY NEURO (CUSTOM PROCEDURE TRAY) ×2 IMPLANT
ROD RADIUS 45MM (Rod) ×2 IMPLANT
ROD SPNL 45X5.5XNS TI RDS (Rod) ×2 IMPLANT
SCREW 5.75X45MM (Screw) ×8 IMPLANT
SPONGE SURGIFOAM ABS GEL 100 (HEMOSTASIS) ×2 IMPLANT
STRIP CLOSURE SKIN 1/2X4 (GAUZE/BANDAGES/DRESSINGS) ×2 IMPLANT
SUT VIC AB 0 CT1 18XCR BRD8 (SUTURE) ×1 IMPLANT
SUT VIC AB 0 CT1 8-18 (SUTURE) ×1
SUT VIC AB 2-0 CT1 18 (SUTURE) ×4 IMPLANT
SUT VIC AB 3-0 SH 8-18 (SUTURE) ×4 IMPLANT
TOWEL GREEN STERILE (TOWEL DISPOSABLE) ×2 IMPLANT
TOWEL GREEN STERILE FF (TOWEL DISPOSABLE) ×2 IMPLANT
TRAY FOLEY W/METER SILVER 16FR (SET/KITS/TRAYS/PACK) ×2 IMPLANT
WATER STERILE IRR 1000ML POUR (IV SOLUTION) ×2 IMPLANT

## 2017-10-28 NOTE — Telephone Encounter (Signed)
Pt has been scheduled.  °

## 2017-10-28 NOTE — Progress Notes (Signed)
Orthopedic Tech Progress Note Patient Details:  Garrett [name] 1951/11/16 [id number] Patient has was a pre-fit. Patient ID: Garrett [name], male   DOB: 14-Dec-1951, 66 y.o.   MRN: [id number]   Garrett [name] 10/28/2017, 6:39 PM

## 2017-10-28 NOTE — Anesthesia Postprocedure Evaluation (Signed)
Anesthesia Post Note  Patient: Garrett [name]  Procedure(s) Performed: Posterior Lumbar Interbody Fusion - Lumbar three-Lumbar four  removal of Nuvasive cortical screws (N/A Back)     Patient location during evaluation: PACU Anesthesia Type: General Level of consciousness: awake and alert, patient cooperative and oriented Pain management: pain level controlled Vital Signs Assessment: post-procedure vital signs reviewed and stable Respiratory status: spontaneous breathing, nonlabored ventilation, respiratory function stable and patient connected to nasal cannula oxygen Cardiovascular status: blood pressure returned to baseline and stable Postop Assessment: no apparent nausea or vomiting Anesthetic complications: no    Last Vitals:  Vitals:   10/28/17 1650 10/28/17 1659  BP: 112/70 128/78  Pulse: 85 74  Resp: 15 14  Temp: 36.7 C 36.7 C  SpO2: 95% 98%    Last Pain:  Vitals:   10/28/17 1659  TempSrc:   PainSc: 3                  Lea Baine,E. Ramsie Ostrander

## 2017-10-28 NOTE — Brief Op Note (Signed)
10/28/2017  3:50 PM  PATIENT:  Garrett [name]  66 y.o. male  PRE-OPERATIVE DIAGNOSIS:  Spondylolisthesis  POST-OPERATIVE DIAGNOSIS:  Spondylolisthesis  PROCEDURE:  Procedure(s): Posterior Lumbar Interbody Fusion - Lumbar three-Lumbar four  removal of Nuvasive cortical screws (N/A)  SURGEON:  Surgeon(s) and Role:    * [name], MD - Primary    * Consuella Lose, MD - Assisting  PHYSICIAN ASSISTANT:   ASSISTANTS:    ANESTHESIA:   general  EBL:  450 mL   BLOOD ADMINISTERED:none  DRAINS: none   LOCAL MEDICATIONS USED:  MARCAINE     SPECIMEN:  No Specimen  DISPOSITION OF SPECIMEN:  N/A  COUNTS:  YES  TOURNIQUET:  * No tourniquets in log *  DICTATION: .Dragon Dictation  PLAN OF CARE: Admit to inpatient   PATIENT DISPOSITION:  PACU - hemodynamically stable.   Delay start of Pharmacological VTE agent (>24hrs) due to surgical blood loss or risk of bleeding: yes

## 2017-10-28 NOTE — Transfer of Care (Signed)
Immediate Anesthesia Transfer of Care Note  Patient: Garrett [name]  Procedure(s) Performed: Posterior Lumbar Interbody Fusion - Lumbar three-Lumbar four  removal of Nuvasive cortical screws (N/A Back)  Patient Location: PACU  Anesthesia Type:General  Level of Consciousness: awake, alert , oriented and patient cooperative  Airway & Oxygen Therapy: Patient Spontanous Breathing  Post-op Assessment: Report given to RN and Post -op Vital signs reviewed and stable  Post vital signs: Reviewed and stable  Last Vitals:  Vitals:   10/28/17 1104 10/28/17 1559  BP: 140/76 134/72  Pulse: 73 82  Resp: 18 14  Temp:  36.7 C  SpO2: 98% 96%    Last Pain:  Vitals:   10/28/17 1148  TempSrc:   PainSc: 2       Patients Stated Pain Goal: 2 (24/81/85 9093)  Complications: No apparent anesthesia complications

## 2017-10-28 NOTE — Anesthesia Procedure Notes (Signed)
Procedure Name: Intubation Date/Time: 10/28/2017 12:55 PM Performed by: Teressa Lower., CRNA Pre-anesthesia Checklist: Patient identified, Emergency Drugs available, Suction available and Patient being monitored Patient Re-evaluated:Patient Re-evaluated prior to induction Oxygen Delivery Method: Circle system utilized Preoxygenation: Pre-oxygenation with 100% oxygen Induction Type: IV induction Ventilation: Mask ventilation without difficulty Laryngoscope Size: Mac and 3 Grade View: Grade I Tube type: Oral Tube size: 7.5 mm Number of attempts: 1 Airway Equipment and Method: Stylet and Oral airway Placement Confirmation: ETT inserted through vocal cords under direct vision,  positive ETCO2 and breath sounds checked- equal and bilateral Secured at: 23 cm Tube secured with: Tape Dental Injury: Teeth and Oropharynx as per pre-operative assessment  Comments: Intubation per Vickii Penna, SRNA

## 2017-10-28 NOTE — Telephone Encounter (Signed)
Pt due for fasting physical please call and schedule.
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
## 2017-10-28 NOTE — Anesthesia Preprocedure Evaluation (Addendum)
Anesthesia Evaluation  Patient identified by MRN, date of birth, ID band Patient awake    Reviewed: Allergy & Precautions, NPO status , Patient's Chart, lab work & pertinent test results  History of Anesthesia Complications Negative for: history of anesthetic complications  Airway Mallampati: I  TM Distance: >3 FB Neck ROM: Full    Dental  (+) Edentulous Upper, Edentulous Lower   Pulmonary sleep apnea and Continuous Positive Airway Pressure Ventilation , COPD,  COPD inhaler, former smoker (quit 1992),    breath sounds clear to auscultation       Cardiovascular hypertension, Pt. on medications and Pt. on home beta blockers (-) angina Rhythm:Regular Rate:Normal  3/18 ECHO: EF 65-70%, valves ok   Neuro/Psych Chronic back pain: narcotics    GI/Hepatic Neg liver ROS, GERD  Medicated and Poorly Controlled,  Endo/Other  diabetes (glu 110), Oral Hypoglycemic AgentsMorbid obesity  Renal/GU negative Renal ROS     Musculoskeletal  (+) Arthritis ,   Abdominal (+) + obese,   Peds  Hematology negative hematology ROS (+)   Anesthesia Other Findings   Reproductive/Obstetrics                            Anesthesia Physical Anesthesia Plan  ASA: III  Anesthesia Plan: General   Post-op Pain Management:    Induction: Intravenous  PONV Risk Score and Plan: 2 and Ondansetron and Dexamethasone  Airway Management Planned: Oral ETT  Additional Equipment:   Intra-op Plan:   Post-operative Plan: Extubation in OR  Informed Consent: I have reviewed the patients History and Physical, chart, labs and discussed the procedure including the risks, benefits and alternatives for the proposed anesthesia with the patient or authorized representative who has indicated his/her understanding and acceptance.     Plan Discussed with: CRNA and Surgeon  Anesthesia Plan Comments: (Plan routine monitors, GETA)         Anesthesia Quick Evaluation

## 2017-10-28 NOTE — Plan of Care (Signed)
  Progressing Safety: Ability to remain free from injury will improve 10/28/2017 2041 - Progressing by Charlena Cross, RN Activity: Ability to avoid complications of mobility impairment will improve 10/28/2017 2041 - Progressing by Charlena Cross, RN Ability to tolerate increased activity will improve 10/28/2017 2041 - Progressing by Charlena Cross, RN Will remain free from falls 10/28/2017 2041 - Progressing by Margot Chimes D, RN Bowel/Gastric: Gastrointestinal status for postoperative course will improve 10/28/2017 2041 - Progressing by Charlena Cross, RN Education: Ability to verbalize activity precautions or restrictions will improve 10/28/2017 2041 - Progressing by Margot Chimes D, RN Knowledge of the prescribed therapeutic regimen will improve 10/28/2017 2041 - Progressing by Charlena Cross, RN Understanding of discharge needs will improve 10/28/2017 2041 - Progressing by Charlena Cross, RN Physical Regulation: Ability to maintain clinical measurements within normal limits will improve 10/28/2017 2041 - Progressing by Charlena Cross, RN Postoperative complications will be avoided or minimized 10/28/2017 2041 - Progressing by Charlena Cross, RN Diagnostic test results will improve 10/28/2017 2041 - Progressing by Margot Chimes D, RN Pain Management: Pain level will decrease 10/28/2017 2041 - Progressing by Charlena Cross, RN Skin Integrity: Signs of wound healing will improve 10/28/2017 2041 - Progressing by Margot Chimes D, RN Health Behavior/Discharge Planning: Identification of resources available to assist in meeting health care needs will improve 10/28/2017 2041 - Progressing by Margot Chimes D, RN Bladder/Genitourinary: Urinary functional status for postoperative course will improve 10/28/2017 2041 - Progressing by Charlena Cross, RN

## 2017-10-28 NOTE — Op Note (Signed)
Date of procedure: 10/28/2017  Date of dictation: Same  Service: Neurosurgery  Preoperative diagnosis: L3-L4 unstable spondylolisthesis with stenosis, status post previous L2-L3 decompression infusion with pedicle screw fixation  Postoperative diagnosis: Same, traumatic spondylolysis of L3  Procedure Name: Bilateral L3/L4 Gill procedure (decompression of both L3 and L4 nerve roots); much more than would be expected or required for simple her body fusion alone secondary to the unrecognized spondylolysis  L3-L4 posterior lumbar interbody fusion utilizing interbody cages and locally harvested autograft  L3-4 posterior lateral arthrodesis utilizing nonsegmental pedicle screw fixation and local autograft  Reexploration of prior L2-3 fusion with removal of hardware necessary secondary to the fracture of L3.  Surgeon:Creasie Lacosse A.Sari Cogan, M.D.  Asst. Surgeon: Kathyrn Sheriff  Anesthesia: General  Indication: 66 year old male status post previous lumbar decompression and fusion L2-3 with reasonably good results. Patient presents now with abrupt onset of severe back pain and bilateral lower extremity symptoms. Workup demonstrates evidence of a mobile spondylolisthesis at L3-4 with accompanying stenosis. Patient presents now for decompression and fusion at L3-4.  Operative note: After induction of anesthesia, patient position prone onto Wilson frame and a properly padded. Lumbar region prepped and draped sterilely. Incision made overlying L2-L4. Dissection performed bilaterally. Retractor placed. Fluoroscopy used. Levels confirmed. Previously placed pedicle screw instrumentation L2-3 was identified. This was disassembled. The screws into L2 were removed. The screws and L3 appeared solid however it was unusual in that the lamina and facet joints of L3 were now hypermobile. Dissecting this further it became apparent there is been traumatic spondylolysis of L3 which extended into the pedicle screw track from the  prior cortical pedicle screws at L3. These were then removed. Complete laminectomy of L3 was then performed using Leksell rongeurs Kerrison rongeurs the high-speed drill to remove the entire lamina of L3. I pars and articularis and inferior facets were resected and removed. The fracture pars was dissected free and resected. Ligament flavum was elevated and resected. Gill procedure present performed along the course the exiting L3 nerve roots bilaterally fully decompressing the L3 nerve roots. Foraminotomies completed on the L4 nerve roots bilaterally. Epidermides pus is was coagulated and cut. Bilateral discectomies then performed at L3-4. Disc space was then prepared for interbody fusion. With the distractor placed the patient's right side disc space was scraped and cleaned of soft tissue on the left side. A 9 mm extra lordotic Medtronic expandable cage packed with morselized autograft was then impacted into place and expanded to its full extent. Distractor removed patient's right side. Disc space prepared on the right side for fusion. Soft tissue removed and interspace. Morselize autograft packed into the interspace. A second cage packed with autograft was then impacted into place and expanded to its full extent. This achieved good reduction of the patient's deformity and good distraction the interspace. Pedicles of L3 and L4 were then identified using surface landmarks and intraoperative fluoroscopy. Superficial bone was removed overlying the Pedicles and removed using high-speed drill. Each pedicles and probed using a pedicle awl each pedicle awl track was then tapped with a screw tap. Each tap hole was probed and found to be solidly within the bone. 5.75 x 45 mm radius brand screws [employer] were placed bilaterally at L3 and L4. Final images revealed good position the cages and the pedicle screws at the proper upper level with normal lamina spine. Wounds and irrigated one final time. Transverse processes  were decorticated. Morselize autograft was packed posterior laterally. Short segment titanium rods and placed over the screw  heads at L3 and L4. Locking caps placed over the screws. Locking caps and engaged with the construct under compression. Gelfoam was placed topically overlying the laminectomy defect. Vancomycin powder placed the deep wound space. Wounds and close in layers with Vicryl sutures. Steri-Strips and sterile dressing were applied. No apparent complications. Patient tolerated the procedure well and he returns to the recovery room postop.

## 2017-10-29 ENCOUNTER — Other Ambulatory Visit: Payer: Self-pay

## 2017-10-29 LAB — GLUCOSE, CAPILLARY: GLUCOSE-CAPILLARY: 157 mg/dL — AB (ref 65–99)

## 2017-10-29 MED ORDER — DIAZEPAM 5 MG PO TABS: 5.0000 mg | ORAL_TABLET | Freq: Four times a day (QID) | ORAL | 0 refills | Status: DC | PRN

## 2017-10-29 MED ORDER — OXYCODONE-ACETAMINOPHEN 10-325 MG PO TABS: 0.5000 | ORAL_TABLET | ORAL | 0 refills | Status: DC | PRN

## 2017-10-29 MED FILL — OXYCODONE-APAP 10-325 MG TA: 10-325 | 7 days supply | Qty: 40 | Fill #0

## 2017-10-29 MED FILL — diazePAM 5 MG TABS: 5 | 4 days supply | Qty: 30 | Fill #0

## 2017-10-29 NOTE — Evaluation (Signed)
Physical Therapy Evaluation Patient Details Name: Garrett [name] MRN: [id number] DOB: 02/03/1951 Today's Date: 10/29/2017   History of Present Illness  Patient is a 66 y/o male who presents s/p L3-4 PLIF. PMH includes HTN, PE, DM, CHF.   Clinical Impression  Patient presents with pain and post surgical deficits s/p above surgery. Tolerated gait training with supervision for safety. Pt has support at home from wife. Tolerated stair training with supervision for safety. Education re: back precautions, handout, positioning, log roll technique etc. Pt does not require skilled therapy services as pt functioning close to baseline. All education completed. Discharge from therapy.    Follow Up Recommendations No PT follow up;Supervision - Intermittent    Equipment Recommendations  None recommended by PT    Recommendations for Other Services       Precautions / Restrictions Precautions Precautions: Back Precaution Booklet Issued: Yes (comment) Precaution Comments: Reviewed handout and precautions Required Braces or Orthoses: Spinal Brace Spinal Brace: Lumbar corset;Applied in sitting position Restrictions Weight Bearing Restrictions: No      Mobility  Bed Mobility               General bed mobility comments: not assessed as pt standing in hallway with OT upon arrival.  Transfers Overall transfer level: Needs assistance Equipment used: Rolling walker (2 wheeled) Transfers: Sit to/from Stand Sit to Stand: Supervision         General transfer comment: Supervision for safety.   Ambulation/Gait Ambulation/Gait assistance: Supervision Ambulation Distance (Feet): 350 Feet Assistive device: Rolling walker (2 wheeled) Gait Pattern/deviations: Step-through pattern;Decreased stride length;Trunk flexed Gait velocity: decreased   General Gait Details: Slow, mostly steady gait with 2 standing rest breaks due to 2/4 DOE.   Stairs Stairs: Yes Stairs assistance:  Supervision Stair Management: Step to pattern;Two rails Number of Stairs: 4 General stair comments: Cues for technique/safety.   Wheelchair Mobility    Modified Rankin (Stroke Patients Only)       Balance Overall balance assessment: Needs assistance;History of Falls Sitting-balance support: Feet supported;No upper extremity supported Sitting balance-Leahy Scale: Good     Standing balance support: During functional activity Standing balance-Leahy Scale: Poor Standing balance comment: Requires UE support in standing.                             Pertinent Vitals/Pain Pain Assessment: Faces Faces Pain Scale: Hurts little more Pain Location: back Pain Descriptors / Indicators: Operative site guarding;Sore Pain Intervention(s): Monitored during session;Repositioned;Patient requesting pain meds-RN notified    Home Living Family/patient expects to be discharged to:: Private residence Living Arrangements: Spouse/significant other Available Help at Discharge: Family;Available 24 hours/day Type of Home: House Home Access: Stairs to enter Entrance Stairs-Rails: Can reach both Entrance Stairs-Number of Steps: 5 Home Layout: One level Home Equipment: Walker - 2 wheels      Prior Function Level of Independence: Independent with assistive device(s)         Comments: Uses SPC as needed for ambulation. Works for [employer] with son. Reports fall.     Hand Dominance   Dominant Hand: Right    Extremity/Trunk Assessment   Upper Extremity Assessment Upper Extremity Assessment: Defer to OT evaluation    Lower Extremity Assessment Lower Extremity Assessment: Generalized weakness    Cervical / Trunk Assessment Cervical / Trunk Assessment: Other exceptions Cervical / Trunk Exceptions: s/p spine surgery  Communication   Communication: HOH  Cognition Arousal/Alertness: Awake/alert Behavior During Therapy: [facility]  for tasks  assessed/performed Overall Cognitive Status: Within Functional Limits for tasks assessed                                        General Comments General comments (skin integrity, edema, etc.): Wife present towards end of session.    Exercises     Assessment/Plan    PT Assessment Patent does not need any further PT services  PT Problem List         PT Treatment Interventions      PT Goals (Current goals can be found in the Care Plan section)  Acute Rehab PT Goals Patient Stated Goal: to go home and return to work PT Goal Formulation: All assessment and education complete, DC therapy    Frequency     Barriers to discharge        Co-evaluation               AM-PAC PT "6 Clicks" Daily Activity  Outcome Measure Difficulty turning over in bed (including adjusting bedclothes, sheets and blankets)?: None Difficulty moving from lying on back to sitting on the side of the bed? : None Difficulty sitting down on and standing up from a chair with arms (e.g., wheelchair, bedside commode, etc,.)?: None Help needed moving to and from a bed to chair (including a wheelchair)?: None Help needed walking in hospital room?: None Help needed climbing 3-5 steps with a railing? : None 6 Click Score: 24    End of Session Equipment Utilized During Treatment: Gait belt;Back brace Activity Tolerance: Patient tolerated treatment well Patient left: in bed;with call bell/phone within reach;with family/visitor present Nurse Communication: Mobility status      Time: 0811-0825 PT Time Calculation (min) (ACUTE ONLY): 14 min   Charges:   PT Evaluation $PT Eval Low Complexity: 1 Low     PT G CodesWray [name], PT, DPT [phone]    Garrett [name] 10/29/2017, 10:00 AM

## 2017-10-29 NOTE — Discharge Instructions (Signed)
Wound Care Keep incision covered and dry for two days.  If you shower, cover incision with plastic wrap.  Do not put any creams, lotions, or ointments on incision. Leave steri-strips on back.  They will fall off by themselves. Activity Walk each and every day, increasing distance each day. No lifting greater than 5 lbs.  Avoid excessive neck motion. No driving for 2 weeks; may ride as a passenger locally. If provided with back brace, wear when out of bed.  It is not necessary to wear brace in bed. Diet Resume your normal diet.  Return to Work Will be discussed at you follow up appointment. Call Your Doctor If Any of These Occur Redness, drainage, or swelling at the wound.  Temperature greater than 101 degrees. Severe pain not relieved by pain medication. Incision starts to come apart. Follow Up Appt Call today for appointment in 1-2 weeks ([phone]) or for problems.  If you have any hardware placed in your spine, you will need an x-ray before your appointment.     RESTART ELIQUIS ON SUNDAY

## 2017-10-29 NOTE — Progress Notes (Signed)
RT placed patient on CPAP. Settings of 70max and 44min which is his home settings. Patient tolerating well at this time.

## 2017-10-29 NOTE — Discharge Summary (Signed)
Physician Discharge Summary  Patient ID: Garrett [name] MRN: [id number] DOB/AGE: 67/30/1952 67 y.o.  Admit date: 10/28/2017 Discharge date: 10/29/2017  Admission Diagnoses:  Discharge Diagnoses:  Active Problems:   Spondylolisthesis at L3-L4 level   Discharged Condition: good  Hospital Course: Patient mid-hospital where he underwent uncomplicated L3 for decompression and fusion. Postoperative doing very well. Preoperative back and lower extremity pain very much improved. Standing and ambulating without difficulty. Patient feels ready for discharge home.  Consults:   Significant Diagnostic Studies:   Treatments:   Discharge Exam: Blood pressure (!) 142/85, pulse 94, temperature 97.7 F (36.5 C), temperature source Oral, resp. rate 20, height 5\' 9"  (1.753 m), weight 105.2 kg (231 lb 13.9 oz), SpO2 93 %. Awake and alert. Oriented and appropriate. Motor and sensory function extremities intact. Wound clean and dry. Chest and abdomen benign.  Disposition: 01-Home or Self Care   Allergies as of 10/29/2017      Reactions   Bee Venom Anaphylaxis   Garlic Swelling      Medication List    TAKE these medications   acidophilus Caps capsule Take 1 capsule by mouth at bedtime.   PROAIR HFA 108 (90 Base) MCG/ACT inhaler Generic drug:  albuterol Inhale 1 puff into the lungs every 6 (six) hours as needed for wheezing or shortness of breath.   albuterol (5 MG/ML) 0.5% nebulizer solution Commonly known as:  PROVENTIL Take 0.5 mLs (2.5 mg total) by nebulization every 4 (four) hours as needed for wheezing or shortness of breath.   allopurinol 100 MG tablet Commonly known as:  ZYLOPRIM Take 1 tablet (100 mg total) by mouth daily.   aspirin EC 81 MG tablet Take 81 mg by mouth daily.   budesonide-formoterol 80-4.5 MCG/ACT inhaler Commonly known as:  SYMBICORT Inhale 2 puffs into the lungs 2 (two) times daily.   celecoxib 200 MG capsule Commonly known as:  CELEBREX Take 1  capsule (200 mg total) by mouth 2 (two) times daily.   cyanocobalamin 500 MCG tablet Take 500 mcg by mouth daily.   diazepam 5 MG tablet Commonly known as:  VALIUM Take 1-2 tablets (5-10 mg total) every 6 (six) hours as needed by mouth for muscle spasms.   EPIPEN 0.3 mg/0.3 mL Soaj injection Generic drug:  EPINEPHrine Inject 0.3 mg into the muscle once.   furosemide 20 MG tablet Commonly known as:  LASIX Take 1 tablet (20 mg total) by mouth daily.   gabapentin 300 MG capsule Commonly known as:  NEURONTIN Take 1 capsule (300 mg total) by mouth at bedtime.   levocetirizine 5 MG tablet Commonly known as:  XYZAL Take 1 tablet (5 mg total) by mouth every evening.   levocetirizine 5 MG tablet Commonly known as:  XYZAL TAKE 1 TABLET BY MOUTH EVERY EVENING.   magnesium oxide 400 (241.3 Mg) MG tablet Commonly known as:  MAG-OX Take 1 tablet (400 mg total) by mouth daily.   memantine 10 MG tablet Commonly known as:  NAMENDA Take 1 tablet (10 mg total) by mouth 2 (two) times daily. What changed:    how much to take  when to take this   metFORMIN 500 MG tablet Commonly known as:  GLUCOPHAGE Take 1 tablet (500 mg total) by mouth 2 (two) times daily with a meal.   metoprolol tartrate 25 MG tablet Commonly known as:  LOPRESSOR Take 1 tablet (25 mg total) by mouth daily.   mometasone 50 MCG/ACT nasal spray Commonly known as:  NASONEX Place 2  sprays into the nose daily.   ONE-A-DAY WEIGHT SMART ADVANCE PO Take 1 tablet by mouth daily.   oxyCODONE-acetaminophen 10-325 MG tablet Commonly known as:  PERCOCET Take 0.5-1 tablets every 4 (four) hours as needed by mouth for pain.   pantoprazole 40 MG tablet Commonly known as:  PROTONIX Take 1 tablet (40 mg total) by mouth 2 (two) times daily.   potassium chloride SA 20 MEQ tablet Commonly known as:  K-DUR,KLOR-CON Take 2 tablets (40 mEq total) by mouth daily.   pravastatin 10 MG tablet Commonly known as:  PRAVACHOL Take  1 tablet (10 mg total) by mouth daily.   rivaroxaban 20 MG Tabs tablet Commonly known as:  XARELTO Take 1 tablet (20 mg total) by mouth daily.   terazosin 5 MG capsule Commonly known as:  HYTRIN TAKE 1 CAPSULE BY MOUTH ONCE AT BEDTIME What changed:    how much to take  how to take this  when to take this  additional instructions   terazosin 5 MG capsule Commonly known as:  HYTRIN TAKE 1 CAPSULE BY MOUTH ONCE AT BEDTIME What changed:  Another medication with the same name was changed. Make sure you understand how and when to take each.   topiramate 25 MG tablet Commonly known as:  TOPAMAX Take 1 tablet (25 mg total) by mouth 3 (three) times daily.   verapamil 240 MG CR tablet Commonly known as:  CALAN-SR TAKE 1 TABLET BY MOUTH AT BEDTIME. What changed:    how much to take  how to take this  when to take this   VITAMIN D3 PO Take 1 capsule by mouth daily.            Durable Medical Equipment  (From admission, onward)        Start     Ordered   10/28/17 1738  DME Walker rolling  Once    Question:  Patient needs a walker to treat with the following condition  Answer:  Spondylolisthesis at L3-L4 level   10/28/17 1738   10/28/17 1738  DME 3 n 1  Once     10/28/17 1738       Signed: Kailany Dinunzio A 10/29/2017, 9:50 AM

## 2017-10-29 NOTE — Progress Notes (Signed)
Patient is discharged from room 3C10 at this time. Alert and in stable condition. IV site d/c'd and instructions read to patient and spouse with understanding verbalized. Left unit via wheelchair with all belongings at side.  

## 2017-10-29 NOTE — Evaluation (Signed)
Occupational Therapy Evaluation Patient Details Name: Garrett [name] MRN: [id number] DOB: [birth year] Today's Date: 10/29/2017    History of Present Illness Patient is a 66 y/o male who presents s/p L3-4 PLIF. PMH includes HTN, PE, DM, CHF.    Clinical Impression   PTA, pt was living with his wife and was independent and working. Currently, pt requires supervision-Min Guard for ADLs and functional mobility using RW; pt requiring Min A for shower transfer for safety and balance. Provided education and handout on back precautions, LB ADLs with AE, toilet transfer, and shower transfer; pt demonstrated understanding. Answered all pt questions. Recommend dc home once medically stable per physician. All acute OT needs met and will sign off. Thank you.     Follow Up Recommendations  No OT follow up;Supervision/Assistance - 24 hour    Equipment Recommendations  None recommended by OT    Recommendations for Other Services PT consult     Precautions / Restrictions Precautions Precautions: Back Precaution Booklet Issued: Yes (comment) Precaution Comments: Reviewed handout and precautions Required Braces or Orthoses: Spinal Brace Spinal Brace: Lumbar corset;Applied in sitting position Restrictions Weight Bearing Restrictions: No      Mobility Bed Mobility Overal bed mobility: Needs Assistance Bed Mobility: Rolling;Sidelying to Sit Rolling: Supervision Sidelying to sit: Supervision       General bed mobility comments: VCs for education and sequencing of log roll. Pt using bed rail to roll towards side  Transfers Overall transfer level: Needs assistance Equipment used: Rolling walker (2 wheeled) Transfers: Sit to/from Stand Sit to Stand: Supervision         General transfer comment: Supervision for safety.     Balance Overall balance assessment: Needs assistance;History of Falls Sitting-balance support: Feet supported;No upper extremity supported Sitting balance-Leahy  Scale: Good     Standing balance support: During functional activity Standing balance-Leahy Scale: Poor Standing balance comment: Requires UE support in standing.                           ADL either performed or assessed with clinical judgement   ADL Overall ADL's : Needs assistance/impaired Eating/Feeding: Set up;Sitting   Grooming: Min guard;Standing   Upper Body Bathing: Set up;Supervision/ safety;Sitting   Lower Body Bathing: Min guard;Sit to/from stand   Upper Body Dressing : Set up;Supervision/safety;Sitting Upper Body Dressing Details (indicate cue type and reason): Pt donned shirt and brace Lower Body Dressing: Min guard;With adaptive equipment;Cueing for sequencing;Sit to/from stand;Adhering to back precautions Lower Body Dressing Details (indicate cue type and reason): Pt donned pants with AE.  Toilet Transfer: Min guard;Ambulation;RW       Tub/ Shower Transfer: Minimal assistance;Cueing for safety;Cueing for sequencing;Ambulation;Rolling walker   Functional mobility during ADLs: Min guard;Rolling walker General ADL Comments: Pt performing ADLs and funcitonal mobility at [employer] level. Pt adhereing to bac kprecautions and demonstrating understanding of compensatory tehcniques for LB ADLs, toileting, and shower transfer. Pt requiring increased physial A for shower transfer for safety and balance.      Vision Baseline Vision/History: Wears glasses Wears Glasses: At all times Patient Visual Report: No change from baseline       Perception     Praxis      Pertinent Vitals/Pain Pain Assessment: Faces Faces Pain Scale: Hurts little more Pain Location: back Pain Descriptors / Indicators: Operative site guarding;Sore Pain Intervention(s): Monitored during session;Repositioned;Patient requesting pain meds-RN notified     Hand Dominance Right   Extremity/Trunk Assessment Upper Extremity  Assessment Upper Extremity Assessment: Overall WFL for tasks  assessed   Lower Extremity Assessment Lower Extremity Assessment: Defer to PT evaluation   Cervical / Trunk Assessment Cervical / Trunk Assessment: Other exceptions Cervical / Trunk Exceptions: s/p spine surgery   Communication Communication Communication: HOH   Cognition Arousal/Alertness: Awake/alert Behavior During Therapy: WFL for tasks assessed/performed Overall Cognitive Status: Within Functional Limits for tasks assessed                                     General Comments  Wife present towards end of session.    Exercises     Shoulder Instructions      Home Living Family/patient expects to be discharged to:: Private residence Living Arrangements: Spouse/significant other Available Help at Discharge: Family;Available PRN/intermittently Type of Home: House Home Access: Stairs to enter [employer] of Steps: 5 Entrance Stairs-Rails: Can reach both Home Layout: One level     Bathroom Shower/Tub: Occupational psychologist: Handicapped height     Home Equipment: Environmental consultant - 2 wheels;Shower seat - built in;Adaptive equipment(Adjustable bed) Adaptive Equipment: Reacher        Prior Functioning/Environment Level of Independence: Independent with assistive device(s)        Comments: Uses SPC as needed for ambulation. Performs ADLs and IADLs. Works for [employer] with son. Reports fall.        OT Problem List: Decreased range of motion;Decreased activity tolerance;Impaired balance (sitting and/or standing);Decreased knowledge of use of DME or AE;Decreased knowledge of precautions;Pain      OT Treatment/Interventions:      OT Goals(Current goals can be found in the care plan section) Acute Rehab OT Goals Patient Stated Goal: to go home and return to work OT Goal Formulation: With patient Time For Goal Achievement: 11/12/17 Potential to Achieve Goals: Good  OT Frequency:     Barriers to D/C:             Co-evaluation              AM-PAC PT "6 Clicks" Daily Activity     Outcome Measure Help from another person eating meals?: None Help from another person taking care of personal grooming?: A Little Help from another person toileting, which includes using toliet, bedpan, or urinal?: A Little Help from another person bathing (including washing, rinsing, drying)?: A Little Help from another person to put on and taking off regular upper body clothing?: A Little Help from another person to put on and taking off regular lower body clothing?: A Little 6 Click Score: 19   End of Session Equipment Utilized During Treatment: Rolling walker;Back brace Nurse Communication: Mobility status;Precautions  Activity Tolerance: Patient tolerated treatment well Patient left: (With PT in hall)  OT Visit Diagnosis: Unsteadiness on feet (R26.81);Other abnormalities of gait and mobility (R26.89);Muscle weakness (generalized) (M62.81);Pain Pain - Right/Left: (Back) Pain - part of body: (Back)                Time: 5956-3875 OT Time Calculation (min): 17 min Charges:  OT General Charges $OT Visit: 1 Visit OT Evaluation $OT Eval Moderate Complexity: 1 Mod G-Codes:     Demari Kropp MSOT, OTR/L Acute Rehab Pager: [phone] Office: [place] 10/29/2017, 11:47 AM

## 2017-10-30 MED FILL — Heparin Sodium (Porcine) Inj 1000 Unit/ML: INTRAMUSCULAR | Qty: 30 | Status: AC

## 2017-10-30 MED FILL — Sodium Chloride IV Soln 0.9%: INTRAVENOUS | Qty: 1000 | Status: AC

## 2017-11-03 ENCOUNTER — Telehealth: Payer: Self-pay

## 2017-11-03 NOTE — Telephone Encounter (Signed)
Hospital follow up appointment scheduled for patient with E. Saguier PA-C.

## 2017-11-06 ENCOUNTER — Encounter: Payer: Self-pay | Admitting: Medical

## 2017-11-06 ENCOUNTER — Ambulatory Visit: Payer: PPO | Admitting: Medical

## 2017-11-06 VITALS — BP 125/73 | HR 73 | Temp 97.7°F | Resp 16 | Ht 69.0 in | Wt 232.6 lb

## 2017-11-06 DIAGNOSIS — M545 Low back pain: Secondary | ICD-10-CM

## 2017-11-06 DIAGNOSIS — G8929 Other chronic pain: Secondary | ICD-10-CM | POA: Diagnosis not present

## 2017-11-06 MED ORDER — CYCLOBENZAPRINE HCL 5 MG PO TABS
5.0000 mg | ORAL_TABLET | Freq: Every day | ORAL | 0 refills | Status: DC
Start: 2017-11-06 — End: 2018-08-12

## 2017-11-06 MED ORDER — BUDESONIDE-FORMOTEROL FUMARATE 80-4.5 MCG/ACT IN AERO: 2.0000 | INHALATION_SPRAY | Freq: Two times a day (BID) | RESPIRATORY_TRACT | 11 refills | Status: DC

## 2017-11-06 MED FILL — SYMBICORT 80-4.5 MCG INH: 80-4.5 | 30 days supply | Qty: 10 | Fill #0

## 2017-11-06 NOTE — Patient Instructions (Signed)
For your chronic low back pain and some pain post surgery, I would recommend that you continue pain medication at night.  It appears that you are also her on Valium.  You mentioned that you take this at night.  I think narcotic medications with benzodiazepines carry some interaction risk.  I think it would be best for you to stop the Valium and I will give you a low dose of 5 mg prescription of Flexeril.  Use the Flexeril at night and let me know if you feel this is over sedating.  No Flexeril use during the day.  Follow-up with your neurosurgeon tomorrow and we will review their notes that are sent to [place].  Follow-up as regular scheduled with your PCP or as needed.
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
## 2017-11-10 ENCOUNTER — Other Ambulatory Visit: Payer: Self-pay | Admitting: Family Medicine

## 2017-11-10 DIAGNOSIS — D500 Iron deficiency anemia secondary to blood loss (chronic): Secondary | ICD-10-CM

## 2017-11-10 DIAGNOSIS — I2699 Other pulmonary embolism without acute cor pulmonale: Secondary | ICD-10-CM

## 2017-11-11 MED FILL — metFORMIN HCL 500 MG TABS: 500 | 90 days supply | Qty: 180 | Fill #0

## 2017-11-17 ENCOUNTER — Encounter: Payer: Self-pay | Admitting: Hematology & Oncology

## 2017-11-17 ENCOUNTER — Other Ambulatory Visit: Payer: Self-pay

## 2017-11-17 ENCOUNTER — Other Ambulatory Visit: Payer: Self-pay | Admitting: *Deleted

## 2017-11-17 ENCOUNTER — Other Ambulatory Visit (HOSPITAL_BASED_OUTPATIENT_CLINIC_OR_DEPARTMENT_OTHER): Payer: PPO

## 2017-11-17 ENCOUNTER — Ambulatory Visit (HOSPITAL_BASED_OUTPATIENT_CLINIC_OR_DEPARTMENT_OTHER): Payer: PPO | Admitting: Hematology & Oncology

## 2017-11-17 VITALS — BP 122/70 | HR 81 | Temp 97.8°F | Resp 20 | Wt 229.0 lb

## 2017-11-17 DIAGNOSIS — I2699 Other pulmonary embolism without acute cor pulmonale: Secondary | ICD-10-CM

## 2017-11-17 DIAGNOSIS — D6862 Lupus anticoagulant syndrome: Secondary | ICD-10-CM

## 2017-11-17 LAB — CMP (CANCER CENTER ONLY)
ALBUMIN: 3.4 g/dL (ref 3.3–5.5)
ALK PHOS: 137 U/L — AB (ref 26–84)
ALT: 45 U/L (ref 10–47)
AST: 27 U/L (ref 11–38)
BUN: 16 mg/dL (ref 7–22)
CO2: 21 mEq/L (ref 18–33)
Calcium: 9.2 mg/dL (ref 8.0–10.3)
Chloride: 107 mEq/L (ref 98–108)
Creat: 1.1 mg/dl (ref 0.6–1.2)
Glucose, Bld: 149 mg/dL — ABNORMAL HIGH (ref 73–118)
POTASSIUM: 3.5 meq/L (ref 3.3–4.7)
Sodium: 142 mEq/L (ref 128–145)
TOTAL PROTEIN: 7.2 g/dL (ref 6.4–8.1)
Total Bilirubin: 0.5 mg/dl (ref 0.20–1.60)

## 2017-11-17 LAB — CBC WITH DIFFERENTIAL (CANCER CENTER ONLY)
BASO#: 0 10*3/uL (ref 0.0–0.2)
BASO%: 0.2 % (ref 0.0–2.0)
EOS ABS: 0.3 10*3/uL (ref 0.0–0.5)
EOS%: 3.4 % (ref 0.0–7.0)
HEMATOCRIT: 35.6 % — AB (ref 38.7–49.9)
HEMOGLOBIN: 11.5 g/dL — AB (ref 13.0–17.1)
LYMPH#: 1.6 10*3/uL (ref 0.9–3.3)
LYMPH%: 18.1 % (ref 14.0–48.0)
MCH: 29.8 pg (ref 28.0–33.4)
MCHC: 32.3 g/dL (ref 32.0–35.9)
MCV: 92 fL (ref 82–98)
MONO#: 0.4 10*3/uL (ref 0.1–0.9)
MONO%: 4.7 % (ref 0.0–13.0)
NEUT#: 6.7 10*3/uL — ABNORMAL HIGH (ref 1.5–6.5)
NEUT%: 73.6 % (ref 40.0–80.0)
PLATELETS: 376 10*3/uL (ref 145–400)
RBC: 3.86 10*6/uL — AB (ref 4.20–5.70)
RDW: 13.7 % (ref 11.1–15.7)
WBC: 9.1 10*3/uL (ref 4.0–10.0)

## 2017-11-17 MED ORDER — RIVAROXABAN 10 MG PO TABS: 20.0000 mg | ORAL_TABLET | Freq: Every day | ORAL | 3 refills | Status: DC

## 2017-11-17 MED ORDER — RIVAROXABAN 10 MG PO TABS: 10.0000 mg | ORAL_TABLET | Freq: Every day | ORAL | 3 refills | Status: DC

## 2017-11-17 MED FILL — XARELTO 10 MG TABLET: 10 | 30 days supply | Qty: 30 | Fill #0

## 2017-11-17 NOTE — Progress Notes (Signed)
Hematology and Oncology Follow Up Visit  Garrett [name] [id number] [birth year] 66 y.o. 11/17/2017   Principle Diagnosis:   Bilateral pulmonary emboli-no cor pulmonale  Lupus Anticoagulant (+)  Current Therapy:    Xarelto 20 mg by mouth daily-completed 9 months in 10/2017  Xarelto 10 mg po q day - maintanenece to complete 1 yr in 11/2018.  EC ASA 81 mg po q day     Interim History:  Garrett [name] is back for follow-up.  Surprisingly enough, he required lumbar spinal fusion just 3 weeks ago.  I am absolutely amazed to see how good he looks.  It is obvious that he had an incredible resolved which is no surprise as a Psychologist, sport and exercise was Dr. Granville Lewis.  He has a brace.  He needs to wear the brace for about 6 months.  He has a rolling walker.  He had no bleeding with the procedure.  I read the operative note.  There is no excessive bleeding.  He had been on Xarelto.  Xarelto was stopped about 3 days before the procedure.  It was then restarted about 3 days after his procedure.  His aspirin was also stopped.  He has had no problems with the Xarelto.  He has been on Xarelto now for about 10 months.  I think we should get him on to maintenance Xarelto now.  His last CT angiogram of the chest that was done back in May showed no evidence of pulmonary emboli.  He is on the baby aspirin.  He does have a positive lupus anticoagulant.  We checked this twice and he was positive twice.  He has had no cough.  Said no chest wall pain.  He has had no nausea or vomiting.  Has had no bleeding.  He has had no change in bowel or bladder habits.  Overall, his performance status is ECOG 1.   Medications:  Current Outpatient Medications:  .  acidophilus (RISAQUAD) CAPS capsule, Take 1 capsule by mouth at bedtime. , Disp: , Rfl:  .  albuterol (PROVENTIL) (5 MG/ML) 0.5% nebulizer solution, Take 0.5 mLs (2.5 mg total) by nebulization every 4 (four) hours as needed for wheezing or shortness of breath., Disp: 20 mL,  Rfl: 0 .  allopurinol (ZYLOPRIM) 100 MG tablet, Take 1 tablet (100 mg total) by mouth daily., Disp: 90 tablet, Rfl: 3 .  aspirin EC 81 MG tablet, Take 81 mg by mouth daily., Disp: , Rfl:  .  budesonide-formoterol (SYMBICORT) 80-4.5 MCG/ACT inhaler, Inhale 2 puffs 2 (two) times daily into the lungs., Disp: 1 Inhaler, Rfl: 11 .  celecoxib (CELEBREX) 200 MG capsule, TAKE 1 CAPSULE BY MOUTH TWICE DAILY, Disp: 180 capsule, Rfl: 0 .  Cholecalciferol (VITAMIN D3 PO), Take 1 capsule by mouth daily., Disp: , Rfl:  .  cyanocobalamin 500 MCG tablet, Take 500 mcg by mouth daily.  , Disp: , Rfl:  .  cyclobenzaprine (FLEXERIL) 5 MG tablet, Take 1 tablet (5 mg total) at bedtime by mouth., Disp: 7 tablet, Rfl: 0 .  diazepam (VALIUM) 5 MG tablet, Take 1-2 tablets (5-10 mg total) every 6 (six) hours as needed by mouth for muscle spasms., Disp: 30 tablet, Rfl: 0 .  EPINEPHrine (EPIPEN) 0.3 mg/0.3 mL SOAJ injection, Inject 0.3 mg into the muscle once., Disp: , Rfl:  .  furosemide (LASIX) 20 MG tablet, Take 1 tablet (20 mg total) by mouth daily., Disp: 90 tablet, Rfl: 3 .  gabapentin (NEURONTIN) 300 MG capsule, Take 1 capsule (300 mg  total) by mouth at bedtime., Disp: 90 capsule, Rfl: 1 .  levocetirizine (XYZAL) 5 MG tablet, Take 1 tablet (5 mg total) by mouth every evening., Disp: 90 tablet, Rfl: 1 .  levocetirizine (XYZAL) 5 MG tablet, TAKE 1 TABLET BY MOUTH EVERY EVENING., Disp: 30 tablet, Rfl: 3 .  magnesium oxide (MAG-OX) 400 (241.3 Mg) MG tablet, Take 1 tablet (400 mg total) by mouth daily., Disp: 30 tablet, Rfl: 1 .  memantine (NAMENDA) 10 MG tablet, Take 1 tablet (10 mg total) by mouth 2 (two) times daily. (Patient taking differently: Take 20 mg by mouth daily. ), Disp: 180 tablet, Rfl: 3 .  metFORMIN (GLUCOPHAGE) 500 MG tablet, TAKE 1 TABLET BY MOUTH TWICE DAILY WITH A MEAL, Disp: 180 tablet, Rfl: 0 .  metoprolol tartrate (LOPRESSOR) 25 MG tablet, Take 1 tablet (25 mg total) by mouth daily., Disp: 90 tablet,  Rfl: 3 .  mometasone (NASONEX) 50 MCG/ACT nasal spray, Place 2 sprays into the nose daily., Disp: 51 g, Rfl: 2 .  Multiple Vitamins-Minerals (ONE-A-DAY WEIGHT SMART ADVANCE PO), Take 1 tablet by mouth daily. , Disp: , Rfl:  .  oxyCODONE-acetaminophen (PERCOCET) 10-325 MG tablet, Take 0.5-1 tablets every 4 (four) hours as needed by mouth for pain., Disp: 40 tablet, Rfl: 0 .  pantoprazole (PROTONIX) 40 MG tablet, Take 1 tablet (40 mg total) by mouth 2 (two) times daily., Disp: 180 tablet, Rfl: 3 .  potassium chloride SA (K-DUR,KLOR-CON) 20 MEQ tablet, Take 2 tablets (40 mEq total) by mouth daily., Disp: 90 tablet, Rfl: 1 .  pravastatin (PRAVACHOL) 10 MG tablet, Take 1 tablet (10 mg total) by mouth daily., Disp: 90 tablet, Rfl: 3 .  PROAIR HFA 108 (90 Base) MCG/ACT inhaler, Inhale 1 puff into the lungs every 6 (six) hours as needed for wheezing or shortness of breath. , Disp: , Rfl:  .  rivaroxaban (XARELTO) 20 MG TABS tablet, Take 1 tablet (20 mg total) by mouth daily., Disp: 90 tablet, Rfl: 1 .  terazosin (HYTRIN) 5 MG capsule, TAKE 1 CAPSULE BY MOUTH ONCE AT BEDTIME (Patient taking differently: Take 5 mg by mouth at bedtime. ), Disp: 30 capsule, Rfl: 0 .  terazosin (HYTRIN) 5 MG capsule, TAKE 1 CAPSULE BY MOUTH ONCE AT BEDTIME, Disp: 90 capsule, Rfl: 3 .  topiramate (TOPAMAX) 25 MG tablet, Take 1 tablet (25 mg total) by mouth 3 (three) times daily., Disp: 270 tablet, Rfl: 3 .  verapamil (CALAN-SR) 240 MG CR tablet, TAKE 1 TABLET BY MOUTH AT BEDTIME. (Patient taking differently: TAKE 240 MG BY MOUTH AT BEDTIME.), Disp: 90 tablet, Rfl: 1  Allergies:  Allergies  Allergen Reactions  . Bee Venom Anaphylaxis  . Garlic Swelling    Past Medical History, Surgical history, Social history, and Family History were reviewed and updated.  Review of Systems: As stated in the interim history  Physical Exam:  weight is 229 lb (103.9 kg). His oral temperature is 97.8 F (36.6 C). His blood pressure is  122/70 and his pulse is 81. His respiration is 20 and oxygen saturation is 100%.   Wt Readings from Last 3 Encounters:  11/17/17 229 lb (103.9 kg)  11/06/17 232 lb 9.6 oz (105.5 kg)  10/28/17 231 lb 13.9 oz (105.2 kg)     Head and neck exam shows no ocular or oral lesions.  There are no palpable cervical or supra clavicular lymph nodes.  Lungs are clear bilaterally.  He has no friction rub.  He has no wheezes.  Cardiac  exam regular rate and rhythm with no murmurs, rubs or bruits.  Abdomen is soft.  He has good bowel sounds.  There is no fluid wave.  There is no palpable liver or spleen tip.  He is moderately obese.  Back exam shows no tenderness over the spine.  He is wearing the back brace.  I do not have him take this off.  Extremities shows no clubbing, cyanosis or edema.  No venous cord is noted in his legs.  He has a negative Homans sign bilaterally.  He has good strength in his extremities.  He has good pulses in his distal extremities.  Skin exam shows no rashes, ecchymoses or petechia.  Neurological exam shows no focal neurological deficits.    Lab Results  Component Value Date   WBC 9.1 11/17/2017   HGB 11.5 (L) 11/17/2017   HCT 35.6 (L) 11/17/2017   MCV 92 11/17/2017   PLT 376 11/17/2017     Chemistry      Component Value Date/Time   NA 140 10/21/2017 0851   NA 141 05/13/2017 1001   K 3.7 10/21/2017 0851   K 3.9 05/13/2017 1001   CL 110 10/21/2017 0851   CL 108 05/13/2017 1001   CO2 20 (L) 10/21/2017 0851   CO2 25 05/13/2017 1001   BUN 14 10/21/2017 0851   BUN 14 05/13/2017 1001   CREATININE 1.14 10/21/2017 0851   CREATININE 1.3 (H) 05/13/2017 1001      Component Value Date/Time   CALCIUM 9.4 10/21/2017 0851   CALCIUM 9.4 05/13/2017 1001   ALKPHOS 92 (H) 05/13/2017 1001   AST 33 05/13/2017 1001   ALT 46 05/13/2017 1001   BILITOT 0.60 05/13/2017 1001         Impression and Plan: Garrett [name] is a 66 year old gentleman with bilateral pulmonary emboli.  He is  positive for the lupus anticoagulant.  I am glad that he got through his spine surgery without any bleeding.  He really looks fantastic.  I will now get him on 10 mg of Xarelto as maintenance.  I will keep him on maintenance Xarelto for 1 year.  I will not change the dose of aspirin for right now.  I think we can get him back in 6 months.  He is done so well.  I do not see a need for any scans or Dopplers when we see him back.  I spent about 25 minutes with him.  I was not aware of the surgery.  I wanted to make sure that all went well with surgery without any bleeding.  [name], MD 11/26/201810:37 AM

## 2017-11-18 LAB — D-DIMER, QUANTITATIVE: D-DIMER: 3.89 mg/L FEU — ABNORMAL HIGH (ref 0.00–0.49)

## 2017-11-24 ENCOUNTER — Encounter: Payer: Self-pay | Admitting: Hematology & Oncology

## 2017-11-24 ENCOUNTER — Other Ambulatory Visit: Payer: Self-pay | Admitting: Family

## 2017-11-24 DIAGNOSIS — I2699 Other pulmonary embolism without acute cor pulmonale: Secondary | ICD-10-CM

## 2017-11-24 MED FILL — POTASSIUM CL ER 20 MEQ TAB: 20 | 45 days supply | Qty: 90 | Fill #0

## 2017-11-24 MED FILL — LEVOCETIRIZINE 5 MG TABLET: 5 | 30 days supply | Qty: 30 | Fill #1

## 2017-11-25 MED FILL — CELECOXIB 200 MG CAPS: 200 | 90 days supply | Qty: 180 | Fill #0

## 2017-12-03 DIAGNOSIS — M4310 Spondylolisthesis, site unspecified: Secondary | ICD-10-CM | POA: Diagnosis not present

## 2017-12-04 ENCOUNTER — Other Ambulatory Visit (HOSPITAL_BASED_OUTPATIENT_CLINIC_OR_DEPARTMENT_OTHER): Payer: PPO

## 2017-12-04 DIAGNOSIS — I2699 Other pulmonary embolism without acute cor pulmonale: Secondary | ICD-10-CM | POA: Diagnosis not present

## 2017-12-04 DIAGNOSIS — D6862 Lupus anticoagulant syndrome: Secondary | ICD-10-CM | POA: Diagnosis not present

## 2017-12-04 LAB — CMP (CANCER CENTER ONLY)
ALBUMIN: 3.4 g/dL (ref 3.3–5.5)
ALK PHOS: 103 U/L — AB (ref 26–84)
ALT: 51 U/L — AB (ref 10–47)
AST: 32 U/L (ref 11–38)
BILIRUBIN TOTAL: 0.5 mg/dL (ref 0.20–1.60)
BUN, Bld: 11 mg/dL (ref 7–22)
CALCIUM: 8.9 mg/dL (ref 8.0–10.3)
CO2: 24 mEq/L (ref 18–33)
Chloride: 105 mEq/L (ref 98–108)
Creat: 1.1 mg/dl (ref 0.6–1.2)
GLUCOSE: 135 mg/dL — AB (ref 73–118)
POTASSIUM: 3.4 meq/L (ref 3.3–4.7)
Sodium: 146 mEq/L — ABNORMAL HIGH (ref 128–145)
TOTAL PROTEIN: 6.9 g/dL (ref 6.4–8.1)

## 2017-12-04 LAB — CBC WITH DIFFERENTIAL (CANCER CENTER ONLY)
BASO#: 0 10*3/uL (ref 0.0–0.2)
BASO%: 0.5 % (ref 0.0–2.0)
EOS ABS: 0.5 10*3/uL (ref 0.0–0.5)
EOS%: 5.4 % (ref 0.0–7.0)
HEMATOCRIT: 34 % — AB (ref 38.7–49.9)
HEMOGLOBIN: 10.9 g/dL — AB (ref 13.0–17.1)
LYMPH#: 1.6 10*3/uL (ref 0.9–3.3)
LYMPH%: 19.2 % (ref 14.0–48.0)
MCH: 29.3 pg (ref 28.0–33.4)
MCHC: 32.1 g/dL (ref 32.0–35.9)
MCV: 91 fL (ref 82–98)
MONO#: 0.5 10*3/uL (ref 0.1–0.9)
MONO%: 5.7 % (ref 0.0–13.0)
NEUT#: 5.8 10*3/uL (ref 1.5–6.5)
NEUT%: 69.2 % (ref 40.0–80.0)
Platelets: 232 10*3/uL (ref 145–400)
RBC: 3.72 10*6/uL — ABNORMAL LOW (ref 4.20–5.70)
RDW: 14.4 % (ref 11.1–15.7)
WBC: 8.4 10*3/uL (ref 4.0–10.0)

## 2017-12-06 LAB — LUPUS ANTICOAGULANT PANEL
DRVVT CONFIRM: 1.7 ratio — AB (ref 0.8–1.2)
DRVVT MIX: 69.2 s — AB (ref 0.0–47.0)
PTT-LA: 41.2 s (ref 0.0–51.9)
dRVVT: 101.7 s — ABNORMAL HIGH (ref 0.0–47.0)

## 2017-12-08 MED FILL — TERAZOSIN 5 MG CAPSULE: 5 | 90 days supply | Qty: 90 | Fill #0

## 2017-12-08 MED FILL — METOPROLOL TARTRATE 25 MG T: 25 | 90 days supply | Qty: 90 | Fill #3

## 2017-12-12 ENCOUNTER — Encounter: Payer: Self-pay | Admitting: Internal Medicine

## 2017-12-12 ENCOUNTER — Ambulatory Visit: Payer: PPO | Admitting: Internal Medicine

## 2017-12-12 VITALS — BP 140/80 | HR 74 | Ht 69.0 in | Wt 235.6 lb

## 2017-12-12 DIAGNOSIS — G4733 Obstructive sleep apnea (adult) (pediatric): Secondary | ICD-10-CM

## 2017-12-12 DIAGNOSIS — R918 Other nonspecific abnormal finding of lung field: Secondary | ICD-10-CM

## 2017-12-12 DIAGNOSIS — R05 Cough: Secondary | ICD-10-CM | POA: Diagnosis not present

## 2017-12-12 DIAGNOSIS — J45991 Cough variant asthma: Secondary | ICD-10-CM | POA: Diagnosis not present

## 2017-12-12 DIAGNOSIS — R058 Other specified cough: Secondary | ICD-10-CM

## 2017-12-12 NOTE — Progress Notes (Signed)
Subjective:    Patient ID: Garrett [name], male    DOB: March 17, 1951,   MRN: [id number]   Brief patient profile:  11 yowm quit smoking 1992 and exposed to dust in [place] until age of 28  no respiratory problems except for OSA until fall 2016 with recurrent coughing/ sob/ wheezing since then already rx with Symbicort/ saba so referred to pulmonary clinic 03/21/2016 by Dr Etter Sjogren with abn ct chest.   History of Present Illness  03/21/2016 1st  Pulmonary office visit/ Garrett [name]  On maint symbicort 160  Chief Complaint  Patient presents with  . Pulmonary Consult    Referred by Dr. Etter Sjogren. Pt states he had PNA recently and has had cough and SOB for the past few months- esp worse over the past 6 wks. He gets SOB walking short distances such as to his mailbox. His cough is prod at times with yellow/green sputum.    prior to 6 m prior to OV  Back and forth mb fine/ no need for any inhalers but breathing never right since, best rx is prednisone no def resp to symbicort or saba  rec Stop Altace and start avapro  150 mg  One half daily - take a whole if blood pressure too high Pantoprazole Take 30- 60 min before your first and last meals of the day  GERD diet  If get worse > Prednisone 10 mg take  4 each am x 2 days,   2 each am x 2 days,  1 each am x 2 days and stop     05/02/2016  f/u ov/Oland Arquette re:  prob acei uacs / mpns  Chief Complaint  Patient presents with  . Follow-up    Breathing is much improved- almost back to his normal baseline. He is no longer coughing. Has not had to use albuterol.    improved p 2 weeks off acei and now Not limited by breathing from desired activities   rec We will call you for follow up ct in Sept 2017> no change in old nodules, one new only 6 mm > rec 12 m f/u     02/03/2017  Acute extended  ov/Renell Allum re: new sob  Chief Complaint  Patient presents with  . Follow-up    upper airway cough syndrome, not having much cough but still struggles with dyspnea with exertion    acute ill on a 01/17/17  with fever chills and sob and minimal cough dry > UC same Sunday 01/19/17 Rocephin /levaquin 500 x 10 days and pred rec  Then doxy still has one day left but breathing but sob x 50 fts  Baseline = slower than nl = MMRC2 = can't walk a nl pace on a flat grade s sob but does fine slow and flat eg walking at mall  Not on any kind of regular use inhaler - symbicort listed but not taking Now on neb and it too does not really help his sob Taking ppi but not ac       No noct symptoms at all  rec Protonix Take 30- 60 min before your first and last meals of the day  GERD diet  CTa 02/04/17 > pos PE > xarelto     02/10/2017  Post hosp f/u ov/transition of care / Garrett [name] re: doe / dx of PE/ not using symb very rare need for saba/ never neb  Chief Complaint  Patient presents with  . Follow-up    F/u for CT results. Breathing has  been getting better since last OV.   Not limited by breathing from desired activities  But Relatively sedentary. rec Only use your albuterol as a rescue medication   - Ok to use up to 2 puffs  every 4 hours  But if you start needing it for any reason more than you do now then you need to restart regular use of symbicort 80 Take 2 puffs first thing in am and then another 2 puffs about 12 hours later.      03/07/17 NP rec Continue using your Albuterol Nebs and rescue inhaler as needed for shortness of breath or wheezing. Continue on CPAP at bedtime. You appear to be benefiting from the treatment Goal is to wear for at least 4-6 hours each night for maximal clinical benefit. Continue to work on weight loss, as the link between excess weight  and sleep apnea is well established.  Do not drive if sleepy. Follow up with Dr. Melvyn Novas In 3  Months. or before as needed.  Keep August Appointment ( This appointment is for lung nodules) Continue taking Xarelto 20 mg daily.    06/17/2017  f/u ov/Alinda Egolf re: unexplained sob s/p remote pe maint on xarelto / on  multiple inhalers with poor insight  Chief Complaint  Patient presents with  . Follow-up    Pt states he had a bad wk last wk with his breathing- relates to humid weather. He states he was using atrovent and proair both every day. He uses neb once per wk on average. He has occ dry cough.   woke up nl hour with cough and sob rx  Variably sob with activity, "atrovent works the best"  Worse sob/ cough out in heat typically sleeps fine but freq am flares of cough/sob  rec Plan A = Automatic = symbicort 80 Take 2 puffs first thing in am and then another 2 puffs about 12 hours later.  Work on inhaler technique:  Plan B = Backup Only use your albuterol (PROAIR)as a rescue medication  Plan C = Crisis - only use your albuterol nebulizer if you first try Plan B and it fails to help > ok to use the nebulizer up to every 4 hours but if start needing it regularly call for immediate appointment Continue protonix (pantoprazole) 40 mg Take 30- 60 min before your first and last meals of the day and add Pepcid ac 20 mg at bedtime     12/12/2017  f/u ov/Garrett [name] re: cough variant asthma vs uacs  Chief Complaint  Patient presents with  . Follow-up    Pt states he has been doing pretty well since last visit. Does have complaints of occ. cough, SOB with exertion. Denies any CP.   sleeping fine minimal elevation / on cpap  Per [facility] / [facility]  More limited by back than breathing at this point / min daytime cough  No obvious day to day or daytime variability or assoc excess/ purulent sputum or mucus plugs or hemoptysis or cp or chest tightness, subjective wheeze or overt sinus or hb symptoms. No unusual exposure hx or h/o childhood pna/ asthma or knowledge of premature birth.  Sleeping ok flat without nocturnal  or early am exacerbation  of respiratory  c/o's or need for noct saba. Also denies any obvious fluctuation of symptoms with weather or environmental changes or other aggravating or alleviating factors except  as outlined above   Current Allergies, Complete Past Medical History, Past Surgical History, Family History, and Social History were  reviewed in [facility] record.  ROS  The following are not active complaints unless bolded Hoarseness, sore throat, dysphagia, dental problems, itching, sneezing,  nasal congestion or discharge of excess mucus or purulent secretions, ear ache,   fever, chills, sweats, unintended wt loss or wt gain, classically pleuritic or exertional cp,  orthopnea pnd or leg swelling, presyncope, palpitations, abdominal pain, anorexia, nausea, vomiting, diarrhea  or change in bowel habits or change in bladder habits, change in stools or change in urine, dysuria, hematuria,  rash, arthralgias, visual complaints, headache, numbness, weakness or ataxia or problems with walking or coordination,  change in mood/affect or memory.        Current Meds  Medication Sig  . acidophilus (RISAQUAD) CAPS capsule Take 1 capsule by mouth at bedtime.   Marland Kitchen albuterol (PROVENTIL) (5 MG/ML) 0.5% nebulizer solution Take 2.5 mg by nebulization every 6 (six) hours as needed for wheezing or shortness of breath.  . allopurinol (ZYLOPRIM) 100 MG tablet Take 1 tablet (100 mg total) by mouth daily.  Marland Kitchen aspirin EC 81 MG tablet Take 81 mg by mouth daily.  . budesonide-formoterol (SYMBICORT) 80-4.5 MCG/ACT inhaler Inhale 2 puffs 2 (two) times daily into the lungs.  . celecoxib (CELEBREX) 200 MG capsule TAKE 1 CAPSULE BY MOUTH TWICE DAILY  . cyanocobalamin 500 MCG tablet Take 500 mcg by mouth daily.    . cyclobenzaprine (FLEXERIL) 5 MG tablet Take 1 tablet (5 mg total) at bedtime by mouth.  . EPINEPHrine (EPIPEN) 0.3 mg/0.3 mL SOAJ injection Inject 0.3 mg into the muscle once.  . furosemide (LASIX) 20 MG tablet Take 1 tablet (20 mg total) by mouth daily.  Marland Kitchen gabapentin (NEURONTIN) 300 MG capsule Take 1 capsule (300 mg total) by mouth at bedtime.  Marland Kitchen levocetirizine (XYZAL) 5 MG tablet Take 1  tablet (5 mg total) by mouth every evening.  . magnesium oxide (MAG-OX) 400 (241.3 Mg) MG tablet Take 1 tablet (400 mg total) by mouth daily.  . memantine (NAMENDA) 10 MG tablet Take 1 tablet (10 mg total) by mouth 2 (two) times daily. (Patient taking differently: Take 20 mg by mouth daily. )  . metFORMIN (GLUCOPHAGE) 500 MG tablet TAKE 1 TABLET BY MOUTH TWICE DAILY WITH A MEAL  . metoprolol tartrate (LOPRESSOR) 25 MG tablet Take 1 tablet (25 mg total) by mouth daily.  . mometasone (NASONEX) 50 MCG/ACT nasal spray Place 2 sprays into the nose daily.  . Multiple Vitamins-Minerals (ONE-A-DAY WEIGHT SMART ADVANCE PO) Take 1 tablet by mouth daily.   . pantoprazole (PROTONIX) 40 MG tablet Take 1 tablet (40 mg total) by mouth 2 (two) times daily.  . potassium chloride SA (K-DUR,KLOR-CON) 20 MEQ tablet Take 2 tablets (40 mEq total) by mouth daily.  . pravastatin (PRAVACHOL) 10 MG tablet Take 1 tablet (10 mg total) by mouth daily.  Marland Kitchen PROAIR HFA 108 (90 Base) MCG/ACT inhaler Inhale 1 puff into the lungs every 6 (six) hours as needed for wheezing or shortness of breath.   . rivaroxaban (XARELTO) 10 MG TABS tablet Take 1 tablet (10 mg total) by mouth daily.  Marland Kitchen terazosin (HYTRIN) 5 MG capsule TAKE 1 CAPSULE BY MOUTH ONCE AT BEDTIME (Patient taking differently: Take 5 mg by mouth at bedtime. )  . topiramate (TOPAMAX) 25 MG tablet Take 1 tablet (25 mg total) by mouth 3 (three) times daily.  . verapamil (CALAN-SR) 240 MG CR tablet TAKE 1 TABLET BY MOUTH AT BEDTIME. (Patient taking differently: TAKE 240 MG BY MOUTH  AT BEDTIME.)                      Objective:   Physical Exam  amb wm, moves slowly he says due to  Back pain  12/12/2017      235 06/17/2017        231  02/10/2017        230  02/03/2017        229  05/02/2016        226  03/21/16 226 lb 3.2 oz (102.604 kg)  03/11/16 222 lb 6.4 oz (100.88 kg)  03/06/16 229 lb (103.874 kg)    Vital signs reviewed - Note on arrival 02 sats  98% on RA        HEENT: nl   turbinates, and oropharynx. Nl external ear canals without cough reflex - full set  Of dentures  Modified Mallampati Score =   2/3  HEENT: nl   turbinates bilaterally, and oropharynx. Nl external ear canals without cough reflex - full dentures Modified Mallampati Score =    2/3  NECK :  without JVD/Nodes/TM/ nl carotid upstrokes bilaterally   LUNGS: no acc muscle use,  Nl contour chest which is clear to A and P bilaterally without cough on insp or exp maneuvers   CV:  RRR  no s3 or murmur or increase in P2, and no edema   ABD:  Obese but soft and nontender with limited inspiratory excursion in the supine position. No bruits or organomegaly appreciated, bowel sounds nl  MS:  Nl gait/ ext warm without deformities, calf tenderness, cyanosis or clubbing No obvious joint restrictions   SKIN: warm and dry without lesions    NEURO:  alert, approp, nl sensorium with  no motor or cerebellar deficits apparent.                 Assessment & Plan:

## 2017-12-12 NOTE — Patient Instructions (Signed)
.  Plan A = Automatic =  Continue symbicort 80 Take 2 puffs first thing in am and then another 2 puffs about 12 hours later.    Plan B = Backup Only use your albuterol (PROAIR)as a rescue medication to be used if you can't catch your breath by resting or doing a relaxed purse lip breathing pattern.  - The less you use it, the better it will work when you need it. - Ok to use the inhaler up to 2 puffs  every 4 hours if you must but call for appointment if use goes up over your usual need - Don't leave home without it !!  (think of it like the spare tire for your car)   Plan C = Crisis - only use your albuterol nebulizer if you first try Plan B and it fails to help > ok to use the nebulizer up to every 4 hours but if start needing it regularly call for immediate appointment   Continue protonix (pantoprazole) 40 mg Take 30- 60 min before your first and last meals of the day and add Pepcid ac 20 mg at bedtime    We will set you up for follow up by Sleep medicine since Dr Gwenette Greet is not availabe > 6 months is fine    If you are satisfied with your treatment plan,  let your doctor know and he/she can either refill your medications or you can return here when your prescription runs out.     If in any way you are not 100% satisfied,  please tell us.  If 100% better, tell your friends!  Pulmonary follow up is as needed    .

## 2017-12-14 ENCOUNTER — Encounter: Payer: Self-pay | Admitting: Internal Medicine

## 2017-12-14 NOTE — Assessment & Plan Note (Signed)
All goals of chronic asthma control met including optimal function and elimination of symptoms with minimal need for rescue therapy.  Contingencies discussed in full including contacting this office immediately if not controlling the symptoms using the rule of two's.     Pulmonary f/u is prn

## 2017-12-14 NOTE — Assessment & Plan Note (Signed)
HST 12/2013:  AHI 6/hr.  On auto 5-20cm   F/u per sleep medicine to be arranged     I had an extended discussion with the patient reviewing all relevant studies completed to date and  lasting 15 to 20 minutes of a 25 minute visit    Each maintenance medication was reviewed in detail including most importantly the difference between maintenance and prns and under what circumstances the prns are to be triggered using an action plan format that is not reflected in the computer generated alphabetically organized AVS.    Please see AVS for specific instructions unique to this visit that I personally wrote and verbalized to the the pt in detail and then reviewed with pt  by my nurse highlighting any  changes in therapy recommended at today's visit to their plan of care.

## 2017-12-14 NOTE — Assessment & Plan Note (Signed)
See CT chest 03/15/16   - 09/16/2016 Primarily similar bilateral pulmonary nodules measuring maximally 8 mm. A left upper lobe pulmonary nodule measures 6 mm and is new. Given the new nodule and smoking history, followup at 6-12 months> REC F/U IN 09/16/17 as quit smoking 1992   - CTa chest 02/04/17 > Multiple stable pulmonary nodules. Two new adjacent nodules in the left lobe are likely inflammatory - CTa chest 05/13/17 Stable bilateral pulmonary nodules are noted. Follow-up CT scan in 12 months is recommended to ensure stability.> placed in tickle file for 05/13/18    Discussed in detail all the  indications, usual  risks and alternatives  relative to the benefits with patient who agrees to proceed with conservative f/u as outlined

## 2017-12-14 NOTE — Assessment & Plan Note (Signed)
Likely related to HH/ gerd > continue PPI and f/u pcp/ gi prn

## 2017-12-29 ENCOUNTER — Other Ambulatory Visit: Payer: Self-pay | Admitting: Family Medicine

## 2017-12-29 DIAGNOSIS — G8929 Other chronic pain: Secondary | ICD-10-CM

## 2017-12-29 MED FILL — PRAVASTATIN NA 10 MG TAB: 10 | 90 days supply | Qty: 90 | Fill #1

## 2017-12-29 MED FILL — LEVOCETIRIZINE 5 MG TABLET: 5 | 30 days supply | Qty: 30 | Fill #2

## 2017-12-29 MED FILL — GABAPENTIN 300 MG CAPSULE: 300 | 90 days supply | Qty: 90 | Fill #0

## 2017-12-29 MED FILL — TOPIRAMATE 25 MG TAB: 25 | 90 days supply | Qty: 270 | Fill #2

## 2017-12-29 MED FILL — ALLOPURINOL 100 MG TABS: 100 | 90 days supply | Qty: 90 | Fill #3

## 2017-12-29 MED FILL — XARELTO 10 MG TABLET: 10 | 30 days supply | Qty: 30 | Fill #1

## 2017-12-29 NOTE — Telephone Encounter (Signed)
Refill done. Pt needs to schedule appt for future refills.

## 2017-12-31 DIAGNOSIS — M4310 Spondylolisthesis, site unspecified: Secondary | ICD-10-CM | POA: Diagnosis not present

## 2018-01-12 MED FILL — POTASSIUM CL ER 20 MEQ TAB: 20 | 45 days supply | Qty: 90 | Fill #1

## 2018-01-12 MED FILL — FUROSEMIDE 20 MG TABS: 20 | 90 days supply | Qty: 90 | Fill #2

## 2018-01-12 MED FILL — MEMANTINE HCL 10 MG TABS: 10 | 90 days supply | Qty: 180 | Fill #3

## 2018-01-25 MED FILL — XARELTO 10 MG TABLET: 10 | 30 days supply | Qty: 30 | Fill #2

## 2018-01-25 MED FILL — PANTOPRAZOLE SOD DR 40 MG T: 40 | 90 days supply | Qty: 180 | Fill #2

## 2018-01-25 MED FILL — LEVOCETIRIZINE 5 MG TABLET: 5 | 30 days supply | Qty: 30 | Fill #3

## 2018-01-26 MED FILL — VERAPAMIL ER 240 MG TABLET: 240 | 90 days supply | Qty: 90 | Fill #1

## 2018-02-03 ENCOUNTER — Other Ambulatory Visit: Payer: Self-pay | Admitting: Family Medicine

## 2018-02-03 MED FILL — metFORMIN HCL 500 MG TABS: 500 | 90 days supply | Qty: 180 | Fill #0

## 2018-02-10 ENCOUNTER — Ambulatory Visit (HOSPITAL_BASED_OUTPATIENT_CLINIC_OR_DEPARTMENT_OTHER)
Admission: RE | Admit: 2018-02-10 | Discharge: 2018-02-10 | Disposition: A | Discharge status: Home / Self Care | Payer: PPO | Source: Ambulatory Visit | Attending: Family Medicine | Admitting: Family Medicine

## 2018-02-10 ENCOUNTER — Other Ambulatory Visit: Payer: Self-pay | Admitting: *Deleted

## 2018-02-10 ENCOUNTER — Encounter: Payer: Self-pay | Admitting: Neurology

## 2018-02-10 ENCOUNTER — Encounter: Payer: Self-pay | Admitting: Family Medicine

## 2018-02-10 ENCOUNTER — Ambulatory Visit (INDEPENDENT_AMBULATORY_CARE_PROVIDER_SITE_OTHER): Payer: PPO | Admitting: Family Medicine

## 2018-02-10 ENCOUNTER — Encounter: Payer: PPO | Admitting: Family Medicine

## 2018-02-10 VITALS — BP 130/76 | HR 72 | Temp 97.8°F | Resp 16 | Ht 69.0 in | Wt 239.0 lb

## 2018-02-10 DIAGNOSIS — E1165 Type 2 diabetes mellitus with hyperglycemia: Secondary | ICD-10-CM

## 2018-02-10 DIAGNOSIS — Z9889 Other specified postprocedural states: Secondary | ICD-10-CM | POA: Insufficient documentation

## 2018-02-10 DIAGNOSIS — R972 Elevated prostate specific antigen [PSA]: Secondary | ICD-10-CM

## 2018-02-10 DIAGNOSIS — I10 Essential (primary) hypertension: Secondary | ICD-10-CM | POA: Diagnosis not present

## 2018-02-10 DIAGNOSIS — M48062 Spinal stenosis, lumbar region with neurogenic claudication: Secondary | ICD-10-CM | POA: Diagnosis not present

## 2018-02-10 DIAGNOSIS — M549 Dorsalgia, unspecified: Secondary | ICD-10-CM

## 2018-02-10 DIAGNOSIS — E785 Hyperlipidemia, unspecified: Secondary | ICD-10-CM | POA: Diagnosis not present

## 2018-02-10 DIAGNOSIS — Z0000 Encounter for general adult medical examination without abnormal findings: Secondary | ICD-10-CM

## 2018-02-10 DIAGNOSIS — E1151 Type 2 diabetes mellitus with diabetic peripheral angiopathy without gangrene: Secondary | ICD-10-CM | POA: Diagnosis not present

## 2018-02-10 DIAGNOSIS — M47816 Spondylosis without myelopathy or radiculopathy, lumbar region: Secondary | ICD-10-CM | POA: Insufficient documentation

## 2018-02-10 DIAGNOSIS — R42 Dizziness and giddiness: Secondary | ICD-10-CM

## 2018-02-10 DIAGNOSIS — M545 Low back pain: Secondary | ICD-10-CM | POA: Diagnosis not present

## 2018-02-10 DIAGNOSIS — E1169 Type 2 diabetes mellitus with other specified complication: Secondary | ICD-10-CM

## 2018-02-10 DIAGNOSIS — I70208 Unspecified atherosclerosis of native arteries of extremities, other extremity: Secondary | ICD-10-CM | POA: Insufficient documentation

## 2018-02-10 DIAGNOSIS — E1121 Type 2 diabetes mellitus with diabetic nephropathy: Secondary | ICD-10-CM | POA: Diagnosis not present

## 2018-02-10 DIAGNOSIS — IMO0002 Reserved for concepts with insufficient information to code with codable children: Secondary | ICD-10-CM

## 2018-02-10 LAB — COMPREHENSIVE METABOLIC PANEL
ALT: 45 U/L (ref 0–53)
AST: 27 U/L (ref 0–37)
Albumin: 4.1 g/dL (ref 3.5–5.2)
Alkaline Phosphatase: 77 U/L (ref 39–117)
BILIRUBIN TOTAL: 0.4 mg/dL (ref 0.2–1.2)
BUN: 16 mg/dL (ref 6–23)
CO2: 25 meq/L (ref 19–32)
Calcium: 9.6 mg/dL (ref 8.4–10.5)
Chloride: 107 mEq/L (ref 96–112)
Creatinine, Ser: 1.07 mg/dL (ref 0.40–1.50)
GFR: 73.08 mL/min (ref >60.00)
GLUCOSE: 105 mg/dL — AB (ref 70–99)
Potassium: 4.1 mEq/L (ref 3.5–5.1)
Sodium: 140 mEq/L (ref 135–145)
Total Protein: 6.8 g/dL (ref 6.0–8.3)

## 2018-02-10 LAB — CBC WITH DIFFERENTIAL/PLATELET
BASOS ABS: 0 10*3/uL (ref 0.0–0.1)
Basophils Relative: 0.6 % (ref 0.0–3.0)
EOS ABS: 0.2 10*3/uL (ref 0.0–0.7)
Eosinophils Relative: 2 % (ref 0.0–5.0)
HCT: 36.2 % — ABNORMAL LOW (ref 39.0–52.0)
Hemoglobin: 11.6 g/dL — ABNORMAL LOW (ref 13.0–17.0)
LYMPHS ABS: 1.6 10*3/uL (ref 0.7–4.0)
Lymphocytes Relative: 19 % (ref 12.0–46.0)
MCHC: 32 g/dL (ref 30.0–36.0)
MCV: 84 fl (ref 78.0–100.0)
Monocytes Absolute: 0.6 10*3/uL (ref 0.1–1.0)
Monocytes Relative: 7.4 % (ref 3.0–12.0)
NEUTROS ABS: 5.8 10*3/uL (ref 1.4–7.7)
NEUTROS PCT: 71 % (ref 43.0–77.0)
PLATELETS: 266 10*3/uL (ref 150.0–400.0)
RBC: 4.31 Mil/uL (ref 4.22–5.81)
RDW: 15.9 % — ABNORMAL HIGH (ref 11.5–15.5)
WBC: 8.2 10*3/uL (ref 4.0–10.5)

## 2018-02-10 LAB — LIPID PANEL
CHOL/HDL RATIO: 4
Cholesterol: 165 mg/dL (ref 0–200)
HDL: 41.4 mg/dL (ref >39.00)
LDL Cholesterol: 92 mg/dL (ref 0–99)
NONHDL: 123.85
Triglycerides: 161 mg/dL — ABNORMAL HIGH (ref 0.0–149.0)
VLDL: 32.2 mg/dL (ref 0.0–40.0)

## 2018-02-10 LAB — MICROALBUMIN / CREATININE URINE RATIO
CREATININE, U: 22.9 mg/dL
MICROALB/CREAT RATIO: 3.1 mg/g (ref 0.0–30.0)
Microalb, Ur: <0.7 mg/dL (ref 0.0–1.9)

## 2018-02-10 LAB — HEMOGLOBIN A1C: HEMOGLOBIN A1C: 6.6 % — AB (ref 4.6–6.5)

## 2018-02-10 LAB — PSA: PSA: 1.05 ng/mL (ref 0.10–4.00)

## 2018-02-10 MED ORDER — METFORMIN HCL 1000 MG PO TABS: 1000.0000 mg | ORAL_TABLET | Freq: Two times a day (BID) | ORAL | 3 refills | Status: DC

## 2018-02-10 NOTE — Patient Instructions (Signed)
Preventive Care 68 Years and Older, Male Preventive care refers to lifestyle choices and visits with your health care provider that can promote health and wellness. What does preventive care include?  A yearly physical exam. This is also called an annual well check.  Dental exams once or twice a year.  Routine eye exams. Ask your health care provider how often you should have your eyes checked.  Personal lifestyle choices, including: ? Daily care of your teeth and gums. ? Regular physical activity. ? Eating a healthy diet. ? Avoiding tobacco and drug use. ? Limiting alcohol use. ? Practicing safe sex. ? Taking low doses of aspirin every day. ? Taking vitamin and mineral supplements as recommended by your health care provider. What happens during an annual well check? The services and screenings done by your health care provider during your annual well check will depend on your age, overall health, lifestyle risk factors, and family history of disease. Counseling Your health care provider may ask you questions about your:  Alcohol use.  Tobacco use.  Drug use.  Emotional well-being.  Home and relationship well-being.  Sexual activity.  Eating habits.  History of falls.  Memory and ability to understand (cognition).  Work and work environment.  Screening You may have the following tests or measurements:  Height, weight, and BMI.  Blood pressure.  Lipid and cholesterol levels. These may be checked every 5 years, or more frequently if you are over 50 years old.  Skin check.  Lung cancer screening. You may have this screening every year starting at age 55 if you have a 30-pack-year history of smoking and currently smoke or have quit within the past 15 years.  Fecal occult blood test (FOBT) of the stool. You may have this test every year starting at age 50.  Flexible sigmoidoscopy or colonoscopy. You may have a sigmoidoscopy every 5 years or a colonoscopy every 10  years starting at age 50.  Prostate cancer screening. Recommendations will vary depending on your family history and other risks.  Hepatitis C blood test.  Hepatitis B blood test.  Sexually transmitted disease (STD) testing.  Diabetes screening. This is done by checking your blood sugar (glucose) after you have not eaten for a while (fasting). You may have this done every 1-3 years.  Abdominal aortic aneurysm (AAA) screening. You may need this if you are a current or former smoker.  Osteoporosis. You may be screened starting at age 70 if you are at high risk.  Talk with your health care provider about your test results, treatment options, and if necessary, the need for more tests. Vaccines Your health care provider may recommend certain vaccines, such as:  Influenza vaccine. This is recommended every year.  Tetanus, diphtheria, and acellular pertussis (Tdap, Td) vaccine. You may need a Td booster every 10 years.  Varicella vaccine. You may need this if you have not been vaccinated.  Zoster vaccine. You may need this after age 60.  Measles, mumps, and rubella (MMR) vaccine. You may need at least one dose of MMR if you were born in 1957 or later. You may also need a second dose.  Pneumococcal 13-valent conjugate (PCV13) vaccine. One dose is recommended after age 65.  Pneumococcal polysaccharide (PPSV23) vaccine. One dose is recommended after age 65.  Meningococcal vaccine. You may need this if you have certain conditions.  Hepatitis A vaccine. You may need this if you have certain conditions or if you travel or work in places where you   may be exposed to hepatitis A.  Hepatitis B vaccine. You may need this if you have certain conditions or if you travel or work in places where you may be exposed to hepatitis B.  Haemophilus influenzae type b (Hib) vaccine. You may need this if you have certain risk factors.  Talk to your health care provider about which screenings and vaccines  you need and how often you need them. This information is not intended to replace advice given to you by your health care provider. Make sure you discuss any questions you have with your health care provider. Document Released: 01/05/2016 Document Revised: 08/28/2016 Document Reviewed: 10/10/2015 Elsevier Interactive Patient Education  2018 [employer]  

## 2018-02-10 NOTE — Assessment & Plan Note (Signed)
Well controlled, no changes to meds. Encouraged heart healthy diet such as the DASH diet and exercise as tolerated.  °

## 2018-02-10 NOTE — Assessment & Plan Note (Signed)
ghm utd Check labs  See  Avs
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
## 2018-02-10 NOTE — Assessment & Plan Note (Signed)
Tolerating statin, encouraged heart healthy diet, avoid trans fats, minimize simple carbs and saturated fats. Increase exercise as tolerated 

## 2018-02-10 NOTE — Assessment & Plan Note (Signed)
Per NS Check xray due to recent fall  F/u NS

## 2018-02-10 NOTE — Assessment & Plan Note (Signed)
?   Etiology Pt states hearing aids just checked by audiologist and hearing seems good now with hearing aids Refer to neuro

## 2018-02-10 NOTE — Assessment & Plan Note (Signed)
Check labs  con't meds  hgba1c to be checked , minimize simple carbs. Increase exercise as tolerated. Continue current meds 

## 2018-02-11 ENCOUNTER — Other Ambulatory Visit: Payer: Self-pay | Admitting: Family Medicine

## 2018-02-11 MED FILL — ONE TOUCH ULTRA TEST STRIPS: 33 days supply | Qty: 100 | Fill #0

## 2018-02-16 ENCOUNTER — Other Ambulatory Visit: Payer: Self-pay | Admitting: Family Medicine

## 2018-02-16 DIAGNOSIS — D500 Iron deficiency anemia secondary to blood loss (chronic): Secondary | ICD-10-CM

## 2018-02-16 DIAGNOSIS — I2699 Other pulmonary embolism without acute cor pulmonale: Secondary | ICD-10-CM

## 2018-02-16 MED FILL — diazePAM 5 MG TABS: 5 | 4 days supply | Qty: 30 | Fill #0

## 2018-02-16 MED FILL — CELECOXIB 200 MG CAPSULE: 200 | 90 days supply | Qty: 180 | Fill #0

## 2018-02-23 MED FILL — XARELTO 10 MG TABLET: 10 | 30 days supply | Qty: 30 | Fill #3

## 2018-02-24 ENCOUNTER — Other Ambulatory Visit: Payer: Self-pay | Admitting: Family Medicine

## 2018-02-24 DIAGNOSIS — R05 Cough: Secondary | ICD-10-CM

## 2018-02-24 DIAGNOSIS — R059 Cough, unspecified: Secondary | ICD-10-CM

## 2018-02-24 MED FILL — POTASSIUM CL ER 20 MEQ TABL: 20 | 45 days supply | Qty: 90 | Fill #0

## 2018-02-24 MED FILL — LEVOCETIRIZINE 5 MG TABLET: 5 | 30 days supply | Qty: 30 | Fill #0

## 2018-03-02 MED FILL — TERAZOSIN 5 MG CAPSULE: 5 | 90 days supply | Qty: 90 | Fill #1

## 2018-03-03 ENCOUNTER — Other Ambulatory Visit: Payer: Self-pay | Admitting: Family Medicine

## 2018-03-03 DIAGNOSIS — I10 Essential (primary) hypertension: Secondary | ICD-10-CM

## 2018-03-03 MED FILL — METOPROLOL TARTRATE 25 MG T: 25 | 90 days supply | Qty: 90 | Fill #0

## 2018-03-16 MED FILL — metFORMIN HCL 1000 MG TABS: 1000 | 90 days supply | Qty: 180 | Fill #0

## 2018-03-18 ENCOUNTER — Encounter: Payer: Self-pay | Admitting: Family Medicine

## 2018-03-18 DIAGNOSIS — R42 Dizziness and giddiness: Secondary | ICD-10-CM

## 2018-03-19 NOTE — Telephone Encounter (Signed)
Ok to refer.

## 2018-03-23 DIAGNOSIS — R2689 Other abnormalities of gait and mobility: Secondary | ICD-10-CM | POA: Diagnosis not present

## 2018-03-23 DIAGNOSIS — H8111 Benign paroxysmal vertigo, right ear: Secondary | ICD-10-CM | POA: Diagnosis not present

## 2018-03-24 ENCOUNTER — Other Ambulatory Visit: Payer: Self-pay | Admitting: Family Medicine

## 2018-03-24 DIAGNOSIS — G8929 Other chronic pain: Secondary | ICD-10-CM

## 2018-03-24 MED FILL — LEVOCETIRIZINE 5 MG TABLET: 5 | 30 days supply | Qty: 30 | Fill #1

## 2018-03-24 MED FILL — PRAVASTATIN SODIUM 10 MG TA: 10 | 90 days supply | Qty: 90 | Fill #2

## 2018-03-24 MED FILL — MOMETASONE FUROATE 50 MCG S: 50 | 90 days supply | Qty: 51 | Fill #1

## 2018-03-26 DIAGNOSIS — Z6834 Body mass index (BMI) 34.0-34.9, adult: Secondary | ICD-10-CM | POA: Diagnosis not present

## 2018-03-26 DIAGNOSIS — M4310 Spondylolisthesis, site unspecified: Secondary | ICD-10-CM | POA: Diagnosis not present

## 2018-03-26 DIAGNOSIS — I10 Essential (primary) hypertension: Secondary | ICD-10-CM | POA: Diagnosis not present

## 2018-03-30 ENCOUNTER — Other Ambulatory Visit: Payer: Self-pay | Admitting: Family Medicine

## 2018-03-30 DIAGNOSIS — M1000 Idiopathic gout, unspecified site: Secondary | ICD-10-CM

## 2018-03-30 MED FILL — XARELTO 10 MG TABLET: 10 | 30 days supply | Qty: 30 | Fill #4

## 2018-03-30 MED FILL — FUROSEMIDE 20 MG TABS: 20 | 90 days supply | Qty: 90 | Fill #3

## 2018-03-30 MED FILL — ALLOPURINOL 100 MG TABLET: 100 | 90 days supply | Qty: 90 | Fill #0

## 2018-03-30 MED FILL — TOPIRAMATE 25 MG TABLET: 25 | 90 days supply | Qty: 270 | Fill #3

## 2018-04-03 ENCOUNTER — Other Ambulatory Visit: Payer: Self-pay | Admitting: Family Medicine

## 2018-04-03 ENCOUNTER — Telehealth: Payer: Self-pay | Admitting: Family Medicine

## 2018-04-03 DIAGNOSIS — R42 Dizziness and giddiness: Secondary | ICD-10-CM

## 2018-04-03 MED ORDER — MECLIZINE HCL 25 MG PO TABS: 25.0000 mg | ORAL_TABLET | Freq: Three times a day (TID) | ORAL | 0 refills | Status: DC | PRN

## 2018-04-03 NOTE — Telephone Encounter (Signed)
Sent in  Ov if needs more -- since we had taken it off his med list

## 2018-04-03 NOTE — Telephone Encounter (Signed)
Spoke with pt. And he reports he had an old prescription for Meclizine, buy it has expired. Requesting Dr. Carollee Herter  Refill it for him.

## 2018-04-03 NOTE — Telephone Encounter (Signed)
Do you want him to come in.  End date on last rx was 2012.

## 2018-04-03 NOTE — Telephone Encounter (Signed)
Copied from [place] [id number]. Topic: Quick Communication - Rx Refill/Question >> Apr 03, 2018 12:12 PM Robina Ade, Helene Kelp D wrote: Medication:meclizine 25 mg Has the patient contacted their pharmacy? NO, its an old med rx (Agent: If no, request that the patient contact the pharmacy for the refill.) Preferred Pharmacy (with phone number or street name): [place], [place] Agent: Please be advised that RX refills may take up to 3 business days. We ask that you follow-up with your pharmacy.

## 2018-04-03 NOTE — Telephone Encounter (Signed)
Patient notified

## 2018-04-06 ENCOUNTER — Telehealth: Payer: Self-pay | Admitting: *Deleted

## 2018-04-06 DIAGNOSIS — R918 Other nonspecific abnormal finding of lung field: Secondary | ICD-10-CM

## 2018-04-06 NOTE — Telephone Encounter (Signed)
-----   Message from Tanda Rockers, MD sent at 07/28/2017  7:30 PM EDT ----- Ct f/u nodules no contrast

## 2018-04-07 DIAGNOSIS — H8110 Benign paroxysmal vertigo, unspecified ear: Secondary | ICD-10-CM | POA: Diagnosis not present

## 2018-04-07 DIAGNOSIS — G629 Polyneuropathy, unspecified: Secondary | ICD-10-CM | POA: Diagnosis not present

## 2018-04-07 DIAGNOSIS — H832X2 Labyrinthine dysfunction, left ear: Secondary | ICD-10-CM | POA: Diagnosis not present

## 2018-04-07 MED FILL — POTASSIUM CL ER 20 MEQ TABL: 20 | 45 days supply | Qty: 90 | Fill #1

## 2018-04-13 ENCOUNTER — Other Ambulatory Visit: Payer: Self-pay | Admitting: Family Medicine

## 2018-04-13 DIAGNOSIS — R413 Other amnesia: Secondary | ICD-10-CM

## 2018-04-14 MED FILL — MEMANTINE HCL 10 MG TABS: 10 | 90 days supply | Qty: 180 | Fill #0

## 2018-04-27 ENCOUNTER — Other Ambulatory Visit: Payer: Self-pay | Admitting: Family Medicine

## 2018-04-27 DIAGNOSIS — I10 Essential (primary) hypertension: Secondary | ICD-10-CM

## 2018-04-27 MED FILL — PANTOPRAZOLE SOD DR 40 MG T: 40 | 90 days supply | Qty: 180 | Fill #3

## 2018-04-28 ENCOUNTER — Encounter: Payer: Self-pay | Admitting: Neurology

## 2018-04-28 ENCOUNTER — Other Ambulatory Visit: Payer: Self-pay

## 2018-04-28 ENCOUNTER — Other Ambulatory Visit: Payer: PPO

## 2018-04-28 ENCOUNTER — Ambulatory Visit (INDEPENDENT_AMBULATORY_CARE_PROVIDER_SITE_OTHER): Payer: PPO | Admitting: Neurology

## 2018-04-28 VITALS — BP 122/68 | HR 79 | Ht 69.0 in | Wt 236.0 lb

## 2018-04-28 DIAGNOSIS — R2689 Other abnormalities of gait and mobility: Secondary | ICD-10-CM | POA: Diagnosis not present

## 2018-04-28 DIAGNOSIS — G43109 Migraine with aura, not intractable, without status migrainosus: Secondary | ICD-10-CM

## 2018-04-28 DIAGNOSIS — M5441 Lumbago with sciatica, right side: Secondary | ICD-10-CM

## 2018-04-28 DIAGNOSIS — M5442 Lumbago with sciatica, left side: Secondary | ICD-10-CM

## 2018-04-28 DIAGNOSIS — G629 Polyneuropathy, unspecified: Secondary | ICD-10-CM | POA: Diagnosis not present

## 2018-04-28 MED ORDER — TOPIRAMATE 50 MG PO TABS: 50.0000 mg | ORAL_TABLET | Freq: Two times a day (BID) | ORAL | 3 refills | Status: DC

## 2018-04-28 MED ORDER — SUMATRIPTAN SUCCINATE 50 MG PO TABS: ORAL_TABLET | ORAL | 6 refills | Status: DC

## 2018-04-28 MED FILL — TOPIRAMATE 50 MG TABLET: 50 | 90 days supply | Qty: 180 | Fill #0

## 2018-04-28 MED FILL — SYMBICORT 80-4.5 MCG INH: 80-4.5 | 30 days supply | Qty: 10 | Fill #1

## 2018-04-28 MED FILL — SUMATRIPTAN SUCC 50 MG TABL: 50 | 23 days supply | Qty: 10 | Fill #0
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
## 2018-04-28 NOTE — Patient Instructions (Addendum)
1. Increase Topamax to '50mg'$  twice a day. With your current prescription of Topamax '25mg'$ , take 2 tablets twice a day. Once done, your new bottle will be for Topamax '50mg'$ , take 1 tablet twice a day  2. May take Imitrex as needed for migraines/vertigo. You can also try over the counter migraine medication (Excedrin migraine, Tylenol, Aleve) and see if this causes less drowsiness but also helps with symptoms. Do not take more than 3 a week  3. Bloodwork for TSH, B12, ESR, CRP, SPEP/IFE, BUN, creatinine  Your provider requests that you have LABS drawn today.  We share a lab with [facility] - they are located in [address] (second floor) of this building.  Once you get there, please have a seat and the phlebotomist will call your name.  If you have waited more than 15 minutes, please advise the front desk   4. Schedule MRI brain with and without contrast with cuts through the IACs  We have sent a referral to [facility] for your MRI and they will call you directly to schedule your appt. They are located at [place]. If you need to contact them directly please call [phone].    5. Schedule EMG/NCV of the right UE and LE with Dr. Posey Pronto  6. Refer to [facility] for Balance therapy and physical therapy  7. Follow-up in 5-6 months, call for any changes

## 2018-04-29 MED FILL — VERAPAMIL ER 240 MG TABLET: 240 | 90 days supply | Qty: 90 | Fill #0

## 2018-04-30 LAB — BUN/CREATININE RATIO
BUN: 16 mg/dL (ref 7–25)
Creat: 1.1 mg/dL (ref 0.70–1.25)
GFR, Est African American: 80 mL/min/{1.73_m2} (ref >=60)
GFR, Est Non African American: 69 mL/min/{1.73_m2} (ref >=60)

## 2018-04-30 LAB — PROTEIN ELECTROPHORESIS, SERUM
ALPHA 1: 0.3 g/dL (ref 0.2–0.3)
ALPHA 2: 0.8 g/dL (ref 0.5–0.9)
Albumin ELP: 3.7 g/dL — ABNORMAL LOW (ref 3.8–4.8)
BETA 2: 0.3 g/dL (ref 0.2–0.5)
Beta Globulin: 0.4 g/dL (ref 0.4–0.6)
GAMMA GLOBULIN: 0.8 g/dL (ref 0.8–1.7)
Total Protein: 6.4 g/dL (ref 6.1–8.1)

## 2018-04-30 LAB — IMMUNOFIXATION ELECTROPHORESIS
IGG (IMMUNOGLOBIN G), SERUM: 905 mg/dL (ref 694–1618)
IMMUNOFIX ELECTR INT: NOT DETECTED
IMMUNOGLOBULIN A: 100 mg/dL (ref 81–463)
IgM, Serum: 180 mg/dL (ref 48–271)

## 2018-04-30 LAB — VITAMIN B12: Vitamin B-12: 780 pg/mL (ref 200–1100)

## 2018-04-30 LAB — C-REACTIVE PROTEIN: CRP: 2.9 mg/L (ref <8.0)

## 2018-04-30 LAB — SEDIMENTATION RATE: Sed Rate: 17 mm/h (ref 0–20)

## 2018-04-30 LAB — TSH: TSH: 1.3 m[IU]/L (ref 0.40–4.50)

## 2018-04-30 MED FILL — LEVOCETIRIZINE 5 MG TABLET: 5 | 30 days supply | Qty: 30 | Fill #2

## 2018-05-01 ENCOUNTER — Telehealth: Payer: Self-pay

## 2018-05-01 NOTE — Telephone Encounter (Signed)
-----   Message from Cameron Sprang, MD sent at 05/01/2018 11:34 AM EDT ----- Pls let him know blood work is normal.Thanks

## 2018-05-01 NOTE — Telephone Encounter (Signed)
Spoke with pt relaying message below.   

## 2018-05-04 ENCOUNTER — Other Ambulatory Visit: Payer: Self-pay | Admitting: *Deleted

## 2018-05-04 MED ORDER — POTASSIUM CHLORIDE CRYS ER 20 MEQ PO TBCR: 40.0000 meq | EXTENDED_RELEASE_TABLET | Freq: Every day | ORAL | 1 refills | Status: DC

## 2018-05-05 ENCOUNTER — Ambulatory Visit (INDEPENDENT_AMBULATORY_CARE_PROVIDER_SITE_OTHER): Payer: PPO | Admitting: Neurology

## 2018-05-05 DIAGNOSIS — G629 Polyneuropathy, unspecified: Secondary | ICD-10-CM | POA: Diagnosis not present

## 2018-05-05 DIAGNOSIS — M5412 Radiculopathy, cervical region: Secondary | ICD-10-CM

## 2018-05-05 DIAGNOSIS — M5441 Lumbago with sciatica, right side: Secondary | ICD-10-CM

## 2018-05-05 DIAGNOSIS — G43109 Migraine with aura, not intractable, without status migrainosus: Secondary | ICD-10-CM

## 2018-05-05 DIAGNOSIS — M5442 Lumbago with sciatica, left side: Secondary | ICD-10-CM

## 2018-05-05 MED FILL — XARELTO 10 MG TABLET: 10 | 30 days supply | Qty: 30 | Fill #5

## 2018-05-05 NOTE — Procedures (Signed)
[facility]  [address]  [place], [place] [address] Tel: [phone] Fax:  [phone] Test Date:  05/05/2018  Patient: Garrett [name] DOB: July 01, 1951 Physician: Narda Amber, DO  Sex: Male Height: 5\' 9"  Ref Phys: Ellouise Newer, MD  ID#: [id number] Temp: 32.6C Technician:    Patient Complaints: This is a 67 year old man with neuropathy referred for evaluation and of worsening paresthesia and gait imbalance.  NCV & EMG Findings: Extensive electrodiagnostic testing of the right upper and lower extremity shows:  1. Right radial, sural, and superficial peroneal sensory responses are absent. Right median sensory and ulnar sensory nerves shows prolonged distal peak latency (R4.1, R3.3 ms) and reduced amplitude (R4.1, R4.2 V).   2. Right median motor nerve shows prolonged distal onset latency (4.1 ms) and decreased conduction velocity (Elbow-Wrist, 49 m/s).  Right ulnar motor responses within normal limits.  3. Right peroneal motor response at the extensor digitorum brevis is absent, and reduced (2.7 mV) at the tibialis anterior.  Right tibial motor response is absent. 4. Right tibial H reflex study shows prolonged latency (43.40 ms).   5. Chronic motor axon loss changes are seen affecting all the tested muscles involving the C5-T1 and L3-S1 myotomes on the right, with active denervation isolated to muscles below the knee.  Proximal and deep muscles were not tested as the patient is on anticoagulation therapy.  Impression: 1. The electrophysiologic findings are most consistent with an active on chronic sensorimotor polyneuropathy, predominantly axon loss in type, affecting the right upper and lower extremities. Overall, these findings are severe in degree electrically. 2. Chronic C5-6 radiculopathy affecting the right upper extremity; moderate in degree electrically. 3. A superimposed lumbosacral radiculopathy affecting L3-S1 myotomes cannot be excluded, correlate  clinically.   ___________________________ Narda Amber, DO    Nerve Conduction Studies Anti Sensory Summary Table   Site NR Peak (ms) Norm Peak (ms) P-T Amp (V) Norm P-T Amp  Right Median Anti Sensory (2nd Digit)  Wrist    4.1 <3.8 4.1 >10  Right Radial Anti Sensory (Base 1st Digit)  Wrist NR  <2.8  >10  Right Sup Peroneal Anti Sensory (Ant Lat Mall)  12 cm NR  <4.6  >3  Right Sural Anti Sensory (Lat Mall)  Calf NR  <4.6  >3  Right Ulnar Anti Sensory (5th Digit)  Wrist    3.3 <3.2 4.2 >5   Motor Summary Table   Site NR Onset (ms) Norm Onset (ms) O-P Amp (mV) Norm O-P Amp Site1 Site2 Delta-0 (ms) Dist (cm) Vel (m/s) Norm Vel (m/s)  Right Median Motor (Abd Poll Brev)  Wrist    4.1 <4.0 7.1 >5 Elbow Wrist 6.1 30.0 49 >50  Elbow    10.2  6.3         Right Peroneal Motor (Ext Dig Brev)  Ankle NR  <6.0  >2.5 B Fib Ankle  0.0  >40  B Fib NR     Poplt B Fib  0.0  >40  Poplt NR            Right Peroneal TA Motor (Tib Ant)  Fib Head    3.5 <4.5 2.7 >3 Poplit Fib Head 1.3 8.0 62 >40  Poplit    4.8  2.5         Right Tibial Motor (Abd Hall Brev)  Ankle NR  <6.0  >4 Knee Ankle  0.0  >40  Knee NR  Right Ulnar Motor (Abd Dig Minimi)  Wrist    2.6 <3.1 8.8 >7 B Elbow Wrist 4.5 24.0 53 >50  B Elbow    7.1  8.5  A Elbow B Elbow 2.0 10.0 50 >50  A Elbow    9.1  8.1          H Reflex Studies   NR H-Lat (ms) Lat Norm (ms) L-R H-Lat (ms)  Right Tibial (Gastroc)     43.40 <35    EMG   Side Muscle Ins Act Fibs Psw Fasc Number Recrt Dur Dur. Amp Amp. Poly Poly. Comment  Right 1stDorInt Nml Nml Nml Nml 1- Rapid Some 1+ Some 1+ Some 1+ N/A  Right Abd Poll Brev Nml Nml Nml Nml 1- Rapid Some 1+ Some 1+ Nml Nml N/A  Right Ext Indicis Nml Nml Nml Nml 1- Rapid Some 1+ Some 1+ Some 1+ N/A  Right PronatorTeres Nml Nml Nml Nml 2- Rapid Many 1+ Some 1+ Nml Nml N/A  Right Biceps Nml Nml Nml Nml 2- Rapid Many 1+ Some 1+ Nml Nml N/A  Right Triceps Nml Nml Nml Nml 1- Rapid Some 1+ Some  1+ Nml Nml N/A  Right Deltoid Nml Nml Nml Nml 2- Rapid Many 1+ Some 1+ Nml Nml N/A  Right AntTibialis Nml 1+ Nml Nml 2- Rapid Most 1+ Many 1+ Many 1+ N/A  Right Gastroc Nml 1+ Nml Nml 2- Rapid Most 1+ Many 1+ Many 1+ N/A  Right RectFemoris Nml Nml Nml Nml 1- Rapid Some 1+ Some 1+ Nml Nml N/A  Right BicepsFemS Nml Nml Nml Nml 1- Rapid Some 1+ Some 1+ Nml Nml N/A      Waveforms:

## 2018-05-08 ENCOUNTER — Encounter: Payer: Self-pay | Admitting: Family

## 2018-05-08 ENCOUNTER — Emergency Department (HOSPITAL_COMMUNITY): Payer: PPO

## 2018-05-08 ENCOUNTER — Other Ambulatory Visit: Payer: Self-pay

## 2018-05-08 ENCOUNTER — Inpatient Hospital Stay (HOSPITAL_COMMUNITY)
Admission: EM | Admit: 2018-05-08 | Discharge: 2018-05-10 | DRG: 871 | Disposition: A | Discharge status: Home / Self Care | Payer: PPO | Attending: Internal Medicine | Admitting: Internal Medicine

## 2018-05-08 ENCOUNTER — Ambulatory Visit (INDEPENDENT_AMBULATORY_CARE_PROVIDER_SITE_OTHER)
Admission: RE | Admit: 2018-05-08 | Discharge: 2018-05-08 | Disposition: A | Discharge status: Home / Self Care | Payer: PPO | Source: Ambulatory Visit | Attending: Family | Admitting: Family

## 2018-05-08 ENCOUNTER — Encounter (HOSPITAL_COMMUNITY): Payer: Self-pay

## 2018-05-08 ENCOUNTER — Ambulatory Visit (INDEPENDENT_AMBULATORY_CARE_PROVIDER_SITE_OTHER): Payer: PPO | Admitting: Family

## 2018-05-08 ENCOUNTER — Other Ambulatory Visit (INDEPENDENT_AMBULATORY_CARE_PROVIDER_SITE_OTHER): Payer: PPO

## 2018-05-08 VITALS — BP 130/70 | HR 106 | Temp 98.2°F | Ht 69.0 in | Wt 238.0 lb

## 2018-05-08 DIAGNOSIS — F0390 Unspecified dementia without behavioral disturbance: Secondary | ICD-10-CM | POA: Diagnosis present

## 2018-05-08 DIAGNOSIS — E876 Hypokalemia: Secondary | ICD-10-CM | POA: Diagnosis not present

## 2018-05-08 DIAGNOSIS — E1165 Type 2 diabetes mellitus with hyperglycemia: Secondary | ICD-10-CM | POA: Diagnosis present

## 2018-05-08 DIAGNOSIS — E785 Hyperlipidemia, unspecified: Secondary | ICD-10-CM | POA: Diagnosis not present

## 2018-05-08 DIAGNOSIS — K219 Gastro-esophageal reflux disease without esophagitis: Secondary | ICD-10-CM | POA: Diagnosis not present

## 2018-05-08 DIAGNOSIS — G4733 Obstructive sleep apnea (adult) (pediatric): Secondary | ICD-10-CM | POA: Diagnosis not present

## 2018-05-08 DIAGNOSIS — Z7901 Long term (current) use of anticoagulants: Secondary | ICD-10-CM | POA: Diagnosis not present

## 2018-05-08 DIAGNOSIS — M109 Gout, unspecified: Secondary | ICD-10-CM | POA: Diagnosis present

## 2018-05-08 DIAGNOSIS — K529 Noninfective gastroenteritis and colitis, unspecified: Secondary | ICD-10-CM | POA: Diagnosis present

## 2018-05-08 DIAGNOSIS — R739 Hyperglycemia, unspecified: Secondary | ICD-10-CM

## 2018-05-08 DIAGNOSIS — R918 Other nonspecific abnormal finding of lung field: Secondary | ICD-10-CM | POA: Diagnosis present

## 2018-05-08 DIAGNOSIS — J45991 Cough variant asthma: Secondary | ICD-10-CM | POA: Diagnosis present

## 2018-05-08 DIAGNOSIS — R0602 Shortness of breath: Secondary | ICD-10-CM

## 2018-05-08 DIAGNOSIS — I2699 Other pulmonary embolism without acute cor pulmonale: Secondary | ICD-10-CM | POA: Diagnosis not present

## 2018-05-08 DIAGNOSIS — I110 Hypertensive heart disease with heart failure: Secondary | ICD-10-CM | POA: Diagnosis not present

## 2018-05-08 DIAGNOSIS — E1121 Type 2 diabetes mellitus with diabetic nephropathy: Secondary | ICD-10-CM | POA: Diagnosis present

## 2018-05-08 DIAGNOSIS — R079 Chest pain, unspecified: Secondary | ICD-10-CM

## 2018-05-08 DIAGNOSIS — K449 Diaphragmatic hernia without obstruction or gangrene: Secondary | ICD-10-CM | POA: Diagnosis not present

## 2018-05-08 DIAGNOSIS — Z86711 Personal history of pulmonary embolism: Secondary | ICD-10-CM | POA: Diagnosis not present

## 2018-05-08 DIAGNOSIS — A419 Sepsis, unspecified organism: Principal | ICD-10-CM | POA: Diagnosis present

## 2018-05-08 DIAGNOSIS — Z87891 Personal history of nicotine dependence: Secondary | ICD-10-CM | POA: Diagnosis not present

## 2018-05-08 DIAGNOSIS — Z7951 Long term (current) use of inhaled steroids: Secondary | ICD-10-CM

## 2018-05-08 DIAGNOSIS — F03A0 Unspecified dementia, mild, without behavioral disturbance, psychotic disturbance, mood disturbance, and anxiety: Secondary | ICD-10-CM | POA: Diagnosis present

## 2018-05-08 DIAGNOSIS — E1169 Type 2 diabetes mellitus with other specified complication: Secondary | ICD-10-CM | POA: Diagnosis present

## 2018-05-08 DIAGNOSIS — Z86010 Personal history of colonic polyps: Secondary | ICD-10-CM | POA: Diagnosis not present

## 2018-05-08 DIAGNOSIS — Z8249 Family history of ischemic heart disease and other diseases of the circulatory system: Secondary | ICD-10-CM | POA: Diagnosis not present

## 2018-05-08 DIAGNOSIS — Z91030 Bee allergy status: Secondary | ICD-10-CM

## 2018-05-08 DIAGNOSIS — G43909 Migraine, unspecified, not intractable, without status migrainosus: Secondary | ICD-10-CM | POA: Diagnosis present

## 2018-05-08 DIAGNOSIS — Z79899 Other long term (current) drug therapy: Secondary | ICD-10-CM | POA: Diagnosis not present

## 2018-05-08 DIAGNOSIS — J189 Pneumonia, unspecified organism: Secondary | ICD-10-CM | POA: Diagnosis present

## 2018-05-08 DIAGNOSIS — I10 Essential (primary) hypertension: Secondary | ICD-10-CM | POA: Diagnosis present

## 2018-05-08 DIAGNOSIS — J181 Lobar pneumonia, unspecified organism: Secondary | ICD-10-CM | POA: Diagnosis not present

## 2018-05-08 DIAGNOSIS — Z85828 Personal history of other malignant neoplasm of skin: Secondary | ICD-10-CM

## 2018-05-08 DIAGNOSIS — M1990 Unspecified osteoarthritis, unspecified site: Secondary | ICD-10-CM | POA: Diagnosis not present

## 2018-05-08 DIAGNOSIS — G43009 Migraine without aura, not intractable, without status migrainosus: Secondary | ICD-10-CM | POA: Diagnosis present

## 2018-05-08 LAB — CBC
HCT: 36.6 % — ABNORMAL LOW (ref 39.0–52.0)
Hemoglobin: 11.9 g/dL — ABNORMAL LOW (ref 13.0–17.0)
MCH: 27.6 pg (ref 26.0–34.0)
MCHC: 32.5 g/dL (ref 30.0–36.0)
MCV: 84.9 fL (ref 78.0–100.0)
PLATELETS: 264 10*3/uL (ref 150–400)
RBC: 4.31 MIL/uL (ref 4.22–5.81)
RDW: 16.4 % — ABNORMAL HIGH (ref 11.5–15.5)
WBC: 17.4 10*3/uL — AB (ref 4.0–10.5)

## 2018-05-08 LAB — BASIC METABOLIC PANEL
ANION GAP: 14 (ref 5–15)
BUN: 15 mg/dL (ref 6–20)
CHLORIDE: 110 mmol/L (ref 101–111)
CO2: 18 mmol/L — ABNORMAL LOW (ref 22–32)
CREATININE: 1.13 mg/dL (ref 0.61–1.24)
Calcium: 9.2 mg/dL (ref 8.9–10.3)
GFR calc non Af Amer: >60 mL/min (ref >60)
Glucose, Bld: 205 mg/dL — ABNORMAL HIGH (ref 65–99)
Potassium: 3.3 mmol/L — ABNORMAL LOW (ref 3.5–5.1)
Sodium: 142 mmol/L (ref 135–145)

## 2018-05-08 LAB — I-STAT TROPONIN, ED: TROPONIN I, POC: 0 ng/mL (ref 0.00–0.08)

## 2018-05-08 LAB — GLUCOSE, CAPILLARY
Glucose-Capillary: 96 mg/dL (ref 65–99)
Glucose-Capillary: 134 mg/dL — ABNORMAL HIGH (ref 65–99)

## 2018-05-08 LAB — TROPONIN I: TNIDX: 0 ug/l (ref 0.00–0.06)

## 2018-05-08 MED ORDER — MECLIZINE HCL 25 MG PO TABS: 25.0000 mg | ORAL_TABLET | Freq: Three times a day (TID) | ORAL | Status: DC | PRN

## 2018-05-08 MED ORDER — ASPIRIN EC 81 MG PO TBEC
81.0000 mg | DELAYED_RELEASE_TABLET | Freq: Every day | ORAL | Status: DC
  Administered 2018-05-09 – 2018-05-10 (×2): 81 mg via ORAL
  Filled 2018-05-08 (×2): qty 1

## 2018-05-08 MED ORDER — ONDANSETRON HCL 4 MG/2ML IJ SOLN: 4.0000 mg | Freq: Four times a day (QID) | INTRAMUSCULAR | Status: DC | PRN

## 2018-05-08 MED ORDER — TRAMADOL HCL 50 MG PO TABS: 50.0000 mg | ORAL_TABLET | Freq: Four times a day (QID) | ORAL | Status: DC | PRN

## 2018-05-08 MED ORDER — SODIUM CHLORIDE 0.9 % IV SOLN
2.0000 g | INTRAVENOUS | Status: DC
  Administered 2018-05-09 – 2018-05-10 (×2): 2 g via INTRAVENOUS
  Filled 2018-05-08: qty 20
  Filled 2018-05-08 (×2): qty 2

## 2018-05-08 MED ORDER — ALBUTEROL SULFATE (2.5 MG/3ML) 0.083% IN NEBU
2.5000 mg | INHALATION_SOLUTION | Freq: Once | RESPIRATORY_TRACT | Status: AC
  Administered 2018-05-08: 2.5 mg via RESPIRATORY_TRACT

## 2018-05-08 MED ORDER — ONDANSETRON HCL 4 MG PO TABS: 4.0000 mg | ORAL_TABLET | Freq: Four times a day (QID) | ORAL | Status: DC | PRN

## 2018-05-08 MED ORDER — SODIUM CHLORIDE 0.9 % IV SOLN: 250.0000 mL | INTRAVENOUS | Status: DC | PRN

## 2018-05-08 MED ORDER — ALBUTEROL SULFATE (2.5 MG/3ML) 0.083% IN NEBU
2.5000 mg | INHALATION_SOLUTION | Freq: Four times a day (QID) | RESPIRATORY_TRACT | Status: DC | PRN
Start: 2018-05-08 — End: 2018-05-10
  Administered 2018-05-09: 2.5 mg via RESPIRATORY_TRACT
  Filled 2018-05-08: qty 3

## 2018-05-08 MED ORDER — SODIUM CHLORIDE 0.9 % IV SOLN
1.0000 g | Freq: Once | INTRAVENOUS | Status: AC
  Administered 2018-05-08: 1 g via INTRAVENOUS
  Filled 2018-05-08: qty 10

## 2018-05-08 MED ORDER — AZITHROMYCIN 250 MG PO TABS
500.0000 mg | ORAL_TABLET | Freq: Once | ORAL | Status: AC
Start: 2018-05-08 — End: 2018-05-08
  Administered 2018-05-08: 500 mg via ORAL
  Filled 2018-05-08: qty 2

## 2018-05-08 MED ORDER — POTASSIUM CHLORIDE CRYS ER 10 MEQ PO TBCR
40.0000 meq | EXTENDED_RELEASE_TABLET | Freq: Once | ORAL | Status: AC
  Administered 2018-05-08: 40 meq via ORAL
  Filled 2018-05-08: qty 4

## 2018-05-08 MED ORDER — PRAVASTATIN SODIUM 20 MG PO TABS
10.0000 mg | ORAL_TABLET | Freq: Every day | ORAL | Status: DC
  Administered 2018-05-09 – 2018-05-10 (×2): 10 mg via ORAL
  Filled 2018-05-08 (×2): qty 1

## 2018-05-08 MED ORDER — SENNA 8.6 MG PO TABS
1.0000 | ORAL_TABLET | Freq: Two times a day (BID) | ORAL | Status: DC
  Filled 2018-05-08 (×2): qty 1

## 2018-05-08 MED ORDER — MOMETASONE FURO-FORMOTEROL FUM 100-5 MCG/ACT IN AERO
2.0000 | INHALATION_SPRAY | Freq: Two times a day (BID) | RESPIRATORY_TRACT | Status: DC
  Administered 2018-05-08 – 2018-05-10 (×4): 2 via RESPIRATORY_TRACT
  Filled 2018-05-08: qty 8.8

## 2018-05-08 MED ORDER — POLYETHYLENE GLYCOL 3350 17 G PO PACK: 17.0000 g | PACK | Freq: Every day | ORAL | Status: DC | PRN

## 2018-05-08 MED ORDER — RIVAROXABAN 10 MG PO TABS
10.0000 mg | ORAL_TABLET | Freq: Every day | ORAL | Status: DC
  Administered 2018-05-09 – 2018-05-10 (×2): 10 mg via ORAL
  Filled 2018-05-08 (×2): qty 1

## 2018-05-08 MED ORDER — TOPIRAMATE 25 MG PO TABS
50.0000 mg | ORAL_TABLET | Freq: Two times a day (BID) | ORAL | Status: DC
  Administered 2018-05-08 – 2018-05-10 (×4): 50 mg via ORAL
  Filled 2018-05-08 (×4): qty 2

## 2018-05-08 MED ORDER — INSULIN ASPART 100 UNIT/ML ~~LOC~~ SOLN
0.0000 [IU] | Freq: Three times a day (TID) | SUBCUTANEOUS | Status: DC
  Administered 2018-05-09: 1 [IU] via SUBCUTANEOUS

## 2018-05-08 MED ORDER — ALLOPURINOL 100 MG PO TABS
100.0000 mg | ORAL_TABLET | Freq: Every day | ORAL | Status: DC
  Administered 2018-05-09 – 2018-05-10 (×2): 100 mg via ORAL
  Filled 2018-05-08 (×2): qty 1

## 2018-05-08 MED ORDER — LORATADINE 10 MG PO TABS
10.0000 mg | ORAL_TABLET | Freq: Every evening | ORAL | Status: DC
Start: 2018-05-08 — End: 2018-05-10
  Administered 2018-05-08 – 2018-05-09 (×2): 10 mg via ORAL
  Filled 2018-05-08 (×2): qty 1

## 2018-05-08 MED ORDER — SUMATRIPTAN SUCCINATE 50 MG PO TABS
50.0000 mg | ORAL_TABLET | ORAL | Status: DC | PRN
  Filled 2018-05-08: qty 1

## 2018-05-08 MED ORDER — SODIUM CHLORIDE 0.9% FLUSH: 3.0000 mL | INTRAVENOUS | Status: DC | PRN

## 2018-05-08 MED ORDER — GABAPENTIN 300 MG PO CAPS
300.0000 mg | ORAL_CAPSULE | Freq: Every day | ORAL | Status: DC
  Administered 2018-05-08 – 2018-05-09 (×2): 300 mg via ORAL
  Filled 2018-05-08 (×2): qty 1

## 2018-05-08 MED ORDER — RISAQUAD PO CAPS
1.0000 | ORAL_CAPSULE | Freq: Every day | ORAL | Status: DC
  Administered 2018-05-09 – 2018-05-10 (×2): 1 via ORAL
  Filled 2018-05-08 (×2): qty 1

## 2018-05-08 MED ORDER — CELECOXIB 200 MG PO CAPS
400.0000 mg | ORAL_CAPSULE | Freq: Every day | ORAL | Status: DC
  Administered 2018-05-09 – 2018-05-10 (×2): 400 mg via ORAL
  Filled 2018-05-08 (×3): qty 2

## 2018-05-08 MED ORDER — IOPAMIDOL (ISOVUE-370) INJECTION 76%
INTRAVENOUS | Status: AC
  Administered 2018-05-08: 100 mL
  Filled 2018-05-08: qty 100

## 2018-05-08 MED ORDER — DOCUSATE SODIUM 100 MG PO CAPS
100.0000 mg | ORAL_CAPSULE | Freq: Two times a day (BID) | ORAL | Status: DC
  Administered 2018-05-09: 100 mg via ORAL
  Filled 2018-05-08 (×2): qty 1

## 2018-05-08 MED ORDER — AZITHROMYCIN 250 MG PO TABS
250.0000 mg | ORAL_TABLET | Freq: Every day | ORAL | Status: DC
  Administered 2018-05-09 – 2018-05-10 (×2): 250 mg via ORAL
  Filled 2018-05-08 (×2): qty 1

## 2018-05-08 MED ORDER — ACETAMINOPHEN 325 MG PO TABS
650.0000 mg | ORAL_TABLET | Freq: Four times a day (QID) | ORAL | Status: DC | PRN
  Administered 2018-05-09: 650 mg via ORAL
  Filled 2018-05-08: qty 2

## 2018-05-08 MED ORDER — PANTOPRAZOLE SODIUM 40 MG PO TBEC
40.0000 mg | DELAYED_RELEASE_TABLET | Freq: Two times a day (BID) | ORAL | Status: DC
  Administered 2018-05-08 – 2018-05-10 (×4): 40 mg via ORAL
  Filled 2018-05-08 (×5): qty 1

## 2018-05-08 MED ORDER — MAGNESIUM OXIDE 400 (241.3 MG) MG PO TABS
400.0000 mg | ORAL_TABLET | Freq: Every day | ORAL | Status: DC
Start: 2018-05-09 — End: 2018-05-10
  Administered 2018-05-09 – 2018-05-10 (×2): 400 mg via ORAL
  Filled 2018-05-08 (×2): qty 1

## 2018-05-08 MED ORDER — CYCLOBENZAPRINE HCL 5 MG PO TABS: 5.0000 mg | ORAL_TABLET | Freq: Every day | ORAL | Status: DC | PRN

## 2018-05-08 MED ORDER — VITAMIN B-12 1000 MCG PO TABS
500.0000 ug | ORAL_TABLET | Freq: Every day | ORAL | Status: DC
  Administered 2018-05-09 – 2018-05-10 (×2): 500 ug via ORAL
  Filled 2018-05-08 (×2): qty 1

## 2018-05-08 MED ORDER — METOPROLOL TARTRATE 25 MG PO TABS
25.0000 mg | ORAL_TABLET | Freq: Every day | ORAL | Status: DC
  Administered 2018-05-09 – 2018-05-10 (×2): 25 mg via ORAL
  Filled 2018-05-08 (×2): qty 1

## 2018-05-08 MED ORDER — ACETAMINOPHEN 650 MG RE SUPP: 650.0000 mg | Freq: Four times a day (QID) | RECTAL | Status: DC | PRN

## 2018-05-08 MED ORDER — SODIUM CHLORIDE 0.9% FLUSH
3.0000 mL | Freq: Two times a day (BID) | INTRAVENOUS | Status: DC
  Administered 2018-05-08 – 2018-05-09 (×3): 3 mL via INTRAVENOUS

## 2018-05-08 MED ORDER — ADULT MULTIVITAMIN W/MINERALS CH
ORAL_TABLET | Freq: Every day | ORAL | Status: DC
  Administered 2018-05-09 – 2018-05-10 (×2): 1 via ORAL
  Filled 2018-05-08 (×2): qty 1

## 2018-05-08 MED ORDER — FLUTICASONE PROPIONATE 50 MCG/ACT NA SUSP
1.0000 | Freq: Every day | NASAL | Status: DC
  Administered 2018-05-09 – 2018-05-10 (×3): 1 via NASAL
  Filled 2018-05-08: qty 16

## 2018-05-08 MED ORDER — MEMANTINE HCL 10 MG PO TABS
20.0000 mg | ORAL_TABLET | Freq: Every day | ORAL | Status: DC
  Administered 2018-05-09 – 2018-05-10 (×2): 20 mg via ORAL
  Filled 2018-05-08 (×2): qty 2

## 2018-05-08 MED ORDER — VERAPAMIL HCL ER 240 MG PO TBCR
240.0000 mg | EXTENDED_RELEASE_TABLET | Freq: Every day | ORAL | Status: DC
  Administered 2018-05-08 – 2018-05-09 (×2): 240 mg via ORAL
  Filled 2018-05-08 (×2): qty 1

## 2018-05-08 NOTE — Progress Notes (Signed)
Discussed with patient; concern for PE or MI; he agrees to go to ER for further evaluation.
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
## 2018-05-08 NOTE — ED Provider Notes (Signed)
Medical screening examination/treatment/procedure(s) were conducted as a shared visit with non-physician practitioner(s) and myself.  I personally evaluated the patient during the encounter.  EKG Interpretation  Date/Time:  Friday May 08 2018 12:33:41 EDT Ventricular Rate:  112 PR Interval:    QRS Duration: 79 QT Interval:  318 QTC Calculation: 434 R Axis:   88 Text Interpretation:  Sinus tachycardia Borderline right axis deviation Borderline T abnormalities, anterior leads Baseline wander in lead(s) V1 Confirmed by Lacretia Leigh ([id number]) on 05/08/2018 3:03:26 PM    Patient here with cough and shortness of breath x24 hours.  His CT scan shows pneumonia.  Patient complains of feeling weak.  Will start on IV antibiotics and admit to the hospital   Lacretia Leigh, MD 05/08/18 1503
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
## 2018-05-08 NOTE — ED Triage Notes (Signed)
Shortness of breath, onset last night. Patient also reports non-productive cough x 3 days and mid-sternal c/p. Coming from Dr. Charlton Amor office, sent here for further evaluation, hx of PE.
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
## 2018-05-09 DIAGNOSIS — Z87891 Personal history of nicotine dependence: Secondary | ICD-10-CM | POA: Diagnosis not present

## 2018-05-09 DIAGNOSIS — K219 Gastro-esophageal reflux disease without esophagitis: Secondary | ICD-10-CM

## 2018-05-09 DIAGNOSIS — G43909 Migraine, unspecified, not intractable, without status migrainosus: Secondary | ICD-10-CM | POA: Diagnosis present

## 2018-05-09 DIAGNOSIS — I2699 Other pulmonary embolism without acute cor pulmonale: Secondary | ICD-10-CM | POA: Diagnosis not present

## 2018-05-09 DIAGNOSIS — I10 Essential (primary) hypertension: Secondary | ICD-10-CM | POA: Diagnosis not present

## 2018-05-09 DIAGNOSIS — E785 Hyperlipidemia, unspecified: Secondary | ICD-10-CM | POA: Diagnosis present

## 2018-05-09 DIAGNOSIS — M1990 Unspecified osteoarthritis, unspecified site: Secondary | ICD-10-CM

## 2018-05-09 DIAGNOSIS — J181 Lobar pneumonia, unspecified organism: Secondary | ICD-10-CM

## 2018-05-09 DIAGNOSIS — E1169 Type 2 diabetes mellitus with other specified complication: Secondary | ICD-10-CM | POA: Diagnosis present

## 2018-05-09 DIAGNOSIS — Z79899 Other long term (current) drug therapy: Secondary | ICD-10-CM | POA: Diagnosis not present

## 2018-05-09 DIAGNOSIS — Z85828 Personal history of other malignant neoplasm of skin: Secondary | ICD-10-CM | POA: Diagnosis not present

## 2018-05-09 DIAGNOSIS — Z86711 Personal history of pulmonary embolism: Secondary | ICD-10-CM | POA: Diagnosis not present

## 2018-05-09 DIAGNOSIS — R918 Other nonspecific abnormal finding of lung field: Secondary | ICD-10-CM | POA: Diagnosis not present

## 2018-05-09 DIAGNOSIS — Z7901 Long term (current) use of anticoagulants: Secondary | ICD-10-CM | POA: Diagnosis not present

## 2018-05-09 DIAGNOSIS — M109 Gout, unspecified: Secondary | ICD-10-CM | POA: Diagnosis present

## 2018-05-09 DIAGNOSIS — G4733 Obstructive sleep apnea (adult) (pediatric): Secondary | ICD-10-CM | POA: Diagnosis present

## 2018-05-09 DIAGNOSIS — E1121 Type 2 diabetes mellitus with diabetic nephropathy: Secondary | ICD-10-CM | POA: Diagnosis present

## 2018-05-09 DIAGNOSIS — Z86010 Personal history of colonic polyps: Secondary | ICD-10-CM | POA: Diagnosis not present

## 2018-05-09 DIAGNOSIS — A419 Sepsis, unspecified organism: Secondary | ICD-10-CM | POA: Diagnosis present

## 2018-05-09 DIAGNOSIS — E876 Hypokalemia: Secondary | ICD-10-CM | POA: Diagnosis present

## 2018-05-09 DIAGNOSIS — F0390 Unspecified dementia without behavioral disturbance: Secondary | ICD-10-CM | POA: Diagnosis present

## 2018-05-09 DIAGNOSIS — K449 Diaphragmatic hernia without obstruction or gangrene: Secondary | ICD-10-CM | POA: Diagnosis present

## 2018-05-09 DIAGNOSIS — Z8249 Family history of ischemic heart disease and other diseases of the circulatory system: Secondary | ICD-10-CM | POA: Diagnosis not present

## 2018-05-09 DIAGNOSIS — I110 Hypertensive heart disease with heart failure: Secondary | ICD-10-CM | POA: Diagnosis present

## 2018-05-09 DIAGNOSIS — Z91030 Bee allergy status: Secondary | ICD-10-CM | POA: Diagnosis not present

## 2018-05-09 DIAGNOSIS — J45991 Cough variant asthma: Secondary | ICD-10-CM | POA: Diagnosis present

## 2018-05-09 DIAGNOSIS — J189 Pneumonia, unspecified organism: Secondary | ICD-10-CM | POA: Diagnosis present

## 2018-05-09 DIAGNOSIS — E1165 Type 2 diabetes mellitus with hyperglycemia: Secondary | ICD-10-CM | POA: Diagnosis present

## 2018-05-09 LAB — BASIC METABOLIC PANEL
BUN: 12 mg/dL (ref 6–20)
CO2: 20 mmol/L — ABNORMAL LOW (ref 22–32)
Chloride: 111 mmol/L (ref 101–111)
GFR calc Af Amer: >60 mL/min (ref >60)
Potassium: 3.8 mmol/L (ref 3.5–5.1)
Sodium: 143 mmol/L (ref 135–145)

## 2018-05-09 LAB — BASIC METABOLIC PANEL WITH GFR
Anion gap: 12 (ref 5–15)
Calcium: 9.1 mg/dL (ref 8.9–10.3)
Creatinine, Ser: 1 mg/dL (ref 0.61–1.24)
GFR calc non Af Amer: >60 mL/min (ref >60)
Glucose, Bld: 128 mg/dL — ABNORMAL HIGH (ref 65–99)

## 2018-05-09 LAB — CBC
HCT: 36.7 % — ABNORMAL LOW (ref 39.0–52.0)
Hemoglobin: 11.4 g/dL — ABNORMAL LOW (ref 13.0–17.0)
MCH: 26.7 pg (ref 26.0–34.0)
MCHC: 31.1 g/dL (ref 30.0–36.0)
MCV: 85.9 fL (ref 78.0–100.0)
Platelets: 257 K/uL (ref 150–400)
RBC: 4.27 MIL/uL (ref 4.22–5.81)
RDW: 16.9 % — ABNORMAL HIGH (ref 11.5–15.5)
WBC: 11.7 10*3/uL — ABNORMAL HIGH (ref 4.0–10.5)

## 2018-05-09 LAB — GLUCOSE, CAPILLARY
Glucose-Capillary: 110 mg/dL — ABNORMAL HIGH (ref 65–99)
Glucose-Capillary: 135 mg/dL — ABNORMAL HIGH (ref 65–99)
Glucose-Capillary: 95 mg/dL (ref 65–99)

## 2018-05-09 LAB — STREP PNEUMONIAE URINARY ANTIGEN: Strep Pneumo Urinary Antigen: NEGATIVE

## 2018-05-09 NOTE — Progress Notes (Signed)
PROGRESS NOTE    Garrett [name]  BPZ:[id number] DOB: 1951-03-08 DOA: 05/08/2018 PCP: Ann Held, DO   Brief Narrative:  HPI On 05/08/2018 by Dr. Thomes Dinning Garrett [name] is a 67 y.o. male with medical history significant of type 2 diabetes on oral hypoglycemics, GERD status post 2 hiatal hernia repairs and Nissen fundoplication, obstructive sleep apnea on CPAP, grade 1 diastolic dysfunction by echo on 03/03/2017, migraine headache, history of PE on rivaroxaban, hypertension, hyperlipidemia, mild cognitive deficit, osteoarthritis and multiple back surgeries who comes in with several days of cough, malaise, shortness of breath.  Patient reports he was doing well until several days ago when he began to develop a dry cough.  He apparently carries a diagnosis of upper airway cough syndrome that has since been resolved.  He reports that daily over the last 2 to 3 days he is seen increasing malaise and fatigue.  He is noted that he is "wiped out" and everything hurts.  Last night he was sleeping with his CPAP on and it tears off because he developed acute shortness of breath.  He went to see PCP today because of the symptoms where reportedly a chest x-ray showed no evidence of pneumonia.  Patient was sent to the ED due to concern for possible pulmonary embolism due to his history.  He denies any fevers but does report chills over the last 2 to 3 days.  He denies any nausea, vomiting, abdominal pain, cough.  He has had a proximal he 1 month of diarrhea that is nonbloody.  He reports only chronic mild lower extremity swelling with no orthopnea or paroxysmal nocturnal dyspnea.  Assessment & Plan   Sepsis secondary to multifocal pneumonia -Upon admission, patient was tachycardic, tachypneic, with leukocytosis -CTA chest negative for pulmonary embolism.  Patchy areas of groundglass and more confluent airspace opacity in the left upper and lower lobes consistent with multifocal pneumonia. -Blood  cultures pending -Will order strep pneumonia and Legionella urine antigens (of note, patient states he does not clean his CPAP machine often) -Continue azithromycin, ceftriaxone -Will monitor oxygen saturations with ambulation as patient does appear to be using accessory muscles to breath at rest  Cough variant asthma/seasonal allergies -Continue Flonase, Claritin, Dulera  Hypokalemia -Possibly secondary to chronic diarrhea -Resolved with replacement, will continue to monitor BMP  Essential hypertension -Continue metoprolol, verapamil  Diabetes mellitus, type II -Metformin held -Continue insulin sliding scale CBG monitoring  Hyperlipidemia Continue statin  Gout -Stable, continue allopurinol  GERD, history of hiatal hernia -Continue PPI  Obstructive sleep apnea -Continue CPAP  Chronic diarrhea -Continue acidophilus -Has been ongoing for approximately 2 months -Discussed with patient the need for GI follow-up.  He is also overdue for his colonoscopy.  Pulmonary nodules -Noted on CTA chest, appear to be stable and unchanged from CTA 05/13/2017 and 02/04/2017 -Considered benign  History of pulmonary embolism -CTA as noted above -Continue Xarelto  DVT Prophylaxis Xarelto  Code Status: Full  Family Communication: Wife at bedside  Disposition Plan: Currently in observation.  Feel the patient should be admitted given his sepsis, respiratory status, and need for IV antibiotics.  Suspect home when stable  Consultants None  Procedures  None  Antibiotics   Anti-infectives (From admission, onward)   Start     Dose/Rate Route Frequency Ordered Stop   05/09/18 1500  cefTRIAXone (ROCEPHIN) 2 g in sodium chloride 0.9 % 100 mL IVPB     2 g 200 mL/hr over 30 Minutes Intravenous Every  24 hours 05/08/18 1712     05/09/18 1000  azithromycin (ZITHROMAX) tablet 250 mg     250 mg Oral Daily 05/08/18 1712     05/08/18 1500  cefTRIAXone (ROCEPHIN) 1 g in sodium chloride 0.9 % 100  mL IVPB     1 g 200 mL/hr over 30 Minutes Intravenous  Once 05/08/18 1450 05/08/18 1558   05/08/18 1500  azithromycin (ZITHROMAX) tablet 500 mg     500 mg Oral  Once 05/08/18 1450 05/08/18 1502      Subjective:   Garrett [name] seen and examined today.  Continues to have some shortness of breath and mild cough.  Denies current chest pain, abdominal pain, nausea or vomiting.  States he does have diarrhea which is been ongoing for the last several months.  Denies headache.  Does complain of some dizziness.  Objective:   Vitals:   05/08/18 2206 05/09/18 0446 05/09/18 0857 05/09/18 0900  BP:  130/79    Pulse:  67    Resp: 18 20    Temp:      TempSrc:      SpO2:  96% 95% 95%    Intake/Output Summary (Last 24 hours) at 05/09/2018 1014 Last data filed at 05/08/2018 1558 Gross per 24 hour  Intake 100 ml  Output -  Net 100 ml   There were no vitals filed for this visit.  Exam  General: Well developed, well nourished, NAD, appears stated age  34: NCAT, mucous membranes moist.   Neck: Supple  Cardiovascular: S1 S2 auscultated, no rubs, murmurs or gallops. Regular rate and rhythm.  Respiratory: Diminished breath sounds, accessory muscle use with increased work of breathing  Abdomen: Soft, obese, nontender, nondistended, + bowel sounds  Extremities: warm dry without cyanosis clubbing or edema  Neuro: AAOx3, nonfocal  Skin: Without rashes exudates or nodules  Psych: Normal affect and demeanor with intact judgement and insight, pleasant   Data Reviewed: I have personally reviewed following labs and imaging studies  CBC: Recent Labs  Lab 05/08/18 1250 05/09/18 0525  WBC 17.4* 11.7*  HGB 11.9* 11.4*  HCT 36.6* 36.7*  MCV 84.9 85.9  PLT 264 938   Basic Metabolic Panel: Recent Labs  Lab 05/08/18 1250 05/09/18 0525  NA 142 143  K 3.3* 3.8  CL 110 111  CO2 18* 20*  GLUCOSE 205* 128*  BUN 15 12  CREATININE 1.13 1.00  CALCIUM 9.2 9.1   GFR: Estimated  Creatinine Clearance: 85.6 mL/min (by C-G formula based on SCr of 1 mg/dL). Liver Function Tests: No results for input(s): AST, ALT, ALKPHOS, BILITOT, PROT, ALBUMIN in the last 168 hours. No results for input(s): LIPASE, AMYLASE in the last 168 hours. No results for input(s): AMMONIA in the last 168 hours. Coagulation Profile: No results for input(s): INR, PROTIME in the last 168 hours. Cardiac Enzymes: No results for input(s): CKTOTAL, CKMB, CKMBINDEX, TROPONINI in the last 168 hours. BNP (last 3 results) No results for input(s): PROBNP in the last 8760 hours. HbA1C: No results for input(s): HGBA1C in the last 72 hours. CBG: Recent Labs  Lab 05/08/18 1810 05/08/18 2233 05/09/18 0731  GLUCAP 96 134* 110*   Lipid Profile: No results for input(s): CHOL, HDL, LDLCALC, TRIG, CHOLHDL, LDLDIRECT in the last 72 hours. Thyroid Function Tests: No results for input(s): TSH, T4TOTAL, FREET4, T3FREE, THYROIDAB in the last 72 hours. Anemia Panel: No results for input(s): VITAMINB12, FOLATE, FERRITIN, TIBC, IRON, RETICCTPCT in the last 72 hours. Urine analysis:  Component Value Date/Time   COLORURINE STRAW (A) 02/28/2017 1418   APPEARANCEUR CLEAR 02/28/2017 1418   LABSPEC 1.004 (L) 02/28/2017 1418   PHURINE 5.0 02/28/2017 1418   GLUCOSEU NEGATIVE 02/28/2017 1418   GLUCOSEU NEGATIVE 04/05/2011 0946   HGBUR NEGATIVE 02/28/2017 1418   BILIRUBINUR NEGATIVE 02/28/2017 1418   BILIRUBINUR neg 09/19/2016 1054   [place] 02/28/2017 1418   PROTEINUR NEGATIVE 02/28/2017 1418   UROBILINOGEN negative 09/19/2016 1054   UROBILINOGEN 0.2 04/05/2011 0946   NITRITE NEGATIVE 02/28/2017 1418   LEUKOCYTESUR NEGATIVE 02/28/2017 1418   Sepsis Labs: @LABRCNTIP (procalcitonin:4,lacticidven:4)  )No results found for this or any previous visit (from the past 240 hour(s)).    Radiology Studies: Dg Chest 2 View  Result Date: 05/08/2018 CLINICAL DATA:  Shortness of breath EXAM: CHEST - 2 VIEW  COMPARISON:  Chest radiograph March 17, 2017 and May 13, 2017 chest CT FINDINGS: There is mild elevation of the right hemidiaphragm, stable. There is scarring in the left base region. There is no edema or consolidation. The heart size and pulmonary vascularity are normal. No adenopathy. There is a focal hiatal hernia. There is degenerative change in thoracic spine. There is postoperative change in the lower cervical region. IMPRESSION: Scarring left base. No edema or consolidation. Focal hiatal hernia. No evident adenopathy. Electronically Signed   By: Lowella Grip III M.D.   On: 05/08/2018 11:14   Ct Angio Chest Pe W/cm &/or Wo Cm  Result Date: 05/08/2018 CLINICAL DATA:  Short of breath and chest pain since midnight. EXAM: CT ANGIOGRAPHY CHEST WITH CONTRAST TECHNIQUE: Multidetector CT imaging of the chest was performed using the standard protocol during bolus administration of intravenous contrast. Multiplanar CT image reconstructions and MIPs were obtained to evaluate the vascular anatomy. CONTRAST:  173mL ISOVUE-370 IOPAMIDOL (ISOVUE-370) INJECTION 76% COMPARISON:  Current chest radiograph.  Chest CT, 05/13/2017. FINDINGS: Cardiovascular: There is satisfactory opacification of the pulmonary arteries to the segmental level. There is no evidence of a pulmonary embolus. Great vessels are normal in caliber. No aortic dissection minimal atherosclerotic change along the descending thoracic aorta. Heart is normal in size. Mild left coronary artery calcifications. No pericardial effusion. Mediastinum/Nodes: No neck base, axillary, mediastinal or hilar masses or enlarged lymph nodes. Thyroid is unremarkable. Trachea is widely patent. Moderate hiatal hernia. Esophagus unremarkable. Lungs/Pleura: There are patchy areas of ground-glass and more confluent airspace opacity in a peribronchovascular distribution in the left upper lower lobes, suspicious for infection. Discoid type opacity is noted in the right lower  lobe adjacent to the oblique fissure, most likely atelectasis. There are multiple bilateral lung nodules. There is a 6 mm pleural-based right upper lobe nodule, image 32, series 10. 3 mm pleural based pulmonary nodule in the right upper lobe, image 39. Mm pulmonary nodule right upper lobe, image 52. 5 mm pulmonary nodule right lower lobe, image 61. 6 mm nodule pleural-based in the right lower lobe, image 54. 5 mm pleural-based nodule in the left lower lobe, image 58. Nodules are stable from prior exam. No pleural effusion.  No pneumothorax. Upper Abdomen: No acute findings. Low-density liver lesions consistent with cysts, stable from the prior CT. Musculoskeletal: No fracture or acute finding. No osteoblastic or osteolytic lesions. Review of the MIP images confirms the above findings. IMPRESSION: 1. No evidence of a pulmonary embolism. 2. Patchy areas of ground-glass and more confluent airspace opacity in the left upper and lower lobes consistent multifocal pneumonia in proper clinical setting. 3. Multiple small pulmonary nodules without significant change from  prior CT angiograms from 05/13/2017 and 02/04/2017. Given the stability over more than 1 year, these can be considered benign with no specific follow-up imaging is recommended. Aortic Atherosclerosis (ICD10-I70.0). Electronically Signed   By: Lajean Manes M.D.   On: 05/08/2018 14:43     Scheduled Meds: . acidophilus  1 capsule Oral Daily  . allopurinol  100 mg Oral Daily  . aspirin EC  81 mg Oral Daily  . azithromycin  250 mg Oral Daily  . celecoxib  400 mg Oral Daily  . docusate sodium  100 mg Oral BID  . fluticasone  1 spray Each Nare Daily  . gabapentin  300 mg Oral QHS  . insulin aspart  0-9 Units Subcutaneous TID WC  . loratadine  10 mg Oral QPM  . magnesium oxide  400 mg Oral Daily  . memantine  20 mg Oral Daily  . metoprolol tartrate  25 mg Oral Daily  . mometasone-formoterol  2 puff Inhalation BID  . multivitamin with minerals    Oral Daily  . pantoprazole  40 mg Oral BID  . pravastatin  10 mg Oral Daily  . rivaroxaban  10 mg Oral Q breakfast  . senna  1 tablet Oral BID  . sodium chloride flush  3 mL Intravenous Q12H  . topiramate  50 mg Oral BID  . verapamil  240 mg Oral QHS  . vitamin B-12  500 mcg Oral Daily   Continuous Infusions: . sodium chloride    . cefTRIAXone (ROCEPHIN)  IV       LOS: 0 days   Time Spent in minutes   45 minutes  Hillel Card D.O. on 05/09/2018 at 10:14 AM  Between 7am to 7pm - Pager - [phone]  After 7pm go to [email or web address] - password [email or web address]  And look for the night coverage person covering for me after hours  [facility] Office  [phone]

## 2018-05-10 ENCOUNTER — Other Ambulatory Visit: Payer: Self-pay | Admitting: Family Medicine

## 2018-05-10 DIAGNOSIS — R197 Diarrhea, unspecified: Secondary | ICD-10-CM

## 2018-05-10 LAB — GLUCOSE, CAPILLARY
Glucose-Capillary: 182 mg/dL — ABNORMAL HIGH (ref 65–99)
Glucose-Capillary: 116 mg/dL — ABNORMAL HIGH (ref 65–99)

## 2018-05-10 MED ORDER — GUAIFENESIN-CODEINE 100-10 MG/5ML PO SOLN: 5.0000 mL | Freq: Four times a day (QID) | ORAL | 0 refills | Status: DC | PRN

## 2018-05-10 MED ORDER — CEFUROXIME AXETIL 500 MG PO TABS: 500.0000 mg | ORAL_TABLET | Freq: Two times a day (BID) | ORAL | 0 refills | Status: DC

## 2018-05-10 MED ORDER — AZITHROMYCIN 250 MG PO TABS: 250.0000 mg | ORAL_TABLET | Freq: Every day | ORAL | 0 refills | Status: DC

## 2018-05-10 NOTE — Progress Notes (Signed)
Pt is discharged to home. DC instructions given with wife at bedside. No concerns voiced. Left unit in wheelchair pushed by nurse tech accompanied by wife. Left in stable condition.  Hale Bogus.

## 2018-05-10 NOTE — Discharge Instructions (Signed)
Community-Acquired Pneumonia, Adult Pneumonia is an infection of the lungs. There are different types of pneumonia. One type can develop while a person is in a hospital. A different type, called community-acquired pneumonia, develops in people who are not, or have not recently been, in the hospital or other health care facility. What are the causes? Pneumonia may be caused by bacteria, viruses, or funguses. Community-acquired pneumonia is often caused by Streptococcus pneumonia bacteria. These bacteria are often passed from one person to another by breathing in droplets from the cough or sneeze of an infected person. What increases the risk? The condition is more likely to develop in:  People who havechronic diseases, such as chronic obstructive pulmonary disease (COPD), asthma, congestive heart failure, cystic fibrosis, diabetes, or kidney disease.  People who haveearly-stage or late-stage HIV.  People who havesickle cell disease.  People who havehad their spleen removed (splenectomy).  People who havepoor dental hygiene.  People who havemedical conditions that increase the risk of breathing in (aspirating) secretions their own mouth and nose.  People who havea weakened immune system (immunocompromised).  People who smoke.  People whotravel to areas where pneumonia-causing germs commonly exist.  People whoare around animal habitats or animals that have pneumonia-causing germs, including birds, bats, rabbits, cats, and farm animals.  What are the signs or symptoms? Symptoms of this condition include:  Adry cough.  A wet (productive) cough.  Fever.  Sweating.  Chest pain, especially when breathing deeply or coughing.  Rapid breathing or difficulty breathing.  Shortness of breath.  Shaking chills.  Fatigue.  Muscle aches.  How is this diagnosed? Your health care provider will take a medical history and perform a physical exam. You may also have other tests,  including:  Imaging studies of your chest, including X-rays.  Tests to check your blood oxygen level and other blood gases.  Other tests on blood, mucus (sputum), fluid around your lungs (pleural fluid), and urine.  If your pneumonia is severe, other tests may be done to identify the specific cause of your illness. How is this treated? The type of treatment that you receive depends on many factors, such as the cause of your pneumonia, the medicines you take, and other medical conditions that you have. For most adults, treatment and recovery from pneumonia may occur at home. In some cases, treatment must happen in a hospital. Treatment may include:  Antibiotic medicines, if the pneumonia was caused by bacteria.  Antiviral medicines, if the pneumonia was caused by a virus.  Medicines that are given by mouth or through an IV tube.  Oxygen.  Respiratory therapy.  Although rare, treating severe pneumonia may include:  Mechanical ventilation. This is done if you are not breathing well on your own and you cannot maintain a safe blood oxygen level.  Thoracentesis. This procedureremoves fluid around one lung or both lungs to help you breathe better.  Follow these instructions at home:  Take over-the-counter and prescription medicines only as told by your health care provider. ? Only takecough medicine if you are losing sleep. Understand that cough medicine can prevent your body's natural ability to remove mucus from your lungs. ? If you were prescribed an antibiotic medicine, take it as told by your health care provider. Do not stop taking the antibiotic even if you start to feel better.  Sleep in a semi-upright position at night. Try sleeping in a reclining chair, or place a few pillows under your head.  Do not use tobacco products, including cigarettes, chewing   tobacco, and e-cigarettes. If you need help quitting, ask your health care provider.  Drink enough water to keep your urine  clear or pale yellow. This will help to thin out mucus secretions in your lungs. How is this prevented? There are ways that you can decrease your risk of developing community-acquired pneumonia. Consider getting a pneumococcal vaccine if:  You are older than 68 years of age.  You are older than 68 years of age and are undergoing cancer treatment, have chronic lung disease, or have other medical conditions that affect your immune system. Ask your health care provider if this applies to you.  There are different types and schedules of pneumococcal vaccines. Ask your health care provider which vaccination option is best for you. You may also prevent community-acquired pneumonia if you take these actions:  Get an influenza vaccine every year. Ask your health care provider which type of influenza vaccine is best for you.  Go to the dentist on a regular basis.  Wash your hands often. Use hand sanitizer if soap and water are not available.  Contact a health care provider if:  You have a fever.  You are losing sleep because you cannot control your cough with cough medicine. Get help right away if:  You have worsening shortness of breath.  You have increased chest pain.  Your sickness becomes worse, especially if you are an older adult or have a weakened immune system.  You cough up blood. This information is not intended to replace advice given to you by your health care provider. Make sure you discuss any questions you have with your health care provider. Document Released: 12/09/2005 Document Revised: 04/18/2016 Document Reviewed: 04/05/2015 [employer] Interactive Patient Education  2018 [employer]  

## 2018-05-10 NOTE — Discharge Summary (Signed)
Physician Discharge Summary  Garrett [name] RSW:[id number] DOB: 10-Apr-1951 DOA: 05/08/2018  PCP: Ann Held, DO  Admit date: 05/08/2018 Discharge date: 05/10/2018  Time spent: 45 minutes  Recommendations for Outpatient Follow-up:  Patient will be discharged to home.  Patient will need to follow up with primary care provider within one week of discharge, repeat BMP. Follow up with gastroenterology.  Patient should continue medications as prescribed.  Patient should follow a heart healthy/carb modified diet.   Discharge Diagnoses:  Sepsis secondary to multifocal pneumonia Cough variant asthma/seasonal allergies Hypokalemia Essential hypertension Diabetes mellitus, type II Hyperlipidemia Gout GERD, history of hiatal hernia Obstructive sleep apnea Chronic diarrhea Pulmonary nodules History of pulmonary embolism  Discharge Condition: stable  Diet recommendation: heart healthy/carb modified  There were no vitals filed for this visit.  History of present illness:  On 05/08/2018 by Dr. Fredia Sorrow Rilingis a 67 y.o.malewith medical history significant oftype 2 diabetes on oral hypoglycemics, GERD status post 2 hiatal hernia repairs and Nissen fundoplication, obstructive sleep apnea on CPAP, grade 1 diastolic dysfunction by echo on 03/03/2017, migraine headache, history of PE on rivaroxaban, hypertension, hyperlipidemia, mild cognitive deficit, osteoarthritis and multiple back surgeries who comes in with several days of cough, malaise, shortness of breath. Patient reports he was doing well until several days ago when he began to develop a dry cough. He apparently carries a diagnosis of upper airway cough syndrome that has since been resolved. He reports that daily over the last 2 to 3 days he is seen increasing malaise and fatigue. He is noted that he is "wiped out" and everything hurts. Last night he was sleeping with his CPAP on and it tears off because he  developed acute shortness of breath. He went to see PCP today because of the symptoms where reportedly a chest x-ray showed no evidence of pneumonia. Patient was sent to the ED due to concern for possible pulmonary embolism due to his history. He denies any fevers but does report chills over the last 2 to 3 days. He denies any nausea, vomiting, abdominal pain, cough. He has had a proximal he 1 month of diarrhea that is nonbloody. He reports only chronic mild lower extremity swelling with no orthopnea or paroxysmal nocturnal dyspnea.  Hospital Course:  Sepsis secondary to multifocal pneumonia -Upon admission, patient was tachycardic, tachypneic, with leukocytosis (all of which have resolved) -CTA chest negative for pulmonary embolism.  Patchy areas of groundglass and more confluent airspace opacity in the left upper and lower lobes consistent with multifocal pneumonia. -Blood cultures show no growth to date -Strep pneumonia urine antigen negative -pending Legionella urine antigen  -was placed on azithromycin, ceftriaxone -Patient has been maintaining his oxygen saturations in the high 90s on room air -Will discharge patient with azithromycin and Ceftin  Cough variant asthma/seasonal allergies -Continue Flonase, Xyzal, symbicort  Hypokalemia -Possibly secondary to chronic diarrhea vs diuretics  -Resolved with replacement, continue potassium supplementation up discharge  Essential hypertension -Continue metoprolol, verapamil, lasix  Diabetes mellitus, type II -Metformin held- may continue on discharge  Hyperlipidemia Continue statin  Gout -Stable, continue allopurinol  GERD, history of hiatal hernia -Continue PPI  Obstructive sleep apnea -Continue CPAP  Chronic diarrhea -Continue acidophilus -Has been ongoing for approximately 2 months -Discussed with patient the need for GI follow-up.  He is also overdue for his colonoscopy.  Pulmonary nodules -Noted on CTA  chest, appear to be stable and unchanged from CTA 05/13/2017 and 02/04/2017 -Considered benign  History  of pulmonary embolism -CTA as noted above -Continue Xarelto  Procedures: None  Consultations: None  Discharge Exam: Vitals:   05/10/18 1003 05/10/18 1004  BP:    Pulse:    Resp:    Temp:    SpO2: 98% 98%   Patient states his breathing as well as cough have improved.  Denies current shortness of breath.  Denies current chest pain, abdominal pain, nausea or vomiting, diarrhea or constipation, dizziness or headache.   General: Well developed, well nourished, NAD, appears stated age  HEENT: NCAT,  mucous membranes moist.  Neck: Supple  Cardiovascular: S1 S2 auscultated, no rubs, murmurs or gallops. Regular rate and rhythm.  Respiratory: Managed breath sounds, upper airway expiratory wheezing, otherwise clear  Abdomen: Soft, nontender, nondistended, + bowel sounds  Extremities: warm dry without cyanosis clubbing or edema  Neuro: AAOx3, nonfocal  Psych: Normal affect and demeanor with intact judgement and insight  Discharge Instructions Discharge Instructions    Discharge instructions   Complete by:  As directed    Patient will be discharged to home.  Patient will need to follow up with primary care provider within one week of discharge, repeat BMP. Follow up with gastroenterology.  Patient should continue medications as prescribed.  Patient should follow a heart healthy/carb modified diet.     Allergies as of 05/10/2018      Reactions   Bee Venom Anaphylaxis   Garlic Swelling      Medication List    STOP taking these medications   terazosin 5 MG capsule Commonly known as:  HYTRIN     TAKE these medications   acidophilus Caps capsule Take 1 capsule by mouth daily.   allopurinol 100 MG tablet Commonly known as:  ZYLOPRIM TAKE 1 TABLET BY MOUTH DAILY.   aspirin EC 81 MG tablet Take 81 mg by mouth daily.   azithromycin 250 MG tablet Commonly known as:   ZITHROMAX Take 1 tablet (250 mg total) by mouth daily for 3 days. Start taking on:  05/11/2018   budesonide-formoterol 80-4.5 MCG/ACT inhaler Commonly known as:  SYMBICORT Inhale 2 puffs 2 (two) times daily into the lungs. What changed:  when to take this   cefUROXime 500 MG tablet Commonly known as:  CEFTIN Take 1 tablet (500 mg total) by mouth 2 (two) times daily for 3 days. Start taking on:  05/11/2018   celecoxib 200 MG capsule Commonly known as:  CELEBREX TAKE 1 CAPSULE BY MOUTH TWICE DAILY What changed:    how much to take  how to take this  when to take this   cyclobenzaprine 5 MG tablet Commonly known as:  FLEXERIL Take 1 tablet (5 mg total) at bedtime by mouth. What changed:    when to take this  reasons to take this   EPIPEN 0.3 mg/0.3 mL Soaj injection Generic drug:  EPINEPHrine Inject 0.3 mg into the muscle once.   furosemide 20 MG tablet Commonly known as:  LASIX Take 1 tablet (20 mg total) by mouth daily.   gabapentin 300 MG capsule Commonly known as:  NEURONTIN Take 1 capsule (300 mg total) by mouth at bedtime. NEED APPOINTMENT FOR REFILLS   guaiFENesin-codeine 100-10 MG/5ML syrup Take 5 mLs by mouth every 6 (six) hours as needed for cough.   levocetirizine 5 MG tablet Commonly known as:  XYZAL TAKE 1 TABLET BY MOUTH EVERY EVENING. What changed:  Another medication with the same name was removed. Continue taking this medication, and follow the directions you see here.  magnesium oxide 400 (241.3 Mg) MG tablet Commonly known as:  MAG-OX Take 1 tablet (400 mg total) by mouth daily.   meclizine 25 MG tablet Commonly known as:  ANTIVERT Take 1 tablet (25 mg total) by mouth 3 (three) times daily as needed for dizziness.   memantine 10 MG tablet Commonly known as:  NAMENDA Take 2 tablets (20 mg total) by mouth daily.   metFORMIN 1000 MG tablet Commonly known as:  GLUCOPHAGE Take 1 tablet (1,000 mg total) by mouth 2 (two) times daily with a  meal.   metoprolol tartrate 25 MG tablet Commonly known as:  LOPRESSOR TAKE 1 TABLET BY MOUTH ONCE DAILY   mometasone 50 MCG/ACT nasal spray Commonly known as:  NASONEX Place 2 sprays into the nose daily. What changed:    when to take this  reasons to take this   ONE TOUCH ULTRA TEST test strip Generic drug:  glucose blood USE AS DIRECTED THREE TIMES DAILY   ONE-A-DAY WEIGHT SMART ADVANCE PO Take 1 tablet by mouth daily.   pantoprazole 40 MG tablet Commonly known as:  PROTONIX Take 1 tablet (40 mg total) by mouth 2 (two) times daily.   potassium chloride SA 20 MEQ tablet Commonly known as:  K-DUR,KLOR-CON Take 2 tablets (40 mEq total) by mouth daily. What changed:    how much to take  when to take this   pravastatin 10 MG tablet Commonly known as:  PRAVACHOL Take 1 tablet (10 mg total) by mouth daily.   albuterol (5 MG/ML) 0.5% nebulizer solution Commonly known as:  PROVENTIL Take 2.5 mg by nebulization every 6 (six) hours as needed for wheezing or shortness of breath.   PROAIR HFA 108 (90 Base) MCG/ACT inhaler Generic drug:  albuterol Inhale 1 puff into the lungs every 6 (six) hours as needed for wheezing or shortness of breath.   rivaroxaban 10 MG Tabs tablet Commonly known as:  XARELTO Take 1 tablet (10 mg total) by mouth daily.   SUMAtriptan 50 MG tablet Commonly known as:  IMITREX Take 1 tablet as needed for migraine/vertigo. Do not take more than 3 a week   topiramate 50 MG tablet Commonly known as:  TOPAMAX Take 1 tablet (50 mg total) by mouth 2 (two) times daily.   verapamil 240 MG CR tablet Commonly known as:  CALAN-SR TAKE 1 TABLET BY MOUTH AT BEDTIME.   vitamin B-12 500 MCG tablet Commonly known as:  CYANOCOBALAMIN Take 500 mcg by mouth daily.      Allergies  Allergen Reactions  . Bee Venom Anaphylaxis  . Garlic Swelling   Follow-up Information    Ann Held, DO. Schedule an appointment as soon as possible for a visit in  1 week(s).   Specialty:  Family Medicine Why:  Hospital follow up Contact information: [address] [place] [address] [phone]        Milus Banister, MD. Schedule an appointment as soon as possible for a visit in 4 week(s).   Specialty:  Gastroenterology Why:  Chronic diarrhea, colonoscopy Contact information: [address] [place] [address] [phone]            The results of significant diagnostics from this hospitalization (including imaging, microbiology, ancillary and laboratory) are listed below for reference.    Significant Diagnostic Studies: Dg Chest 2 View  Result Date: 05/08/2018 CLINICAL DATA:  Shortness of breath EXAM: CHEST - 2 VIEW COMPARISON:  Chest radiograph March 17, 2017  and May 13, 2017 chest CT FINDINGS: There is mild elevation of the right hemidiaphragm, stable. There is scarring in the left base region. There is no edema or consolidation. The heart size and pulmonary vascularity are normal. No adenopathy. There is a focal hiatal hernia. There is degenerative change in thoracic spine. There is postoperative change in the lower cervical region. IMPRESSION: Scarring left base. No edema or consolidation. Focal hiatal hernia. No evident adenopathy. Electronically Signed   By: Lowella Grip III M.D.   On: 05/08/2018 11:14   Ct Angio Chest Pe W/cm &/or Wo Cm  Result Date: 05/08/2018 CLINICAL DATA:  Short of breath and chest pain since midnight. EXAM: CT ANGIOGRAPHY CHEST WITH CONTRAST TECHNIQUE: Multidetector CT imaging of the chest was performed using the standard protocol during bolus administration of intravenous contrast. Multiplanar CT image reconstructions and MIPs were obtained to evaluate the vascular anatomy. CONTRAST:  130mL ISOVUE-370 IOPAMIDOL (ISOVUE-370) INJECTION 76% COMPARISON:  Current chest radiograph.  Chest CT, 05/13/2017. FINDINGS: Cardiovascular: There is satisfactory opacification of the pulmonary  arteries to the segmental level. There is no evidence of a pulmonary embolus. Great vessels are normal in caliber. No aortic dissection minimal atherosclerotic change along the descending thoracic aorta. Heart is normal in size. Mild left coronary artery calcifications. No pericardial effusion. Mediastinum/Nodes: No neck base, axillary, mediastinal or hilar masses or enlarged lymph nodes. Thyroid is unremarkable. Trachea is widely patent. Moderate hiatal hernia. Esophagus unremarkable. Lungs/Pleura: There are patchy areas of ground-glass and more confluent airspace opacity in a peribronchovascular distribution in the left upper lower lobes, suspicious for infection. Discoid type opacity is noted in the right lower lobe adjacent to the oblique fissure, most likely atelectasis. There are multiple bilateral lung nodules. There is a 6 mm pleural-based right upper lobe nodule, image 32, series 10. 3 mm pleural based pulmonary nodule in the right upper lobe, image 39. Mm pulmonary nodule right upper lobe, image 52. 5 mm pulmonary nodule right lower lobe, image 61. 6 mm nodule pleural-based in the right lower lobe, image 54. 5 mm pleural-based nodule in the left lower lobe, image 58. Nodules are stable from prior exam. No pleural effusion.  No pneumothorax. Upper Abdomen: No acute findings. Low-density liver lesions consistent with cysts, stable from the prior CT. Musculoskeletal: No fracture or acute finding. No osteoblastic or osteolytic lesions. Review of the MIP images confirms the above findings. IMPRESSION: 1. No evidence of a pulmonary embolism. 2. Patchy areas of ground-glass and more confluent airspace opacity in the left upper and lower lobes consistent multifocal pneumonia in proper clinical setting. 3. Multiple small pulmonary nodules without significant change from prior CT angiograms from 05/13/2017 and 02/04/2017. Given the stability over more than 1 year, these can be considered benign with no specific  follow-up imaging is recommended. Aortic Atherosclerosis (ICD10-I70.0). Electronically Signed   By: Lajean Manes M.D.   On: 05/08/2018 14:43    Microbiology: Recent Results (from the past 240 hour(s))  Culture, blood (routine x 2)     Status: None (Preliminary result)   Collection Time: 05/08/18  5:20 PM  Result Value Ref Range Status   Specimen Description   Final    BLOOD RIGHT ARM Performed at [facility], [place] [address] [place] [address], [place], [place] [address]    Special Requests   Final    BOTTLES DRAWN AEROBIC AND ANAEROBIC Blood Culture adequate volume Performed at [facility] [address], [place],  [address]    Culture  Final    NO GROWTH < 24 HOURS Performed at [facility], [place] [address] [place], [place] [address]    Report Status PENDING  Incomplete  Culture, blood (routine x 2)     Status: None (Preliminary result)   Collection Time: 05/08/18  5:28 PM  Result Value Ref Range Status   Specimen Description   Final    BLOOD LEFT ANTECUBITAL Performed at [facility] [address], [place], [place] [address]    Special Requests   Final    BOTTLES DRAWN AEROBIC AND ANAEROBIC Blood Culture adequate volume Performed at [facility] [address] [place], [place] [address]    Culture   Final    NO GROWTH < 24 HOURS Performed at [facility] [address] [place], [place] [address]    Report Status PENDING  Incomplete     Labs: Basic Metabolic Panel: Recent Labs  Lab 05/08/18 1250 05/09/18 0525  NA 142 143  K 3.3* 3.8  CL 110 111  CO2 18* 20*  GLUCOSE 205* 128*  BUN 15 12  CREATININE 1.13 1.00  CALCIUM 9.2 9.1   Liver Function Tests: No results for input(s): AST, ALT, ALKPHOS, BILITOT, PROT, ALBUMIN in the last 168 hours. No results for input(s): LIPASE, AMYLASE in the last 168 hours. No results for input(s): AMMONIA in the last 168 hours. CBC: Recent Labs    Lab 05/08/18 1250 05/09/18 0525  WBC 17.4* 11.7*  HGB 11.9* 11.4*  HCT 36.6* 36.7*  MCV 84.9 85.9  PLT 264 257   Cardiac Enzymes: No results for input(s): CKTOTAL, CKMB, CKMBINDEX, TROPONINI in the last 168 hours. BNP: BNP (last 3 results) No results for input(s): BNP in the last 8760 hours.  ProBNP (last 3 results) No results for input(s): PROBNP in the last 8760 hours.  CBG: Recent Labs  Lab 05/08/18 2233 05/09/18 0731 05/09/18 1158 05/09/18 1731 05/10/18 0736  GLUCAP 134* 110* 135* 95 182*       Signed:  [facility] 05/10/2018, 10:50 AM

## 2018-05-10 NOTE — Progress Notes (Signed)
Gi

## 2018-05-11 ENCOUNTER — Telehealth: Payer: Self-pay

## 2018-05-11 LAB — GLUCOSE, CAPILLARY: Glucose-Capillary: 138 mg/dL — ABNORMAL HIGH (ref 65–99)

## 2018-05-11 LAB — LEGIONELLA PNEUMOPHILA SEROGP 1 UR AG: L. PNEUMOPHILA SEROGP 1 UR AG: NEGATIVE

## 2018-05-11 MED FILL — GUAIATUSSIN AC LIQUID: 100-10 | 6 days supply | Qty: 120 | Fill #0

## 2018-05-11 MED FILL — AZITHROMYCIN 250 MG TABS: 250 | 3 days supply | Qty: 3 | Fill #0

## 2018-05-11 MED FILL — CEFUROXIME AXETIL 500 MG TA: 500 | 3 days supply | Qty: 6 | Fill #0

## 2018-05-11 NOTE — Telephone Encounter (Signed)
Transition Care Management Follow-up Telephone Call  ADMISSION DATE: 05/08/18  DISCHARGE DATE: 05/10/18   How have you been since you were released from the hospital? Patient states he has been ok otter than SOB and weakness. States he is using his inhalers for SOB.  Do you understand why you were in the hospital? Yes per patient.    Do you understand the discharge instrcutions? Yes per patient.    Items Reviewed:  Medications reviewed: Yes   Allergies reviewed: Yes   Dietary changes reviewed: Yes, Heart health, Carbohydrate modified.   Referrals reviewed: Follow up appointment scheduled with Dr. Carollee Herter, Patient has appointment with Dr. [name] tomorrow.   Functional Questionnaire:   Activities of Daily Living (ADLs):P atient states he is able to perform all.  Any patient concerns? Has has loose stools. Will discuss with Dr. [name] tomorrow.   Confirmed importance and date/time of follow-up visits scheduled: Yes   Confirmed with patient if condition begins to worsen call PCP or go to the ER. Yes    Patient was given the office number and encouragred to call back with questions or concerns. Yes

## 2018-05-12 ENCOUNTER — Telehealth: Payer: Self-pay | Admitting: Internal Medicine

## 2018-05-12 ENCOUNTER — Ambulatory Visit (INDEPENDENT_AMBULATORY_CARE_PROVIDER_SITE_OTHER): Payer: PPO | Admitting: Gastroenterology

## 2018-05-12 ENCOUNTER — Encounter: Payer: Self-pay | Admitting: Gastroenterology

## 2018-05-12 ENCOUNTER — Other Ambulatory Visit: Payer: PPO

## 2018-05-12 VITALS — BP 130/78 | HR 84 | Ht 69.0 in | Wt 229.5 lb

## 2018-05-12 DIAGNOSIS — R197 Diarrhea, unspecified: Secondary | ICD-10-CM

## 2018-05-12 NOTE — Telephone Encounter (Signed)
Spoke with pt, advised message from CY. Pt understood and will cancel appt. Nothing further is needed.

## 2018-05-12 NOTE — Telephone Encounter (Signed)
Yes - Ok to cancel ct

## 2018-05-12 NOTE — Telephone Encounter (Signed)
Called and spoke to pt.  Pt stated he is scheduled for CT wo tomorrow, however during his recent admission over the weekend he had a CTA. Pt is wanting to know if it CT scheduled for tomorrow can be canceled.   MW please advise. Thanks  12/12/17 AVS:  Patient Instructions by Tanda Rockers, MD at 12/12/2017 9:00 AM   Author: Tanda Rockers, MD Author Type: Physician Filed: 12/12/2017 9:20 AM  Note Status: Signed Cosign: Cosign Not Required Encounter Date: 12/12/2017  Editor: Tanda Rockers, MD (Physician)    .Plan A = Automatic =  Continue symbicort 80 Take 2 puffs first thing in am and then another 2 puffs about 12 hours later.    Plan B = Backup Only use your albuterol (PROAIR)as a rescue medication to be used if you can't catch your breath by resting or doing a relaxed purse lip breathing pattern.  - The less you use it, the better it will work when you need it. - Ok to use the inhaler up to 2 puffs  every 4 hours if you must but call for appointment if use goes up over your usual need - Don't leave home without it !!  (think of it like the spare tire for your car)   Plan C = Crisis - only use your albuterol nebulizer if you first try Plan B and it fails to help > ok to use the nebulizer up to every 4 hours but if start needing it regularly call for immediate appointment   Continue protonix (pantoprazole) 40 mg Take 30- 60 min before your first and last meals of the day and add Pepcid ac 20 mg at bedtime    We will set you up for follow up by Sleep medicine since Dr Gwenette Greet is not availabe > 6 months is fine    If you are satisfied with your treatment plan,  let your doctor know and he/she can either refill your medications or you can return here when your prescription runs out.     If in any way you are not 100% satisfied,  please tell us.  If 100% better, tell your friends!  Pulmonary follow up is as needed    .

## 2018-05-12 NOTE — Patient Instructions (Addendum)
Decrease meformin to once daily. You will have labs checked today in the basement lab.  Please head down after you check out with the front desk  ( stool for GI pathogen panel, ova and parasites, routine stool culture. Call in 4 weeks to report on your response.  Normal BMI (Body Mass Index- based on height and weight) is between 23 and 30. Your BMI today is Body mass index is 33.89 kg/m. Marland Kitchen Please consider follow up  regarding your BMI with your Primary Care Provider.
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
## 2018-05-13 ENCOUNTER — Other Ambulatory Visit: Payer: PPO

## 2018-05-13 ENCOUNTER — Ambulatory Visit: Payer: Self-pay | Admitting: *Deleted

## 2018-05-13 DIAGNOSIS — R197 Diarrhea, unspecified: Secondary | ICD-10-CM

## 2018-05-13 LAB — CULTURE, BLOOD (ROUTINE X 2)
Culture: NO GROWTH
Culture: NO GROWTH
Special Requests: ADEQUATE
Special Requests: ADEQUATE

## 2018-05-13 NOTE — Telephone Encounter (Signed)
Pt called with complaints of chest congestion and he "can't cough anything up";; he says that he feels congestion in his lungs; the pt states that he was in the hospital over the weekend and was discharged on Sunday 05/10/18; however on Monday 05/11/18 pm he started to not feel well again; the pt has a hospital follow up visit with Dr Etter Sjogren on 05/21/18; he says he has used his proair and symbicort inhalers but he is not feeling better; the pt says that he will see if his nebulizer works;  Marine scientist triage initiated and recommenda- tions made per  protocol to include going to see PCP; Dr Etter Sjogren nor any providers have appointments available today; the pt says that he is willing to wait until tomorrow but will call back if he feels worse;  pt offered and accepted appointment with Mackie Pai, [facility] on 05/14/18 at [place] spoke with Natividad Brood at [facility] and instructions were given to cancel previous appointment with Dr Etter Sjogren and reschedule based on recommendations from Mackie Pai   will route to office for notification of this encounter and upcoming appointment     Reason for Disposition . MODERATE difficulty breathing (e.g., speaks in phrases, SOB even at rest, pulse 100-120)  Answer Assessment - Initial Assessment Questions 1. SYMPTOM: "What's the main symptom you're concerned about?" (e.g., breathing difficulty, fever, weakness)     Difficulty breathing 2. ONSET: "When did the  ________  start?"     05/11/18 3. BETTER-SAME-WORSE: "Are you getting better, staying the same, or getting worse compared to the day you were discharged?"     worse 4. HOSPITALIZATION: "How long were you hospitalized?" (e.g., days)    05/08/18 5. DISCHARGE DATE: "What date were you discharged from the hospital?"     05/10/18 6. DISCHARGE DOCTOR: "Who is the main doctor taking care of you now?"      7. DISCHARGE APPOINTMENT: "Have you scheduled a follow-up discharge appointment with your doctor?"     05/21/18 8. DISCHARGE  MEDICATIONS: "Did the physician who discharged you order any new medications for you to use? If yes, have you filled the prescription and started taking the medication?"      no 9. DISCHARGE ANTIBIOTICS: "Are you taking any antibiotic medication now?"   Azithromycin 1 tablet 250 mg daily start 05/11/18 x 3 days Cefuroxime 1 tablet 500 mg 2 times daily start 05/11/18 x 3 days  10. BREATHING DIFFICULTY: "Are you having any difficulty breathing?" If so, ask "How bad is it?"  (e.g., none, mild, moderate, severe)   - MILD: No SOB at rest, mild SOB with walking, speaks normally in sentences, can lay down, no retractions, pulse < 100.    - MODERATE: SOB at rest, SOB with minimal exertion and prefers to sit, cannot lie down flat, speaks in phrases, mild retractions, audible wheezing, pulse 100-120.    - SEVERE: Very SOB at rest, speaks in single words, struggling to breathe, sitting hunched forward, retractions, pulse > 120        moderate 11. FEVER: "Do you have a fever?" If so, ask: "What is it, how was it measured and when did it start?"       no 12. OTHER SYMPTOMS: "Do you have any other symptoms?" (e.g., weakness, confusion, pain)       Congestion in chest  Protocols used: PNEUMONIA ON ANTIBIOTIC POST-HOSPITALIZATION FOLLOW-UP CALL-A-AH

## 2018-05-14 ENCOUNTER — Ambulatory Visit
Admission: RE | Admit: 2018-05-14 | Discharge: 2018-05-14 | Disposition: A | Discharge status: Home / Self Care | Payer: PPO | Source: Ambulatory Visit | Attending: Neurology | Admitting: Neurology

## 2018-05-14 ENCOUNTER — Ambulatory Visit (HOSPITAL_BASED_OUTPATIENT_CLINIC_OR_DEPARTMENT_OTHER)
Admission: RE | Admit: 2018-05-14 | Discharge: 2018-05-14 | Disposition: A | Discharge status: Home / Self Care | Payer: PPO | Source: Ambulatory Visit | Attending: Medical | Admitting: Medical

## 2018-05-14 ENCOUNTER — Encounter: Payer: Self-pay | Admitting: Medical

## 2018-05-14 ENCOUNTER — Ambulatory Visit (INDEPENDENT_AMBULATORY_CARE_PROVIDER_SITE_OTHER): Payer: PPO | Admitting: Medical

## 2018-05-14 VITALS — BP 122/75 | HR 81 | Temp 98.2°F | Ht 69.0 in | Wt 232.0 lb

## 2018-05-14 DIAGNOSIS — M5442 Lumbago with sciatica, left side: Secondary | ICD-10-CM

## 2018-05-14 DIAGNOSIS — R062 Wheezing: Secondary | ICD-10-CM

## 2018-05-14 DIAGNOSIS — J984 Other disorders of lung: Secondary | ICD-10-CM | POA: Insufficient documentation

## 2018-05-14 DIAGNOSIS — J189 Pneumonia, unspecified organism: Secondary | ICD-10-CM

## 2018-05-14 DIAGNOSIS — G43109 Migraine with aura, not intractable, without status migrainosus: Secondary | ICD-10-CM

## 2018-05-14 DIAGNOSIS — G629 Polyneuropathy, unspecified: Secondary | ICD-10-CM

## 2018-05-14 DIAGNOSIS — R05 Cough: Secondary | ICD-10-CM

## 2018-05-14 DIAGNOSIS — R0600 Dyspnea, unspecified: Secondary | ICD-10-CM | POA: Insufficient documentation

## 2018-05-14 DIAGNOSIS — R059 Cough, unspecified: Secondary | ICD-10-CM

## 2018-05-14 DIAGNOSIS — M5441 Lumbago with sciatica, right side: Secondary | ICD-10-CM

## 2018-05-14 DIAGNOSIS — R251 Tremor, unspecified: Secondary | ICD-10-CM | POA: Diagnosis not present

## 2018-05-14 DIAGNOSIS — R2689 Other abnormalities of gait and mobility: Secondary | ICD-10-CM

## 2018-05-14 DIAGNOSIS — R0602 Shortness of breath: Secondary | ICD-10-CM | POA: Diagnosis not present

## 2018-05-14 LAB — CBC WITH DIFFERENTIAL/PLATELET
BASOS ABS: 0.1 10*3/uL (ref 0.0–0.1)
Basophils Relative: 0.5 % (ref 0.0–3.0)
EOS ABS: 0.1 10*3/uL (ref 0.0–0.7)
Eosinophils Relative: 1.3 % (ref 0.0–5.0)
HEMATOCRIT: 37.6 % — AB (ref 39.0–52.0)
Hemoglobin: 12.2 g/dL — ABNORMAL LOW (ref 13.0–17.0)
LYMPHS PCT: 17.2 % (ref 12.0–46.0)
Lymphs Abs: 1.8 10*3/uL (ref 0.7–4.0)
MCHC: 32.6 g/dL (ref 30.0–36.0)
MCV: 83.8 fl (ref 78.0–100.0)
MONO ABS: 0.5 10*3/uL (ref 0.1–1.0)
Monocytes Relative: 4.8 % (ref 3.0–12.0)
NEUTROS ABS: 8 10*3/uL — AB (ref 1.4–7.7)
Neutrophils Relative %: 76.2 % (ref 43.0–77.0)
PLATELETS: 297 10*3/uL (ref 150.0–400.0)
RBC: 4.48 Mil/uL (ref 4.22–5.81)
RDW: 17.5 % — ABNORMAL HIGH (ref 11.5–15.5)
WBC: 10.5 10*3/uL (ref 4.0–10.5)

## 2018-05-14 LAB — COMPREHENSIVE METABOLIC PANEL
ALT: 45 U/L (ref 0–53)
AST: 24 U/L (ref 0–37)
Albumin: 4.1 g/dL (ref 3.5–5.2)
Alkaline Phosphatase: 80 U/L (ref 39–117)
BILIRUBIN TOTAL: 0.3 mg/dL (ref 0.2–1.2)
BUN: 17 mg/dL (ref 6–23)
CALCIUM: 9.6 mg/dL (ref 8.4–10.5)
CO2: 25 mEq/L (ref 19–32)
CREATININE: 1.09 mg/dL (ref 0.40–1.50)
Chloride: 109 mEq/L (ref 96–112)
GFR: 71.48 mL/min (ref >60.00)
GLUCOSE: 139 mg/dL — AB (ref 70–99)
Potassium: 4.4 mEq/L (ref 3.5–5.1)
SODIUM: 141 meq/L (ref 135–145)
Total Protein: 7.3 g/dL (ref 6.0–8.3)

## 2018-05-14 LAB — BRAIN NATRIURETIC PEPTIDE: PRO B NATRI PEPTIDE: 33 pg/mL (ref 0.0–100.0)

## 2018-05-14 LAB — TROPONIN I: TNIDX: 0.01 ug/l (ref 0.00–0.06)

## 2018-05-14 MED ORDER — CEFTRIAXONE SODIUM 1 G IJ SOLR
1.0000 g | Freq: Once | INTRAMUSCULAR | Status: AC
  Administered 2018-05-14: 1 g via INTRAMUSCULAR

## 2018-05-14 MED ORDER — PREDNISONE 10 MG PO TABS: ORAL_TABLET | ORAL | 0 refills | Status: DC

## 2018-05-14 MED ORDER — GADOBENATE DIMEGLUMINE 529 MG/ML IV SOLN
20.0000 mL | Freq: Once | INTRAVENOUS | Status: AC | PRN
  Administered 2018-05-14: 20 mL via INTRAVENOUS

## 2018-05-14 MED FILL — predniSONE 10 MG TABS: 10 | 5 days supply | Qty: 15 | Fill #0
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
## 2018-05-14 NOTE — Patient Instructions (Addendum)
You  had recent pneumonia and were  hospitalized.  You describe significant shortness of breath on Monday but since using the albuterol/neb treatments you have been doing well.  Your oxygen percentages 98% when CMA check.  But still reporting intermittent shortness of breath with cough.  Some chills last night.  We gave you Rocephin 1 g IM today.  Continue the Ceftin.  I will follow a chest x-ray and determine if you need additional antibiotic.  You Finished the Z-Pak hospital gave you.  For your shortness of breath, I recommend that you continue Symbicort and neb treatments.  If your wheezing or shortness of breath worsen over the weekend then I do want to add 5-day taper dose of prednisone.  This is provided as a printed prescription.  Only want you to use this if needed.  You are diabetic and this could increase her blood sugar levels.  So in the event have to use prednisone and your sugars increase over 100 then use the NovoLog.  We gave novolog insulin to follow pre-meal sliding scale.  He would use NovoLog only if sugars exceed 200 pre-meal.  EKG done today in office.(sinus rhythm. Compared to most recent. No ischemic changes)  Also get CBC, CMP, BNP and troponin today.(If labs were ordered stat)  We do have a long 3-day weekend upcoming.  If your symptoms worsen or change despite the above measures over the weekend then recommend ED evaluation  Follow-up in 6-8 days or as needed

## 2018-05-15 ENCOUNTER — Other Ambulatory Visit: Payer: Self-pay | Admitting: *Deleted

## 2018-05-15 DIAGNOSIS — I2699 Other pulmonary embolism without acute cor pulmonale: Secondary | ICD-10-CM

## 2018-05-19 ENCOUNTER — Inpatient Hospital Stay: Payer: PPO

## 2018-05-19 ENCOUNTER — Inpatient Hospital Stay: Payer: PPO | Attending: Hematology & Oncology | Admitting: Hematology & Oncology

## 2018-05-19 ENCOUNTER — Other Ambulatory Visit: Payer: Self-pay | Admitting: Family Medicine

## 2018-05-19 ENCOUNTER — Encounter: Payer: Self-pay | Admitting: Medical

## 2018-05-19 VITALS — BP 133/69 | HR 68 | Temp 98.0°F | Resp 18 | Wt 231.0 lb

## 2018-05-19 DIAGNOSIS — Z7982 Long term (current) use of aspirin: Secondary | ICD-10-CM

## 2018-05-19 DIAGNOSIS — Z7901 Long term (current) use of anticoagulants: Secondary | ICD-10-CM

## 2018-05-19 DIAGNOSIS — Z7984 Long term (current) use of oral hypoglycemic drugs: Secondary | ICD-10-CM | POA: Diagnosis not present

## 2018-05-19 DIAGNOSIS — R42 Dizziness and giddiness: Secondary | ICD-10-CM | POA: Diagnosis not present

## 2018-05-19 DIAGNOSIS — I739 Peripheral vascular disease, unspecified: Secondary | ICD-10-CM | POA: Diagnosis not present

## 2018-05-19 DIAGNOSIS — I2699 Other pulmonary embolism without acute cor pulmonale: Secondary | ICD-10-CM | POA: Diagnosis not present

## 2018-05-19 DIAGNOSIS — Z79899 Other long term (current) drug therapy: Secondary | ICD-10-CM

## 2018-05-19 DIAGNOSIS — D6862 Lupus anticoagulant syndrome: Secondary | ICD-10-CM

## 2018-05-19 DIAGNOSIS — Z8701 Personal history of pneumonia (recurrent): Secondary | ICD-10-CM

## 2018-05-19 DIAGNOSIS — D500 Iron deficiency anemia secondary to blood loss (chronic): Secondary | ICD-10-CM

## 2018-05-19 LAB — CMP (CANCER CENTER ONLY)
ALBUMIN: 3.7 g/dL (ref 3.5–5.0)
ALK PHOS: 85 U/L — AB (ref 26–84)
ALT: 53 U/L — ABNORMAL HIGH (ref 10–47)
ANION GAP: 12 (ref 5–15)
AST: 26 U/L (ref 11–38)
BILIRUBIN TOTAL: 0.6 mg/dL (ref 0.2–1.6)
BUN: 19 mg/dL (ref 7–22)
CALCIUM: 9.1 mg/dL (ref 8.0–10.3)
CO2: 22 mmol/L (ref 18–33)
CREATININE: 1.2 mg/dL (ref 0.60–1.20)
Chloride: 110 mmol/L — ABNORMAL HIGH (ref 98–108)
Glucose, Bld: 199 mg/dL — ABNORMAL HIGH (ref 73–118)
Potassium: 3.8 mmol/L (ref 3.3–4.7)
Sodium: 144 mmol/L (ref 128–145)
TOTAL PROTEIN: 7.3 g/dL (ref 6.4–8.1)

## 2018-05-19 LAB — STOOL CULTURE
MICRO NUMBER: 90622196
MICRO NUMBER:: 90622193
MICRO NUMBER:: 90622194
SHIGA RESULT: NOT DETECTED
SPECIMEN QUALITY: ADEQUATE
SPECIMEN QUALITY: ADEQUATE
SPECIMEN QUALITY:: ADEQUATE

## 2018-05-19 LAB — CBC WITH DIFFERENTIAL (CANCER CENTER ONLY)
BASOS ABS: 0 10*3/uL (ref 0.0–0.1)
Basophils Relative: 0 %
Eosinophils Absolute: 0 10*3/uL (ref 0.0–0.5)
Eosinophils Relative: 0 %
HEMATOCRIT: 37.5 % — AB (ref 38.7–49.9)
HEMOGLOBIN: 12 g/dL — AB (ref 13.0–17.1)
LYMPHS PCT: 10 %
Lymphs Abs: 1.4 10*3/uL (ref 0.9–3.3)
MCH: 27.6 pg — ABNORMAL LOW (ref 28.0–33.4)
MCHC: 32 g/dL (ref 32.0–35.9)
MCV: 86.4 fL (ref 82.0–98.0)
MONOS PCT: 4 %
Monocytes Absolute: 0.5 10*3/uL (ref 0.1–0.9)
NEUTROS ABS: 12.2 10*3/uL — AB (ref 1.5–6.5)
NEUTROS PCT: 86 %
Platelet Count: 302 10*3/uL (ref 145–400)
RBC: 4.34 MIL/uL (ref 4.20–5.70)
RDW: 16.5 % — ABNORMAL HIGH (ref 11.1–15.7)
WBC Count: 14.1 10*3/uL — ABNORMAL HIGH (ref 4.0–10.0)

## 2018-05-19 LAB — GASTROINTESTINAL PATHOGEN PANEL PCR
C. difficile Tox A/B, PCR: NOT DETECTED
CAMPYLOBACTER, PCR: NOT DETECTED
Cryptosporidium, PCR: NOT DETECTED
E coli (ETEC) LT/ST PCR: NOT DETECTED
E coli (STEC) stx1/stx2, PCR: NOT DETECTED
E coli 0157, PCR: NOT DETECTED
GIARDIA LAMBLIA, PCR: NOT DETECTED
NOROVIRUS, PCR: NOT DETECTED
Rotavirus A, PCR: NOT DETECTED
SALMONELLA, PCR: NOT DETECTED
SHIGELLA, PCR: NOT DETECTED

## 2018-05-19 LAB — OVA AND PARASITE EXAMINATION
CONCENTRATE RESULT:: NONE SEEN
SPECIMEN QUALITY:: ADEQUATE
TRICHROME RESULT:: NONE SEEN
VKL: 90622195

## 2018-05-19 LAB — D-DIMER, QUANTITATIVE (NOT AT ARMC): D DIMER QUANT: 0.37 ug{FEU}/mL (ref 0.00–0.50)

## 2018-05-19 MED FILL — CELECOXIB 200 MG CAPSULE: 200 | 90 days supply | Qty: 180 | Fill #0

## 2018-05-19 NOTE — Progress Notes (Signed)
Hematology and Oncology Follow Up Visit  Garrett [name] [id number] 07-15-1951 67 y.o. 05/19/2018   Principle Diagnosis:   Bilateral pulmonary emboli-no cor pulmonale  Lupus Anticoagulant (+)  Current Therapy:    Xarelto 20 mg by mouth daily-completed 9 months in 10/2017  Xarelto 10 mg po q day - maintanenece to complete 1 yr in 11/2018.  EC ASA 81 mg po q day     Interim History:  Garrett [name] is back for follow-up.  He has had quite a few issues since we saw him 6 months ago.  He was just discharged from the hospital.  He had pneumonia on the left side.  He was then for a couple days.  He has had his issues with dizziness.  He says he has had some falling episodes.  He is seeing a clinic at [facility] for dizziness.  He says that they have been running some tests.  An MRI of the brain a few days ago.  This just showed some chronic small vessel disease.  Everything else looked fine.  He still is working.  He works in side.  He has had no nausea or vomiting.  He has had no bleeding.  He is on maintenance Xarelto right now.  We were checking him for the lupus anticoagulant.  He is on low-dose aspirin.  He probably will need full dose aspirin once he completes maintenance Xarelto in December.   Overall, his performance status is ECOG 1.   Medications:  Current Outpatient Medications:  .  acidophilus (RISAQUAD) CAPS capsule, Take 1 capsule by mouth daily. , Disp: , Rfl:  .  albuterol (PROVENTIL) (5 MG/ML) 0.5% nebulizer solution, Take 2.5 mg by nebulization every 6 (six) hours as needed for wheezing or shortness of breath., Disp: , Rfl:  .  allopurinol (ZYLOPRIM) 100 MG tablet, TAKE 1 TABLET BY MOUTH DAILY., Disp: 90 tablet, Rfl: 3 .  aspirin EC 81 MG tablet, Take 81 mg by mouth daily., Disp: , Rfl:  .  budesonide-formoterol (SYMBICORT) 80-4.5 MCG/ACT inhaler, Inhale 2 puffs 2 (two) times daily into the lungs. (Patient taking differently: Inhale 2 puffs into the lungs daily. ),  Disp: 1 Inhaler, Rfl: 11 .  celecoxib (CELEBREX) 200 MG capsule, TAKE 1 CAPSULE BY MOUTH TWICE DAILY (Patient taking differently: TAKE 2 CAPSULES BY MOUTH DAILY), Disp: 180 capsule, Rfl: 0 .  cyanocobalamin 500 MCG tablet, Take 500 mcg by mouth daily.  , Disp: , Rfl:  .  cyclobenzaprine (FLEXERIL) 5 MG tablet, Take 1 tablet (5 mg total) at bedtime by mouth. (Patient taking differently: Take 5 mg by mouth daily as needed for muscle spasms. ), Disp: 7 tablet, Rfl: 0 .  EPINEPHrine (EPIPEN) 0.3 mg/0.3 mL SOAJ injection, Inject 0.3 mg into the muscle once., Disp: , Rfl:  .  furosemide (LASIX) 20 MG tablet, Take 1 tablet (20 mg total) by mouth daily., Disp: 90 tablet, Rfl: 3 .  gabapentin (NEURONTIN) 300 MG capsule, Take 1 capsule (300 mg total) by mouth at bedtime. NEED APPOINTMENT FOR REFILLS, Disp: 90 capsule, Rfl: 0 .  guaiFENesin-codeine 100-10 MG/5ML syrup, Take 5 mLs by mouth every 6 (six) hours as needed for cough., Disp: 120 mL, Rfl: 0 .  levocetirizine (XYZAL) 5 MG tablet, TAKE 1 TABLET BY MOUTH EVERY EVENING., Disp: 30 tablet, Rfl: 3 .  magnesium oxide (MAG-OX) 400 (241.3 Mg) MG tablet, Take 1 tablet (400 mg total) by mouth daily., Disp: 30 tablet, Rfl: 1 .  meclizine (ANTIVERT) 25 MG  tablet, Take 1 tablet (25 mg total) by mouth 3 (three) times daily as needed for dizziness., Disp: 30 tablet, Rfl: 0 .  memantine (NAMENDA) 10 MG tablet, Take 2 tablets (20 mg total) by mouth daily., Disp: 180 tablet, Rfl: 3 .  metFORMIN (GLUCOPHAGE) 1000 MG tablet, Take 1 tablet (1,000 mg total) by mouth 2 (two) times daily with a meal. (Patient taking differently: Take 500 mg by mouth 2 (two) times daily with a meal. ), Disp: 180 tablet, Rfl: 3 .  metoprolol tartrate (LOPRESSOR) 25 MG tablet, TAKE 1 TABLET BY MOUTH ONCE DAILY, Disp: 90 tablet, Rfl: 1 .  mometasone (NASONEX) 50 MCG/ACT nasal spray, Place 2 sprays into the nose daily. (Patient taking differently: Place 2 sprays into the nose daily as needed  (congestion). ), Disp: 51 g, Rfl: 2 .  Multiple Vitamins-Minerals (ONE-A-DAY WEIGHT SMART ADVANCE PO), Take 1 tablet by mouth daily. , Disp: , Rfl:  .  ONE TOUCH ULTRA TEST test strip, USE AS DIRECTED THREE TIMES DAILY, Disp: 100 each, Rfl: 11 .  pantoprazole (PROTONIX) 40 MG tablet, Take 1 tablet (40 mg total) by mouth 2 (two) times daily., Disp: 180 tablet, Rfl: 3 .  potassium chloride SA (K-DUR,KLOR-CON) 20 MEQ tablet, Take 2 tablets (40 mEq total) by mouth daily. (Patient taking differently: Take 20 mEq by mouth 2 (two) times daily. ), Disp: 180 tablet, Rfl: 1 .  pravastatin (PRAVACHOL) 10 MG tablet, Take 1 tablet (10 mg total) by mouth daily., Disp: 90 tablet, Rfl: 3 .  predniSONE (DELTASONE) 10 MG tablet, 5 TAB PO DAY 1 4 TAB PO DAY 2 3 TAB PO DAY 3 2 TAB PO DAY 4 1 TAB PO DAY 5, Disp: 15 tablet, Rfl: 0 .  PROAIR HFA 108 (90 Base) MCG/ACT inhaler, Inhale 1 puff into the lungs every 6 (six) hours as needed for wheezing or shortness of breath. , Disp: , Rfl:  .  rivaroxaban (XARELTO) 10 MG TABS tablet, Take 1 tablet (10 mg total) by mouth daily., Disp: 90 tablet, Rfl: 3 .  SUMAtriptan (IMITREX) 50 MG tablet, Take 1 tablet as needed for migraine/vertigo. Do not take more than 3 a week, Disp: 10 tablet, Rfl: 6 .  topiramate (TOPAMAX) 50 MG tablet, Take 1 tablet (50 mg total) by mouth 2 (two) times daily., Disp: 180 tablet, Rfl: 3 .  verapamil (CALAN-SR) 240 MG CR tablet, TAKE 1 TABLET BY MOUTH AT BEDTIME., Disp: 90 tablet, Rfl: 1  Allergies:  Allergies  Allergen Reactions  . Bee Venom Anaphylaxis  . Garlic Swelling    Past Medical History, Surgical history, Social history, and Family History were reviewed and updated.  Review of Systems: Review of Systems  Constitutional: Negative.   HENT: Negative.   Eyes: Negative.   Respiratory: Negative.   Cardiovascular: Negative.   Gastrointestinal: Negative.   Genitourinary: Negative.   Musculoskeletal: Negative.   Skin: Negative.     Neurological: Negative.   Endo/Heme/Allergies: Negative.   Psychiatric/Behavioral: Negative.      Physical Exam:  weight is 231 lb (104.8 kg). His oral temperature is 98 F (36.7 C). His blood pressure is 133/69 and his pulse is 68. His respiration is 18 and oxygen saturation is 98%.   Wt Readings from Last 3 Encounters:  05/19/18 231 lb (104.8 kg)  05/14/18 232 lb (105.2 kg)  05/12/18 229 lb 8 oz (104.1 kg)     Physical Exam  Constitutional: He is oriented to person, place, and time.  HENT:  Head: Normocephalic and atraumatic.  Mouth/Throat: Oropharynx is clear and moist.  Eyes: Pupils are equal, round, and reactive to light. EOM are normal.  Neck: Normal range of motion.  Cardiovascular: Normal rate, regular rhythm and normal heart sounds.  Pulmonary/Chest: Effort normal and breath sounds normal.  Abdominal: Soft. Bowel sounds are normal.  Musculoskeletal: Normal range of motion. He exhibits no edema, tenderness or deformity.  Lymphadenopathy:    He has no cervical adenopathy.  Neurological: He is alert and oriented to person, place, and time.  Skin: Skin is warm and dry. No rash noted. No erythema.  Psychiatric: He has a normal mood and affect. His behavior is normal. Judgment and thought content normal.  Vitals reviewed.    Lab Results  Component Value Date   WBC 14.1 (H) 05/19/2018   HGB 12.0 (L) 05/19/2018   HCT 37.5 (L) 05/19/2018   MCV 86.4 05/19/2018   PLT 302 05/19/2018     Chemistry      Component Value Date/Time   NA 144 05/19/2018 0907   NA 146 (H) 12/04/2017 1418   K 3.8 05/19/2018 0907   K 3.4 12/04/2017 1418   CL 110 (H) 05/19/2018 0907   CL 105 12/04/2017 1418   CO2 22 05/19/2018 0907   CO2 24 12/04/2017 1418   BUN 19 05/19/2018 0907   BUN 11 12/04/2017 1418   CREATININE 1.20 05/19/2018 0907   CREATININE 1.10 04/28/2018 1016      Component Value Date/Time   CALCIUM 9.1 05/19/2018 0907   CALCIUM 8.9 12/04/2017 1418   ALKPHOS 85 (H)  05/19/2018 0907   ALKPHOS 103 (H) 12/04/2017 1418   AST 26 05/19/2018 0907   ALT 53 (H) 05/19/2018 0907   ALT 51 (H) 12/04/2017 1418   BILITOT 0.6 05/19/2018 0907         Impression and Plan: Garrett [name] is a 67 year old gentleman with bilateral pulmonary emboli.  He is positive for the lupus anticoagulant.   again, he is doing okay from my point of view.  I am glad that he is still doing okay with his back.  I will see him back in 6 months.  At that time, we will see about getting him off the Xarelto and may be start him on full dose aspirin. [name], MD 5/28/201910:27 AM

## 2018-05-21 ENCOUNTER — Inpatient Hospital Stay: Payer: PPO | Admitting: Family Medicine

## 2018-05-21 ENCOUNTER — Telehealth: Payer: Self-pay

## 2018-05-21 LAB — DRVVT MIX: DRVVT MIX: 68.2 s — AB (ref 0.0–47.0)

## 2018-05-21 LAB — LUPUS ANTICOAGULANT PANEL
DRVVT: 94.2 s — ABNORMAL HIGH (ref 0.0–47.0)
PTT Lupus Anticoagulant: 41.5 s (ref 0.0–51.9)

## 2018-05-21 LAB — DRVVT CONFIRM: DRVVT CONFIRM: 1.7 ratio — AB (ref 0.8–1.2)

## 2018-05-21 NOTE — Telephone Encounter (Signed)
Message relayed via MyChart

## 2018-05-21 NOTE — Telephone Encounter (Signed)
-----   Message from Cameron Sprang, MD sent at 05/19/2018 12:29 PM EDT ----- Pls let him know brain MRI overall looked good, no evidence of tumor, stroke, or bleed. His nerve test did confirm neuropathy, as well as pinched nerves in his back, the combination of both is the likely cause of sensation of imbalance. Is the higher dose of Topamax helping with the episodes of dizziness? Thanks

## 2018-05-26 MED FILL — POTASSIUM CL ER 20 MEQ TABL: 20 | 45 days supply | Qty: 90 | Fill #0

## 2018-05-26 MED FILL — LEVOCETIRIZINE 5 MG TABLET: 5 | 30 days supply | Qty: 30 | Fill #3

## 2018-06-02 MED FILL — XARELTO 10 MG TABLET: 10 | 30 days supply | Qty: 30 | Fill #6

## 2018-06-02 MED FILL — METOPROLOL TARTRATE 25 MG T: 25 | 90 days supply | Qty: 90 | Fill #1

## 2018-06-11 ENCOUNTER — Ambulatory Visit: Payer: PPO | Admitting: Physician Assistant

## 2018-06-12 ENCOUNTER — Institutional Professional Consult (permissible substitution): Payer: PPO | Admitting: Internal Medicine

## 2018-06-12 ENCOUNTER — Encounter: Payer: Self-pay | Admitting: Adult Health

## 2018-06-12 ENCOUNTER — Ambulatory Visit: Payer: PPO | Admitting: Adult Health

## 2018-06-12 DIAGNOSIS — G4733 Obstructive sleep apnea (adult) (pediatric): Secondary | ICD-10-CM | POA: Diagnosis not present

## 2018-06-12 DIAGNOSIS — I2699 Other pulmonary embolism without acute cor pulmonale: Secondary | ICD-10-CM | POA: Diagnosis not present

## 2018-06-12 DIAGNOSIS — J45991 Cough variant asthma: Secondary | ICD-10-CM | POA: Diagnosis not present

## 2018-06-12 DIAGNOSIS — R918 Other nonspecific abnormal finding of lung field: Secondary | ICD-10-CM | POA: Diagnosis not present

## 2018-06-12 DIAGNOSIS — J189 Pneumonia, unspecified organism: Secondary | ICD-10-CM | POA: Diagnosis not present

## 2018-06-12 NOTE — Assessment & Plan Note (Signed)
Doing well on CPAP.  Patient is continue on CPAP at bedtime.  Work on healthy weight.  Do not drive a sleepy.

## 2018-06-12 NOTE — Assessment & Plan Note (Signed)
Recent admission May 2019 with multifocal pneumonia.  Patient is clinically improved after antibiotics.  Follow-up chest x-ray did not show any evidence of pna.
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
## 2018-06-12 NOTE — Assessment & Plan Note (Signed)
Stable on CT chest 04/2018  follow up CT chest 04/2019.

## 2018-06-12 NOTE — Assessment & Plan Note (Signed)
Continue on Xarelto.  Continue follow-up with hematology.

## 2018-06-12 NOTE — Patient Instructions (Signed)
Continue on current regimen  CT chest in 04/2019 to follow lung nodules  Continue on CPAP At bedtime   Follow up with Dr. Melvyn Novas  Or [place] in 6 months and As needed

## 2018-06-12 NOTE — Assessment & Plan Note (Signed)
Doing well on current regimen .  

## 2018-06-12 NOTE — Progress Notes (Signed)
Chart and office note reviewed in detail  > agree with a/p as outlined    

## 2018-06-16 MED FILL — ONE TOUCH ULTRA TEST STRIPS: 33 days supply | Qty: 100 | Fill #1

## 2018-06-23 ENCOUNTER — Other Ambulatory Visit: Payer: Self-pay | Admitting: Family Medicine

## 2018-06-23 DIAGNOSIS — R05 Cough: Secondary | ICD-10-CM

## 2018-06-23 DIAGNOSIS — R059 Cough, unspecified: Secondary | ICD-10-CM

## 2018-06-23 MED FILL — PRAVASTATIN SODIUM 10 MG TA: 10 | 90 days supply | Qty: 90 | Fill #3

## 2018-06-23 MED FILL — LEVOCETIRIZINE 5 MG TABLET: 5 | 90 days supply | Qty: 90 | Fill #0

## 2018-07-01 ENCOUNTER — Other Ambulatory Visit: Payer: Self-pay | Admitting: Family Medicine

## 2018-07-01 DIAGNOSIS — I10 Essential (primary) hypertension: Secondary | ICD-10-CM

## 2018-07-01 MED FILL — ALLOPURINOL 100 MG TABLET: 100 | 90 days supply | Qty: 90 | Fill #1

## 2018-07-01 MED FILL — XARELTO 10 MG TABLET: 10 | 30 days supply | Qty: 30 | Fill #7

## 2018-07-01 MED FILL — FUROSEMIDE 20 MG TABS: 20 | 90 days supply | Qty: 90 | Fill #0

## 2018-07-15 MED FILL — MEMANTINE HCL 10 MG TABS: 10 | 90 days supply | Qty: 180 | Fill #1

## 2018-07-15 MED FILL — POTASSIUM CL ER 20 MEQ TABL: 20 | 45 days supply | Qty: 90 | Fill #1

## 2018-07-28 MED FILL — XARELTO 10 MG TABLET: 10 | 30 days supply | Qty: 30 | Fill #8

## 2018-07-28 MED FILL — VERAPAMIL ER 240 MG TABLET: 240 | 90 days supply | Qty: 90 | Fill #1

## 2018-08-04 ENCOUNTER — Other Ambulatory Visit: Payer: Self-pay | Admitting: Family Medicine

## 2018-08-04 DIAGNOSIS — K219 Gastro-esophageal reflux disease without esophagitis: Secondary | ICD-10-CM

## 2018-08-04 MED FILL — metFORMIN HCL 1000 MG TABS: 1000 | 90 days supply | Qty: 180 | Fill #1

## 2018-08-04 MED FILL — PANTOPRAZOLE SOD DR 40 MG T: 40 | 90 days supply | Qty: 180 | Fill #0

## 2018-08-12 ENCOUNTER — Encounter: Payer: Self-pay | Admitting: Gastroenterology

## 2018-08-12 ENCOUNTER — Ambulatory Visit: Payer: PPO | Admitting: Gastroenterology

## 2018-08-12 VITALS — BP 134/80 | HR 72 | Ht 69.0 in | Wt 214.0 lb

## 2018-08-12 DIAGNOSIS — Z86010 Personal history of colonic polyps: Secondary | ICD-10-CM | POA: Diagnosis not present

## 2018-08-12 DIAGNOSIS — R197 Diarrhea, unspecified: Secondary | ICD-10-CM

## 2018-08-12 NOTE — Patient Instructions (Addendum)
Call if you have any new GI issues.  Normal BMI (Body Mass Index- based on height and weight) is between 23 and 30. Your BMI today is Body mass index is 31.6 kg/m. Marland Kitchen Please consider follow up  regarding your BMI with your Primary Care Provider.
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
## 2018-08-18 ENCOUNTER — Other Ambulatory Visit: Payer: Self-pay | Admitting: Family Medicine

## 2018-08-18 DIAGNOSIS — D500 Iron deficiency anemia secondary to blood loss (chronic): Secondary | ICD-10-CM

## 2018-08-18 DIAGNOSIS — I2699 Other pulmonary embolism without acute cor pulmonale: Secondary | ICD-10-CM

## 2018-08-21 MED FILL — CELECOXIB 200 MG CAP: 200 | 90 days supply | Qty: 180 | Fill #0

## 2018-08-25 MED FILL — XARELTO 10 MG TABLET: 10 | 30 days supply | Qty: 30 | Fill #9

## 2018-08-25 MED FILL — SYMBICORT 80-4.5 MCG INH: 80-4.5 | 30 days supply | Qty: 10 | Fill #2

## 2018-09-01 MED FILL — TOPIRAMATE 50 MG TABLET: 50 | 90 days supply | Qty: 180 | Fill #1

## 2018-09-01 MED FILL — POTASSIUM CL ER 20 MEQ TABL: 20 | 90 days supply | Qty: 180 | Fill #0

## 2018-09-02 ENCOUNTER — Other Ambulatory Visit: Payer: Self-pay | Admitting: Family Medicine

## 2018-09-02 DIAGNOSIS — I10 Essential (primary) hypertension: Secondary | ICD-10-CM

## 2018-09-02 MED FILL — METOPROLOL TARTRATE 25 MG T: 25 | 90 days supply | Qty: 90 | Fill #0

## 2018-09-18 ENCOUNTER — Other Ambulatory Visit: Payer: Self-pay | Admitting: Family Medicine

## 2018-09-18 DIAGNOSIS — E785 Hyperlipidemia, unspecified: Secondary | ICD-10-CM

## 2018-09-18 DIAGNOSIS — R059 Cough, unspecified: Secondary | ICD-10-CM

## 2018-09-18 DIAGNOSIS — R05 Cough: Secondary | ICD-10-CM

## 2018-09-18 MED FILL — LEVOCETIRIZINE 5 MG TABLET: 5 | 90 days supply | Qty: 90 | Fill #0

## 2018-09-18 MED FILL — XARELTO 10 MG TABLET: 10 | 30 days supply | Qty: 30 | Fill #10

## 2018-09-21 ENCOUNTER — Encounter: Payer: Self-pay | Admitting: Internal Medicine

## 2018-09-21 ENCOUNTER — Ambulatory Visit (INDEPENDENT_AMBULATORY_CARE_PROVIDER_SITE_OTHER): Payer: PPO | Admitting: Internal Medicine

## 2018-09-21 VITALS — BP 126/84 | HR 74 | Temp 97.8°F | Resp 16 | Ht 69.0 in | Wt 212.5 lb

## 2018-09-21 DIAGNOSIS — R399 Unspecified symptoms and signs involving the genitourinary system: Secondary | ICD-10-CM

## 2018-09-21 LAB — URINALYSIS, ROUTINE W REFLEX MICROSCOPIC
BILIRUBIN URINE: NEGATIVE
KETONES UR: NEGATIVE
Nitrite: POSITIVE — AB
Specific Gravity, Urine: 1.015 (ref 1.000–1.030)
Total Protein, Urine: 30 — AB
Urine Glucose: 100 — AB
Urobilinogen, UA: 2 — AB (ref 0.0–1.0)
pH: 5 (ref 5.0–8.0)
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
## 2018-09-21 NOTE — Progress Notes (Signed)
Pre visit review using our clinic review tool, if applicable. No additional management support is needed unless otherwise documented below in the visit note. 

## 2018-09-21 NOTE — Patient Instructions (Signed)
Please go to the lab and provide a urine sample  Call anytime if you have fever, chills or increased symptoms

## 2018-09-22 LAB — URINE CULTURE
MICRO NUMBER:: 91171265
RESULT: NO GROWTH
SPECIMEN QUALITY: ADEQUATE

## 2018-09-25 MED FILL — FUROSEMIDE 20 MG TABS: 20 | 90 days supply | Qty: 90 | Fill #1

## 2018-09-25 MED FILL — ALLOPURINOL 100 MG TABLET: 100 | 90 days supply | Qty: 90 | Fill #2

## 2018-09-25 NOTE — Addendum Note (Signed)
Addended byDamita Dunnings D on: 09/25/2018 02:16 PM   Modules accepted: Orders

## 2018-09-28 ENCOUNTER — Other Ambulatory Visit (INDEPENDENT_AMBULATORY_CARE_PROVIDER_SITE_OTHER): Payer: PPO

## 2018-09-28 ENCOUNTER — Other Ambulatory Visit (HOSPITAL_COMMUNITY)
Admission: RE | Admit: 2018-09-28 | Discharge: 2018-09-28 | Disposition: A | Discharge status: Home / Self Care | Payer: PPO | Source: Ambulatory Visit | Attending: Family Medicine | Admitting: Family Medicine

## 2018-09-28 DIAGNOSIS — R399 Unspecified symptoms and signs involving the genitourinary system: Secondary | ICD-10-CM

## 2018-09-29 LAB — URINE CULTURE
MICRO NUMBER:: 91202309
Result:: NO GROWTH
SPECIMEN QUALITY:: ADEQUATE

## 2018-09-29 LAB — URINALYSIS, ROUTINE W REFLEX MICROSCOPIC
Bilirubin Urine: NEGATIVE
HGB URINE DIPSTICK: NEGATIVE
Ketones, ur: NEGATIVE
LEUKOCYTES UA: NEGATIVE
Nitrite: NEGATIVE
RBC / HPF: NONE SEEN (ref 0–?)
SPECIFIC GRAVITY, URINE: 1.01 (ref 1.000–1.030)
Total Protein, Urine: NEGATIVE
URINE GLUCOSE: NEGATIVE
UROBILINOGEN UA: 0.2 (ref 0.0–1.0)
WBC UA: NONE SEEN (ref 0–?)
pH: 7.5 (ref 5.0–8.0)

## 2018-09-30 LAB — URINE CYTOLOGY ANCILLARY ONLY
Chlamydia: NEGATIVE
NEISSERIA GONORRHEA: NEGATIVE

## 2018-10-16 MED FILL — MEMANTINE HCL 10 MG TABLET: 10 | 90 days supply | Qty: 180 | Fill #2

## 2018-10-20 DIAGNOSIS — Z23 Encounter for immunization: Secondary | ICD-10-CM | POA: Diagnosis not present

## 2018-10-26 ENCOUNTER — Other Ambulatory Visit: Payer: Self-pay | Admitting: Family Medicine

## 2018-10-26 DIAGNOSIS — I10 Essential (primary) hypertension: Secondary | ICD-10-CM

## 2018-10-26 DIAGNOSIS — K219 Gastro-esophageal reflux disease without esophagitis: Secondary | ICD-10-CM

## 2018-10-26 MED FILL — PANTOPRAZOLE SOD DR 40 MG T: 40 | 90 days supply | Qty: 180 | Fill #0

## 2018-10-26 MED FILL — VERAPAMIL HCL ER 240 MG TBC: 240 | 90 days supply | Qty: 90 | Fill #0

## 2018-10-26 MED FILL — SYMBICORT 80-4.5 MCG INH: 80-4.5 | 30 days supply | Qty: 10 | Fill #3

## 2018-10-30 ENCOUNTER — Encounter: Payer: Self-pay | Admitting: Family Medicine

## 2018-10-30 ENCOUNTER — Ambulatory Visit (INDEPENDENT_AMBULATORY_CARE_PROVIDER_SITE_OTHER): Payer: PPO | Admitting: Family Medicine

## 2018-10-30 ENCOUNTER — Ambulatory Visit (HOSPITAL_BASED_OUTPATIENT_CLINIC_OR_DEPARTMENT_OTHER)
Admission: RE | Admit: 2018-10-30 | Discharge: 2018-10-30 | Disposition: A | Discharge status: Home / Self Care | Payer: PPO | Source: Ambulatory Visit | Attending: Family Medicine | Admitting: Family Medicine

## 2018-10-30 VITALS — BP 136/75 | HR 66 | Temp 98.0°F | Resp 16 | Ht 69.0 in | Wt 208.0 lb

## 2018-10-30 DIAGNOSIS — R059 Cough, unspecified: Secondary | ICD-10-CM

## 2018-10-30 DIAGNOSIS — J40 Bronchitis, not specified as acute or chronic: Secondary | ICD-10-CM | POA: Diagnosis not present

## 2018-10-30 DIAGNOSIS — R05 Cough: Secondary | ICD-10-CM | POA: Diagnosis not present

## 2018-10-30 DIAGNOSIS — K449 Diaphragmatic hernia without obstruction or gangrene: Secondary | ICD-10-CM | POA: Diagnosis not present

## 2018-10-30 MED ORDER — PREDNISONE 10 MG PO TABS: ORAL_TABLET | ORAL | 0 refills | Status: DC

## 2018-10-30 MED ORDER — METHYLPREDNISOLONE ACETATE 40 MG/ML IJ SUSP
40.0000 mg | Freq: Once | INTRAMUSCULAR | Status: AC
  Administered 2018-10-30: 40 mg via INTRAMUSCULAR

## 2018-10-30 MED ORDER — AZITHROMYCIN 250 MG PO TABS: ORAL_TABLET | ORAL | 0 refills | Status: DC

## 2018-10-30 MED ORDER — METFORMIN HCL 500 MG PO TABS: 500.0000 mg | ORAL_TABLET | Freq: Two times a day (BID) | ORAL | 1 refills | Status: DC

## 2018-10-30 MED ORDER — ALBUTEROL SULFATE (2.5 MG/3ML) 0.083% IN NEBU
2.5000 mg | INHALATION_SOLUTION | Freq: Once | RESPIRATORY_TRACT | Status: AC
  Administered 2018-10-30: 2.5 mg via RESPIRATORY_TRACT

## 2018-10-30 MED FILL — predniSONE 10 MG TABS: 10 | 12 days supply | Qty: 20 | Fill #0

## 2018-10-30 MED FILL — AZITHROMYCIN 250 MG TABLET: 250 | 5 days supply | Qty: 6 | Fill #0
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
## 2018-10-30 NOTE — Patient Instructions (Signed)
200-250   2 u  251-300 4 u  301-350  6 u  351-400 8 u  >400  10 u and call Dr  Please check your blood sugars 4x a day and give yourself insulin as needed       Acute Bronchitis, Adult Acute bronchitis is sudden (acute) swelling of the air tubes (bronchi) in the lungs. Acute bronchitis causes these tubes to fill with mucus, which can make it hard to breathe. It can also cause coughing or wheezing. In adults, acute bronchitis usually goes away within 2 weeks. A cough caused by bronchitis may last up to 3 weeks. Smoking, allergies, and asthma can make the condition worse. Repeated episodes of bronchitis may cause further lung problems, such as chronic obstructive pulmonary disease (COPD). What are the causes? This condition can be caused by germs and by substances that irritate the lungs, including:  Cold and flu viruses. This condition is most often caused by the same virus that causes a cold.  Bacteria.  Exposure to tobacco smoke, dust, fumes, and air pollution.  What increases the risk? This condition is more likely to develop in people who:  Have close contact with someone with acute bronchitis.  Are exposed to lung irritants, such as tobacco smoke, dust, fumes, and vapors.  Have a weak immune system.  Have a respiratory condition such as asthma.  What are the signs or symptoms? Symptoms of this condition include:  A cough.  Coughing up clear, yellow, or green mucus.  Wheezing.  Chest congestion.  Shortness of breath.  A fever.  Body aches.  Chills.  A sore throat.  How is this diagnosed? This condition is usually diagnosed with a physical exam. During the exam, your health care provider may order tests, such as chest X-rays, to rule out other conditions. He or she may also:  Test a sample of your mucus for bacterial infection.  Check the level of oxygen in your blood. This is done to check for pneumonia.  Do a chest X-ray or lung function testing to  rule out pneumonia and other conditions.  Perform blood tests.  Your health care provider will also ask about your symptoms and medical history. How is this treated? Most cases of acute bronchitis clear up over time without treatment. Your health care provider may recommend:  Drinking more fluids. Drinking more makes your mucus thinner, which may make it easier to breathe.  Taking a medicine for a fever or cough.  Taking an antibiotic medicine.  Using an inhaler to help improve shortness of breath and to control a cough.  Using a cool mist vaporizer or humidifier to make it easier to breathe.  Follow these instructions at home: Medicines  Take over-the-counter and prescription medicines only as told by your health care provider.  If you were prescribed an antibiotic, take it as told by your health care provider. Do not stop taking the antibiotic even if you start to feel better. General instructions  Get plenty of rest.  Drink enough fluids to keep your urine clear or pale yellow.  Avoid smoking and secondhand smoke. Exposure to cigarette smoke or irritating chemicals will make bronchitis worse. If you smoke and you need help quitting, ask your health care provider. Quitting smoking will help your lungs heal faster.  Use an inhaler, cool mist vaporizer, or humidifier as told by your health care provider.  Keep all follow-up visits as told by your health care provider. This is important. How is this  prevented? To lower your risk of getting this condition again:  Wash your hands often with soap and water. If soap and water are not available, use hand sanitizer.  Avoid contact with people who have cold symptoms.  Try not to touch your hands to your mouth, nose, or eyes.  Make sure to get the flu shot every year.  Contact a health care provider if:  Your symptoms do not improve in 2 weeks of treatment. Get help right away if:  You cough up blood.  You have chest  pain.  You have severe shortness of breath.  You become dehydrated.  You faint or keep feeling like you are going to faint.  You keep vomiting.  You have a severe headache.  Your fever or chills gets worse. This information is not intended to replace advice given to you by your health care provider. Make sure you discuss any questions you have with your health care provider. Document Released: 01/16/2005 Document Revised: 07/03/2016 Document Reviewed: 05/29/2016 Elsevier Interactive Patient Education  [employer].

## 2018-11-10 ENCOUNTER — Inpatient Hospital Stay: Payer: PPO

## 2018-11-10 ENCOUNTER — Inpatient Hospital Stay: Payer: PPO | Attending: Hematology & Oncology | Admitting: Hematology & Oncology

## 2018-11-10 VITALS — BP 129/83 | HR 73 | Temp 97.6°F | Resp 17

## 2018-11-10 DIAGNOSIS — Z79899 Other long term (current) drug therapy: Secondary | ICD-10-CM | POA: Diagnosis not present

## 2018-11-10 DIAGNOSIS — Z7984 Long term (current) use of oral hypoglycemic drugs: Secondary | ICD-10-CM | POA: Diagnosis not present

## 2018-11-10 DIAGNOSIS — Z7982 Long term (current) use of aspirin: Secondary | ICD-10-CM | POA: Diagnosis not present

## 2018-11-10 DIAGNOSIS — I2699 Other pulmonary embolism without acute cor pulmonale: Secondary | ICD-10-CM

## 2018-11-10 DIAGNOSIS — Z7901 Long term (current) use of anticoagulants: Secondary | ICD-10-CM | POA: Insufficient documentation

## 2018-11-10 DIAGNOSIS — D6862 Lupus anticoagulant syndrome: Secondary | ICD-10-CM | POA: Diagnosis not present

## 2018-11-10 DIAGNOSIS — M545 Low back pain: Secondary | ICD-10-CM

## 2018-11-10 DIAGNOSIS — Z86711 Personal history of pulmonary embolism: Secondary | ICD-10-CM

## 2018-11-10 LAB — CBC WITH DIFFERENTIAL (CANCER CENTER ONLY)
ABS IMMATURE GRANULOCYTES: 0.03 10*3/uL (ref 0.00–0.07)
Basophils Absolute: 0.1 10*3/uL (ref 0.0–0.1)
Basophils Relative: 1 %
Eosinophils Absolute: 0.2 10*3/uL (ref 0.0–0.5)
Eosinophils Relative: 2 %
HCT: 43.4 % (ref 39.0–52.0)
HEMOGLOBIN: 13.6 g/dL (ref 13.0–17.0)
IMMATURE GRANULOCYTES: 0 %
LYMPHS ABS: 1.9 10*3/uL (ref 0.7–4.0)
LYMPHS PCT: 19 %
MCH: 28.9 pg (ref 26.0–34.0)
MCHC: 31.3 g/dL (ref 30.0–36.0)
MCV: 92.1 fL (ref 80.0–100.0)
MONOS PCT: 7 %
Monocytes Absolute: 0.7 10*3/uL (ref 0.1–1.0)
NEUTROS ABS: 7.1 10*3/uL (ref 1.7–7.7)
NEUTROS PCT: 71 %
PLATELETS: 304 10*3/uL (ref 150–400)
RBC: 4.71 MIL/uL (ref 4.22–5.81)
RDW: 14.6 % (ref 11.5–15.5)
WBC Count: 10 10*3/uL (ref 4.0–10.5)
nRBC: 0 % (ref 0.0–0.2)

## 2018-11-10 LAB — CMP (CANCER CENTER ONLY)
ALBUMIN: 3.9 g/dL (ref 3.5–5.0)
ALT: 49 U/L — ABNORMAL HIGH (ref 0–44)
ANION GAP: 10 (ref 5–15)
AST: 27 U/L (ref 15–41)
Alkaline Phosphatase: 85 U/L (ref 38–126)
BUN: 17 mg/dL (ref 8–23)
CHLORIDE: 109 mmol/L (ref 98–111)
CO2: 23 mmol/L (ref 22–32)
Calcium: 9.4 mg/dL (ref 8.9–10.3)
Creatinine: 1.15 mg/dL (ref 0.61–1.24)
GFR, Est AFR Am: >60 mL/min (ref >60)
GFR, Estimated: >60 mL/min (ref >60)
GLUCOSE: 114 mg/dL — AB (ref 70–99)
POTASSIUM: 3.6 mmol/L (ref 3.5–5.1)
SODIUM: 142 mmol/L (ref 135–145)
TOTAL PROTEIN: 7.4 g/dL (ref 6.5–8.1)
Total Bilirubin: 0.6 mg/dL (ref 0.3–1.2)

## 2018-11-10 NOTE — Progress Notes (Signed)
Hematology and Oncology Follow Up Visit  Garrett [name] [id number] [birth year] 67 y.o. 11/10/2018   Principle Diagnosis:   Bilateral pulmonary emboli-no cor pulmonale  Lupus Anticoagulant (+)  Current Therapy:    Xarelto 20 mg by mouth daily-completed 9 months in 10/2017  Xarelto 10 mg po q day -  completed 1 yr in 11/2018.  EC ASA 325 mg po q day     Interim History:  Garrett [name] is back for follow-up.  Unfortunately, he is having some lower back issues.  He has had 4 back surgeries.  He is wearing his back brace today.  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  He will complete his 1 year of maintenance Xarelto tomorrow.  As such, we will then have him on full dose aspirin at 3 and 25 mg daily.  I think this will be adequate since he has the lupus anticoagulant.  He has had no problems with pain.  He has had pain in the back but otherwise there is no pain in the legs.  He has had no swelling in the legs.  There is no change in bowel or bladder habits.  He has had no cough.  There is been no chest wall discomfort.  He has had no hemoptysis.  His appetite has been quite good.  He is looking forward to a nice Thanksgiving with his family.     Overall, his performance status is ECOG 1.   Medications:  Current Outpatient Medications:  .  acidophilus (RISAQUAD) CAPS capsule, Take 1 capsule by mouth daily. , Disp: , Rfl:  .  albuterol (PROVENTIL) (5 MG/ML) 0.5% nebulizer solution, Take 2.5 mg by nebulization every 6 (six) hours as needed for wheezing or shortness of breath., Disp: , Rfl:  .  allopurinol (ZYLOPRIM) 100 MG tablet, TAKE 1 TABLET BY MOUTH DAILY., Disp: 90 tablet, Rfl: 3 .  aspirin EC 81 MG tablet, Take 81 mg by mouth daily., Disp: , Rfl:  .  budesonide-formoterol (SYMBICORT) 80-4.5 MCG/ACT inhaler, Inhale 2 puffs 2 (two) times daily into the lungs. (Patient taking differently: Inhale 2 puffs into the lungs daily. ), Disp: 1  Inhaler, Rfl: 11 .  celecoxib (CELEBREX) 200 MG capsule, TAKE 1 CAPSULE BY MOUTH TWICE DAILY, Disp: 180 capsule, Rfl: 0 .  cyanocobalamin 500 MCG tablet, Take 500 mcg by mouth daily.  , Disp: , Rfl:  .  EPINEPHrine (EPIPEN) 0.3 mg/0.3 mL SOAJ injection, Inject 0.3 mg into the muscle once., Disp: , Rfl:  .  furosemide (LASIX) 20 MG tablet, TAKE 1 TABLET BY MOUTH DAILY., Disp: 90 tablet, Rfl: 3 .  gabapentin (NEURONTIN) 300 MG capsule, Take 1 capsule (300 mg total) by mouth at bedtime. NEED APPOINTMENT FOR REFILLS, Disp: 90 capsule, Rfl: 0 .  levocetirizine (XYZAL) 5 MG tablet, Take 1 tablet (5 mg total) by mouth every evening., Disp: 90 tablet, Rfl: 1 .  magnesium oxide (MAG-OX) 400 (241.3 Mg) MG tablet, Take 1 tablet (400 mg total) by mouth daily., Disp: 30 tablet, Rfl: 1 .  meclizine (ANTIVERT) 25 MG tablet, Take 1 tablet (25 mg total) by mouth 3 (three) times daily as needed for dizziness., Disp: 30 tablet, Rfl: 0 .  memantine (NAMENDA) 10 MG tablet, Take 2 tablets (20 mg total) by mouth daily., Disp: 180 tablet, Rfl: 3 .  metFORMIN (GLUCOPHAGE) 500 MG tablet, Take 1 tablet (500 mg total) by mouth 2 (two) times daily with a meal., Disp:  180 tablet, Rfl: 1 .  metoprolol tartrate (LOPRESSOR) 25 MG tablet, Take 1 tablet (25 mg total) by mouth daily. NEEDS FOLLOW UP APPT, Disp: 90 tablet, Rfl: 0 .  mometasone (NASONEX) 50 MCG/ACT nasal spray, Place 2 sprays into the nose daily. (Patient taking differently: Place 2 sprays into the nose daily as needed (congestion). ), Disp: 51 g, Rfl: 2 .  Multiple Vitamins-Minerals (ONE-A-DAY WEIGHT SMART ADVANCE PO), Take 1 tablet by mouth daily. , Disp: , Rfl:  .  ONE TOUCH ULTRA TEST test strip, USE AS DIRECTED THREE TIMES DAILY, Disp: 100 each, Rfl: 11 .  pantoprazole (PROTONIX) 40 MG tablet, TAKE 1 TABLET BY MOUTH TWICE DAILY, Disp: 180 tablet, Rfl: 0 .  potassium chloride SA (K-DUR,KLOR-CON) 20 MEQ tablet, Take 2 tablets (40 mEq total) by mouth daily. (Patient  taking differently: Take 20 mEq by mouth 2 (two) times daily. ), Disp: 180 tablet, Rfl: 1 .  pravastatin (PRAVACHOL) 10 MG tablet, Take 1 tablet (10 mg total) by mouth daily., Disp: 90 tablet, Rfl: 1 .  PROAIR HFA 108 (90 Base) MCG/ACT inhaler, Inhale 1 puff into the lungs every 6 (six) hours as needed for wheezing or shortness of breath. , Disp: , Rfl:  .  rivaroxaban (XARELTO) 10 MG TABS tablet, Take 1 tablet (10 mg total) by mouth daily., Disp: 90 tablet, Rfl: 3 .  SUMAtriptan (IMITREX) 50 MG tablet, Take 1 tablet as needed for migraine/vertigo. Do not take more than 3 a week, Disp: 10 tablet, Rfl: 6 .  topiramate (TOPAMAX) 50 MG tablet, Take 1 tablet (50 mg total) by mouth 2 (two) times daily., Disp: 180 tablet, Rfl: 3 .  verapamil (CALAN-SR) 240 MG CR tablet, TAKE 1 TABLET BY MOUTH AT BEDTIME., Disp: 90 tablet, Rfl: 0  Allergies:  Allergies  Allergen Reactions  . Bee Venom Anaphylaxis  . Garlic Swelling    Past Medical History, Surgical history, Social history, and Family History were reviewed and updated.  Review of Systems: Review of Systems  Constitutional: Negative.   HENT: Negative.   Eyes: Negative.   Respiratory: Negative.   Cardiovascular: Negative.   Gastrointestinal: Negative.   Genitourinary: Negative.   Musculoskeletal: Negative.   Skin: Negative.   Neurological: Negative.   Endo/Heme/Allergies: Negative.   Psychiatric/Behavioral: Negative.      Physical Exam:  oral temperature is 97.6 F (36.4 C). His blood pressure is 129/83 and his pulse is 73. His respiration is 17 and oxygen saturation is 100%.   Wt Readings from Last 3 Encounters:  10/30/18 208 lb (94.3 kg)  09/21/18 212 lb 8 oz (96.4 kg)  08/12/18 214 lb (97.1 kg)     Physical Exam  Constitutional: He is oriented to person, place, and time.  HENT:  Head: Normocephalic and atraumatic.  Mouth/Throat: Oropharynx is clear and moist.  Eyes: Pupils are equal, round, and reactive to light. EOM are  normal.  Neck: Normal range of motion.  Cardiovascular: Normal rate, regular rhythm and normal heart sounds.  Pulmonary/Chest: Effort normal and breath sounds normal.  Abdominal: Soft. Bowel sounds are normal.  Musculoskeletal: Normal range of motion. He exhibits no edema, tenderness or deformity.  Lymphadenopathy:    He has no cervical adenopathy.  Neurological: He is alert and oriented to person, place, and time.  Skin: Skin is warm and dry. No rash noted. No erythema.  Psychiatric: He has a normal mood and affect. His behavior is normal. Judgment and thought content normal.  Vitals reviewed.  Lab Results  Component Value Date   WBC 10.0 11/10/2018   HGB 13.6 11/10/2018   HCT 43.4 11/10/2018   MCV 92.1 11/10/2018   PLT 304 11/10/2018     Chemistry      Component Value Date/Time   NA 144 05/19/2018 0907   NA 146 (H) 12/04/2017 1418   K 3.8 05/19/2018 0907   K 3.4 12/04/2017 1418   CL 110 (H) 05/19/2018 0907   CL 105 12/04/2017 1418   CO2 22 05/19/2018 0907   CO2 24 12/04/2017 1418   BUN 19 05/19/2018 0907   BUN 11 12/04/2017 1418   CREATININE 1.20 05/19/2018 0907   CREATININE 1.10 04/28/2018 1016      Component Value Date/Time   CALCIUM 9.1 05/19/2018 0907   CALCIUM 8.9 12/04/2017 1418   ALKPHOS 85 (H) 05/19/2018 0907   ALKPHOS 103 (H) 12/04/2017 1418   AST 26 05/19/2018 0907   ALT 53 (H) 05/19/2018 0907   ALT 51 (H) 12/04/2017 1418   BILITOT 0.6 05/19/2018 0907         Impression and Plan: Garrett [name] is a 67 year old gentleman with bilateral pulmonary emboli.  He is positive for the lupus anticoagulant.  At this point, we can let him go from our practice.  I do not think that we are adding to his health care at this time.  If he develops any problems down the road, we will be more than happy to see him back.  [name], MD 11/19/20199:43 AM

## 2018-11-12 LAB — LUPUS ANTICOAGULANT PANEL
DRVVT: 69 s — ABNORMAL HIGH (ref 0.0–47.0)
PTT LA: 36.7 s (ref 0.0–51.9)

## 2018-11-12 LAB — DRVVT CONFIRM: dRVVT Confirm: 1.5 ratio — ABNORMAL HIGH (ref 0.8–1.2)

## 2018-11-12 LAB — DRVVT MIX: dRVVT Mix: 53.3 s — ABNORMAL HIGH (ref 0.0–47.0)

## 2018-11-13 ENCOUNTER — Other Ambulatory Visit: Payer: Self-pay | Admitting: Family Medicine

## 2018-11-13 MED ORDER — POTASSIUM CHLORIDE CRYS ER 20 MEQ PO TBCR: 40.0000 meq | EXTENDED_RELEASE_TABLET | Freq: Every day | ORAL | 1 refills | Status: DC

## 2018-11-13 NOTE — Telephone Encounter (Signed)
Copied from [place] [id number]. Topic: Quick Communication - Rx Refill/Question >> Nov 13, 2018  7:35 AM Conception Chancy, NT wrote: Medication: potassium chloride SA (K-DUR,KLOR-CON) 20 MEQ tablet [  Has the patient contacted their pharmacy? Yes.  Changing pharmacies. (Agent: If no, request that the patient contact the pharmacy for the refill.)  (Agent: If yes, when and what did the pharmacy advise?)  Preferred Pharmacy (with phone number or street name): [id number] - [place], [place] - [address] [place] [address] Phone: [phone] Fax: [phone]    Agent: Please be advised that RX refills may take up to 3 business days. We ask that you follow-up with your pharmacy.

## 2018-11-13 NOTE — Telephone Encounter (Signed)
Requested Prescriptions  Pending Prescriptions Disp Refills  . potassium chloride SA (K-DUR,KLOR-CON) 20 MEQ tablet 180 tablet 1    Sig: Take 2 tablets (40 mEq total) by mouth daily.     Endocrinology:  Minerals - Potassium Supplementation Passed - 11/13/2018  7:38 AM      Passed - K in normal range and within 360 days    Potassium  Date Value Ref Range Status  11/10/2018 3.6 3.5 - 5.1 mmol/L Final  12/04/2017 3.4 3.3 - 4.7 mEq/L Final         Passed - Cr in normal range and within 360 days    Creatinine  Date Value Ref Range Status  11/10/2018 1.15 0.61 - 1.24 mg/dL Final   Creat  Date Value Ref Range Status  04/28/2018 1.10 0.70 - 1.25 mg/dL Final    Comment:    For patients >61 years of age, the reference limit for Creatinine is approximately 13% higher for people identified as African-American. Renella Cunas - Valid encounter within last 12 months    Recent Outpatient Visits          2 weeks ago Cough   [facility], DO   1 month ago Lower urinary tract symptoms (LUTS)   [facility], MD   6 months ago Pneumonia due to infectious organism, unspecified laterality, unspecified part of lung   [facility], PA-C   6 months ago Chest pain, unspecified type   [place], Marvis Repress, FNP   9 months ago Preventative health care   [facility] [place], DO      Future Appointments            In 2 months Parrett, Fonnie Mu, NP [facility]

## 2018-11-16 ENCOUNTER — Other Ambulatory Visit: Payer: Self-pay | Admitting: Family Medicine

## 2018-11-16 DIAGNOSIS — D500 Iron deficiency anemia secondary to blood loss (chronic): Secondary | ICD-10-CM

## 2018-11-16 DIAGNOSIS — I2699 Other pulmonary embolism without acute cor pulmonale: Secondary | ICD-10-CM

## 2018-11-16 NOTE — Telephone Encounter (Signed)
Copied from [place] [id number]. Topic: Quick Communication - Rx Refill/Question >> Nov 16, 2018 11:29 AM Judyann Munson wrote: Medication: celecoxib (CELEBREX) 200 MG capsule  Has the patient contacted their pharmacy? No   Preferred Pharmacy (with phone number or street name): [id number] - [place], [place] - [address] [phone] (Phone) [phone] (Fax)    Agent: Please be advised that RX refills may take up to 3 business days. We ask that you follow-up with your pharmacy.

## 2018-11-17 MED ORDER — CELECOXIB 200 MG PO CAPS: 200.0000 mg | ORAL_CAPSULE | Freq: Two times a day (BID) | ORAL | 0 refills | Status: DC

## 2018-11-23 ENCOUNTER — Encounter: Payer: Self-pay | Admitting: Neurology

## 2018-11-23 ENCOUNTER — Ambulatory Visit: Payer: PPO | Admitting: Neurology

## 2018-11-23 VITALS — BP 132/70 | HR 60 | Ht 69.0 in | Wt 210.0 lb

## 2018-11-23 DIAGNOSIS — G629 Polyneuropathy, unspecified: Secondary | ICD-10-CM | POA: Diagnosis not present

## 2018-11-23 DIAGNOSIS — G43109 Migraine with aura, not intractable, without status migrainosus: Secondary | ICD-10-CM | POA: Diagnosis not present

## 2018-11-23 MED ORDER — SUMATRIPTAN SUCCINATE 50 MG PO TABS: ORAL_TABLET | ORAL | 6 refills | Status: DC

## 2018-11-23 MED ORDER — TOPIRAMATE 50 MG PO TABS: 50.0000 mg | ORAL_TABLET | Freq: Two times a day (BID) | ORAL | 3 refills | Status: DC
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
## 2018-11-23 NOTE — Patient Instructions (Signed)
1. Refer to [facility]hab for Balance Therapy for neuropathy 2. Continue Topamax 50mg  twice a day 3. Follow-up in 6 months or so, call for any changes

## 2018-12-02 ENCOUNTER — Encounter: Payer: Self-pay | Admitting: Physical Therapy

## 2018-12-02 ENCOUNTER — Ambulatory Visit: Payer: PPO | Attending: Neurology | Admitting: Physical Therapy

## 2018-12-02 ENCOUNTER — Other Ambulatory Visit: Payer: Self-pay

## 2018-12-02 DIAGNOSIS — R2681 Unsteadiness on feet: Secondary | ICD-10-CM

## 2018-12-02 DIAGNOSIS — R42 Dizziness and giddiness: Secondary | ICD-10-CM

## 2018-12-02 DIAGNOSIS — R2689 Other abnormalities of gait and mobility: Secondary | ICD-10-CM | POA: Diagnosis not present
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
## 2018-12-03 ENCOUNTER — Other Ambulatory Visit: Payer: Self-pay | Admitting: Family Medicine

## 2018-12-03 DIAGNOSIS — I10 Essential (primary) hypertension: Secondary | ICD-10-CM

## 2018-12-03 MED FILL — ONE TOUCH ULTRA TEST STRIPS: 33 days supply | Qty: 100 | Fill #2

## 2018-12-04 MED ORDER — METOPROLOL TARTRATE 25 MG PO TABS: 25.0000 mg | ORAL_TABLET | Freq: Every day | ORAL | 2 refills | Status: DC

## 2018-12-04 MED FILL — FUROSEMIDE 20 MG TABS: 20 | 90 days supply | Qty: 90 | Fill #2

## 2018-12-11 ENCOUNTER — Ambulatory Visit: Payer: PPO

## 2018-12-11 DIAGNOSIS — R42 Dizziness and giddiness: Secondary | ICD-10-CM

## 2018-12-11 DIAGNOSIS — R2689 Other abnormalities of gait and mobility: Secondary | ICD-10-CM

## 2018-12-11 DIAGNOSIS — R2681 Unsteadiness on feet: Secondary | ICD-10-CM

## 2018-12-11 NOTE — Patient Instructions (Signed)
Access Code: [email or web address]  URL: [email or web address]  Date: 12/11/2018  Prepared by: Geoffry Paradise   Exercises  Standing with Head Rotation - 3 reps - 1 sets - 1x daily - 5x weekly  Standing with Head Nod - 3 reps - 1 sets - 1x daily - 5x weekly  Standing Balance with Eyes Closed - 3 reps - 1 sets - 10 hold - 1x daily - 5x weekly  Backwards Walking - 4 reps - 1 sets - 1x daily - 5x weekly  Side Stepping with Counter Support - 4 reps - 1 sets - 1x daily - 5x weekly  Standing March with Counter Support - 4 reps - 1 sets - 1x daily - 5x weekly
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
## 2018-12-15 ENCOUNTER — Ambulatory Visit: Payer: PPO | Admitting: Physical Therapy

## 2018-12-15 ENCOUNTER — Encounter: Payer: Self-pay | Admitting: Physical Therapy

## 2018-12-15 DIAGNOSIS — R2681 Unsteadiness on feet: Secondary | ICD-10-CM

## 2018-12-15 DIAGNOSIS — R42 Dizziness and giddiness: Secondary | ICD-10-CM | POA: Diagnosis not present

## 2018-12-15 DIAGNOSIS — R2689 Other abnormalities of gait and mobility: Secondary | ICD-10-CM
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
## 2018-12-15 NOTE — Patient Instructions (Signed)
Access Code: [email or web address]  URL: [email or web address]  Date: 12/15/2018  Prepared by: Misty Stanley   Exercises  Standing with Head Rotation - 3 reps - 1 sets - 1x daily - 5x weekly  Standing with Head Nod - 3 reps - 1 sets - 1x daily - 5x weekly  Standing Balance with Eyes Closed - 3 reps - 1 sets - 10 hold - 1x daily - 5x weekly  Backwards Walking - 4 reps - 1 sets - 1x daily - 5x weekly  Side Stepping with Counter Support - 4 reps - 1 sets - 1x daily - 5x weekly  Standing March with Counter Support - 4 reps - 1 sets - 1x daily - 5x weekly  Seated Gaze Stabilization with Head Nod - 60 seconds hold - 3x daily - 7x weekly  Seated Gaze Stabilization with Head Rotation - 60 seconds hold - 3x daily - 7x weekly

## 2018-12-18 ENCOUNTER — Ambulatory Visit: Payer: PPO

## 2018-12-18 DIAGNOSIS — R2681 Unsteadiness on feet: Secondary | ICD-10-CM

## 2018-12-18 DIAGNOSIS — R2689 Other abnormalities of gait and mobility: Secondary | ICD-10-CM

## 2018-12-18 DIAGNOSIS — R42 Dizziness and giddiness: Secondary | ICD-10-CM

## 2018-12-18 NOTE — Patient Instructions (Signed)
Access Code: [email or web address]  URL: [email or web address]  Date: 12/18/2018  Prepared by: Geoffry Paradise   Exercises  Standing with Head Rotation - 3 reps - 1 sets - 1x daily - 5x weekly  Standing with Head Nod - 3 reps - 1 sets - 1x daily - 5x weekly  Standing Balance with Eyes Closed - 3 reps - 1 sets - 10 hold - 1x daily - 5x weekly  Backwards Walking - 4 reps - 1 sets - 1x daily - 5x weekly  Side Stepping with Counter Support - 4 reps - 1 sets - 1x daily - 5x weekly  Standing March with Counter Support - 4 reps - 1 sets - 1x daily - 5x weekly  Seated Gaze Stabilization with Head Nod - 60 seconds hold - 3x daily - 7x weekly  Seated Gaze Stabilization with Head Rotation - 60 seconds hold - 3x daily - 7x weekly  Clamshell - 10 reps - 2 sets - 1x daily - 3x weekly  Supine Bridge - 10 reps - 2 sets - 1x daily - 3x weekly  Supine Figure 4 Piriformis Stretch - 2-3 reps - 1 sets - 30-60 hold - 1x daily - 7x weekly
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
## 2018-12-21 ENCOUNTER — Encounter: Payer: Self-pay | Admitting: Family Medicine

## 2018-12-21 DIAGNOSIS — H26493 Other secondary cataract, bilateral: Secondary | ICD-10-CM | POA: Diagnosis not present

## 2018-12-21 DIAGNOSIS — E119 Type 2 diabetes mellitus without complications: Secondary | ICD-10-CM | POA: Diagnosis not present

## 2018-12-21 DIAGNOSIS — H5022 Vertical strabismus, left eye: Secondary | ICD-10-CM | POA: Diagnosis not present

## 2018-12-21 DIAGNOSIS — H40013 Open angle with borderline findings, low risk, bilateral: Secondary | ICD-10-CM | POA: Diagnosis not present

## 2018-12-21 LAB — HM DIABETES EYE EXAM

## 2018-12-21 LAB — HM MAMMOGRAPHY

## 2018-12-24 ENCOUNTER — Ambulatory Visit: Payer: Medicare Other

## 2018-12-24 ENCOUNTER — Telehealth: Payer: Self-pay | Admitting: *Deleted

## 2018-12-24 NOTE — Telephone Encounter (Signed)
Received Diabetic Eye Exam Report from [facility]; forwarded to provider/SLS 01/02

## 2018-12-25 ENCOUNTER — Ambulatory Visit: Payer: Medicare Other | Attending: Neurology | Admitting: Physical Therapy

## 2018-12-25 ENCOUNTER — Encounter: Payer: Self-pay | Admitting: Family Medicine

## 2018-12-25 ENCOUNTER — Encounter: Payer: Self-pay | Admitting: Physical Therapy

## 2018-12-25 DIAGNOSIS — I2699 Other pulmonary embolism without acute cor pulmonale: Secondary | ICD-10-CM

## 2018-12-25 DIAGNOSIS — R2689 Other abnormalities of gait and mobility: Secondary | ICD-10-CM | POA: Diagnosis not present

## 2018-12-25 DIAGNOSIS — R2681 Unsteadiness on feet: Secondary | ICD-10-CM | POA: Diagnosis not present

## 2018-12-25 DIAGNOSIS — R42 Dizziness and giddiness: Secondary | ICD-10-CM | POA: Diagnosis not present

## 2018-12-25 DIAGNOSIS — M6281 Muscle weakness (generalized): Secondary | ICD-10-CM | POA: Insufficient documentation

## 2018-12-25 DIAGNOSIS — D500 Iron deficiency anemia secondary to blood loss (chronic): Secondary | ICD-10-CM

## 2018-12-25 MED FILL — ALLOPURINOL 100 MG TABS: 100 | 90 days supply | Qty: 90 | Fill #3
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
## 2018-12-25 NOTE — Patient Instructions (Addendum)
Access Code: [email or web address]  URL: [email or web address]  Date: 12/25/2018  Prepared by: Misty Stanley   Exercises  Backwards Walking - 4 reps - 1 sets - 1x daily - 5x weekly  Side Stepping with Counter Support - 4 reps - 1 sets - 1x daily - 5x weekly  Standing March with Counter Support - 4 reps - 1 sets - 1x daily - 5x weekly  Clamshell - 10 reps - 2 sets - 1x daily - 3x weekly  Supine Bridge - 10 reps - 2 sets - 1x daily - 3x weekly  Standing Gaze Stabilization with Head Rotation - 2 sets - 60 seconds hold - 2x daily - 7x weekly  Standing Gaze Stabilization with Head Nod - 2 sets - 60 seconds hold - 2x daily - 7x weekly  Romberg Stance with Head Nods - 10 reps - 1 sets - 2x daily - 7x weekly  Romberg Stance with Head Rotation - 10 reps - 1 sets - 2x daily - 7x weekly  Romberg Stance with Eyes Closed - 1 sets - 10 seconds hold - 2x daily - 7x weekly  Supine Figure 4 Piriformis Stretch - 2-3 reps - 1 sets - 30-60 hold - 1x daily - 7x weekly

## 2018-12-28 ENCOUNTER — Encounter: Payer: Self-pay | Admitting: Physical Therapy

## 2018-12-28 ENCOUNTER — Ambulatory Visit: Payer: Medicare Other | Admitting: Physical Therapy

## 2018-12-28 DIAGNOSIS — R42 Dizziness and giddiness: Secondary | ICD-10-CM | POA: Diagnosis not present

## 2018-12-28 DIAGNOSIS — R2681 Unsteadiness on feet: Secondary | ICD-10-CM | POA: Diagnosis not present

## 2018-12-28 DIAGNOSIS — M6281 Muscle weakness (generalized): Secondary | ICD-10-CM | POA: Diagnosis not present

## 2018-12-28 DIAGNOSIS — R2689 Other abnormalities of gait and mobility: Secondary | ICD-10-CM

## 2018-12-28 MED ORDER — MOMETASONE FUROATE 50 MCG/ACT NA SUSP: 2.0000 | Freq: Every day | NASAL | 3 refills | Status: DC

## 2018-12-28 MED ORDER — BUDESONIDE-FORMOTEROL FUMARATE 80-4.5 MCG/ACT IN AERO: 2.0000 | INHALATION_SPRAY | Freq: Two times a day (BID) | RESPIRATORY_TRACT | 3 refills | Status: DC

## 2018-12-28 NOTE — Addendum Note (Signed)
Addended by: Kem Boroughs D on: 12/28/2018 09:15 AM   Modules accepted: Orders
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
## 2018-12-31 ENCOUNTER — Encounter: Payer: Self-pay | Admitting: Physical Therapy

## 2018-12-31 ENCOUNTER — Ambulatory Visit: Payer: Medicare Other | Admitting: Physical Therapy

## 2018-12-31 DIAGNOSIS — R2681 Unsteadiness on feet: Secondary | ICD-10-CM | POA: Diagnosis not present

## 2018-12-31 DIAGNOSIS — R42 Dizziness and giddiness: Secondary | ICD-10-CM

## 2018-12-31 DIAGNOSIS — M6281 Muscle weakness (generalized): Secondary | ICD-10-CM | POA: Diagnosis not present

## 2018-12-31 DIAGNOSIS — R2689 Other abnormalities of gait and mobility: Secondary | ICD-10-CM

## 2018-12-31 NOTE — Patient Instructions (Signed)
Plantar Flexion: Resisted    In sitting: Place Blue band around ball of your foot.  Let toes come up towards your head slowly and push down into band.   Repeat __12-15__ times per side. Do __2__ sets per session. Do __2__ sessions per day. Ankle does NOT have to rest on a ball.
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
## 2019-01-04 ENCOUNTER — Ambulatory Visit: Payer: Medicare Other | Admitting: Physical Therapy

## 2019-01-05 ENCOUNTER — Ambulatory Visit: Payer: Self-pay | Admitting: *Deleted

## 2019-01-05 NOTE — Telephone Encounter (Signed)
Pt called stating he is having some shortness of breath that started Sunday after church. He stated that he has a cough and it is productive. He stated that he is wheezing per his wife.. he thinks the weather is causing this.  He has a hx of a respiratory ailment but does not know what it is called. He denies having asthma or emphysema. He using an inhaler and nebulizer. He denies having a fever. Has a runny nose. His congestion feels like heaviness in his chest. He is requesting an appointment with his provider. Appointment scheduled per protocol. Home care advice given to patient with verbal understanding. He will go to the ED if he has increase in shortness of breath. Pt voiced understanding. Routing to flow at [facility] at [facility].  Reason for Disposition . [1] MODERATE longstanding difficulty breathing (e.g., speaks in phrases, SOB even at rest, pulse 100-120) AND [2] SAME as normal  Answer Assessment - Initial Assessment Questions 1. RESPIRATORY STATUS: "Describe your breathing?" (e.g., wheezing, shortness of breath, unable to speak, severe coughing)      Shortness of breath 2. ONSET: "When did this breathing problem begin?"      Sunday 3. PATTERN "Does the difficult breathing come and go, or has it been constant since it started?"      Heaviness is there constant 4. SEVERITY: "How bad is your breathing?" (e.g., mild, moderate, severe)    - MILD: No SOB at rest, mild SOB with walking, speaks normally in sentences, can lay down, no retractions, pulse < 100.    - MODERATE: SOB at rest, SOB with minimal exertion and prefers to sit, cannot lie down flat, speaks in phrases, mild retractions, audible wheezing, pulse 100-120.    - SEVERE: Very SOB at rest, speaks in single words, struggling to breathe, sitting hunched forward, retractions, pulse > 120      moderate 5. RECURRENT SYMPTOM: "Have you had difficulty breathing before?" If so, ask: "When was the last time?" and "What  happened that time?"      Yes, started on inhalers and seen a pulmonologist 6. CARDIAC HISTORY: "Do you have any history of heart disease?" (e.g., heart attack, angina, bypass surgery, angioplasty)      HTN 7. LUNG HISTORY: "Do you have any history of lung disease?"  (e.g., pulmonary embolus, asthma, emphysema)     Not sure 8. CAUSE: "What do you think is causing the breathing problem?"      Possible weather 9. OTHER SYMPTOMS: "Do you have any other symptoms? (e.g., dizziness, runny nose, cough, chest pain, fever)     Chest heaviness, runny nose, cough 10. PREGNANCY: "Is there any chance you are pregnant?" "When was your last menstrual period?"       n/a 11. TRAVEL: "Have you traveled out of the country in the last month?" (e.g., travel history, exposures)       no  Protocols used: BREATHING DIFFICULTY-A-AH

## 2019-01-06 ENCOUNTER — Encounter: Payer: Self-pay | Admitting: Family

## 2019-01-06 ENCOUNTER — Ambulatory Visit (INDEPENDENT_AMBULATORY_CARE_PROVIDER_SITE_OTHER): Payer: Medicare Other | Admitting: Family

## 2019-01-06 ENCOUNTER — Ambulatory Visit (HOSPITAL_BASED_OUTPATIENT_CLINIC_OR_DEPARTMENT_OTHER)
Admission: RE | Admit: 2019-01-06 | Discharge: 2019-01-06 | Disposition: A | Discharge status: Home / Self Care | Payer: Medicare Other | Source: Ambulatory Visit | Attending: Family | Admitting: Family

## 2019-01-06 VITALS — BP 120/83 | HR 67 | Temp 98.0°F | Resp 16 | Ht 69.0 in | Wt 209.0 lb

## 2019-01-06 DIAGNOSIS — R05 Cough: Secondary | ICD-10-CM | POA: Diagnosis not present

## 2019-01-06 DIAGNOSIS — R0602 Shortness of breath: Secondary | ICD-10-CM

## 2019-01-06 DIAGNOSIS — E1121 Type 2 diabetes mellitus with diabetic nephropathy: Secondary | ICD-10-CM | POA: Diagnosis not present

## 2019-01-06 DIAGNOSIS — R059 Cough, unspecified: Secondary | ICD-10-CM

## 2019-01-06 LAB — CBC WITH DIFFERENTIAL/PLATELET
Basophils Absolute: 0.1 10*3/uL (ref 0.0–0.1)
Basophils Relative: 0.6 % (ref 0.0–3.0)
Eosinophils Absolute: 0.9 10*3/uL — ABNORMAL HIGH (ref 0.0–0.7)
Eosinophils Relative: 10.2 % — ABNORMAL HIGH (ref 0.0–5.0)
HCT: 41.6 % (ref 39.0–52.0)
Hemoglobin: 13.7 g/dL (ref 13.0–17.0)
Lymphocytes Relative: 19.6 % (ref 12.0–46.0)
Lymphs Abs: 1.7 10*3/uL (ref 0.7–4.0)
MCHC: 33 g/dL (ref 30.0–36.0)
MCV: 90.9 fl (ref 78.0–100.0)
Monocytes Absolute: 0.5 10*3/uL (ref 0.1–1.0)
Monocytes Relative: 6.4 % (ref 3.0–12.0)
Neutro Abs: 5.4 10*3/uL (ref 1.4–7.7)
Neutrophils Relative %: 63.2 % (ref 43.0–77.0)
Platelets: 237 10*3/uL (ref 150.0–400.0)
RBC: 4.58 Mil/uL (ref 4.22–5.81)
RDW: 15.4 % (ref 11.5–15.5)
WBC: 8.6 10*3/uL (ref 4.0–10.5)

## 2019-01-06 LAB — BASIC METABOLIC PANEL
BUN: 19 mg/dL (ref 6–23)
CO2: 27 mEq/L (ref 19–32)
CREATININE: 1.13 mg/dL (ref 0.40–1.50)
Calcium: 9.8 mg/dL (ref 8.4–10.5)
Chloride: 106 mEq/L (ref 96–112)
GFR: 68.44 mL/min (ref >60.00)
Glucose, Bld: 104 mg/dL — ABNORMAL HIGH (ref 70–99)
Potassium: 4.2 mEq/L (ref 3.5–5.1)
Sodium: 141 mEq/L (ref 135–145)

## 2019-01-06 LAB — HEMOGLOBIN A1C: Hgb A1c MFr Bld: 5.9 % (ref 4.6–6.5)

## 2019-01-06 LAB — BRAIN NATRIURETIC PEPTIDE: Pro B Natriuretic peptide (BNP): 26 pg/mL (ref 0.0–100.0)

## 2019-01-06 LAB — D-DIMER, QUANTITATIVE (NOT AT ARMC): D DIMER QUANT: 2.61 ug{FEU}/mL — AB (ref <0.50)

## 2019-01-06 NOTE — Patient Instructions (Signed)
Please complete lab work prior to leaving. Complete chest x-ray on the first floor. Call if symptoms fail to improve in the next 1 week. Go to the ER If you develop worsening shortness of breath or recurrent chest pain.
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
## 2019-01-07 ENCOUNTER — Other Ambulatory Visit: Payer: Self-pay

## 2019-01-07 ENCOUNTER — Ambulatory Visit: Payer: Medicare Other | Admitting: Physical Therapy

## 2019-01-07 ENCOUNTER — Telehealth: Payer: Self-pay | Admitting: Family

## 2019-01-07 ENCOUNTER — Observation Stay (HOSPITAL_BASED_OUTPATIENT_CLINIC_OR_DEPARTMENT_OTHER)
Admission: EM | Admit: 2019-01-07 | Discharge: 2019-01-08 | Disposition: A | Discharge status: Home / Self Care | Payer: Medicare Other | Attending: Internal Medicine | Admitting: Internal Medicine

## 2019-01-07 ENCOUNTER — Ambulatory Visit (HOSPITAL_BASED_OUTPATIENT_CLINIC_OR_DEPARTMENT_OTHER)
Admission: RE | Admit: 2019-01-07 | Discharge: 2019-01-07 | Disposition: A | Discharge status: Home / Self Care | Payer: Medicare Other | Source: Ambulatory Visit | Attending: Family | Admitting: Family

## 2019-01-07 ENCOUNTER — Encounter: Payer: Self-pay | Admitting: Physical Therapy

## 2019-01-07 ENCOUNTER — Encounter (HOSPITAL_BASED_OUTPATIENT_CLINIC_OR_DEPARTMENT_OTHER): Payer: Self-pay | Admitting: *Deleted

## 2019-01-07 ENCOUNTER — Encounter: Payer: Self-pay | Admitting: Family Medicine

## 2019-01-07 DIAGNOSIS — J45909 Unspecified asthma, uncomplicated: Secondary | ICD-10-CM | POA: Insufficient documentation

## 2019-01-07 DIAGNOSIS — Z8249 Family history of ischemic heart disease and other diseases of the circulatory system: Secondary | ICD-10-CM | POA: Diagnosis not present

## 2019-01-07 DIAGNOSIS — Z7951 Long term (current) use of inhaled steroids: Secondary | ICD-10-CM | POA: Diagnosis not present

## 2019-01-07 DIAGNOSIS — Z7984 Long term (current) use of oral hypoglycemic drugs: Secondary | ICD-10-CM | POA: Diagnosis not present

## 2019-01-07 DIAGNOSIS — M109 Gout, unspecified: Secondary | ICD-10-CM | POA: Insufficient documentation

## 2019-01-07 DIAGNOSIS — K219 Gastro-esophageal reflux disease without esophagitis: Secondary | ICD-10-CM | POA: Diagnosis not present

## 2019-01-07 DIAGNOSIS — D6862 Lupus anticoagulant syndrome: Secondary | ICD-10-CM | POA: Insufficient documentation

## 2019-01-07 DIAGNOSIS — E1121 Type 2 diabetes mellitus with diabetic nephropathy: Secondary | ICD-10-CM | POA: Diagnosis not present

## 2019-01-07 DIAGNOSIS — I493 Ventricular premature depolarization: Secondary | ICD-10-CM | POA: Diagnosis not present

## 2019-01-07 DIAGNOSIS — E1140 Type 2 diabetes mellitus with diabetic neuropathy, unspecified: Secondary | ICD-10-CM | POA: Insufficient documentation

## 2019-01-07 DIAGNOSIS — R2689 Other abnormalities of gait and mobility: Secondary | ICD-10-CM

## 2019-01-07 DIAGNOSIS — I5032 Chronic diastolic (congestive) heart failure: Secondary | ICD-10-CM | POA: Diagnosis not present

## 2019-01-07 DIAGNOSIS — E785 Hyperlipidemia, unspecified: Secondary | ICD-10-CM | POA: Insufficient documentation

## 2019-01-07 DIAGNOSIS — R0602 Shortness of breath: Secondary | ICD-10-CM | POA: Insufficient documentation

## 2019-01-07 DIAGNOSIS — M6281 Muscle weakness (generalized): Secondary | ICD-10-CM | POA: Diagnosis not present

## 2019-01-07 DIAGNOSIS — Z7982 Long term (current) use of aspirin: Secondary | ICD-10-CM | POA: Diagnosis not present

## 2019-01-07 DIAGNOSIS — Z833 Family history of diabetes mellitus: Secondary | ICD-10-CM | POA: Diagnosis not present

## 2019-01-07 DIAGNOSIS — Z86711 Personal history of pulmonary embolism: Secondary | ICD-10-CM | POA: Diagnosis not present

## 2019-01-07 DIAGNOSIS — I2699 Other pulmonary embolism without acute cor pulmonale: Secondary | ICD-10-CM | POA: Diagnosis not present

## 2019-01-07 DIAGNOSIS — F03A0 Unspecified dementia, mild, without behavioral disturbance, psychotic disturbance, mood disturbance, and anxiety: Secondary | ICD-10-CM | POA: Diagnosis present

## 2019-01-07 DIAGNOSIS — R42 Dizziness and giddiness: Secondary | ICD-10-CM

## 2019-01-07 DIAGNOSIS — I2690 Septic pulmonary embolism without acute cor pulmonale: Secondary | ICD-10-CM | POA: Diagnosis not present

## 2019-01-07 DIAGNOSIS — I10 Essential (primary) hypertension: Secondary | ICD-10-CM | POA: Diagnosis present

## 2019-01-07 DIAGNOSIS — G4733 Obstructive sleep apnea (adult) (pediatric): Secondary | ICD-10-CM | POA: Diagnosis present

## 2019-01-07 DIAGNOSIS — Z79899 Other long term (current) drug therapy: Secondary | ICD-10-CM | POA: Insufficient documentation

## 2019-01-07 DIAGNOSIS — I110 Hypertensive heart disease with heart failure: Secondary | ICD-10-CM | POA: Insufficient documentation

## 2019-01-07 DIAGNOSIS — Z87891 Personal history of nicotine dependence: Secondary | ICD-10-CM | POA: Diagnosis not present

## 2019-01-07 DIAGNOSIS — F0390 Unspecified dementia without behavioral disturbance: Secondary | ICD-10-CM | POA: Diagnosis not present

## 2019-01-07 DIAGNOSIS — R2681 Unsteadiness on feet: Secondary | ICD-10-CM

## 2019-01-07 DIAGNOSIS — J45991 Cough variant asthma: Secondary | ICD-10-CM | POA: Diagnosis present

## 2019-01-07 HISTORY — DX: Other pulmonary embolism without acute cor pulmonale: I26.99

## 2019-01-07 LAB — TROPONIN I: Troponin I: <0.03 ng/mL (ref <0.03)

## 2019-01-07 LAB — BRAIN NATRIURETIC PEPTIDE: B Natriuretic Peptide: 36.5 pg/mL (ref 0.0–100.0)

## 2019-01-07 MED ORDER — SODIUM CHLORIDE 0.9% FLUSH: 3.0000 mL | Freq: Two times a day (BID) | INTRAVENOUS | Status: DC

## 2019-01-07 MED ORDER — HEPARIN BOLUS VIA INFUSION
5000.0000 [IU] | Freq: Once | INTRAVENOUS | Status: AC
  Administered 2019-01-07: 5000 [IU] via INTRAVENOUS

## 2019-01-07 MED ORDER — GABAPENTIN 100 MG PO CAPS
100.0000 mg | ORAL_CAPSULE | Freq: Every day | ORAL | Status: DC
  Administered 2019-01-08: 100 mg via ORAL
  Filled 2019-01-07: qty 1

## 2019-01-07 MED ORDER — ONDANSETRON HCL 4 MG/2ML IJ SOLN: 4.0000 mg | Freq: Four times a day (QID) | INTRAMUSCULAR | Status: DC | PRN

## 2019-01-07 MED ORDER — ONDANSETRON HCL 4 MG PO TABS: 4.0000 mg | ORAL_TABLET | Freq: Four times a day (QID) | ORAL | Status: DC | PRN

## 2019-01-07 MED ORDER — MOMETASONE FURO-FORMOTEROL FUM 100-5 MCG/ACT IN AERO
2.0000 | INHALATION_SPRAY | Freq: Two times a day (BID) | RESPIRATORY_TRACT | Status: DC
  Administered 2019-01-08: 2 via RESPIRATORY_TRACT
  Filled 2019-01-07: qty 8.8

## 2019-01-07 MED ORDER — MEMANTINE HCL 10 MG PO TABS
20.0000 mg | ORAL_TABLET | Freq: Every day | ORAL | Status: DC
  Administered 2019-01-08: 20 mg via ORAL
  Filled 2019-01-07: qty 2

## 2019-01-07 MED ORDER — VERAPAMIL HCL ER 240 MG PO TBCR
240.0000 mg | EXTENDED_RELEASE_TABLET | Freq: Every day | ORAL | Status: DC
  Filled 2019-01-07: qty 1

## 2019-01-07 MED ORDER — PRAVASTATIN SODIUM 10 MG PO TABS
10.0000 mg | ORAL_TABLET | Freq: Every day | ORAL | Status: DC
  Filled 2019-01-07: qty 1

## 2019-01-07 MED ORDER — INSULIN ASPART 100 UNIT/ML ~~LOC~~ SOLN: 0.0000 [IU] | Freq: Three times a day (TID) | SUBCUTANEOUS | Status: DC

## 2019-01-07 MED ORDER — HEPARIN (PORCINE) 25000 UT/250ML-% IV SOLN
1250.0000 [IU]/h | INTRAVENOUS | Status: AC
  Administered 2019-01-07: 1500 [IU]/h via INTRAVENOUS
  Administered 2019-01-08: 1350 [IU]/h via INTRAVENOUS
  Filled 2019-01-07 (×2): qty 250

## 2019-01-07 MED ORDER — IOPAMIDOL (ISOVUE-370) INJECTION 76%
100.0000 mL | Freq: Once | INTRAVENOUS | Status: AC | PRN
  Administered 2019-01-07: 100 mL via INTRAVENOUS

## 2019-01-07 MED ORDER — ALBUTEROL SULFATE (2.5 MG/3ML) 0.083% IN NEBU: 2.5000 mg | INHALATION_SOLUTION | Freq: Four times a day (QID) | RESPIRATORY_TRACT | Status: DC | PRN

## 2019-01-07 MED ORDER — INSULIN ASPART 100 UNIT/ML ~~LOC~~ SOLN: 0.0000 [IU] | Freq: Every day | SUBCUTANEOUS | Status: DC

## 2019-01-07 MED ORDER — TOPIRAMATE 25 MG PO TABS
50.0000 mg | ORAL_TABLET | Freq: Two times a day (BID) | ORAL | Status: DC
  Administered 2019-01-08 (×2): 50 mg via ORAL
  Filled 2019-01-07 (×2): qty 2

## 2019-01-07 MED ORDER — PANTOPRAZOLE SODIUM 40 MG PO TBEC
40.0000 mg | DELAYED_RELEASE_TABLET | Freq: Two times a day (BID) | ORAL | Status: DC
  Administered 2019-01-08 (×2): 40 mg via ORAL
  Filled 2019-01-07 (×2): qty 1

## 2019-01-07 MED ORDER — ACETAMINOPHEN 650 MG RE SUPP: 650.0000 mg | Freq: Four times a day (QID) | RECTAL | Status: DC | PRN

## 2019-01-07 MED ORDER — ALLOPURINOL 100 MG PO TABS
100.0000 mg | ORAL_TABLET | Freq: Every day | ORAL | Status: DC
  Administered 2019-01-08: 100 mg via ORAL
  Filled 2019-01-07: qty 1

## 2019-01-07 MED ORDER — POLYETHYLENE GLYCOL 3350 17 G PO PACK: 17.0000 g | PACK | Freq: Every day | ORAL | Status: DC | PRN

## 2019-01-07 MED ORDER — SODIUM CHLORIDE 0.9% FLUSH: 3.0000 mL | INTRAVENOUS | Status: DC | PRN

## 2019-01-07 MED ORDER — ACETAMINOPHEN 325 MG PO TABS
650.0000 mg | ORAL_TABLET | Freq: Four times a day (QID) | ORAL | Status: DC | PRN
  Administered 2019-01-08: 650 mg via ORAL
  Filled 2019-01-07: qty 2

## 2019-01-07 MED ORDER — HYDROCODONE-ACETAMINOPHEN 5-325 MG PO TABS: 1.0000 | ORAL_TABLET | ORAL | Status: DC | PRN

## 2019-01-07 MED ORDER — SODIUM CHLORIDE 0.9 % IV SOLN: 250.0000 mL | INTRAVENOUS | Status: DC | PRN
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
## 2019-01-07 NOTE — ED Triage Notes (Signed)
Pt c/o SOB , CTchest today DX PE

## 2019-01-07 NOTE — ED Notes (Signed)
Pt standing at bedside and using urinal
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
## 2019-01-07 NOTE — ED Notes (Signed)
Pt states he was taken off xarelto in November 2019 after being on it for 2 years without complication or worsening condition

## 2019-01-07 NOTE — Telephone Encounter (Signed)
Advised pt of abnormal D dimer and need to proceed with CTA. This is scheduled at 2:30 today and pt is aware.

## 2019-01-07 NOTE — ED Notes (Signed)
[facility] called for transport. 
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
## 2019-01-07 NOTE — Telephone Encounter (Signed)
Received telephone call from radiology tech re: Bilateral PE noted on CTA.  Advised tech to have patient proceed to the ED at the [facility] for further evaluation.  Report given to ER physician at the [facility].  I also spoke to the patient personally.

## 2019-01-07 NOTE — Telephone Encounter (Signed)
CT chest: Acute pulmonary embolism involving bilateral pulmonary arteries. There is no pulmonary infarct.Stable pulmonary nodules in bilateral lungs- unchanged compared to prior exam.

## 2019-01-08 ENCOUNTER — Observation Stay (HOSPITAL_BASED_OUTPATIENT_CLINIC_OR_DEPARTMENT_OTHER): Payer: Medicare Other

## 2019-01-08 DIAGNOSIS — R0602 Shortness of breath: Secondary | ICD-10-CM | POA: Diagnosis not present

## 2019-01-08 DIAGNOSIS — I2699 Other pulmonary embolism without acute cor pulmonale: Secondary | ICD-10-CM | POA: Diagnosis not present

## 2019-01-08 LAB — BASIC METABOLIC PANEL
ANION GAP: 7 (ref 5–15)
BUN: 13 mg/dL (ref 8–23)
CO2: 25 mmol/L (ref 22–32)
Calcium: 9.1 mg/dL (ref 8.9–10.3)
Chloride: 110 mmol/L (ref 98–111)
Creatinine, Ser: 1.1 mg/dL (ref 0.61–1.24)
GFR calc Af Amer: >60 mL/min (ref >60)
GFR calc non Af Amer: >60 mL/min (ref >60)
Glucose, Bld: 109 mg/dL — ABNORMAL HIGH (ref 70–99)
Potassium: 3.7 mmol/L (ref 3.5–5.1)
Sodium: 142 mmol/L (ref 135–145)

## 2019-01-08 LAB — GLUCOSE, CAPILLARY
Glucose-Capillary: 100 mg/dL — ABNORMAL HIGH (ref 70–99)
Glucose-Capillary: 102 mg/dL — ABNORMAL HIGH (ref 70–99)
Glucose-Capillary: 103 mg/dL — ABNORMAL HIGH (ref 70–99)
Glucose-Capillary: 93 mg/dL (ref 70–99)

## 2019-01-08 LAB — HEPARIN LEVEL (UNFRACTIONATED)
Heparin Unfractionated: 0.73 IU/mL — ABNORMAL HIGH (ref 0.30–0.70)
Heparin Unfractionated: 0.92 IU/mL — ABNORMAL HIGH (ref 0.30–0.70)

## 2019-01-08 LAB — ECHOCARDIOGRAM COMPLETE
Height: 69 in
Weight: 3244.8 oz

## 2019-01-08 LAB — HIV ANTIBODY (ROUTINE TESTING W REFLEX): HIV Screen 4th Generation wRfx: NONREACTIVE

## 2019-01-08 MED ORDER — RIVAROXABAN 15 MG PO TABS
15.0000 mg | ORAL_TABLET | Freq: Two times a day (BID) | ORAL | Status: DC
  Administered 2019-01-08: 15 mg via ORAL
  Filled 2019-01-08: qty 1

## 2019-01-08 MED ORDER — RIVAROXABAN (XARELTO) VTE STARTER PACK (15 & 20 MG): ORAL_TABLET | ORAL | 0 refills | Status: DC

## 2019-01-08 MED ORDER — FUROSEMIDE 20 MG PO TABS
20.0000 mg | ORAL_TABLET | Freq: Every day | ORAL | Status: DC
  Administered 2019-01-08: 20 mg via ORAL
  Filled 2019-01-08: qty 1

## 2019-01-08 MED FILL — XARELTO STARTER PACK: 15 & 20 | 30 days supply | Qty: 51 | Fill #0

## 2019-01-08 NOTE — Discharge Summary (Signed)
Physician Discharge Summary  Garrett [name] ZOX:[id number] DOB: 07-14-1951 DOA: 01/07/2019  PCP: Ann Held, DO  Admit date: 01/07/2019 Discharge date: 01/08/2019  Admitted From: home Discharge disposition: home   Recommendations for Outpatient Follow-Up:   1. Patient restarted on xarelto-- most likely will need lifelong treatment-- informed Dr. Elnoria Howard for follow up   Discharge Diagnosis:   Principal Problem:   Acute pulmonary embolism ([place]) Active Problems:   Controlled type 2 diabetes mellitus with diabetic nephropathy ([place])   Essential hypertension   Mild dementia (HCC)   OSA (obstructive sleep apnea)   Bilateral pulmonary embolism (HCC)   Cough variant asthma  vs uacs/ pseudoasthma   Chronic diastolic CHF (congestive heart failure) ([place])    Discharge Condition: Improved.  Diet recommendation: Low sodium, heart healthy.  Carbohydrate-modified.    Wound care: None.  Code status: Full.   History of Present Illness:   Garrett [name] is a 68 y.o. male with medical history significant for chronic diastolic CHF, type 2 diabetes mellitus, hypertension, lupus anticoagulant with history of PE status post a year of Xarelto and now off of anticoagulation and presenting for evaluation of shortness of breath, nonproductive cough, and bilateral acute PE on outpatient imaging.  Patient was diagnosed with acute pulmonary embolism and February 2018, was seen by hematology, found to have lupus anticoagulant, and was treated with Xarelto until November 2019.  He tolerated the Xarelto without incident.  Over the past few days, he has had shortness of breath, worse with any exertion, and nonproductive cough.  Denies any hemoptysis, chest pain, or lower extremity swelling or tenderness.  He was in a car trip to the [place] over Thanksgiving.    Hospital Course by Problem:   1. Acute bilateral pulmonary emboli  - Presents with several days of SOB and non-productive  cough  - Outpatient workup with d-dimer 2.61 and acute subsegmental PE b/l on CTA  - He has hx of PE, followed by hematology, was found to have lupus anticoagulant, completed a year of Xarelto and was then transitioned off of anticoagulation to ASA 325 daily in November '19  - He rode in a car to the [place] over Thanksgiving, but denies leg swelling or pain  -duplex negative -echo wnl -transitioned off heparin to xarelto as he did well in the past (price is $47)  2. Chronic diastolic CHF  - Appears compensated  -resume home meds  3. Type II DM  - A1c was 5.9% this month  - resume home meds  4. OSA  - CPAP qHS    5. Dementia  - Continue Namenda   6. Hypertension  - BP at goal       Medical Consultants:      Discharge Exam:   Vitals:   01/08/19 0859 01/08/19 1151  BP:  132/78  Pulse:  66  Resp:  14  Temp:  97.6 F (36.4 C)  SpO2: 96% 98%   Vitals:   01/08/19 0353 01/08/19 0500 01/08/19 0859 01/08/19 1151  BP: 102/80   132/78  Pulse: 73   66  Resp: (!) 28   14  Temp: 97.8 F (36.6 C)   97.6 F (36.4 C)  TempSrc: Oral   Oral  SpO2: 95%  96% 98%  Weight:  92 kg    Height:        General exam: Appears calm and comfortable.    The results of significant diagnostics from this hospitalization (including imaging,  microbiology, ancillary and laboratory) are listed below for reference.     Procedures and Diagnostic Studies:   Ct Angio Chest W/cm &/or Wo Cm  Result Date: 01/07/2019 CLINICAL DATA:  Shortness of breath since January 03, 2019 EXAM: CT ANGIOGRAPHY CHEST WITH CONTRAST TECHNIQUE: Multidetector CT imaging of the chest was performed using the standard protocol during bolus administration of intravenous contrast. Multiplanar CT image reconstructions and MIPs were obtained to evaluate the vascular anatomy. CONTRAST:  122mL ISOVUE-370 IOPAMIDOL (ISOVUE-370) INJECTION 76% COMPARISON:  May 08, 2018, May 13, 2017. FINDINGS: Cardiovascular: There is  pulmonary embolus involving the left upper, left lower, right lower lobes subsegmental pulmonary arteries. There is atherosclerosis of the aorta without aortic aneurysm. The heart size is normal. There is no pericardial effusion. Mediastinum/Nodes: No enlarged mediastinal, hilar, or axillary lymph nodes. Thyroid gland, trachea demonstrate no abnormal finding. There is a hiatal hernia. Lungs/Pleura: There are pulmonary nodules in bilateral lungs unchanged compared to prior CT of May 08, 2018. There is no pulmonary infarct. There is no focal pneumonia. There is no pleural effusion. Upper Abdomen: There are low-density lesions within the liver unchanged compared to prior exam. The other visualized upper abdominal structures are unremarkable. Musculoskeletal: Degenerative joint changes of the spine are noted. Review of the MIP images confirms the above findings. IMPRESSION: Acute pulmonary embolus involving bilateral pulmonary arteries as described. There is no pulmonary infarct. Stable pulmonary nodules in bilateral lungs unchanged compared to prior exam. The lesions are stable since prior exam of May 13, 2017 and are considered benign. These results will be called to the ordering clinician or representative by the Radiologist Assistant, and communication documented in the PACS or zVision Dashboard. Electronically Signed   By: Abelardo Diesel M.D.   On: 01/07/2019 15:04   Vas [place] Lower Extremity Venous (dvt)  Result Date: 01/08/2019  Lower Venous Study Indications: Pulmonary embolism.  Performing Technologist: Abram Sander RVS  Examination Guidelines: A complete evaluation includes B-mode imaging, spectral Doppler, color Doppler, and power Doppler as needed of all accessible portions of each vessel. Bilateral testing is considered an integral part of a complete examination. Limited examinations for reoccurring indications may be performed as noted.  Right Venous Findings:  +---------+---------------+---------+-----------+----------+-------+          CompressibilityPhasicitySpontaneityPropertiesSummary +---------+---------------+---------+-----------+----------+-------+ CFV      Full           Yes      Yes                          +---------+---------------+---------+-----------+----------+-------+ SFJ      Full                                                 +---------+---------------+---------+-----------+----------+-------+ FV Prox  Full                                                 +---------+---------------+---------+-----------+----------+-------+ FV Mid   Full                                                 +---------+---------------+---------+-----------+----------+-------+  FV DistalFull                                                 +---------+---------------+---------+-----------+----------+-------+ PFV      Full                                                 +---------+---------------+---------+-----------+----------+-------+ POP      Full           Yes      Yes                          +---------+---------------+---------+-----------+----------+-------+ PTV      Full                                                 +---------+---------------+---------+-----------+----------+-------+ PERO     Full                                                 +---------+---------------+---------+-----------+----------+-------+  Left Venous Findings: +---------+---------------+---------+-----------+----------+--------------+          CompressibilityPhasicitySpontaneityPropertiesSummary        +---------+---------------+---------+-----------+----------+--------------+ CFV      Full           Yes      Yes                                 +---------+---------------+---------+-----------+----------+--------------+ SFJ      Full                                                         +---------+---------------+---------+-----------+----------+--------------+ FV Prox  Full                                                        +---------+---------------+---------+-----------+----------+--------------+ FV Mid   Full                                                        +---------+---------------+---------+-----------+----------+--------------+ FV DistalFull                                                        +---------+---------------+---------+-----------+----------+--------------+ PFV      Full                                                        +---------+---------------+---------+-----------+----------+--------------+  POP      Full           Yes      Yes                                 +---------+---------------+---------+-----------+----------+--------------+ PTV      Full                                                        +---------+---------------+---------+-----------+----------+--------------+ PERO                                                  Not visualized +---------+---------------+---------+-----------+----------+--------------+    Summary: Right: There is no evidence of deep vein thrombosis in the lower extremity. No cystic structure found in the popliteal fossa. Left: There is no evidence of deep vein thrombosis in the lower extremity. No cystic structure found in the popliteal fossa.  *See table(s) above for measurements and observations.    Preliminary      Labs:   Basic Metabolic Panel: Recent Labs  Lab 01/06/19 1311 01/08/19 0012  NA 141 142  K 4.2 3.7  CL 106 110  CO2 27 25  GLUCOSE 104* 109*  BUN 19 13  CREATININE 1.13 1.10  CALCIUM 9.8 9.1   GFR Estimated Creatinine Clearance: 72 mL/min (by C-G formula based on SCr of 1.1 mg/dL). Liver Function Tests: No results for input(s): AST, ALT, ALKPHOS, BILITOT, PROT, ALBUMIN in the last 168 hours. No results for input(s): LIPASE, AMYLASE in the  last 168 hours. No results for input(s): AMMONIA in the last 168 hours. Coagulation profile No results for input(s): INR, PROTIME in the last 168 hours.  CBC: Recent Labs  Lab 01/06/19 1311  WBC 8.6  NEUTROABS 5.4  HGB 13.7  HCT 41.6  MCV 90.9  PLT 237.0   Cardiac Enzymes: Recent Labs  Lab 01/07/19 1535  TROPONINI <0.03   BNP: Invalid input(s): POCBNP CBG: Recent Labs  Lab 01/08/19 0017 01/08/19 0614 01/08/19 1112  GLUCAP 93 102* 103*   D-Dimer Recent Labs    01/06/19 1311  DDIMER 2.61*   Hgb A1c Recent Labs    01/06/19 1311  HGBA1C 5.9   Lipid Profile No results for input(s): CHOL, HDL, LDLCALC, TRIG, CHOLHDL, LDLDIRECT in the last 72 hours. Thyroid function studies No results for input(s): TSH, T4TOTAL, T3FREE, THYROIDAB in the last 72 hours.  Invalid input(s): FREET3 Anemia work up No results for input(s): VITAMINB12, FOLATE, FERRITIN, TIBC, IRON, RETICCTPCT in the last 72 hours. Microbiology No results found for this or any previous visit (from the past 240 hour(s)).   Discharge Instructions:   Discharge Instructions    Diet - low sodium heart healthy   Complete by:  As directed    Diet Carb Modified   Complete by:  As directed    Increase activity slowly   Complete by:  As directed      Allergies as of 01/08/2019      Reactions   Bee Venom Anaphylaxis   Garlic Swelling      Medication List    STOP taking these medications  aspirin 325 MG tablet     TAKE these medications   acidophilus Caps capsule Take 1 capsule by mouth daily.   allopurinol 100 MG tablet Commonly known as:  ZYLOPRIM TAKE 1 TABLET BY MOUTH DAILY.   budesonide-formoterol 80-4.5 MCG/ACT inhaler Commonly known as:  SYMBICORT Inhale 2 puffs into the lungs 2 (two) times daily.   celecoxib 200 MG capsule Commonly known as:  CELEBREX Take 1 capsule (200 mg total) by mouth 2 (two) times daily.   EPIPEN 0.3 mg/0.3 mL Soaj injection Generic drug:   EPINEPHrine Inject 0.3 mg into the muscle as needed for anaphylaxis.   furosemide 20 MG tablet Commonly known as:  LASIX TAKE 1 TABLET BY MOUTH DAILY.   gabapentin 100 MG capsule Commonly known as:  NEURONTIN Take 100 mg by mouth at bedtime.   levocetirizine 5 MG tablet Commonly known as:  XYZAL Take 1 tablet (5 mg total) by mouth every evening.   magnesium oxide 400 (241.3 Mg) MG tablet Commonly known as:  MAG-OX Take 1 tablet (400 mg total) by mouth daily.   meclizine 25 MG tablet Commonly known as:  ANTIVERT Take 1 tablet (25 mg total) by mouth 3 (three) times daily as needed for dizziness.   memantine 10 MG tablet Commonly known as:  NAMENDA Take 2 tablets (20 mg total) by mouth daily.   metFORMIN 500 MG tablet Commonly known as:  GLUCOPHAGE Take 1 tablet (500 mg total) by mouth 2 (two) times daily with a meal.   metoprolol tartrate 25 MG tablet Commonly known as:  LOPRESSOR Take 1 tablet (25 mg total) by mouth daily.   mometasone 50 MCG/ACT nasal spray Commonly known as:  NASONEX Place 2 sprays into the nose daily.   ONE TOUCH ULTRA TEST test strip Generic drug:  glucose blood USE AS DIRECTED THREE TIMES DAILY   ONE-A-DAY WEIGHT SMART ADVANCE PO Take 1 tablet by mouth daily.   pantoprazole 40 MG tablet Commonly known as:  PROTONIX TAKE 1 TABLET BY MOUTH TWICE DAILY What changed:  when to take this   potassium chloride SA 20 MEQ tablet Commonly known as:  K-DUR,KLOR-CON Take 2 tablets (40 mEq total) by mouth daily.   pravastatin 10 MG tablet Commonly known as:  PRAVACHOL Take 1 tablet (10 mg total) by mouth daily. What changed:  when to take this   albuterol (5 MG/ML) 0.5% nebulizer solution Commonly known as:  PROVENTIL Take 2.5 mg by nebulization every 6 (six) hours as needed for wheezing or shortness of breath.   PROAIR HFA 108 (90 Base) MCG/ACT inhaler Generic drug:  albuterol Inhale 1 puff into the lungs every 6 (six) hours as needed for  wheezing or shortness of breath.   Rivaroxaban 15 & 20 MG Tbpk Take as directed:Start with one 15mg  tablet by mouth twice a day with food. On Day 22, switch to one 20mg  tablet once a day with food.   SUMAtriptan 50 MG tablet Commonly known as:  IMITREX Take 1 tablet as needed for migraine/vertigo. Do not take more than 3 a week   topiramate 50 MG tablet Commonly known as:  TOPAMAX Take 1 tablet (50 mg total) by mouth 2 (two) times daily.   verapamil 240 MG CR tablet Commonly known as:  CALAN-SR TAKE 1 TABLET BY MOUTH AT BEDTIME.   vitamin B-12 500 MCG tablet Commonly known as:  CYANOCOBALAMIN Take 500 mcg by mouth daily.      Follow-up Information    [name]  R, DO Follow up in 1 week(s).   Specialty:  Family Medicine Contact information: [address] [place] [address] [phone]            Time coordinating discharge: 25 min  Signed:  Geradine Girt DO  [facility] 01/08/2019, 4:06 PM

## 2019-01-08 NOTE — Progress Notes (Signed)
Bilateral lower extremity venous duplex has been completed.   Preliminary results in CV Proc.   Abram Sander 01/08/2019 8:27 AM

## 2019-01-08 NOTE — Care Management Obs Status (Signed)
[facility] NOTIFICATION   Patient Details  Name: Garrett [name] MRN: [id number] Date of Birth: 09-17-1951   Medicare Observation Status Notification Given:  Yes    Dawayne Patricia, RN 01/08/2019, 3:44 PM

## 2019-01-08 NOTE — Care Management Note (Signed)
Case Management Note Marvetta Gibbons RN, BSN Transitions of Care Unit 4E- RN Case Manager [phone]  Patient Details  Name: Garrett [name] MRN: [id number] Date of Birth: 27-Nov-1951  Subjective/Objective:      Pt admitted with bil PE              Action/Plan: PTA pt lived at home with wife- plan to return home with wife- benefits check for doacs- copays for either drug- $47- plan to restart Xarelto- spoke with pt and wife at bedside- pt unsure if he ever used 30 day free card- card provided for pt to try at discharge. [facility] [place]. No other CM needs noted for transition home.   Expected Discharge Date:                  Expected Discharge Plan:  Home/Self Care  In-House Referral:     Discharge planning Services  CM Consult, Medication Assistance  Post Acute Care Choice:  NA Choice offered to:  NA  DME Arranged:    DME Agency:     HH Arranged:    [place] Agency:     Status of Service:  Completed, signed off  If discussed at [employer] of Stay Meetings, dates discussed:    Additional Comments:  Dawayne Patricia, RN 01/08/2019, 3:53 PM

## 2019-01-08 NOTE — Discharge Instructions (Signed)
Information on my medicine - XARELTO (rivaroxaban)  This medication education was reviewed with me or my healthcare representative as part of my discharge preparation.  The pharmacist that spoke with me during my hospital stay was:  Tamela Gammon, PharmD  WHY WAS [place]? Xarelto was prescribed to treat blood clots that may have been found in the veins of your legs (deep vein thrombosis) or in your lungs (pulmonary embolism) and to reduce the risk of them occurring again.  What do you need to know about Xarelto? The starting dose is one 15 mg tablet taken TWICE daily with food for the FIRST 21 DAYS then  the dose is changed to one 20 mg tablet taken ONCE A DAY with your evening meal.  DO NOT stop taking Xarelto without talking to the health care provider who prescribed the medication.  Refill your prescription for 20 mg tablets before you run out.  After discharge, you should have regular check-up appointments with your healthcare provider that is prescribing your Xarelto.  In the future your dose may need to be changed if your kidney function changes by a significant amount.  What do you do if you miss a dose? If you are taking Xarelto TWICE DAILY and you miss a dose, take it as soon as you remember. You may take two 15 mg tablets (total 30 mg) at the same time then resume your regularly scheduled 15 mg twice daily the next day.  If you are taking Xarelto ONCE DAILY and you miss a dose, take it as soon as you remember on the same day then continue your regularly scheduled once daily regimen the next day. Do not take two doses of Xarelto at the same time.   Important Safety Information Xarelto is a blood thinner medicine that can cause bleeding. You should call your healthcare provider right away if you experience any of the following: ? Bleeding from an injury or your nose that does not stop. ? Unusual colored urine (red or dark brown) or unusual colored stools  (red or black). ? Unusual bruising for unknown reasons. ? A serious fall or if you hit your head (even if there is no bleeding).  Some medicines may interact with Xarelto and might increase your risk of bleeding while on Xarelto. To help avoid this, consult your healthcare provider or pharmacist prior to using any new prescription or non-prescription medications, including herbals, vitamins, non-steroidal anti-inflammatory drugs (NSAIDs) and supplements.  This website has more information on Xarelto: [email or web address]
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
## 2019-01-08 NOTE — Care Management (Signed)
#    4.   S/W  DOMONIQUIE  @ [place]  RX # [id number]  1. XARELTO  20 MG DAILY COVER- YES CO-PAY- $ 47.00 TIER  - 3 DRUG PRIOR APPROVAL- NO  2. ELIQUIS  5 MG BID COVER- YES CO-PAY-  $ 47.00 TIER- 3 DRUG PRIOR APPROVAL- NO  [facility]ERRED [facility]ARMAC[facility] : [facility]

## 2019-01-08 NOTE — Progress Notes (Signed)
  Echocardiogram 2D Echocardiogram has been performed.  Garrett [name] 01/08/2019, 11:08 AM

## 2019-01-12 ENCOUNTER — Ambulatory Visit: Payer: Medicare Other

## 2019-01-12 ENCOUNTER — Encounter: Payer: Self-pay | Admitting: Family Medicine

## 2019-01-12 NOTE — Telephone Encounter (Signed)
This did not need to come to me---- he needs an appointment or 2

## 2019-01-13 ENCOUNTER — Ambulatory Visit: Payer: PPO | Admitting: Adult Health

## 2019-01-13 MED FILL — MEMANTINE HCL 10 MG TABS: 10 | 90 days supply | Qty: 180 | Fill #3

## 2019-01-13 MED FILL — PRAVASTATIN SODIUM 10 MG TA: 10 | 90 days supply | Qty: 90 | Fill #1

## 2019-01-14 ENCOUNTER — Ambulatory Visit: Payer: Medicare Other | Admitting: Adult Health

## 2019-01-14 ENCOUNTER — Encounter: Payer: Self-pay | Admitting: Adult Health

## 2019-01-14 VITALS — BP 118/82 | HR 71 | Ht 69.0 in | Wt 210.0 lb

## 2019-01-14 DIAGNOSIS — J45991 Cough variant asthma: Secondary | ICD-10-CM

## 2019-01-14 DIAGNOSIS — I2699 Other pulmonary embolism without acute cor pulmonale: Secondary | ICD-10-CM | POA: Diagnosis not present

## 2019-01-14 DIAGNOSIS — G4733 Obstructive sleep apnea (adult) (pediatric): Secondary | ICD-10-CM

## 2019-01-14 DIAGNOSIS — I5032 Chronic diastolic (congestive) heart failure: Secondary | ICD-10-CM

## 2019-01-14 DIAGNOSIS — R918 Other nonspecific abnormal finding of lung field: Secondary | ICD-10-CM

## 2019-01-14 MED ORDER — RIVAROXABAN 20 MG PO TABS: 20.0000 mg | ORAL_TABLET | Freq: Every day | ORAL | 5 refills | Status: DC

## 2019-01-14 NOTE — Assessment & Plan Note (Signed)
Recurrent PE , + lupus anticoagulant .  Unfortunately patient developed recurrent PE off of Xarelto.  Will most likely need lifelong anticoagulation. He seems to be doing well on Xarelto currently.  He is to finish his Xarelto starter pack.  Then transition to Xarelto 20 mg daily.  Prescription was sent. Education on Xarelto.  Is advised to avoid nonsteroidals due to risk of bleeding.  Patient is taking Celebrex and was advised against this. Venous Dopplers were negative.  2D echo showed no evidence of right heart strain. We will follow-up in 3 months. He is to keep his follow-up with hematology.
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
## 2019-01-14 NOTE — Assessment & Plan Note (Signed)
Mild sleep apnea.  Patient is having trouble with his CPAP machine.  Will order a new CPAP machine.   Patient is to wear each night for at least 4 to 6 hours.  Will begin on AutoSet 5 to 15 cm H2O. CPAP download on return.

## 2019-01-14 NOTE — Assessment & Plan Note (Signed)
Multiple pulmonary nodules noted on CT scan.  Most recent CT scan this month showed stability.  We will repeat CT chest in January 2021

## 2019-01-14 NOTE — Assessment & Plan Note (Signed)
Appears compensated without evidence of volume overload.  Continue on current regimen 

## 2019-01-14 NOTE — Assessment & Plan Note (Signed)
Patient is doing well on Symbicort. Continue on current regimen along with trigger prevention

## 2019-01-14 NOTE — Patient Instructions (Addendum)
Continue on Xarelto Starter pack , then transition to Xarelto 20mg  daily .  Avoid NSAIDs including Celebrex .  Report any sign of bleeding  Follow up with Dr. Marin Olp as planned .  Continue on Symbicort 2 puffs Twice daily  , rinse after use.  CT chest in 12/2019  to follow lung nodules.  Continue on CPAP At bedtime  .  Order for new CPAP machine [facility]  Activity as tolerated.  May return to Balance therapy as tolerated.  Follow up with Dr. Melvyn Novas  Or [place] in 3 months and As needed   Please contact office for sooner follow up if symptoms do not improve or worsen or seek emergency care

## 2019-01-15 ENCOUNTER — Ambulatory Visit: Payer: Medicare Other

## 2019-01-15 VITALS — BP 126/81 | HR 70

## 2019-01-15 DIAGNOSIS — R2681 Unsteadiness on feet: Secondary | ICD-10-CM

## 2019-01-15 DIAGNOSIS — R42 Dizziness and giddiness: Secondary | ICD-10-CM

## 2019-01-15 DIAGNOSIS — M6281 Muscle weakness (generalized): Secondary | ICD-10-CM | POA: Diagnosis not present

## 2019-01-15 DIAGNOSIS — R2689 Other abnormalities of gait and mobility: Secondary | ICD-10-CM
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
## 2019-01-18 ENCOUNTER — Encounter: Payer: Self-pay | Admitting: Physical Therapy

## 2019-01-18 ENCOUNTER — Ambulatory Visit: Payer: Medicare Other | Admitting: Physical Therapy

## 2019-01-18 DIAGNOSIS — R2681 Unsteadiness on feet: Secondary | ICD-10-CM | POA: Diagnosis not present

## 2019-01-18 DIAGNOSIS — R2689 Other abnormalities of gait and mobility: Secondary | ICD-10-CM

## 2019-01-18 DIAGNOSIS — M6281 Muscle weakness (generalized): Secondary | ICD-10-CM | POA: Diagnosis not present

## 2019-01-18 DIAGNOSIS — R42 Dizziness and giddiness: Secondary | ICD-10-CM | POA: Diagnosis not present

## 2019-01-18 NOTE — Patient Instructions (Addendum)
Access Code: [email or web address]  URL: [email or web address]  Date: 01/18/2019  Prepared by: Misty Stanley   Exercises  Backwards Walking - 4 reps - 1 sets - 1x daily - 5x weekly  Side Stepping with Counter Support - 4 reps - 1 sets - 1x daily - 5x weekly  Standing March with Counter Support - 4 reps - 1 sets - 1x daily - 5x weekly  Standing Gaze Stabilization with Head Rotation - 2 sets - 60 seconds hold - 2x daily - 7x weekly  Standing Gaze Stabilization with Head Nod - 2 sets - 60 seconds hold - 2x daily - 7x weekly  Romberg Stance with Head Nods - 10 reps - 1 sets - 2x daily - 7x weekly  Romberg Stance with Head Rotation - 10 reps - 1 sets - 2x daily - 7x weekly  Romberg Stance with Eyes Closed - 1 sets - 10 seconds hold - 2x daily - 7x weekly  Supine Figure 4 Piriformis Stretch - 2-3 reps - 1 sets - 30-60 hold - 1x daily - 7x weekly  Ankle and Toe Plantarflexion with Resistance - 10 reps - 2 sets - 1x daily - 7x weekly  Supine Bridge with Mini Swiss Ball Between Knees - 10 reps - 2 sets - 1x daily - 7x weekly  Clamshell - 10 reps - 2 sets - 1x daily - 3x weekly  Sidelying Diagonal Hip Abduction - 10 reps - 2 sets - 1x daily - 3x weekly
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
## 2019-01-21 ENCOUNTER — Other Ambulatory Visit: Payer: Self-pay | Admitting: Family Medicine

## 2019-01-21 ENCOUNTER — Ambulatory Visit: Payer: Medicare Other

## 2019-01-21 DIAGNOSIS — R059 Cough, unspecified: Secondary | ICD-10-CM

## 2019-01-21 DIAGNOSIS — R05 Cough: Secondary | ICD-10-CM

## 2019-01-22 DIAGNOSIS — G4733 Obstructive sleep apnea (adult) (pediatric): Secondary | ICD-10-CM | POA: Diagnosis not present

## 2019-01-22 DIAGNOSIS — J189 Pneumonia, unspecified organism: Secondary | ICD-10-CM | POA: Diagnosis not present

## 2019-01-25 NOTE — Progress Notes (Signed)
Chart and office note reviewed in detail  > agree with a/p as outlined    

## 2019-01-26 ENCOUNTER — Ambulatory Visit: Payer: Medicare Other

## 2019-01-28 ENCOUNTER — Ambulatory Visit: Payer: Medicare Other

## 2019-02-01 ENCOUNTER — Encounter: Payer: Self-pay | Admitting: Family Medicine

## 2019-02-01 DIAGNOSIS — K219 Gastro-esophageal reflux disease without esophagitis: Secondary | ICD-10-CM

## 2019-02-01 DIAGNOSIS — D500 Iron deficiency anemia secondary to blood loss (chronic): Secondary | ICD-10-CM

## 2019-02-01 DIAGNOSIS — I10 Essential (primary) hypertension: Secondary | ICD-10-CM

## 2019-02-01 DIAGNOSIS — I2699 Other pulmonary embolism without acute cor pulmonale: Secondary | ICD-10-CM

## 2019-02-01 MED ORDER — PANTOPRAZOLE SODIUM 40 MG PO TBEC: 40.0000 mg | DELAYED_RELEASE_TABLET | Freq: Two times a day (BID) | ORAL | 3 refills | Status: DC

## 2019-02-01 MED ORDER — VERAPAMIL HCL ER 240 MG PO TBCR: 240.0000 mg | EXTENDED_RELEASE_TABLET | Freq: Every day | ORAL | 1 refills | Status: DC

## 2019-02-01 MED ORDER — CELECOXIB 200 MG PO CAPS: 200.0000 mg | ORAL_CAPSULE | Freq: Two times a day (BID) | ORAL | 1 refills | Status: DC

## 2019-02-02 ENCOUNTER — Ambulatory Visit: Payer: Medicare Other | Attending: Neurology | Admitting: Physical Therapy

## 2019-02-02 DIAGNOSIS — R2681 Unsteadiness on feet: Secondary | ICD-10-CM | POA: Diagnosis not present

## 2019-02-02 DIAGNOSIS — R2689 Other abnormalities of gait and mobility: Secondary | ICD-10-CM | POA: Diagnosis not present

## 2019-02-02 DIAGNOSIS — M6281 Muscle weakness (generalized): Secondary | ICD-10-CM | POA: Insufficient documentation

## 2019-02-02 NOTE — Patient Instructions (Signed)
Gastroc / Heel Cord Stretch - On Step    Stand with heels over edge of stair. Holding rail, lower heels until stretch is felt in calf of legs. Repeat _1-2__ times. Do _2__ times per day. Use 3' - 4" block on floor - hold onto counter     Heel Cord Stretch    Place one leg forward, bent, other leg behind and straight. Lean forward keeping back heel flat. Hold __30-45__ seconds while counting out loud. Repeat with other leg. Repeat __1-2__ times. Do _2___ sessions per day.  DO STANDING AT COUNTER
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
## 2019-02-04 ENCOUNTER — Ambulatory Visit: Payer: Medicare Other | Admitting: Physical Therapy

## 2019-02-04 DIAGNOSIS — R2689 Other abnormalities of gait and mobility: Secondary | ICD-10-CM | POA: Diagnosis not present

## 2019-02-04 DIAGNOSIS — M6281 Muscle weakness (generalized): Secondary | ICD-10-CM

## 2019-02-04 DIAGNOSIS — R2681 Unsteadiness on feet: Secondary | ICD-10-CM | POA: Diagnosis not present

## 2019-02-04 NOTE — Patient Instructions (Signed)
Sit to stand - no hands  Heel cord stretch - runner's stretch  Squat - almost sit down but don't - return to standing - try without hands  Ankle sway - see picture (ROCKING)   BALANCE EXS.: Marching IN PLACE  Forward, back, and side kicks - alternate legs  Sidestepping with squats     Strengthening: Hip Flexion - Resisted    With tubing around left ankle, anchor behind, bring leg forward, keeping knee straight. Repeat __10__ times per set. Do _1___ sets per session. Do _1-2___ sessions per day.   STEP ON BAND WITH ONE FOOT - LIFT OTHER LEG UP WITH KNEE BENT  - 10 times 1-2x/day   Strengthening: Hip Extension -   With tubing around right ankle, face anchor and pull leg straight back. Repeat _10___ times per set. Do _1___ sets per session. Do _1-2___ sessions per day.  http://orth.exer.us/636   Copyright  VHI. All rights reserved.  Strengthening: Hip Abduction - Resisted    With tubing around right leg, other side toward anchor, extend leg out from side. Repeat _10___ times per set. Do _1___ sets per session. Do _1-2___ sessions per day.  http://orth.exer.us/634   Copyright  VHI. All rights reserved.    http://orth.exer.us/638   Copyright  VHI. All rights reserved.    Theraband exercises for hip strengtheningAnterior / Posterior Sway    Stand in neutral posture. Keeping head in neutral position, rock back and forth, toes up then heels up. Rock __10__ times. Do _1__ sets.   http://bt.exer.us/266   Copyright  VHI. All rights reserved.
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
## 2019-02-09 ENCOUNTER — Ambulatory Visit: Payer: Medicare Other | Admitting: Physical Therapy

## 2019-02-11 ENCOUNTER — Encounter: Payer: Medicare Other | Admitting: Physical Therapy

## 2019-02-18 DIAGNOSIS — H40013 Open angle with borderline findings, low risk, bilateral: Secondary | ICD-10-CM | POA: Diagnosis not present

## 2019-02-18 DIAGNOSIS — H26492 Other secondary cataract, left eye: Secondary | ICD-10-CM | POA: Diagnosis not present

## 2019-02-18 DIAGNOSIS — E119 Type 2 diabetes mellitus without complications: Secondary | ICD-10-CM | POA: Diagnosis not present

## 2019-02-18 DIAGNOSIS — H5022 Vertical strabismus, left eye: Secondary | ICD-10-CM | POA: Diagnosis not present

## 2019-02-18 DIAGNOSIS — H524 Presbyopia: Secondary | ICD-10-CM | POA: Diagnosis not present

## 2019-02-19 ENCOUNTER — Ambulatory Visit: Payer: Medicare Other | Admitting: Physical Therapy

## 2019-02-20 DIAGNOSIS — G4733 Obstructive sleep apnea (adult) (pediatric): Secondary | ICD-10-CM | POA: Diagnosis not present

## 2019-02-23 ENCOUNTER — Encounter: Payer: Self-pay | Admitting: Family Medicine

## 2019-02-23 ENCOUNTER — Other Ambulatory Visit: Payer: Self-pay | Admitting: Family Medicine

## 2019-02-23 ENCOUNTER — Ambulatory Visit (INDEPENDENT_AMBULATORY_CARE_PROVIDER_SITE_OTHER): Payer: Medicare Other | Admitting: Family Medicine

## 2019-02-23 ENCOUNTER — Ambulatory Visit (HOSPITAL_BASED_OUTPATIENT_CLINIC_OR_DEPARTMENT_OTHER)
Admission: RE | Admit: 2019-02-23 | Discharge: 2019-02-23 | Disposition: A | Discharge status: Home / Self Care | Payer: Medicare Other | Source: Ambulatory Visit | Attending: Family Medicine | Admitting: Family Medicine

## 2019-02-23 VITALS — BP 136/92 | HR 88 | Temp 98.6°F | Ht 69.0 in | Wt 210.0 lb

## 2019-02-23 DIAGNOSIS — J209 Acute bronchitis, unspecified: Secondary | ICD-10-CM

## 2019-02-23 DIAGNOSIS — R05 Cough: Secondary | ICD-10-CM

## 2019-02-23 DIAGNOSIS — R059 Cough, unspecified: Secondary | ICD-10-CM

## 2019-02-23 MED ORDER — PREDNISONE 10 MG PO TABS: ORAL_TABLET | ORAL | 0 refills | Status: DC

## 2019-02-23 MED ORDER — AZITHROMYCIN 250 MG PO TABS: ORAL_TABLET | ORAL | 0 refills | Status: DC

## 2019-02-23 MED ORDER — ALBUTEROL SULFATE (2.5 MG/3ML) 0.083% IN NEBU
2.5000 mg | INHALATION_SOLUTION | Freq: Once | RESPIRATORY_TRACT | Status: AC
  Administered 2019-02-23: 2.5 mg via RESPIRATORY_TRACT

## 2019-02-23 MED ORDER — METHYLPREDNISOLONE ACETATE 80 MG/ML IJ SUSP
80.0000 mg | Freq: Once | INTRAMUSCULAR | Status: AC
  Administered 2019-02-23: 80 mg via INTRAMUSCULAR

## 2019-02-23 MED ORDER — BENZONATATE 200 MG PO CAPS: 200.0000 mg | ORAL_CAPSULE | Freq: Two times a day (BID) | ORAL | 0 refills | Status: DC | PRN

## 2019-02-23 MED ORDER — PROMETHAZINE-DM 6.25-15 MG/5ML PO SYRP: 5.0000 mL | ORAL_SOLUTION | Freq: Four times a day (QID) | ORAL | 0 refills | Status: DC | PRN

## 2019-02-23 NOTE — Patient Instructions (Signed)
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
## 2019-02-23 NOTE — Telephone Encounter (Signed)
Medication on backorder. Please advise. 

## 2019-02-25 ENCOUNTER — Encounter: Payer: Self-pay | Admitting: Family Medicine

## 2019-02-25 DIAGNOSIS — H26491 Other secondary cataract, right eye: Secondary | ICD-10-CM | POA: Diagnosis not present

## 2019-02-26 ENCOUNTER — Other Ambulatory Visit: Payer: Self-pay | Admitting: Family Medicine

## 2019-02-26 MED ORDER — ALBUTEROL SULFATE (5 MG/ML) 0.5% IN NEBU: 2.5000 mg | INHALATION_SOLUTION | Freq: Four times a day (QID) | RESPIRATORY_TRACT | 5 refills | Status: DC | PRN

## 2019-02-26 NOTE — Telephone Encounter (Signed)
Does he just want a printed rx to take with him?

## 2019-03-03 ENCOUNTER — Ambulatory Visit: Payer: Self-pay | Admitting: Family Medicine

## 2019-03-03 NOTE — Telephone Encounter (Signed)
I returned his call.  He was seen by Dr. Carollee Herter on 02/23/2019.   His symptoms initially got better but now they are back.   Has head congestion and still coughing up yellow mucus.  He feels like he has fever in the evenings.  He was wondering if she could call him in something.   He has a f/u appt with her on 03/10/2019 or there about.    I sent these notes to Dr. Carollee Herter for further disposition.  Reason for Disposition . [1] Known COPD or other severe lung disease (i.e., bronchiectasis, cystic fibrosis, lung surgery) AND [2] worsening symptoms (i.e., increased sputum purulence or amount, increased breathing difficulty  Answer Assessment - Initial Assessment Questions 1. ONSET: "When did the cough begin?"       I saw Dr. Etter Sjogren 02/23/2019.    I was better and now my symptoms are coming back.   I coughing up yellow mucus.   Nasal congestion.  No sore throat or earache. 2. SEVERITY: "How bad is the cough today?"      The cough is bad at night.    I use CPAP and the coughing is messing it up. 3. RESPIRATORY DISTRESS: "Describe your breathing."      I use the nebulizer a couple times a day like she said.   I'm still shorter of breath than normal. 4. FEVER: "Do you have a fever?" If so, ask: "What is your temperature, how was it measured, and when did it start?"     I feel like maybe I have fever in the evenings.   I feel flushed and my eyes burn plus I'm very tired. 5. SPUTUM: "Describe the color of your sputum" (clear, white, yellow, green)     Yellow light colored 6. HEMOPTYSIS: "Are you coughing up any blood?" If so ask: "How much?" (flecks, streaks, tablespoons, etc.)     No 7. CARDIAC HISTORY: "Do you have any history of heart disease?" (e.g., heart attack, congestive heart failure)      No 8. LUNG HISTORY: "Do you have any history of lung disease?"  (e.g., pulmonary embolus, asthma, emphysema)     Asthma.   Yes had blood clot in January in my lungs.  I'm on Xalrelto. 9. PE RISK  FACTORS: "Do you have a history of blood clots?" (or: recent major surgery, recent prolonged travel, bedridden)     Yes in my lungs. 10. OTHER SYMPTOMS: "Do you have any other symptoms?" (e.g., runny nose, wheezing, chest pain)       Runny nose, feels heavy across the breast bone and hurts when I cough.   11. PREGNANCY: "Is there any chance you are pregnant?" "When was your last menstrual period?"       N/A 12. TRAVEL: "Have you traveled out of the country in the last month?" (e.g., travel history, exposures)       No  Protocols used: [place]

## 2019-03-05 MED ORDER — LEVOFLOXACIN 500 MG PO TABS: 500.0000 mg | ORAL_TABLET | Freq: Every day | ORAL | 0 refills | Status: DC

## 2019-03-05 NOTE — Telephone Encounter (Signed)
levaquin 500 mg 1 po qd x 7 days  

## 2019-03-05 NOTE — Addendum Note (Signed)
Addended byDamita Dunnings D on: 03/05/2019 10:44 AM   Modules accepted: Orders

## 2019-03-05 NOTE — Telephone Encounter (Signed)
Spoke w/ Pt- informed that Rx for Levaquin has been sent.

## 2019-03-05 NOTE — Telephone Encounter (Signed)
Please advise 

## 2019-03-11 ENCOUNTER — Ambulatory Visit: Payer: Medicare Other | Admitting: Family Medicine

## 2019-03-12 MED FILL — FUROSEMIDE 20 MG TABS: 20 | 90 days supply | Qty: 90 | Fill #3

## 2019-03-15 ENCOUNTER — Telehealth: Payer: Self-pay | Admitting: Physical Therapy

## 2019-03-15 NOTE — Telephone Encounter (Signed)
Garrett [name] was contacted today regarding the temporary closing of [facility] due to Covid-19.  Therapist discussed:  Possible date for re-opening and rescheduling cancelled visits as well as possibility of E-visits.  Patient IS interested in further information for an e-visit, virtual check in, or telehealth visit, if those services become available.    [facility] will follow up with patients when we are able to resume care.  Rico Junker, PT, DPT 03/15/19    2:08 PM    [facility] [address] [place], [place]  [address] Phone:  [phone] Fax:  [phone] \

## 2019-03-19 ENCOUNTER — Other Ambulatory Visit: Payer: Self-pay | Admitting: Family Medicine

## 2019-03-19 DIAGNOSIS — E785 Hyperlipidemia, unspecified: Secondary | ICD-10-CM

## 2019-03-19 DIAGNOSIS — M1000 Idiopathic gout, unspecified site: Secondary | ICD-10-CM

## 2019-03-19 DIAGNOSIS — R413 Other amnesia: Secondary | ICD-10-CM

## 2019-03-21 IMAGING — MR MR HEAD WO/W CM
11 of 12 series · 43 of 48 positions shown · IV contrast (multihance)
Comparison: MRI of the head January 24, 2016

CLINICAL DATA: Progressive dizziness, tremors. Bilateral extremity
numbness and weakness. History of migraine headache, hypertension.

EXAM:
MRI HEAD WITHOUT AND WITH CONTRAST
TECHNIQUE: Multiplanar, multiecho pulse sequences of the brain and surrounding
structures were obtained without and with intravenous contrast.
CONTRAST:  20mL MULTIHANCE GADOBENATE DIMEGLUMINE 529 MG/ML IV SOLN

[Series 5: T1 · sagittal · 4.0mm · 0.90mm/px · 3 of 28 slices shown (1 of 3)]
[im 1/28]
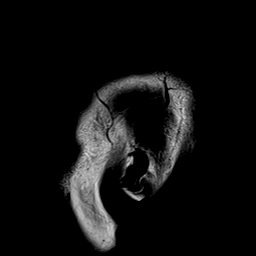
[im 14/28]
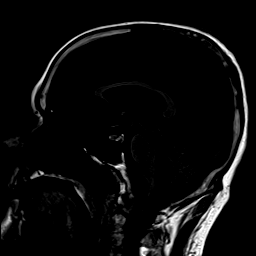
[im 28/28]
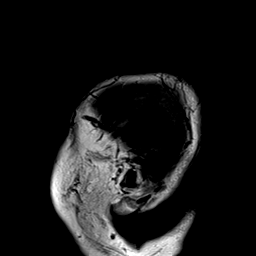

[Series 6: DWI · axial · 3.0mm · 1.44mm/px · z∈[-49,+118]mm · 7 of 88 slices shown (1 of 2)]
[im 1/88]
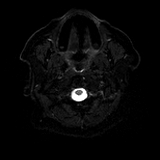
[im 15/88]
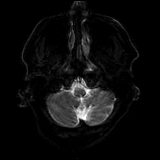
[im 30/88]
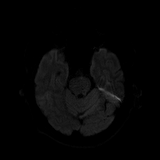
[im 44/88]
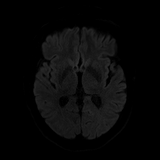
[im 59/88]
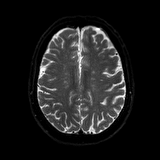
[im 73/88]
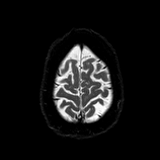
[im 88/88]
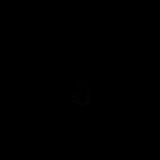

[Series 7: DWI · axial · 3.0mm · 1.44mm/px · z∈[-49,+118]mm · 4 of 44 slices shown (2 of 2)]
[im 1/44]
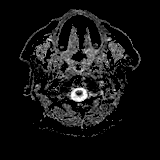
[im 15/44]
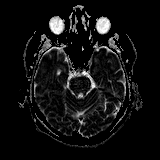
[im 29/44]
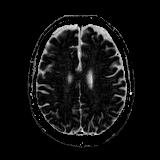
[im 44/44]
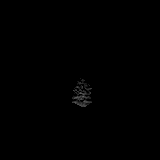

[Series 8: T2 · axial · 4.0mm · 0.36mm/px · z∈[-39,+107]mm · 2 of 29 slices shown]
[im 1/29]
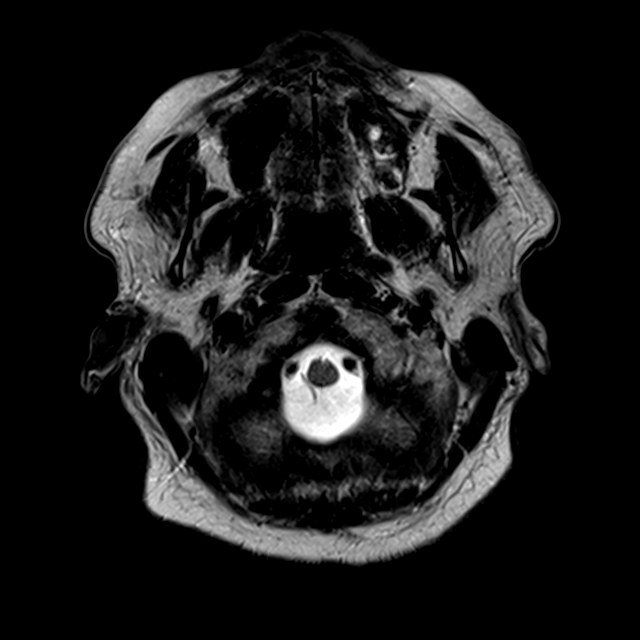
[im 29/29]
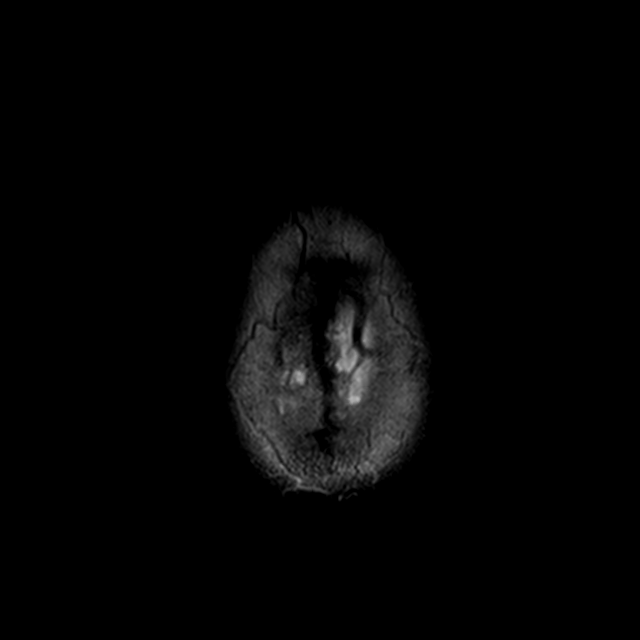

[Series 9: FLAIR · axial · 3.0mm · 0.72mm/px · z∈[-47,+115]mm · 2 of 28 slices shown]
[im 1/28]
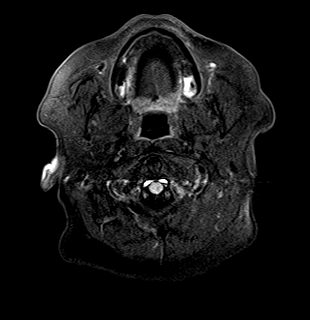
[im 28/28]
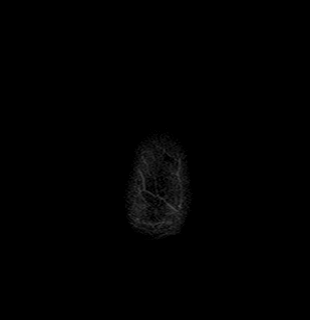

[Series 11: swi_images · axial · 1.5mm · 1.20mm/px · z∈[-39,+103]mm · 8 of 96 slices shown]
[im 1/96]
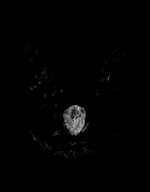
[im 14/96]
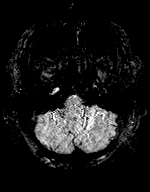
[im 28/96]
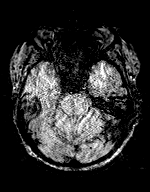
[im 41/96]
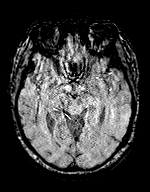
[im 55/96]
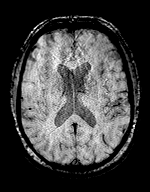
[im 68/96]
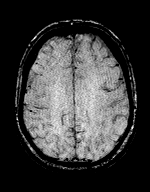
[im 82/96]
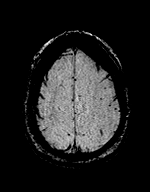
[im 96/96]
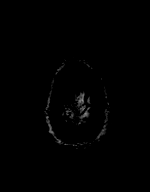

[Series 12: T1 · coronal · 2.5mm · 0.70mm/px · 1 of 15 slices shown (2 of 3)]
[im 1/15]
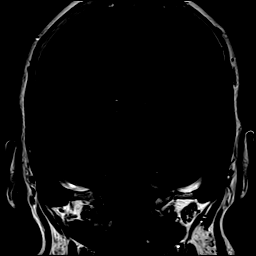

[Series 13: T1 · axial · 2.5mm · 0.62mm/px · 1 of 15 slices shown (3 of 3)]
[im 1/15]
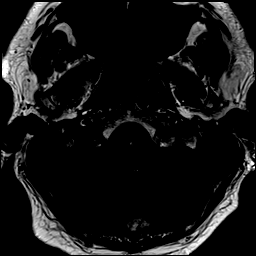

[Series 15: T1 post-contrast · coronal · 2.5mm · 0.70mm/px · 1 of 15 slices shown (1 of 3)]
[im 1/15]
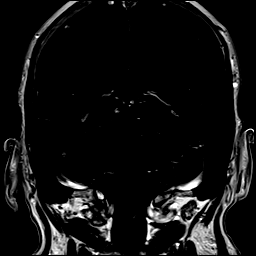

[Series 16: T1 post-contrast · axial · 2.5mm · 0.62mm/px · 1 of 15 slices shown (2 of 3)]
[im 1/15]
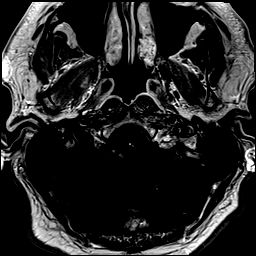

[Series 17: T1 post-contrast · axial · 1.0mm · 0.90mm/px · z∈[-46,+113]mm · 13 of 160 slices shown (3 of 3)]
[im 1/160]
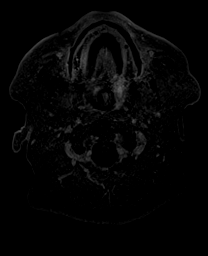
[im 14/160]
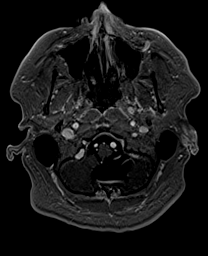
[im 27/160]
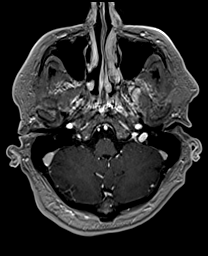
[im 40/160]
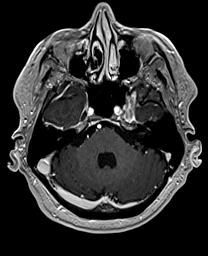
[im 54/160]
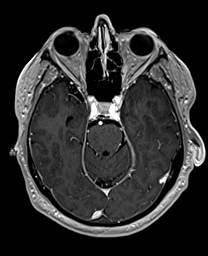
[im 67/160]
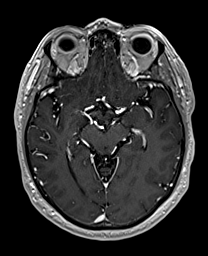
[im 80/160]
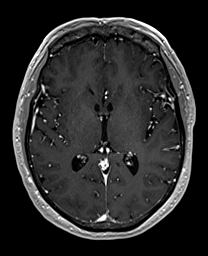
[im 93/160]
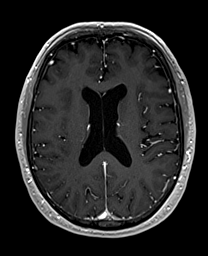
[im 107/160]
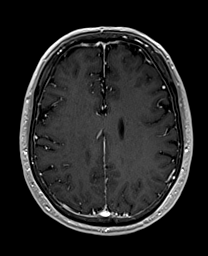
[im 120/160]
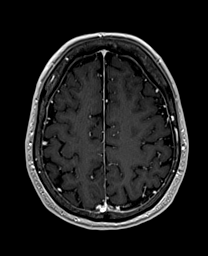
[im 133/160]
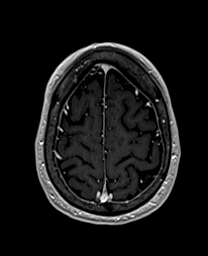
[im 146/160]
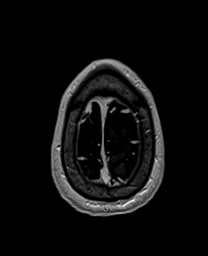
[im 160/160]
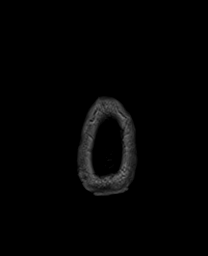

[43 of 48 positions shown; findings below may reference images not displayed]

FINDINGS: INTERNAL AUDITORY CANALS: No cerebellar pontine angle masses. Normal
course, caliber and signal of the bilateral seventh and eighth
cranial nerves without abnormal enhancement. Symmetric, normal
internal auditory canals. Normal appearance of the inner ear
structures.

INTRACRANIAL CONTENTS: No reduced diffusion to suggest acute
ischemia or hypercellular tumor. No susceptibility artifact to
suggest hemorrhage. The ventricles and sulci are normal for
patient's age. Scattered subcentimeter supratentorial and patchy
pontine white matter FLAIR T2 hyperintensities have increased. No
suspicious parenchymal signal, mass lesions, mass effect. No
abnormal intraparenchymal or extra-axial enhancement. No abnormal
extra-axial fluid collections. No extra-axial masses.

VASCULAR: Normal major intracranial vascular flow voids present at
skull base.

SKULL AND UPPER CERVICAL SPINE: No abnormal sellar expansion. No
suspicious calvarial bone marrow signal. Craniocervical junction
maintained.

SINUSES/ ORBITS: The mastoid air-cells and included paranasal
sinuses are well-aerated.The included ocular globes and orbital
contents are non-suspicious. Status post bilateral ocular lens
implants.

OTHER: Patient is edentulous.
IMPRESSION: 1. Normal MRI of the internal auditory canals with and without
contrast.
2. No acute intracranial process.
3. Increased mild moderate chronic small vessel ischemic changes.

## 2019-03-22 MED FILL — ALLOPURINOL 100 MG TABS: 100 | 90 days supply | Qty: 90 | Fill #0

## 2019-03-22 MED FILL — MEMANTINE HCL 10 MG TABS: 10 | 90 days supply | Qty: 180 | Fill #0

## 2019-03-22 MED FILL — ONE TOUCH ULTRA TEST STRIPS: 33 days supply | Qty: 100 | Fill #0

## 2019-03-22 MED FILL — PRAVASTATIN SODIUM 10 MG TA: 10 | 90 days supply | Qty: 90 | Fill #0

## 2019-03-23 DIAGNOSIS — J189 Pneumonia, unspecified organism: Secondary | ICD-10-CM | POA: Diagnosis not present

## 2019-03-23 DIAGNOSIS — G4733 Obstructive sleep apnea (adult) (pediatric): Secondary | ICD-10-CM | POA: Diagnosis not present

## 2019-03-29 ENCOUNTER — Encounter: Payer: Medicare Other | Admitting: Family Medicine

## 2019-04-05 ENCOUNTER — Encounter: Payer: Self-pay | Admitting: Family Medicine

## 2019-04-05 ENCOUNTER — Other Ambulatory Visit: Payer: Self-pay

## 2019-04-05 ENCOUNTER — Ambulatory Visit (INDEPENDENT_AMBULATORY_CARE_PROVIDER_SITE_OTHER): Payer: Medicare Other | Admitting: Family Medicine

## 2019-04-05 VITALS — BP 126/85 | HR 74

## 2019-04-05 DIAGNOSIS — E785 Hyperlipidemia, unspecified: Secondary | ICD-10-CM | POA: Diagnosis not present

## 2019-04-05 DIAGNOSIS — E1169 Type 2 diabetes mellitus with other specified complication: Secondary | ICD-10-CM | POA: Diagnosis not present

## 2019-04-05 DIAGNOSIS — E1129 Type 2 diabetes mellitus with other diabetic kidney complication: Secondary | ICD-10-CM | POA: Diagnosis not present

## 2019-04-05 DIAGNOSIS — I10 Essential (primary) hypertension: Secondary | ICD-10-CM | POA: Diagnosis not present

## 2019-04-05 DIAGNOSIS — T23109A Burn of first degree of unspecified hand, unspecified site, initial encounter: Secondary | ICD-10-CM | POA: Diagnosis not present
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
## 2019-04-09 ENCOUNTER — Other Ambulatory Visit: Payer: Self-pay

## 2019-04-09 ENCOUNTER — Ambulatory Visit (INDEPENDENT_AMBULATORY_CARE_PROVIDER_SITE_OTHER): Payer: Medicare Other

## 2019-04-09 ENCOUNTER — Other Ambulatory Visit (INDEPENDENT_AMBULATORY_CARE_PROVIDER_SITE_OTHER): Payer: Medicare Other

## 2019-04-09 DIAGNOSIS — E1169 Type 2 diabetes mellitus with other specified complication: Secondary | ICD-10-CM

## 2019-04-09 DIAGNOSIS — E1129 Type 2 diabetes mellitus with other diabetic kidney complication: Secondary | ICD-10-CM

## 2019-04-09 DIAGNOSIS — I10 Essential (primary) hypertension: Secondary | ICD-10-CM

## 2019-04-09 DIAGNOSIS — E785 Hyperlipidemia, unspecified: Secondary | ICD-10-CM | POA: Diagnosis not present

## 2019-04-09 DIAGNOSIS — Z23 Encounter for immunization: Secondary | ICD-10-CM | POA: Diagnosis not present

## 2019-04-09 DIAGNOSIS — M4302 Spondylolysis, cervical region: Secondary | ICD-10-CM

## 2019-04-09 LAB — COMPREHENSIVE METABOLIC PANEL
ALT: 39 U/L (ref 0–53)
AST: 26 U/L (ref 0–37)
Albumin: 4.1 g/dL (ref 3.5–5.2)
Alkaline Phosphatase: 85 U/L (ref 39–117)
BUN: 18 mg/dL (ref 6–23)
CO2: 22 mEq/L (ref 19–32)
Calcium: 9 mg/dL (ref 8.4–10.5)
Chloride: 109 mEq/L (ref 96–112)
Creatinine, Ser: 1.05 mg/dL (ref 0.40–1.50)
GFR: 70.04 mL/min (ref >60.00)
Glucose, Bld: 112 mg/dL — ABNORMAL HIGH (ref 70–99)
Potassium: 3.8 mEq/L (ref 3.5–5.1)
Sodium: 141 mEq/L (ref 135–145)
Total Bilirubin: 0.3 mg/dL (ref 0.2–1.2)
Total Protein: 6.7 g/dL (ref 6.0–8.3)

## 2019-04-09 LAB — LIPID PANEL
Cholesterol: 170 mg/dL (ref 0–200)
HDL: 46.3 mg/dL (ref >39.00)
LDL Cholesterol: 91 mg/dL (ref 0–99)
NonHDL: 124.09
Total CHOL/HDL Ratio: 4
Triglycerides: 167 mg/dL — ABNORMAL HIGH (ref 0.0–149.0)
VLDL: 33.4 mg/dL (ref 0.0–40.0)

## 2019-04-09 LAB — MICROALBUMIN / CREATININE URINE RATIO
Creatinine,U: 24.8 mg/dL
Microalb Creat Ratio: 2.8 mg/g (ref 0.0–30.0)
Microalb, Ur: <0.7 mg/dL (ref 0.0–1.9)

## 2019-04-09 LAB — HEMOGLOBIN A1C: Hgb A1c MFr Bld: 6.2 % (ref 4.6–6.5)

## 2019-04-15 ENCOUNTER — Other Ambulatory Visit: Payer: Self-pay

## 2019-04-15 ENCOUNTER — Encounter: Payer: Self-pay | Admitting: Nurse Practitioner

## 2019-04-15 ENCOUNTER — Ambulatory Visit: Payer: Medicare Other | Admitting: Internal Medicine

## 2019-04-15 ENCOUNTER — Ambulatory Visit (INDEPENDENT_AMBULATORY_CARE_PROVIDER_SITE_OTHER): Payer: Medicare Other | Admitting: Nurse Practitioner

## 2019-04-15 ENCOUNTER — Other Ambulatory Visit: Payer: Self-pay | Admitting: General Surgery

## 2019-04-15 DIAGNOSIS — G4733 Obstructive sleep apnea (adult) (pediatric): Secondary | ICD-10-CM

## 2019-04-15 DIAGNOSIS — R918 Other nonspecific abnormal finding of lung field: Secondary | ICD-10-CM | POA: Diagnosis not present

## 2019-04-15 DIAGNOSIS — I2699 Other pulmonary embolism without acute cor pulmonale: Secondary | ICD-10-CM | POA: Diagnosis not present

## 2019-04-15 DIAGNOSIS — J45991 Cough variant asthma: Secondary | ICD-10-CM

## 2019-04-15 NOTE — Assessment & Plan Note (Signed)
Patient recent CT scan showed stability with multiple pulmonary nodules.  Plan to repeat CT scan of chest in January 2021

## 2019-04-15 NOTE — Assessment & Plan Note (Signed)
Continue Symbicort Continue albuterol as needed

## 2019-04-15 NOTE — Patient Instructions (Signed)
Continue Xarelto 20mg  daily .  Avoid NSAIDs including Celebrex .  Report any sign of bleeding  Continue on Symbicort 2 puffs Twice daily  , rinse after use.  CT chest in 12/2019  to follow lung nodules.  Continue on CPAP At bedtime  .  Activity as tolerated.   Follow up with Dr. Melvyn Novas  in 4 months and As needed   Please contact office for sooner follow up if symptoms do not improve or worsen or seek emergency care

## 2019-04-15 NOTE — Progress Notes (Signed)
Virtual Visit via Telephone Note  I connected with Garrett [name] on 04/15/19 at  9:00 AM EDT by telephone and verified that I am speaking with the correct person using two identifiers.   I discussed the limitations, risks, security and privacy concerns of performing an evaluation and management service by telephone and the availability of in person appointments. I also discussed with the patient that there may be a patient responsible charge related to this service. The patient expressed understanding and agreed to proceed.   History of Present Illness: 68 year old male former smoker followed for cough variant asthma, pulmonary embolism, lung nodules, and OSA.  He is a patient of Dr. Melvyn Novas. PMH: Diabetes, diastolic heart failure, hypertension  Patient has a tele-visit today for follow-up.  Patient was diagnosed with pulmonary embolism in January 2020.  He was last seen by Rexene Edison on 01/14/2019.  Patient was initially treated with heparin and transition to [place].  He is currently on Xarelto.  His echo was normal with no evidence of cor pulmonale.  Patient was advised to follow-up with hematology and does have an upcoming appointment.  He states that his shortness of breath and cough have resolved.  No chest pain, orthopnea, syncope, or hemoptysis.  Patient has a history of sleep apnea and is on CPAP.  He has been compliant with his CPAP.  He was ordered a new CPAP machine at his last visit and states that it is been working well.  He does need supplies and has contacted [facility] but they have not sent his supplies.  He is requesting for [place] to call them and reorder supplies.  States that he does benefit from wearing his CPAP and is much less drowsy during the day after using it.  Note: CPAP compliance report 03/15/19 - 04/13/19: Usage days 25/30 (83%), Average usage 6 hours 2 minutes, CPAP Auto Set 5-15 cmH20, AHI: 2.5  Patient is on Symbicort twice daily for cough variant asthma.  He  states that overall his breathing is doing well.  Denies any significant shortness of breath.  He has had bronchitis since his last visit with our office.  He was treated by his PCP and states that he is doing better now.     Observations/Objective:  CTa 02/04/2017 - Bilateral pulmonary artery emboli. No findings for right heart Strain.Venous dopplers >>scheduled for 02/19/17 >neg Bilaterally  See CT chest 03/15/16  - 9/25/2017Primarily similar bilateral pulmonary nodules measuring maximally 8 mm. A left upper lobe pulmonary nodule measures 6 mm and is new. Given the new nodule and smoking history, followup at 6-12 months>REC F/U IN 09/16/17 as quit smoking 1992  - CTa chest 02/04/17 > Multiple stable pulmonary nodules. Two new adjacent nodules in the left lobe are likely inflammatory - CTa chest 05/13/17 Stable bilateral pulmonary nodules are noted. Follow-up CT scan in 12 months is recommended to ensure stability.>placed in tickle file for 05/13/18 -Spirometry 01/2017 Mild restriction HST 01/14/14 >> AHI 6 CT chest January 07, 2019 positive for bilateral pulmonary embolus, stable lung nodules 2D echo January 08, 2019  EF 55 to 60%,,   Assessment and Plan:  Bilateral pulmonary embolism: This was a recurrent PE after his Xarelto was stopped late last year.  Patient will most likely need lifelong anticoagulation.  He is doing well is compliant with Xarelto restart.  He is currently taking Xarelto 20 mg daily.  Education provided on Xarelto -avoid nonsteroidals due to risk of bleeding.  Patient continues to take  Celebrex and has been advised against this.   Keep follow-up with hematology  Cough Variant asthma: Continue Symbicort Continue albuterol as needed  Chronic diastolic CHF: Compensated without evidence of volume overload.  Continue current regimen.  Multiple pulmonary nodules: Patient recent CT scan showed stability with multiple pulmonary nodules.  Plan to repeat CT  scan of chest in January 2021  OSA: Patient is doing well with new CPAP machine. Patient continues to benefit from CPAP with good compliance and control documented Continue CPAP at current settings Continue current medications Goal of 4 hours or more usage per night Maintain healthy weight Do not drive if drowsy   Follow Up Instructions:  Follow up with Dr. Melvyn Novas in 4 months or sooner if needed   I discussed the assessment and treatment plan with the patient. The patient was provided an opportunity to ask questions and all were answered. The patient agreed with the plan and demonstrated an understanding of the instructions.   The patient was advised to call back or seek an in-person evaluation if the symptoms worsen or if the condition fails to improve as anticipated.  I provided 23 minutes of non-face-to-face time during this encounter.   Fenton Foy, NP

## 2019-04-15 NOTE — Assessment & Plan Note (Signed)
This was a recurrent PE after his Xarelto was stopped late last year.  Patient will most likely need lifelong anticoagulation.  He is doing well is compliant with Xarelto restart.  He is currently taking Xarelto 20 mg daily.  Education provided on Xarelto -avoid nonsteroidals due to risk of bleeding.  Patient continues to take Celebrex and has been advised against this.   Keep follow-up with hematology

## 2019-04-15 NOTE — Assessment & Plan Note (Signed)
Patient is doing well with new CPAP machine. Patient continues to benefit from CPAP with good compliance and control documented Continue CPAP at current settings Continue current medications Goal of 4 hours or more usage per night Maintain healthy weight Do not drive if drowsy

## 2019-04-16 ENCOUNTER — Other Ambulatory Visit: Payer: Medicare Other

## 2019-04-18 NOTE — Progress Notes (Signed)
Chart and office note reviewed in detail  > agree with a/p as outlined    

## 2019-04-21 ENCOUNTER — Other Ambulatory Visit: Payer: Self-pay | Admitting: *Deleted

## 2019-04-21 DIAGNOSIS — E785 Hyperlipidemia, unspecified: Secondary | ICD-10-CM

## 2019-04-21 DIAGNOSIS — I10 Essential (primary) hypertension: Secondary | ICD-10-CM

## 2019-04-21 DIAGNOSIS — R413 Other amnesia: Secondary | ICD-10-CM

## 2019-04-21 MED ORDER — METFORMIN HCL 500 MG PO TABS: 500.0000 mg | ORAL_TABLET | Freq: Two times a day (BID) | ORAL | 3 refills | Status: DC

## 2019-04-21 MED ORDER — FUROSEMIDE 20 MG PO TABS: 20.0000 mg | ORAL_TABLET | Freq: Every day | ORAL | 3 refills | Status: DC

## 2019-04-21 MED ORDER — POTASSIUM CHLORIDE CRYS ER 20 MEQ PO TBCR: 40.0000 meq | EXTENDED_RELEASE_TABLET | Freq: Every day | ORAL | 3 refills | Status: DC

## 2019-04-21 MED ORDER — METOPROLOL TARTRATE 25 MG PO TABS: 25.0000 mg | ORAL_TABLET | Freq: Every day | ORAL | 3 refills | Status: DC

## 2019-04-21 MED ORDER — PRAVASTATIN SODIUM 10 MG PO TABS: 10.0000 mg | ORAL_TABLET | Freq: Every day | ORAL | 3 refills | Status: DC

## 2019-04-21 MED ORDER — GLUCOSE BLOOD VI STRP: ORAL_STRIP | 3 refills | Status: DC

## 2019-04-21 MED ORDER — MEMANTINE HCL 10 MG PO TABS: 20.0000 mg | ORAL_TABLET | Freq: Every day | ORAL | 3 refills | Status: DC

## 2019-04-21 NOTE — Addendum Note (Signed)
Addended by: Kem Boroughs D on: 04/21/2019 01:47 PM   Modules accepted: Orders

## 2019-04-22 DIAGNOSIS — G4733 Obstructive sleep apnea (adult) (pediatric): Secondary | ICD-10-CM | POA: Diagnosis not present

## 2019-04-22 DIAGNOSIS — J189 Pneumonia, unspecified organism: Secondary | ICD-10-CM | POA: Diagnosis not present

## 2019-04-23 ENCOUNTER — Other Ambulatory Visit: Payer: Self-pay | Admitting: *Deleted

## 2019-04-23 DIAGNOSIS — D500 Iron deficiency anemia secondary to blood loss (chronic): Secondary | ICD-10-CM

## 2019-04-23 DIAGNOSIS — R059 Cough, unspecified: Secondary | ICD-10-CM

## 2019-04-23 DIAGNOSIS — M1000 Idiopathic gout, unspecified site: Secondary | ICD-10-CM

## 2019-04-23 DIAGNOSIS — K219 Gastro-esophageal reflux disease without esophagitis: Secondary | ICD-10-CM

## 2019-04-23 DIAGNOSIS — I10 Essential (primary) hypertension: Secondary | ICD-10-CM

## 2019-04-23 DIAGNOSIS — R05 Cough: Secondary | ICD-10-CM

## 2019-04-23 DIAGNOSIS — I2699 Other pulmonary embolism without acute cor pulmonale: Secondary | ICD-10-CM

## 2019-04-23 MED ORDER — PANTOPRAZOLE SODIUM 40 MG PO TBEC: 40.0000 mg | DELAYED_RELEASE_TABLET | Freq: Two times a day (BID) | ORAL | 3 refills | Status: DC

## 2019-04-23 MED ORDER — BUDESONIDE-FORMOTEROL FUMARATE 80-4.5 MCG/ACT IN AERO: 2.0000 | INHALATION_SPRAY | Freq: Two times a day (BID) | RESPIRATORY_TRACT | 3 refills | Status: DC

## 2019-04-23 MED ORDER — SUMATRIPTAN SUCCINATE 50 MG PO TABS: ORAL_TABLET | ORAL | 6 refills | Status: DC

## 2019-04-23 MED ORDER — VERAPAMIL HCL ER 240 MG PO TBCR: 240.0000 mg | EXTENDED_RELEASE_TABLET | Freq: Every day | ORAL | 1 refills | Status: DC

## 2019-04-23 MED ORDER — CELECOXIB 200 MG PO CAPS: 200.0000 mg | ORAL_CAPSULE | Freq: Two times a day (BID) | ORAL | 1 refills | Status: DC

## 2019-04-23 MED ORDER — ALLOPURINOL 100 MG PO TABS: 100.0000 mg | ORAL_TABLET | Freq: Every day | ORAL | 3 refills | Status: DC

## 2019-04-23 MED ORDER — LEVOCETIRIZINE DIHYDROCHLORIDE 5 MG PO TABS: 5.0000 mg | ORAL_TABLET | Freq: Every evening | ORAL | 1 refills | Status: DC

## 2019-04-24 ENCOUNTER — Other Ambulatory Visit: Payer: Self-pay | Admitting: Hematology & Oncology

## 2019-04-24 DIAGNOSIS — I2699 Other pulmonary embolism without acute cor pulmonale: Secondary | ICD-10-CM

## 2019-04-26 ENCOUNTER — Other Ambulatory Visit: Payer: Self-pay

## 2019-04-26 MED ORDER — TOPIRAMATE 50 MG PO TABS: 50.0000 mg | ORAL_TABLET | Freq: Two times a day (BID) | ORAL | 3 refills | Status: DC

## 2019-04-28 ENCOUNTER — Other Ambulatory Visit: Payer: Self-pay

## 2019-04-28 MED ORDER — TOPIRAMATE 50 MG PO TABS: 50.0000 mg | ORAL_TABLET | Freq: Two times a day (BID) | ORAL | 3 refills | Status: DC

## 2019-04-28 NOTE — Telephone Encounter (Signed)
Patient email asking about xarelto sending in a 90 day supply. He states request was sent to our office. I will route to Jess to see if she has received such request.   Jess have you received this request

## 2019-04-29 DIAGNOSIS — M25551 Pain in right hip: Secondary | ICD-10-CM | POA: Diagnosis not present

## 2019-04-29 MED ORDER — RIVAROXABAN 20 MG PO TABS: 20.0000 mg | ORAL_TABLET | Freq: Every day | ORAL | 1 refills | Status: DC

## 2019-04-29 NOTE — Telephone Encounter (Signed)
Yes, request has been received.  This can be e-scribed (not a controlled substance and this is an option per the received fax form) Rx sent to [facility]  Patient was recommended to avoid NSAIDS like Celebrex (currently on med list) at visit with Nils Pyle NP: Instructions  Return in about 4 months (around 08/15/2019) for follow up Dr. Melvyn Novas.  Continue Xarelto 20mg  daily .  Avoid NSAIDs including Celebrex .  Report any sign of bleeding  Continue on Symbicort 2 puffs Twice daily  , rinse after use.  CT chest in 12/2019  to follow lung nodules.  Continue on CPAP At bedtime  .  Activity as tolerated.    Follow up with Dr. Melvyn Novas  in 4 months and As needed   Please contact office for sooner follow up if symptoms do not improve or worsen or seek emergency care      E-mail sent to patient reminding him of [facility] NP recommendations

## 2019-05-06 ENCOUNTER — Other Ambulatory Visit: Payer: Self-pay | Admitting: Family Medicine

## 2019-05-07 ENCOUNTER — Ambulatory Visit (INDEPENDENT_AMBULATORY_CARE_PROVIDER_SITE_OTHER): Payer: Medicare Other | Admitting: Family Medicine

## 2019-05-07 ENCOUNTER — Encounter: Payer: Self-pay | Admitting: Family Medicine

## 2019-05-07 DIAGNOSIS — M48062 Spinal stenosis, lumbar region with neurogenic claudication: Secondary | ICD-10-CM | POA: Diagnosis not present

## 2019-05-07 MED ORDER — GABAPENTIN 100 MG PO CAPS: 200.0000 mg | ORAL_CAPSULE | Freq: Every day | ORAL | 3 refills | Status: DC

## 2019-05-07 NOTE — Assessment & Plan Note (Signed)
History of lumbar spondylolisthesis with neurogenic claudication.  Responding very well to gabapentin.  We discussed icing regimen and home exercise.  Discussed which activities of doing which wants to avoid.  Increase activity slowly and otherwise.  Patient can have a refill again in 1 year if necessary.  Patient will follow-up in 1 year as well can call me if anything changes.
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
## 2019-05-23 DIAGNOSIS — J189 Pneumonia, unspecified organism: Secondary | ICD-10-CM | POA: Diagnosis not present

## 2019-05-23 DIAGNOSIS — G4733 Obstructive sleep apnea (adult) (pediatric): Secondary | ICD-10-CM | POA: Diagnosis not present

## 2019-05-27 ENCOUNTER — Encounter: Payer: Medicare Other | Admitting: Family Medicine

## 2019-06-08 ENCOUNTER — Encounter: Payer: Self-pay | Admitting: Family Medicine

## 2019-06-08 NOTE — Telephone Encounter (Signed)
Increase lopressor to 50 mg bid and come in to office in 2 weeks

## 2019-06-13 ENCOUNTER — Other Ambulatory Visit: Payer: Self-pay

## 2019-06-13 ENCOUNTER — Emergency Department
Admission: EM | Admit: 2019-06-13 | Discharge: 2019-06-13 | Disposition: A | Discharge status: Home / Self Care | Payer: Medicare Other | Source: Home / Self Care | Attending: Family Medicine | Admitting: Family Medicine

## 2019-06-13 DIAGNOSIS — H04321 Acute dacryocystitis of right lacrimal passage: Secondary | ICD-10-CM

## 2019-06-13 MED ORDER — CLINDAMYCIN HCL 300 MG PO CAPS: ORAL_CAPSULE | ORAL | 0 refills | Status: DC

## 2019-06-13 MED ORDER — TOBRAMYCIN 0.3 % OP SOLN: OPHTHALMIC | 0 refills | Status: DC
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
## 2019-06-13 NOTE — Discharge Instructions (Addendum)
Apply a clean warm compress to the inside corner of your eye. To do this: Wash your hands first. Hold the compress over the inside corner of your eye for a few minutes. Repeat this every few hours during the day.  If symptoms become significantly worse during the night or over the weekend, proceed to the local emergency room.

## 2019-06-13 NOTE — ED Triage Notes (Signed)
Pt c/o eye swelling and redness since Wed. Had tried OTC ointment, and warm compresses. Denies any discharge. Pain 2/10. Hurts worse at night.

## 2019-06-15 LAB — EYE CULTURE
MICRO NUMBER:: 592716
SPECIMEN QUALITY:: ADEQUATE

## 2019-06-16 ENCOUNTER — Telehealth: Payer: Self-pay | Admitting: *Deleted

## 2019-06-16 NOTE — Telephone Encounter (Signed)
Spoke to pt's wife given lab results. She reports that he is doing much better.

## 2019-06-22 DIAGNOSIS — J189 Pneumonia, unspecified organism: Secondary | ICD-10-CM | POA: Diagnosis not present

## 2019-06-22 DIAGNOSIS — G4733 Obstructive sleep apnea (adult) (pediatric): Secondary | ICD-10-CM | POA: Diagnosis not present

## 2019-06-23 ENCOUNTER — Encounter: Payer: Self-pay | Admitting: Family Medicine

## 2019-06-23 DIAGNOSIS — H00019 Hordeolum externum unspecified eye, unspecified eyelid: Secondary | ICD-10-CM

## 2019-06-23 HISTORY — DX: Hordeolum externum unspecified eye, unspecified eyelid: H00.019

## 2019-06-25 ENCOUNTER — Other Ambulatory Visit: Payer: Self-pay | Admitting: Family Medicine

## 2019-06-25 DIAGNOSIS — I10 Essential (primary) hypertension: Secondary | ICD-10-CM

## 2019-06-25 MED ORDER — METOPROLOL TARTRATE 25 MG PO TABS: 25.0000 mg | ORAL_TABLET | Freq: Two times a day (BID) | ORAL | 3 refills | Status: DC

## 2019-06-28 ENCOUNTER — Encounter: Payer: Self-pay | Admitting: Neurology

## 2019-06-28 ENCOUNTER — Other Ambulatory Visit: Payer: Self-pay

## 2019-06-28 ENCOUNTER — Telehealth (INDEPENDENT_AMBULATORY_CARE_PROVIDER_SITE_OTHER): Payer: Medicare Other | Admitting: Neurology

## 2019-06-28 VITALS — Ht 69.0 in | Wt 218.0 lb

## 2019-06-28 DIAGNOSIS — G629 Polyneuropathy, unspecified: Secondary | ICD-10-CM

## 2019-06-28 DIAGNOSIS — G43109 Migraine with aura, not intractable, without status migrainosus: Secondary | ICD-10-CM | POA: Diagnosis not present

## 2019-06-28 DIAGNOSIS — M5416 Radiculopathy, lumbar region: Secondary | ICD-10-CM

## 2019-06-28 DIAGNOSIS — M5441 Lumbago with sciatica, right side: Secondary | ICD-10-CM

## 2019-06-28 DIAGNOSIS — R2689 Other abnormalities of gait and mobility: Secondary | ICD-10-CM

## 2019-06-28 DIAGNOSIS — M5442 Lumbago with sciatica, left side: Secondary | ICD-10-CM

## 2019-06-28 MED ORDER — TOPIRAMATE 50 MG PO TABS: 50.0000 mg | ORAL_TABLET | Freq: Two times a day (BID) | ORAL | 3 refills | Status: DC

## 2019-06-28 NOTE — Progress Notes (Signed)
Virtual Visit via Video Note The purpose of this virtual visit is to provide medical care while limiting exposure to the novel coronavirus.    Consent was obtained for video visit:  Yes.   Answered questions that patient had about telehealth interaction:  Yes.   I discussed the limitations, risks, security and privacy concerns of performing an evaluation and management service by telemedicine. I also discussed with the patient that there may be a patient responsible charge related to this service. The patient expressed understanding and agreed to proceed.  Pt location: Home Physician Location: office Name of referring provider:  Ann Held, * I connected with Juanda Bond Lavine at patients initiation/request on 06/28/2019 at  8:30 AM EDT by video enabled telemedicine application and verified that I am speaking with the correct person using two identifiers. Pt MRN:  [id number] Pt DOB:  1951/10/14 Video Participants:  Juanda Bond Feehan   History of Present Illness:  The patient was last seen in December 2019 for dizziness. MRI brain with and without contrast with cuts through the IACS was unremarkable. He had neuropathy labs which were normal, his EMG/NCV showed active on chronic sensorimotor polyneuropathy, predominantly axon loss in type in the right upper and both lower extremitries, severe in degree. There was note of chronic C5-6 radiculopathy in the right upper extremity, moderate in degree. A superimposed lumbosacral radiculopathy affecting L3-S1 myotomes could not be excluded. He had testing at the [place] [facility] indicating left peripheral vestibular hypofunction, as well as likely multifactorial disequilibrium, vestibular therapy and physical therapy for balance and leg strengthening exercises were recommended.  Since his last visit, he has done Balance therapy and reports that he had to stop due to the pandemic restrictions. He continues to have significant balance  issues and would like to continue with Balance therapy once able. He has been dealing with more back pain and has seen Ortho, who has told him the issue is due to his lower back and pinched nerves. He has had prior surgery which helped with the pain but did not help with his leg weakness, so he is hesitant about further surgery because he feels recovery would be slower and he does not want to compromise strength more. He has difficulty saying if weakness is worse, he does not have enough stamina and strength to do anything. He is happy to report that he has not had as many falls recently, he uses his cane regularly. The numbness and tingling are still in both feet and have not progressed. His hands feel fine, he does not have a lot of strength but does not drop things like before. He denies any further vertigo. He does not have as much migraines and continues on Topiramate 50mg  BID without side effects. He has only used Imitrex twice in the past 6 months. He has been taking Memantine for many years, he states it helps quite a bit with short-term memory issues without affecting mood like the Aricept did.   History on Initial Assessment 04/28/1018: This is a pleasant 68 year old right-handed man with a history of diabetes, hypertension, sleep apnea on CPAP, migraines, presenting for evaluation of dizziness. He reports a history of bouts of vertigo in his 68s where he would have brief episodes of feeling lightheaded. Symptoms worsened since Spring, and sensation of imbalance has been constant since then. He feels unsure when he gets up. He denies any true spinning, just unsteadiness. He has to hold on after  getting off the elevator with a sense of falling. The sensation of movement mostly occurs while standing, but has rarely occurred while just sitting down. He stumbles a lot but denies any falls. He has had vertigo with spinning sensation a couple of times a week usually with quick movements, last episode was 3-4  days ago. He reports his eyes don't focus on the same point, he had left eye muscle surgery years ago, it works 50% of the time. He has had neuropathy with numbness and tingling in both feet for 10 years, no pain. Hands are unaffected. He has neck and back pain (s/p fusion). No bowel/bladder dysfunction. He has also noticed worsening of migraines since the dizziness started. He has been taking Topamax for migraine prophylaxis for 15-20 years which had significantly reduced migraines except when triggered by strong smells. Since dizziness started, he has had headaches a couple of times a week on the vertex and temples lasting a couple of hours, no associated nausea/vomiting, vision changes. He does not take prn medications. One time he had a bad spell of dizziness and tried left over sumatriptan, which helped with both the dizziness and headache. No family history of similar symptoms.  He has been evaluated at the [facility], he was noted to have uncompensated mild left peripheral vestibular hypofunction, likely a vestibular neuritis. Vestibular assessment showed this occurred recently. No evidence of current BPPV on evaluation last month. His symptoms of "longstanding progressive postural and gait instability are most consistent with multifactorial disequilibrium secondary to peripheral neuropathy affecting both feet, the use of 4 more prescription medications, the use of trifocal lenses, and periodic BPPV. Although the symptoms are longstanding in nature they are likely exacerbated by the recent onset uncompensated left peripheral vestibular hypofunction."      Current Outpatient Medications on File Prior to Visit  Medication Sig Dispense Refill  . acidophilus (RISAQUAD) CAPS capsule Take 1 capsule by mouth daily.     Marland Kitchen albuterol (PROVENTIL) (5 MG/ML) 0.5% nebulizer solution Take 0.5 mLs (2.5 mg total) by nebulization every 6 (six) hours as needed for wheezing or shortness of  breath. 20 mL 5  . allopurinol (ZYLOPRIM) 100 MG tablet Take 1 tablet (100 mg total) by mouth daily. 90 tablet 3  . budesonide-formoterol (SYMBICORT) 80-4.5 MCG/ACT inhaler Inhale 2 puffs into the lungs 2 (two) times daily. 3 Inhaler 3  . celecoxib (CELEBREX) 200 MG capsule Take 1 capsule (200 mg total) by mouth 2 (two) times daily. 180 capsule 1  . cyanocobalamin 500 MCG tablet Take 500 mcg by mouth daily.      Marland Kitchen EPINEPHrine (EPIPEN) 0.3 mg/0.3 mL SOAJ injection Inject 0.3 mg into the muscle as needed for anaphylaxis.     . furosemide (LASIX) 20 MG tablet Take 1 tablet (20 mg total) by mouth daily. 90 tablet 3  . gabapentin (NEURONTIN) 100 MG capsule Take 100 mg by mouth at bedtime.     Marland Kitchen glucose blood (ONE TOUCH ULTRA TEST) test strip USE AS DIRECTED THREE TIMES DAILY.  DX CODE E11.21 300 each 3  . levocetirizine (XYZAL) 5 MG tablet Take 1 tablet (5 mg total) by mouth every evening. 90 tablet 1  . magnesium oxide (MAG-OX) 400 (241.3 Mg) MG tablet Take 1 tablet (400 mg total) by mouth daily. 30 tablet 1  . meclizine (ANTIVERT) 25 MG tablet Take 1 tablet (25 mg total) by mouth 3 (three) times daily as needed for dizziness. 30 tablet 0  .  memantine (NAMENDA) 10 MG tablet Take 2 tablets (20 mg total) by mouth daily. 180 tablet 3  . metFORMIN (GLUCOPHAGE) 500 MG tablet TAKE ONE TABLET BY MOUTH TWICE A DAY WITH MEAL(S) 180 tablet 1  . metoprolol tartrate (LOPRESSOR) 25 MG tablet Take 1 tablet (25 mg total) by mouth 2 (two) times daily. 180 tablet 3  . mometasone (NASONEX) 50 MCG/ACT nasal spray Place 2 sprays into the nose daily. 51 g 3  . Multiple Vitamins-Minerals (ONE-A-DAY WEIGHT SMART ADVANCE PO) Take 1 tablet by mouth daily.     . pantoprazole (PROTONIX) 40 MG tablet Take 1 tablet (40 mg total) by mouth 2 (two) times daily before a meal. 180 tablet 3  . potassium chloride SA (K-DUR) 20 MEQ tablet Take 2 tablets (40 mEq total) by mouth daily. 180 tablet 3  . pravastatin (PRAVACHOL) 10 MG tablet  Take 1 tablet (10 mg total) by mouth daily. 90 tablet 3  . PROAIR HFA 108 (90 Base) MCG/ACT inhaler Inhale 1 puff into the lungs every 6 (six) hours as needed for wheezing or shortness of breath.     . rivaroxaban (XARELTO) 20 MG TABS tablet Take 1 tablet (20 mg total) by mouth daily with supper. 90 tablet 1  . SUMAtriptan (IMITREX) 50 MG tablet Take 1 tablet as needed for migraine/vertigo. Do not take more than 3 a week 10 tablet 6  . topiramate (TOPAMAX) 50 MG tablet Take 1 tablet (50 mg total) by mouth 2 (two) times daily. 180 tablet 3  . verapamil (CALAN-SR) 240 MG CR tablet Take 1 tablet (240 mg total) by mouth at bedtime. 90 tablet 1   No current facility-administered medications on file prior to visit.      Observations/Objective:   Vitals:   06/28/19 0758  Weight: 218 lb (98.9 kg)  Height: 5\' 9"  (1.753 m)   GEN:  The patient appears stated age and is in NAD. Wearing a back brace.  Neurological examination: Patient is alert and oriented to person, place, and time. No aphasia or dysarthria. Fund of knowledge is appropriate.  Recent and remote memory are intact.  Attention and concentration are normal.    Able to name objects and repeat phrases. Cranial nerves: Pupils equal, round, reactive to light. Extraocular movements intact with note of dysconjugate gaze with left eye on leftward gaze (chronic), no nystagmus. No facial asymmetry. Moves all extremities symmetrically, at least anti-gravity x 4. Gait slightly wide-based, unable to tandem walk. Significant sway with Romberg test.    Assessment and Plan:   This is a pleasant 68 yo RH man with a history of  diabetes, hypertension, sleep apnea on CPAP, migraines, who presented for evaluation of dizziness. No further vertigo, migraines controlled on Topiramate 50mg  BID, prn Imitrex. He continues to have a constant sense of imbalance due to sensory ataxia from significant neuropathy. MRI brain, EMG/NCV confirmed severe neuropathy. It was also  noted that a superimposed lumbosacral radiculopathy affecting L3-S1 myotomes could not be excluded. He continues to report leg weakness and now more back pain, repeat EMG/NCV of both LE will be ordered to assess for interval change in radiculopathy, he will speak to Ortho/Neurosurg if there is further progression. Continue supportive care with balance therapy, HEP, and using assistive devices for balance. He will follow-up in 1 year and knows to call for any changes.  Follow Up Instructions:   -I discussed the assessment and treatment plan with the patient. The patient was provided an opportunity to ask questions  and all were answered. The patient agreed with the plan and demonstrated an understanding of the instructions.   The patient was advised to call back or seek an in-person evaluation if the symptoms worsen or if the condition fails to improve as anticipated.    Cameron Sprang, MD

## 2019-07-08 DIAGNOSIS — G4733 Obstructive sleep apnea (adult) (pediatric): Secondary | ICD-10-CM | POA: Diagnosis not present

## 2019-07-22 ENCOUNTER — Emergency Department (HOSPITAL_BASED_OUTPATIENT_CLINIC_OR_DEPARTMENT_OTHER): Payer: Medicare Other

## 2019-07-22 ENCOUNTER — Encounter (HOSPITAL_BASED_OUTPATIENT_CLINIC_OR_DEPARTMENT_OTHER): Payer: Self-pay

## 2019-07-22 ENCOUNTER — Other Ambulatory Visit: Payer: Self-pay

## 2019-07-22 ENCOUNTER — Emergency Department (HOSPITAL_BASED_OUTPATIENT_CLINIC_OR_DEPARTMENT_OTHER)
Admission: EM | Admit: 2019-07-22 | Discharge: 2019-07-22 | Disposition: A | Discharge status: Home / Self Care | Payer: Medicare Other | Attending: Emergency Medicine | Admitting: Emergency Medicine

## 2019-07-22 DIAGNOSIS — I5032 Chronic diastolic (congestive) heart failure: Secondary | ICD-10-CM | POA: Diagnosis not present

## 2019-07-22 DIAGNOSIS — Z23 Encounter for immunization: Secondary | ICD-10-CM | POA: Insufficient documentation

## 2019-07-22 DIAGNOSIS — Y929 Unspecified place or not applicable: Secondary | ICD-10-CM | POA: Insufficient documentation

## 2019-07-22 DIAGNOSIS — S6991XA Unspecified injury of right wrist, hand and finger(s), initial encounter: Secondary | ICD-10-CM | POA: Diagnosis present

## 2019-07-22 DIAGNOSIS — F17290 Nicotine dependence, other tobacco product, uncomplicated: Secondary | ICD-10-CM | POA: Insufficient documentation

## 2019-07-22 DIAGNOSIS — Z85828 Personal history of other malignant neoplasm of skin: Secondary | ICD-10-CM | POA: Insufficient documentation

## 2019-07-22 DIAGNOSIS — Y9389 Activity, other specified: Secondary | ICD-10-CM | POA: Diagnosis not present

## 2019-07-22 DIAGNOSIS — S61411A Laceration without foreign body of right hand, initial encounter: Secondary | ICD-10-CM | POA: Diagnosis not present

## 2019-07-22 DIAGNOSIS — I110 Hypertensive heart disease with heart failure: Secondary | ICD-10-CM | POA: Insufficient documentation

## 2019-07-22 DIAGNOSIS — Z79899 Other long term (current) drug therapy: Secondary | ICD-10-CM | POA: Insufficient documentation

## 2019-07-22 DIAGNOSIS — E119 Type 2 diabetes mellitus without complications: Secondary | ICD-10-CM | POA: Insufficient documentation

## 2019-07-22 DIAGNOSIS — W268XXA Contact with other sharp object(s), not elsewhere classified, initial encounter: Secondary | ICD-10-CM | POA: Diagnosis not present

## 2019-07-22 DIAGNOSIS — Z7984 Long term (current) use of oral hypoglycemic drugs: Secondary | ICD-10-CM | POA: Diagnosis not present

## 2019-07-22 DIAGNOSIS — Y998 Other external cause status: Secondary | ICD-10-CM | POA: Diagnosis not present

## 2019-07-22 DIAGNOSIS — Z7901 Long term (current) use of anticoagulants: Secondary | ICD-10-CM | POA: Insufficient documentation

## 2019-07-22 DIAGNOSIS — M79641 Pain in right hand: Secondary | ICD-10-CM | POA: Diagnosis not present

## 2019-07-22 MED ORDER — TETANUS-DIPHTH-ACELL PERTUSSIS 5-2.5-18.5 LF-MCG/0.5 IM SUSP
INTRAMUSCULAR | Status: AC
  Administered 2019-07-22: 1 mL via INTRAMUSCULAR
  Filled 2019-07-22: qty 0.5

## 2019-07-22 MED ORDER — LIDOCAINE HCL 2 % IJ SOLN
INTRAMUSCULAR | Status: AC
  Administered 2019-07-22: 400 mg
  Filled 2019-07-22: qty 20

## 2019-07-22 NOTE — Discharge Instructions (Signed)
Please keep the hand clean.  Keep it bandaged for the first 24 hours, then you can remove the bandage and wash the area with soap and water every day, bandage in between.  Please call your to schedule a follow-up in 7 to 10 days for suture removal.  If you start develop fever the meantime or if it starts to look infected please call your PCP to schedule an appointment sooner.
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
## 2019-07-22 NOTE — ED Triage Notes (Signed)
Pt states he cut right hand on fan blades ~2hours PTA-lacs noted to back of right hand-4x4/gauze applied-bleeding controlled-NAD-steady gait

## 2019-07-23 DIAGNOSIS — J189 Pneumonia, unspecified organism: Secondary | ICD-10-CM | POA: Diagnosis not present

## 2019-07-23 DIAGNOSIS — G4733 Obstructive sleep apnea (adult) (pediatric): Secondary | ICD-10-CM | POA: Diagnosis not present

## 2019-07-29 ENCOUNTER — Ambulatory Visit (INDEPENDENT_AMBULATORY_CARE_PROVIDER_SITE_OTHER): Payer: Medicare Other | Admitting: Neurology

## 2019-07-29 ENCOUNTER — Other Ambulatory Visit: Payer: Self-pay

## 2019-07-29 DIAGNOSIS — G629 Polyneuropathy, unspecified: Secondary | ICD-10-CM

## 2019-07-29 DIAGNOSIS — M5416 Radiculopathy, lumbar region: Secondary | ICD-10-CM

## 2019-07-29 DIAGNOSIS — M5441 Lumbago with sciatica, right side: Secondary | ICD-10-CM

## 2019-07-29 DIAGNOSIS — M5442 Lumbago with sciatica, left side: Secondary | ICD-10-CM

## 2019-07-29 DIAGNOSIS — R2689 Other abnormalities of gait and mobility: Secondary | ICD-10-CM

## 2019-07-29 NOTE — Procedures (Signed)
[facility]  [address]  [place], [place] [address] Tel: [phone] Fax:  [phone] Test Date:  07/29/2019  Patient: Garrett [name] DOB: [birth year] Physician: Narda Amber, DO  Sex: Male Height: 5\' 9"  Ref Phys: Ellouise Newer, MD  ID#: [id number] Temp: 32.0C Technician:    Patient Complaints: This is a 68 year old man with neuropathy and lumbar canal stenosis referred for evaluation of worsening bilateral leg weakness.  NCV & EMG Findings: Extensive electrodiagnostic testing of the right lower extremity and additional studies of the left shows:  1. Bilateral sural and superficial peroneal sensory responses are absent. 2. Bilateral peroneal and tibial motor responses are absent.  Bilateral peroneal motor responses at the tibialis anterior show reduced amplitude (R1.4, L2.3 mV). 3. Tibial H reflex is absent on the right and prolonged on the left. 4. Severe chronic motor axonal loss changes are seen affecting all of them tested muscles in the lower extremities, with fibrillation potentials involing the muscles below the knee and left rectus femoris muscle.  Proximal and deep muscles were not tested as the patient is on anticoagulation therapy.  Impression: 1. 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 2. A superimposed multilevel intraspinal canal lesion process (i.e. radiculopathy) affecting the L4-S1 myotomes bilaterally is also likely.  Correlate clinically.  3. Overall, these findings have progressed when compared to prior study dated May 05, 2018.   ___________________________ Narda Amber, DO    Nerve Conduction Studies Anti Sensory Summary Table   Site NR Peak (ms) Norm Peak (ms) P-T Amp (V) Norm P-T Amp  Left Sup Peroneal Anti Sensory (Ant Lat Mall)  12 cm NR  <4.6  >3  Right Sup Peroneal Anti Sensory (Ant Lat [place])  12 cm NR  <4.6  >3  Left  Sural Anti Sensory (Lat [place])  Calf NR  <4.6  >3  Right Sural Anti Sensory (Lat [place])  Calf NR  <4.6  >3   Motor Summary Table   Site NR Onset (ms) Norm Onset (ms) O-P Amp (mV) Norm O-P Amp Site1 Site2 Delta-0 (ms) Dist (cm) Vel (m/s) Norm Vel (m/s)  Left Peroneal Motor (Ext Dig Brev)  Ankle NR  <6.0  >2.5 B Fib Ankle  0.0  >40  B Fib NR     Poplt B Fib  0.0  >40  Poplt NR            Right Peroneal Motor (Ext Dig Brev)  Ankle NR  <6.0  >2.5 B Fib Ankle  0.0  >40  B Fib NR     Poplt B Fib  0.0  >40  Poplt NR            Left Peroneal TA Motor (Tib Ant)  Fib Head    3.8 <4.5 2.3 >3 Poplit Fib Head 1.1 7.0 64 >40  Poplit    4.9  2.3         Right Peroneal TA Motor (Tib Ant)  Fib Head    3.6 <4.5 1.4 >3 Poplit Fib Head 1.5 8.0 53 >40  Poplit    5.1  1.3         Left Tibial Motor ([place]d Hall Brev)  Ankle NR  <6.0  >4 Knee Ankle  0.0  >40  Knee NR            Right Tibial Motor (Abd Hall Brev)  Ankle NR  <6.0  >4 Knee Ankle  0.0  >40  Knee NR             H Reflex Studies   NR H-Lat (ms) Lat Norm (ms) L-R H-Lat (ms)  Left Tibial (Gastroc)     37.41 <35   Right Tibial (Gastroc)  NR  <35    EMG   Side Muscle Ins Act Fibs Psw Fasc Number Recrt Dur Dur. Amp Amp. Poly Poly. Comment  Right AntTibialis Nml 1+ Nml Nml SMU Rapid All 1+ All 1+ All 1+ ATR  Right Gastroc Nml 1+ Nml Nml SMU Rapid All 1+ All 1+ All 1+ N/A  Right RectFemoris Nml Nml Nml Nml 3- Rapid All 1+ All 1+ All 1+ N/A  Right BicepsFemS Nml Nml Nml Nml 3- Rapid All 1+ All 1+ All 1+ N/A  Left AntTibialis Nml 1+ Nml Nml SMU Rapid All 1+ All 1+ All 1+ N/A  Left BicepsFemS Nml Nml Nml Nml 3- Rapid All 1+ All 1+ All 1+ N/A  Left Gastroc Nml 1+ Nml Nml SMU Rapid All 1+ All 1+ All 1+ N/A  Left RectFemoris Nml 1+ Nml Nml 3- Rapid All 1+ All 1+ All 1+ N/A      Waveforms:

## 2019-07-30 ENCOUNTER — Other Ambulatory Visit: Payer: Self-pay

## 2019-07-30 ENCOUNTER — Telehealth: Payer: Self-pay

## 2019-07-30 NOTE — Telephone Encounter (Signed)
Pt informed of results. He would like to proceed to a Neurosurgeon. Pt has seen Dr Trenton Gammon at [facility] in the past.  Dr. Delice Lesch,  Are you ok with the pt being referred there?

## 2019-07-30 NOTE — Telephone Encounter (Signed)
-----   Message from Cameron Sprang, MD sent at 07/30/2019  3:31 PM EDT ----- Pls let him know that the nerve test showed worsening of both the neuropathy and pinched nerve in his back. Would proceed with seeing his surgeon again to discuss options, thanks

## 2019-07-30 NOTE — Telephone Encounter (Signed)
That is fine, thanks 

## 2019-08-02 ENCOUNTER — Other Ambulatory Visit: Payer: Self-pay

## 2019-08-02 ENCOUNTER — Ambulatory Visit (INDEPENDENT_AMBULATORY_CARE_PROVIDER_SITE_OTHER): Payer: Medicare Other | Admitting: Family Medicine

## 2019-08-02 ENCOUNTER — Encounter: Payer: Self-pay | Admitting: Family Medicine

## 2019-08-02 DIAGNOSIS — S61411A Laceration without foreign body of right hand, initial encounter: Secondary | ICD-10-CM | POA: Diagnosis not present

## 2019-08-02 DIAGNOSIS — Z4802 Encounter for removal of sutures: Secondary | ICD-10-CM | POA: Diagnosis not present
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
## 2019-08-02 NOTE — Assessment & Plan Note (Signed)
Sutures removed with no complications  Bandage replaced F/u prn  Er note reviewed

## 2019-08-02 NOTE — Patient Instructions (Signed)
Suture Removal, Care After This sheet gives you information about how to care for yourself after your procedure. Your health care provider may also give you more specific instructions. If you have problems or questions, contact your health care provider. What can I expect after the procedure? After your stitches (sutures) are removed, it is common to have:  Some discomfort and swelling in the area.  Slight redness in the area. Follow these instructions at home: If you have a bandage:  Wash your hands with soap and water before you change your bandage (dressing). If soap and water are not available, use hand sanitizer.  Change your dressing as told by your health care provider. If your dressing becomes wet or dirty, or develops a bad smell, change it as soon as possible.  If your dressing sticks to your skin, soak it in warm water to loosen it. Wound care   Check your wound every day for signs of infection. Check for: ? More redness, swelling, or pain. ? Fluid or blood. ? Warmth. ? Pus or a bad smell.  Wash your hands with soap and water before and after touching your wound.  Apply cream or ointment only as directed by your health care provider. If you are using cream or ointment, wash the area with soap and water 2 times a day to remove all the cream or ointment. Rinse off the soap and pat the area dry with a clean towel.  If you have skin glue or adhesive strips on your wound, leave these closures in place. They may need to stay in place for 2 weeks or longer. If adhesive strip edges start to loosen and curl up, you may trim the loose edges. Do not remove adhesive strips completely unless your health care provider tells you to do that.  Keep the wound area dry and clean. Do not take baths, swim, or use a hot tub until your health care provider approves.  Continue to protect the wound from injury.  Do not pick at your wound. Picking can cause an infection.  When your wound has  completely healed, wear sunscreen over it or cover it with clothing when you are outside. New scars get sunburned easily, which can make scarring worse. General instructions  Take over-the-counter and prescription medicines only as told by your health care provider.  Keep all follow-up visits as told by your health care provider. This is important. Contact a health care provider if:  You have redness, swelling, or pain around your wound.  You have fluid or blood coming from your wound.  Your wound feels warm to the touch.  You have pus or a bad smell coming from your wound.  Your wound opens up. Get help right away if:  You have a fever.  You have redness that is spreading from your wound. Summary  After your sutures are removed, it is common to have some discomfort and swelling in the area.  Wash your hands with soap and water before you change your bandage (dressing).  Keep the wound area dry and clean. Do not take baths, swim, or use a hot tub until your health care provider approves. This information is not intended to replace advice given to you by your health care provider. Make sure you discuss any questions you have with your health care provider. Document Released: 09/03/2001 Document Revised: 11/21/2017 Document Reviewed: 01/14/2017 Elsevier Patient Education  2020 [employer]  

## 2019-08-02 NOTE — Telephone Encounter (Signed)
Referral sent to [facility], [place] location. They will schedule the appt for pt. Pt informed.

## 2019-08-04 ENCOUNTER — Other Ambulatory Visit: Payer: Self-pay | Admitting: Family Medicine

## 2019-08-04 DIAGNOSIS — I10 Essential (primary) hypertension: Secondary | ICD-10-CM

## 2019-08-05 DIAGNOSIS — M5416 Radiculopathy, lumbar region: Secondary | ICD-10-CM | POA: Diagnosis not present

## 2019-08-05 DIAGNOSIS — I10 Essential (primary) hypertension: Secondary | ICD-10-CM | POA: Diagnosis not present

## 2019-08-16 DIAGNOSIS — M5416 Radiculopathy, lumbar region: Secondary | ICD-10-CM | POA: Diagnosis not present

## 2019-08-19 DIAGNOSIS — M47816 Spondylosis without myelopathy or radiculopathy, lumbar region: Secondary | ICD-10-CM | POA: Diagnosis not present

## 2019-08-19 DIAGNOSIS — M5416 Radiculopathy, lumbar region: Secondary | ICD-10-CM | POA: Diagnosis not present

## 2019-08-19 DIAGNOSIS — I10 Essential (primary) hypertension: Secondary | ICD-10-CM | POA: Diagnosis not present

## 2019-08-19 DIAGNOSIS — M5127 Other intervertebral disc displacement, lumbosacral region: Secondary | ICD-10-CM | POA: Diagnosis not present

## 2019-08-23 DIAGNOSIS — J189 Pneumonia, unspecified organism: Secondary | ICD-10-CM | POA: Diagnosis not present

## 2019-08-23 DIAGNOSIS — G4733 Obstructive sleep apnea (adult) (pediatric): Secondary | ICD-10-CM | POA: Diagnosis not present

## 2019-09-03 ENCOUNTER — Other Ambulatory Visit: Payer: Self-pay | Admitting: Adult Health

## 2019-09-10 ENCOUNTER — Other Ambulatory Visit: Payer: Self-pay | Admitting: *Deleted

## 2019-09-10 MED ORDER — FLUTICASONE PROPIONATE 50 MCG/ACT NA SUSP: 2.0000 | Freq: Every day | NASAL | 0 refills | Status: AC

## 2019-09-13 DIAGNOSIS — I2782 Chronic pulmonary embolism: Secondary | ICD-10-CM | POA: Diagnosis not present

## 2019-09-13 DIAGNOSIS — M47817 Spondylosis without myelopathy or radiculopathy, lumbosacral region: Secondary | ICD-10-CM | POA: Diagnosis not present

## 2019-09-13 DIAGNOSIS — I10 Essential (primary) hypertension: Secondary | ICD-10-CM | POA: Diagnosis not present

## 2019-09-13 DIAGNOSIS — M4310 Spondylolisthesis, site unspecified: Secondary | ICD-10-CM | POA: Diagnosis not present

## 2019-09-19 ENCOUNTER — Other Ambulatory Visit: Payer: Self-pay

## 2019-09-19 ENCOUNTER — Emergency Department (INDEPENDENT_AMBULATORY_CARE_PROVIDER_SITE_OTHER): Payer: Medicare Other

## 2019-09-19 ENCOUNTER — Emergency Department
Admission: EM | Admit: 2019-09-19 | Discharge: 2019-09-19 | Disposition: A | Discharge status: Home / Self Care | Payer: Medicare Other | Source: Home / Self Care | Attending: Family Medicine | Admitting: Family Medicine

## 2019-09-19 DIAGNOSIS — R062 Wheezing: Secondary | ICD-10-CM | POA: Diagnosis not present

## 2019-09-19 DIAGNOSIS — R05 Cough: Secondary | ICD-10-CM | POA: Diagnosis not present

## 2019-09-19 DIAGNOSIS — J209 Acute bronchitis, unspecified: Secondary | ICD-10-CM | POA: Diagnosis not present

## 2019-09-19 MED ORDER — DOXYCYCLINE HYCLATE 100 MG PO CAPS: ORAL_CAPSULE | ORAL | 0 refills | Status: DC

## 2019-09-19 MED ORDER — PREDNISONE 20 MG PO TABS: ORAL_TABLET | ORAL | 0 refills | Status: DC

## 2019-09-19 MED ORDER — ALBUTEROL SULFATE (5 MG/ML) 0.5% IN NEBU: 2.5000 mg | INHALATION_SOLUTION | Freq: Four times a day (QID) | RESPIRATORY_TRACT | 5 refills | Status: DC | PRN

## 2019-09-19 NOTE — ED Triage Notes (Signed)
Pt c/o intermittent cough. Green/yellow mucous. Says his belt he wears for back pain he thinks Is causing reflux and it may have got into his lungs. He uses a nebulizer at home but has ran out of the solution. Feeling SOB easily.

## 2019-09-19 NOTE — Discharge Instructions (Addendum)
Take plain guaifenesin (1200mg  extended release tabs such as Mucinex) twice daily, with plenty of water, for cough and congestion.  Get adequate rest.   May take Delsym Cough Suppressant at bedtime for nighttime cough.  Stop all antihistamines for now, and other non-prescription cough/cold preparations.   If symptoms become significantly worse during the night or over the weekend, proceed to the local emergency room.
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
## 2019-09-20 ENCOUNTER — Ambulatory Visit: Payer: Self-pay | Admitting: Family Medicine

## 2019-09-20 NOTE — Telephone Encounter (Signed)
Pt left message, unable to reach. States he went to UC over weekend, 'bronchitis.'   States was given antibiotic and albuterol inhaler. Pt has appt for flu shot in AM and is questioning if he should still have injection.  Please advise: [phone]

## 2019-09-20 NOTE — Telephone Encounter (Signed)
Spoke with patient. Pt rescheduled flu shot

## 2019-09-21 ENCOUNTER — Ambulatory Visit: Payer: Medicare Other

## 2019-09-22 DIAGNOSIS — G4733 Obstructive sleep apnea (adult) (pediatric): Secondary | ICD-10-CM | POA: Diagnosis not present

## 2019-09-22 DIAGNOSIS — J189 Pneumonia, unspecified organism: Secondary | ICD-10-CM | POA: Diagnosis not present

## 2019-09-24 ENCOUNTER — Other Ambulatory Visit: Payer: Self-pay | Admitting: Family Medicine

## 2019-09-24 DIAGNOSIS — I2699 Other pulmonary embolism without acute cor pulmonale: Secondary | ICD-10-CM

## 2019-09-24 DIAGNOSIS — D500 Iron deficiency anemia secondary to blood loss (chronic): Secondary | ICD-10-CM

## 2019-10-07 ENCOUNTER — Other Ambulatory Visit: Payer: Self-pay

## 2019-10-07 ENCOUNTER — Ambulatory Visit (INDEPENDENT_AMBULATORY_CARE_PROVIDER_SITE_OTHER): Payer: Medicare Other

## 2019-10-07 DIAGNOSIS — Z23 Encounter for immunization: Secondary | ICD-10-CM

## 2019-10-07 DIAGNOSIS — G4733 Obstructive sleep apnea (adult) (pediatric): Secondary | ICD-10-CM | POA: Diagnosis not present

## 2019-10-11 ENCOUNTER — Other Ambulatory Visit: Payer: Self-pay

## 2019-10-11 DIAGNOSIS — Z20822 Contact with and (suspected) exposure to covid-19: Secondary | ICD-10-CM

## 2019-10-13 LAB — NOVEL CORONAVIRUS, NAA: SARS-CoV-2, NAA: NOT DETECTED

## 2019-10-22 ENCOUNTER — Other Ambulatory Visit: Payer: Self-pay | Admitting: Family Medicine

## 2019-10-22 DIAGNOSIS — I10 Essential (primary) hypertension: Secondary | ICD-10-CM

## 2019-10-23 DIAGNOSIS — G4733 Obstructive sleep apnea (adult) (pediatric): Secondary | ICD-10-CM | POA: Diagnosis not present

## 2019-10-23 DIAGNOSIS — J189 Pneumonia, unspecified organism: Secondary | ICD-10-CM | POA: Diagnosis not present

## 2019-11-03 DIAGNOSIS — Z6833 Body mass index (BMI) 33.0-33.9, adult: Secondary | ICD-10-CM | POA: Insufficient documentation

## 2019-11-03 DIAGNOSIS — M47816 Spondylosis without myelopathy or radiculopathy, lumbar region: Secondary | ICD-10-CM | POA: Diagnosis not present

## 2019-11-03 DIAGNOSIS — I10 Essential (primary) hypertension: Secondary | ICD-10-CM | POA: Diagnosis not present

## 2019-11-08 ENCOUNTER — Other Ambulatory Visit: Payer: Self-pay | Admitting: Family Medicine

## 2019-11-08 DIAGNOSIS — R05 Cough: Secondary | ICD-10-CM

## 2019-11-08 DIAGNOSIS — R059 Cough, unspecified: Secondary | ICD-10-CM

## 2019-11-11 DIAGNOSIS — M47816 Spondylosis without myelopathy or radiculopathy, lumbar region: Secondary | ICD-10-CM | POA: Diagnosis not present

## 2019-11-23 ENCOUNTER — Telehealth: Payer: Self-pay | Admitting: *Deleted

## 2019-11-23 NOTE — Telephone Encounter (Signed)
Copied from [place] [id number]. Topic: Quick Communication - Rx Refill/Question >> Nov 23, 2019  2:56 PM Carolyn Stare wrote: Medication albuterol (PROVENTIL) (5 MG/ML) 0.5% nebulizer solution     Pt does not want the solution he is requesting the PACKETS    Preferred Pharmacy [facility] [place]   Agent: Please be advised that RX refills may take up to 3 business days. We ask that you follow-up with your pharmacy.

## 2019-11-24 MED ORDER — ALBUTEROL SULFATE (2.5 MG/3ML) 0.083% IN NEBU: 2.5000 mg | INHALATION_SOLUTION | Freq: Four times a day (QID) | RESPIRATORY_TRACT | 1 refills | Status: DC | PRN

## 2019-11-24 NOTE — Telephone Encounter (Signed)
Refilled medication after speaking with pharmacy and patient. Pt states having a neb solution that he has to dilute. Not sure which solution he has. Pt states the solution in the office is the correct one that he wants.

## 2019-12-08 ENCOUNTER — Encounter: Payer: Self-pay | Admitting: Family Medicine

## 2019-12-08 DIAGNOSIS — H9190 Unspecified hearing loss, unspecified ear: Secondary | ICD-10-CM

## 2019-12-08 NOTE — Telephone Encounter (Signed)
Yes-- this is ok Dx hearing loss

## 2019-12-10 NOTE — Addendum Note (Signed)
Addended by: Sanda Linger on: 12/10/2019 01:11 PM   Modules accepted: Orders

## 2019-12-15 DIAGNOSIS — H903 Sensorineural hearing loss, bilateral: Secondary | ICD-10-CM | POA: Diagnosis not present

## 2019-12-28 DIAGNOSIS — M47816 Spondylosis without myelopathy or radiculopathy, lumbar region: Secondary | ICD-10-CM | POA: Diagnosis not present

## 2019-12-30 DIAGNOSIS — H903 Sensorineural hearing loss, bilateral: Secondary | ICD-10-CM | POA: Diagnosis not present

## 2020-01-07 ENCOUNTER — Ambulatory Visit (HOSPITAL_BASED_OUTPATIENT_CLINIC_OR_DEPARTMENT_OTHER)
Admission: RE | Admit: 2020-01-07 | Discharge: 2020-01-07 | Disposition: A | Discharge status: Home / Self Care | Payer: Medicare Other | Source: Ambulatory Visit | Attending: Adult Health | Admitting: Adult Health

## 2020-01-07 ENCOUNTER — Other Ambulatory Visit: Payer: Self-pay

## 2020-01-07 DIAGNOSIS — R918 Other nonspecific abnormal finding of lung field: Secondary | ICD-10-CM

## 2020-01-07 NOTE — Progress Notes (Signed)
Called spoke with patient, advised of CT Chest results / recs as stated by Rexene Edison NP.  Pt verbalized understanding and denied any questions.

## 2020-01-12 ENCOUNTER — Ambulatory Visit (INDEPENDENT_AMBULATORY_CARE_PROVIDER_SITE_OTHER): Payer: Medicare Other | Admitting: Internal Medicine

## 2020-01-12 ENCOUNTER — Other Ambulatory Visit: Payer: Self-pay

## 2020-01-12 ENCOUNTER — Encounter: Payer: Self-pay | Admitting: Physical Therapy

## 2020-01-12 DIAGNOSIS — G4733 Obstructive sleep apnea (adult) (pediatric): Secondary | ICD-10-CM

## 2020-01-12 DIAGNOSIS — J45991 Cough variant asthma: Secondary | ICD-10-CM | POA: Diagnosis not present

## 2020-01-12 DIAGNOSIS — R0600 Dyspnea, unspecified: Secondary | ICD-10-CM

## 2020-01-12 DIAGNOSIS — R0609 Other forms of dyspnea: Secondary | ICD-10-CM

## 2020-01-12 NOTE — Patient Instructions (Addendum)
Try albuterol 15 min before an activity that you know would make you short of breath and see if it makes any difference and if makes none then don't take it after activity unless you can't catch your breath.      No change in medications - refills can be thru your PCP or you can make an appt to see one of our NP's but you do need to establish with one of our sleep doctors before the end of the year (no rush).  Pulmonary follow up in my clinic is as needed - good luck!

## 2020-01-12 NOTE — Progress Notes (Signed)
Subjective:    Patient ID: Garrett [name], male    DOB: [birth year],   MRN: [id number]   Brief patient profile:  31 yowm quit smoking 1992 and exposed to dust in [place] until age of 25  no respiratory problems except for OSA until fall 2016 with recurrent coughing/ sob/ wheezing since then already rx with Symbicort/ saba so referred to pulmonary clinic 03/21/2016 by Dr Etter Sjogren with abn ct chest.   History of Present Illness  03/21/2016 1st [facility] office visit/ Garrett [name]  On maint symbicort 160  Chief Complaint  Patient presents with  . Pulmonary Consult    Referred by Dr. Etter Sjogren. Pt states he had PNA recently and has had cough and SOB for the past few months- esp worse over the past 6 wks. He gets SOB walking short distances such as to his mailbox. His cough is prod at times with yellow/green sputum.    prior to 6 m prior to OV  Back and forth mb fine/ no need for any inhalers but breathing never right since, best rx is prednisone no def resp to symbicort or saba  rec Stop Altace and start avapro  150 mg  One half daily - take a whole if blood pressure too high Pantoprazole Take 30- 60 min before your first and last meals of the day  GERD diet  If get worse > Prednisone 10 mg take  4 each am x 2 days,   2 each am x 2 days,  1 each am x 2 days and stop     05/02/2016  f/u ov/Garrett [name] re:  prob acei uacs / mpns  Chief Complaint  Patient presents with  . Follow-up    Breathing is much improved- almost back to his normal baseline. He is no longer coughing. Has not had to use albuterol.    improved p 2 weeks off acei and now Not limited by breathing from desired activities   rec We will call you for follow up ct in Sept 2017> no change in old nodules, one new only 6 mm > rec 12 m f/u     02/03/2017  Acute extended  ov/Garrett [name] re: new sob  Chief Complaint  Patient presents with  . Follow-up    upper airway cough syndrome, not having much cough but still struggles with dyspnea with exertion    acute ill on a 01/17/17  with fever chills and sob and minimal cough dry > UC same Sunday 01/19/17 Rocephin /levaquin 500 x 10 days and pred rec  Then doxy still has one day left but breathing but sob x 50 fts  Baseline = slower than nl = MMRC2 = can't walk a nl pace on a flat grade s sob but does fine slow and flat eg walking at mall  Not on any kind of regular use inhaler - symbicort listed but not taking Now on neb and it too does not really help his sob Taking ppi but not ac       No noct symptoms at all  rec Protonix Take 30- 60 min before your first and last meals of the day  GERD diet  CTa 02/04/17 > pos PE > xarelto     02/10/2017  Post hosp f/u ov/transition of care / Garrett [name] re: doe / dx of PE/ not using symb very rare need for saba/ never neb  Chief Complaint  Patient presents with  . Follow-up    F/u for CT results. Breathing has  been getting better since last OV.   Not limited by breathing from desired activities  But Relatively sedentary. rec Only use your albuterol as a rescue medication   - Ok to use up to 2 puffs  every 4 hours  But if you start needing it for any reason more than you do now then you need to restart regular use of symbicort 80 Take 2 puffs first thing in am and then another 2 puffs about 12 hours later.      03/07/17 NP rec Continue using your Albuterol Nebs and rescue inhaler as needed for shortness of breath or wheezing. Continue on CPAP at bedtime. You appear to be benefiting from the treatment Goal is to wear for at least 4-6 hours each night for maximal clinical benefit. Continue to work on weight loss, as the link between excess weight  and sleep apnea is well established.  Do not drive if sleepy. Follow up with Dr. Melvyn Novas In 3  Months. or before as needed.  Keep August Appointment ( This appointment is for lung nodules) Continue taking Xarelto 20 mg daily.    06/17/2017  f/u ov/Garrett [name] re: unexplained sob s/p remote pe maint on xarelto / on  multiple inhalers with poor insight  Chief Complaint  Patient presents with  . Follow-up    Pt states he had a bad wk last wk with his breathing- relates to humid weather. He states he was using atrovent and proair both every day. He uses neb once per wk on average. He has occ dry cough.   woke up nl hour with cough and sob rx  Variably sob with activity, "atrovent works the best"  Worse sob/ cough out in heat typically sleeps fine but freq am flares of cough/sob  rec Plan A = Automatic = symbicort 80 Take 2 puffs first thing in am and then another 2 puffs about 12 hours later.  Work on inhaler technique:  Plan B = Backup Only use your albuterol (PROAIR)as a rescue medication  Plan C = Crisis - only use your albuterol nebulizer if you first try Plan B and it fails to help > ok to use the nebulizer up to every 4 hours but if start needing it regularly call for immediate appointment Continue protonix (pantoprazole) 40 mg Take 30- 60 min before your first and last meals of the day and add Pepcid ac 20 mg at bedtime     12/12/2017  f/u ov/Garrett [name] re: cough variant asthma vs uacs  Chief Complaint  Patient presents with  . Follow-up    Pt states he has been doing pretty well since last visit. Does have complaints of occ. cough, SOB with exertion. Denies any CP.  sleeping fine minimal elevation / on cpap  Per [facility] / [facility]  More limited by back than breathing at this point / min daytime cough rec Plan A = Automatic =  Continue symbicort 80 Take 2 puffs first thing in am and then another 2 puffs about 12 hours later.   Plan B = Backup Only use your albuterol (PROAIR)as a rescue medication Plan C = Crisis - only use your albuterol nebulizer if you first try Plan B  Continue protonix (pantoprazole) 40 mg Take 30- 60 min before your first and last meals of the day and add Pepcid ac 20 mg at bedtime  We will set you up for follow up by Sleep medicine since Dr Gwenette Greet is not availabe > 6 months is  fine  Virtual Visit via Telephone Note 01/12/2020   I connected with Garrett [name] on 01/12/20 at  4:30 PM EST by telephone and verified that I am speaking with the correct person using two identifiers.   I discussed the limitations, risks, security and privacy concerns of performing an evaluation and management service by telephone and the availability of in person appointments. I also discussed with the patient that there may be a patient responsible charge related to this service. The patient expressed understanding and agreed to proceed.   History of Present Illness: mild asthma on symb 80 2bid  Dyspnea:  No regular walking/ if walks uphill or carries heavy objects always gets a little winded, no cp Cough: gone Sleeping: cpap hs  SABA use: minimal, never prechallenges  02: none      No obvious day to day or daytime variability or assoc excess/ purulent sputum or mucus plugs or hemoptysis or cp or chest tightness, subjective wheeze or overt sinus or hb symptoms.    Also denies any obvious fluctuation of symptoms with weather or environmental changes or other aggravating or alleviating factors except as outlined above.   Meds reviewed/ med reconciliation completed        Observations/Objective: Sounds great / no conversational sob/ no hoarseness     Assessment and Plan: See problem list for active a/p's   Follow Up Instructions: See avs for instructions unique to this ov which includes revised/ updated med list   Outpatient Encounter Medications as of 01/12/2020  Medication Sig  . acidophilus (RISAQUAD) CAPS capsule Take 1 capsule by mouth daily.   Marland Kitchen albuterol (PROVENTIL) (2.5 MG/3ML) 0.083% nebulizer solution Take 3 mLs (2.5 mg total) by nebulization every 6 (six) hours as needed for wheezing or shortness of breath.  . allopurinol (ZYLOPRIM) 100 MG tablet Take 1 tablet (100 mg total) by mouth daily.  . budesonide-formoterol (SYMBICORT) 80-4.5 MCG/ACT inhaler Inhale  2 puffs into the lungs 2 (two) times daily.  . celecoxib (CELEBREX) 200 MG capsule TAKE 1 CAPSULE BY MOUTH  TWICE DAILY  . cyanocobalamin 500 MCG tablet Take 500 mcg by mouth daily.    Marland Kitchen doxycycline (VIBRAMYCIN) 100 MG capsule Take one tab PO Q12hr with food  . EPINEPHrine (EPIPEN) 0.3 mg/0.3 mL SOAJ injection Inject 0.3 mg into the muscle as needed for anaphylaxis.   . fluticasone (FLONASE) 50 MCG/ACT nasal spray Place 2 sprays into both nostrils daily.  . furosemide (LASIX) 20 MG tablet Take 1 tablet (20 mg total) by mouth daily.  Marland Kitchen gabapentin (NEURONTIN) 100 MG capsule Take 100 mg by mouth at bedtime.   Marland Kitchen glucose blood (ONE TOUCH ULTRA TEST) test strip USE AS DIRECTED THREE TIMES DAILY.  DX CODE E11.21  . levocetirizine (XYZAL) 5 MG tablet TAKE 1 TABLET BY MOUTH IN  THE EVENING  . magnesium oxide (MAG-OX) 400 (241.3 Mg) MG tablet Take 1 tablet (400 mg total) by mouth daily.  . meclizine (ANTIVERT) 25 MG tablet Take 1 tablet (25 mg total) by mouth 3 (three) times daily as needed for dizziness.  . memantine (NAMENDA) 10 MG tablet Take 2 tablets (20 mg total) by mouth daily.  . metFORMIN (GLUCOPHAGE) 500 MG tablet TAKE ONE TABLET BY MOUTH TWICE A DAY WITH MEAL(S)  . metoprolol tartrate (LOPRESSOR) 25 MG tablet Take 1 tablet (25 mg total) by mouth 2 (two) times daily.  . mometasone (NASONEX) 50 MCG/ACT nasal spray Place 2 sprays into the nose daily.  . Multiple Vitamins-Minerals (ONE-A-DAY WEIGHT SMART  ADVANCE PO) Take 1 tablet by mouth daily.   . pantoprazole (PROTONIX) 40 MG tablet Take 1 tablet (40 mg total) by mouth 2 (two) times daily before a meal.  . potassium chloride SA (K-DUR) 20 MEQ tablet Take 2 tablets (40 mEq total) by mouth daily.  . pravastatin (PRAVACHOL) 10 MG tablet Take 1 tablet (10 mg total) by mouth daily.  . predniSONE (DELTASONE) 20 MG tablet Take one tab by mouth twice daily for 4 days, then one daily. Take with food.  Marland Kitchen PROAIR HFA 108 (90 Base) MCG/ACT inhaler Inhale 1  puff into the lungs every 6 (six) hours as needed for wheezing or shortness of breath.   . SUMAtriptan (IMITREX) 50 MG tablet Take 1 tablet as needed for migraine/vertigo. Do not take more than 3 a week  . topiramate (TOPAMAX) 50 MG tablet Take 1 tablet (50 mg total) by mouth 2 (two) times daily.  . verapamil (CALAN-SR) 240 MG CR tablet TAKE 1 TABLET BY MOUTH AT  BEDTIME  . XARELTO 20 MG TABS tablet TAKE 1 TABLET BY MOUTH  DAILY WITH SUPPER        I discussed the assessment and treatment plan with the patient. The patient was provided an opportunity to ask questions and all were answered. The patient agreed with the plan and demonstrated an understanding of the instructions.   The patient was advised to call back or seek an in-person evaluation if the symptoms worsen or if the condition fails to improve as anticipated.  I provided 15  minutes of non-face-to-face time during this encounter.   [name], MD

## 2020-01-12 NOTE — Therapy (Signed)
[facility] [address] [place], [place], [address] Phone: [phone]   Fax:  [phone]  Patient Details  Name: Garrett [name] MRN: [id number] Date of Birth: 1951/03/04 Referring Provider:  No ref. provider found  Encounter Date: 01/12/2020  PHYSICAL THERAPY DISCHARGE SUMMARY  Visits from Start of Care: 12  Current functional level related to goals / functional outcomes: Unable to assess; pt did not return to therapy due to clinic closing due to [place] and financial concerns.  When clinic reopened pt did not return to therapy.   Remaining deficits: Impaired balance   Education / Equipment: HEP  Plan: Patient agrees to discharge.  Patient goals were not met. Patient is being discharged due to not returning since the last visit.  ?????     Rico Junker, PT, DPT 01/12/20    4:00 PM    [facility] [address] [place], [place], [address] Phone: [phone]   Fax:  [phone]

## 2020-01-13 ENCOUNTER — Encounter: Payer: Self-pay | Admitting: Internal Medicine

## 2020-01-13 DIAGNOSIS — G4733 Obstructive sleep apnea (adult) (pediatric): Secondary | ICD-10-CM | POA: Diagnosis not present

## 2020-01-13 NOTE — Assessment & Plan Note (Signed)
02/03/2017  Walked RA x 3 laps @ 185 ft each stopped due to  End of study, nl pace,  Min sob/no desat   - legs weak  - Spirometry 02/03/2017  3 h p saba  wnl including curvature of f/v loop   Clearly conditioning more likely than asthma as cause of his doe > see asthma rec for rx with saba prior to ex/ f/u prn

## 2020-01-13 NOTE — Assessment & Plan Note (Signed)
All goals of chronic asthma control met including optimal function and elimination of symptoms with minimal need for rescue therapy.  Contingencies discussed in full including contacting this office immediately if not controlling the symptoms using the rule of two's.    Reviewed approp use of saba  I spent extra time with pt today reviewing appropriate use of albuterol for prn use on exertion with the following points: 1) saba is for relief of sob that does not improve by walking a slower pace or resting but rather if the pt does not improve after trying this first. 2) If the pt is convinced, as many are, that saba helps recover from activity faster then it's easy to tell if this is the case by re-challenging : ie stop, take the inhaler, then p 5 minutes try the exact same activity (intensity of workload) that just caused the symptoms and see if they are substantially diminished or not after saba 3) if there is an activity that reproducibly causes the symptoms, try the saba 15 min before the activity on alternate days   If in fact the saba really does help, then fine to continue to use it prn but advised may need to look closer at the maintenance regimen being used to achieve better control of airways disease with exertion.    F/u can be prn if establishes with one of our sleep docs

## 2020-01-13 NOTE — Assessment & Plan Note (Signed)
HST 12/2013:  AHI 6/hr.  On auto 5-20cm   rec f/u sleep medicine by the end of the year  Each maintenance medication was reviewed in detail including most importantly the difference between maintenance and as needed and under what circumstances the prns are to be used.  Please see AVS for specific  Instructions which are unique to this visit and I personally typed out  which were reviewed in detail over the phone with the patient and a copy provided via MyChart

## 2020-01-18 ENCOUNTER — Emergency Department
Admission: EM | Admit: 2020-01-18 | Discharge: 2020-01-18 | Disposition: A | Discharge status: Home / Self Care | Payer: Medicare Other | Source: Home / Self Care | Attending: Family Medicine | Admitting: Family Medicine

## 2020-01-18 ENCOUNTER — Other Ambulatory Visit: Payer: Self-pay

## 2020-01-18 DIAGNOSIS — J069 Acute upper respiratory infection, unspecified: Secondary | ICD-10-CM

## 2020-01-18 DIAGNOSIS — R059 Cough, unspecified: Secondary | ICD-10-CM

## 2020-01-18 DIAGNOSIS — R05 Cough: Secondary | ICD-10-CM | POA: Diagnosis not present

## 2020-01-18 MED ORDER — DOXYCYCLINE HYCLATE 100 MG PO CAPS: ORAL_CAPSULE | ORAL | 0 refills | Status: DC

## 2020-01-18 MED ORDER — PREDNISONE 20 MG PO TABS: ORAL_TABLET | ORAL | 0 refills | Status: DC
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
## 2020-01-18 NOTE — Discharge Instructions (Addendum)
Take plain guaifenesin (1200mg  extended release tabs such as Mucinex) twice daily, with plenty of water, for cough and congestion.  Get adequate rest.   May take Delsym Cough Suppressant at bedtime for nighttime cough.  Try warm salt water gargles for sore throat.  Stop all antihistamines for now, and other non-prescription cough/cold preparations. Continue all inhalers as prescribed.  Isolate yourself until COVID-19 test result is available.   If your COVID19 test is positive, then you are infected with the novel coronavirus and could give the virus to others.  Please continue isolation at home for at least 10 days since the start of your symptoms.  Once you complete your 10 day quarantine, you may return to normal activities as long as you've not had a fever for over 24 hours (without taking fever reducing medicine) and your symptoms are improving. Please continue good preventive care measures, including:  frequent hand-washing, avoid touching your face, cover coughs/sneezes, stay out of crowds and keep a 6 foot distance from others.  Go to the nearest hospital emergency room if fever/cough/breathlessness are severe or illness seems like a threat to life.

## 2020-01-18 NOTE — ED Triage Notes (Signed)
Pt has head congestion, coughing up phlegm, yellowish in color.  Chest congestion.  Has been going on since Sunday.  Has been using NEB

## 2020-01-20 LAB — NOVEL CORONAVIRUS, NAA: SARS-CoV-2, NAA: NOT DETECTED
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
## 2020-02-03 ENCOUNTER — Ambulatory Visit (INDEPENDENT_AMBULATORY_CARE_PROVIDER_SITE_OTHER): Payer: Medicare Other | Admitting: *Deleted

## 2020-02-03 ENCOUNTER — Encounter: Payer: Self-pay | Admitting: *Deleted

## 2020-02-03 ENCOUNTER — Other Ambulatory Visit: Payer: Self-pay

## 2020-02-03 VITALS — BP 135/90

## 2020-02-03 DIAGNOSIS — Z0000 Encounter for general adult medical examination without abnormal findings: Secondary | ICD-10-CM

## 2020-02-03 NOTE — Patient Instructions (Signed)
Please schedule your next medicare wellness visit with me in 1 yr.  Continue to eat heart healthy diet (full of fruits, vegetables, whole grains, lean protein, water--limit salt, fat, and sugar intake) and increase physical activity as tolerated.  Continue doing brain stimulating activities (puzzles, reading, adult coloring books, staying active) to keep memory sharp.   Bring a copy of your living will and/or healthcare power of attorney to your next office visit.   Garrett [name] , Thank you for taking time to come for your Medicare Wellness Visit. I appreciate your ongoing commitment to your health goals. Please review the following plan we discussed and let me know if I can assist you in the future.   These are the goals we discussed: Goals    . DIET - EAT MORE FRUITS AND VEGETABLES    . Increase physical activity       This is a list of the screening recommended for you and due dates:  Health Maintenance  Topic Date Due  . Complete foot exam   04/24/2018  . Hemoglobin A1C  10/09/2019  . Eye exam for diabetics  12/22/2019  . Urine Protein Check  04/08/2020  . Colon Cancer Screening  09/25/2020  . Tetanus Vaccine  07/21/2029  . Flu Shot  Completed  .  Hepatitis C: One time screening is recommended by Center for Disease Control  (CDC) for  adults born from [birth year] through 1966.   Completed  . Pneumonia vaccines  Completed    Preventive Care 20 Years and Older, Male Preventive care refers to lifestyle choices and visits with your health care provider that can promote health and wellness. This includes:  A yearly physical exam. This is also called an annual well check.  Regular dental and eye exams.  Immunizations.  Screening for certain conditions.  Healthy lifestyle choices, such as diet and exercise. What can I expect for my preventive care visit? Physical exam Your health care provider will check:  Height and weight. These may be used to calculate body mass index (BMI),  which is a measurement that tells if you are at a healthy weight.  Heart rate and blood pressure.  Your skin for abnormal spots. Counseling Your health care provider may ask you questions about:  Alcohol, tobacco, and drug use.  Emotional well-being.  Home and relationship well-being.  Sexual activity.  Eating habits.  History of falls.  Memory and ability to understand (cognition).  Work and work Statistician. What immunizations do I need?  Influenza (flu) vaccine  This is recommended every year. Tetanus, diphtheria, and pertussis (Tdap) vaccine  You may need a Td booster every 10 years. Varicella (chickenpox) vaccine  You may need this vaccine if you have not already been vaccinated. Zoster (shingles) vaccine  You may need this after age 58. Pneumococcal conjugate (PCV13) vaccine  One dose is recommended after age 75. Pneumococcal polysaccharide (PPSV23) vaccine  One dose is recommended after age 70. Measles, mumps, and rubella (MMR) vaccine  You may need at least one dose of MMR if you were born in 1957 or later. You may also need a second dose. Meningococcal conjugate (MenACWY) vaccine  You may need this if you have certain conditions. Hepatitis A vaccine  You may need this if you have certain conditions or if you travel or work in places where you may be exposed to hepatitis A. Hepatitis B vaccine  You may need this if you have certain conditions or if you travel or work in  places where you may be exposed to hepatitis B. Haemophilus influenzae type b (Hib) vaccine  You may need this if you have certain conditions. You may receive vaccines as individual doses or as more than one vaccine together in one shot (combination vaccines). Talk with your health care provider about the risks and benefits of combination vaccines. What tests do I need? Blood tests  Lipid and cholesterol levels. These may be checked every 5 years, or more frequently depending on  your overall health.  Hepatitis C test.  Hepatitis B test. Screening  Lung cancer screening. You may have this screening every year starting at age 36 if you have a 30-pack-year history of smoking and currently smoke or have quit within the past 15 years.  Colorectal cancer screening. All adults should have this screening starting at age 54 and continuing until age 70. Your health care provider may recommend screening at age 43 if you are at increased risk. You will have tests every 1-10 years, depending on your results and the type of screening test.  Prostate cancer screening. Recommendations will vary depending on your family history and other risks.  Diabetes screening. This is done by checking your blood sugar (glucose) after you have not eaten for a while (fasting). You may have this done every 1-3 years.  Abdominal aortic aneurysm (AAA) screening. You may need this if you are a current or former smoker.  Sexually transmitted disease (STD) testing. Follow these instructions at home: Eating and drinking  Eat a diet that includes fresh fruits and vegetables, whole grains, lean protein, and low-fat dairy products. Limit your intake of foods with high amounts of sugar, saturated fats, and salt.  Take vitamin and mineral supplements as recommended by your health care provider.  Do not drink alcohol if your health care provider tells you not to drink.  If you drink alcohol: ? Limit how much you have to 0-2 drinks a day. ? Be aware of how much alcohol is in your drink. In the [place] one drink equals one 12 oz bottle of beer (355 mL), one 5 oz glass of wine (148 mL), or one 1 oz glass of hard liquor (44 mL). Lifestyle  Take daily care of your teeth and gums.  Stay active. Exercise for at least 30 minutes on 5 or more days each week.  Do not use any products that contain nicotine or tobacco, such as cigarettes, e-cigarettes, and chewing tobacco. If you need help quitting, ask your  health care provider.  If you are sexually active, practice safe sex. Use a condom or other form of protection to prevent STIs (sexually transmitted infections).  Talk with your health care provider about taking a low-dose aspirin or statin. What's next?  Visit your health care provider once a year for a well check visit.  Ask your health care provider how often you should have your eyes and teeth checked.  Stay up to date on all vaccines. This information is not intended to replace advice given to you by your health care provider. Make sure you discuss any questions you have with your health care provider. Document Revised: 12/03/2018 Document Reviewed: 12/03/2018 Elsevier Patient Education  2020 [employer].

## 2020-02-12 ENCOUNTER — Ambulatory Visit: Payer: Medicare Other | Attending: Internal Medicine

## 2020-02-12 DIAGNOSIS — Z23 Encounter for immunization: Secondary | ICD-10-CM | POA: Insufficient documentation

## 2020-02-12 NOTE — Progress Notes (Signed)
   Covid-19 Vaccination Clinic  Name:  KMARI PELL    MRN: [id number] DOB: [birth year]  02/12/2020  Mr. Fink was observed post Covid-19 immunization for 15 minutes without incidence. He was provided with Vaccine Information Sheet and instruction to access the V-Safe system.   Mr. Stueck was instructed to call 911 with any severe reactions post vaccine: Marland Kitchen Difficulty breathing  . Swelling of your face and throat  . A fast heartbeat  . A bad rash all over your body  . Dizziness and weakness    Immunizations Administered    Name Date Dose VIS Date Route   Pfizer COVID-19 Vaccine 02/12/2020  1:41 PM 0.3 mL 12/03/2019 Intramuscular   Manufacturer: [place]   Lot: [id number]   [place]: [id number]

## 2020-02-14 ENCOUNTER — Ambulatory Visit (INDEPENDENT_AMBULATORY_CARE_PROVIDER_SITE_OTHER): Payer: Medicare Other | Admitting: Family Medicine

## 2020-02-14 ENCOUNTER — Other Ambulatory Visit: Payer: Self-pay

## 2020-02-14 ENCOUNTER — Other Ambulatory Visit: Payer: Self-pay | Admitting: Family Medicine

## 2020-02-14 ENCOUNTER — Encounter: Payer: Self-pay | Admitting: Family Medicine

## 2020-02-14 VITALS — BP 120/78 | HR 79 | Temp 97.1°F | Resp 18 | Ht 69.0 in | Wt 227.4 lb

## 2020-02-14 DIAGNOSIS — G4733 Obstructive sleep apnea (adult) (pediatric): Secondary | ICD-10-CM

## 2020-02-14 DIAGNOSIS — M1A00X0 Idiopathic chronic gout, unspecified site, without tophus (tophi): Secondary | ICD-10-CM

## 2020-02-14 DIAGNOSIS — R351 Nocturia: Secondary | ICD-10-CM | POA: Diagnosis not present

## 2020-02-14 DIAGNOSIS — Z0000 Encounter for general adult medical examination without abnormal findings: Secondary | ICD-10-CM

## 2020-02-14 DIAGNOSIS — I2699 Other pulmonary embolism without acute cor pulmonale: Secondary | ICD-10-CM

## 2020-02-14 DIAGNOSIS — E1142 Type 2 diabetes mellitus with diabetic polyneuropathy: Secondary | ICD-10-CM

## 2020-02-14 DIAGNOSIS — E1121 Type 2 diabetes mellitus with diabetic nephropathy: Secondary | ICD-10-CM

## 2020-02-14 DIAGNOSIS — C4490 Unspecified malignant neoplasm of skin, unspecified: Secondary | ICD-10-CM

## 2020-02-14 DIAGNOSIS — E1165 Type 2 diabetes mellitus with hyperglycemia: Secondary | ICD-10-CM | POA: Diagnosis not present

## 2020-02-14 DIAGNOSIS — I10 Essential (primary) hypertension: Secondary | ICD-10-CM

## 2020-02-14 DIAGNOSIS — R42 Dizziness and giddiness: Secondary | ICD-10-CM | POA: Diagnosis not present

## 2020-02-14 DIAGNOSIS — M4316 Spondylolisthesis, lumbar region: Secondary | ICD-10-CM

## 2020-02-14 DIAGNOSIS — I5032 Chronic diastolic (congestive) heart failure: Secondary | ICD-10-CM

## 2020-02-14 DIAGNOSIS — R29898 Other symptoms and signs involving the musculoskeletal system: Secondary | ICD-10-CM

## 2020-02-14 DIAGNOSIS — K219 Gastro-esophageal reflux disease without esophagitis: Secondary | ICD-10-CM

## 2020-02-14 LAB — LIPID PANEL
Cholesterol: 187 mg/dL (ref 0–200)
HDL: 43.5 mg/dL (ref >39.00)
NonHDL: 143.04
Total CHOL/HDL Ratio: 4
Triglycerides: 346 mg/dL — ABNORMAL HIGH (ref 0.0–149.0)
VLDL: 69.2 mg/dL — ABNORMAL HIGH (ref 0.0–40.0)

## 2020-02-14 LAB — COMPREHENSIVE METABOLIC PANEL
ALT: 45 U/L (ref 0–53)
AST: 24 U/L (ref 0–37)
Albumin: 4.3 g/dL (ref 3.5–5.2)
Alkaline Phosphatase: 87 U/L (ref 39–117)
BUN: 14 mg/dL (ref 6–23)
CO2: 21 mEq/L (ref 19–32)
Calcium: 9.8 mg/dL (ref 8.4–10.5)
Chloride: 111 mEq/L (ref 96–112)
Creatinine, Ser: 1.07 mg/dL (ref 0.40–1.50)
GFR: 68.36 mL/min (ref >60.00)
Glucose, Bld: 117 mg/dL — ABNORMAL HIGH (ref 70–99)
Potassium: 4.3 mEq/L (ref 3.5–5.1)
Sodium: 144 mEq/L (ref 135–145)
Total Bilirubin: 0.3 mg/dL (ref 0.2–1.2)
Total Protein: 6.8 g/dL (ref 6.0–8.3)

## 2020-02-14 LAB — LDL CHOLESTEROL, DIRECT: Direct LDL: 104 mg/dL

## 2020-02-14 LAB — HEMOGLOBIN A1C: Hgb A1c MFr Bld: 6.6 % — ABNORMAL HIGH (ref 4.6–6.5)

## 2020-02-14 LAB — PSA: PSA: 1.35 ng/mL (ref 0.10–4.00)

## 2020-02-14 LAB — URIC ACID: Uric Acid, Serum: 5.3 mg/dL (ref 4.0–7.8)

## 2020-02-14 MED ORDER — MECLIZINE HCL 25 MG PO TABS: 25.0000 mg | ORAL_TABLET | Freq: Three times a day (TID) | ORAL | 0 refills | Status: DC | PRN
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
## 2020-02-14 NOTE — Assessment & Plan Note (Signed)
Per cardiology 

## 2020-02-14 NOTE — Assessment & Plan Note (Signed)
xaralto -- lifelong

## 2020-02-14 NOTE — Assessment & Plan Note (Signed)
Per pulm  On cpap 

## 2020-02-14 NOTE — Assessment & Plan Note (Signed)
con't xaralto Life long anticoagulation

## 2020-02-14 NOTE — Assessment & Plan Note (Signed)
Check labs Podiatry referral

## 2020-02-14 NOTE — Patient Instructions (Signed)

## 2020-02-14 NOTE — Assessment & Plan Note (Signed)
Well controlled, no changes to meds. Encouraged heart healthy diet such as the DASH diet and exercise as tolerated.  °

## 2020-02-14 NOTE — Assessment & Plan Note (Signed)
ghm utd Check labs See AVS 

## 2020-02-16 ENCOUNTER — Other Ambulatory Visit: Payer: Self-pay | Admitting: Family Medicine

## 2020-02-16 ENCOUNTER — Other Ambulatory Visit: Payer: Self-pay

## 2020-02-16 DIAGNOSIS — E1169 Type 2 diabetes mellitus with other specified complication: Secondary | ICD-10-CM

## 2020-02-16 DIAGNOSIS — E785 Hyperlipidemia, unspecified: Secondary | ICD-10-CM

## 2020-02-16 DIAGNOSIS — E1165 Type 2 diabetes mellitus with hyperglycemia: Secondary | ICD-10-CM

## 2020-02-16 MED ORDER — FENOFIBRATE 160 MG PO TABS: 160.0000 mg | ORAL_TABLET | Freq: Every day | ORAL | 2 refills | Status: DC

## 2020-02-21 ENCOUNTER — Other Ambulatory Visit: Payer: Self-pay | Admitting: Neurology

## 2020-02-22 ENCOUNTER — Ambulatory Visit: Payer: Medicare Other | Attending: Family Medicine | Admitting: Physical Therapy

## 2020-02-22 ENCOUNTER — Other Ambulatory Visit: Payer: Self-pay

## 2020-02-22 DIAGNOSIS — G8929 Other chronic pain: Secondary | ICD-10-CM | POA: Diagnosis not present

## 2020-02-22 DIAGNOSIS — R2689 Other abnormalities of gait and mobility: Secondary | ICD-10-CM

## 2020-02-22 DIAGNOSIS — R2681 Unsteadiness on feet: Secondary | ICD-10-CM | POA: Insufficient documentation

## 2020-02-22 DIAGNOSIS — M6281 Muscle weakness (generalized): Secondary | ICD-10-CM | POA: Diagnosis not present

## 2020-02-22 DIAGNOSIS — M5441 Lumbago with sciatica, right side: Secondary | ICD-10-CM | POA: Diagnosis not present
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
## 2020-02-22 NOTE — Patient Instructions (Signed)
    Home exercise program created by Printice Hellmer, PT.  For questions, please contact Niki Cosman via phone at [phone] or email at [email or web address]  [facility] [address]  [address] [place], [place], [address] Phone: [phone]   Fax:  [phone]    

## 2020-02-24 ENCOUNTER — Ambulatory Visit: Payer: Medicare Other

## 2020-02-24 ENCOUNTER — Other Ambulatory Visit: Payer: Self-pay

## 2020-02-24 DIAGNOSIS — R2681 Unsteadiness on feet: Secondary | ICD-10-CM

## 2020-02-24 DIAGNOSIS — M6281 Muscle weakness (generalized): Secondary | ICD-10-CM | POA: Diagnosis not present

## 2020-02-24 DIAGNOSIS — M5441 Lumbago with sciatica, right side: Secondary | ICD-10-CM | POA: Diagnosis not present

## 2020-02-24 DIAGNOSIS — G8929 Other chronic pain: Secondary | ICD-10-CM

## 2020-02-24 DIAGNOSIS — R2689 Other abnormalities of gait and mobility: Secondary | ICD-10-CM | POA: Diagnosis not present
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
## 2020-02-29 ENCOUNTER — Other Ambulatory Visit: Payer: Self-pay

## 2020-02-29 ENCOUNTER — Ambulatory Visit: Payer: Medicare Other | Admitting: Physical Therapy

## 2020-02-29 ENCOUNTER — Encounter: Payer: Self-pay | Admitting: Physical Therapy

## 2020-02-29 ENCOUNTER — Other Ambulatory Visit: Payer: Self-pay | Admitting: Family Medicine

## 2020-02-29 DIAGNOSIS — R2681 Unsteadiness on feet: Secondary | ICD-10-CM | POA: Diagnosis not present

## 2020-02-29 DIAGNOSIS — M5441 Lumbago with sciatica, right side: Secondary | ICD-10-CM | POA: Diagnosis not present

## 2020-02-29 DIAGNOSIS — G8929 Other chronic pain: Secondary | ICD-10-CM

## 2020-02-29 DIAGNOSIS — R05 Cough: Secondary | ICD-10-CM

## 2020-02-29 DIAGNOSIS — R2689 Other abnormalities of gait and mobility: Secondary | ICD-10-CM

## 2020-02-29 DIAGNOSIS — M6281 Muscle weakness (generalized): Secondary | ICD-10-CM | POA: Diagnosis not present

## 2020-02-29 DIAGNOSIS — R059 Cough, unspecified: Secondary | ICD-10-CM
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
## 2020-03-01 ENCOUNTER — Ambulatory Visit: Payer: Medicare Other | Admitting: Podiatry

## 2020-03-01 ENCOUNTER — Ambulatory Visit (INDEPENDENT_AMBULATORY_CARE_PROVIDER_SITE_OTHER): Payer: Medicare Other

## 2020-03-01 DIAGNOSIS — E1121 Type 2 diabetes mellitus with diabetic nephropathy: Secondary | ICD-10-CM

## 2020-03-01 DIAGNOSIS — M2042 Other hammer toe(s) (acquired), left foot: Secondary | ICD-10-CM

## 2020-03-01 DIAGNOSIS — M79672 Pain in left foot: Secondary | ICD-10-CM | POA: Diagnosis not present

## 2020-03-01 DIAGNOSIS — M2041 Other hammer toe(s) (acquired), right foot: Secondary | ICD-10-CM

## 2020-03-01 DIAGNOSIS — M79671 Pain in right foot: Secondary | ICD-10-CM

## 2020-03-02 ENCOUNTER — Encounter: Payer: Self-pay | Admitting: Podiatry
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
## 2020-03-03 ENCOUNTER — Other Ambulatory Visit: Payer: Self-pay | Admitting: Podiatry

## 2020-03-03 ENCOUNTER — Ambulatory Visit: Payer: Medicare Other

## 2020-03-03 DIAGNOSIS — M2042 Other hammer toe(s) (acquired), left foot: Secondary | ICD-10-CM

## 2020-03-03 DIAGNOSIS — M2041 Other hammer toe(s) (acquired), right foot: Secondary | ICD-10-CM

## 2020-03-06 ENCOUNTER — Other Ambulatory Visit: Payer: Self-pay | Admitting: Adult Health

## 2020-03-07 ENCOUNTER — Encounter: Payer: Medicare Other | Admitting: Physical Therapy

## 2020-03-07 ENCOUNTER — Ambulatory Visit: Payer: Medicare Other | Attending: Internal Medicine

## 2020-03-07 ENCOUNTER — Telehealth: Payer: Self-pay | Admitting: Family Medicine

## 2020-03-07 DIAGNOSIS — Z23 Encounter for immunization: Secondary | ICD-10-CM

## 2020-03-07 NOTE — Progress Notes (Signed)
   Covid-19 Vaccination Clinic  Name:  Garrett [name]    MRN: [id number] DOB: 1951-10-09  03/07/2020  [name] was observed post Covid-19 immunization for 30 minutes based on pre-vaccination screening without incident. He was provided with Vaccine Information Sheet and instruction to access the V-Safe system.   [name] was instructed to call 911 with any severe reactions post vaccine: Marland Kitchen Difficulty breathing  . Swelling of face and throat  . A fast heartbeat  . A bad rash all over body  . Dizziness and weakness   Immunizations Administered    Name Date Dose VIS Date Route   Pfizer COVID-19 Vaccine 03/07/2020 12:10 PM 0.3 mL 12/03/2019 Intramuscular   Manufacturer: [place]   Lot: [id number]   [place]: [id number]

## 2020-03-07 NOTE — Progress Notes (Signed)
  Chronic Care Management   Note  03/07/2020 Name: Garrett [name] MRN: [id number] DOB: Oct 03, 1951  Garrett [name] is a 69 y.o. year old male who is a primary care patient of Ann Held, DO. I reached out to Lindalou Hose by phone today in response to a referral sent by Garrett [name]'s PCP, Carollee Herter, Alferd Apa, DO.   Garrett [name] was given information about Chronic Care Management services today including:  1. CCM service includes personalized support from designated clinical staff supervised by his physician, including individualized plan of care and coordination with other care providers 2. 24/7 contact phone numbers for assistance for urgent and routine care needs. 3. Service will only be billed when office clinical staff spend 20 minutes or more in a month to coordinate care. 4. Only one practitioner may furnish and bill the service in a calendar month. 5. The patient may stop CCM services at any time (effective at the end of the month) by phone call to the office staff.   Patient agreed to services and verbal consent obtained.   Follow up plan:   Raynicia Dukes [employer] Scheduler

## 2020-03-10 ENCOUNTER — Encounter: Payer: Medicare Other | Admitting: Physical Therapy

## 2020-03-13 ENCOUNTER — Other Ambulatory Visit: Payer: Self-pay | Admitting: Family Medicine

## 2020-03-13 DIAGNOSIS — E119 Type 2 diabetes mellitus without complications: Secondary | ICD-10-CM | POA: Diagnosis not present

## 2020-03-13 DIAGNOSIS — H26493 Other secondary cataract, bilateral: Secondary | ICD-10-CM | POA: Diagnosis not present

## 2020-03-13 DIAGNOSIS — H40013 Open angle with borderline findings, low risk, bilateral: Secondary | ICD-10-CM | POA: Diagnosis not present

## 2020-03-13 DIAGNOSIS — H5022 Vertical strabismus, left eye: Secondary | ICD-10-CM | POA: Diagnosis not present

## 2020-03-13 DIAGNOSIS — K219 Gastro-esophageal reflux disease without esophagitis: Secondary | ICD-10-CM

## 2020-03-13 DIAGNOSIS — H4912 Fourth [trochlear] nerve palsy, left eye: Secondary | ICD-10-CM | POA: Diagnosis not present

## 2020-03-14 ENCOUNTER — Encounter: Payer: Self-pay | Admitting: Physical Therapy

## 2020-03-14 ENCOUNTER — Ambulatory Visit: Payer: Medicare Other | Admitting: Gastroenterology

## 2020-03-14 ENCOUNTER — Other Ambulatory Visit: Payer: Self-pay

## 2020-03-14 ENCOUNTER — Ambulatory Visit: Payer: Medicare Other | Admitting: Physical Therapy

## 2020-03-14 DIAGNOSIS — R2689 Other abnormalities of gait and mobility: Secondary | ICD-10-CM | POA: Diagnosis not present

## 2020-03-14 DIAGNOSIS — M6281 Muscle weakness (generalized): Secondary | ICD-10-CM | POA: Diagnosis not present

## 2020-03-14 DIAGNOSIS — G8929 Other chronic pain: Secondary | ICD-10-CM

## 2020-03-14 DIAGNOSIS — M5441 Lumbago with sciatica, right side: Secondary | ICD-10-CM | POA: Diagnosis not present

## 2020-03-14 DIAGNOSIS — R2681 Unsteadiness on feet: Secondary | ICD-10-CM
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
## 2020-03-14 NOTE — Patient Instructions (Addendum)

## 2020-03-17 ENCOUNTER — Other Ambulatory Visit: Payer: Self-pay

## 2020-03-17 ENCOUNTER — Ambulatory Visit: Payer: Medicare Other

## 2020-03-17 DIAGNOSIS — M6281 Muscle weakness (generalized): Secondary | ICD-10-CM | POA: Diagnosis not present

## 2020-03-17 DIAGNOSIS — R2681 Unsteadiness on feet: Secondary | ICD-10-CM

## 2020-03-17 DIAGNOSIS — R2689 Other abnormalities of gait and mobility: Secondary | ICD-10-CM | POA: Diagnosis not present

## 2020-03-17 DIAGNOSIS — M5441 Lumbago with sciatica, right side: Secondary | ICD-10-CM | POA: Diagnosis not present

## 2020-03-17 DIAGNOSIS — G8929 Other chronic pain: Secondary | ICD-10-CM
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
## 2020-03-20 ENCOUNTER — Other Ambulatory Visit: Payer: Self-pay

## 2020-03-20 DIAGNOSIS — I10 Essential (primary) hypertension: Secondary | ICD-10-CM

## 2020-03-20 DIAGNOSIS — E1169 Type 2 diabetes mellitus with other specified complication: Secondary | ICD-10-CM

## 2020-03-20 DIAGNOSIS — E1165 Type 2 diabetes mellitus with hyperglycemia: Secondary | ICD-10-CM

## 2020-03-21 ENCOUNTER — Other Ambulatory Visit: Payer: Self-pay

## 2020-03-21 ENCOUNTER — Ambulatory Visit: Payer: Medicare Other | Admitting: Pharmacist

## 2020-03-21 ENCOUNTER — Ambulatory Visit: Payer: Medicare Other | Admitting: Physical Therapy

## 2020-03-21 DIAGNOSIS — E785 Hyperlipidemia, unspecified: Secondary | ICD-10-CM

## 2020-03-21 DIAGNOSIS — F03A0 Unspecified dementia, mild, without behavioral disturbance, psychotic disturbance, mood disturbance, and anxiety: Secondary | ICD-10-CM

## 2020-03-21 DIAGNOSIS — E1169 Type 2 diabetes mellitus with other specified complication: Secondary | ICD-10-CM

## 2020-03-21 DIAGNOSIS — I10 Essential (primary) hypertension: Secondary | ICD-10-CM

## 2020-03-21 DIAGNOSIS — I5032 Chronic diastolic (congestive) heart failure: Secondary | ICD-10-CM

## 2020-03-21 DIAGNOSIS — M4302 Spondylolysis, cervical region: Secondary | ICD-10-CM

## 2020-03-21 DIAGNOSIS — J45991 Cough variant asthma: Secondary | ICD-10-CM

## 2020-03-21 DIAGNOSIS — M170 Bilateral primary osteoarthritis of knee: Secondary | ICD-10-CM

## 2020-03-21 DIAGNOSIS — K219 Gastro-esophageal reflux disease without esophagitis: Secondary | ICD-10-CM

## 2020-03-21 DIAGNOSIS — I2699 Other pulmonary embolism without acute cor pulmonale: Secondary | ICD-10-CM

## 2020-03-21 DIAGNOSIS — E1121 Type 2 diabetes mellitus with diabetic nephropathy: Secondary | ICD-10-CM

## 2020-03-21 DIAGNOSIS — F0390 Unspecified dementia without behavioral disturbance: Secondary | ICD-10-CM

## 2020-03-21 NOTE — Chronic Care Management (AMB) (Signed)
Chronic Care Management Pharmacy  Name: Garrett [name]  MRN: [id number] DOB: 08-03-1951  Chief Complaint/ HPI  Garrett [name],  69 y.o. , male presents for their Initial CCM visit with the clinical pharmacist via telephone due to COVID-19 Pandemic.  PCP : Ann Held, DO  Their chronic conditions include: DM, HLD, HF, HTN, Hx of PE, Asthma, GERD, Dementia, Migraine, Allergies, Gout, Pain, Vertigo  Office Visits: 02/14/20: Visit w/ Dr. Etter Sjogren - Annual Exam. Pt has back and leg pain. Requests referral to a different neurosurgeon. Meclizine added for vertigo. No other med changes noted. Labs ordered (cmp, a1c, lipid, ldl direct, psa, uric acid). Referrals ordered (neurosurgery, PT, gastro, podiatry)  02/03/20: Medicare Annual Wellness Exam w/ Naaman Plummer, RN - Goal updated to increase physical activity  Consult Visit: 03/01/20: Podiatry visit w/ Dr. Posey Pronto - Diabetic foot exam. Patient referred for diabetic shoes noting contractures and superficial abrasion on the right dorsal aspect of midfoot.   02/28/20: Audiology visit w/ Dr. Rachell Cipro - Hearing aid follow up. Hearing aids re-paired to cell phone and the My Phonak phone application successfully. No adjustments made. Follow up in 3 weeks   01/12/20: Pulm visit w/ Dr. Melvyn Novas - No med changes noted  Urgent Care Visit:  01/18/20: [facility] [place] - Cough and head congestion. Considering comorbidities, pt prescribed doxycycline plus prednisone burst/taper. With COVID send out. Follow up with pulmonologist if not improving in 1 week.  Medications: Outpatient Encounter Medications as of 03/21/2020  Medication Sig Note  . acidophilus (RISAQUAD) CAPS capsule Take 1 capsule by mouth daily.    Marland Kitchen albuterol (PROVENTIL) (2.5 MG/3ML) 0.083% nebulizer solution Take 3 mLs (2.5 mg total) by nebulization every 6 (six) hours as needed for wheezing or shortness of breath.   . allopurinol (ZYLOPRIM) 100 MG tablet Take 1 tablet  (100 mg total) by mouth daily.   . budesonide-formoterol (SYMBICORT) 80-4.5 MCG/ACT inhaler Inhale 2 puffs into the lungs 2 (two) times daily.   . celecoxib (CELEBREX) 200 MG capsule TAKE 1 CAPSULE BY MOUTH  TWICE DAILY   . Cyanocobalamin 5000 MCG TBDP Take 5,000 mcg by mouth daily.  01/08/2019: B-12  . EPINEPHrine (EPIPEN) 0.3 mg/0.3 mL SOAJ injection Inject 0.3 mg into the muscle as needed for anaphylaxis.    . fenofibrate 160 MG tablet Take 1 tablet (160 mg total) by mouth daily.   . fluticasone (FLONASE) 50 MCG/ACT nasal spray Place 2 sprays into both nostrils daily.   . furosemide (LASIX) 20 MG tablet Take 1 tablet (20 mg total) by mouth daily.   Marland Kitchen gabapentin (NEURONTIN) 100 MG capsule Take 100 mg by mouth at bedtime.    Marland Kitchen glucose blood (ONE TOUCH ULTRA TEST) test strip USE AS DIRECTED THREE TIMES DAILY.  DX CODE E11.21   . levocetirizine (XYZAL) 5 MG tablet TAKE 1 TABLET BY MOUTH IN  THE EVENING   . magnesium oxide (MAG-OX) 400 (241.3 Mg) MG tablet Take 1 tablet (400 mg total) by mouth daily.   . meclizine (ANTIVERT) 25 MG tablet Take 1 tablet (25 mg total) by mouth 3 (three) times daily as needed for dizziness.   . memantine (NAMENDA) 10 MG tablet Take 2 tablets (20 mg total) by mouth daily.   . metFORMIN (GLUCOPHAGE) 500 MG tablet Take 500 mg by mouth 2 (two) times daily.   . metoprolol tartrate (LOPRESSOR) 25 MG tablet Take 1 tablet (25 mg total) by mouth 2 (two) times daily.   Marland Kitchen  Multiple Vitamins-Minerals (ONE-A-[place] WEIGHT SMART ADVANCE PO) Take 1 tablet by mouth daily. Centrum Silver   . pantoprazole (PROTONIX) 40 MG tablet TAKE 1 TABLET BY MOUTH 2  TIMES DAILY BEFORE A MEAL.   Marland Kitchen potassium chloride SA (K-DUR) 20 MEQ tablet Take 2 tablets (40 mEq total) by mouth daily. 03/21/2020: Takes 1 tab BID  . pravastatin (PRAVACHOL) 10 MG tablet Take 1 tablet (10 mg total) by mouth daily.   Marland Kitchen PROAIR HFA 108 (90 Base) MCG/ACT inhaler Inhale 1 puff into the lungs every 6 (six) hours as needed for  wheezing or shortness of breath.    . SUMAtriptan (IMITREX) 50 MG tablet Take 1 tablet as needed for migraine/vertigo. Do not take more than 3 a week   . topiramate (TOPAMAX) 50 MG tablet TAKE 1 TABLET BY MOUTH  TWICE DAILY   . verapamil (CALAN-SR) 240 MG CR tablet TAKE 1 TABLET BY MOUTH AT  BEDTIME   . XARELTO 20 MG TABS tablet TAKE 1 TABLET BY MOUTH  DAILY WITH SUPPER   . mometasone (NASONEX) 50 MCG/ACT nasal spray Place 2 sprays into the nose daily. (Patient not taking: Reported on 03/21/2020) 03/21/2020: Now using flonase  . Tdap (BOOSTRIX) 5-2.5-18.5 LF-MCG/0.5 injection Boostrix Tdap 2.5 Lf unit-8 mcg-5 Lf/0.5 mL intramuscular syringe    No facility-administered encounter medications on file as of 03/21/2020.     Current Diagnosis/Assessment:  Goals Addressed            This Visit's Progress   . A1c goal less than 7%      . Blood pressure goal less than 140/90      . Consider risk/benefit of continuing celecoxib for pain and what available and appropriate alternatives there are for pain      . Discuss the efficacy of (how well it works) gabapentin for your pain with Dr. Tamala Julian      . LDL goal less than 100      . Pharmacy Care Plan       CARE PLAN ENTRY  Current Barriers:  . Chronic Disease Management support, education, and care coordination needs related to DM, HLD, HF, HTN, Hx of PE, Asthma, GERD, Dementia, Migraine, Allergies, Gout, Pain, Vertigo  Pharmacist Clinical Goal(s):  Marland Kitchen A1c goal <7% . LDL goal <100 . Blood pressure goal <140/90 . Discuss efficacy of gabapentin with Dr. Tamala Julian . Consider risk/benefit of continuing celecoxib for pain and what available and appropriate alternatives there are for pain  Interventions: . Comprehensive medication review performed. . Use symbicort twice daily  Patient Self Care Activities:  . Patient verbalizes understanding of plan to follow as described above, Self administers medications as prescribed, Calls pharmacy for medication  refills, and Calls provider office for new concerns or questions  Initial goal documentation     . Use symbicort twice daily       Using your symbicort twice daily could help you use your rescue inhaler less often       Social Hx:  Married 51 years. Has a son that lives locally. Wife had open heart surgery 10 days ago.  Uses pill box  Diabetes   Recent Relevant Labs: Lab Results  Component Value Date/Time   HGBA1C 6.6 (H) 02/14/2020 10:25 AM   HGBA1C 6.2 04/09/2019 09:01 AM   MICROALBUR <0.7 04/09/2019 09:01 AM   MICROALBUR <0.7 02/10/2018 10:29 AM    A1c goal <7 FBG 80-130 PPBG <180  Checking BG: Daily  Recent FBG Readings: 140-160s No s/sx of  hypoglycemia. "I have never seen my number under 100" Highest: 185  Lowest: 130 Patient is currently controlled on the following medications: metformin 500mg  BID  Last diabetic Eye exam:  Lab Results  Component Value Date/Time   HMDIABEYEEXA No Retinopathy 12/21/2018 12:34 PM    Last diabetic Foot exam: No results found for: HMDIABFOOTEX    Plan -Continue current medications   Hyperlipidemia   Lipid Panel     Component Value Date/Time   CHOL 187 02/14/2020 1025   CHOL 162 07/18/2015 0744   TRIG 346.0 (H) 02/14/2020 1025   TRIG 110 07/18/2015 0744   HDL 43.50 02/14/2020 1025   HDL 41 07/18/2015 0744   CHOLHDL 4 02/14/2020 1025   VLDL 69.2 (H) 02/14/2020 1025   LDLCALC 91 04/09/2019 0901   LDLCALC 99 07/18/2015 0744   LDLDIRECT 104.0 02/14/2020 1025    LDL goal <100  The 10-year ASCVD risk score Mikey Bussing DC Jr., et al., 2013) is: 32%   Values used to calculate the score:     Age: 67 years     Sex: Male     Is Non-Hispanic African American: No     Diabetic: Yes     Tobacco smoker: No     Systolic Blood Pressure: 123456 mmHg     Is BP treated: Yes     HDL Cholesterol: 43.5 mg/dL     Total Cholesterol: 187 mg/dL   Patient has failed these meds in past: None noted  Patient is currently controlled, except for  TG on the following medications: pravastatin 10mg , daily, fenofibrate 160mg  daily  Is not happy with elevated TG and increased blood His back pain limits the exercise he can do Feels that PT is causing more pain  We discussed:  diet and exercise extensively  Plan -Continue current medications  -Consider increasing pravastatin if LDL >100 at next lab visit  Heart Failure   Type: Diastolic  Last ejection fraction: 03/03/17 EF: 65-70% NYHA Class: II (slight limitation of activity) AHA HF Stage: B (Heart disease present - no symptoms present)  Patient has failed these meds in past: None noted  Patient is currently controlled on the following medications: furosemide 20mg  daily, potassium chloride 52mEq BID,   Plan -Continue current medications  Hypertension   CMP Latest Ref Rng & Units 02/14/2020 04/09/2019 01/08/2019  Glucose 70 - 99 mg/dL 117(H) 112(H) 109(H)  BUN 6 - 23 mg/dL 14 18 13   Creatinine 0.40 - 1.50 mg/dL 1.07 1.05 1.10  Sodium 135 - 145 mEq/L 144 141 142  Potassium 3.5 - 5.1 mEq/L 4.3 3.8 3.7  Chloride 96 - 112 mEq/L 111 109 110  CO2 19 - 32 mEq/L 21 22 25   Calcium 8.4 - 10.5 mg/dL 9.8 9.0 9.1  Total Protein 6.0 - 8.3 g/dL 6.8 6.7 -  Total Bilirubin 0.2 - 1.2 mg/dL 0.3 0.3 -  Alkaline Phos 39 - 117 U/L 87 85 -  AST 0 - 37 U/L 24 26 -  ALT 0 - 53 U/L 45 39 -  GFR      68.36   70.04   >60  BP today is:  142/90 (per pt BP cuff while on phone)  Office blood pressures are  BP Readings from Last 3 Encounters:  02/14/20 120/78  02/03/20 135/90  01/18/20 136/80   Goal <140/90  Patient has failed these meds in the past: irbesartan, ramipril (concern for cough?), terazosin (stopped due to low BP) Patient is currently controlled on the following medications: verapamil  240mg  daily HS, metoprolol tartrate 25mg  BID,   Patient checks BP at home 3-5x per week  Patient home BP readings are ranging: 130s-140s/80s-90s No chest pains  Plan -Continue current medications      Hx of Pulmonary Embolism    Patient has failed these meds in past: None noted   Patient is currently controlled on the following medications: Xarelto 20mg  daily  Stopped Xarelto previously for 2 months and developed PE again.  Plan -Continue current medications  Asthma / Tobacco   Eosinophil count:   Lab Results  Component Value Date/Time   EOSPCT 10.2 (H) 01/06/2019 01:11 PM   EOSPCT 5.4 12/04/2017 02:18 PM  %                               Eos (Absolute):  Lab Results  Component Value Date/Time   EOSABS 0.9 (H) 01/06/2019 01:11 PM   EOSABS 0.5 12/04/2017 02:18 PM    Tobacco Status:  Social History   Tobacco Use  Smoking Status Former Smoker  . Packs/Bary Limbach: 3.00  . Years: 30.00  . Pack years: 90.00  . Types: Cigarettes  . Quit date: 01/09/1991  . Years since quitting: 29.2  Smokeless Tobacco Current User  . Types: Snuff    Patient has failed these meds in past: None noted  Patient is currently controlled on the following medications: symbicort 80-4.5mg  2 puffs once daily (prescribed twice daily. Will only use twice daily during allergy season), proair PRN, albuterol nebs (when he gets bronchitis and will use for a Laiylah Roettger or two) Using maintenance inhaler regularly? Yes Frequency of rescue inhaler use:  1-2x per week (especially during spring months)  Followed by Dr. Melvyn Novas Feels SOB with exertion/PT  We discussed:  Making sure to rinse mouth out after using Symbicort and using Symbicort twice daily as prescribed  Plan -Continue current medications  -Use symbicort twice daily with goal to use albuterol less frequently   GERD    Patient has failed these meds in past: None noted  Patient is currently uncontrolled on the following medications: pantoprazole 40mg  BID  Has an appt with Dr. [name] next month He eats bland foods Breakthrough Sx: Has lots of reflux daily even while taking BID. Happens more when he lays down Breakthrough Tx: Uses tums, milk, ice  cream  Plan -Continue current medications  Dementia    Patient has failed these meds in past: None noted  Patient is currently controlled on the following medications: memantine 10mg  #2 daily  Feels this does help with his memory Has taken 7-8 years   Plan -Continue current medications  Migraine    Patient has failed these meds in past: None noted  Patient is currently controlled on the following medications: topiramate 50mg  BID, sumatriptan 50mg  PRN  Sumatriptan: Last needed a week ago. Uses about once or twice per month Trigger: stress  Feel that he has less stress than before  Used to have migraines daily  Plan -Continue current medications   Allergies    Patient has failed these meds in past: None noted  Patient is currently controlled on the following medications: levocetirizine 5mg  daily, fluticasone 44mcg 2 sprays EN daily  Plan -Continue current medications   Neuropathy?    Patient has failed these meds in past: None noted Patient is currently controlled on the following medications: gabapentin 100mg  daily HS  Gabapentin: Was not sure why he was taking this medication. Not sure  if it helping with anything  Prescribed by Dr. Tamala Julian (ortho) when he was having problem with hip and knees. Was getting cortisone shots for this.  We discussed:  Risk benefit of continuing vs discontinuing medication  Plan -Discuss efficacy of gabapentin with Dr. Tamala Julian -Continue current medications for now   Gout    Patient has failed these meds in past: None noted  Patient is currently stable on the following medications: allopurinol 100mg  daily   Gout Presence: Constant  Triggers: Loves seafood. Knows this is a trigger, but likes to continue eating it.   We discussed:  Reducing/avoiding triggers to help with gout symptoms  Plan -Continue current medications   Pain    Patient has failed these meds in past: None noted  Patient is currently controlled on the  following medications: celecoxib 200mg  #2 daily  Usual Pain Score: 6-7 Gets worse with sitting in one position   Does not feel celecoxib is working any more He tried taking 1 BID, but that was even less effective than #2 at one time Wondering if there is a better alternative for him  We discussed:  Risk/benefit of D/C'ing celecoxib noting that he is currently still in pain which could or could not get worse if he D/C  Plan -Continue current medications  -Will consider other possible alternatives  Vertigo     Patient has failed these meds in past: None noted  Patient is currently controlled on the following medications: meclizine 25mg  PRN  Last used about 2 weeks ago.  Has dizzy spells once a week to every other week  Plan -Continue current medications  Miscellaneous Meds Ester-C 1000mg  daily

## 2020-03-22 NOTE — Patient Instructions (Signed)
Visit Information  Goals Addressed            This Visit's Progress   . A1c goal less than 7%      . Blood pressure goal less than 140/90      . Consider risk/benefit of continuing celecoxib for pain and what available and appropriate alternatives there are for pain      . Discuss the efficacy of (how well it works) gabapentin for your pain with Dr. Tamala Julian      . LDL goal less than 100      . Pharmacy Care Plan       CARE PLAN ENTRY  Current Barriers:  . Chronic Disease Management support, education, and care coordination needs related to DM, HLD, HF, HTN, Hx of PE, Asthma, GERD, Dementia, Migraine, Allergies, Gout, Pain, Vertigo  Pharmacist Clinical Goal(s):  Marland Kitchen A1c goal <7% . LDL goal <100 . Blood pressure goal <140/90 . Discuss efficacy of gabapentin with Dr. Tamala Julian . Consider risk/benefit of continuing celecoxib for pain and what available and appropriate alternatives there are for pain  Interventions: . Comprehensive medication review performed. . Use symbicort twice daily  Patient Self Care Activities:  . Patient verbalizes understanding of plan to follow as described above, Self administers medications as prescribed, Calls pharmacy for medication refills, and Calls provider office for new concerns or questions  Initial goal documentation     . Use symbicort twice daily       Using your symbicort twice daily could help you use your rescue inhaler less often       Mr. Ramsour was given information about Chronic Care Management services today including:  1. CCM service includes personalized support from designated clinical staff supervised by his physician, including individualized plan of care and coordination with other care providers 2. 24/7 contact phone numbers for assistance for urgent and routine care needs. 3. Standard insurance, coinsurance, copays and deductibles apply for chronic care management only during months in which we provide at least 20 minutes of  these services. Most insurances cover these services at 100%, however patients may be responsible for any copay, coinsurance and/or deductible if applicable. This service may help you avoid the need for more expensive face-to-face services. 4. Only one practitioner may furnish and bill the service in a calendar month. 5. The patient may stop CCM services at any time (effective at the end of the month) by phone call to the office staff.  Patient agreed to services and verbal consent obtained.   The patient verbalized understanding of instructions provided today and agreed to receive a mailed copy of patient instruction and/or educational materials. Telephone follow up appointment with pharmacy team member scheduled for: 09/19/2020  Melvenia Beam Emer Onnen, PharmD Clinical Pharmacist [facility] [phone]   Low-Purine Eating Plan A low-purine eating plan involves making food choices to limit your intake of purine. Purine is a kind of uric acid. Too much uric acid in your blood can cause certain conditions, such as gout and kidney stones. Eating a low-purine diet can help control these conditions. What are tips for following this plan? Reading food labels   Avoid foods with saturated or Trans fat.  Check the ingredient list of grains-based foods, such as bread and cereal, to make sure that they contain whole grains.  Check the ingredient list of sauces or soups to make sure they do not contain meat or fish.  When choosing soft drinks, check the ingredient list  to make sure they do not contain high-fructose corn syrup. Shopping  Buy plenty of fresh fruits and vegetables.  Avoid buying canned or fresh fish.  Buy dairy products labeled as low-fat or nonfat.  Avoid buying premade or processed foods. These foods are often high in fat, salt (sodium), and added sugar. Cooking  Use olive oil instead of butter when cooking. Oils like olive oil, canola oil, and  sunflower oil contain healthy fats. Meal planning  Learn which foods do or do not affect you. If you find out that a food tends to cause your gout symptoms to flare up, avoid eating that food. You can enjoy foods that do not cause problems. If you have any questions about a food item, talk with your dietitian or health care provider.  Limit foods high in fat, especially saturated fat. Fat makes it harder for your body to get rid of uric acid.  Choose foods that are lower in fat and are lean sources of protein. General guidelines  Limit alcohol intake to no more than 1 drink a Venus Ruhe for nonpregnant women and 2 drinks a Johanan Skorupski for men. One drink equals 12 oz of beer, 5 oz of wine, or 1 oz of hard liquor. Alcohol can affect the way your body gets rid of uric acid.  Drink plenty of water to keep your urine clear or pale yellow. Fluids can help remove uric acid from your body.  If directed by your health care provider, take a vitamin C supplement.  Work with your health care provider and dietitian to develop a plan to achieve or maintain a healthy weight. Losing weight can help reduce uric acid in your blood. What foods are recommended? The items listed may not be a complete list. Talk with your dietitian about what dietary choices are best for you. Foods low in purines Foods low in purines do not need to be limited. These include:  All fruits.  All low-purine vegetables, pickles, and olives.  Breads, pasta, rice, cornbread, and popcorn. Cake and other baked goods.  All dairy foods.  Eggs, nuts, and nut butters.  Spices and condiments, such as salt, herbs, and vinegar.  Plant oils, butter, and margarine.  Water, sugar-free soft drinks, tea, coffee, and cocoa.  Vegetable-based soups, broths, sauces, and gravies. Foods moderate in purines Foods moderate in purines should be limited to the amounts listed.   cup of asparagus, cauliflower, spinach, mushrooms, or green peas, each  Camella Seim.  2/3 cup uncooked oatmeal, each Karma Hiney.   cup dry wheat bran or wheat germ, each Ladesha Pacini.  2-3 ounces of meat or poultry, each Juno Bozard.  4-6 ounces of shellfish, such as crab, lobster, oysters, or shrimp, each Esker Dever.  1 cup cooked beans, peas, or lentils, each Chrystle Murillo.  Soup, broths, or bouillon made from meat or fish. Limit these foods as much as possible. What foods are not recommended? The items listed may not be a complete list. Talk with your dietitian about what dietary choices are best for you. Limit your intake of foods high in purines, including:  Beer and other alcohol.  Meat-based gravy or sauce.  Canned or fresh fish, such as: ? Anchovies, sardines, herring, and tuna. ? Mussels and scallops. ? Codfish, trout, and haddock.  Berniece Salines.  Organ meats, such as: ? Liver or kidney. ? Tripe. ? Sweetbreads (thymus gland or pancreas).  Wild Clinical biochemist.  Yeast or yeast extract supplements.  Drinks sweetened with high-fructose corn syrup. Summary  Eating a low-purine  diet can help control conditions caused by too much uric acid in the body, such as gout or kidney stones.  Choose low-purine foods, limit alcohol, and limit foods high in fat.  You will learn over time which foods do or do not affect you. If you find out that a food tends to cause your gout symptoms to flare up, avoid eating that food. This information is not intended to replace advice given to you by your health care provider. Make sure you discuss any questions you have with your health care provider. Document Revised: 11/21/2017 Document Reviewed: 01/22/2017 Elsevier Patient Education  2020 [employer].

## 2020-03-23 ENCOUNTER — Other Ambulatory Visit: Payer: Self-pay

## 2020-03-23 ENCOUNTER — Ambulatory Visit: Payer: Medicare Other | Admitting: Orthotics

## 2020-03-23 DIAGNOSIS — M79672 Pain in left foot: Secondary | ICD-10-CM

## 2020-03-23 DIAGNOSIS — M2041 Other hammer toe(s) (acquired), right foot: Secondary | ICD-10-CM

## 2020-03-23 DIAGNOSIS — M79671 Pain in right foot: Secondary | ICD-10-CM

## 2020-03-23 DIAGNOSIS — E1121 Type 2 diabetes mellitus with diabetic nephropathy: Secondary | ICD-10-CM

## 2020-03-23 NOTE — Progress Notes (Signed)

## 2020-03-24 ENCOUNTER — Ambulatory Visit: Payer: Medicare Other | Attending: Family Medicine

## 2020-03-24 DIAGNOSIS — M5441 Lumbago with sciatica, right side: Secondary | ICD-10-CM | POA: Diagnosis not present

## 2020-03-24 DIAGNOSIS — R42 Dizziness and giddiness: Secondary | ICD-10-CM | POA: Diagnosis not present

## 2020-03-24 DIAGNOSIS — M6281 Muscle weakness (generalized): Secondary | ICD-10-CM

## 2020-03-24 DIAGNOSIS — R2681 Unsteadiness on feet: Secondary | ICD-10-CM

## 2020-03-24 DIAGNOSIS — R2689 Other abnormalities of gait and mobility: Secondary | ICD-10-CM | POA: Insufficient documentation

## 2020-03-24 DIAGNOSIS — G8929 Other chronic pain: Secondary | ICD-10-CM | POA: Diagnosis not present
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
## 2020-03-29 ENCOUNTER — Ambulatory Visit: Payer: Medicare Other | Admitting: Physical Therapy

## 2020-03-29 ENCOUNTER — Other Ambulatory Visit: Payer: Self-pay

## 2020-03-29 ENCOUNTER — Encounter: Payer: Self-pay | Admitting: Physical Therapy

## 2020-03-29 DIAGNOSIS — R2689 Other abnormalities of gait and mobility: Secondary | ICD-10-CM

## 2020-03-29 DIAGNOSIS — R2681 Unsteadiness on feet: Secondary | ICD-10-CM

## 2020-03-29 DIAGNOSIS — M6281 Muscle weakness (generalized): Secondary | ICD-10-CM | POA: Diagnosis not present

## 2020-03-29 DIAGNOSIS — G8929 Other chronic pain: Secondary | ICD-10-CM | POA: Diagnosis not present

## 2020-03-29 DIAGNOSIS — R42 Dizziness and giddiness: Secondary | ICD-10-CM | POA: Diagnosis not present

## 2020-03-29 DIAGNOSIS — M5441 Lumbago with sciatica, right side: Secondary | ICD-10-CM | POA: Diagnosis not present
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
## 2020-03-31 ENCOUNTER — Ambulatory Visit: Payer: Medicare Other

## 2020-03-31 ENCOUNTER — Other Ambulatory Visit: Payer: Self-pay

## 2020-03-31 DIAGNOSIS — R42 Dizziness and giddiness: Secondary | ICD-10-CM | POA: Diagnosis not present

## 2020-03-31 DIAGNOSIS — R2681 Unsteadiness on feet: Secondary | ICD-10-CM | POA: Diagnosis not present

## 2020-03-31 DIAGNOSIS — G8929 Other chronic pain: Secondary | ICD-10-CM | POA: Diagnosis not present

## 2020-03-31 DIAGNOSIS — M5441 Lumbago with sciatica, right side: Secondary | ICD-10-CM | POA: Diagnosis not present

## 2020-03-31 DIAGNOSIS — R2689 Other abnormalities of gait and mobility: Secondary | ICD-10-CM

## 2020-03-31 DIAGNOSIS — M6281 Muscle weakness (generalized): Secondary | ICD-10-CM | POA: Diagnosis not present
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
## 2020-04-04 ENCOUNTER — Ambulatory Visit: Payer: Medicare Other | Admitting: Physical Therapy

## 2020-04-04 ENCOUNTER — Other Ambulatory Visit: Payer: Self-pay

## 2020-04-04 ENCOUNTER — Encounter: Payer: Self-pay | Admitting: Physical Therapy

## 2020-04-04 DIAGNOSIS — M6281 Muscle weakness (generalized): Secondary | ICD-10-CM | POA: Diagnosis not present

## 2020-04-04 DIAGNOSIS — R2689 Other abnormalities of gait and mobility: Secondary | ICD-10-CM

## 2020-04-04 DIAGNOSIS — R2681 Unsteadiness on feet: Secondary | ICD-10-CM | POA: Diagnosis not present

## 2020-04-04 DIAGNOSIS — R42 Dizziness and giddiness: Secondary | ICD-10-CM | POA: Diagnosis not present

## 2020-04-04 DIAGNOSIS — G8929 Other chronic pain: Secondary | ICD-10-CM | POA: Diagnosis not present

## 2020-04-04 DIAGNOSIS — M5441 Lumbago with sciatica, right side: Secondary | ICD-10-CM | POA: Diagnosis not present
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
## 2020-04-06 ENCOUNTER — Other Ambulatory Visit: Payer: Self-pay

## 2020-04-06 ENCOUNTER — Ambulatory Visit: Payer: Medicare Other

## 2020-04-06 DIAGNOSIS — M6281 Muscle weakness (generalized): Secondary | ICD-10-CM

## 2020-04-06 DIAGNOSIS — R2681 Unsteadiness on feet: Secondary | ICD-10-CM

## 2020-04-06 DIAGNOSIS — R2689 Other abnormalities of gait and mobility: Secondary | ICD-10-CM

## 2020-04-06 DIAGNOSIS — R42 Dizziness and giddiness: Secondary | ICD-10-CM | POA: Diagnosis not present

## 2020-04-06 DIAGNOSIS — G8929 Other chronic pain: Secondary | ICD-10-CM

## 2020-04-06 DIAGNOSIS — M5441 Lumbago with sciatica, right side: Secondary | ICD-10-CM | POA: Diagnosis not present
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
## 2020-04-11 ENCOUNTER — Other Ambulatory Visit: Payer: Self-pay

## 2020-04-11 ENCOUNTER — Encounter: Payer: Self-pay | Admitting: Physical Therapy

## 2020-04-11 ENCOUNTER — Ambulatory Visit: Payer: Medicare Other | Admitting: Physical Therapy

## 2020-04-11 DIAGNOSIS — R42 Dizziness and giddiness: Secondary | ICD-10-CM | POA: Diagnosis not present

## 2020-04-11 DIAGNOSIS — M6281 Muscle weakness (generalized): Secondary | ICD-10-CM | POA: Diagnosis not present

## 2020-04-11 DIAGNOSIS — G8929 Other chronic pain: Secondary | ICD-10-CM | POA: Diagnosis not present

## 2020-04-11 DIAGNOSIS — R2689 Other abnormalities of gait and mobility: Secondary | ICD-10-CM

## 2020-04-11 DIAGNOSIS — M5441 Lumbago with sciatica, right side: Secondary | ICD-10-CM | POA: Diagnosis not present

## 2020-04-11 DIAGNOSIS — R2681 Unsteadiness on feet: Secondary | ICD-10-CM
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
## 2020-04-11 NOTE — Patient Instructions (Signed)
    Home exercise program created by Sebastian Lurz, PT.  For questions, please contact Dannilynn Gallina via phone at [phone] or email at [email or web address]  [facility] [address]  [address] [place], [place], [address] Phone: [phone]   Fax:  [phone]    

## 2020-04-12 DIAGNOSIS — G4733 Obstructive sleep apnea (adult) (pediatric): Secondary | ICD-10-CM | POA: Diagnosis not present

## 2020-04-14 ENCOUNTER — Ambulatory Visit: Payer: Medicare Other

## 2020-04-14 ENCOUNTER — Other Ambulatory Visit: Payer: Self-pay

## 2020-04-14 DIAGNOSIS — M6281 Muscle weakness (generalized): Secondary | ICD-10-CM | POA: Diagnosis not present

## 2020-04-14 DIAGNOSIS — G8929 Other chronic pain: Secondary | ICD-10-CM | POA: Diagnosis not present

## 2020-04-14 DIAGNOSIS — M5441 Lumbago with sciatica, right side: Secondary | ICD-10-CM | POA: Diagnosis not present

## 2020-04-14 DIAGNOSIS — R2689 Other abnormalities of gait and mobility: Secondary | ICD-10-CM

## 2020-04-14 DIAGNOSIS — R2681 Unsteadiness on feet: Secondary | ICD-10-CM | POA: Diagnosis not present

## 2020-04-14 DIAGNOSIS — R42 Dizziness and giddiness: Secondary | ICD-10-CM | POA: Diagnosis not present
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
## 2020-04-18 ENCOUNTER — Other Ambulatory Visit: Payer: Self-pay

## 2020-04-18 ENCOUNTER — Ambulatory Visit: Payer: Medicare Other

## 2020-04-18 DIAGNOSIS — G8929 Other chronic pain: Secondary | ICD-10-CM

## 2020-04-18 DIAGNOSIS — M6281 Muscle weakness (generalized): Secondary | ICD-10-CM

## 2020-04-18 DIAGNOSIS — R2681 Unsteadiness on feet: Secondary | ICD-10-CM | POA: Diagnosis not present

## 2020-04-18 DIAGNOSIS — R2689 Other abnormalities of gait and mobility: Secondary | ICD-10-CM

## 2020-04-18 DIAGNOSIS — R42 Dizziness and giddiness: Secondary | ICD-10-CM | POA: Diagnosis not present

## 2020-04-18 DIAGNOSIS — M5441 Lumbago with sciatica, right side: Secondary | ICD-10-CM | POA: Diagnosis not present
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
## 2020-04-19 ENCOUNTER — Encounter: Payer: Self-pay | Admitting: Gastroenterology

## 2020-04-19 ENCOUNTER — Ambulatory Visit (INDEPENDENT_AMBULATORY_CARE_PROVIDER_SITE_OTHER): Payer: Medicare Other | Admitting: Gastroenterology

## 2020-04-19 VITALS — BP 126/76 | HR 72 | Temp 97.7°F | Ht 69.0 in | Wt 228.5 lb

## 2020-04-19 DIAGNOSIS — K219 Gastro-esophageal reflux disease without esophagitis: Secondary | ICD-10-CM | POA: Diagnosis not present

## 2020-04-19 MED ORDER — FAMOTIDINE 20 MG PO TABS: 20.0000 mg | ORAL_TABLET | Freq: Every day | ORAL | Status: DC

## 2020-04-19 MED ORDER — OMEPRAZOLE 40 MG PO CPDR: DELAYED_RELEASE_CAPSULE | ORAL | 11 refills | Status: DC
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
## 2020-04-19 NOTE — Patient Instructions (Addendum)
If you are age 70 or older, your body mass index should be between 23-30. Your Body mass index is 33.74 kg/m. If this is out of the aforementioned range listed, please consider follow up with your Primary Care Provider.  If you are age 30 or younger, your body mass index should be between 19-25. Your Body mass index is 33.74 kg/m. If this is out of the aformentioned range listed, please consider follow up with your Primary Care Provider.   You have been scheduled for an endoscopy. Please follow written instructions given to you at your visit today. If you use inhalers (even only as needed), please bring them with you on the day of your procedure.  STOP: pantoprazole  We have sent the following medications to your pharmacy for you to pick up at your convenience:  START: Omeprazole 40mg  take 1 capsule before breakfast and 1 capsule before dinner.  Please purchase the following medications over the counter and take as directed:  START: Pepcid (famotidine) 20mg  take 1 tablet every night.  Please continue blood thinner (Xarelto) and do NOT hold for the upper endoscopy.  Thank you for entrusting me with your care and choosing [facility].  Dr [name]

## 2020-04-21 DIAGNOSIS — M47816 Spondylosis without myelopathy or radiculopathy, lumbar region: Secondary | ICD-10-CM | POA: Diagnosis not present

## 2020-04-21 DIAGNOSIS — M21371 Foot drop, right foot: Secondary | ICD-10-CM | POA: Diagnosis not present

## 2020-04-21 DIAGNOSIS — G63 Polyneuropathy in diseases classified elsewhere: Secondary | ICD-10-CM | POA: Diagnosis not present

## 2020-04-21 DIAGNOSIS — M4316 Spondylolisthesis, lumbar region: Secondary | ICD-10-CM | POA: Diagnosis not present

## 2020-04-21 DIAGNOSIS — M5136 Other intervertebral disc degeneration, lumbar region: Secondary | ICD-10-CM | POA: Diagnosis not present

## 2020-04-22 ENCOUNTER — Other Ambulatory Visit: Payer: Self-pay | Admitting: Family Medicine

## 2020-04-22 DIAGNOSIS — E785 Hyperlipidemia, unspecified: Secondary | ICD-10-CM

## 2020-04-22 DIAGNOSIS — I10 Essential (primary) hypertension: Secondary | ICD-10-CM

## 2020-04-24 ENCOUNTER — Ambulatory Visit: Payer: Medicare Other | Attending: Family Medicine | Admitting: Physical Therapy

## 2020-04-24 ENCOUNTER — Encounter: Payer: Self-pay | Admitting: Physical Therapy

## 2020-04-24 ENCOUNTER — Other Ambulatory Visit: Payer: Self-pay

## 2020-04-24 DIAGNOSIS — R2681 Unsteadiness on feet: Secondary | ICD-10-CM | POA: Diagnosis not present

## 2020-04-24 DIAGNOSIS — G8929 Other chronic pain: Secondary | ICD-10-CM

## 2020-04-24 DIAGNOSIS — M5441 Lumbago with sciatica, right side: Secondary | ICD-10-CM | POA: Diagnosis not present

## 2020-04-24 DIAGNOSIS — M6281 Muscle weakness (generalized): Secondary | ICD-10-CM | POA: Insufficient documentation

## 2020-04-24 DIAGNOSIS — R2689 Other abnormalities of gait and mobility: Secondary | ICD-10-CM | POA: Diagnosis not present
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
## 2020-04-24 NOTE — Patient Instructions (Signed)
    Home exercise program created by Abbigayle Toole, PT.  For questions, please contact Katlin Bortner via phone at [phone] or email at [email or web address]   [facility] [address]  [address] [place], [place], [address] Phone: [phone]   Fax:  [phone]    

## 2020-04-26 ENCOUNTER — Ambulatory Visit: Payer: Medicare Other

## 2020-04-26 ENCOUNTER — Other Ambulatory Visit: Payer: Self-pay

## 2020-04-26 DIAGNOSIS — M6281 Muscle weakness (generalized): Secondary | ICD-10-CM | POA: Diagnosis not present

## 2020-04-26 DIAGNOSIS — M48061 Spinal stenosis, lumbar region without neurogenic claudication: Secondary | ICD-10-CM | POA: Diagnosis not present

## 2020-04-26 DIAGNOSIS — G8929 Other chronic pain: Secondary | ICD-10-CM

## 2020-04-26 DIAGNOSIS — R2689 Other abnormalities of gait and mobility: Secondary | ICD-10-CM

## 2020-04-26 DIAGNOSIS — M4316 Spondylolisthesis, lumbar region: Secondary | ICD-10-CM | POA: Diagnosis not present

## 2020-04-26 DIAGNOSIS — M5441 Lumbago with sciatica, right side: Secondary | ICD-10-CM | POA: Diagnosis not present

## 2020-04-26 DIAGNOSIS — M4317 Spondylolisthesis, lumbosacral region: Secondary | ICD-10-CM | POA: Diagnosis not present

## 2020-04-26 DIAGNOSIS — M5137 Other intervertebral disc degeneration, lumbosacral region: Secondary | ICD-10-CM | POA: Diagnosis not present

## 2020-04-26 DIAGNOSIS — M47816 Spondylosis without myelopathy or radiculopathy, lumbar region: Secondary | ICD-10-CM | POA: Diagnosis not present

## 2020-04-26 DIAGNOSIS — M5136 Other intervertebral disc degeneration, lumbar region: Secondary | ICD-10-CM | POA: Diagnosis not present

## 2020-04-26 DIAGNOSIS — R2681 Unsteadiness on feet: Secondary | ICD-10-CM | POA: Diagnosis not present
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
## 2020-05-01 ENCOUNTER — Ambulatory Visit: Payer: Medicare Other

## 2020-05-01 ENCOUNTER — Other Ambulatory Visit: Payer: Self-pay

## 2020-05-01 DIAGNOSIS — M5441 Lumbago with sciatica, right side: Secondary | ICD-10-CM

## 2020-05-01 DIAGNOSIS — R2681 Unsteadiness on feet: Secondary | ICD-10-CM

## 2020-05-01 DIAGNOSIS — R2689 Other abnormalities of gait and mobility: Secondary | ICD-10-CM

## 2020-05-01 DIAGNOSIS — G8929 Other chronic pain: Secondary | ICD-10-CM | POA: Diagnosis not present

## 2020-05-01 DIAGNOSIS — M6281 Muscle weakness (generalized): Secondary | ICD-10-CM | POA: Diagnosis not present
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
## 2020-05-04 ENCOUNTER — Encounter: Payer: Medicare Other | Admitting: Physical Therapy

## 2020-05-04 ENCOUNTER — Other Ambulatory Visit: Payer: Self-pay | Admitting: Family Medicine

## 2020-05-04 DIAGNOSIS — R413 Other amnesia: Secondary | ICD-10-CM

## 2020-05-05 ENCOUNTER — Telehealth: Payer: Self-pay | Admitting: Gastroenterology

## 2020-05-05 NOTE — Telephone Encounter (Signed)
The pt has been advised and will keep EGD appt as planned

## 2020-05-05 NOTE — Telephone Encounter (Signed)
That would actually be pretty unusual symptoms for gallbladder problems.  I do not think an ultrasound is really necessary just yet.

## 2020-05-05 NOTE — Telephone Encounter (Signed)
Dr [name] please see the note from the pt.  (husband of Bethena Roys that use to work in the [employer])

## 2020-05-08 ENCOUNTER — Other Ambulatory Visit: Payer: Self-pay

## 2020-05-08 ENCOUNTER — Encounter: Payer: Self-pay | Admitting: Family Medicine

## 2020-05-08 ENCOUNTER — Ambulatory Visit (INDEPENDENT_AMBULATORY_CARE_PROVIDER_SITE_OTHER): Payer: Medicare Other | Admitting: Family Medicine

## 2020-05-08 ENCOUNTER — Ambulatory Visit: Payer: Medicare Other

## 2020-05-08 DIAGNOSIS — M4316 Spondylolisthesis, lumbar region: Secondary | ICD-10-CM

## 2020-05-08 DIAGNOSIS — G8929 Other chronic pain: Secondary | ICD-10-CM

## 2020-05-08 DIAGNOSIS — M6281 Muscle weakness (generalized): Secondary | ICD-10-CM | POA: Diagnosis not present

## 2020-05-08 DIAGNOSIS — M5441 Lumbago with sciatica, right side: Secondary | ICD-10-CM | POA: Diagnosis not present

## 2020-05-08 DIAGNOSIS — M48062 Spinal stenosis, lumbar region with neurogenic claudication: Secondary | ICD-10-CM | POA: Diagnosis not present

## 2020-05-08 DIAGNOSIS — R2689 Other abnormalities of gait and mobility: Secondary | ICD-10-CM | POA: Diagnosis not present

## 2020-05-08 DIAGNOSIS — R2681 Unsteadiness on feet: Secondary | ICD-10-CM

## 2020-05-08 NOTE — Assessment & Plan Note (Signed)
Patient does have more of a spondylolisthesis for quite some time.  We have seen patient for this previously.  Discussed which activities to doing which wants to avoid.  Increase activity as tolerated.

## 2020-05-08 NOTE — Progress Notes (Signed)
Virtual Visit via Video Note  I connected with Garrett [name] on 05/08/20 at  4:15 PM EDT by a video enabled telemedicine application and verified that I am speaking with the correct person using two identifiers.  Location: Patient: Patient is at home alone Provider: In office   I discussed the limitations of evaluation and management by telemedicine and the availability of in person appointments. The patient expressed understanding and agreed to proceed.  History of Present Illness: 69 year old male with back pain with claudication.  Known to have spondylolisthesis with spinal stenosis.  Was taking gabapentin.  Attempted to decrease the amount of gabapentin because he was told he was taking it for peripheral neuropathy by another physician.  We had added it for his back.  Back pain has worsened.  Seen another provider.  Patient is undergoing nerve injections.  Has not had one yet.    Observations/Objective: Alert and oriented x3, asking good questions about the medication.  Had difficulty with visual platform.   Assessment and Plan: 69 year old gentleman with lumbar spinal stenosis secondary to spondylolisthesis with neurogenic claudication that has had increasing pain on this chronic problem since decreasing his gabapentin.  Encouraged him to go back to 200 mg at night and if needed and possibly increasing to 300 mg.  He had 300 mg is helpful we may need to increase the pain.  Patient is in agreement with the plan.  Follow-up at that time.   Follow Up Instructions: Patient is another provider at this time that is doing injections and we will see how this plays out.    I discussed the assessment and treatment plan with the patient. The patient was provided an opportunity to ask questions and all were answered. The patient agreed with the plan and demonstrated an understanding of the instructions.   The patient was advised to call back or seek an in-person evaluation if the symptoms worsen  or if the condition fails to improve as anticipated.  I provided 16 minutes of face-to-face time during this encounter.   Lyndal Pulley, DO
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
## 2020-05-09 ENCOUNTER — Other Ambulatory Visit: Payer: Self-pay | Admitting: Family Medicine

## 2020-05-11 ENCOUNTER — Other Ambulatory Visit: Payer: Self-pay

## 2020-05-11 ENCOUNTER — Ambulatory Visit: Payer: Medicare Other

## 2020-05-11 DIAGNOSIS — M5441 Lumbago with sciatica, right side: Secondary | ICD-10-CM | POA: Diagnosis not present

## 2020-05-11 DIAGNOSIS — R2681 Unsteadiness on feet: Secondary | ICD-10-CM

## 2020-05-11 DIAGNOSIS — R2689 Other abnormalities of gait and mobility: Secondary | ICD-10-CM | POA: Diagnosis not present

## 2020-05-11 DIAGNOSIS — M6281 Muscle weakness (generalized): Secondary | ICD-10-CM

## 2020-05-11 DIAGNOSIS — G8929 Other chronic pain: Secondary | ICD-10-CM
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
## 2020-05-15 ENCOUNTER — Other Ambulatory Visit: Payer: Self-pay

## 2020-05-15 ENCOUNTER — Ambulatory Visit: Payer: Medicare Other

## 2020-05-15 ENCOUNTER — Encounter: Payer: Self-pay | Admitting: Family Medicine

## 2020-05-15 DIAGNOSIS — M5441 Lumbago with sciatica, right side: Secondary | ICD-10-CM | POA: Diagnosis not present

## 2020-05-15 DIAGNOSIS — R2681 Unsteadiness on feet: Secondary | ICD-10-CM

## 2020-05-15 DIAGNOSIS — R2689 Other abnormalities of gait and mobility: Secondary | ICD-10-CM

## 2020-05-15 DIAGNOSIS — G8929 Other chronic pain: Secondary | ICD-10-CM

## 2020-05-15 DIAGNOSIS — M6281 Muscle weakness (generalized): Secondary | ICD-10-CM
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
## 2020-05-16 ENCOUNTER — Other Ambulatory Visit: Payer: Self-pay | Admitting: Family Medicine

## 2020-05-16 NOTE — Telephone Encounter (Signed)
I have not received anything but Dr Melvyn Novas writes it---- they may have sent it to him

## 2020-05-17 ENCOUNTER — Other Ambulatory Visit: Payer: Self-pay

## 2020-05-17 ENCOUNTER — Other Ambulatory Visit (INDEPENDENT_AMBULATORY_CARE_PROVIDER_SITE_OTHER): Payer: Medicare Other

## 2020-05-17 DIAGNOSIS — E1169 Type 2 diabetes mellitus with other specified complication: Secondary | ICD-10-CM | POA: Diagnosis not present

## 2020-05-17 DIAGNOSIS — E785 Hyperlipidemia, unspecified: Secondary | ICD-10-CM

## 2020-05-17 DIAGNOSIS — E1165 Type 2 diabetes mellitus with hyperglycemia: Secondary | ICD-10-CM

## 2020-05-17 LAB — COMPREHENSIVE METABOLIC PANEL
ALT: 43 U/L (ref 0–53)
AST: 25 U/L (ref 0–37)
Albumin: 4.3 g/dL (ref 3.5–5.2)
Alkaline Phosphatase: 51 U/L (ref 39–117)
BUN: 22 mg/dL (ref 6–23)
CO2: 22 mEq/L (ref 19–32)
Calcium: 9.6 mg/dL (ref 8.4–10.5)
Chloride: 110 mEq/L (ref 96–112)
Creatinine, Ser: 1.3 mg/dL (ref 0.40–1.50)
GFR: 54.56 mL/min — ABNORMAL LOW (ref >60.00)
Glucose, Bld: 125 mg/dL — ABNORMAL HIGH (ref 70–99)
Potassium: 3.7 mEq/L (ref 3.5–5.1)
Sodium: 142 mEq/L (ref 135–145)
Total Bilirubin: 0.3 mg/dL (ref 0.2–1.2)
Total Protein: 6.6 g/dL (ref 6.0–8.3)

## 2020-05-17 LAB — LIPID PANEL
Cholesterol: 172 mg/dL (ref 0–200)
HDL: 41.4 mg/dL (ref >39.00)
LDL Cholesterol: 93 mg/dL (ref 0–99)
NonHDL: 131.09
Total CHOL/HDL Ratio: 4
Triglycerides: 189 mg/dL — ABNORMAL HIGH (ref 0.0–149.0)
VLDL: 37.8 mg/dL (ref 0.0–40.0)

## 2020-05-17 LAB — HEMOGLOBIN A1C: Hgb A1c MFr Bld: 6.4 % (ref 4.6–6.5)

## 2020-05-18 ENCOUNTER — Ambulatory Visit: Payer: Medicare Other | Admitting: Physical Therapy

## 2020-05-18 VITALS — BP 148/82 | HR 82

## 2020-05-18 DIAGNOSIS — R2681 Unsteadiness on feet: Secondary | ICD-10-CM | POA: Diagnosis not present

## 2020-05-18 DIAGNOSIS — M5441 Lumbago with sciatica, right side: Secondary | ICD-10-CM | POA: Diagnosis not present

## 2020-05-18 DIAGNOSIS — M6281 Muscle weakness (generalized): Secondary | ICD-10-CM | POA: Diagnosis not present

## 2020-05-18 DIAGNOSIS — R2689 Other abnormalities of gait and mobility: Secondary | ICD-10-CM

## 2020-05-18 DIAGNOSIS — G8929 Other chronic pain: Secondary | ICD-10-CM
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
## 2020-05-23 ENCOUNTER — Encounter: Payer: Self-pay | Admitting: Physical Therapy

## 2020-05-23 ENCOUNTER — Other Ambulatory Visit: Payer: Self-pay

## 2020-05-23 ENCOUNTER — Ambulatory Visit: Payer: Medicare Other | Attending: Family Medicine | Admitting: Physical Therapy

## 2020-05-23 DIAGNOSIS — M6281 Muscle weakness (generalized): Secondary | ICD-10-CM

## 2020-05-23 DIAGNOSIS — R2681 Unsteadiness on feet: Secondary | ICD-10-CM

## 2020-05-23 DIAGNOSIS — R42 Dizziness and giddiness: Secondary | ICD-10-CM | POA: Diagnosis not present

## 2020-05-23 DIAGNOSIS — R2689 Other abnormalities of gait and mobility: Secondary | ICD-10-CM

## 2020-05-23 DIAGNOSIS — M5441 Lumbago with sciatica, right side: Secondary | ICD-10-CM | POA: Insufficient documentation

## 2020-05-23 DIAGNOSIS — G8929 Other chronic pain: Secondary | ICD-10-CM
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
## 2020-05-23 NOTE — Patient Instructions (Signed)
    Home exercise program created by Quynh Basso, PT.  For questions, please contact Lasheka Kempner via phone at [phone] or email at [email or web address]  [facility] [address]  [address] [place], [place], [address] Phone: [phone]   Fax:  [phone]    

## 2020-05-25 ENCOUNTER — Other Ambulatory Visit: Payer: Self-pay

## 2020-05-25 ENCOUNTER — Ambulatory Visit: Payer: Medicare Other

## 2020-05-25 DIAGNOSIS — R2689 Other abnormalities of gait and mobility: Secondary | ICD-10-CM | POA: Diagnosis not present

## 2020-05-25 DIAGNOSIS — M5441 Lumbago with sciatica, right side: Secondary | ICD-10-CM

## 2020-05-25 DIAGNOSIS — G8929 Other chronic pain: Secondary | ICD-10-CM

## 2020-05-25 DIAGNOSIS — R2681 Unsteadiness on feet: Secondary | ICD-10-CM | POA: Diagnosis not present

## 2020-05-25 DIAGNOSIS — R42 Dizziness and giddiness: Secondary | ICD-10-CM | POA: Diagnosis not present

## 2020-05-25 DIAGNOSIS — M6281 Muscle weakness (generalized): Secondary | ICD-10-CM | POA: Diagnosis not present
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
## 2020-05-26 ENCOUNTER — Encounter: Payer: Self-pay | Admitting: Gastroenterology

## 2020-05-29 ENCOUNTER — Encounter: Payer: Medicare Other | Admitting: Physical Therapy

## 2020-06-01 ENCOUNTER — Ambulatory Visit: Payer: Medicare Other

## 2020-06-01 ENCOUNTER — Other Ambulatory Visit: Payer: Self-pay

## 2020-06-01 DIAGNOSIS — M6281 Muscle weakness (generalized): Secondary | ICD-10-CM

## 2020-06-01 DIAGNOSIS — R42 Dizziness and giddiness: Secondary | ICD-10-CM

## 2020-06-01 DIAGNOSIS — G8929 Other chronic pain: Secondary | ICD-10-CM | POA: Diagnosis not present

## 2020-06-01 DIAGNOSIS — R2689 Other abnormalities of gait and mobility: Secondary | ICD-10-CM

## 2020-06-01 DIAGNOSIS — R2681 Unsteadiness on feet: Secondary | ICD-10-CM | POA: Diagnosis not present

## 2020-06-01 DIAGNOSIS — M5441 Lumbago with sciatica, right side: Secondary | ICD-10-CM | POA: Diagnosis not present
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
## 2020-06-02 ENCOUNTER — Other Ambulatory Visit: Payer: Self-pay

## 2020-06-02 ENCOUNTER — Ambulatory Visit (AMBULATORY_SURGERY_CENTER): Payer: Medicare Other | Admitting: Gastroenterology

## 2020-06-02 ENCOUNTER — Encounter: Payer: Self-pay | Admitting: Gastroenterology

## 2020-06-02 VITALS — BP 131/65 | HR 58 | Temp 97.5°F | Resp 17 | Ht 69.0 in | Wt 228.0 lb

## 2020-06-02 DIAGNOSIS — K2970 Gastritis, unspecified, without bleeding: Secondary | ICD-10-CM | POA: Diagnosis not present

## 2020-06-02 DIAGNOSIS — K219 Gastro-esophageal reflux disease without esophagitis: Secondary | ICD-10-CM

## 2020-06-02 DIAGNOSIS — K2950 Unspecified chronic gastritis without bleeding: Secondary | ICD-10-CM | POA: Diagnosis not present

## 2020-06-02 DIAGNOSIS — K449 Diaphragmatic hernia without obstruction or gangrene: Secondary | ICD-10-CM | POA: Diagnosis not present

## 2020-06-02 MED ORDER — SODIUM CHLORIDE 0.9 % IV SOLN: 500.0000 mL | Freq: Once | INTRAVENOUS | Status: DC

## 2020-06-02 NOTE — Progress Notes (Addendum)
Pt's states no medical or surgical changes since previsit or office visit.  Vitals CW  Pt was not instructed to stop Xarelto, pt took Xarelto at 6AM, Dr [name] aware and ok to proceed with the procedure.

## 2020-06-02 NOTE — Progress Notes (Signed)
Called to room to assist during endoscopic procedure.  Patient ID and intended procedure confirmed with present staff. Received instructions for my participation in the procedure from the performing physician.  

## 2020-06-02 NOTE — Progress Notes (Signed)
pt tolerated well. VSS. awake and to recovery. Report given to RN.  

## 2020-06-02 NOTE — Patient Instructions (Signed)
Handout provided on gastritis.  Continue present medications. Continue the Omeprazole twice daily (before breakfast and dinner meals) and take Pepcid at bedtime every night.   YOU HAD AN ENDOSCOPIC PROCEDURE TODAY AT [facility]:   Refer to the procedure report that was given to you for any specific questions about what was found during the examination.  If the procedure report does not answer your questions, please call your gastroenterologist to clarify.  If you requested that your care partner not be given the details of your procedure findings, then the procedure report has been included in a sealed envelope for you to review at your convenience later.  YOU SHOULD EXPECT: Some feelings of bloating in the abdomen. Passage of more gas than usual.  Walking can help get rid of the air that was put into your GI tract during the procedure and reduce the bloating. If you had a lower endoscopy (such as a colonoscopy or flexible sigmoidoscopy) you may notice spotting of blood in your stool or on the toilet paper. If you underwent a bowel prep for your procedure, you may not have a normal bowel movement for a few days.  Please Note:  You might notice some irritation and congestion in your nose or some drainage.  This is from the oxygen used during your procedure.  There is no need for concern and it should clear up in a day or so.  SYMPTOMS TO REPORT IMMEDIATELY:   Following upper endoscopy (EGD)  Vomiting of blood or coffee ground material  New chest pain or pain under the shoulder blades  Painful or persistently difficult swallowing  New shortness of breath  Fever of 100F or higher  Black, tarry-looking stools  For urgent or emergent issues, a gastroenterologist can be reached at any hour by calling [phone]. Do not use MyChart messaging for urgent concerns.    DIET:  We do recommend a small meal at first, but then you may proceed to your regular diet.  Drink plenty of  fluids but you should avoid alcoholic beverages for 24 hours.  ACTIVITY:  You should plan to take it easy for the rest of today and you should NOT DRIVE or use heavy machinery until tomorrow (because of the sedation medicines used during the test).    FOLLOW UP: Our staff will call the number listed on your records 48-72 hours following your procedure to check on you and address any questions or concerns that you may have regarding the information given to you following your procedure. If we do not reach you, we will leave a message.  We will attempt to reach you two times.  During this call, we will ask if you have developed any symptoms of COVID 19. If you develop any symptoms (ie: fever, flu-like symptoms, shortness of breath, cough etc.) before then, please call [phone].  If you test positive for Covid 19 in the 2 weeks post procedure, please call and report this information to [place].    If any biopsies were taken you will be contacted by phone or by letter within the next 1-3 weeks.  Please call us at [phone] if you have not heard about the biopsies in 3 weeks.    SIGNATURES/CONFIDENTIALITY: You and/or your care partner have signed paperwork which will be entered into your electronic medical record.  These signatures attest to the fact that that the information above on your After Visit Summary has been reviewed and is understood.  Full responsibility  of the confidentiality of this discharge information lies with you and/or your care-partner.

## 2020-06-02 NOTE — Op Note (Signed)
[facility] Patient Name: Garrett [name] Procedure Date: 06/02/2020 10:45 AM MRN: [id number] Endoscopist: Milus Banister , MD Age: 70 Referring MD:  Date of Birth: January 14, 1951 Gender: Male Account #: [id number] Procedure:                Upper GI endoscopy Indications:              Dyspepsia, Heartburn Medicines:                Monitored Anesthesia Care Procedure:                Pre-Anesthesia Assessment:                           - Prior to the procedure, a History and Physical                            was performed, and patient medications and                            allergies were reviewed. The patient's tolerance of                            previous anesthesia was also reviewed. The risks                            and benefits of the procedure and the sedation                            options and risks were discussed with the patient.                            All questions were answered, and informed consent                            was obtained. Prior Anticoagulants: The patient has                            taken Xarelto (rivaroxaban), last dose was day of                            procedure. ASA Grade Assessment: III - A patient                            with severe systemic disease. After reviewing the                            risks and benefits, the patient was deemed in                            satisfactory condition to undergo the procedure.                           After obtaining informed consent, the endoscope was  passed under direct vision. Throughout the                            procedure, the patient's blood pressure, pulse, and                            oxygen saturations were monitored continuously. The                            Endoscope was introduced through the mouth, and                            advanced to the second part of duodenum. The upper                            GI endoscopy was  accomplished without difficulty.                            The patient tolerated the procedure well. Scope In: Scope Out: Findings:                 Abnormal proximal stomach related to previous                            fundoplication and redo fundoplication. Suggestion                            of hiatal hernia (plication within the hernia                            segment). There was a small amount of food proximal                            to the fundoplication, this was flushed and                            suctioned clear.                           Mild inflammation characterized by erythema and                            granularity was found in the gastric antrum.                            Biopsies were taken with a cold forceps for                            histology.                           The exam was otherwise without abnormality. Complications:            No immediate complications. Estimated blood loss:  None. Estimated Blood Loss:     Estimated blood loss: none. Impression:               - Abnormal proximal stomach related to previous                            fundoplication and redo fundoplication. Suggestion                            of hiatal hernia (plication within the hernia                            segment). There was a small amount of food proximal                            to the fundoplication, this was flushed and                            suctioned clear.                           - Mild gastritis, biopsied to check for H. pylori.                           - The examination was otherwise normal. Recommendation:           - Patient has a contact number available for                            emergencies. The signs and symptoms of potential                            delayed complications were discussed with the                            patient. Return to normal activities tomorrow.                            Written  discharge instructions were provided to the                            patient.                           - Resume previous diet.                           - Continue present medications. Continue the                            omeprazole twice daily (before breakfast and dinner                            meals) and take pepcid at bedtime every night.                           -  Await pathology results. Pending these results,                            you will likely be scheduled for UGI evaluation of                            the esopahgus/gastric anatomy. Milus Banister, MD 06/02/2020 11:04:21 AM This report has been signed electronically.

## 2020-06-05 ENCOUNTER — Other Ambulatory Visit: Payer: Self-pay

## 2020-06-05 ENCOUNTER — Ambulatory Visit: Payer: Medicare Other

## 2020-06-05 DIAGNOSIS — G8929 Other chronic pain: Secondary | ICD-10-CM | POA: Diagnosis not present

## 2020-06-05 DIAGNOSIS — M6281 Muscle weakness (generalized): Secondary | ICD-10-CM

## 2020-06-05 DIAGNOSIS — R42 Dizziness and giddiness: Secondary | ICD-10-CM | POA: Diagnosis not present

## 2020-06-05 DIAGNOSIS — R2689 Other abnormalities of gait and mobility: Secondary | ICD-10-CM | POA: Diagnosis not present

## 2020-06-05 DIAGNOSIS — M5441 Lumbago with sciatica, right side: Secondary | ICD-10-CM | POA: Diagnosis not present

## 2020-06-05 DIAGNOSIS — R2681 Unsteadiness on feet: Secondary | ICD-10-CM | POA: Diagnosis not present
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
## 2020-06-06 ENCOUNTER — Telehealth: Payer: Self-pay

## 2020-06-06 NOTE — Telephone Encounter (Signed)
  Follow up Call-  Call back number 06/02/2020  Post procedure Call Back phone  # [phone]  Permission to leave phone message Yes  Some recent data might be hidden     Patient questions:  Do you have a fever, pain , or abdominal swelling? No. Pain Score  0 *  Have you tolerated food without any problems? Yes.    Have you been able to return to your normal activities? Yes.    Do you have any questions about your discharge instructions: Diet   No. Medications  No. Follow up visit  No.  Do you have questions or concerns about your Care? No.  Actions: * If pain score is 4 or above: No action needed, pain <4.  1. Have you developed a fever since your procedure? No  2.   Have you had an respiratory symptoms (SOB or cough) since your procedure? No  3.   Have you tested positive for COVID 19 since your procedure No  4.   Have you had any family members/close contacts diagnosed with the COVID 19 since your procedure?  No  If yes to any of these questions please route to Joylene John, RN and Erenest Rasher, RN

## 2020-06-07 ENCOUNTER — Ambulatory Visit: Payer: Medicare Other | Admitting: Orthotics

## 2020-06-07 ENCOUNTER — Other Ambulatory Visit: Payer: Self-pay

## 2020-06-07 DIAGNOSIS — M2042 Other hammer toe(s) (acquired), left foot: Secondary | ICD-10-CM | POA: Diagnosis not present

## 2020-06-07 DIAGNOSIS — E1140 Type 2 diabetes mellitus with diabetic neuropathy, unspecified: Secondary | ICD-10-CM | POA: Diagnosis not present

## 2020-06-07 DIAGNOSIS — Z8719 Personal history of other diseases of the digestive system: Secondary | ICD-10-CM

## 2020-06-07 DIAGNOSIS — M2041 Other hammer toe(s) (acquired), right foot: Secondary | ICD-10-CM | POA: Diagnosis not present

## 2020-06-08 ENCOUNTER — Ambulatory Visit: Payer: Medicare Other

## 2020-06-08 DIAGNOSIS — M6281 Muscle weakness (generalized): Secondary | ICD-10-CM

## 2020-06-08 DIAGNOSIS — G8929 Other chronic pain: Secondary | ICD-10-CM

## 2020-06-08 DIAGNOSIS — R42 Dizziness and giddiness: Secondary | ICD-10-CM | POA: Diagnosis not present

## 2020-06-08 DIAGNOSIS — M5441 Lumbago with sciatica, right side: Secondary | ICD-10-CM | POA: Diagnosis not present

## 2020-06-08 DIAGNOSIS — R2681 Unsteadiness on feet: Secondary | ICD-10-CM

## 2020-06-08 DIAGNOSIS — R2689 Other abnormalities of gait and mobility: Secondary | ICD-10-CM | POA: Diagnosis not present
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
## 2020-06-12 ENCOUNTER — Other Ambulatory Visit: Payer: Self-pay

## 2020-06-12 ENCOUNTER — Encounter: Payer: Self-pay | Admitting: Physical Therapy

## 2020-06-12 ENCOUNTER — Ambulatory Visit: Payer: Medicare Other | Admitting: Physical Therapy

## 2020-06-12 DIAGNOSIS — G8929 Other chronic pain: Secondary | ICD-10-CM | POA: Diagnosis not present

## 2020-06-12 DIAGNOSIS — M6281 Muscle weakness (generalized): Secondary | ICD-10-CM

## 2020-06-12 DIAGNOSIS — R2689 Other abnormalities of gait and mobility: Secondary | ICD-10-CM | POA: Diagnosis not present

## 2020-06-12 DIAGNOSIS — M5136 Other intervertebral disc degeneration, lumbar region: Secondary | ICD-10-CM | POA: Diagnosis not present

## 2020-06-12 DIAGNOSIS — R42 Dizziness and giddiness: Secondary | ICD-10-CM | POA: Diagnosis not present

## 2020-06-12 DIAGNOSIS — M5441 Lumbago with sciatica, right side: Secondary | ICD-10-CM | POA: Diagnosis not present

## 2020-06-12 DIAGNOSIS — R2681 Unsteadiness on feet: Secondary | ICD-10-CM

## 2020-06-12 DIAGNOSIS — M48061 Spinal stenosis, lumbar region without neurogenic claudication: Secondary | ICD-10-CM | POA: Diagnosis not present

## 2020-06-12 DIAGNOSIS — M4726 Other spondylosis with radiculopathy, lumbar region: Secondary | ICD-10-CM | POA: Diagnosis not present
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
## 2020-06-12 NOTE — Patient Instructions (Signed)
° °  [place] Fall Prevention Program   Home exercise program created by Annie Paras, PT.  For questions, please contact Rasheena Talmadge via phone at [phone] or email at [email or web address]  [facility] [address]  [place], [place], [place] Phone: [phone]   Fax:  [phone]

## 2020-06-13 ENCOUNTER — Other Ambulatory Visit: Payer: Self-pay | Admitting: Family Medicine

## 2020-06-13 ENCOUNTER — Other Ambulatory Visit: Payer: Self-pay

## 2020-06-13 DIAGNOSIS — M1000 Idiopathic gout, unspecified site: Secondary | ICD-10-CM

## 2020-06-13 DIAGNOSIS — I10 Essential (primary) hypertension: Secondary | ICD-10-CM

## 2020-06-13 MED ORDER — OMEPRAZOLE 40 MG PO CPDR: DELAYED_RELEASE_CAPSULE | ORAL | 3 refills | Status: DC

## 2020-06-15 ENCOUNTER — Ambulatory Visit (HOSPITAL_COMMUNITY)
Admission: RE | Admit: 2020-06-15 | Discharge: 2020-06-15 | Disposition: A | Discharge status: Home / Self Care | Payer: Medicare Other | Source: Ambulatory Visit | Attending: Gastroenterology | Admitting: Gastroenterology

## 2020-06-15 ENCOUNTER — Other Ambulatory Visit: Payer: Self-pay

## 2020-06-15 DIAGNOSIS — Z8719 Personal history of other diseases of the digestive system: Secondary | ICD-10-CM | POA: Diagnosis not present

## 2020-06-15 DIAGNOSIS — K449 Diaphragmatic hernia without obstruction or gangrene: Secondary | ICD-10-CM | POA: Diagnosis not present

## 2020-06-15 DIAGNOSIS — K219 Gastro-esophageal reflux disease without esophagitis: Secondary | ICD-10-CM | POA: Diagnosis not present

## 2020-06-27 ENCOUNTER — Encounter: Payer: Self-pay | Admitting: Neurology

## 2020-06-27 ENCOUNTER — Other Ambulatory Visit: Payer: Self-pay

## 2020-06-27 ENCOUNTER — Ambulatory Visit: Payer: Medicare Other | Admitting: Neurology

## 2020-06-27 VITALS — BP 147/86 | HR 71 | Ht 69.0 in | Wt 221.6 lb

## 2020-06-27 DIAGNOSIS — R2689 Other abnormalities of gait and mobility: Secondary | ICD-10-CM | POA: Diagnosis not present

## 2020-06-27 DIAGNOSIS — G43109 Migraine with aura, not intractable, without status migrainosus: Secondary | ICD-10-CM

## 2020-06-27 DIAGNOSIS — G629 Polyneuropathy, unspecified: Secondary | ICD-10-CM | POA: Diagnosis not present

## 2020-06-27 MED ORDER — TOPIRAMATE 50 MG PO TABS: 50.0000 mg | ORAL_TABLET | Freq: Two times a day (BID) | ORAL | 3 refills | Status: DC

## 2020-06-27 NOTE — Patient Instructions (Signed)
Great to see you! Continue Topamax 50mg  twice a day and as needed Imitrex. Continue follow-up with your other physicians, follow-up with me in 1 year, call for any changes.
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
## 2020-06-28 ENCOUNTER — Encounter: Payer: Self-pay | Admitting: Family Medicine

## 2020-06-28 DIAGNOSIS — D500 Iron deficiency anemia secondary to blood loss (chronic): Secondary | ICD-10-CM

## 2020-06-28 DIAGNOSIS — I2699 Other pulmonary embolism without acute cor pulmonale: Secondary | ICD-10-CM

## 2020-06-28 MED ORDER — PROAIR HFA 108 (90 BASE) MCG/ACT IN AERS: 1.0000 | INHALATION_SPRAY | Freq: Four times a day (QID) | RESPIRATORY_TRACT | 3 refills | Status: DC | PRN

## 2020-06-29 DIAGNOSIS — L814 Other melanin hyperpigmentation: Secondary | ICD-10-CM | POA: Diagnosis not present

## 2020-06-29 DIAGNOSIS — X32XXXS Exposure to sunlight, sequela: Secondary | ICD-10-CM | POA: Diagnosis not present

## 2020-06-29 DIAGNOSIS — L821 Other seborrheic keratosis: Secondary | ICD-10-CM | POA: Diagnosis not present

## 2020-06-29 DIAGNOSIS — L570 Actinic keratosis: Secondary | ICD-10-CM | POA: Diagnosis not present

## 2020-07-10 ENCOUNTER — Ambulatory Visit: Payer: Medicare Other | Admitting: Physical Therapy

## 2020-07-12 DIAGNOSIS — K449 Diaphragmatic hernia without obstruction or gangrene: Secondary | ICD-10-CM | POA: Diagnosis not present

## 2020-07-12 DIAGNOSIS — G4733 Obstructive sleep apnea (adult) (pediatric): Secondary | ICD-10-CM | POA: Diagnosis not present

## 2020-07-14 ENCOUNTER — Other Ambulatory Visit: Payer: Self-pay | Admitting: General Surgery

## 2020-07-14 ENCOUNTER — Other Ambulatory Visit (HOSPITAL_COMMUNITY): Payer: Self-pay | Admitting: General Surgery

## 2020-07-14 DIAGNOSIS — M5416 Radiculopathy, lumbar region: Secondary | ICD-10-CM | POA: Diagnosis not present

## 2020-07-14 DIAGNOSIS — K449 Diaphragmatic hernia without obstruction or gangrene: Secondary | ICD-10-CM

## 2020-07-14 DIAGNOSIS — M5136 Other intervertebral disc degeneration, lumbar region: Secondary | ICD-10-CM | POA: Diagnosis not present

## 2020-07-14 DIAGNOSIS — M4316 Spondylolisthesis, lumbar region: Secondary | ICD-10-CM | POA: Diagnosis not present

## 2020-07-14 DIAGNOSIS — M47816 Spondylosis without myelopathy or radiculopathy, lumbar region: Secondary | ICD-10-CM | POA: Diagnosis not present

## 2020-07-14 DIAGNOSIS — M21371 Foot drop, right foot: Secondary | ICD-10-CM | POA: Diagnosis not present

## 2020-07-19 ENCOUNTER — Telehealth: Payer: Self-pay | Admitting: Internal Medicine

## 2020-07-19 NOTE — Telephone Encounter (Signed)
The form is in Dr Gustavus Bryant lookat in [place]- will call [facility] 7/29 as they are closed now

## 2020-07-20 NOTE — Telephone Encounter (Signed)
Called [facility] and spoke with Abigail Butts letting her know that we received form and will address when MW back in office on 07/24/20

## 2020-07-26 ENCOUNTER — Telehealth: Payer: Self-pay | Admitting: Internal Medicine

## 2020-07-26 NOTE — Telephone Encounter (Signed)
Form faxed to [phone]

## 2020-07-26 NOTE — Telephone Encounter (Signed)
Unable to document in previous encounter.  Will continue documentation in this encounter.  April- [facility] Dr. Francesco Runner to [place]  [facility]  07/26/20 2:09 PM see previous note-- when wert faxed back he included pt needed lovenox bridge-- april called back, says they do not do those on their side-- wants to let us knwo its something we need to handle with pt.   Dr. Melvyn Novas,  We received a message back from April at [facility], Dr. Isabelle Course office stating that they received the fax from Dr. Melvyn Novas regarding patient needing a lovenol bridge.  They do not do those on their side and want him to know that our office will have to handle that with the patient.   Please advise.  Thank you.

## 2020-07-26 NOTE — Telephone Encounter (Signed)
April- [facility] Dr. Francesco Runner to [place]   [facility]  07/26/20 2:09 PM see previous note-- when wert faxed back he included pt needed lovenox bridge-- april called back, says they do not do those on their side-- wants to let us knwo its something we need to handle with pt.

## 2020-07-26 NOTE — Telephone Encounter (Signed)
lovenox is given post op once hemostasis is achieved   If they are not comfortable with the dosing then can just start back on xarelto without a loading dose once hemostasis is achieved  Perioperatively should be in PAS hose

## 2020-07-26 NOTE — Telephone Encounter (Signed)
Closing this encounter as a previous encounter was already open regarding the same matter, information from this encounter copied and pasted to the previous encounter.  Nothing further needed.

## 2020-07-26 NOTE — Telephone Encounter (Signed)
Form received and was placed in Dr Gustavus Bryant lookat to be signed, thanks

## 2020-07-26 NOTE — Telephone Encounter (Signed)
Says ok to hold xarelto but will need lovenox bridge post op once hemostasis is achieved

## 2020-07-27 ENCOUNTER — Telehealth: Payer: Self-pay | Admitting: Internal Medicine

## 2020-07-27 ENCOUNTER — Ambulatory Visit (HOSPITAL_COMMUNITY): Payer: Medicare Other

## 2020-07-27 NOTE — Telephone Encounter (Signed)
I have already responded to this - if not able to use lovenox then resume xarelto without loading dose as soon as hemostasis is achieved

## 2020-07-27 NOTE — Telephone Encounter (Signed)
[facility] calling with message for Dr. Melvyn Novas. Please advise how triage can assist.  pt was given surgical clearnce and ok to stop xarelto for 5 days with lovenox bridge. [facility] calling to inform they are unable to manage lovenox brige, that is something our office is responsible for.

## 2020-07-31 ENCOUNTER — Ambulatory Visit
Admission: RE | Admit: 2020-07-31 | Discharge: 2020-07-31 | Disposition: A | Discharge status: Home / Self Care | Payer: Medicare Other | Source: Ambulatory Visit | Attending: General Surgery | Admitting: General Surgery

## 2020-07-31 DIAGNOSIS — M47816 Spondylosis without myelopathy or radiculopathy, lumbar region: Secondary | ICD-10-CM | POA: Diagnosis not present

## 2020-07-31 DIAGNOSIS — K449 Diaphragmatic hernia without obstruction or gangrene: Secondary | ICD-10-CM | POA: Diagnosis not present

## 2020-07-31 DIAGNOSIS — K8020 Calculus of gallbladder without cholecystitis without obstruction: Secondary | ICD-10-CM | POA: Diagnosis not present

## 2020-07-31 DIAGNOSIS — I2510 Atherosclerotic heart disease of native coronary artery without angina pectoris: Secondary | ICD-10-CM | POA: Diagnosis not present

## 2020-07-31 DIAGNOSIS — I700 Atherosclerosis of aorta: Secondary | ICD-10-CM | POA: Diagnosis not present

## 2020-08-01 DIAGNOSIS — M5136 Other intervertebral disc degeneration, lumbar region: Secondary | ICD-10-CM | POA: Diagnosis not present

## 2020-08-02 ENCOUNTER — Encounter (HOSPITAL_COMMUNITY)
Admission: RE | Admit: 2020-08-02 | Discharge: 2020-08-02 | Disposition: A | Discharge status: Home / Self Care | Payer: Medicare Other | Source: Ambulatory Visit | Attending: General Surgery | Admitting: General Surgery

## 2020-08-02 ENCOUNTER — Other Ambulatory Visit: Payer: Self-pay

## 2020-08-02 DIAGNOSIS — R110 Nausea: Secondary | ICD-10-CM | POA: Diagnosis not present

## 2020-08-02 DIAGNOSIS — K449 Diaphragmatic hernia without obstruction or gangrene: Secondary | ICD-10-CM | POA: Diagnosis not present

## 2020-08-02 DIAGNOSIS — K30 Functional dyspepsia: Secondary | ICD-10-CM | POA: Diagnosis not present

## 2020-08-02 MED ORDER — TECHNETIUM TC 99M SULFUR COLLOID
2.0000 | Freq: Once | INTRAVENOUS | Status: AC | PRN
  Administered 2020-08-02: 2 via INTRAVENOUS

## 2020-08-17 ENCOUNTER — Other Ambulatory Visit: Payer: Self-pay

## 2020-08-17 ENCOUNTER — Ambulatory Visit (INDEPENDENT_AMBULATORY_CARE_PROVIDER_SITE_OTHER): Payer: Medicare Other | Admitting: Family Medicine

## 2020-08-17 ENCOUNTER — Encounter: Payer: Self-pay | Admitting: Family Medicine

## 2020-08-17 VITALS — BP 126/80 | HR 68 | Temp 98.4°F | Resp 18 | Ht 69.0 in | Wt 218.4 lb

## 2020-08-17 DIAGNOSIS — I2699 Other pulmonary embolism without acute cor pulmonale: Secondary | ICD-10-CM | POA: Diagnosis not present

## 2020-08-17 DIAGNOSIS — M159 Polyosteoarthritis, unspecified: Secondary | ICD-10-CM

## 2020-08-17 DIAGNOSIS — E1169 Type 2 diabetes mellitus with other specified complication: Secondary | ICD-10-CM | POA: Diagnosis not present

## 2020-08-17 DIAGNOSIS — E1165 Type 2 diabetes mellitus with hyperglycemia: Secondary | ICD-10-CM | POA: Diagnosis not present

## 2020-08-17 DIAGNOSIS — I10 Essential (primary) hypertension: Secondary | ICD-10-CM | POA: Diagnosis not present

## 2020-08-17 DIAGNOSIS — D500 Iron deficiency anemia secondary to blood loss (chronic): Secondary | ICD-10-CM | POA: Diagnosis not present

## 2020-08-17 DIAGNOSIS — E785 Hyperlipidemia, unspecified: Secondary | ICD-10-CM | POA: Diagnosis not present

## 2020-08-17 DIAGNOSIS — J42 Unspecified chronic bronchitis: Secondary | ICD-10-CM

## 2020-08-17 MED ORDER — FLURBIPROFEN 100 MG PO TABS: 100.0000 mg | ORAL_TABLET | Freq: Two times a day (BID) | ORAL | 3 refills | Status: DC

## 2020-08-17 MED ORDER — TRELEGY ELLIPTA 100-62.5-25 MCG/INH IN AEPB: INHALATION_SPRAY | RESPIRATORY_TRACT | 3 refills | Status: DC

## 2020-08-17 NOTE — Patient Instructions (Addendum)
Carbohydrate Counting for Diabetes Mellitus, Adult  Carbohydrate counting is a method of keeping track of how many carbohydrates you eat. Eating carbohydrates naturally increases the amount of sugar (glucose) in the blood. Counting how many carbohydrates you eat helps keep your blood glucose within normal limits, which helps you manage your diabetes (diabetes mellitus). It is important to know how many carbohydrates you can safely have in each meal. This is different for every person. A diet and nutrition specialist (registered dietitian) can help you make a meal plan and calculate how many carbohydrates you should have at each meal and snack. Carbohydrates are found in the following foods:  Grains, such as breads and cereals.  Dried beans and soy products.  Starchy vegetables, such as potatoes, peas, and corn.  Fruit and fruit juices.  Milk and yogurt.  Sweets and snack foods, such as cake, cookies, candy, chips, and soft drinks. How do I count carbohydrates? There are two ways to count carbohydrates in food. You can use either of the methods or a combination of both. Reading "Nutrition Facts" on packaged food The "Nutrition Facts" list is included on the labels of almost all packaged foods and beverages in the [place] It includes:  The serving size.  Information about nutrients in each serving, including the grams (g) of carbohydrate per serving. To use the "Nutrition Facts":  Decide how many servings you will have.  Multiply the number of servings by the number of carbohydrates per serving.  The resulting number is the total amount of carbohydrates that you will be having. Learning standard serving sizes of other foods When you eat carbohydrate foods that are not packaged or do not include "Nutrition Facts" on the label, you need to measure the servings in order to count the amount of carbohydrates:  Measure the foods that you will eat with a food scale or measuring cup, if  needed.  Decide how many standard-size servings you will eat.  Multiply the number of servings by 15. Most carbohydrate-rich foods have about 15 g of carbohydrates per serving. ? For example, if you eat 8 oz (170 g) of strawberries, you will have eaten 2 servings and 30 g of carbohydrates (2 servings x 15 g = 30 g).  For foods that have more than one food mixed, such as soups and casseroles, you must count the carbohydrates in each food that is included. The following list contains standard serving sizes of common carbohydrate-rich foods. Each of these servings has about 15 g of carbohydrates:   hamburger bun or  English muffin.   oz (15 mL) syrup.   oz (14 g) jelly.  1 slice of bread.  1 six-inch tortilla.  3 oz (85 g) cooked rice or pasta.  4 oz (113 g) cooked dried beans.  4 oz (113 g) starchy vegetable, such as peas, corn, or potatoes.  4 oz (113 g) hot cereal.  4 oz (113 g) mashed potatoes or  of a large baked potato.  4 oz (113 g) canned or frozen fruit.  4 oz (120 mL) fruit juice.  4-6 crackers.  6 chicken nuggets.  6 oz (170 g) unsweetened dry cereal.  6 oz (170 g) plain fat-free yogurt or yogurt sweetened with artificial sweeteners.  8 oz (240 mL) milk.  8 oz (170 g) fresh fruit or one small piece of fruit.  24 oz (680 g) popped popcorn. Example of carbohydrate counting Sample meal  3 oz (85 g) chicken breast.  6 oz (170 g)   brown rice.  4 oz (113 g) corn.  8 oz (240 mL) milk.  8 oz (170 g) strawberries with sugar-free whipped topping. Carbohydrate calculation 1. Identify the foods that contain carbohydrates: ? Rice. ? Corn. ? Milk. ? Strawberries. 2. Calculate how many servings you have of each food: ? 2 servings rice. ? 1 serving corn. ? 1 serving milk. ? 1 serving strawberries. 3. Multiply each number of servings by 15 g: ? 2 servings rice x 15 g = 30 g. ? 1 serving corn x 15 g = 15 g. ? 1 serving milk x 15 g = 15 g. ? 1  serving strawberries x 15 g = 15 g. 4. Add together all of the amounts to find the total grams of carbohydrates eaten: ? 30 g + 15 g + 15 g + 15 g = 75 g of carbohydrates total. Summary  Carbohydrate counting is a method of keeping track of how many carbohydrates you eat.  Eating carbohydrates naturally increases the amount of sugar (glucose) in the blood.  Counting how many carbohydrates you eat helps keep your blood glucose within normal limits, which helps you manage your diabetes.  A diet and nutrition specialist (registered dietitian) can help you make a meal plan and calculate how many carbohydrates you should have at each meal and snack. This information is not intended to replace advice given to you by your health care provider. Make sure you discuss any questions you have with your health care provider. Document Revised: 07/03/2017 Document Reviewed: 05/22/2016 Elsevier Patient Education  2020 [employer] Inc.  
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
## 2020-08-18 LAB — COMPREHENSIVE METABOLIC PANEL
AG Ratio: 1.8 (calc) (ref 1.0–2.5)
ALT: 55 U/L — ABNORMAL HIGH (ref 9–46)
AST: 34 U/L (ref 10–35)
Albumin: 4.2 g/dL (ref 3.6–5.1)
Alkaline phosphatase (APISO): 40 U/L (ref 35–144)
BUN/Creatinine Ratio: 16 (calc) (ref 6–22)
BUN: 21 mg/dL (ref 7–25)
CO2: 23 mmol/L (ref 20–32)
Calcium: 9.6 mg/dL (ref 8.6–10.3)
Chloride: 109 mmol/L (ref 98–110)
Creat: 1.3 mg/dL — ABNORMAL HIGH (ref 0.70–1.18)
Globulin: 2.4 g/dL (calc) (ref 1.9–3.7)
Glucose, Bld: 119 mg/dL — ABNORMAL HIGH (ref 65–99)
Potassium: 3.9 mmol/L (ref 3.5–5.3)
Sodium: 142 mmol/L (ref 135–146)
Total Bilirubin: 0.5 mg/dL (ref 0.2–1.2)
Total Protein: 6.6 g/dL (ref 6.1–8.1)

## 2020-08-18 LAB — MICROALBUMIN / CREATININE URINE RATIO
Creatinine, Urine: 59 mg/dL (ref 20–320)
Microalb Creat Ratio: 3 mcg/mg creat (ref <30)
Microalb, Ur: 0.2 mg/dL

## 2020-08-18 LAB — HEMOGLOBIN A1C
Hgb A1c MFr Bld: 6.3 % of total Hgb — ABNORMAL HIGH (ref <5.7)
Mean Plasma Glucose: 134 (calc)
eAG (mmol/L): 7.4 (calc)

## 2020-08-18 LAB — LIPID PANEL
Cholesterol: 207 mg/dL — ABNORMAL HIGH (ref <200)
HDL: 53 mg/dL (ref >=40)
LDL Cholesterol (Calc): 129 mg/dL (calc) — ABNORMAL HIGH
Non-HDL Cholesterol (Calc): 154 mg/dL (calc) — ABNORMAL HIGH (ref <130)
Total CHOL/HDL Ratio: 3.9 (calc) (ref <5.0)
Triglycerides: 135 mg/dL (ref <150)

## 2020-08-19 ENCOUNTER — Other Ambulatory Visit: Payer: Self-pay | Admitting: Family Medicine

## 2020-08-19 ENCOUNTER — Ambulatory Visit: Payer: Medicare Other | Attending: Internal Medicine

## 2020-08-19 DIAGNOSIS — E1165 Type 2 diabetes mellitus with hyperglycemia: Secondary | ICD-10-CM

## 2020-08-19 DIAGNOSIS — E1169 Type 2 diabetes mellitus with other specified complication: Secondary | ICD-10-CM

## 2020-08-19 DIAGNOSIS — Z23 Encounter for immunization: Secondary | ICD-10-CM

## 2020-08-19 NOTE — Progress Notes (Signed)
   Covid-19 Vaccination Clinic  Name:  Garrett [name]    MRN: [id number] DOB: 1951-03-10  08/19/2020  Mr. [name] was observed post Covid-19 immunization for 15 minutes without incident. He was provided with Vaccine Information Sheet and instruction to access the V-Safe system.   Mr. [name] was instructed to call 911 with any severe reactions post vaccine: Marland Kitchen Difficulty breathing  . Swelling of face and throat  . A fast heartbeat  . A bad rash all over body  . Dizziness and weakness

## 2020-08-22 MED ORDER — PRAVASTATIN SODIUM 20 MG PO TABS
20.0000 mg | ORAL_TABLET | Freq: Every day | ORAL | 2 refills | Status: DC
Start: 2020-08-22 — End: 2020-10-02

## 2020-08-22 NOTE — Addendum Note (Signed)
Addended byDamita Dunnings D on: 08/22/2020 10:03 AM   Modules accepted: Orders

## 2020-09-01 ENCOUNTER — Ambulatory Visit (INDEPENDENT_AMBULATORY_CARE_PROVIDER_SITE_OTHER): Payer: Medicare Other | Admitting: Podiatry

## 2020-09-01 ENCOUNTER — Encounter: Payer: Self-pay | Admitting: Podiatry

## 2020-09-01 ENCOUNTER — Other Ambulatory Visit: Payer: Self-pay

## 2020-09-01 DIAGNOSIS — H00022 Hordeolum internum right lower eyelid: Secondary | ICD-10-CM | POA: Diagnosis not present

## 2020-09-01 DIAGNOSIS — E1121 Type 2 diabetes mellitus with diabetic nephropathy: Secondary | ICD-10-CM

## 2020-09-01 DIAGNOSIS — M79675 Pain in left toe(s): Secondary | ICD-10-CM

## 2020-09-01 DIAGNOSIS — L853 Xerosis cutis: Secondary | ICD-10-CM

## 2020-09-01 DIAGNOSIS — B351 Tinea unguium: Secondary | ICD-10-CM

## 2020-09-01 DIAGNOSIS — D3141 Benign neoplasm of right ciliary body: Secondary | ICD-10-CM | POA: Diagnosis not present

## 2020-09-01 DIAGNOSIS — M79674 Pain in right toe(s): Secondary | ICD-10-CM

## 2020-09-01 DIAGNOSIS — H40013 Open angle with borderline findings, low risk, bilateral: Secondary | ICD-10-CM | POA: Diagnosis not present

## 2020-09-02 ENCOUNTER — Emergency Department (HOSPITAL_COMMUNITY)
Admission: EM | Admit: 2020-09-02 | Discharge: 2020-09-02 | Disposition: A | Discharge status: Home / Self Care | Payer: Medicare Other | Attending: Emergency Medicine | Admitting: Emergency Medicine

## 2020-09-02 ENCOUNTER — Encounter (HOSPITAL_COMMUNITY): Payer: Self-pay | Admitting: Emergency Medicine

## 2020-09-02 ENCOUNTER — Emergency Department (HOSPITAL_COMMUNITY): Payer: Medicare Other

## 2020-09-02 DIAGNOSIS — J45909 Unspecified asthma, uncomplicated: Secondary | ICD-10-CM | POA: Diagnosis not present

## 2020-09-02 DIAGNOSIS — J189 Pneumonia, unspecified organism: Secondary | ICD-10-CM

## 2020-09-02 DIAGNOSIS — R079 Chest pain, unspecified: Secondary | ICD-10-CM | POA: Diagnosis not present

## 2020-09-02 DIAGNOSIS — I509 Heart failure, unspecified: Secondary | ICD-10-CM | POA: Diagnosis not present

## 2020-09-02 DIAGNOSIS — E119 Type 2 diabetes mellitus without complications: Secondary | ICD-10-CM | POA: Diagnosis not present

## 2020-09-02 DIAGNOSIS — Z743 Need for continuous supervision: Secondary | ICD-10-CM | POA: Diagnosis not present

## 2020-09-02 DIAGNOSIS — K449 Diaphragmatic hernia without obstruction or gangrene: Secondary | ICD-10-CM | POA: Diagnosis not present

## 2020-09-02 DIAGNOSIS — I110 Hypertensive heart disease with heart failure: Secondary | ICD-10-CM | POA: Diagnosis not present

## 2020-09-02 DIAGNOSIS — R0902 Hypoxemia: Secondary | ICD-10-CM | POA: Diagnosis not present

## 2020-09-02 DIAGNOSIS — J188 Other pneumonia, unspecified organism: Secondary | ICD-10-CM | POA: Insufficient documentation

## 2020-09-02 DIAGNOSIS — Z20822 Contact with and (suspected) exposure to covid-19: Secondary | ICD-10-CM | POA: Insufficient documentation

## 2020-09-02 DIAGNOSIS — R069 Unspecified abnormalities of breathing: Secondary | ICD-10-CM | POA: Diagnosis not present

## 2020-09-02 DIAGNOSIS — R0602 Shortness of breath: Secondary | ICD-10-CM | POA: Diagnosis not present

## 2020-09-02 LAB — BRAIN NATRIURETIC PEPTIDE: B Natriuretic Peptide: 1207.9 pg/mL — ABNORMAL HIGH (ref 0.0–100.0)

## 2020-09-02 LAB — BASIC METABOLIC PANEL
Anion gap: 10 (ref 5–15)
BUN: 15 mg/dL (ref 8–23)
CO2: 22 mmol/L (ref 22–32)
Calcium: 9.4 mg/dL (ref 8.9–10.3)
Chloride: 112 mmol/L — ABNORMAL HIGH (ref 98–111)
Creatinine, Ser: 1.35 mg/dL — ABNORMAL HIGH (ref 0.61–1.24)
GFR calc Af Amer: >60 mL/min (ref >60)
GFR calc non Af Amer: 53 mL/min — ABNORMAL LOW (ref >60)
Glucose, Bld: 146 mg/dL — ABNORMAL HIGH (ref 70–99)
Potassium: 4 mmol/L (ref 3.5–5.1)
Sodium: 144 mmol/L (ref 135–145)

## 2020-09-02 LAB — CBC
HCT: 41.6 % (ref 39.0–52.0)
Hemoglobin: 13.2 g/dL (ref 13.0–17.0)
MCH: 29.3 pg (ref 26.0–34.0)
MCHC: 31.7 g/dL (ref 30.0–36.0)
MCV: 92.2 fL (ref 80.0–100.0)
Platelets: 290 10*3/uL (ref 150–400)
RBC: 4.51 MIL/uL (ref 4.22–5.81)
RDW: 14.7 % (ref 11.5–15.5)
WBC: 13.7 10*3/uL — ABNORMAL HIGH (ref 4.0–10.5)
nRBC: 0 % (ref 0.0–0.2)

## 2020-09-02 LAB — SARS CORONAVIRUS 2 BY RT PCR (HOSPITAL ORDER, PERFORMED IN ~~LOC~~ HOSPITAL LAB): SARS Coronavirus 2: NEGATIVE

## 2020-09-02 LAB — TROPONIN I (HIGH SENSITIVITY): Troponin I (High Sensitivity): 11 ng/L (ref <18)

## 2020-09-02 MED ORDER — IOHEXOL 350 MG/ML SOLN
80.0000 mL | Freq: Once | INTRAVENOUS | Status: AC | PRN
  Administered 2020-09-02: 80 mL via INTRAVENOUS

## 2020-09-02 MED ORDER — FUROSEMIDE 10 MG/ML IJ SOLN
40.0000 mg | Freq: Once | INTRAMUSCULAR | Status: AC
  Administered 2020-09-02: 40 mg via INTRAVENOUS
  Filled 2020-09-02: qty 4

## 2020-09-02 MED ORDER — AMOXICILLIN-POT CLAVULANATE 875-125 MG PO TABS: 1.0000 | ORAL_TABLET | Freq: Two times a day (BID) | ORAL | 0 refills | Status: DC

## 2020-09-02 MED ORDER — AZITHROMYCIN 250 MG PO TABS
500.0000 mg | ORAL_TABLET | Freq: Once | ORAL | Status: AC
  Administered 2020-09-02: 500 mg via ORAL
  Filled 2020-09-02: qty 2

## 2020-09-02 MED ORDER — ACETAMINOPHEN 325 MG PO TABS
650.0000 mg | ORAL_TABLET | Freq: Once | ORAL | Status: AC
  Administered 2020-09-02: 650 mg via ORAL
  Filled 2020-09-02: qty 2

## 2020-09-02 MED ORDER — SODIUM CHLORIDE 0.9 % IV SOLN
1.0000 g | Freq: Once | INTRAVENOUS | Status: AC
  Administered 2020-09-02: 1 g via INTRAVENOUS
  Filled 2020-09-02: qty 10
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
## 2020-09-02 NOTE — ED Provider Notes (Signed)
.  Critical Care Performed by: Wyvonnia Dusky, MD Authorized by: Wyvonnia Dusky, MD   Critical care provider statement:    Critical care time (minutes):  35   Critical care was necessary to treat or prevent imminent or life-threatening deterioration of the following conditions:  Cardiac failure   Critical care was time spent personally by me on the following activities:  Discussions with consultants, evaluation of patient's response to treatment, examination of patient, ordering and performing treatments and interventions, ordering and review of laboratory studies, ordering and review of radiographic studies, pulse oximetry, re-evaluation of patient's condition, obtaining history from patient or surrogate and review of old charts Comments:     IV diuresis for CHF      Wyvonnia Dusky, MD 09/02/20 1737
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
## 2020-09-02 NOTE — ED Triage Notes (Signed)
IV RT. AC , Iv Rt. Ac

## 2020-09-02 NOTE — ED Triage Notes (Signed)
EMS stated, SOB since 0200. Up every 30 to re-postion. Given himself 2 treatments.  Called Dr. Phill Myron to come here

## 2020-09-02 NOTE — ED Provider Notes (Signed)
Medical screening examination/treatment/procedure(s) were conducted as a shared visit with non-physician practitioner(s) and myself.  I personally evaluated the patient during the encounter.  69 yo male w/ hx for CHF (EF 55-60% in 2020 echo) on 20 mg lasix daily presenting to ED with shortness of breath beginning last night.  He describes orthopnea and difficulty holding a normal conversation due to dyspnea, which are new symptoms for him.  He reports compliance with all of his meds.  Denies hx of DVT/PE.  Denies CP.  On exam he is obese, NAD Lungs with rhonchi in bilateral lower fields He is able to speak in full sentences but has labored breathing, RR 22 95-96% on room air O2 air, no hypoxia Bilateral symmetrical mild pitting edema  Xrays notable for possible bilateral infiltrates consistent with PNA.  He is febrile with WBC 13.7.  I suspect this is likely the source. His covid test is negative.  CURB65 score of 1 for age only.  Rocephin + azithromycin given in the ED.  He also demonstrated some B lines on pulm ultrasound, with BNP elevated to 1200.  I advised a dose of 40 mg IV lasix here.  Patient signed out to afternoon EDP team at 3:30 pm with plan for a CT PE given the abrupt onset of his symptoms and BNP elevation - this will also help [place] better characterize these infiltrates.  I asked for a clinical reassessment after IV lasix for his overall work of breathing.  If improved breathing and no PE, he can be discharged home on PO antibiotics and close outpatient follow up with PCP.  EKG Interpretation  Date/Time:  Saturday September 02 2020 09:11:20 EDT Ventricular Rate:  124 PR Interval:  138 QRS Duration: 84 QT Interval:  326 QTC Calculation: 468 R Axis:   78 Text Interpretation: Sinus tachycardia ST & T wave abnormality, consider inferior ischemia Abnormal ECG No STEMI Confirmed by Octaviano Glow [phone]) on 09/02/2020 11:20:18 AM     Wyvonnia Dusky, MD 09/02/20 1736

## 2020-09-02 NOTE — ED Notes (Signed)
Patient verbalizes understanding of discharge instructions. Opportunity for questioning and answers were provided. Armband removed by staff, pt discharged from ED.  

## 2020-09-04 ENCOUNTER — Telehealth: Payer: Self-pay

## 2020-09-04 ENCOUNTER — Telehealth: Payer: Self-pay | Admitting: Family Medicine

## 2020-09-04 NOTE — Telephone Encounter (Signed)
Patient called back and said to disregard message, she found a Cardiologist to see him Friday.

## 2020-09-04 NOTE — Telephone Encounter (Signed)
Patient was in hospital on Saturday with CHF, was told to follow up with cardiologist this week. Patient's wife is calling for referral to Cardiologist that can see him this week.

## 2020-09-04 NOTE — Telephone Encounter (Signed)
Nurse Assessment Nurse: May, RN, Tammy Date/Time [name] Time): 09/02/2020 7:22:12 AM Confirm and document reason for call. If symptomatic, describe symptoms. ---Caller states her husband was up coughing all night. Caller states his temp was 104 and now it is 101. Caller states he is having difficulty breathing and using his nebulizer. Has the patient had close contact with a person known or suspected to have the novel coronavirus illness OR traveled / lives in area with major community spread (including international travel) in the last 14 days from the onset of symptoms? * If Asymptomatic, screen for exposure and travel within the last 14 days. ---No Does the patient have any new or worsening symptoms? ---Yes Will a triage be completed? ---Yes Related visit to physician within the last 2 weeks? ---No Does the PT have any chronic conditions? (i.e. diabetes, asthma, this includes High risk factors for pregnancy, etc.) ---Yes List chronic conditions. ---copd, pe Is this a behavioral health or substance abuse call? ---No Guidelines Guideline Title Affirmed Question Affirmed Notes Nurse Date/Time (Eastern Time) Breathing Difficulty [1] MODERATE difficulty breathing (e.g., speaks in phrases, SOB even at rest, pulse 100-120) AND [2] NEWMay, RN, Tammy 09/02/2020 7:24:49 AM PLEASE NOTE: All timestamps contained within this report are represented as [place] Standard Time. CONFIDENTIALTY NOTICE: This fax transmission is intended only for the addressee. It contains information that is legally privileged, confidential or otherwise protected from use or disclosure. If you are not the intended recipient, you are strictly prohibited from reviewing, disclosing, copying using or disseminating any of this information or taking any action in reliance on or regarding this information. If you have received this fax in error, please notify us immediately by telephone so that we can arrange for its return  to [place]. Phone: [phone], Toll-Free: [phone], Fax: [phone] Page: 2 of 2 Call Id: [id number] Guidelines Guideline Title Affirmed Question Affirmed Notes Nurse Date/Time [name] Time) onset or WORSE than normal Disp. Time [name] Time) Disposition Final User 09/02/2020 7:20:15 AM Send to Urgent Vassie Loll 09/02/2020 7:26:26 AM Go to ED Now Yes May, RN, Tammy Caller Disagree/Comply Comply Caller Understands Yes PreDisposition Did not know what to do Care Advice Given Per Guideline GO TO ED NOW: * You need to be seen in the Emergency Department. * Go to the ED at ___________ [place] now. Drive carefully. NOTE TO TRIAGER - DRIVING: * Another adult should drive. * If immediate transportation is not available via car or taxi, then the patient should be instructed to call [phone]. CALL EMS 911 IF: * Call EMS if you become worse. CARE ADVICE given per Breathing Difficulty (Adult) guideline. Referrals GO TO FACILITY UNDECIDED  Pt seen at ED on 09/02/20.

## 2020-09-04 NOTE — Telephone Encounter (Signed)
Noted  

## 2020-09-05 ENCOUNTER — Encounter: Payer: Self-pay | Admitting: Podiatry

## 2020-09-05 DIAGNOSIS — G894 Chronic pain syndrome: Secondary | ICD-10-CM | POA: Diagnosis not present

## 2020-09-05 DIAGNOSIS — M4726 Other spondylosis with radiculopathy, lumbar region: Secondary | ICD-10-CM | POA: Diagnosis not present

## 2020-09-05 DIAGNOSIS — M47816 Spondylosis without myelopathy or radiculopathy, lumbar region: Secondary | ICD-10-CM | POA: Diagnosis not present

## 2020-09-05 DIAGNOSIS — M5136 Other intervertebral disc degeneration, lumbar region: Secondary | ICD-10-CM | POA: Diagnosis not present

## 2020-09-05 NOTE — Progress Notes (Signed)
  Subjective:  Patient ID: Garrett [name], male    DOB: 06-09-1951,  MRN: [id number]  Chief Complaint  Patient presents with  . Peripheral Neuropathy    f/u appt., feet are about the same as last visit,does not like the shoes received,shoes are flopping around , has been using inserts in other shoes which work better   69 y.o. male returns for the above complaint.  Patient presents with thickened elongated dystrophic toenails x10.  Patient would like to debride them down as he is not able to do it ASAP.  He denies any other acute complaints.  Patient is a diabetic with last A1c of 6.3.  Patient had picked up his diabetic shoes recently.  Patient also has secondary complaint of xerosis to bilateral lower extremity.  He would like to discuss treatment options for it  Objective:  There were no vitals filed for this visit. Podiatric Exam: Vascular: dorsalis pedis and posterior tibial pulses are palpable bilateral. Capillary return is immediate. Temperature gradient is WNL. Skin turgor WNL  Sensorium: Normal Semmes Weinstein monofilament test. Normal tactile sensation bilaterally. Nail Exam: Pt has thick disfigured discolored nails with subungual debris noted bilateral entire nail hallux through fifth toenails.  Pain on palpation to the nails. Ulcer Exam: There is no evidence of ulcer or pre-ulcerative changes or infection. Orthopedic Exam: Muscle tone and strength are WNL. No limitations in general ROM. No crepitus or effusions noted. HAV  B/L.  Hammer toes 2-5  B/L. Skin: No Porokeratosis. No infection or ulcers.  Xerosis noted to bilateral lower extremity without itching    Assessment & Plan:   1. Controlled type 2 diabetes mellitus with diabetic nephropathy, without long-term current use of insulin ([place])   2. Xerosis cutis   3. Pain due to onychomycosis of toenails of both feet     Patient was evaluated and treated and all questions answered.  Xerosis bilateral lower extremity -I  explained to the patient the etiology of xerosis and various treatment options were extensively discussed.  I explained to the patient the importance of maintaining moisturization of the skin with application of over-the-counter lotion such as Eucerin or Luciderm.  I have asked the patient to apply this twice a day.  If unable to resolve patient will benefit from prescription lotion.  Onychomycosis with pain  -Nails palliatively debrided as below. -Educated on self-care  Procedure: Nail Debridement Rationale: pain  Type of Debridement: manual, sharp debridement. Instrumentation: Nail nipper, rotary burr. Number of Nails: 10  Procedures and Treatment: Consent by patient was obtained for treatment procedures. The patient understood the discussion of treatment and procedures well. All questions were answered thoroughly reviewed. Debridement of mycotic and hypertrophic toenails, 1 through 5 bilateral and clearing of subungual debris. No ulceration, no infection noted.  Return Visit-Office Procedure: Patient instructed to return to the office for a follow up visit 3 months for continued evaluation and treatment.  Boneta Lucks, DPM    No follow-ups on file.

## 2020-09-08 DIAGNOSIS — I509 Heart failure, unspecified: Secondary | ICD-10-CM | POA: Diagnosis not present

## 2020-09-08 DIAGNOSIS — E785 Hyperlipidemia, unspecified: Secondary | ICD-10-CM | POA: Diagnosis not present

## 2020-09-08 DIAGNOSIS — I5033 Acute on chronic diastolic (congestive) heart failure: Secondary | ICD-10-CM | POA: Diagnosis not present

## 2020-09-08 DIAGNOSIS — I110 Hypertensive heart disease with heart failure: Secondary | ICD-10-CM | POA: Diagnosis not present

## 2020-09-08 DIAGNOSIS — Z87891 Personal history of nicotine dependence: Secondary | ICD-10-CM | POA: Diagnosis not present

## 2020-09-11 ENCOUNTER — Other Ambulatory Visit: Payer: Self-pay | Admitting: Family Medicine

## 2020-09-18 DIAGNOSIS — M47816 Spondylosis without myelopathy or radiculopathy, lumbar region: Secondary | ICD-10-CM | POA: Diagnosis not present

## 2020-09-19 ENCOUNTER — Telehealth: Payer: Medicare Other

## 2020-09-26 ENCOUNTER — Telehealth: Payer: Self-pay | Admitting: Pharmacist

## 2020-09-26 NOTE — Progress Notes (Addendum)
Chronic Care Management Pharmacy Assistant   Name: Garrett [name]  MRN: [id number] DOB: 01-15-1951  Reason for Encounter: General Adherence Call/ Medication Review   PCP : Ann Held, DO  Allergies:   Allergies  Allergen Reactions  . Bee Venom Anaphylaxis  . Garlic Swelling    Medications: Outpatient Encounter Medications as of 09/26/2020  Medication Sig Note  . acidophilus (RISAQUAD) CAPS capsule Take 1 capsule by mouth daily.    Marland Kitchen albuterol (PROVENTIL) (2.5 MG/3ML) 0.083% nebulizer solution Take 3 mLs (2.5 mg total) by nebulization every 6 (six) hours as needed for wheezing or shortness of breath.   . allopurinol (ZYLOPRIM) 100 MG tablet TAKE 1 TABLET BY MOUTH  DAILY   . amoxicillin-clavulanate (AUGMENTIN) 875-125 MG tablet Take 1 tablet by mouth 2 (two) times daily.   . celecoxib (CELEBREX) 200 MG capsule TAKE 1 CAPSULE BY MOUTH  TWICE DAILY   . Cyanocobalamin 5000 MCG TBDP Take 5,000 mcg by mouth daily.  01/08/2019: B-12  . EPINEPHrine (EPIPEN) 0.3 mg/0.3 mL SOAJ injection Inject 0.3 mg into the muscle as needed for anaphylaxis.    . famotidine (PEPCID) 20 MG tablet Take 1 tablet (20 mg total) by mouth at bedtime.   . fenofibrate 160 MG tablet TAKE 1 TABLET BY MOUTH  DAILY   . flurbiprofen (ANSAID) 100 MG tablet Take 1 tablet (100 mg total) by mouth 2 (two) times daily.   . fluticasone (FLONASE) 50 MCG/ACT nasal spray Place 2 sprays into both nostrils daily.   . Fluticasone-Umeclidin-Vilant (TRELEGY ELLIPTA) 100-62.5-25 MCG/INH AEPB 1 inh daily   . furosemide (LASIX) 20 MG tablet TAKE 1 TABLET BY MOUTH  DAILY   . gabapentin (NEURONTIN) 100 MG capsule TAKE 2 CAPSULES BY MOUTH AT BEDTIME   . glucose blood (ONE TOUCH ULTRA TEST) test strip USE AS DIRECTED THREE TIMES DAILY.  DX CODE E11.21   . levocetirizine (XYZAL) 5 MG tablet TAKE 1 TABLET BY MOUTH IN  THE EVENING   . magnesium oxide (MAG-OX) 400 (241.3 Mg) MG tablet Take 1 tablet (400 mg total) by mouth daily.     . meclizine (ANTIVERT) 25 MG tablet Take 1 tablet (25 mg total) by mouth 3 (three) times daily as needed for dizziness. (Patient taking differently: Take 25 mg by mouth as needed for dizziness. )   . memantine (NAMENDA) 10 MG tablet TAKE 2 TABLETS BY MOUTH  DAILY   . metFORMIN (GLUCOPHAGE) 500 MG tablet TAKE 1 TABLET BY MOUTH  TWICE DAILY WITH A MEAL   . metoprolol tartrate (LOPRESSOR) 25 MG tablet TAKE 1 TABLET BY MOUTH  TWICE DAILY   . Multiple Vitamins-Minerals (ONE-A-DAY WEIGHT SMART ADVANCE PO) Take 1 tablet by mouth daily. Centrum Silver   . omeprazole (PRILOSEC) 40 MG capsule Take 1 capsule shortly before breakfast and 1 capsule shortly before dinner meal.   . potassium chloride SA (KLOR-CON) 20 MEQ tablet TAKE 2 TABLETS BY MOUTH  DAILY   . pravastatin (PRAVACHOL) 20 MG tablet Take 1 tablet (20 mg total) by mouth at bedtime.   Marland Kitchen PROAIR HFA 108 (90 Base) MCG/ACT inhaler Inhale 1 puff into the lungs every 6 (six) hours as needed for wheezing or shortness of breath.   . SUMAtriptan (IMITREX) 50 MG tablet Take 1 tablet as needed for migraine/vertigo. Do not take more than 3 a week   . topiramate (TOPAMAX) 50 MG tablet Take 1 tablet (50 mg total) by mouth 2 (two) times daily.   Marland Kitchen  verapamil (CALAN-SR) 240 MG CR tablet TAKE 1 TABLET BY MOUTH AT  BEDTIME   . XARELTO 20 MG TABS tablet TAKE 1 TABLET BY MOUTH  DAILY WITH SUPPER    No facility-administered encounter medications on file as of 09/26/2020.    Current Diagnosis: Patient Active Problem List   Diagnosis Date Noted  . Laceration of right hand 08/02/2019  . Acute pulmonary embolism ([place]) 01/07/2019  . Chronic diastolic CHF (congestive heart failure) ([place]) 01/07/2019  . Preventative health care 02/10/2018  . Dizziness 02/10/2018  . Spondylolisthesis at L3-L4 level 10/28/2017  . Cough variant asthma  vs uacs/ pseudoasthma 06/17/2017  . Pulmonary embolism and infarction ([place]) 02/09/2017  . Bilateral pulmonary embolism ([place]) 02/04/2017   . Greater trochanteric bursitis of left hip 01/14/2017  . Greater trochanteric bursitis of right hip 12/20/2016  . Degenerative arthritis of knee, bilateral 10/08/2016  . Upper airway cough syndrome 03/22/2016  . Multiple pulmonary nodules 03/22/2016  . Acute bronchitis 01/22/2016  . Acute upper respiratory infection 10/25/2015  . Elevated CK 07/18/2015  . History of colonic polyps 04/11/2015  . Lumbar stenosis with neurogenic claudication 11/14/2014  . Spondylolysis of cervical region 02/25/2014  . OSA (obstructive sleep apnea) 12/08/2013  . DOE (dyspnea on exertion) 11/04/2013  . Morbid obesity due to excess calories ([place])   . Cervicalgia   . Elevated PSA, less than 10 ng/ml 03/23/2013  . Venous insufficiency of leg 02/18/2012  . Itching 02/18/2012  . Mild dementia ([place]) 05/28/2011  . Hyperlipidemia associated with type 2 diabetes mellitus ([place]) 05/27/2011  . Controlled type 2 diabetes mellitus with diabetic nephropathy ([place]) 04/10/2011  . Gout 04/10/2011  . Essential hypertension 04/10/2011  . OA (osteoarthritis) 04/10/2011  . GERD (gastroesophageal reflux disease) 04/10/2011  . Migraine headache without aura 04/10/2011  . Allergic rhinitis, cause unspecified 04/10/2011  . Bee sting allergy 04/10/2011    Goals Addressed   None     Follow-Up:  Pharmacist Review   Called patient and discussed medication adherence with patient.  Since last hospital visit on 09-02-2020, Patient states he is feeling pretty good. Patient reports [facility] Pontes gave him a referral to a cardiologist.  States Cardiologist changed lasix to 40 mg and has a new statin which he can not recall the name at this time.  Patient medication was unavailable at the time.  Reports no problems with the new medication.  [place] Shannon, [facility] [place] [phone]  Reviewed cardio noted from Dr. Lamonte Sakai on 09/08/20. Pt was started on atorvastatin 40mg   daily and furosemide increased to 40mg  daily. RTC 3 months. Will update med list to reflect noted changed. -kDay Reviewed by: De Blanch, PharmD Clinical Pharmacist [facility] [phone]

## 2020-09-28 ENCOUNTER — Ambulatory Visit: Payer: Medicare Other | Admitting: Cardiovascular Disease

## 2020-09-29 ENCOUNTER — Other Ambulatory Visit: Payer: Self-pay

## 2020-09-29 ENCOUNTER — Emergency Department
Admission: EM | Admit: 2020-09-29 | Discharge: 2020-09-29 | Disposition: A | Discharge status: Home / Self Care | Payer: Medicare Other | Source: Home / Self Care

## 2020-09-29 ENCOUNTER — Emergency Department (INDEPENDENT_AMBULATORY_CARE_PROVIDER_SITE_OTHER): Payer: Medicare Other

## 2020-09-29 DIAGNOSIS — Z87891 Personal history of nicotine dependence: Secondary | ICD-10-CM | POA: Diagnosis not present

## 2020-09-29 DIAGNOSIS — R0789 Other chest pain: Secondary | ICD-10-CM | POA: Diagnosis not present

## 2020-09-29 DIAGNOSIS — I10 Essential (primary) hypertension: Secondary | ICD-10-CM | POA: Diagnosis not present

## 2020-09-29 DIAGNOSIS — R0602 Shortness of breath: Secondary | ICD-10-CM

## 2020-09-29 DIAGNOSIS — Z9189 Other specified personal risk factors, not elsewhere classified: Secondary | ICD-10-CM

## 2020-09-29 DIAGNOSIS — Z7901 Long term (current) use of anticoagulants: Secondary | ICD-10-CM | POA: Diagnosis not present

## 2020-09-29 DIAGNOSIS — Z86718 Personal history of other venous thrombosis and embolism: Secondary | ICD-10-CM | POA: Diagnosis not present

## 2020-09-29 DIAGNOSIS — R0600 Dyspnea, unspecified: Secondary | ICD-10-CM

## 2020-09-29 DIAGNOSIS — K449 Diaphragmatic hernia without obstruction or gangrene: Secondary | ICD-10-CM | POA: Diagnosis not present

## 2020-09-29 DIAGNOSIS — R9431 Abnormal electrocardiogram [ECG] [EKG]: Secondary | ICD-10-CM | POA: Diagnosis not present

## 2020-09-29 DIAGNOSIS — R079 Chest pain, unspecified: Secondary | ICD-10-CM | POA: Diagnosis not present

## 2020-09-29 DIAGNOSIS — Z7951 Long term (current) use of inhaled steroids: Secondary | ICD-10-CM | POA: Diagnosis not present

## 2020-09-29 DIAGNOSIS — Z86711 Personal history of pulmonary embolism: Secondary | ICD-10-CM | POA: Diagnosis not present

## 2020-09-29 DIAGNOSIS — J449 Chronic obstructive pulmonary disease, unspecified: Secondary | ICD-10-CM | POA: Diagnosis not present

## 2020-09-29 DIAGNOSIS — G43909 Migraine, unspecified, not intractable, without status migrainosus: Secondary | ICD-10-CM | POA: Diagnosis not present

## 2020-09-29 DIAGNOSIS — D6862 Lupus anticoagulant syndrome: Secondary | ICD-10-CM | POA: Diagnosis not present

## 2020-09-29 DIAGNOSIS — M109 Gout, unspecified: Secondary | ICD-10-CM | POA: Diagnosis not present

## 2020-09-29 DIAGNOSIS — I509 Heart failure, unspecified: Secondary | ICD-10-CM | POA: Diagnosis not present

## 2020-09-29 DIAGNOSIS — Z7984 Long term (current) use of oral hypoglycemic drugs: Secondary | ICD-10-CM | POA: Diagnosis not present

## 2020-09-29 DIAGNOSIS — E1142 Type 2 diabetes mellitus with diabetic polyneuropathy: Secondary | ICD-10-CM | POA: Diagnosis not present

## 2020-09-29 DIAGNOSIS — I110 Hypertensive heart disease with heart failure: Secondary | ICD-10-CM | POA: Diagnosis not present

## 2020-09-29 DIAGNOSIS — K219 Gastro-esophageal reflux disease without esophagitis: Secondary | ICD-10-CM | POA: Diagnosis not present

## 2020-09-29 DIAGNOSIS — I361 Nonrheumatic tricuspid (valve) insufficiency: Secondary | ICD-10-CM | POA: Diagnosis not present

## 2020-09-29 DIAGNOSIS — I700 Atherosclerosis of aorta: Secondary | ICD-10-CM | POA: Diagnosis not present

## 2020-09-29 DIAGNOSIS — Z79899 Other long term (current) drug therapy: Secondary | ICD-10-CM | POA: Diagnosis not present

## 2020-09-29 NOTE — Discharge Instructions (Addendum)
My exam findings here today are normal however if chest pain and shortness of breath is present I would recommend going immediately to the emergency department for further work-up with cardiac blood work and other diagnostics deemed necessary based on exam.  Urgent care limited ability to work-up cardiac conditions and my exam findings today rule out any acute respiratory illness or pneumonia. Given that symptoms have been ongoing for 3 days and you are having some radiation of the pain into your left arm and back I would recommend immediate evaluation in the closest ER. You declined transport via EMS as recommended by provider and agreed to transport herself to the ER.

## 2020-09-29 NOTE — ED Triage Notes (Signed)
Pt presents to Urgent Care with c/o cough and L side chest pressure/ache x 2 days. Endorses L arm pain and sob. Pt w/ recent hx of hospitalization d/t double pneumonia and CHF. Pt w/ no known COVID exposure. Pt has been vaccinated against COVID.
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
## 2020-09-30 DIAGNOSIS — I10 Essential (primary) hypertension: Secondary | ICD-10-CM | POA: Diagnosis not present

## 2020-09-30 DIAGNOSIS — R079 Chest pain, unspecified: Secondary | ICD-10-CM | POA: Diagnosis not present

## 2020-09-30 DIAGNOSIS — E119 Type 2 diabetes mellitus without complications: Secondary | ICD-10-CM | POA: Diagnosis not present

## 2020-09-30 DIAGNOSIS — R0602 Shortness of breath: Secondary | ICD-10-CM | POA: Diagnosis not present

## 2020-09-30 DIAGNOSIS — K219 Gastro-esophageal reflux disease without esophagitis: Secondary | ICD-10-CM | POA: Diagnosis not present

## 2020-09-30 LAB — SARS-COV-2 RNA,(COVID-19) QUALITATIVE NAAT: SARS CoV2 RNA: NOT DETECTED

## 2020-10-01 DIAGNOSIS — R079 Chest pain, unspecified: Secondary | ICD-10-CM | POA: Diagnosis not present

## 2020-10-02 ENCOUNTER — Other Ambulatory Visit: Payer: Self-pay

## 2020-10-05 ENCOUNTER — Telehealth: Payer: Self-pay | Admitting: Pharmacist

## 2020-10-05 NOTE — Progress Notes (Signed)
Chronic Care Management Pharmacy Assistant   Name: Garrett [name]  MRN: [id number] DOB: July 07, 1951   PCP : Ann Held, DO  Allergies:   Allergies  Allergen Reactions   Bee Venom Anaphylaxis   Garlic Swelling    Medications: Outpatient Encounter Medications as of 10/05/2020  Medication Sig Note   acidophilus (RISAQUAD) CAPS capsule Take 1 capsule by mouth daily.     albuterol (PROVENTIL) (2.5 MG/3ML) 0.083% nebulizer solution Take 3 mLs (2.5 mg total) by nebulization every 6 (six) hours as needed for wheezing or shortness of breath.    allopurinol (ZYLOPRIM) 100 MG tablet TAKE 1 TABLET BY MOUTH  DAILY    amoxicillin-clavulanate (AUGMENTIN) 875-125 MG tablet Take 1 tablet by mouth 2 (two) times daily.    atorvastatin (LIPITOR) 40 MG tablet Take 40 mg by mouth daily.    celecoxib (CELEBREX) 200 MG capsule TAKE 1 CAPSULE BY MOUTH  TWICE DAILY    Cyanocobalamin 5000 MCG TBDP Take 5,000 mcg by mouth daily.  01/08/2019: B-12   EPINEPHrine (EPIPEN) 0.3 mg/0.3 mL SOAJ injection Inject 0.3 mg into the muscle as needed for anaphylaxis.     famotidine (PEPCID) 20 MG tablet Take 1 tablet (20 mg total) by mouth at bedtime.    fenofibrate 160 MG tablet TAKE 1 TABLET BY MOUTH  DAILY    flurbiprofen (ANSAID) 100 MG tablet Take 1 tablet (100 mg total) by mouth 2 (two) times daily.    fluticasone (FLONASE) 50 MCG/ACT nasal spray Place 2 sprays into both nostrils daily.    Fluticasone-Umeclidin-Vilant (TRELEGY ELLIPTA) 100-62.5-25 MCG/INH AEPB 1 inh daily    furosemide (LASIX) 20 MG tablet TAKE 1 TABLET BY MOUTH  DAILY 10/02/2020: 40mg  daily. Increased per Dr. Lamonte Sakai 09/08/20   gabapentin (NEURONTIN) 100 MG capsule TAKE 2 CAPSULES BY MOUTH AT BEDTIME    glucose blood (ONE TOUCH ULTRA TEST) test strip USE AS DIRECTED THREE TIMES DAILY.  DX CODE E11.21    levocetirizine (XYZAL) 5 MG tablet TAKE 1 TABLET BY MOUTH IN  THE EVENING    magnesium oxide (MAG-OX) 400 (241.3 Mg)  MG tablet Take 1 tablet (400 mg total) by mouth daily.    meclizine (ANTIVERT) 25 MG tablet Take 1 tablet (25 mg total) by mouth 3 (three) times daily as needed for dizziness. (Patient taking differently: Take 25 mg by mouth as needed for dizziness. )    memantine (NAMENDA) 10 MG tablet TAKE 2 TABLETS BY MOUTH  DAILY    metFORMIN (GLUCOPHAGE) 500 MG tablet TAKE 1 TABLET BY MOUTH  TWICE DAILY WITH A MEAL    metoprolol tartrate (LOPRESSOR) 25 MG tablet TAKE 1 TABLET BY MOUTH  TWICE DAILY    Multiple Vitamins-Minerals (ONE-A-DAY WEIGHT SMART ADVANCE PO) Take 1 tablet by mouth daily. Centrum Silver    omeprazole (PRILOSEC) 40 MG capsule Take 1 capsule shortly before breakfast and 1 capsule shortly before dinner meal.    potassium chloride SA (KLOR-CON) 20 MEQ tablet TAKE 2 TABLETS BY MOUTH  DAILY    PROAIR HFA 108 (90 Base) MCG/ACT inhaler Inhale 1 puff into the lungs every 6 (six) hours as needed for wheezing or shortness of breath.    SUMAtriptan (IMITREX) 50 MG tablet Take 1 tablet as needed for migraine/vertigo. Do not take more than 3 a week    topiramate (TOPAMAX) 50 MG tablet Take 1 tablet (50 mg total) by mouth 2 (two) times daily.    verapamil (CALAN-SR) 240 MG CR tablet TAKE  1 TABLET BY MOUTH AT  BEDTIME    XARELTO 20 MG TABS tablet TAKE 1 TABLET BY MOUTH  DAILY WITH SUPPER    No facility-administered encounter medications on file as of 10/05/2020.    Current Diagnosis: Patient Active Problem List   Diagnosis Date Noted   Laceration of right hand 08/02/2019   Acute pulmonary embolism ([place]) 01/07/2019   Chronic diastolic CHF (congestive heart failure) ([place]) 01/07/2019   Preventative health care 02/10/2018   Dizziness 02/10/2018   Spondylolisthesis at L3-L4 level 10/28/2017   Cough variant asthma  vs uacs/ pseudoasthma 06/17/2017   Pulmonary embolism and infarction ([place]) 02/09/2017   Bilateral pulmonary embolism ([place]) 02/04/2017   Greater trochanteric bursitis  of left hip 01/14/2017   Greater trochanteric bursitis of right hip 12/20/2016   Degenerative arthritis of knee, bilateral 10/08/2016   Upper airway cough syndrome 03/22/2016   Multiple pulmonary nodules 03/22/2016   Acute bronchitis 01/22/2016   Acute upper respiratory infection 10/25/2015   Elevated CK 07/18/2015   History of colonic polyps 04/11/2015   Lumbar stenosis with neurogenic claudication 11/14/2014   Spondylolysis of cervical region 02/25/2014   OSA (obstructive sleep apnea) 12/08/2013   DOE (dyspnea on exertion) 11/04/2013   Morbid obesity due to excess calories (HCC)    Cervicalgia    Elevated PSA, less than 10 ng/ml 03/23/2013   Venous insufficiency of leg 02/18/2012   Itching 02/18/2012   Mild dementia ([place]) 05/28/2011   Hyperlipidemia associated with type 2 diabetes mellitus ([place]) 05/27/2011   Controlled type 2 diabetes mellitus with diabetic nephropathy ([place]) 04/10/2011   Gout 04/10/2011   Essential hypertension 04/10/2011   OA (osteoarthritis) 04/10/2011   GERD (gastroesophageal reflux disease) 04/10/2011   Migraine headache without aura 04/10/2011   Allergic rhinitis, cause unspecified 04/10/2011   Bee sting allergy 04/10/2011    Goals Addressed   None     Follow-Up:  Scheduled Follow-Up With Clinical Pharmacist November 5 at 10:30 via telephone.  [place], [facility] Pharmacist Assistant [phone]

## 2020-10-09 ENCOUNTER — Other Ambulatory Visit: Payer: Self-pay | Admitting: Family Medicine

## 2020-10-19 ENCOUNTER — Other Ambulatory Visit: Payer: Self-pay

## 2020-10-19 ENCOUNTER — Ambulatory Visit: Payer: Medicare Other | Admitting: Internal Medicine

## 2020-10-19 ENCOUNTER — Telehealth: Payer: Self-pay | Admitting: Pharmacist

## 2020-10-19 ENCOUNTER — Encounter: Payer: Self-pay | Admitting: Internal Medicine

## 2020-10-19 VITALS — BP 150/82 | HR 85 | Temp 97.6°F | Ht 69.0 in | Wt 223.6 lb

## 2020-10-19 DIAGNOSIS — R058 Other specified cough: Secondary | ICD-10-CM

## 2020-10-19 DIAGNOSIS — Z23 Encounter for immunization: Secondary | ICD-10-CM | POA: Diagnosis not present

## 2020-10-19 DIAGNOSIS — R0609 Other forms of dyspnea: Secondary | ICD-10-CM

## 2020-10-19 DIAGNOSIS — R0600 Dyspnea, unspecified: Secondary | ICD-10-CM | POA: Diagnosis not present

## 2020-10-19 NOTE — Assessment & Plan Note (Addendum)
Trial off acei 03/21/2016 > resolved 05/02/2016 > pulm f/u prn  - large HH noted on cxr 09/29/20  - reported worse on Trelegy at [facility] 10/19/2020   Upper airway cough syndrome (previously labeled PNDS),  is so named because it's frequently impossible to sort out how much is  CR/sinusitis with freq throat clearing (which can be related to primary GERD)   vs  causing  secondary (" extra esophageal")  GERD from wide swings in gastric pressure that occur with throat clearing, often  promoting self use of mint and menthol lozenges that reduce the lower esophageal sphincter tone and exacerbate the problem further in a cyclical fashion.   These are the same pts (now being labeled as having "irritable larynx syndrome" by some cough centers) who not infrequently have a history of having failed to tolerate ace inhibitors,  dry powder inhalers(like trelegy)  or biphosphonates or report having atypical/extraesophageal reflux symptoms that don't respond to standard doses of PPI  and are easily confused as having aecopd or asthma flares by even experienced allergists/ pulmonologists (myself included).   rec continue ppi bid ac and f/u with GI as planned  Pulmonary f/u can be prn          Each maintenance medication was reviewed in detail including emphasizing most importantly the difference between maintenance and prns and under what circumstances the prns are to be triggered using an action plan format where appropriate.  Total time for H and P, chart review, counseling, reviewing inhaler device and generating customized AVS unique to this office visit / charting = 23 min

## 2020-10-19 NOTE — Patient Instructions (Signed)
Plan A = Automatic = Always=    symbicort  80 Take 2 puffs first thing in am and then another 2 puffs about 12 hours later.    Plan B = Backup (to supplement plan A, not to replace it) Only use your albuterol inhaler as a rescue medication to be used if you can't catch your breath by resting or doing a relaxed purse lip breathing pattern.  - The less you use it, the better it will work when you need it. - Ok to use the inhaler up to 2 puffs  every 4 hours if you must but call for appointment if use goes up over your usual need - Don't leave home without it !!  (think of it like the spare tire for your car)   Plan C = Crisis (instead of Plan B but only if Plan B stops working) - only use your albuterol nebulizer if you first try Plan B and it fails to help > ok to use the nebulizer up to every 4 hours but if start needing it regularly call for immediate appointment    If you are satisfied with your treatment plan,  let your doctor know and he/she can either refill your medications or you can return here when your prescription runs out.     If in any way you are not 100% satisfied,  please tell us.  If 100% better, tell your friends!  Pulmonary follow up is as needed

## 2020-10-19 NOTE — Progress Notes (Addendum)
**Note Garrett-Identified via Obfuscation** Chronic Care Management Pharmacy Assistant   Name: Garrett [name]  MRN: [id number] DOB: 28-Aug-1951  Reason for Encounter: Medication Review  Patient Questions:  1.  Have you seen any other providers since your last visit? Yes  2.  Any changes in your medicines or health? No    PCP : Garrett Held, DO  Allergies:   Allergies  Allergen Reactions  . Bee Venom Anaphylaxis  . Garlic Swelling    Medications: Outpatient Encounter Medications as of 10/19/2020  Medication Sig Note  . acidophilus (RISAQUAD) CAPS capsule Take 1 capsule by mouth daily.    Marland Kitchen albuterol (PROVENTIL) (2.5 MG/3ML) 0.083% nebulizer solution Take 3 mLs (2.5 mg total) by nebulization every 6 (six) hours as needed for wheezing or shortness of breath.   . allopurinol (ZYLOPRIM) 100 MG tablet TAKE 1 TABLET BY MOUTH  DAILY   . atorvastatin (LIPITOR) 40 MG tablet Take 40 mg by mouth daily.   . budesonide-formoterol (SYMBICORT) 80-4.5 MCG/ACT inhaler Inhale into the lungs.   . celecoxib (CELEBREX) 200 MG capsule TAKE 1 CAPSULE BY MOUTH  TWICE DAILY   . Cyanocobalamin 5000 MCG TBDP Take 5,000 mcg by mouth daily.  01/08/2019: B-12  . EPINEPHrine (EPIPEN) 0.3 mg/0.3 mL SOAJ injection Inject 0.3 mg into the muscle as needed for anaphylaxis.    . famotidine (PEPCID) 20 MG tablet Take 1 tablet (20 mg total) by mouth at bedtime.   . fenofibrate 160 MG tablet TAKE 1 TABLET BY MOUTH  DAILY   . flurbiprofen (ANSAID) 100 MG tablet Take 1 tablet (100 mg total) by mouth 2 (two) times daily.   . Garrett [name] (FLONASE) 50 MCG/ACT nasal spray Place 2 sprays into both nostrils daily.   . furosemide (LASIX) 20 MG tablet TAKE 1 TABLET BY MOUTH  DAILY 10/02/2020: 40mg  daily. Increased per Dr. Lamonte Sakai 09/08/20  . gabapentin (NEURONTIN) 100 MG capsule TAKE 2 CAPSULES BY MOUTH AT BEDTIME   . glucose blood (ONE TOUCH ULTRA TEST) test strip USE AS DIRECTED THREE TIMES DAILY.  DX CODE E11.21   . levocetirizine (XYZAL) 5 MG tablet TAKE 1  TABLET BY MOUTH IN  THE EVENING   . magnesium oxide (MAG-OX) 400 (241.3 Mg) MG tablet Take 1 tablet (400 mg total) by mouth daily.   . meclizine (ANTIVERT) 25 MG tablet Take 1 tablet (25 mg total) by mouth 3 (three) times daily as needed for dizziness. (Patient taking differently: Take 25 mg by mouth as needed for dizziness. )   . memantine (NAMENDA) 10 MG tablet TAKE 2 TABLETS BY MOUTH  DAILY   . metFORMIN (GLUCOPHAGE) 500 MG tablet TAKE 1 TABLET BY MOUTH  TWICE DAILY WITH A MEAL   . metoprolol tartrate (LOPRESSOR) 25 MG tablet TAKE 1 TABLET BY MOUTH  TWICE DAILY   . Multiple Vitamins-Minerals (ONE-A-DAY WEIGHT SMART ADVANCE PO) Take 1 tablet by mouth daily. Centrum Silver   . omeprazole (PRILOSEC) 40 MG capsule Take 1 capsule shortly before breakfast and 1 capsule shortly before dinner meal.   . potassium chloride SA (KLOR-CON) 20 MEQ tablet TAKE 2 TABLETS BY MOUTH  DAILY   . PROAIR HFA 108 (90 Base) MCG/ACT inhaler Inhale 1 puff into the lungs every 6 (six) hours as needed for wheezing or shortness of breath.   . SUMAtriptan (IMITREX) 50 MG tablet Take 1 tablet as needed for migraine/vertigo. Do not take more than 3 a week   . topiramate (TOPAMAX) 50 MG tablet Take 1 tablet (50  mg total) by mouth 2 (two) times daily.   . verapamil (CALAN-SR) 240 MG CR tablet TAKE 1 TABLET BY MOUTH AT  BEDTIME   . XARELTO 20 MG TABS tablet TAKE 1 TABLET BY MOUTH  DAILY WITH SUPPER    No facility-administered encounter medications on file as of 10/19/2020.    Current Diagnosis: Patient Active Problem List   Diagnosis Date Noted  . Laceration of right hand 08/02/2019  . Acute pulmonary embolism ([place]) 01/07/2019  . Chronic diastolic CHF (congestive heart failure) ([place]) 01/07/2019  . Preventative health care 02/10/2018  . Dizziness 02/10/2018  . Spondylolisthesis at L3-L4 level 10/28/2017  . Cough variant asthma  vs uacs/ pseudoasthma 06/17/2017  . Pulmonary embolism and infarction ([place]) 02/09/2017  .  Bilateral pulmonary embolism ([place]) 02/04/2017  . Greater trochanteric bursitis of left hip 01/14/2017  . Greater trochanteric bursitis of right hip 12/20/2016  . Degenerative arthritis of knee, bilateral 10/08/2016  . Upper airway cough syndrome 03/22/2016  . Multiple pulmonary nodules 03/22/2016  . Acute bronchitis 01/22/2016  . Acute upper respiratory infection 10/25/2015  . Elevated CK 07/18/2015  . History of colonic polyps 04/11/2015  . Lumbar stenosis with neurogenic claudication 11/14/2014  . Spondylolysis of cervical region 02/25/2014  . OSA (obstructive sleep apnea) 12/08/2013  . DOE (dyspnea on exertion) 11/04/2013  . Morbid obesity due to excess calories ([place])   . Cervicalgia   . Elevated PSA, less than 10 ng/ml 03/23/2013  . Venous insufficiency of leg 02/18/2012  . Itching 02/18/2012  . Mild dementia ([place]) 05/28/2011  . Hyperlipidemia associated with type 2 diabetes mellitus ([place]) 05/27/2011  . Controlled type 2 diabetes mellitus with diabetic nephropathy ([place]) 04/10/2011  . Gout 04/10/2011  . Essential hypertension 04/10/2011  . OA (osteoarthritis) 04/10/2011  . GERD (gastroesophageal reflux disease) 04/10/2011  . Migraine headache without aura 04/10/2011  . Allergic rhinitis, cause unspecified 04/10/2011  . Bee sting allergy 04/10/2011    Goals Addressed   None   10-19-20 patient presented in office with Garrett [name] for a follow up.  Provider discontinued Garrett [name] 875-125 mg and Garrett [name].  Patient received Fluad quad High Dose.    Follow-Up:  Pharmacist Review   Garrett [name], [facility] [place] Pharmacist Assistant [phone]  Reviewed by: Garrett [name], PharmD Clinical Pharmacist [place] [facility] [phone]

## 2020-10-19 NOTE — Assessment & Plan Note (Signed)
02/03/2017  Walked RA x 3 laps @ 185 ft each stopped due to  End of study, nl pace,  Min sob/no desat   - legs weak  - Spirometry 02/03/2017  3 h p saba  wnl including curvature of f/v loop   Likely related to obesity and chf but no evidence of copd  - could have asthma so ok to continue symbicort but once cards w/u complete would consider prn symbicort /try to titrate down / off Based on two studies from NEJM  378; 20 p 1865 (2018) and 380 : p2020-30 (2019) in pts with mild asthma it is reasonable to use low dose symbicort eg 80 2bid "prn" flare in this setting but I emphasized this was only shown with symbicort and takes advantage of the rapid onset of action but is not the same as "rescue therapy" but can be stopped once the acute symptoms have resolved and the need for rescue has been minimized (< 2 x weekly)

## 2020-10-19 NOTE — Progress Notes (Addendum)
Subjective:    Patient ID: Garrett [name], male    DOB: 1951-11-25,   MRN: [id number]   Brief patient profile:  1 yowm quit smoking 1992 and exposed to dust in [place] until age of 80  no respiratory problems except for OSA until fall 2016 with recurrent coughing/ sob/ wheezing since then already rx with Symbicort/ saba so referred to pulmonary clinic 03/21/2016 by Dr Etter Sjogren with abn ct chest but no evidence of significant airflow obstruction on spirometry in 01/2017    History of Present Illness  03/21/2016 1st [facility] office visit/ Garrett [name]  On maint symbicort 160  Chief Complaint  Patient presents with  . Pulmonary Consult    Referred by Dr. Etter Sjogren. Pt states he had PNA recently and has had cough and SOB for the past few months- esp worse over the past 6 wks. He gets SOB walking short distances such as to his mailbox. His cough is prod at times with yellow/green sputum.    prior to 6 m prior to OV  Back and forth mb fine/ no need for any inhalers but breathing never right since, best rx is prednisone no def resp to symbicort or saba  rec Stop Altace and start avapro  150 mg  One half daily - take a whole if blood pressure too high Pantoprazole Take 30- 60 min before your first and last meals of the day  GERD diet  If get worse > Prednisone 10 mg take  4 each am x 2 days,   2 each am x 2 days,  1 each am x 2 days and stop     05/02/2016  f/u ov/Garrett [name] re:  prob acei uacs / mpns  Chief Complaint  Patient presents with  . Follow-up    Breathing is much improved- almost back to his normal baseline. He is no longer coughing. Has not had to use albuterol.    improved p 2 weeks off acei and now Not limited by breathing from desired activities   rec We will call you for follow up ct in Sept 2017> no change in old nodules, one new only 6 mm > rec 12 m f/u     02/03/2017  Acute extended  ov/Garrett [name] re: new sob  Chief Complaint  Patient presents with  . Follow-up    upper airway cough  syndrome, not having much cough but still struggles with dyspnea with exertion  acute ill on a 01/17/17  with fever chills and sob and minimal cough dry > UC same Sunday 01/19/17 Rocephin /levaquin 500 x 10 days and pred rec  Then doxy still has one day left but breathing but sob x 50 fts  Baseline = slower than nl = MMRC2 = can't walk a nl pace on a flat grade s sob but does fine slow and flat eg walking at mall  Not on any kind of regular use inhaler - symbicort listed but not taking Now on neb and it too does not really help his sob Taking ppi but not ac       No noct symptoms at all  rec Protonix Take 30- 60 min before your first and last meals of the day  GERD diet  CTa 02/04/17 > pos PE > xarelto     02/10/2017  Post hosp f/u ov/transition of care / Garrett [name] re: doe / dx of PE/ not using symb very rare need for saba/ never neb  Chief Complaint  Patient presents with  .  Follow-up    F/u for CT results. Breathing has been getting better since last OV.   Not limited by breathing from desired activities  But Relatively sedentary. rec Only use your albuterol as a rescue medication   - Ok to use up to 2 puffs  every 4 hours  But if you start needing it for any reason more than you do now then you need to restart regular use of symbicort 80 Take 2 puffs first thing in am and then another 2 puffs about 12 hours later.         12/12/2017  f/u ov/Garrett [name] re: cough variant asthma vs uacs  Chief Complaint  Patient presents with  . Follow-up    Pt states he has been doing pretty well since last visit. Does have complaints of occ. cough, SOB with exertion. Denies any CP.   sleeping fine minimal elevation / on cpap  Per [facility] / [facility]  More limited by back than breathing at this point / min daytime cough rec .Plan A = Automatic =  Continue symbicort 80 Take 2 puffs first thing in am and then another 2 puffs about 12 hours later.   Plan B = Backup Only use your albuterol (PROAIR)as a rescue  medication Plan C = Crisis - only use your albuterol nebulizer if you first try Plan B and it fails to help > ok to use the nebulizer up to every 4 hours but if start needing it regularly call for immediate appointment Continue protonix (pantoprazole) 40 mg Take 30- 60 min before your first and last meals of the day and add Pepcid ac 20 mg at bedtime     10/19/2020  f/u ov/Garrett [name] re: ? Cough variant asthma/ ? chf with BNP 1200 on recent er eval at [facility] > cards w/u there in progress but 43 on repeat 09/29/20  Chief Complaint  Patient presents with  . Follow-up    SOB improved since last ED visit   Dyspnea: flat surfaces slow  Cough: resolved  Sleeping:on CPAP from [facility] /30 degrees electric bed  SABA use: on symbicort  And not using saba now/ worse on trelegy so changed back to symb 80  02: none   No obvious day to day or daytime variability or assoc excess/ purulent sputum or mucus plugs or hemoptysis or cp or chest tightness, subjective wheeze or overt sinus or hb symptoms.   Sleeping fine  without nocturnal  or early am exacerbation  of respiratory  c/o's or need for noct saba. Also denies any obvious fluctuation of symptoms with weather or environmental changes or other aggravating or alleviating factors except as outlined above   No unusual exposure hx or h/o childhood pna/ asthma or knowledge of premature birth.  Current Allergies, Complete Past Medical History, Past Surgical History, Family History, and Social History were reviewed in [employer] record.  ROS  The following are not active complaints unless bolded Hoarseness, sore throat, dysphagia, dental problems, itching, sneezing,  nasal congestion or discharge of excess mucus or purulent secretions, ear ache,   fever, chills, sweats, unintended wt loss or wt gain, classically pleuritic or exertional cp,  orthopnea pnd or arm/hand swelling  or leg swelling, presyncope, palpitations, abdominal pain,  anorexia, nausea, vomiting, diarrhea  or change in bowel habits or change in bladder habits, change in stools or change in urine, dysuria, hematuria,  rash, arthralgias, visual complaints, headache, numbness, weakness or ataxia or problems with walking or coordination,  change  in mood or  memory.        Current Meds - - NOTE:   Unable to verify as accurately reflecting what pt takes     Medication Sig  . acidophilus (RISAQUAD) CAPS capsule Take 1 capsule by mouth daily.   Marland Kitchen albuterol (PROVENTIL) (2.5 MG/3ML) 0.083% nebulizer solution Take 3 mLs (2.5 mg total) by nebulization every 6 (six) hours as needed for wheezing or shortness of breath.  . allopurinol (ZYLOPRIM) 100 MG tablet TAKE 1 TABLET BY MOUTH  DAILY  . atorvastatin (LIPITOR) 40 MG tablet Take 40 mg by mouth daily.  . budesonide-formoterol (SYMBICORT) 80-4.5 MCG/ACT inhaler Inhale into the lungs.  . celecoxib (CELEBREX) 200 MG capsule TAKE 1 CAPSULE BY MOUTH  TWICE DAILY  . Cyanocobalamin 5000 MCG TBDP Take 5,000 mcg by mouth daily.   Marland Kitchen EPINEPHrine (EPIPEN) 0.3 mg/0.3 mL SOAJ injection Inject 0.3 mg into the muscle as needed for anaphylaxis.   . famotidine (PEPCID) 20 MG tablet Take 1 tablet (20 mg total) by mouth at bedtime.  . fenofibrate 160 MG tablet TAKE 1 TABLET BY MOUTH  DAILY  . flurbiprofen (ANSAID) 100 MG tablet Take 1 tablet (100 mg total) by mouth 2 (two) times daily.  . fluticasone (FLONASE) 50 MCG/ACT nasal spray Place 2 sprays into both nostrils daily.  . furosemide (LASIX) 20 MG tablet TAKE 1 TABLET BY MOUTH  DAILY  . gabapentin (NEURONTIN) 100 MG capsule TAKE 2 CAPSULES BY MOUTH AT BEDTIME  . glucose blood (ONE TOUCH ULTRA TEST) test strip USE AS DIRECTED THREE TIMES DAILY.  DX CODE E11.21  . levocetirizine (XYZAL) 5 MG tablet TAKE 1 TABLET BY MOUTH IN  THE EVENING  . magnesium oxide (MAG-OX) 400 (241.3 Mg) MG tablet Take 1 tablet (400 mg total) by mouth daily.  . meclizine (ANTIVERT) 25 MG tablet Take 1 tablet (25 mg  total) by mouth 3 (three) times daily as needed for dizziness. (Patient taking differently: Take 25 mg by mouth as needed for dizziness. )  . memantine (NAMENDA) 10 MG tablet TAKE 2 TABLETS BY MOUTH  DAILY  . metFORMIN (GLUCOPHAGE) 500 MG tablet TAKE 1 TABLET BY MOUTH  TWICE DAILY WITH A MEAL  . metoprolol tartrate (LOPRESSOR) 25 MG tablet TAKE 1 TABLET BY MOUTH  TWICE DAILY  . Multiple Vitamins-Minerals (ONE-A-DAY WEIGHT SMART ADVANCE PO) Take 1 tablet by mouth daily. Centrum Silver  . omeprazole (PRILOSEC) 40 MG capsule Take 1 capsule shortly before breakfast and 1 capsule shortly before dinner meal.  . potassium chloride SA (KLOR-CON) 20 MEQ tablet TAKE 2 TABLETS BY MOUTH  DAILY  . PROAIR HFA 108 (90 Base) MCG/ACT inhaler Inhale 1 puff into the lungs every 6 (six) hours as needed for wheezing or shortness of breath.  . SUMAtriptan (IMITREX) 50 MG tablet Take 1 tablet as needed for migraine/vertigo. Do not take more than 3 a week  . topiramate (TOPAMAX) 50 MG tablet Take 1 tablet (50 mg total) by mouth 2 (two) times daily.  . verapamil (CALAN-SR) 240 MG CR tablet TAKE 1 TABLET BY MOUTH AT  BEDTIME  . XARELTO 20 MG TABS tablet TAKE 1 TABLET BY MOUTH  DAILY WITH SUPPER                              Objective:   Physical Exam   amb obese wm nad   10/19/2020      223 12/12/2017  235 06/17/2017        231  02/10/2017        230  02/03/2017        229  05/02/2016        226  03/21/16 226 lb 3.2 oz (102.604 kg)  03/11/16 222 lb 6.4 oz (100.88 kg)  03/06/16 229 lb (103.874 kg)     Vital signs reviewed  10/19/2020  - Note at rest 02 sats  98% on RA      HEENT : pt wearing mask not removed for exam due to covid -19 concerns.    NECK :  without JVD/Nodes/TM/ nl carotid upstrokes bilaterally   LUNGS: no acc muscle use,  Nl contour chest which is clear to A and P bilaterally without cough on insp or exp maneuvers   CV:  RRR  no s3 or murmur or increase in P2, and no edema    ABD: Obese  soft and nontender with nl inspiratory excursion in the supine position. No bruits or organomegaly appreciated, bowel sounds nl  MS:  Nl gait/ ext warm without deformities, calf tenderness, cyanosis or clubbing No obvious joint restrictions   SKIN: warm and dry without lesions    NEURO:  alert, approp, nl sensorium with  no motor or cerebellar deficits apparent.      I personally reviewed images and agree with radiology impression as follows:  CXR:    09/29/20 Lungs now clear. Heart size normal. No adenopathy. Hiatal hernia present.    Assessment & Plan:

## 2020-10-21 ENCOUNTER — Other Ambulatory Visit: Payer: Self-pay | Admitting: Family Medicine

## 2020-10-21 DIAGNOSIS — R413 Other amnesia: Secondary | ICD-10-CM

## 2020-10-21 DIAGNOSIS — I10 Essential (primary) hypertension: Secondary | ICD-10-CM

## 2020-10-24 ENCOUNTER — Other Ambulatory Visit: Payer: Self-pay | Admitting: Family Medicine

## 2020-10-24 DIAGNOSIS — R059 Cough, unspecified: Secondary | ICD-10-CM

## 2020-10-27 ENCOUNTER — Ambulatory Visit: Payer: Medicare Other | Admitting: Pharmacist

## 2020-10-27 DIAGNOSIS — E1169 Type 2 diabetes mellitus with other specified complication: Secondary | ICD-10-CM

## 2020-10-27 DIAGNOSIS — E1121 Type 2 diabetes mellitus with diabetic nephropathy: Secondary | ICD-10-CM

## 2020-10-27 DIAGNOSIS — I5032 Chronic diastolic (congestive) heart failure: Secondary | ICD-10-CM | POA: Diagnosis not present

## 2020-10-27 DIAGNOSIS — I10 Essential (primary) hypertension: Secondary | ICD-10-CM

## 2020-10-27 DIAGNOSIS — R0600 Dyspnea, unspecified: Secondary | ICD-10-CM | POA: Diagnosis not present

## 2020-10-27 NOTE — Chronic Care Management (AMB) (Signed)
Chronic Care Management Pharmacy  Name: Garrett [name]  MRN: [id number] DOB: November 26, 1951  Chief Complaint/ HPI  Garrett [name],  69 y.o. , male presents for their Follow-Up CCM visit with the clinical pharmacist via telephone due to COVID-19 Pandemic.  PCP : Ann Held, DO  Their chronic conditions include: DM, HLD, HF, HTN, Hx of PE, Asthma, GERD, Dementia, Migraine, Allergies, Gout, Pain, Vertigo  Office Visits: 08/17/20: Visit w/ Dr. Etter Sjogren - Flurbirofen for arthritis. Trelegy prescribed?  Consult Visit: 10/19/20: Pulm visit w/ Dr. Melvyn Novas: Provider discontinued Amoxicillin 875-125 mg and Fluticasone.  Patient received Fluad quad High Dose.   09/08/20: Cardio visit w/ Dr. Lamonte Sakai - Acute on chronic diastolic HF: increase furosemide to 40mg  daily. D/C pravastatin and start atorvastatin.  ED Visit:  09/29/20-10/01/20: [facility] - SOB/Chest Pain: ordered ntiroglycerin as needed for patient.  Medications: Outpatient Encounter Medications as of 10/27/2020  Medication Sig Note  . atorvastatin (LIPITOR) 40 MG tablet Take 40 mg by mouth daily.   . budesonide-formoterol (SYMBICORT) 80-4.5 MCG/ACT inhaler Inhale into the lungs.   . celecoxib (CELEBREX) 200 MG capsule TAKE 1 CAPSULE BY MOUTH  TWICE DAILY   . furosemide (LASIX) 20 MG tablet TAKE 1 TABLET BY MOUTH  DAILY 10/02/2020: 40mg  daily. Increased per Dr. Lamonte Sakai 09/08/20  . acidophilus (RISAQUAD) CAPS capsule Take 1 capsule by mouth daily.    Marland Kitchen albuterol (PROVENTIL) (2.5 MG/3ML) 0.083% nebulizer solution Take 3 mLs (2.5 mg total) by nebulization every 6 (six) hours as needed for wheezing or shortness of breath.   . allopurinol (ZYLOPRIM) 100 MG tablet TAKE 1 TABLET BY MOUTH  DAILY   . Cyanocobalamin 5000 MCG TBDP Take 5,000 mcg by mouth daily.  01/08/2019: B-12  . EPINEPHrine (EPIPEN) 0.3 mg/0.3 mL SOAJ injection Inject 0.3 mg into the muscle as needed for anaphylaxis.    . famotidine (PEPCID) 20 MG tablet Take 1 tablet  (20 mg total) by mouth at bedtime.   . fenofibrate 160 MG tablet TAKE 1 TABLET BY MOUTH  DAILY   . flurbiprofen (ANSAID) 100 MG tablet Take 1 tablet (100 mg total) by mouth 2 (two) times daily. (Patient not taking: Reported on 10/27/2020)   . fluticasone (FLONASE) 50 MCG/ACT nasal spray Place 2 sprays into both nostrils daily.   Marland Kitchen gabapentin (NEURONTIN) 100 MG capsule TAKE 2 CAPSULES BY MOUTH AT BEDTIME   . glucose blood (ONE TOUCH ULTRA TEST) test strip USE AS DIRECTED THREE TIMES DAILY.  DX CODE E11.21   . levocetirizine (XYZAL) 5 MG tablet Take 1 tablet (5 mg total) by mouth every evening.   . magnesium oxide (MAG-OX) 400 (241.3 Mg) MG tablet Take 1 tablet (400 mg total) by mouth daily.   . meclizine (ANTIVERT) 25 MG tablet Take 1 tablet (25 mg total) by mouth 3 (three) times daily as needed for dizziness. (Patient taking differently: Take 25 mg by mouth as needed for dizziness. )   . memantine (NAMENDA) 10 MG tablet TAKE 2 TABLETS BY MOUTH  DAILY   . metFORMIN (GLUCOPHAGE) 500 MG tablet TAKE 1 TABLET BY MOUTH  TWICE DAILY WITH A MEAL   . metoprolol tartrate (LOPRESSOR) 25 MG tablet TAKE 1 TABLET BY MOUTH  TWICE DAILY   . Multiple Vitamins-Minerals (ONE-A-Bluma Buresh WEIGHT SMART ADVANCE PO) Take 1 tablet by mouth daily. Centrum Silver   . omeprazole (PRILOSEC) 40 MG capsule Take 1 capsule shortly before breakfast and 1 capsule shortly before dinner meal.   . potassium  chloride SA (KLOR-CON) 20 MEQ tablet TAKE 2 TABLETS BY MOUTH  DAILY   . PROAIR HFA 108 (90 Base) MCG/ACT inhaler Inhale 1 puff into the lungs every 6 (six) hours as needed for wheezing or shortness of breath.   . SUMAtriptan (IMITREX) 50 MG tablet Take 1 tablet as needed for migraine/vertigo. Do not take more than 3 a week   . topiramate (TOPAMAX) 50 MG tablet Take 1 tablet (50 mg total) by mouth 2 (two) times daily.   . verapamil (CALAN-SR) 240 MG CR tablet TAKE 1 TABLET BY MOUTH AT  BEDTIME   . XARELTO 20 MG TABS tablet TAKE 1 TABLET BY  MOUTH  DAILY WITH SUPPER    No facility-administered encounter medications on file as of 10/27/2020.   SDOH Screenings   Alcohol Screen:   . Last Alcohol Screening Score (AUDIT): Not on file  Depression (PHQ2-9): Low Risk   . PHQ-2 Score: 0  Financial Resource Strain: Low Risk   . Difficulty of Paying Living Expenses: Not hard at all  Food Insecurity:   . Worried About Charity fundraiser in the Last Year: Not on file  . Ran Out of Food in the Last Year: Not on file  Housing:   . Last Housing Risk Score: Not on file  Physical Activity:   . Days of Exercise per Week: Not on file  . Minutes of Exercise per Session: Not on file  Social Connections:   . Frequency of Communication with Friends and Family: Not on file  . Frequency of Social Gatherings with Friends and Family: Not on file  . Attends Religious Services: Not on file  . Active Member of Clubs or Organizations: Not on file  . Attends Archivist Meetings: Not on file  . Marital Status: Not on file  Stress:   . Feeling of Stress : Not on file  Tobacco Use: High Risk  . Smoking Tobacco Use: Former Smoker  . Smokeless Tobacco Use: Current User  Transportation Needs: No Transportation Needs  . Lack of Transportation (Medical): No  . Lack of Transportation (Non-Medical): No      Current Diagnosis/Assessment:  Goals Addressed            This Visit's Progress   . Chronic Care Management Pharmacy Care Plan       CARE PLAN ENTRY (see longitudinal plan of care for additional care plan information)  Current Barriers:  . Chronic Disease Management support, education, and care coordination needs related to DM, HLD, HF, HTN, Hx of PE, Asthma, GERD, Dementia, Migraine, Allergies, Gout, Pain, Vertigo   Hypertension BP Readings from Last 3 Encounters:  10/19/20 (!) 150/82  09/29/20 138/83  09/02/20 117/66   . Pharmacist Clinical Goal(s): o Over the next 90 days, patient will work with PharmD and providers to  maintain BP goal <140/90 . Current regimen:   Verapamil 240mg  daily HS  Metoprolol tartrate 25mg  BID . Interventions: o Discussed BP goal . Patient self care activities - Over the next 90 days, patient will: o Check BP 2-3 time per week, document, and provide at future appointments o Ensure daily salt intake < 2300 mg/Lumen Brinlee  Hyperlipidemia Lab Results  Component Value Date/Time   LDLCALC 129 (H) 08/17/2020 09:48 AM   LDLCALC 99 07/18/2015 07:44 AM   LDLDIRECT 104.0 02/14/2020 10:25 AM   . Pharmacist Clinical Goal(s): o Over the next 90 days, patient will work with PharmD and providers to achieve LDL goal < 100 .  Current regimen:   Atorvastatin 40mg  daily  Fenofibrate 160mg  daily . Interventions: o Discussed LDL goal . Patient self care activities - Over the next 90 days, patient will: o Maintain cholesterol medication regimen.  Diabetes Lab Results  Component Value Date/Time   HGBA1C 6.3 (H) 08/17/2020 09:48 AM   HGBA1C 6.4 05/17/2020 07:56 AM   . Pharmacist Clinical Goal(s): o Over the next 90 days, patient will work with PharmD and providers to maintain A1c goal <7% . Current regimen:  o Metformin 500mg  BID . Interventions: o Discussed DM goal . Patient self care activities - Over the next 90 days, patient will: o Check blood sugar once daily, document, and provide at future appointments o Contact provider with any episodes of hypoglycemia  Medication management . Pharmacist Clinical Goal(s): o Over the next 90 days, patient will work with PharmD and providers to maintain optimal medication adherence . Current pharmacy: [facility] . Interventions o Comprehensive medication review performed. o Continue current medication management strategy . Patient self care activities - Over the next 90 days, patient will: o Focus on medication adherence by filling and taking medications appropriately  o Take medications as prescribed o Report any questions  or concerns to PharmD and/or provider(s)  Please see past updates related to this goal by clicking on the "Past Updates" button in the selected goal         Social Hx:  Married 51 years. Has a son that lives locally. Wife had open heart surgery 10 days ago.  Uses pill box  Diabetes   A1c goal <7 FBG 80-130 PPBG <180  Recent Relevant Labs: Lab Results  Component Value Date/Time   HGBA1C 6.3 (H) 08/17/2020 09:48 AM   HGBA1C 6.4 05/17/2020 07:56 AM   MICROALBUR 0.2 08/17/2020 09:48 AM   MICROALBUR <0.7 04/09/2019 09:01 AM    Checking BG: Daily  Recent FBG Readings: 140-160s No s/sx of hypoglycemia. "I have never seen my number under 100" Highest: 185  Lowest: 130 Patient is currently controlled on the following medications:   Metformin 500mg  BID  Last diabetic Eye exam:  Lab Results  Component Value Date/Time   HMDIABEYEEXA No Retinopathy 12/21/2018 12:34 PM    Last diabetic Foot exam: No results found for: HMDIABFOOTEX   Update 10/27/20 States BP running from 120-140s fasting  Plan -Continue current medications   Hyperlipidemia   LDL goal <100  Lipid Panel     Component Value Date/Time   CHOL 207 (H) 08/17/2020 0948   CHOL 162 07/18/2015 0744   TRIG 135 08/17/2020 0948   TRIG 110 07/18/2015 0744   HDL 53 08/17/2020 0948   HDL 41 07/18/2015 0744   CHOLHDL 3.9 08/17/2020 0948   VLDL 37.8 05/17/2020 0756   LDLCALC 129 (H) 08/17/2020 0948   LDLCALC 99 07/18/2015 0744   LDLDIRECT 104.0 02/14/2020 1025    The 10-year ASCVD risk score (Goff DC Jr., et al., 2013) is: 44.9%   Values used to calculate the score:     Age: 9 years     Sex: Male     Is Non-Hispanic African American: No     Diabetic: Yes     Tobacco smoker: No     Systolic Blood Pressure: 098 mmHg     Is BP treated: Yes     HDL Cholesterol: 53 mg/dL     Total Cholesterol: 207 mg/dL   Patient has failed these meds in past: None noted  Patient is currently uncontrolled,  except for TG on  the following medications:   Atorvastatin 40mg  daily  Fenofibrate 160mg  daily  We discussed:  diet and exercise extensively   Update 10/27/20 Pravastatin changed to atorvastatin 09/08/20 per cardio Tolerating atorvastatin well. Would benefit from updated lipid panel.   Plan -Continue current medications    Heart Failure   Type: Diastolic  Last ejection fraction: 03/03/17 EF: 65-70% NYHA Class: II (slight limitation of activity) AHA HF Stage: B (Heart disease present - no symptoms present)  Patient has failed these meds in past: None noted  Patient is currently controlled on the following medications:   Furosemide 40mg  daily (increased from 20mg  at visit with Dr. Lamonte Sakai on 09/08/20)  Potassium chloride 65mEq BID  Update 10/27/20 States his weight is down.  He has had minimal edema since increasing furosemide dose.  Minimal SOB since last admission   Plan -Continue current medications  Hypertension   Goal <140/90  CMP Latest Ref Rng & Units 09/02/2020 08/17/2020 05/17/2020  Glucose 70 - 99 mg/dL 146(H) 119(H) 125(H)  BUN 8 - 23 mg/dL 15 21 22   Creatinine 0.61 - 1.24 mg/dL 1.35(H) 1.30(H) 1.30  Sodium 135 - 145 mmol/L 144 142 142  Potassium 3.5 - 5.1 mmol/L 4.0 3.9 3.7  Chloride 98 - 111 mmol/L 112(H) 109 110  CO2 22 - 32 mmol/L 22 23 22   Calcium 8.9 - 10.3 mg/dL 9.4 9.6 9.6  Total Protein 6.1 - 8.1 g/dL - 6.6 6.6  Total Bilirubin 0.2 - 1.2 mg/dL - 0.5 0.3  Alkaline Phos 39 - 117 U/L - - 51  AST 10 - 35 U/L - 34 25  ALT 9 - 46 U/L - 55(H) 43   Office blood pressures are  BP Readings from Last 3 Encounters:  10/19/20 (!) 150/82  09/29/20 138/83  09/02/20 117/66   Patient has failed these meds in the past: irbesartan, ramipril (concern for cough?), terazosin (stopped due to low BP) Patient is currently controlled on the following medications:   Verapamil 240mg  daily HS  Metoprolol tartrate 25mg  BID  Patient checks BP at home 3-5x per week  Patient home BP  readings are ranging: 130s-140s/80s-90s No chest pains  Update 10/27/20 Reports his BP is running high.  Running around 150/90-95.  Has a cardio visit this afternoon.   Plan -Continue current medications   Asthma / Tobacco   Eosinophil count:   Lab Results  Component Value Date/Time   EOSPCT 10.2 (H) 01/06/2019 01:11 PM   EOSPCT 5.4 12/04/2017 02:18 PM  %                               Eos (Absolute):  Lab Results  Component Value Date/Time   EOSABS 0.9 (H) 01/06/2019 01:11 PM   EOSABS 0.5 12/04/2017 02:18 PM    Tobacco Status:  Social History   Tobacco Use  Smoking Status Former Smoker  . Packs/Xareni Kelch: 3.00  . Years: 30.00  . Pack years: 90.00  . Types: Cigarettes  . Quit date: 01/09/1991  . Years since quitting: 29.8  Smokeless Tobacco Current User  . Types: Snuff    Patient has failed these meds in past: None noted  Patient is currently controlled on the following medications:   Symbicort 80-4.5mg  2 puffs once daily (prescribed twice daily. Will only use twice daily during allergy season)  Proair PRN  Albuterol nebs (when he gets bronchitis and will use for a [name] Delsignore or two)  Using maintenance inhaler regularly? Yes Frequency of rescue inhaler use:  1-2x per week (especially during spring months)  Followed by Dr. Melvyn Novas Feels SOB with exertion/PT  We discussed:  Making sure to rinse mouth out after using Symbicort and using Symbicort twice daily as prescribed   Update 10/27/20 Which inhaler using? Symbicort. She tried Trelegy for a couple of weeks, but this did not work well for him.   Plan -Continue current medications  -Use symbicort twice daily with goal to use albuterol less frequently    Neuropathy    Patient has failed these meds in past: None noted Patient is currently controlled on the following medications:   Gabapentin 100mg  daily HS  Gabapentin: Was not sure why he was taking this medication. Not sure if it helping with anything  Prescribed  by Dr. Tamala Julian (ortho) when he was having problem with hip and knees. Was getting cortisone shots for this.  We discussed:  Risk benefit of continuing vs discontinuing medication   Update 10/27/20 States he spoke with Dr. Tamala Julian and he has decided to continue therapy.  When he tried to stop his legs ache worse.  Plan -Continue current medications for now    Pain    Patient has failed these meds in past: None noted  Patient is currently controlled on the following medications: celecoxib 200mg  #2 daily  Usual Pain Score: 6-7 Gets worse with sitting in one position   Does not feel celecoxib is working any more He tried taking 1 BID, but that was even less effective than #2 at one time Wondering if there is a better alternative for him  We discussed:  Risk/benefit of D/C'ing celecoxib noting that he is currently still in pain which could or could not get worse if he D/C   Update 10/27/20 Tried flurbiprofen, but this didn't help him. Went back to celebrex  Plan -Continue current medications  -Will consider other possible alternatives   Miscellaneous Meds Ester-C 1000mg  daily   De Blanch, PharmD Clinical Pharmacist [facility] at [facility] [phone]

## 2020-10-27 NOTE — Patient Instructions (Addendum)
Visit Information  Goals Addressed            This Visit's Progress   . Chronic Care Management Pharmacy Care Plan       CARE PLAN ENTRY (see longitudinal plan of care for additional care plan information)  Current Barriers:  . Chronic Disease Management support, education, and care coordination needs related to DM, HLD, HF, HTN, Hx of PE, Asthma, GERD, Dementia, Migraine, Allergies, Gout, Pain, Vertigo   Hypertension BP Readings from Last 3 Encounters:  10/19/20 (!) 150/82  09/29/20 138/83  09/02/20 117/66   . Pharmacist Clinical Goal(s): o Over the next 90 days, patient will work with PharmD and providers to maintain BP goal <140/90 . Current regimen:   Verapamil 240mg  daily HS  Metoprolol tartrate 25mg  BID . Interventions: o Discussed BP goal . Patient self care activities - Over the next 90 days, patient will: o Check BP 2-3 time per week, document, and provide at future appointments o Ensure daily salt intake < 2300 mg/Donell Sliwinski  Hyperlipidemia Lab Results  Component Value Date/Time   LDLCALC 129 (H) 08/17/2020 09:48 AM   LDLCALC 99 07/18/2015 07:44 AM   LDLDIRECT 104.0 02/14/2020 10:25 AM   . Pharmacist Clinical Goal(s): o Over the next 90 days, patient will work with PharmD and providers to achieve LDL goal < 100 . Current regimen:   Atorvastatin 40mg  daily  Fenofibrate 160mg  daily . Interventions: o Discussed LDL goal . Patient self care activities - Over the next 90 days, patient will: o Maintain cholesterol medication regimen.  Diabetes Lab Results  Component Value Date/Time   HGBA1C 6.3 (H) 08/17/2020 09:48 AM   HGBA1C 6.4 05/17/2020 07:56 AM   . Pharmacist Clinical Goal(s): o Over the next 90 days, patient will work with PharmD and providers to maintain A1c goal <7% . Current regimen:  o Metformin 500mg  BID . Interventions: o Discussed DM goal . Patient self care activities - Over the next 90 days, patient will: o Check blood sugar once daily,  document, and provide at future appointments o Contact provider with any episodes of hypoglycemia  Medication management . Pharmacist Clinical Goal(s): o Over the next 90 days, patient will work with PharmD and providers to maintain optimal medication adherence . Current pharmacy: [facility] . Interventions o Comprehensive medication review performed. o Continue current medication management strategy . Patient self care activities - Over the next 90 days, patient will: o Focus on medication adherence by filling and taking medications appropriately  o Take medications as prescribed o Report any questions or concerns to PharmD and/or provider(s)  Please see past updates related to this goal by clicking on the "Past Updates" button in the selected goal         The patient verbalized understanding of instructions provided today and agreed to receive a mailed copy of patient instruction and/or educational materials.  Telephone follow up appointment with pharmacy team member scheduled for: 01/29/2021  De Blanch, PharmD Clinical Pharmacist [facility] at [facility] [phone]

## 2020-11-09 DIAGNOSIS — R0600 Dyspnea, unspecified: Secondary | ICD-10-CM | POA: Diagnosis not present

## 2020-11-14 DIAGNOSIS — L570 Actinic keratosis: Secondary | ICD-10-CM | POA: Diagnosis not present

## 2020-11-15 ENCOUNTER — Other Ambulatory Visit (INDEPENDENT_AMBULATORY_CARE_PROVIDER_SITE_OTHER): Payer: Medicare Other

## 2020-11-15 ENCOUNTER — Other Ambulatory Visit: Payer: Self-pay | Admitting: Family Medicine

## 2020-11-15 ENCOUNTER — Other Ambulatory Visit: Payer: Self-pay

## 2020-11-15 DIAGNOSIS — E785 Hyperlipidemia, unspecified: Secondary | ICD-10-CM | POA: Diagnosis not present

## 2020-11-15 DIAGNOSIS — E1169 Type 2 diabetes mellitus with other specified complication: Secondary | ICD-10-CM

## 2020-11-15 DIAGNOSIS — E1165 Type 2 diabetes mellitus with hyperglycemia: Secondary | ICD-10-CM

## 2020-11-15 LAB — LIPID PANEL
Cholesterol: 148 mg/dL (ref 0–200)
HDL: 42.5 mg/dL (ref >39.00)
LDL Cholesterol: 74 mg/dL (ref 0–99)
NonHDL: 105.7
Total CHOL/HDL Ratio: 3
Triglycerides: 160 mg/dL — ABNORMAL HIGH (ref 0.0–149.0)
VLDL: 32 mg/dL (ref 0.0–40.0)

## 2020-11-15 LAB — COMPREHENSIVE METABOLIC PANEL
ALT: 51 U/L (ref 0–53)
AST: 29 U/L (ref 0–37)
Albumin: 4.1 g/dL (ref 3.5–5.2)
Alkaline Phosphatase: 41 U/L (ref 39–117)
BUN: 19 mg/dL (ref 6–23)
CO2: 26 mEq/L (ref 19–32)
Calcium: 9.5 mg/dL (ref 8.4–10.5)
Chloride: 107 mEq/L (ref 96–112)
Creatinine, Ser: 1.24 mg/dL (ref 0.40–1.50)
GFR: 58.87 mL/min — ABNORMAL LOW (ref >60.00)
Glucose, Bld: 113 mg/dL — ABNORMAL HIGH (ref 70–99)
Potassium: 4.1 mEq/L (ref 3.5–5.1)
Sodium: 140 mEq/L (ref 135–145)
Total Bilirubin: 0.4 mg/dL (ref 0.2–1.2)
Total Protein: 6.5 g/dL (ref 6.0–8.3)

## 2020-11-15 LAB — HEMOGLOBIN A1C: Hgb A1c MFr Bld: 6.7 % — ABNORMAL HIGH (ref 4.6–6.5)

## 2020-12-01 ENCOUNTER — Telehealth: Payer: Self-pay | Admitting: Pharmacist

## 2020-12-01 ENCOUNTER — Other Ambulatory Visit: Payer: Self-pay

## 2020-12-01 ENCOUNTER — Encounter: Payer: Self-pay | Admitting: Podiatry

## 2020-12-01 ENCOUNTER — Ambulatory Visit: Payer: Medicare Other | Admitting: Podiatry

## 2020-12-01 DIAGNOSIS — M7752 Other enthesopathy of left foot: Secondary | ICD-10-CM | POA: Diagnosis not present

## 2020-12-01 DIAGNOSIS — E1121 Type 2 diabetes mellitus with diabetic nephropathy: Secondary | ICD-10-CM | POA: Diagnosis not present

## 2020-12-01 DIAGNOSIS — M79675 Pain in left toe(s): Secondary | ICD-10-CM

## 2020-12-01 DIAGNOSIS — R601 Generalized edema: Secondary | ICD-10-CM

## 2020-12-01 DIAGNOSIS — B351 Tinea unguium: Secondary | ICD-10-CM

## 2020-12-01 DIAGNOSIS — M7751 Other enthesopathy of right foot: Secondary | ICD-10-CM | POA: Diagnosis not present

## 2020-12-01 DIAGNOSIS — M79674 Pain in right toe(s): Secondary | ICD-10-CM | POA: Diagnosis not present

## 2020-12-01 MED ORDER — TRIAMCINOLONE ACETONIDE 10 MG/ML IJ SUSP
10.0000 mg | Freq: Once | INTRAMUSCULAR | Status: AC
  Administered 2020-12-01: 10 mg via INTRA_ARTICULAR

## 2020-12-01 NOTE — Progress Notes (Signed)
Subjective:  Patient ID: Garrett [name], male    DOB: November 17, 1951,  MRN: [id number]  Chief Complaint  Patient presents with  . Foot Pain    Bilateral foot pain. PT stated that he is doing fair he is still having some pain and tenderness. Has swelling at night.    69 y.o. male returns for the above complaint.  Patient presents with thickened elongated dystrophic toenails x10.  Patient would like to debride them down as he is not able to do it ASAP.  He denies any other acute complaints.  Patient is a diabetic with last A1c of 6.3.  He also has secondary complaint of bilateral lower extremity edema and discuss treatment options for it.  He has not tried anything.  He has been on his feet a lot.  He also has tertiary complaint of bilateral second metatarsophalangeal joint pain has been going on for quite some time especially with ambulation.  He has not done anything for it.  He is made some shoe gear modification which has not helped.  Objective:  There were no vitals filed for this visit. Podiatric Exam: Vascular: dorsalis pedis and posterior tibial pulses are palpable bilateral. Capillary return is immediate. Temperature gradient is WNL. Skin turgor WNL  Sensorium: Normal Semmes Weinstein monofilament test. Normal tactile sensation bilaterally. Nail Exam: Pt has thick disfigured discolored nails with subungual debris noted bilateral entire nail hallux through fifth toenails.  Pain on palpation to the nails. Ulcer Exam: There is no evidence of ulcer or pre-ulcerative changes or infection. Orthopedic Exam: Muscle tone and strength are WNL. No limitations in general ROM. No crepitus or effusions noted. HAV  B/L.  Hammer toes 2-5  B/L.  Pain on palpation with range of motion of the second digit bilaterally.  No intra-articular pain noted.  No extensor or flexor tendinitis clinically appreciated. Skin: No Porokeratosis. No infection or ulcers.  Generalized edema noted 1+ pitting edema to both lower  extremity at the ankles and the level of the forefoot.    Assessment & Plan:   1. Generalized edema   2. Controlled type 2 diabetes mellitus with diabetic nephropathy, without long-term current use of insulin (HCC)   3. Pain due to onychomycosis of toenails of both feet   4. Capsulitis of metatarsophalangeal (MTP) joint of right foot   5. Capsulitis of metatarsophalangeal (MTP) joint of left foot     Patient was evaluated and treated and all questions answered.  Bilateral second metatarsophalangeal joint capsulitis -I explained to patient the etiology of capsulitis versus treatment options were discussed.  I believe patient will benefit from a steroid injection help decrease acute inflammatory component associated pain.  Patient agrees with the plan like to proceed with a steroid injection. -A steroid injection was performed at bilateral second metatarsophalangeal joint using 1% plain Lidocaine and 10 mg of Kenalog. This was well tolerated.   Generalized edema to lower extremity -I explained to the patient the etiology of edema and various treatment options were discussed.  I discussed with the patient that given that there is generalized swelling to the lower extremity that resolves at night patient may have dependent edema.  I discussed with him that compression socks and elevation can help with that.  Patient states understanding will do so  Xerosis bilateral lower extremity -I explained to the patient the etiology of xerosis and various treatment options were extensively discussed.  I explained to the patient the importance of maintaining moisturization of the skin with application  of over-the-counter lotion such as Eucerin or Luciderm.  I have asked the patient to apply this twice a day.  If unable to resolve patient will benefit from prescription lotion.  Onychomycosis with pain  -Nails palliatively debrided as below. -Educated on self-care  Procedure: Nail Debridement Rationale:  pain  Type of Debridement: manual, sharp debridement. Instrumentation: Nail nipper, rotary burr. Number of Nails: 10  Procedures and Treatment: Consent by patient was obtained for treatment procedures. The patient understood the discussion of treatment and procedures well. All questions were answered thoroughly reviewed. Debridement of mycotic and hypertrophic toenails, 1 through 5 bilateral and clearing of subungual debris. No ulceration, no infection noted.  Return Visit-Office Procedure: Patient instructed to return to the office for a follow up visit 3 months for continued evaluation and treatment.  Boneta Lucks, DPM    No follow-ups on file.

## 2020-12-01 NOTE — Progress Notes (Addendum)
Chronic Care Management Pharmacy Assistant   Name: Garrett [name]  MRN: [id number] DOB: May 16, 1951  Reason for Encounter: [facility] Disease State  Patient Questions:  1.  Have you seen any other providers since your last visit? Yes  2.  Any changes in your medicines or health? No    PCP : Ann Held, DO   Their chronic conditions include: DM, HLD, HF, HTN, Hx of PE, Asthma, GERD, Dementia, Migraine, Allergies, Gout, Pain, Vertigo  Office Visits: None since their last CCM visit with the clinical pharmacist on 10-27-2020.  Consults: 12-01-2020 ([place]) Patient presented in the office c/o bilateral foot pain and has some tenderness with swelling at night. A steroid injection was performed. Advised to use compression socks and elevate feet.  11-09-2020 ([facility]) Cardiac Ultrasound; Lexiscan  10-27-2020 ([facility]) Patient presented in the office for f/u after recent hospitalization for chest pain and shortness of breath. No medication changes.  Allergies:   Allergies  Allergen Reactions   Bee Venom Anaphylaxis   Garlic Swelling    Medications: Outpatient Encounter Medications as of 12/01/2020  Medication Sig Note   acidophilus (RISAQUAD) CAPS capsule Take 1 capsule by mouth daily.     albuterol (PROVENTIL) (2.5 MG/3ML) 0.083% nebulizer solution Take 3 mLs (2.5 mg total) by nebulization every 6 (six) hours as needed for wheezing or shortness of breath.    allopurinol (ZYLOPRIM) 100 MG tablet TAKE 1 TABLET BY MOUTH  DAILY    atorvastatin (LIPITOR) 40 MG tablet Take 40 mg by mouth daily.    budesonide-formoterol (SYMBICORT) 80-4.5 MCG/ACT inhaler Inhale into the lungs.    celecoxib (CELEBREX) 200 MG capsule TAKE 1 CAPSULE BY MOUTH  TWICE DAILY    Cyanocobalamin 5000 MCG TBDP Take 5,000 mcg by mouth daily.  01/08/2019: B-12   EPINEPHrine (EPIPEN) 0.3 mg/0.3 mL SOAJ injection Inject 0.3 mg into the muscle as needed for anaphylaxis.     famotidine (PEPCID) 20 MG  tablet Take 1 tablet (20 mg total) by mouth at bedtime.    fenofibrate 160 MG tablet TAKE 1 TABLET BY MOUTH  DAILY    flurbiprofen (ANSAID) 100 MG tablet Take 1 tablet (100 mg total) by mouth 2 (two) times daily. (Patient not taking: Reported on 10/27/2020)    fluticasone (FLONASE) 50 MCG/ACT nasal spray Place 2 sprays into both nostrils daily.    furosemide (LASIX) 20 MG tablet TAKE 1 TABLET BY MOUTH  DAILY 10/02/2020: 40mg  daily. Increased per Dr. Lamonte Sakai 09/08/20   gabapentin (NEURONTIN) 100 MG capsule TAKE 2 CAPSULES BY MOUTH AT BEDTIME    glucose blood (ONE TOUCH ULTRA TEST) test strip USE AS DIRECTED THREE TIMES DAILY.  DX CODE E11.21    levocetirizine (XYZAL) 5 MG tablet Take 1 tablet (5 mg total) by mouth every evening.    magnesium oxide (MAG-OX) 400 (241.3 Mg) MG tablet Take 1 tablet (400 mg total) by mouth daily.    meclizine (ANTIVERT) 25 MG tablet Take 1 tablet (25 mg total) by mouth 3 (three) times daily as needed for dizziness. (Patient taking differently: Take 25 mg by mouth as needed for dizziness. )    memantine (NAMENDA) 10 MG tablet TAKE 2 TABLETS BY MOUTH  DAILY    metFORMIN (GLUCOPHAGE) 500 MG tablet TAKE 1 TABLET BY MOUTH  TWICE DAILY WITH A MEAL    metoprolol tartrate (LOPRESSOR) 25 MG tablet TAKE 1 TABLET BY MOUTH  TWICE DAILY    Multiple Vitamins-Minerals (ONE-A-DAY WEIGHT SMART ADVANCE PO)  Take 1 tablet by mouth daily. Centrum Silver    omeprazole (PRILOSEC) 40 MG capsule Take 1 capsule shortly before breakfast and 1 capsule shortly before dinner meal.    potassium chloride SA (KLOR-CON) 20 MEQ tablet TAKE 2 TABLETS BY MOUTH  DAILY    PROAIR HFA 108 (90 Base) MCG/ACT inhaler Inhale 1 puff into the lungs every 6 (six) hours as needed for wheezing or shortness of breath.    SUMAtriptan (IMITREX) 50 MG tablet Take 1 tablet as needed for migraine/vertigo. Do not take more than 3 a week    topiramate (TOPAMAX) 50 MG tablet Take 1 tablet (50 mg total) by mouth 2 (two) times daily.     verapamil (CALAN-SR) 240 MG CR tablet TAKE 1 TABLET BY MOUTH AT  BEDTIME    XARELTO 20 MG TABS tablet TAKE 1 TABLET BY MOUTH  DAILY WITH SUPPER    No facility-administered encounter medications on file as of 12/01/2020.    Current Diagnosis: Patient Active Problem List   Diagnosis Date Noted   Laceration of right hand 08/02/2019   Acute pulmonary embolism ([place]) 01/07/2019   Chronic diastolic CHF (congestive heart failure) ([place]) 01/07/2019   Preventative health care 02/10/2018   Dizziness 02/10/2018   Spondylolisthesis at L3-L4 level 10/28/2017   Cough variant asthma  vs uacs/ pseudoasthma 06/17/2017   Pulmonary embolism and infarction ([place]) 02/09/2017   Bilateral pulmonary embolism ([place]) 02/04/2017   Greater trochanteric bursitis of left hip 01/14/2017   Greater trochanteric bursitis of right hip 12/20/2016   Degenerative arthritis of knee, bilateral 10/08/2016   Upper airway cough syndrome 03/22/2016   Multiple pulmonary nodules 03/22/2016   Acute bronchitis 01/22/2016   Acute upper respiratory infection 10/25/2015   Elevated CK 07/18/2015   History of colonic polyps 04/11/2015   Lumbar stenosis with neurogenic claudication 11/14/2014   Spondylolysis of cervical region 02/25/2014   OSA (obstructive sleep apnea) 12/08/2013   DOE (dyspnea on exertion) 11/04/2013   Morbid obesity due to excess calories (HCC)    Cervicalgia    Elevated PSA, less than 10 ng/ml 03/23/2013   Venous insufficiency of leg 02/18/2012   Itching 02/18/2012   Mild dementia ([place]) 05/28/2011   Hyperlipidemia associated with type 2 diabetes mellitus ([place]) 05/27/2011   Controlled type 2 diabetes mellitus with diabetic nephropathy ([place]) 04/10/2011   Gout 04/10/2011   Essential hypertension 04/10/2011   OA (osteoarthritis) 04/10/2011   GERD (gastroesophageal reflux disease) 04/10/2011   Migraine headache without aura 04/10/2011   Allergic rhinitis, cause unspecified 04/10/2011   Bee sting allergy  04/10/2011    Goals Addressed   None    12/01/2020 Name: Garrett [name] MRN: [id number] DOB: [birth year] Garrett [name] is a 69 y.o. year old male who is a primary care patient of Ann Held, DO.  Comprehensive medication review performed; Spoke to patient regarding cholesterol  Lipid Panel    Component Value Date/Time   CHOL 148 11/15/2020 0809   CHOL 162 07/18/2015 0744   TRIG 160.0 (H) 11/15/2020 0809   TRIG 110 07/18/2015 0744   HDL 42.50 11/15/2020 0809   HDL 41 07/18/2015 0744   LDLCALC 74 11/15/2020 0809   LDLCALC 129 (H) 08/17/2020 0948   LDLCALC 99 07/18/2015 0744   LDLDIRECT 104.0 02/14/2020 1025    10-year ASCVD risk score: The 10-year ASCVD risk score Mikey Bussing DC Jr., et al., 2013) is: 39.8%   Values used to calculate the score:     Age: 71 years  Sex: Male     Is Non-Hispanic African American: No     Diabetic: Yes     Tobacco smoker: No     Systolic Blood Pressure: 875 mmHg     Is BP treated: Yes     HDL Cholesterol: 42.5 mg/dL     Total Cholesterol: 148 mg/dL  Current antihyperlipidemic regimen:  Atorvastatin 40 mg daily Fenofibrate 160 mg daily  Previous antihyperlipidemic medications tried: Pravastatin  ASCVD risk enhancing conditions: age >6, DM and HTN   What recent interventions/DTPs have been made by any provider to improve Cholesterol control since last CPP Visit: none  Any recent hospitalizations or ED visits since last visit with CPP? No   What diet changes have been made to improve Cholesterol?  Has cut all sweets from his diet  What exercise is being done to improve Cholesterol?  Patient is active. 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   Patient is concerned with his sugar levels. In the morning his reading have been 150-175 fasting. He is unable to tolerate metformin. He would like more information on another medication that can possibly help him control his blood sugar better. Does report taking Victoza  in the past.  He is also interested in a continuous glucose monitoring system.   Adherence Review: Does the patient have >5 day gap between last estimated fill dates? No   Follow-Up:  Pharmacist Review   Fanny Skates, [place] Pharmacist Assistant [phone]  Noted pt's most recent lipid panel is now at goal.  Also note his a1c has increased, but is still at goal.  Would consider metformin titration if tolerated before initiating additional medication for diabetes.  If next a1c >7% consider increasing metformin to max dose. Because patient is not on injectable diabetes medication, he would not qualify to have CGM covered through his insurance and discuss out of pocket price for CGM.  10 minutes spent in review, coordination, and documentation.   Reviewed by: De Blanch, PharmD, BCACP Clinical Pharmacist [facility] [phone]

## 2020-12-09 ENCOUNTER — Encounter: Payer: Self-pay | Admitting: Family Medicine

## 2020-12-11 NOTE — Pre-Procedure Instructions (Signed)
Garrett [name]  12/11/2020      [place], [place] - [address] [place] [address] Phone: [phone] Fax: [phone]  [place], [address], [address] [place], [place] [address] Phone: [phone] Fax: [phone]    Your procedure is scheduled on 01/02/21.  Report to [facility] Admitting at 530 A.M.  Call this number if you have problems the morning of surgery:  [phone]   Remember:  Do not eat or drink after midnight.     Take these medicines the morning of surgery with A SIP OF WATER -----ALL INHALERS,LOPRESOR,PRILOSEC    Do not wear jewelry, make-up or nail polish.  Do not wear lotions, powders, or perfumes, or deodorant.  Do not shave 48 hours prior to surgery.  Men may shave face and neck.  Do not bring valuables to the hospital.  [facility] is not responsible for any belongings or valuables.  Contacts, dentures or bridgework may not be worn into surgery.  Leave your suitcase in the car.  After surgery it may be brought to your room.  For patients admitted to the hospital, discharge time will be determined by your treatment team.  Patients discharged the day of surgery will not be allowed to drive home.    Special instructions:  Do not take any aspirin,anti-inflammatories,vitamins,or herbal supplements 5-7 days prior to surgery.[place] - Preparing for Surgery  Before surgery, you can play an important role.  Because skin is not sterile, your skin needs to be as free of germs as possible.  You can reduce the number of germs on you skin by washing with CHG (chlorahexidine gluconate) soap before surgery.  CHG is an antiseptic cleaner which kills germs and bonds with the skin to continue killing germs even after washing.  Oral Hygiene is also important in reducing the risk of infection.  Remember to brush your teeth with your  regular toothpaste the morning of surgery.  Please DO NOT use if you have an allergy to CHG or antibacterial soaps.  If your skin becomes reddened/irritated stop using the CHG and inform your nurse when you arrive at Short Stay.  Do not shave (including legs and underarms) for at least 48 hours prior to the first CHG shower.  You may shave your face.  Please follow these instructions carefully:   1.  Shower with CHG Soap the night before surgery and the morning of Surgery.  2.  If you choose to wash your hair, wash your hair first as usual with your normal shampoo.  3.  After you shampoo, rinse your hair and body thoroughly to remove the shampoo. 4.  Use CHG as you would any other liquid soap.  You can apply chg directly to the skin and wash gently with a      scrungie or washcloth.           5.  Apply the CHG Soap to your body ONLY FROM THE NECK DOWN.   Do not use on open wounds or open sores. Avoid contact with your eyes, ears, mouth and genitals (private parts).  Wash genitals (private parts) with your normal soap.  6.  Wash thoroughly, paying special attention to the area where your surgery will be performed.  7.  Thoroughly rinse your body with warm water from the neck down.  8.  DO NOT shower/wash  with your normal soap after using and rinsing off the CHG Soap.  9.  Pat yourself dry with a clean towel.            10.  Wear clean pajamas.            11.  Place clean sheets on your bed the night of your first shower and do not sleep with pets.  Day of Surgery  Do not apply any lotions/deoderants the morning of surgery.   Please wear clean clothes to the hospital/surgery center. Remember to brush your teeth with toothpaste.    Please read over the following fact sheets that you were give   How to Manage Your Diabetes Before and After Surgery  Why is it important to control my blood sugar before and after surgery? . Improving blood sugar levels before and after surgery helps  healing and can limit problems. . A way of improving blood sugar control is eating a healthy diet by: o  Eating less sugar and carbohydrates o  Increasing activity/exercise o  Talking with your doctor about reaching your blood sugar goals . High blood sugars (greater than 180 mg/dL) can raise your risk of infections and slow your recovery, so you will need to focus on controlling your diabetes during the weeks before surgery. . Make sure that the doctor who takes care of your diabetes knows about your planned surgery including the date and location.  How do I manage my blood sugar before surgery? . Check your blood sugar at least 4 times a day, starting 2 days before surgery, to make sure that the level is not too high or low. o Check your blood sugar the morning of your surgery when you wake up and every 2 hours until you get to the Short Stay unit. . If your blood sugar is less than 70 mg/dL, you will need to treat for low blood sugar: o Do not take insulin. o Treat a low blood sugar (less than 70 mg/dL) with  cup of clear juice (cranberry or apple), 4 glucose tablets, OR glucose gel. Recheck blood sugar in 15 minutes after treatment (to make sure it is greater than 70 mg/dL). If your blood sugar is not greater than 70 mg/dL on recheck, call [phone] o  for further instructions. . Report your blood sugar to the short stay nurse when you get to Short Stay.  . If you are admitted to the hospital after surgery: o Your blood sugar will be checked by the staff and you will probably be given insulin after surgery (instead of oral diabetes medicines) to make sure you have good blood sugar levels. o The goal for blood sugar control after surgery is 80-180 mg/dL.              WHAT DO I DO ABOUT MY DIABETES MEDICATION?   Marland Kitchen Do not take oral diabetes medicines (pills) the morning of surgery.  .       .   . The day of surgery, do not take other diabetes injectables, including  Byetta (exenatide), Bydureon (exenatide ER), Victoza (liraglutide), or Trulicity (dulaglutide).  . If your CBG is greater than 220 mg/dL, you may take  of your sliding scale (correction) dose of insulin.  Other Instructions:          Patient Signature:  Date:   Nurse Signature:  Date:   Reviewed and Endorsed by [facility] Patient Education Committee, August 2015

## 2020-12-11 NOTE — Telephone Encounter (Signed)
Insurance will not pay for that unless they are on 3-4 injections a day

## 2020-12-12 ENCOUNTER — Encounter (HOSPITAL_COMMUNITY)
Admission: RE | Admit: 2020-12-12 | Discharge: 2020-12-12 | Disposition: A | Discharge status: Home / Self Care | Payer: Medicare Other | Source: Ambulatory Visit | Attending: General Surgery | Admitting: General Surgery

## 2020-12-12 ENCOUNTER — Other Ambulatory Visit: Payer: Self-pay

## 2020-12-12 ENCOUNTER — Encounter (HOSPITAL_COMMUNITY): Payer: Self-pay

## 2020-12-12 DIAGNOSIS — I110 Hypertensive heart disease with heart failure: Secondary | ICD-10-CM | POA: Diagnosis not present

## 2020-12-12 DIAGNOSIS — Z6832 Body mass index (BMI) 32.0-32.9, adult: Secondary | ICD-10-CM | POA: Insufficient documentation

## 2020-12-12 DIAGNOSIS — I5032 Chronic diastolic (congestive) heart failure: Secondary | ICD-10-CM | POA: Insufficient documentation

## 2020-12-12 DIAGNOSIS — E119 Type 2 diabetes mellitus without complications: Secondary | ICD-10-CM | POA: Diagnosis not present

## 2020-12-12 DIAGNOSIS — Z79899 Other long term (current) drug therapy: Secondary | ICD-10-CM | POA: Insufficient documentation

## 2020-12-12 DIAGNOSIS — G4733 Obstructive sleep apnea (adult) (pediatric): Secondary | ICD-10-CM | POA: Diagnosis not present

## 2020-12-12 DIAGNOSIS — E669 Obesity, unspecified: Secondary | ICD-10-CM | POA: Insufficient documentation

## 2020-12-12 DIAGNOSIS — Z01812 Encounter for preprocedural laboratory examination: Secondary | ICD-10-CM | POA: Diagnosis not present

## 2020-12-12 DIAGNOSIS — Z7901 Long term (current) use of anticoagulants: Secondary | ICD-10-CM | POA: Insufficient documentation

## 2020-12-12 DIAGNOSIS — Z7984 Long term (current) use of oral hypoglycemic drugs: Secondary | ICD-10-CM | POA: Insufficient documentation

## 2020-12-12 DIAGNOSIS — R011 Cardiac murmur, unspecified: Secondary | ICD-10-CM | POA: Insufficient documentation

## 2020-12-12 DIAGNOSIS — Z87891 Personal history of nicotine dependence: Secondary | ICD-10-CM | POA: Diagnosis not present

## 2020-12-12 DIAGNOSIS — K219 Gastro-esophageal reflux disease without esophagitis: Secondary | ICD-10-CM | POA: Insufficient documentation

## 2020-12-12 DIAGNOSIS — K449 Diaphragmatic hernia without obstruction or gangrene: Secondary | ICD-10-CM | POA: Diagnosis not present

## 2020-12-12 LAB — COMPREHENSIVE METABOLIC PANEL
ALT: 55 U/L — ABNORMAL HIGH (ref 0–44)
AST: 35 U/L (ref 15–41)
Albumin: 3.8 g/dL (ref 3.5–5.0)
Alkaline Phosphatase: 39 U/L (ref 38–126)
Anion gap: 9 (ref 5–15)
BUN: 18 mg/dL (ref 8–23)
CO2: 23 mmol/L (ref 22–32)
Calcium: 9.5 mg/dL (ref 8.9–10.3)
Chloride: 111 mmol/L (ref 98–111)
Creatinine, Ser: 1.39 mg/dL — ABNORMAL HIGH (ref 0.61–1.24)
GFR, Estimated: 55 mL/min — ABNORMAL LOW (ref >60)
Glucose, Bld: 121 mg/dL — ABNORMAL HIGH (ref 70–99)
Potassium: 3.7 mmol/L (ref 3.5–5.1)
Sodium: 143 mmol/L (ref 135–145)
Total Bilirubin: 0.7 mg/dL (ref 0.3–1.2)
Total Protein: 6.7 g/dL (ref 6.5–8.1)

## 2020-12-12 LAB — CBC
HCT: 37.3 % — ABNORMAL LOW (ref 39.0–52.0)
Hemoglobin: 12.2 g/dL — ABNORMAL LOW (ref 13.0–17.0)
MCH: 29.8 pg (ref 26.0–34.0)
MCHC: 32.7 g/dL (ref 30.0–36.0)
MCV: 91 fL (ref 80.0–100.0)
Platelets: 282 10*3/uL (ref 150–400)
RBC: 4.1 MIL/uL — ABNORMAL LOW (ref 4.22–5.81)
RDW: 14.5 % (ref 11.5–15.5)
WBC: 7.8 10*3/uL (ref 4.0–10.5)
nRBC: 0 % (ref 0.0–0.2)

## 2020-12-12 LAB — GLUCOSE, CAPILLARY: Glucose-Capillary: 121 mg/dL — ABNORMAL HIGH (ref 70–99)

## 2020-12-12 NOTE — Pre-Procedure Instructions (Signed)
Woodward Klem Show  12/12/2020      [place], [place] - [address] [place] [address] Phone: [phone] Fax: [phone]  [place], [address], [address] [place], [place] [address] Phone: [phone] Fax: [phone]    Your procedure is scheduled on 01/02/21.  Report to [facility] Admitting at 530 A.M.  Call this number if you have problems the morning of surgery:  [phone]   Remember:  Do not eat or drink after midnight.     Take these medicines the morning of surgery with A SIP OF WATER -----ALL INHALERS,LOPRESOR (metoprolol),PRILOSEC, allopurinal, namenda, topiramate (topamax), verapamil (calan)   if needed: antivert (meclizine), nitroglycerine, imitrex             7 days prior to surgery STOP taking any Aspirin (unless otherwise instructed by your surgeon), Aleve, Naproxen, Ibuprofen, Motrin, Advil, Goody's, BC's, all herbal medications, fish oil, and all vitamins, celebrex                 Follow your surgeon's instructions on when to stop xarelto.  If no instructions were given by your surgeon then you will need to call the office to get those instructions.      Do not wear jewelry.  Do not wear lotions, powders, or perfumes, or deodorant.  Do not shave 48 hours prior to surgery.  Men may shave face and neck.  Do not bring valuables to the hospital.  [facility] is not responsible for any belongings or valuables.  Contacts, dentures or bridgework may not be worn into surgery.  Leave your suitcase in the car.  After surgery it may be brought to your room.  For patients admitted to the hospital, discharge time will be determined by your treatment team.  Patients discharged the day of surgery will not be allowed to drive home.    Special instructions:  Do not take any aspirin,anti-inflammatories,vitamins,or herbal supplements 5-7  days prior to surgery.[place] - Preparing for Surgery  Before surgery, you can play an important role.  Because skin is not sterile, your skin needs to be as free of germs as possible.  You can reduce the number of germs on you skin by washing with CHG (chlorahexidine gluconate) soap before surgery.  CHG is an antiseptic cleaner which kills germs and bonds with the skin to continue killing germs even after washing.  Oral Hygiene is also important in reducing the risk of infection.  Remember to brush your teeth with your regular toothpaste the morning of surgery.  Please DO NOT use if you have an allergy to CHG or antibacterial soaps.  If your skin becomes reddened/irritated stop using the CHG and inform your nurse when you arrive at Short Stay.  Do not shave (including legs and underarms) for at least 48 hours prior to the first CHG shower.  You may shave your face.  Please follow these instructions carefully:   1.  Shower with CHG Soap the night before surgery and the morning of Surgery.  2.  If you choose to wash your hair, wash your hair first as usual with your normal shampoo.  3.  After you shampoo, rinse your hair and body thoroughly to remove the shampoo. 4.  Use CHG as you would any other liquid soap.  You can apply chg directly to the skin and wash gently  with a      scrungie or washcloth.           5.  Apply the CHG Soap to your body ONLY FROM THE NECK DOWN.   Do not use on open wounds or open sores. Avoid contact with your eyes, ears, mouth and genitals (private parts).  Wash genitals (private parts) with your normal soap.  6.  Wash thoroughly, paying special attention to the area where your surgery will be performed.  7.  Thoroughly rinse your body with warm water from the neck down.  8.  DO NOT shower/wash with your normal soap after using and rinsing off the CHG Soap.  9.  Pat yourself dry with a clean towel.            10.  Wear clean pajamas.            11.  Place clean  sheets on your bed the night of your first shower and do not sleep with pets.  Day of Surgery  Do not apply any lotions/deoderants the morning of surgery.   Please wear clean clothes to the hospital/surgery center. Remember to brush your teeth with toothpaste.    Please read over the following fact sheets that you were give   How to Manage Your Diabetes Before and After Surgery  Why is it important to control my blood sugar before and after surgery? . Improving blood sugar levels before and after surgery helps healing and can limit problems. . A way of improving blood sugar control is eating a healthy diet by: o  Eating less sugar and carbohydrates o  Increasing activity/exercise o  Talking with your doctor about reaching your blood sugar goals . High blood sugars (greater than 180 mg/dL) can raise your risk of infections and slow your recovery, so you will need to focus on controlling your diabetes during the weeks before surgery. . Make sure that the doctor who takes care of your diabetes knows about your planned surgery including the date and location.  How do I manage my blood sugar before surgery? . Check your blood sugar at least 4 times a day, starting 2 days before surgery, to make sure that the level is not too high or low. o Check your blood sugar the morning of your surgery when you wake up and every 2 hours until you get to the Short Stay unit. . If your blood sugar is less than 70 mg/dL, you will need to treat for low blood sugar: o Do not take insulin. o Treat a low blood sugar (less than 70 mg/dL) with  cup of clear juice (cranberry or apple), 4 glucose tablets, OR glucose gel. Recheck blood sugar in 15 minutes after treatment (to make sure it is greater than 70 mg/dL). If your blood sugar is not greater than 70 mg/dL on recheck, call [phone] o  for further instructions. . Report your blood sugar to the short stay nurse when you get to Short Stay.  . If you are  admitted to the hospital after surgery: o Your blood sugar will be checked by the staff and you will probably be given insulin after surgery (instead of oral diabetes medicines) to make sure you have good blood sugar levels. o The goal for blood sugar control after surgery is 80-180 mg/dL.       WHAT DO I DO ABOUT MY DIABETES MEDICATION?   Marland Kitchen Do not take oral diabetes medicines (pills) the morning of surgery. (metformin/glucophage)

## 2020-12-12 NOTE — Progress Notes (Signed)
PCP - Kendrick Fries chase @ [facility] Cardiologist - [place] @ [facility]   Chest x-ray - 10/21 EKG - 10/21 Stress Test -  ECHO - 1/20 Cardiac Cath - 1994  Sleep Study - 5 yrs. ago CPAP - yes  Fasting Blood Sugar - 120-130 Checks Blood Sugar ___1__ times a day  Blood Thinner Instructions:  xarelto last dose 12/28/20 Aspirin Instructions: na  ERAS Protcol - na   COVID TEST- 12/29/20   Anesthesia review: medical Hx.(pulm/cardiac)  Patient denies shortness of breath, fever, cough and chest pain at PAT appointment   All instructions explained to the patient, with a verbal understanding of the material. Patient agrees to go over the instructions while at home for a better understanding. Patient also instructed to self quarantine after being tested for COVID-19. The opportunity to ask questions was provided.
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
## 2020-12-14 NOTE — Anesthesia Preprocedure Evaluation (Addendum)
Anesthesia Evaluation  Patient identified by MRN, date of birth, ID band Patient awake    Reviewed: Allergy & Precautions, NPO status , Patient's Chart, lab work & pertinent test results  Airway Mallampati: II  TM Distance: >3 FB Neck ROM: Full    Dental  (+) Upper Dentures, Lower Dentures   Pulmonary shortness of breath and with exertion, asthma , sleep apnea and Continuous Positive Airway Pressure Ventilation , former smoker, PE   Pulmonary exam normal        Cardiovascular hypertension, Pt. on medications and Pt. on home beta blockers +CHF and + DOE   Rhythm:Regular Rate:Normal     Neuro/Psych  Headaches, Dementia    GI/Hepatic Neg liver ROS, hiatal hernia, GERD  Medicated,  Endo/Other  diabetes, Type 2, Oral Hypoglycemic Agents  Renal/GU   negative genitourinary   Musculoskeletal  (+) Arthritis , Osteoarthritis,    Abdominal (+)  Abdomen: soft. Bowel sounds: normal.  Peds  Hematology negative hematology ROS (+)   Anesthesia Other Findings   Reproductive/Obstetrics                           Anesthesia Physical Anesthesia Plan  ASA: III  Anesthesia Plan: General   Post-op Pain Management:    Induction: Intravenous  PONV Risk Score and Plan: 2 and Ondansetron, Dexamethasone and Treatment may vary due to age or medical condition  Airway Management Planned: Mask and Oral ETT  Additional Equipment: None  Intra-op Plan:   Post-operative Plan: Extubation in OR  Informed Consent: I have reviewed the patients History and Physical, chart, labs and discussed the procedure including the risks, benefits and alternatives for the proposed anesthesia with the patient or authorized representative who has indicated his/her understanding and acceptance.     Dental advisory given  Plan Discussed with: CRNA  Anesthesia Plan Comments: (PAT note written by Myra Gianotti, PA-C. Lab  Results      Component                Value               Date                      WBC                      7.8                 12/12/2020                HGB                      12.2 (L)            12/12/2020                HCT                      37.3 (L)            12/12/2020                MCV                      91.0                12/12/2020  PLT                      282                 12/12/2020           Lab Results      Component                Value               Date                      NA                       143                 12/12/2020                K                        3.7                 12/12/2020                CO2                      23                  12/12/2020                GLUCOSE                  121 (H)             12/12/2020                BUN                      18                  12/12/2020                CREATININE               1.39 (H)            12/12/2020                CALCIUM                  9.5                 12/12/2020                GFRNONAA                 55 (L)              12/12/2020                GFRAA                    >60                 09/02/2020            Nuclear stress test 11/09/20 ([facility]):  Impressions: 1.  The gated study shows the ejection fraction was 65 to 70%. 2.  There is reverse  redistribution of the inferior and inferior septal walls which is of unknown clinical significance.  No concerning ischemia on the study.  Echo 09/30/2020 [facility]) SUMMARY  There is normal left ventricular wall thickness.  LV ejection fraction = 65-70%.  Left ventricular systolicfunction is normal.  There is trace tricuspid regurgitation.  There is trivial pericardial effusion.  There is no comparison study available. )      Anesthesia Quick Evaluation

## 2020-12-26 DIAGNOSIS — L570 Actinic keratosis: Secondary | ICD-10-CM | POA: Diagnosis not present

## 2020-12-26 DIAGNOSIS — L821 Other seborrheic keratosis: Secondary | ICD-10-CM | POA: Diagnosis not present

## 2020-12-29 ENCOUNTER — Other Ambulatory Visit (HOSPITAL_COMMUNITY): Payer: Medicare Other

## 2021-01-17 ENCOUNTER — Ambulatory Visit: Payer: Medicare Other | Admitting: Podiatry

## 2021-01-17 ENCOUNTER — Other Ambulatory Visit: Payer: Self-pay

## 2021-01-17 ENCOUNTER — Encounter: Payer: Self-pay | Admitting: Podiatry

## 2021-01-17 ENCOUNTER — Ambulatory Visit (INDEPENDENT_AMBULATORY_CARE_PROVIDER_SITE_OTHER): Payer: Medicare Other

## 2021-01-17 DIAGNOSIS — M79672 Pain in left foot: Secondary | ICD-10-CM

## 2021-01-17 DIAGNOSIS — M84374A Stress fracture, right foot, initial encounter for fracture: Secondary | ICD-10-CM | POA: Diagnosis not present

## 2021-01-21 NOTE — Progress Notes (Signed)
Subjective:   Patient ID: Garrett [name], male   DOB: 70 y.o.   MRN: [id number]   HPI Patient is found to have some discomfort in the dorsal aspect of the left foot with some distal inflammation that appears to not be specifically related to the pain the patient is experiencing   ROS      Objective:  Physical Exam  Knee neurovascular status intact with 2 separate problems with what appears to be distal inflammation occurring left first metatarsal and into the MPJ with redness more proximal which appears to be more related to skin condition     Assessment:  Moderate grade inflammation of the left dorsal foot distal with some irritation proximal which appears to be more related to skin dermatitis     Plan:  H&P reviewed condition recommended oral anti-inflammatories topical steroid cream and ice therapy as needed.  Patient will be seen back to recheck is encouraged to call with questions concerns  X-rays indicate no signs of fracture appears to be soft tissue with mild arthritis around the big toe joint

## 2021-01-26 NOTE — Progress Notes (Deleted)
Chronic Care Management Pharmacy Note  01/26/2021 Name:  Garrett [name] MRN:  [id number] DOB:  1951/06/28  Subjective: Garrett [name] is an 70 y.o. year old male who is a primary patient of Ann Held, DO.  The CCM team was consulted for assistance with disease management and care coordination needs.    Engaged with patient by telephone for follow up visit in response to provider referral for pharmacy case management and/or care coordination services.   Consent to Services:  The patient was given the following information about Chronic Care Management services today, agreed to services, and gave verbal consent: 1. CCM service includes personalized support from designated clinical staff supervised by the primary care provider, including individualized plan of care and coordination with other care providers 2. 24/7 contact phone numbers for assistance for urgent and routine care needs. 3. Service will only be billed when office clinical staff spend 20 minutes or more in a month to coordinate care. 4. Only one practitioner may furnish and bill the service in a calendar month. 5.The patient may stop CCM services at any time (effective at the end of the month) by phone call to the office staff. 6. The patient will be responsible for cost sharing (co-pay) of up to 20% of the service fee (after annual deductible is met). Patient agreed to services and consent obtained.  Patient Care Team: Carollee Herter, Alferd Apa, DO as PCP - General (Family Medicine) Milus Banister, MD (Gastroenterology) [name], MD (Neurosurgery) Calvert Cantor, MD as Consulting Physician (Ophthalmology) Tanda Rockers, MD as Consulting Physician (Pulmonary Disease) Cameron Sprang, MD as Consulting Physician (Neurology) [name], MD as Consulting Physician (Neurosurgery) Marlaine Hind, MD as Consulting Physician (Physical Medicine and Rehabilitation) Day, Melvenia Beam, [facility] as Pharmacist (Pharmacist)  Recent  office visits: 08/17/20: Visit w/ Dr. Etter Sjogren - Flurbirofen for arthritis. Trelegy prescribed?  Recent consult visits: 10/19/20: Pulm visit w/ Dr. Melvyn Novas: Provider discontinued Amoxicillin 875-125 mg and Fluticasone. Patient received Fluad quad High Dose.   09/08/20: Cardio visit w/ Dr. Lamonte Sakai - Acute on chronic diastolic HF: increase furosemide to $RemoveBefor'40mg'eijxqVpIsdAs$  daily. D/C pravastatin and start atorvastatin.  ED Visit:  09/29/20-10/01/20: [facility] - SOB/Chest Pain: ordered ntiroglycerin as needed for patient.  Objective:  Lab Results  Component Value Date   CREATININE 1.39 (H) 12/12/2020   BUN 18 12/12/2020   GFR 58.87 (L) 11/15/2020   GFRNONAA 55 (L) 12/12/2020   GFRAA >60 09/02/2020   NA 143 12/12/2020   K 3.7 12/12/2020   CALCIUM 9.5 12/12/2020   CO2 23 12/12/2020    Lab Results  Component Value Date/Time   HGBA1C 6.7 (H) 11/15/2020 08:09 AM   HGBA1C 6.3 (H) 08/17/2020 09:48 AM   GFR 58.87 (L) 11/15/2020 08:09 AM   GFR 54.56 (L) 05/17/2020 07:56 AM   MICROALBUR 0.2 08/17/2020 09:48 AM   MICROALBUR <0.7 04/09/2019 09:01 AM    Last diabetic Eye exam:  Lab Results  Component Value Date/Time   HMDIABEYEEXA No Retinopathy 12/21/2018 12:34 PM    Last diabetic Foot exam: No results found for: HMDIABFOOTEX   Lab Results  Component Value Date   CHOL 148 11/15/2020   HDL 42.50 11/15/2020   LDLCALC 74 11/15/2020   LDLDIRECT 104.0 02/14/2020   TRIG 160.0 (H) 11/15/2020   CHOLHDL 3 11/15/2020    Hepatic Function Latest Ref Rng & Units 12/12/2020 11/15/2020 08/17/2020  Total Protein 6.5 - 8.1 g/dL 6.7 6.5 6.6  Albumin 3.5 - 5.0  g/dL 3.8 4.1 -  AST 15 - 41 U/L 35 29 34  ALT 0 - 44 U/L 55(H) 51 55(H)  Alk Phosphatase 38 - 126 U/L 39 41 -  Total Bilirubin 0.3 - 1.2 mg/dL 0.7 0.4 0.5  Bilirubin, Direct 0.0 - 0.3 mg/dL - - -    Lab Results  Component Value Date/Time   TSH 1.30 04/28/2018 10:16 AM   TSH 1.24 02/03/2017 10:50 AM    CBC Latest Ref Rng & Units 12/12/2020  09/02/2020 01/06/2019  WBC 4.0 - 10.5 K/uL 7.8 13.7(H) 8.6  Hemoglobin 13.0 - 17.0 g/dL 12.2(L) 13.2 13.7  Hematocrit 39.0 - 52.0 % 37.3(L) 41.6 41.6  Platelets 150 - 400 K/uL 282 290 237.0    No results found for: VD25OH  Clinical ASCVD: Yes  The 10-year ASCVD risk score Mikey Bussing DC Jr., et al., 2013) is: 42.8%   Values used to calculate the score:     Age: 89 years     Sex: Male     Is Non-Hispanic African American: No     Diabetic: Yes     Tobacco smoker: No     Systolic Blood Pressure: 031 mmHg     Is BP treated: Yes     HDL Cholesterol: 42.5 mg/dL     Total Cholesterol: 148 mg/dL    Depression screen [facility] 2/9 02/03/2020 02/10/2018  Decreased Interest 0 0  Down, Depressed, Hopeless 0 0  PHQ - 2 Score 0 0  Some recent data might be hidden     Social History   Tobacco Use  Smoking Status Former Smoker  . Packs/day: 3.00  . Years: 30.00  . Pack years: 90.00  . Types: Cigarettes  . Quit date: 01/09/1991  . Years since quitting: 30.0  Smokeless Tobacco Current User  . Types: Snuff   BP Readings from Last 3 Encounters:  12/12/20 (!) 151/88  10/19/20 (!) 150/82  09/29/20 138/83   Pulse Readings from Last 3 Encounters:  12/12/20 73  10/19/20 85  09/29/20 68   Wt Readings from Last 3 Encounters:  12/12/20 223 lb 6.4 oz (101.3 kg)  10/19/20 223 lb 9.6 oz (101.4 kg)  09/29/20 219 lb (99.3 kg)    Assessment/Interventions: Review of patient past medical history, allergies, medications, health status, including review of consultants reports, laboratory and other test data, was performed as part of comprehensive evaluation and provision of chronic care management services.   SDOH:  (Social Determinants of Health) assessments and interventions performed:   CCM Care Plan  Allergies  Allergen Reactions  . Bee Venom Anaphylaxis  . Garlic Swelling    Medications Reviewed Today    Reviewed by Wallene Huh, DPM (Physician) on 01/17/21 at 1609  Med List Status: <None>   Medication Order Taking? Sig Documenting Provider Last Dose Status Informant  albuterol (PROVENTIL) (2.5 MG/3ML) 0.083% nebulizer solution [id number] No Take 3 mLs (2.5 mg total) by nebulization every 6 (six) hours as needed for wheezing or shortness of breath. Ann Held, DO Taking Active Self  allopurinol (ZYLOPRIM) 100 MG tablet [id number] No TAKE 1 TABLET BY MOUTH  DAILY  Patient taking differently: Take 100 mg by mouth daily.   Ann Held, DO Taking Active Self  atorvastatin (LIPITOR) 40 MG tablet [id number] No Take 40 mg by mouth every evening. [provider] Taking Active Self  budesonide-formoterol (SYMBICORT) 80-4.5 MCG/ACT inhaler [id number] No Inhale 2 puffs into the lungs daily. [provider] Taking Active Self  celecoxib (CELEBREX) 200 MG capsule [id number] No TAKE 1 CAPSULE BY MOUTH  TWICE DAILY  Patient taking differently: Take 400 mg by mouth daily.   Ann Held, DO Taking Active Self  EPINEPHrine 0.3 mg/0.3 mL IJ SOAJ injection [id number] No Inject 0.3 mg into the muscle as needed for anaphylaxis.  [provider] Taking Active Self  famotidine (PEPCID) 20 MG tablet [id number] No Take 1 tablet (20 mg total) by mouth at bedtime. Milus Banister, MD Taking Active Self  fenofibrate 160 MG tablet [id number] No TAKE 1 TABLET BY MOUTH  DAILY  Patient taking differently: Take 160 mg by mouth at bedtime.   Ann Held, DO Taking Active Self  fluticasone (FLONASE) 50 MCG/ACT nasal spray [id number] No Place 2 sprays into both nostrils daily.  Patient taking differently: Place 2 sprays into both nostrils daily as needed for allergies.   [name] R, DO Taking Active Self  furosemide (LASIX) 20 MG tablet [id number] No TAKE 1 TABLET BY MOUTH  DAILY  Patient not taking: No sig reported   Ann Held, DO Not Taking Unknown time Active Self           Med Note Jilda Roche A   Thu Dec 07, 2020 10:53 AM)     furosemide (LASIX) 40 MG tablet [id number]  Take 40 mg by mouth daily. [provider]  Active Self  gabapentin (NEURONTIN) 100 MG capsule [id number] No TAKE 2 CAPSULES BY MOUTH AT BEDTIME  Patient taking differently: Take 200 mg by mouth at bedtime.   Lyndal Pulley, DO Taking Active Self  glucose blood (ONE TOUCH ULTRA TEST) test strip [id number] No USE AS DIRECTED THREE TIMES DAILY.  DX CODE E11.21 Ann Held, DO Taking Active Self  levocetirizine (XYZAL) 5 MG tablet [id number]  Take 1 tablet (5 mg total) by mouth every evening. Ann Held, DO  Active Self  magnesium oxide (MAG-OX) 400 (241.3 Mg) MG tablet [id number] No Take 1 tablet (400 mg total) by mouth daily. Reyne Dumas, MD Taking Active Self  meclizine (ANTIVERT) 25 MG tablet [id number] No Take 1 tablet (25 mg total) by mouth 3 (three) times daily as needed for dizziness.  Patient taking differently: Take 25 mg by mouth as needed for dizziness.   Ann Held, DO Taking Active Self  memantine (NAMENDA) 10 MG tablet [id number]  TAKE 2 TABLETS BY MOUTH  DAILY  Patient taking differently: Take 20 mg by mouth daily.   Ann Held, DO  Active Self  metFORMIN (GLUCOPHAGE) 500 MG tablet [id number] No TAKE 1 TABLET BY MOUTH  TWICE DAILY WITH A MEAL  Patient taking differently: Take 500 mg by mouth 2 (two) times daily.   Ann Held, DO Taking Active Self  metoprolol tartrate (LOPRESSOR) 25 MG tablet [id number] No TAKE 1 TABLET BY MOUTH  TWICE DAILY  Patient taking differently: Take 25 mg by mouth 2 (two) times daily.   Ann Held, DO Taking Active Self  Multiple Vitamins-Minerals (ONE-A-DAY WEIGHT SMART ADVANCE PO) [id number] No Take 1 tablet by mouth daily. Centrum Silver [provider] Taking Active Self  nitroGLYCERIN (NITROSTAT) 0.4 MG SL tablet [id number]  Place 0.4 mg under the tongue every 5 (five) minutes as needed for chest pain. [provider]  Active  Self  omeprazole (PRILOSEC) 40 MG capsule [id number] No Take 1 capsule shortly before breakfast and 1 capsule shortly before dinner meal.  Patient taking differently: Take 40 mg by mouth 2 (two) times daily.   Milus Banister, MD Taking Active Self  potassium chloride SA (KLOR-CON) 20 MEQ tablet [id number] No TAKE 2 TABLETS BY MOUTH  DAILY  Patient taking differently: Take 40 mEq by mouth 2 (two) times daily.   Ann Held, DO Taking Active Self  PROAIR HFA 108 [id number] Base) MCG/ACT inhaler [id number] No Inhale 1 puff into the lungs every 6 (six) hours as needed for wheezing or shortness of breath.  Patient taking differently: Inhale 1-2 puffs into the lungs every 6 (six) hours as needed for wheezing or shortness of breath.   Ann Held, DO Taking Active Self  SUMAtriptan (IMITREX) 50 MG tablet [id number] No Take 1 tablet as needed for migraine/vertigo. Do not take more than 3 a week  Patient taking differently: Take 50 mg by mouth every 2 (two) hours as needed for migraine. Max 3 tabs per week   Carollee Herter, Alferd Apa, DO Taking Active Self  topiramate (TOPAMAX) 50 MG tablet [id number] No Take 1 tablet (50 mg total) by mouth 2 (two) times daily. Cameron Sprang, MD Taking Active Self  verapamil (CALAN-SR) 240 MG CR tablet [id number]  TAKE 1 TABLET BY MOUTH AT  BEDTIME  Patient taking differently: Take 240 mg by mouth at bedtime.   Ann Held, DO  Active Self  vitamin B-12 (CYANOCOBALAMIN) 1000 MCG tablet [id number]  Take 1,000 mcg by mouth daily. [provider]  Active Self  XARELTO 20 MG TABS tablet [id number] No TAKE 1 TABLET BY MOUTH  DAILY WITH SUPPER  Patient taking differently: Take 20 mg by mouth daily.   Tanda Rockers, MD Taking Active Self          Patient Active Problem List   Diagnosis Date Noted  . Laceration of right hand 08/02/2019  . Acute pulmonary embolism ([place]) 01/07/2019  . Chronic diastolic CHF (congestive heart failure) ([place])  01/07/2019  . Preventative health care 02/10/2018  . Dizziness 02/10/2018  . Spondylolisthesis at L3-L4 level 10/28/2017  . Cough variant asthma  vs uacs/ pseudoasthma 06/17/2017  . Pulmonary embolism and infarction ([place]) 02/09/2017  . Bilateral pulmonary embolism ([place]) 02/04/2017  . Greater trochanteric bursitis of left hip 01/14/2017  . Greater trochanteric bursitis of right hip 12/20/2016  . Degenerative arthritis of knee, bilateral 10/08/2016  . Upper airway cough syndrome 03/22/2016  . Multiple pulmonary nodules 03/22/2016  . Acute bronchitis 01/22/2016  . Acute upper respiratory infection 10/25/2015  . Elevated CK 07/18/2015  . History of colonic polyps 04/11/2015  . Lumbar stenosis with neurogenic claudication 11/14/2014  . Spondylolysis of cervical region 02/25/2014  . OSA (obstructive sleep apnea) 12/08/2013  . DOE (dyspnea on exertion) 11/04/2013  . Morbid obesity due to excess calories ([place])   . Cervicalgia   . Elevated PSA, less than 10 ng/ml 03/23/2013  . Venous insufficiency of leg 02/18/2012  . Itching 02/18/2012  . Mild dementia ([place]) 05/28/2011  . Hyperlipidemia associated with type 2 diabetes mellitus ([place]) 05/27/2011  . Controlled type 2 diabetes mellitus with diabetic nephropathy ([place]) 04/10/2011  . Gout 04/10/2011  . Essential hypertension 04/10/2011  . OA (osteoarthritis) 04/10/2011  . GERD (gastroesophageal reflux disease) 04/10/2011  . Migraine headache without aura 04/10/2011  . Allergic rhinitis, cause unspecified 04/10/2011  . Bee sting allergy 04/10/2011    Immunization History  Administered Date(s) Administered  . Fluad Quad(high Dose 65+) 10/07/2019, 10/19/2020  . Influenza Split  09/30/2012  . Influenza, High Dose Seasonal PF 09/19/2016, 10/07/2017, 10/16/2018  . Influenza,inj,Quad PF,6+ Mos 09/15/2013, 09/26/2014, 09/22/2015  . PFIZER(Purple Top)SARS-COV-2 Vaccination 02/12/2020, 03/07/2020, 08/19/2020  . Pneumococcal Conjugate-13 07/18/2015   . Pneumococcal Polysaccharide-23 04/10/2011, 09/19/2016  . Tdap 04/10/2011, 04/09/2019, 07/22/2019  . Zoster 03/28/2014    Conditions to be addressed/monitored:  CHF, HTN, HLD, DMII, Dementia and Asthma, GERD, Allergies, Gout, Pain.  There are no care plans that you recently modified to display for this patient.    Medication Assistance: {MEDASSISTANCEINFO:25044}  Patient's preferred pharmacy is:  [place], [place] - [address] [place] [address] Phone: [phone] Fax: [phone]  [place], [address], [address] [place], [address] [place] [address] Phone: [phone] Fax: [phone]  Uses pill box? Yes Pt endorses ***% compliance  We discussed: {Pharmacy options:24294} Patient decided to: {US Pharmacy DGLO:75643}  Follow Up:  {FOLLOWUP:24991}  Plan: {CM FOLLOW UP PLAN:25073}  ***  Current Barriers:  . {pharmacybarriers:24917} . ***  Pharmacist Clinical Goal(s):  Marland Kitchen Over the next *** days, patient will {PHARMACYGOALCHOICES:24921} through collaboration with PharmD and provider.  . ***  Interventions: . 1:1 collaboration with Carollee Herter, Alferd Apa, DO regarding development and update of comprehensive plan of care as evidenced by provider attestation and co-signature . Inter-disciplinary care team collaboration (see longitudinal plan of care) . Comprehensive medication review performed; medication list updated in electronic medical record  Hypertension (BP goal <140/90) -controlled -Current treatment:  Verapamil 240mg  daily HS  Metoprolol tartrate 25mg  BID -Medications previously tried: ramipril (concern for cough), terazosin (stopped due to low BP) -Current home readings: *** -Current dietary habits: *** -Current exercise habits: *** -{ACTIONS;DENIES/REPORTS:21021675::"Denies"} hypotensive/hypertensive symptoms -Educated on {CCM BP  Counseling:25124} -Counseled to monitor BP at home ***, document, and provide log at future appointments -{CCMPHARMDINTERVENTION:25122}  Hyperlipidemia: (LDL goal < 100) -{CHL Controlled/Uncontrolled:[phone]} -Current treatment:  Atorvastatin 40mg  daily  Fenofibrate 160mg  daily -Medications previously tried: ***  -Current dietary patterns: *** -Current exercise habits: *** -Educated on {CCM HLD Counseling:25126} -{CCMPHARMDINTERVENTION:25122}  Diabetes (A1c goal <7%) w/ neuropathy -controlled -Current medications:  Metformin 500mg  BID  Gabapentin 100mg  daily HS -Medications previously tried: ***  -Current home glucose readings . fasting glucose: *** . post prandial glucose: *** -{ACTIONS;DENIES/REPORTS:21021675::"Denies"} hypoglycemic/hyperglycemic symptoms -Current meal patterns:  . breakfast: ***  . lunch: ***  . dinner: *** . snacks: *** . drinks: *** -Current exercise: *** -Educated on{CCM DM COUNSELING:25123} -Counseled to check feet daily and get yearly eye exams -{CCMPHARMDINTERVENTION:25122}  Heart Failure (Goal: control symptoms and prevent exacerbations) {CHL PIRJJOACZY/SAYTKZSWFUXN:[id number]} Type: Diastolic -NYHA Class: II (slight limitation of activity) -Ejection fraction: 65-70% (Date: 03/03/17) -Current treatment:  Furosemide 40mg  daily (increased from 20mg  at visit with Dr. Lamonte Sakai on 09/08/20)  Potassium chloride 59mEq BID -Medications previously tried: None noted -Current home BP/HR readings: *** -Current dietary habits: *** -Current exercise routine: *** -Educated on {CCM HF Counseling:25125} -{CCMPHARMDINTERVENTION:25122}  Asthma (Goal: Manage symptoms) -controlled -Current treatment   Symbicort 80-4.5mg  2 puffs once daily (prescribed twice daily. Will only use twice daily during allergy season)  Proair PRN  Albuterol nebs (when he gets bronchitis and will use for a day or two) -Medications previously tried: none  noted -{CCMPHARMDINTERVENTION:25122}  GERD (Goal: ***) -{CHL Controlled/Uncontrolled:[phone]} -Current treatment  . Omeprazole 40mg  twice daily -Medications previously tried: ***  -{CCMPHARMDINTERVENTION:25122}  Allergies (Goal: ***) -{CHL Controlled/Uncontrolled:[phone]} -Current treatment  . *** -Medications previously tried: ***  -{CCMPHARMDINTERVENTION:25122}  Gout (Goal: ***) -{CHL Controlled/Uncontrolled:[phone]} -Current treatment  . Allopurinol 173m daily -Medications previously tried: ***  -{CCMPHARMDINTERVENTION:25122}  Pain(Goal: ***) -{CHL Controlled/Uncontrolled:[phone]} -Current treatment  . Celecoxib 203m#2 daily -Medications previously tried: flurbiprofen (did not help) -{CCMPHARMDINTERVENTION:25122}   Patient Goals/Self-Care Activities . Over the next *** days, patient will:  - {pharmacypatientgoals:24919}  Follow Up Plan: {CM FOLLOW UP PLBAQV:67209}

## 2021-01-29 ENCOUNTER — Telehealth: Payer: Medicare Other

## 2021-01-30 ENCOUNTER — Other Ambulatory Visit: Payer: Self-pay | Admitting: Family Medicine

## 2021-01-30 ENCOUNTER — Other Ambulatory Visit: Payer: Self-pay | Admitting: Internal Medicine

## 2021-01-30 DIAGNOSIS — I10 Essential (primary) hypertension: Secondary | ICD-10-CM

## 2021-01-30 NOTE — Telephone Encounter (Signed)
Will need ov with me re longterm rx before next refill

## 2021-01-30 NOTE — Telephone Encounter (Signed)
Dr. Melvyn Novas, please advise if you are okay with [place] refilling med.

## 2021-02-17 ENCOUNTER — Other Ambulatory Visit (HOSPITAL_COMMUNITY)
Admission: RE | Admit: 2021-02-17 | Discharge: 2021-02-17 | Disposition: A | Discharge status: Home / Self Care | Payer: Medicare Other | Source: Ambulatory Visit | Attending: General Surgery | Admitting: General Surgery

## 2021-02-17 DIAGNOSIS — Z20822 Contact with and (suspected) exposure to covid-19: Secondary | ICD-10-CM | POA: Insufficient documentation

## 2021-02-17 DIAGNOSIS — I110 Hypertensive heart disease with heart failure: Secondary | ICD-10-CM | POA: Diagnosis not present

## 2021-02-17 DIAGNOSIS — Z7901 Long term (current) use of anticoagulants: Secondary | ICD-10-CM | POA: Diagnosis not present

## 2021-02-17 DIAGNOSIS — J189 Pneumonia, unspecified organism: Secondary | ICD-10-CM | POA: Diagnosis not present

## 2021-02-17 DIAGNOSIS — R1310 Dysphagia, unspecified: Secondary | ICD-10-CM | POA: Diagnosis not present

## 2021-02-17 DIAGNOSIS — Z833 Family history of diabetes mellitus: Secondary | ICD-10-CM | POA: Diagnosis not present

## 2021-02-17 DIAGNOSIS — Z86711 Personal history of pulmonary embolism: Secondary | ICD-10-CM | POA: Diagnosis not present

## 2021-02-17 DIAGNOSIS — J690 Pneumonitis due to inhalation of food and vomit: Secondary | ICD-10-CM | POA: Diagnosis not present

## 2021-02-17 DIAGNOSIS — K449 Diaphragmatic hernia without obstruction or gangrene: Secondary | ICD-10-CM | POA: Diagnosis not present

## 2021-02-17 DIAGNOSIS — Z48815 Encounter for surgical aftercare following surgery on the digestive system: Secondary | ICD-10-CM | POA: Diagnosis not present

## 2021-02-17 DIAGNOSIS — R110 Nausea: Secondary | ICD-10-CM | POA: Diagnosis not present

## 2021-02-17 DIAGNOSIS — Z9889 Other specified postprocedural states: Secondary | ICD-10-CM | POA: Diagnosis not present

## 2021-02-17 DIAGNOSIS — Z6832 Body mass index (BMI) 32.0-32.9, adult: Secondary | ICD-10-CM | POA: Diagnosis not present

## 2021-02-17 DIAGNOSIS — E119 Type 2 diabetes mellitus without complications: Secondary | ICD-10-CM | POA: Diagnosis not present

## 2021-02-17 DIAGNOSIS — J45909 Unspecified asthma, uncomplicated: Secondary | ICD-10-CM | POA: Diagnosis not present

## 2021-02-17 DIAGNOSIS — E785 Hyperlipidemia, unspecified: Secondary | ICD-10-CM | POA: Diagnosis not present

## 2021-02-17 DIAGNOSIS — Z823 Family history of stroke: Secondary | ICD-10-CM | POA: Diagnosis not present

## 2021-02-17 DIAGNOSIS — K660 Peritoneal adhesions (postprocedural) (postinfection): Secondary | ICD-10-CM | POA: Diagnosis not present

## 2021-02-17 DIAGNOSIS — I5032 Chronic diastolic (congestive) heart failure: Secondary | ICD-10-CM | POA: Diagnosis not present

## 2021-02-17 DIAGNOSIS — G4733 Obstructive sleep apnea (adult) (pediatric): Secondary | ICD-10-CM | POA: Diagnosis not present

## 2021-02-17 DIAGNOSIS — J90 Pleural effusion, not elsewhere classified: Secondary | ICD-10-CM | POA: Diagnosis not present

## 2021-02-17 DIAGNOSIS — E876 Hypokalemia: Secondary | ICD-10-CM | POA: Diagnosis not present

## 2021-02-17 DIAGNOSIS — R0602 Shortness of breath: Secondary | ICD-10-CM | POA: Diagnosis not present

## 2021-02-17 DIAGNOSIS — K7689 Other specified diseases of liver: Secondary | ICD-10-CM | POA: Diagnosis not present

## 2021-02-17 DIAGNOSIS — J9601 Acute respiratory failure with hypoxia: Secondary | ICD-10-CM | POA: Diagnosis not present

## 2021-02-17 DIAGNOSIS — Z8249 Family history of ischemic heart disease and other diseases of the circulatory system: Secondary | ICD-10-CM | POA: Diagnosis not present

## 2021-02-17 DIAGNOSIS — R0603 Acute respiratory distress: Secondary | ICD-10-CM | POA: Diagnosis not present

## 2021-02-17 DIAGNOSIS — Z01812 Encounter for preprocedural laboratory examination: Secondary | ICD-10-CM | POA: Insufficient documentation

## 2021-02-17 DIAGNOSIS — R918 Other nonspecific abnormal finding of lung field: Secondary | ICD-10-CM | POA: Diagnosis not present

## 2021-02-17 DIAGNOSIS — K219 Gastro-esophageal reflux disease without esophagitis: Secondary | ICD-10-CM | POA: Diagnosis not present

## 2021-02-17 DIAGNOSIS — I5033 Acute on chronic diastolic (congestive) heart failure: Secondary | ICD-10-CM | POA: Diagnosis not present

## 2021-02-17 DIAGNOSIS — F17290 Nicotine dependence, other tobacco product, uncomplicated: Secondary | ICD-10-CM | POA: Diagnosis not present

## 2021-02-17 DIAGNOSIS — E872 Acidosis: Secondary | ICD-10-CM | POA: Diagnosis not present

## 2021-02-17 LAB — SARS CORONAVIRUS 2 (TAT 6-24 HRS): SARS Coronavirus 2: NEGATIVE

## 2021-02-19 ENCOUNTER — Encounter (HOSPITAL_COMMUNITY): Payer: Self-pay | Admitting: General Surgery

## 2021-02-19 NOTE — Progress Notes (Signed)
Spoke with pt for pre-op call. Pt was seen for a PAT appt on 12/12/20 but surgery was cancelled. Pt states nothing has changed with his medications, allergies, medical and surgical history. His last dose of Xarelto was 02/16/21. Pt states he has the written instructions that were given him at the PAT appt and will follow those.   Pt states his fasting blood sugar has been between 140-150.   Covid test done 02/17/21 and it's negative. Pt states he's been in quarantine since the test was done and understands that he stays in quarantine until he comes to the hospital tomorrow.

## 2021-02-20 ENCOUNTER — Inpatient Hospital Stay (HOSPITAL_COMMUNITY): Payer: Medicare Other | Admitting: Anesthesiology

## 2021-02-20 ENCOUNTER — Encounter (HOSPITAL_COMMUNITY): Payer: Self-pay | Admitting: General Surgery

## 2021-02-20 ENCOUNTER — Encounter (HOSPITAL_COMMUNITY)
Admission: RE | Disposition: A | Discharge status: Home Health | Payer: Self-pay | Source: Home / Self Care | Attending: General Surgery

## 2021-02-20 ENCOUNTER — Inpatient Hospital Stay (HOSPITAL_COMMUNITY)
Admission: RE | Admit: 2021-02-20 | Discharge: 2021-02-27 | DRG: 326 | Disposition: A | Discharge status: Home Health | Payer: Medicare Other | Attending: General Surgery | Admitting: General Surgery

## 2021-02-20 ENCOUNTER — Other Ambulatory Visit: Payer: Self-pay

## 2021-02-20 DIAGNOSIS — J189 Pneumonia, unspecified organism: Secondary | ICD-10-CM | POA: Diagnosis not present

## 2021-02-20 DIAGNOSIS — E876 Hypokalemia: Secondary | ICD-10-CM | POA: Diagnosis not present

## 2021-02-20 DIAGNOSIS — Z6832 Body mass index (BMI) 32.0-32.9, adult: Secondary | ICD-10-CM

## 2021-02-20 DIAGNOSIS — J690 Pneumonitis due to inhalation of food and vomit: Secondary | ICD-10-CM | POA: Diagnosis not present

## 2021-02-20 DIAGNOSIS — Z823 Family history of stroke: Secondary | ICD-10-CM | POA: Diagnosis not present

## 2021-02-20 DIAGNOSIS — Z7901 Long term (current) use of anticoagulants: Secondary | ICD-10-CM | POA: Diagnosis not present

## 2021-02-20 DIAGNOSIS — K449 Diaphragmatic hernia without obstruction or gangrene: Principal | ICD-10-CM | POA: Diagnosis present

## 2021-02-20 DIAGNOSIS — I110 Hypertensive heart disease with heart failure: Secondary | ICD-10-CM | POA: Diagnosis present

## 2021-02-20 DIAGNOSIS — E119 Type 2 diabetes mellitus without complications: Secondary | ICD-10-CM | POA: Diagnosis present

## 2021-02-20 DIAGNOSIS — Z9889 Other specified postprocedural states: Secondary | ICD-10-CM | POA: Diagnosis present

## 2021-02-20 DIAGNOSIS — K219 Gastro-esophageal reflux disease without esophagitis: Secondary | ICD-10-CM | POA: Diagnosis present

## 2021-02-20 DIAGNOSIS — J9601 Acute respiratory failure with hypoxia: Secondary | ICD-10-CM | POA: Diagnosis not present

## 2021-02-20 DIAGNOSIS — F17290 Nicotine dependence, other tobacco product, uncomplicated: Secondary | ICD-10-CM | POA: Diagnosis present

## 2021-02-20 DIAGNOSIS — E872 Acidosis: Secondary | ICD-10-CM | POA: Diagnosis not present

## 2021-02-20 DIAGNOSIS — R7401 Elevation of levels of liver transaminase levels: Secondary | ICD-10-CM

## 2021-02-20 DIAGNOSIS — Z833 Family history of diabetes mellitus: Secondary | ICD-10-CM

## 2021-02-20 DIAGNOSIS — Z86711 Personal history of pulmonary embolism: Secondary | ICD-10-CM

## 2021-02-20 DIAGNOSIS — R0602 Shortness of breath: Secondary | ICD-10-CM

## 2021-02-20 DIAGNOSIS — K660 Peritoneal adhesions (postprocedural) (postinfection): Secondary | ICD-10-CM | POA: Diagnosis present

## 2021-02-20 DIAGNOSIS — Z20822 Contact with and (suspected) exposure to covid-19: Secondary | ICD-10-CM | POA: Diagnosis present

## 2021-02-20 DIAGNOSIS — J45909 Unspecified asthma, uncomplicated: Secondary | ICD-10-CM | POA: Diagnosis present

## 2021-02-20 DIAGNOSIS — R1310 Dysphagia, unspecified: Secondary | ICD-10-CM | POA: Diagnosis present

## 2021-02-20 DIAGNOSIS — I5033 Acute on chronic diastolic (congestive) heart failure: Secondary | ICD-10-CM | POA: Diagnosis not present

## 2021-02-20 DIAGNOSIS — Z8249 Family history of ischemic heart disease and other diseases of the circulatory system: Secondary | ICD-10-CM

## 2021-02-20 DIAGNOSIS — G4733 Obstructive sleep apnea (adult) (pediatric): Secondary | ICD-10-CM | POA: Diagnosis present

## 2021-02-20 DIAGNOSIS — R0603 Acute respiratory distress: Secondary | ICD-10-CM

## 2021-02-20 DIAGNOSIS — R110 Nausea: Secondary | ICD-10-CM

## 2021-02-20 HISTORY — PX: XI ROBOTIC ASSISTED HIATAL HERNIA REPAIR: SHX6889

## 2021-02-20 HISTORY — PX: INSERTION OF MESH: SHX5868

## 2021-02-20 HISTORY — PX: LAPAROSCOPIC LYSIS OF ADHESIONS: SHX5905

## 2021-02-20 LAB — CBC
HCT: 38.4 % — ABNORMAL LOW (ref 39.0–52.0)
Hemoglobin: 12 g/dL — ABNORMAL LOW (ref 13.0–17.0)
MCH: 28.4 pg (ref 26.0–34.0)
MCHC: 31.3 g/dL (ref 30.0–36.0)
MCV: 91 fL (ref 80.0–100.0)
Platelets: 292 10*3/uL (ref 150–400)
RBC: 4.22 MIL/uL (ref 4.22–5.81)
RDW: 14.5 % (ref 11.5–15.5)
WBC: 8.2 10*3/uL (ref 4.0–10.5)
nRBC: 0 % (ref 0.0–0.2)

## 2021-02-20 LAB — COMPREHENSIVE METABOLIC PANEL
ALT: 49 U/L — ABNORMAL HIGH (ref 0–44)
AST: 29 U/L (ref 15–41)
Albumin: 3.6 g/dL (ref 3.5–5.0)
Alkaline Phosphatase: 41 U/L (ref 38–126)
Anion gap: 12 (ref 5–15)
BUN: 18 mg/dL (ref 8–23)
CO2: 22 mmol/L (ref 22–32)
Calcium: 9.9 mg/dL (ref 8.9–10.3)
Chloride: 108 mmol/L (ref 98–111)
Creatinine, Ser: 1.4 mg/dL — ABNORMAL HIGH (ref 0.61–1.24)
GFR, Estimated: 54 mL/min — ABNORMAL LOW (ref >60)
Glucose, Bld: 150 mg/dL — ABNORMAL HIGH (ref 70–99)
Potassium: 3.4 mmol/L — ABNORMAL LOW (ref 3.5–5.1)
Sodium: 142 mmol/L (ref 135–145)
Total Bilirubin: 0.6 mg/dL (ref 0.3–1.2)
Total Protein: 6.7 g/dL (ref 6.5–8.1)

## 2021-02-20 LAB — GLUCOSE, CAPILLARY
Glucose-Capillary: 140 mg/dL — ABNORMAL HIGH (ref 70–99)
Glucose-Capillary: 201 mg/dL — ABNORMAL HIGH (ref 70–99)
Glucose-Capillary: 160 mg/dL — ABNORMAL HIGH (ref 70–99)

## 2021-02-20 SURGERY — REPAIR, HERNIA, HIATAL, ROBOT-ASSISTED
Anesthesia: General | Site: Abdomen

## 2021-02-20 MED ORDER — BUPIVACAINE-EPINEPHRINE 0.25% -1:200000 IJ SOLN
INTRAMUSCULAR | Status: DC | PRN
  Administered 2021-02-20: 9 mL

## 2021-02-20 MED ORDER — METOPROLOL TARTRATE 25 MG PO TABS
25.0000 mg | ORAL_TABLET | Freq: Two times a day (BID) | ORAL | Status: DC
  Administered 2021-02-20 – 2021-02-27 (×14): 25 mg via ORAL
  Filled 2021-02-20 (×14): qty 1

## 2021-02-20 MED ORDER — HYDROMORPHONE HCL 1 MG/ML IJ SOLN
0.2500 mg | INTRAMUSCULAR | Status: DC | PRN
  Administered 2021-02-20: 0.5 mg via INTRAVENOUS
  Administered 2021-02-20 (×4): 0.25 mg via INTRAVENOUS

## 2021-02-20 MED ORDER — 0.9 % SODIUM CHLORIDE (POUR BTL) OPTIME
TOPICAL | Status: DC | PRN
  Administered 2021-02-20: 1000 mL

## 2021-02-20 MED ORDER — SUCCINYLCHOLINE CHLORIDE 200 MG/10ML IV SOSY
PREFILLED_SYRINGE | INTRAVENOUS | Status: DC | PRN
  Administered 2021-02-20: 120 mg via INTRAVENOUS

## 2021-02-20 MED ORDER — MAGNESIUM OXIDE 400 (241.3 MG) MG PO TABS
400.0000 mg | ORAL_TABLET | Freq: Every day | ORAL | Status: DC
Start: 2021-02-20 — End: 2021-02-27
  Administered 2021-02-21 – 2021-02-27 (×7): 400 mg via ORAL
  Filled 2021-02-20 (×7): qty 1

## 2021-02-20 MED ORDER — ONDANSETRON HCL 4 MG/2ML IJ SOLN
4.0000 mg | Freq: Four times a day (QID) | INTRAMUSCULAR | Status: DC | PRN
  Administered 2021-02-21 – 2021-02-26 (×2): 4 mg via INTRAVENOUS
  Filled 2021-02-20 (×2): qty 2

## 2021-02-20 MED ORDER — ROCURONIUM BROMIDE 10 MG/ML (PF) SYRINGE
PREFILLED_SYRINGE | INTRAVENOUS | Status: AC
  Filled 2021-02-20: qty 10

## 2021-02-20 MED ORDER — ONDANSETRON HCL 4 MG/2ML IJ SOLN
INTRAMUSCULAR | Status: DC | PRN
  Administered 2021-02-20: 4 mg via INTRAVENOUS

## 2021-02-20 MED ORDER — ATORVASTATIN CALCIUM 40 MG PO TABS
40.0000 mg | ORAL_TABLET | Freq: Every evening | ORAL | Status: DC
  Administered 2021-02-21 – 2021-02-24 (×4): 40 mg via ORAL
  Filled 2021-02-20 (×5): qty 1

## 2021-02-20 MED ORDER — FAMOTIDINE 20 MG PO TABS
20.0000 mg | ORAL_TABLET | Freq: Every day | ORAL | Status: DC
  Administered 2021-02-20 – 2021-02-26 (×7): 20 mg via ORAL
  Filled 2021-02-20 (×7): qty 1

## 2021-02-20 MED ORDER — ONDANSETRON 4 MG PO TBDP: 4.0000 mg | ORAL_TABLET | Freq: Four times a day (QID) | ORAL | Status: DC | PRN

## 2021-02-20 MED ORDER — OXYCODONE HCL 5 MG/5ML PO SOLN
5.0000 mg | Freq: Once | ORAL | Status: DC | PRN
Start: 2021-02-20 — End: 2021-02-20

## 2021-02-20 MED ORDER — ORAL CARE MOUTH RINSE: 15.0000 mL | Freq: Once | OROMUCOSAL | Status: AC

## 2021-02-20 MED ORDER — DEXAMETHASONE SODIUM PHOSPHATE 10 MG/ML IJ SOLN
INTRAMUSCULAR | Status: DC | PRN
  Administered 2021-02-20: 5 mg via INTRAVENOUS

## 2021-02-20 MED ORDER — METOPROLOL TARTRATE 5 MG/5ML IV SOLN: 5.0000 mg | Freq: Four times a day (QID) | INTRAVENOUS | Status: DC | PRN

## 2021-02-20 MED ORDER — ACETAMINOPHEN 10 MG/ML IV SOLN
INTRAVENOUS | Status: AC
  Filled 2021-02-20: qty 100

## 2021-02-20 MED ORDER — BUPIVACAINE LIPOSOME 1.3 % IJ SUSP
20.0000 mL | Freq: Once | INTRAMUSCULAR | Status: AC
  Administered 2021-02-20: 20 mL
  Filled 2021-02-20: qty 20

## 2021-02-20 MED ORDER — ALBUTEROL SULFATE HFA 108 (90 BASE) MCG/ACT IN AERS
1.0000 | INHALATION_SPRAY | Freq: Four times a day (QID) | RESPIRATORY_TRACT | Status: DC | PRN
Start: 2021-02-20 — End: 2021-02-20

## 2021-02-20 MED ORDER — LIDOCAINE 2% (20 MG/ML) 5 ML SYRINGE
INTRAMUSCULAR | Status: AC
  Filled 2021-02-20: qty 5

## 2021-02-20 MED ORDER — ONDANSETRON HCL 4 MG/2ML IJ SOLN: 4.0000 mg | Freq: Once | INTRAMUSCULAR | Status: DC | PRN

## 2021-02-20 MED ORDER — CEFAZOLIN SODIUM-DEXTROSE 2-4 GM/100ML-% IV SOLN
INTRAVENOUS | Status: AC
  Filled 2021-02-20: qty 100

## 2021-02-20 MED ORDER — ROCURONIUM BROMIDE 10 MG/ML (PF) SYRINGE
PREFILLED_SYRINGE | INTRAVENOUS | Status: DC | PRN
  Administered 2021-02-20: 50 mg via INTRAVENOUS
  Administered 2021-02-20: 30 mg via INTRAVENOUS
  Administered 2021-02-20: 20 mg via INTRAVENOUS
  Administered 2021-02-20 (×2): 30 mg via INTRAVENOUS

## 2021-02-20 MED ORDER — ORAL CARE MOUTH RINSE: 15.0000 mL | Freq: Once | OROMUCOSAL | Status: DC

## 2021-02-20 MED ORDER — VITAMIN B-12 1000 MCG PO TABS
1000.0000 ug | ORAL_TABLET | Freq: Every day | ORAL | Status: DC
  Administered 2021-02-21 – 2021-02-27 (×7): 1000 ug via ORAL
  Filled 2021-02-20 (×7): qty 1

## 2021-02-20 MED ORDER — FENTANYL CITRATE (PF) 250 MCG/5ML IJ SOLN
INTRAMUSCULAR | Status: AC
  Filled 2021-02-20: qty 5

## 2021-02-20 MED ORDER — LACTATED RINGERS IV SOLN: INTRAVENOUS | Status: DC

## 2021-02-20 MED ORDER — LIDOCAINE 2% (20 MG/ML) 5 ML SYRINGE
INTRAMUSCULAR | Status: DC | PRN
  Administered 2021-02-20: 80 mg via INTRAVENOUS

## 2021-02-20 MED ORDER — HYDROMORPHONE HCL 1 MG/ML IJ SOLN
INTRAMUSCULAR | Status: AC
  Filled 2021-02-20: qty 1

## 2021-02-20 MED ORDER — FENTANYL CITRATE (PF) 250 MCG/5ML IJ SOLN
INTRAMUSCULAR | Status: DC | PRN
  Administered 2021-02-20 (×2): 50 ug via INTRAVENOUS
  Administered 2021-02-20: 100 ug via INTRAVENOUS

## 2021-02-20 MED ORDER — ONDANSETRON HCL 4 MG/2ML IJ SOLN
INTRAMUSCULAR | Status: AC
  Filled 2021-02-20: qty 2

## 2021-02-20 MED ORDER — CEFAZOLIN SODIUM-DEXTROSE 2-3 GM-%(50ML) IV SOLR
INTRAVENOUS | Status: DC | PRN
  Administered 2021-02-20: 2 g via INTRAVENOUS

## 2021-02-20 MED ORDER — LORATADINE 10 MG PO TABS
10.0000 mg | ORAL_TABLET | Freq: Every evening | ORAL | Status: DC
  Administered 2021-02-21 – 2021-02-26 (×6): 10 mg via ORAL
  Filled 2021-02-20 (×7): qty 1

## 2021-02-20 MED ORDER — ACETAMINOPHEN 10 MG/ML IV SOLN
1000.0000 mg | Freq: Once | INTRAVENOUS | Status: DC | PRN
  Administered 2021-02-20: 1000 mg via INTRAVENOUS

## 2021-02-20 MED ORDER — SUGAMMADEX SODIUM 200 MG/2ML IV SOLN
INTRAVENOUS | Status: DC | PRN
  Administered 2021-02-20: 200 mg via INTRAVENOUS

## 2021-02-20 MED ORDER — SODIUM CHLORIDE 0.9% FLUSH
INTRAVENOUS | Status: DC | PRN
  Administered 2021-02-20: 20 mL via INTRAVENOUS

## 2021-02-20 MED ORDER — ALBUTEROL SULFATE (2.5 MG/3ML) 0.083% IN NEBU
2.5000 mg | INHALATION_SOLUTION | Freq: Four times a day (QID) | RESPIRATORY_TRACT | Status: DC | PRN
  Administered 2021-02-22: 2.5 mg via RESPIRATORY_TRACT
  Filled 2021-02-20: qty 3

## 2021-02-20 MED ORDER — OXYCODONE HCL 5 MG PO TABS: 5.0000 mg | ORAL_TABLET | Freq: Once | ORAL | Status: DC | PRN

## 2021-02-20 MED ORDER — DEXAMETHASONE SODIUM PHOSPHATE 10 MG/ML IJ SOLN
INTRAMUSCULAR | Status: AC
  Filled 2021-02-20: qty 1

## 2021-02-20 MED ORDER — PROPOFOL 10 MG/ML IV BOLUS
INTRAVENOUS | Status: DC | PRN
  Administered 2021-02-20: 160 mg via INTRAVENOUS

## 2021-02-20 MED ORDER — DEXTROSE-NACL 5-0.9 % IV SOLN: INTRAVENOUS | Status: DC

## 2021-02-20 MED ORDER — SUCCINYLCHOLINE CHLORIDE 200 MG/10ML IV SOSY
PREFILLED_SYRINGE | INTRAVENOUS | Status: AC
  Filled 2021-02-20: qty 10

## 2021-02-20 MED ORDER — PHENYLEPHRINE HCL-NACL 10-0.9 MG/250ML-% IV SOLN
INTRAVENOUS | Status: DC | PRN
  Administered 2021-02-20: 45 ug/min via INTRAVENOUS

## 2021-02-20 MED ORDER — BUPIVACAINE-EPINEPHRINE (PF) 0.25% -1:200000 IJ SOLN
INTRAMUSCULAR | Status: AC
  Filled 2021-02-20: qty 30

## 2021-02-20 MED ORDER — STERILE WATER FOR INJECTION IJ SOLN
INTRAMUSCULAR | Status: DC | PRN
  Administered 2021-02-20: 200 mL

## 2021-02-20 MED ORDER — CHLORHEXIDINE GLUCONATE 0.12 % MT SOLN
15.0000 mL | Freq: Once | OROMUCOSAL | Status: AC
  Administered 2021-02-20: 15 mL via OROMUCOSAL

## 2021-02-20 MED ORDER — HYDROMORPHONE HCL 1 MG/ML IJ SOLN
1.0000 mg | INTRAMUSCULAR | Status: DC | PRN
  Administered 2021-02-20 – 2021-02-21 (×5): 1 mg via INTRAVENOUS
  Filled 2021-02-20 (×5): qty 1

## 2021-02-20 MED ORDER — EPHEDRINE 5 MG/ML INJ
INTRAVENOUS | Status: AC
  Filled 2021-02-20: qty 10

## 2021-02-20 MED ORDER — PROPOFOL 10 MG/ML IV BOLUS
INTRAVENOUS | Status: AC
  Filled 2021-02-20: qty 20

## 2021-02-20 MED ORDER — NITROGLYCERIN 0.4 MG SL SUBL: 0.4000 mg | SUBLINGUAL_TABLET | SUBLINGUAL | Status: DC | PRN

## 2021-02-20 MED ORDER — CHLORHEXIDINE GLUCONATE 0.12 % MT SOLN
15.0000 mL | Freq: Once | OROMUCOSAL | Status: DC
  Filled 2021-02-20: qty 15

## 2021-02-20 MED ORDER — PHENYLEPHRINE 40 MCG/ML (10ML) SYRINGE FOR IV PUSH (FOR BLOOD PRESSURE SUPPORT)
PREFILLED_SYRINGE | INTRAVENOUS | Status: AC
  Filled 2021-02-20: qty 10

## 2021-02-20 SURGICAL SUPPLY — 51 items
CHLORAPREP W/TINT 26 (MISCELLANEOUS) ×2 IMPLANT
COVER MAYO STAND STRL (DRAPES) ×2 IMPLANT
COVER SURGICAL LIGHT HANDLE (MISCELLANEOUS) ×2 IMPLANT
COVER TIP SHEARS 8 DVNC (MISCELLANEOUS) ×2 IMPLANT
COVER TIP SHEARS 8MM DA VINCI (MISCELLANEOUS) ×2
DEFOGGER SCOPE WARMER CLEARIFY (MISCELLANEOUS) ×2 IMPLANT
DERMABOND ADVANCED (GAUZE/BANDAGES/DRESSINGS) ×1
DERMABOND ADVANCED .7 DNX12 (GAUZE/BANDAGES/DRESSINGS) ×1 IMPLANT
DEVICE TROCAR PUNCTURE CLOSURE (ENDOMECHANICALS) ×4 IMPLANT
DRAIN PENROSE 0.5X18 (DRAIN) ×2 IMPLANT
DRAPE ARM DVNC X/XI (DISPOSABLE) ×4 IMPLANT
DRAPE CARDIOVASC SPLIT 88X140 (DRAPES) ×2 IMPLANT
DRAPE COLUMN DVNC XI (DISPOSABLE) ×1 IMPLANT
DRAPE DA VINCI XI ARM (DISPOSABLE) ×4
DRAPE DA VINCI XI COLUMN (DISPOSABLE) ×1
DRAPE ORTHO SPLIT 77X108 STRL (DRAPES) ×1
DRAPE SURG ORHT 6 SPLT 77X108 (DRAPES) ×1 IMPLANT
ELECT REM PT RETURN 9FT ADLT (ELECTROSURGICAL) ×2
ELECTRODE REM PT RTRN 9FT ADLT (ELECTROSURGICAL) ×1 IMPLANT
GLOVE BIO SURGEON STRL SZ7.5 (GLOVE) ×4 IMPLANT
GOWN STRL REUS W/ TWL XL LVL3 (GOWN DISPOSABLE) ×2 IMPLANT
GOWN STRL REUS W/TWL 2XL LVL3 (GOWN DISPOSABLE) ×2 IMPLANT
GOWN STRL REUS W/TWL XL LVL3 (GOWN DISPOSABLE) ×2
IRRIGATOR SUCT 8 DISP DVNC XI (IRRIGATION / IRRIGATOR) ×1 IMPLANT
IRRIGATOR SUCTION 8MM XI DISP (IRRIGATION / IRRIGATOR) ×1
KIT BASIN OR (CUSTOM PROCEDURE TRAY) ×2 IMPLANT
KIT TURNOVER KIT B (KITS) IMPLANT
MARKER SKIN DUAL TIP RULER LAB (MISCELLANEOUS) ×2 IMPLANT
MESH BIO-A 7X10 SYN MAT (Mesh General) ×2 IMPLANT
NEEDLE 22X1 1/2 (OR ONLY) (NEEDLE) ×2 IMPLANT
NEEDLE INSUFFLATION 14GA 120MM (NEEDLE) ×2 IMPLANT
SCISSORS LAP 5X35 DISP (ENDOMECHANICALS) ×2 IMPLANT
SEAL CANN UNIV 5-8 DVNC XI (MISCELLANEOUS) ×4 IMPLANT
SEAL XI 5MM-8MM UNIVERSAL (MISCELLANEOUS) ×4
SEALER VESSEL DA VINCI XI (MISCELLANEOUS) ×1
SEALER VESSEL EXT DVNC XI (MISCELLANEOUS) ×1 IMPLANT
SET IRRIG TUBING LAPAROSCOPIC (IRRIGATION / IRRIGATOR) ×2 IMPLANT
SET TUBE SMOKE EVAC HIGH FLOW (TUBING) ×2 IMPLANT
SLEEVE ENDOPATH XCEL 5M (ENDOMECHANICALS) ×2 IMPLANT
STAPLER VISISTAT 35W (STAPLE) IMPLANT
STOPCOCK 4 WAY LG BORE MALE ST (IV SETS) ×2 IMPLANT
SUT ETHIBOND 0 36 GRN (SUTURE) ×4 IMPLANT
SUT ETHIBOND 2 0 SH (SUTURE)
SUT ETHIBOND 2 0 SH 36X2 (SUTURE) IMPLANT
SUT MNCRL AB 4-0 PS2 18 (SUTURE) ×4 IMPLANT
SUT SILK 0 SH 30 (SUTURE) ×6 IMPLANT
SYR 30ML SLIP (SYRINGE) ×2 IMPLANT
TRAY FOLEY MTR SLVR 16FR STAT (SET/KITS/TRAYS/PACK) ×2 IMPLANT
TRAY LAPAROSCOPIC MC (CUSTOM PROCEDURE TRAY) ×2 IMPLANT
TROCAR ADV FIXATION 5X100MM (TROCAR) ×2 IMPLANT
TROCAR XCEL NON-BLD 5MMX100MML (ENDOMECHANICALS) ×2 IMPLANT

## 2021-02-20 NOTE — Transfer of Care (Signed)
Immediate Anesthesia Transfer of Care Note  Patient: Garrett [name]  Procedure(s) Performed: XI ROBOTIC ASSISTED HIATAL HERNIA REPAIR WITH LYSIS OF ADHESIONS AND NISSEN FUNDOPLICATION (N/A Abdomen) XI ROBOTIC ASSISTED LAPAROSCOPIC NISSEN FUNDOPLICATION (N/A Abdomen) LAPAROSCOPIC LYSIS OF ADHESIONS (N/A Abdomen)  Patient Location: PACU  Anesthesia Type:General  Level of Consciousness: drowsy and patient cooperative  Airway & Oxygen Therapy: Patient Spontanous Breathing  Post-op Assessment: Report given to RN, Post -op Vital signs reviewed and stable and Patient moving all extremities X 4  Post vital signs: Reviewed and stable  Last Vitals:  Vitals Value Taken Time  BP 131/67 02/20/21 1123  Temp    Pulse 79 02/20/21 1124  Resp 16 02/20/21 1124  SpO2 93 % 02/20/21 1124  Vitals shown include unvalidated device data.  Last Pain:  Vitals:   02/20/21 0621  TempSrc:   PainSc: 0-No pain      Patients Stated Pain Goal: 7 (69/48/54 6270)  Complications: No complications documented.

## 2021-02-20 NOTE — Anesthesia Procedure Notes (Signed)
Procedure Name: Intubation Date/Time: 02/20/2021 7:41 AM Performed by: Darletta Moll, CRNA Pre-anesthesia Checklist: Patient identified, Emergency Drugs available, Suction available and Patient being monitored Patient Re-evaluated:Patient Re-evaluated prior to induction Oxygen Delivery Method: Circle system utilized Preoxygenation: Pre-oxygenation with 100% oxygen Induction Type: IV induction and Rapid sequence Laryngoscope Size: Mac and 4 Grade View: Grade I Tube type: Oral Tube size: 7.5 mm Number of attempts: 1 Airway Equipment and Method: Stylet Placement Confirmation: ETT inserted through vocal cords under direct vision,  positive ETCO2 and breath sounds checked- equal and bilateral Secured at: 22 cm Tube secured with: Tape Dental Injury: Teeth and Oropharynx as per pre-operative assessment

## 2021-02-20 NOTE — Progress Notes (Signed)
Garrett [name] is a 70 y.o. male patient admitted. Awake, alert - oriented  X 4 - no acute distress noted.  VSS - Blood pressure 122/73, pulse 81, temperature 98 F (36.7 C), temperature source Oral, resp. rate 16, height 5\' 9"  (1.753 m), weight 101 kg, SpO2 94 %.    IV in place, occlusive dsg intact without redness.  Orientation to room, and floor completed.  Admission INP armband ID verified with patient/family, and in place.   SR up x 2, fall assessment complete, with patient and family able to verbalize understanding of risk associated with falls, and verbalized understanding to call nsg before up out of bed.  Call light within reach, patient able to voice, and demonstrate understanding. No evidence of skin break down noted on exam. 6 lap sites closed with dermabond are CDI. MD paged about CBG being 201 today, sliding scale requested, no response from MD yet. Will continue to monitor.    Will cont to eval and treat per MD orders.  Gordy Savers, RN 02/20/2021 6:53 PM

## 2021-02-20 NOTE — Op Note (Signed)
02/20/2021  11:08 AM  PATIENT:  Garrett [name]  70 y.o. male  PRE-OPERATIVE DIAGNOSIS:  RECURRENT HIATAL HERNIA  POST-OPERATIVE DIAGNOSIS: Re-RECURRENT HIATAL HERNIA  PROCEDURE:  Procedure(s): XI ROBOTIC ASSISTED HIATAL HERNIA REPAIR WITH LYSIS OF ADHESIONS TIME 60 MINUTES AND TOUPET FUNDOPLICATION (N/A)   SURGEON:  Surgeon(s) and Role:    * Ralene Ok, MD - Primary  ASSISTANTS: Pryor Curia, RNFA   ANESTHESIA:   local and general  EBL:  80 mL   BLOOD ADMINISTERED:none  DRAINS: none   LOCAL MEDICATIONS USED:  BUPIVICAINE  and OTHER exparil  SPECIMEN:  No Specimen  DISPOSITION OF SPECIMEN:  N/A  COUNTS:  YES  TOURNIQUET:  * No tourniquets in log *  DICTATION: .Dragon Dictation  Findings: Patient had a significant amount of adhesions from the body of the stomach up to the liver edge.  Patient also had significant mount of adhesions to the hiatus.  This appeared to be the area of the recurrence.  The entire wrap was taken down.  The stomach was then wrapped in a toupee fashion very loosely.  Indication for procedure: Patient is a 69 year old male, with a 3 recurrent hiatal hernia with significant reflux and dysphagia.  Patient was worked up and seen in clinic.  He was counseled in regards to nonoperative and operative treatment.  Patient decided to proceed with operative repair of his recurrent hiatal hernia.  Details of the procedure:  The patient was taken back to the operating room and placed in the supine position with bilateral SCDs in place. The patient was prepped and draped in the usual sterile fashion. After appropriate antibiotics were confirmed a timeout was called and all facts were verified.   A Veress needle technique was used to insufflate the abdomen to 15 mm of mercury the paramedian stab incision in the left upper quadrant.  Subsequent to this a 5 mm trocar was then placed intra-abdominally.  There was some omental adhesion to the anterior  abdominal wall in the midline.  A 5 mm trochars placed on the right lower quadrant.  At this time cautery and blunt dissection was used to dissect down the omentum from the anterior abdominal wall.  This was done in a way to be able to clear space for the robotic trochars. Subsequent to this an 8 mm trocar was introduced as was a 8 millimeter camera. At this time the subsequent robotic trochars x3, were then placed adjacent to this trocar approximately 8-10 cm away. Each trocar was inserted under direct visualization, there were total of 4 trochars. The assistant trocar was then placed in the right upper quadrant under direct visualization.  At this time the patient was positioned in reverse Trendelenburg.   At this time the robot patient cart was brought to the bedside and placed in good position and the arms were docked to the trochars appropriately.  At this time dissection was taken up along the liver edge where the stomach appeared to be densely adherent to the liver edge.  This was taken down with sharp dissection.  I continued to take down the omentum up towards the hiatus.  The hiatus was eventually visualized.  There was significant adhesions in this area.  Entirety of adhesiolysis was approximately 60 minutes.  At this time I was able to bluntly dissect away the left crus.  This came away and I could easily visualize the herniated fundus of the stomach.  I slowly circumferentially dissected away from surrounding tissue.  I was  able to circumferentially dissect away the esophagus which was easily visualized.  Secondary to the dense scar tissue I was unable to visualize the anterior or posterior vagus nerves.  I proceeded to dissect the mediastinum cephalad approximately 4 to 5 cm this appeared to be not previously dissected as it appeared to be a virgin territory.  The wrap was circumferentially dissected away.  The wrap was taken down and the stomach secondary to the adhesion to the liver edge appeared  to have a kink in this area with a first one third of the body.  I tried to lyse adhesions to correct this kink.   At this time I proceeded to close the hiatus using interrupted 0 Ethibonds x 3. This brought together the hiatal closure without undue stricture to the esophagus.   A piece of Gore Bio A hiatal mesh was placed over the hiatal closure and sutured to the crus using 0 Ethibond sutures x 2.  At this time the greater curvature was brought around the esophagus and sutured using 0 silk sutures interrupted fashion approximately 1 cm apart x3 on each side of the esophagus in a Toupet fashion. A left collar stitch was then used to gastropexy the stomach from the wrap to the diaphragm just lateral to the left crus as.  The wrap lay loose and posterior.  There are amount of kinking of the body of the stomach as this was adherent to the liver edge and anterior abdominal wall, however there was no obstruction.  At this time the robot was undocked. The liver trocar was removed. At this time insufflation was evacuated. Skin was reapproximated for Monocryl subcuticular fashion. The skin was then dressed with Dermabond. The patient tolerated the procedure well and was taken to the recovery room in stable condition.     PLAN OF CARE: Admit to inpatient   PATIENT DISPOSITION:  PACU - hemodynamically stable.   Delay start of Pharmacological VTE agent (>24hrs) due to surgical blood loss or risk of bleeding: not applicable

## 2021-02-20 NOTE — H&P (Signed)
History of Present Illness The patient is a 70 year old male who presents with a complaint of recurrent hiatal hernia. Patient is a 70 year old male, who comes in for evaluation of her recurrent hiatal hernia. Patient had a previous however hernia repair in 1990 in 2005. Most recently this was [place] by Dr. Stephannie Li. Patient states that on both operations these were done open. He states that he had a Nissen fundoplication. Patient states that after his most recent surgery he did well up until approximately last year. He states that since that time he said continued and increased reflux. He is currently on Prilosec which she was placed on by Dr. [name]. He states that this is helping him.  Patient states that he has significant reflux, as well as some dysphagia. Patient recently underwent esophagogram which revealed a large hiatal hernia, there appears to be no wrap, as well as reflux seen on this x-ray. I did review this personally.  Patient currently on Xarelto for previous PEs. He sees Dr. Melvyn Novas his pulmonologist.  Of note patient states that he does have diarrhea. He is unsure whether or not this began after his most recent surgery.   Allergies No Known Drug Allergies  [07/12/2020]:  Medication History Albuterol Sulfate ((2.5 MG/3ML)0.083% Nebulized Soln, Inhalation) Active. Xarelto (20MG  Tablet, Oral) Active. Verapamil HCl ER (240MG  Tablet ER, Oral) Active. Topiramate (50MG  Tablet, Oral) Active. Symbicort (80-4.5MCG/ACT Aerosol, Inhalation) Active. ProAir HFA (108 (90 Base)MCG/ACT Aerosol Soln, Inhalation) Active. predniSONE (20MG  Tablet, Oral) Active. Pravastatin Sodium (10MG  Tablet, Oral) Active. Potassium Chloride Crys ER (20MEQ Tablet ER, Oral) Active. Pantoprazole Sodium (40MG  Tablet DR, Oral) Active. Omeprazole (40MG  Capsule DR, Oral) Active. Metoprolol Tartrate (25MG  Tablet, Oral) Active. metFORMIN HCl (500MG  Tablet, Oral) Active. Memantine HCl  (10MG  Tablet, Oral) Active. Meclizine HCl (25MG  Tablet, Oral) Active. Levocetirizine Dihydrochloride (5MG  Tablet, Oral) Active. Gabapentin (100MG  Capsule, Oral) Active. Furosemide (20MG  Tablet, Oral) Active. Fenofibrate (160MG  Tablet, Oral) Active. Allopurinol (100MG  Tablet, Oral) Active. Celecoxib (200MG  Capsule, Oral) Active.  BP (!) 153/84   Pulse 78   Temp 98 F (36.7 C) (Oral)   Resp 18   Ht 5\' 9"  (1.753 m)   Wt 56.7 kg   SpO2 97%   BMI 18.46 kg/m   Physical Exam The physical exam findings are as follows: Note: Constitutional: No acute distress, conversant, appears stated age  Eyes: Anicteric sclerae, moist conjunctiva, no lid lag  Neck: No thyromegaly, trachea midline, no cervical lymphadenopathy  Lungs: Clear to auscultation biilaterally, normal respiratory effot  Cardiovascular: regular rate & rhythm, no murmurs, no peripheal edema, pedal pulses 2+  GI: Soft, no masses or hepatosplenomegaly, non-tender to palpation large midline incision.  MSK: Normal gait, no clubbing cyanosis, edema  Skin: No rashes, palpation reveals normal skin turgor  Psychiatric: Appropriate judgment and insight, oriented to person, place, and time    Assessment & Plan HIATAL HERNIA (K44.9) Impression: Patient is a 70 year old male with a recurrent hiatal hernia, with 2 previous repairs. Patient also with a history of PEs, on Xarelto. Secondary to continued reflux and dysphagia patient would like to proceed with repair. I long discussed with the patient regards to the fact that he is has recurred now 3 times. There is a higher chance of complications secondary to scar tissue, as well as increased chance of recurrence just secondary to his previous recurrences.  Patient states he would like to be scheduled for surgery in November December time..  1. Will obtain a gastric a pain study as well as  CT scan to evaluate the anatomy and function of the stomach. 2. The patient would  be a candidate for robotic had a hernia repair and TPA fundoplication. I did discuss with him this would likely require lysis of adhesions.  3. Discussed with patient the risks and benefits of the procedure to include but not limited to: Infection, bleeding, damage to structures, possible pneumothorax, possible recurrence. The patient voiced understanding and wishes to proceed.

## 2021-02-20 NOTE — Anesthesia Postprocedure Evaluation (Signed)
Anesthesia Post Note  Patient: Garrett [name]  Procedure(s) Performed: XI ROBOTIC ASSISTED HIATAL HERNIA REPAIR WITH LYSIS OF ADHESIONS AND NISSEN FUNDOPLICATION (N/A Abdomen) XI ROBOTIC ASSISTED LAPAROSCOPIC NISSEN FUNDOPLICATION (N/A Abdomen) LAPAROSCOPIC LYSIS OF ADHESIONS (N/A Abdomen) INSERTION OF MESH (N/A Abdomen)     Patient location during evaluation: PACU Anesthesia Type: General Level of consciousness: awake and alert Pain management: pain level controlled Vital Signs Assessment: post-procedure vital signs reviewed and stable Respiratory status: spontaneous breathing, nonlabored ventilation, respiratory function stable and patient connected to nasal cannula oxygen Cardiovascular status: blood pressure returned to baseline and stable Postop Assessment: no apparent nausea or vomiting Anesthetic complications: no   No complications documented.  Last Vitals:  Vitals:   02/20/21 1440 02/20/21 1510  BP: 110/65 124/66  Pulse: 70 74  Resp: 10 11  Temp:    SpO2: 97% 97%    Last Pain:  Vitals:   02/20/21 1510  TempSrc:   PainSc: Asleep                 Belenda Cruise P Amandajo Gonder

## 2021-02-21 ENCOUNTER — Encounter (HOSPITAL_COMMUNITY): Payer: Self-pay | Admitting: General Surgery

## 2021-02-21 ENCOUNTER — Inpatient Hospital Stay (HOSPITAL_COMMUNITY): Payer: Medicare Other

## 2021-02-21 LAB — BASIC METABOLIC PANEL
Anion gap: 13 (ref 5–15)
BUN: 15 mg/dL (ref 8–23)
CO2: 19 mmol/L — ABNORMAL LOW (ref 22–32)
Calcium: 9 mg/dL (ref 8.9–10.3)
Chloride: 113 mmol/L — ABNORMAL HIGH (ref 98–111)
Creatinine, Ser: 1.18 mg/dL (ref 0.61–1.24)
GFR, Estimated: >60 mL/min (ref >60)
Glucose, Bld: 144 mg/dL — ABNORMAL HIGH (ref 70–99)
Potassium: 4.2 mmol/L (ref 3.5–5.1)
Sodium: 145 mmol/L (ref 135–145)

## 2021-02-21 MED ORDER — HYDROCODONE-ACETAMINOPHEN 7.5-325 MG/15ML PO SOLN: 15.0000 mL | Freq: Four times a day (QID) | ORAL | 0 refills | Status: DC | PRN

## 2021-02-21 MED ORDER — IOHEXOL 300 MG/ML  SOLN
150.0000 mL | Freq: Once | INTRAMUSCULAR | Status: AC | PRN
  Administered 2021-02-21: 100 mL via ORAL

## 2021-02-21 NOTE — Discharge Summary (Signed)
Physician Discharge Summary  Patient ID: Garrett [name] MRN: [id number] DOB/AGE: 12/27/1950 70 y.o.  Admit date: 02/20/2021 Discharge date: 02/21/2021  Admission Diagnoses: Re-recurrent hiatal hernia Discharge Diagnoses:  Status post robotic hiatal hernia repair and toupee fundoplication Discharged Condition: good  Hospital Course: Patient underwent above surgery. Postoperative patient was sent to the floor.  Patient was ambulating well on his own.  Patient had a esophagram on postop day 1.  This appeared showed no leak and appropriate wrap.  This associated reduction of the hernia. Patient was on a liquid diet.  Patient tolerated this well.  Patient was otherwise deemed stable for discharge and discharged home.  Consults: None  Significant Diagnostic Studies: as above  Treatments: surgery: as above  Discharge Exam: Blood pressure 103/70, pulse 75, temperature 99.3 F (37.4 C), temperature source Oral, resp. rate 17, height 5\' 9"  (1.753 m), weight 101 kg, SpO2 94 %. General appearance: alert and cooperative GI: soft, non-tender; bowel sounds normal; no masses,  no organomegaly and inc c/d/i  Disposition: Discharge disposition: 01-Home or Self Care       Discharge Instructions    Diet - low sodium heart healthy   Complete by: As directed    Increase activity slowly   Complete by: As directed      Allergies as of 02/21/2021      Reactions   Bee Venom Anaphylaxis   Garlic Swelling      Medication List    TAKE these medications   albuterol (2.5 MG/3ML) 0.083% nebulizer solution Commonly known as: PROVENTIL Take 3 mLs (2.5 mg total) by nebulization every 6 (six) hours as needed for wheezing or shortness of breath. What changed: Another medication with the same name was changed. Make sure you understand how and when to take each.   ProAir HFA 108 (90 Base) MCG/ACT inhaler Generic drug: albuterol Inhale 1 puff into the lungs every 6 (six) hours as needed for wheezing  or shortness of breath. What changed: how much to take   allopurinol 100 MG tablet Commonly known as: ZYLOPRIM TAKE 1 TABLET BY MOUTH  DAILY   atorvastatin 40 MG tablet Commonly known as: LIPITOR Take 40 mg by mouth every evening.   budesonide-formoterol 80-4.5 MCG/ACT inhaler Commonly known as: SYMBICORT Inhale 2 puffs into the lungs daily.   celecoxib 200 MG capsule Commonly known as: CELEBREX TAKE 1 CAPSULE BY MOUTH  TWICE DAILY What changed:   how much to take  when to take this   EPINEPHrine 0.3 mg/0.3 mL Soaj injection Commonly known as: EPI-PEN Inject 0.3 mg into the muscle as needed for anaphylaxis.   famotidine 20 MG tablet Commonly known as: Pepcid Take 1 tablet (20 mg total) by mouth at bedtime.   fenofibrate 160 MG tablet TAKE 1 TABLET BY MOUTH  DAILY   fluticasone 50 MCG/ACT nasal spray Commonly known as: FLONASE Place 2 sprays into both nostrils daily. What changed:   when to take this  reasons to take this   furosemide 40 MG tablet Commonly known as: LASIX Take 40 mg by mouth daily.   furosemide 20 MG tablet Commonly known as: LASIX TAKE 1 TABLET BY MOUTH  DAILY   gabapentin 100 MG capsule Commonly known as: NEURONTIN TAKE 2 CAPSULES BY MOUTH AT BEDTIME   HYDROcodone-acetaminophen 7.5-325 mg/15 ml solution Commonly known as: HYCET Take 15 mLs by mouth 4 (four) times daily as needed for moderate pain.   levocetirizine 5 MG tablet Commonly known as: XYZAL Take 1 tablet (  5 mg total) by mouth every evening.   magnesium oxide 400 (241.3 Mg) MG tablet Commonly known as: MAG-OX Take 1 tablet (400 mg total) by mouth daily.   meclizine 25 MG tablet Commonly known as: ANTIVERT Take 1 tablet (25 mg total) by mouth 3 (three) times daily as needed for dizziness. What changed: when to take this   memantine 10 MG tablet Commonly known as: NAMENDA TAKE 2 TABLETS BY MOUTH  DAILY   metFORMIN 500 MG tablet Commonly known as: GLUCOPHAGE TAKE 1  TABLET BY MOUTH  TWICE DAILY WITH A MEAL What changed: when to take this   metoprolol tartrate 25 MG tablet Commonly known as: LOPRESSOR TAKE 1 TABLET BY MOUTH  TWICE DAILY   nitroGLYCERIN 0.4 MG SL tablet Commonly known as: NITROSTAT Place 0.4 mg under the tongue every 5 (five) minutes as needed for chest pain.   omeprazole 40 MG capsule Commonly known as: PRILOSEC Take 1 capsule shortly before breakfast and 1 capsule shortly before dinner meal. What changed:   how much to take  how to take this  when to take this  additional instructions   ONE-A-DAY WEIGHT SMART ADVANCE PO Take 1 tablet by mouth daily. Centrum Silver   OneTouch Ultra test strip Generic drug: glucose blood USE AS DIRECTED THREE TIMES DAILY.   potassium chloride SA 20 MEQ tablet Commonly known as: KLOR-CON TAKE 2 TABLETS BY MOUTH  DAILY What changed: when to take this   SUMAtriptan 50 MG tablet Commonly known as: IMITREX Take 1 tablet as needed for migraine/vertigo. Do not take more than 3 a week What changed:   how much to take  how to take this  when to take this  reasons to take this  additional instructions   topiramate 50 MG tablet Commonly known as: TOPAMAX Take 1 tablet (50 mg total) by mouth 2 (two) times daily.   verapamil 240 MG CR tablet Commonly known as: CALAN-SR TAKE 1 TABLET BY MOUTH AT  BEDTIME   vitamin B-12 1000 MCG tablet Commonly known as: CYANOCOBALAMIN Take 1,000 mcg by mouth daily.   Xarelto 20 MG Tabs tablet Generic drug: rivaroxaban TAKE 1 TABLET BY MOUTH  DAILY WITH SUPPER What changed:   how much to take  how to take this  when to take this  additional instructions        Signed: Ralene Ok 02/21/2021, 12:24 PM

## 2021-02-21 NOTE — Progress Notes (Addendum)
Patient d/cd home order is discontinue this evening per MD . Pt unable to tolerate Full liquid diet. Started to vomit and c/o worsening abdominal pain. Medicated with PRN Zofran/ Dilaudid. Will continue to monitor. Call bell within reach.

## 2021-02-21 NOTE — Plan of Care (Signed)
Nutrition Education Note  RD consulted for nutrition education regarding jaw fracture  RD provided "Diet after a Nissen Fundoplication" handout. Reviewed patient's dietary recall. Provided examples on ways to modify foods to be of pureed/liquid consistency. Discouraged intake of solid or hard textured foods.  Discussed ways that pt could utilize a blender or food processor to achieve desired consistency. Encouraged increased protein intake and soups, and smoothies.  RD discussed why it is important for patient to adhere to diet recommendations. Discussed plan to continue with current diet advancing to soft diet as able. Teach back method used.  Expect good compliance.  Body mass index is 32.88 kg/m. Pt meets criteria for obesity based on current BMI.  Current diet order is NPO at this time. Labs and medications reviewed. No further nutrition interventions warranted at this time. RD contact information provided. If additional nutrition issues arise, please re-consult RD.   Lockie Pares., RD, LDN, CNSC See AMiON for contact information

## 2021-02-22 ENCOUNTER — Inpatient Hospital Stay (HOSPITAL_COMMUNITY): Payer: Medicare Other

## 2021-02-22 DIAGNOSIS — Z9889 Other specified postprocedural states: Secondary | ICD-10-CM

## 2021-02-22 DIAGNOSIS — J9601 Acute respiratory failure with hypoxia: Secondary | ICD-10-CM | POA: Diagnosis not present

## 2021-02-22 DIAGNOSIS — R0603 Acute respiratory distress: Secondary | ICD-10-CM

## 2021-02-22 DIAGNOSIS — J189 Pneumonia, unspecified organism: Secondary | ICD-10-CM | POA: Diagnosis not present

## 2021-02-22 LAB — GLUCOSE, CAPILLARY
Glucose-Capillary: 163 mg/dL — ABNORMAL HIGH (ref 70–99)
Glucose-Capillary: 141 mg/dL — ABNORMAL HIGH (ref 70–99)
Glucose-Capillary: 147 mg/dL — ABNORMAL HIGH (ref 70–99)
Glucose-Capillary: 146 mg/dL — ABNORMAL HIGH (ref 70–99)
Glucose-Capillary: 181 mg/dL — ABNORMAL HIGH (ref 70–99)
Glucose-Capillary: 186 mg/dL — ABNORMAL HIGH (ref 70–99)

## 2021-02-22 LAB — HEMOGLOBIN A1C
Hgb A1c MFr Bld: 6.8 % — ABNORMAL HIGH (ref 4.8–5.6)
Mean Plasma Glucose: 148.46 mg/dL

## 2021-02-22 LAB — BASIC METABOLIC PANEL
Anion gap: 12 (ref 5–15)
BUN: 13 mg/dL (ref 8–23)
CO2: 18 mmol/L — ABNORMAL LOW (ref 22–32)
Calcium: 8.8 mg/dL — ABNORMAL LOW (ref 8.9–10.3)
Chloride: 114 mmol/L — ABNORMAL HIGH (ref 98–111)
Creatinine, Ser: 1.24 mg/dL (ref 0.61–1.24)
GFR, Estimated: >60 mL/min (ref >60)
Glucose, Bld: 197 mg/dL — ABNORMAL HIGH (ref 70–99)
Potassium: 3.4 mmol/L — ABNORMAL LOW (ref 3.5–5.1)
Sodium: 144 mmol/L (ref 135–145)

## 2021-02-22 LAB — BLOOD GAS, ARTERIAL
Acid-base deficit: 5.1 mmol/L — ABNORMAL HIGH (ref 0.0–2.0)
Bicarbonate: 19.4 mmol/L — ABNORMAL LOW (ref 20.0–28.0)
Drawn by: 33099
FIO2: 100
O2 Saturation: 89.7 %
Patient temperature: 39.2
pCO2 arterial: 39.2 mmHg (ref 32.0–48.0)
pH, Arterial: 7.328 — ABNORMAL LOW (ref 7.350–7.450)
pO2, Arterial: 70.3 mmHg — ABNORMAL LOW (ref 83.0–108.0)

## 2021-02-22 LAB — CBC
HCT: 35.5 % — ABNORMAL LOW (ref 39.0–52.0)
Hemoglobin: 11.2 g/dL — ABNORMAL LOW (ref 13.0–17.0)
MCH: 29 pg (ref 26.0–34.0)
MCHC: 31.5 g/dL (ref 30.0–36.0)
MCV: 92 fL (ref 80.0–100.0)
Platelets: 270 10*3/uL (ref 150–400)
RBC: 3.86 MIL/uL — ABNORMAL LOW (ref 4.22–5.81)
RDW: 14.9 % (ref 11.5–15.5)
WBC: 6.3 10*3/uL (ref 4.0–10.5)
nRBC: 0 % (ref 0.0–0.2)

## 2021-02-22 LAB — URINALYSIS, ROUTINE W REFLEX MICROSCOPIC
Bilirubin Urine: NEGATIVE
Glucose, UA: NEGATIVE mg/dL
Ketones, ur: NEGATIVE mg/dL
Nitrite: NEGATIVE
Protein, ur: NEGATIVE mg/dL
Specific Gravity, Urine: 1.013 (ref 1.005–1.030)
pH: 5 (ref 5.0–8.0)

## 2021-02-22 LAB — BRAIN NATRIURETIC PEPTIDE: B Natriuretic Peptide: 149.2 pg/mL — ABNORMAL HIGH (ref 0.0–100.0)

## 2021-02-22 LAB — LACTIC ACID, PLASMA: Lactic Acid, Venous: 2.3 mmol/L (ref 0.5–1.9)

## 2021-02-22 LAB — TROPONIN I (HIGH SENSITIVITY): Troponin I (High Sensitivity): 24 ng/L — ABNORMAL HIGH (ref <18)

## 2021-02-22 LAB — MRSA PCR SCREENING: MRSA by PCR: NEGATIVE

## 2021-02-22 MED ORDER — FLUMAZENIL 0.5 MG/5ML IV SOLN
INTRAVENOUS | Status: AC
  Filled 2021-02-22: qty 5

## 2021-02-22 MED ORDER — FUROSEMIDE 10 MG/ML IJ SOLN
20.0000 mg | Freq: Once | INTRAMUSCULAR | Status: AC
  Administered 2021-02-22: 20 mg via INTRAVENOUS
  Filled 2021-02-22: qty 2

## 2021-02-22 MED ORDER — LORAZEPAM 2 MG/ML IJ SOLN
INTRAMUSCULAR | Status: AC
  Administered 2021-02-22: 0.5 mg via INTRAVENOUS
  Filled 2021-02-22: qty 1

## 2021-02-22 MED ORDER — FUROSEMIDE 10 MG/ML IJ SOLN
INTRAMUSCULAR | Status: AC
  Administered 2021-02-22: 20 mg via INTRAVENOUS
  Filled 2021-02-22: qty 2

## 2021-02-22 MED ORDER — IPRATROPIUM-ALBUTEROL 0.5-2.5 (3) MG/3ML IN SOLN
3.0000 mL | RESPIRATORY_TRACT | Status: DC | PRN
  Administered 2021-02-26: 3 mL via RESPIRATORY_TRACT
  Filled 2021-02-22: qty 3

## 2021-02-22 MED ORDER — POTASSIUM CHLORIDE 10 MEQ/100ML IV SOLN
10.0000 meq | INTRAVENOUS | Status: AC
  Administered 2021-02-22 (×4): 10 meq via INTRAVENOUS
  Filled 2021-02-22 (×4): qty 100

## 2021-02-22 MED ORDER — FLUMAZENIL 0.5 MG/5ML IV SOLN
0.2000 mg | Freq: Once | INTRAVENOUS | Status: AC
  Administered 2021-02-22: 0.2 mg via INTRAVENOUS
  Filled 2021-02-22: qty 5

## 2021-02-22 MED ORDER — SODIUM CHLORIDE 0.9 % IV SOLN
2.0000 g | Freq: Three times a day (TID) | INTRAVENOUS | Status: DC
  Administered 2021-02-22 – 2021-02-23 (×4): 2 g via INTRAVENOUS
  Filled 2021-02-22 (×3): qty 2

## 2021-02-22 MED ORDER — INSULIN ASPART 100 UNIT/ML ~~LOC~~ SOLN
0.0000 [IU] | Freq: Three times a day (TID) | SUBCUTANEOUS | Status: DC
  Administered 2021-02-22 – 2021-02-24 (×4): 2 [IU] via SUBCUTANEOUS
  Administered 2021-02-24: 3 [IU] via SUBCUTANEOUS
  Administered 2021-02-25: 2 [IU] via SUBCUTANEOUS
  Administered 2021-02-25: 3 [IU] via SUBCUTANEOUS
  Administered 2021-02-25: 2 [IU] via SUBCUTANEOUS
  Administered 2021-02-26 – 2021-02-27 (×5): 3 [IU] via SUBCUTANEOUS

## 2021-02-22 MED ORDER — FUROSEMIDE 10 MG/ML IJ SOLN: 20.0000 mg | Freq: Once | INTRAMUSCULAR | Status: AC

## 2021-02-22 MED ORDER — MOMETASONE FURO-FORMOTEROL FUM 100-5 MCG/ACT IN AERO
2.0000 | INHALATION_SPRAY | Freq: Two times a day (BID) | RESPIRATORY_TRACT | Status: DC
  Administered 2021-02-22 – 2021-02-27 (×11): 2 via RESPIRATORY_TRACT
  Filled 2021-02-22 (×2): qty 8.8

## 2021-02-22 MED ORDER — ENOXAPARIN SODIUM 40 MG/0.4ML ~~LOC~~ SOLN
40.0000 mg | SUBCUTANEOUS | Status: DC
  Administered 2021-02-22: 40 mg via SUBCUTANEOUS
  Filled 2021-02-22: qty 0.4

## 2021-02-22 MED ORDER — RIVAROXABAN 20 MG PO TABS
20.0000 mg | ORAL_TABLET | Freq: Every day | ORAL | Status: DC
  Administered 2021-02-22 – 2021-02-26 (×5): 20 mg via ORAL
  Filled 2021-02-22 (×5): qty 1

## 2021-02-22 MED ORDER — CHLORHEXIDINE GLUCONATE CLOTH 2 % EX PADS
6.0000 | MEDICATED_PAD | Freq: Every day | CUTANEOUS | Status: DC
  Administered 2021-02-22 – 2021-02-24 (×3): 6 via TOPICAL

## 2021-02-22 MED ORDER — LORAZEPAM 2 MG/ML IJ SOLN: 0.5000 mg | Freq: Once | INTRAMUSCULAR | Status: AC

## 2021-02-22 MED ORDER — IPRATROPIUM-ALBUTEROL 0.5-2.5 (3) MG/3ML IN SOLN
3.0000 mL | RESPIRATORY_TRACT | Status: DC
  Administered 2021-02-22 (×2): 3 mL via RESPIRATORY_TRACT
  Filled 2021-02-22 (×2): qty 3

## 2021-02-22 MED ORDER — ACETAMINOPHEN 325 MG PO TABS
650.0000 mg | ORAL_TABLET | Freq: Four times a day (QID) | ORAL | Status: DC | PRN
  Administered 2021-02-22 – 2021-02-25 (×4): 650 mg via ORAL
  Filled 2021-02-22 (×4): qty 2

## 2021-02-22 MED ORDER — ACETAMINOPHEN 10 MG/ML IV SOLN
1000.0000 mg | Freq: Four times a day (QID) | INTRAVENOUS | Status: DC | PRN
  Administered 2021-02-22: 1000 mg via INTRAVENOUS
  Filled 2021-02-22 (×2): qty 100

## 2021-02-22 NOTE — Progress Notes (Signed)
Pharmacy Antibiotic Note  Garrett [name] is a 70 y.o. male admitted on 02/20/2021 with recurrent hiatal hernia. Patient hypoxic overnight and transferred to ICU. Pharmacy has been consulted for cefepime dosing for pneumonia. Wbc wnl. Tmax 102.7. Chest xray favoring atelectasis/scarring with developing infiltrate not excluded. CrCl ~65 ml.min.   Plan: Start cefepime 2g IV q8h  Monitor renal function, cultures, and clinical progression   Height: 5\' 9"  (175.3 cm) Weight: 101 kg (222 lb 10.6 oz) IBW/kg (Calculated) : 70.7  Temp (24hrs), Avg:100.3 F (37.9 C), Min:98.3 F (36.8 C), Max:102.7 F (39.3 C)  Recent Labs  Lab 02/20/21 0547 02/21/21 0133 02/22/21 0403  WBC 8.2  --  6.3  CREATININE 1.40* 1.18 1.24  LATICACIDVEN  --   --  2.3*    Estimated Creatinine Clearance: 64.9 mL/min (by C-G formula based on SCr of 1.24 mg/dL).    Allergies  Allergen Reactions  . Bee Venom Anaphylaxis  . Garlic Swelling    Antimicrobials this admission: Cefazolin x1 3/1  Cefepime 3/3 >>   Dose adjustments this admission: N/a  Microbiology results: 3/3 ucx:  3/3 bcx:  3/3 resp cx:   Thank you for allowing pharmacy to be a part of this patient's care.  Cristela Felt, PharmD Clinical Pharmacist  02/22/2021 8:45 AM

## 2021-02-22 NOTE — Progress Notes (Signed)
Spoke to Dr Bobbye Morton about poor urine output after lasix dose given, patient bladder scanned with >433ml , and new orders given to place foley cath at this time.

## 2021-02-22 NOTE — Progress Notes (Signed)
RT note. Pt. Placed on auto CPAP 20/5 with 3L in line. Pt. Resting comfortable at this time. RT will continue to monitor.

## 2021-02-22 NOTE — Consult Note (Addendum)
NAME:  Garrett [name], MRN:  [id number], DOB:  Feb 22, 1951, LOS: 2 ADMISSION DATE:  02/20/2021, CONSULTATION DATE:  02/22/21 REFERRING MD:  [name], CHIEF COMPLAINT:  Shortness of breath   Brief History:  70 y.o. M with PMH of Asthma, HFpEF, DM, PE on xarelto, HTN, OSA, who was admitted 3/1 for operative repair of recurrent hiatal hernia.  He was doing well post-op until he had acute onset of fever, shortness of breath and hypoxia in the early morning hours of 3/3 and required non-rebreather mask.  Transferred to ICU.  History of Present Illness:  Garrett [name] is a 69 y.o. M with PMH of Asthma, HFpEF, DM, PE on xarelto, HTN, OSA, who was admitted 3/1 for operative repair of recurrent hiatal hernia.   He underwent robotic hiatal hernia repair with lysis of adhesions and Nissen Fundoplification without complication on 3/1 and was recovering well until he got up to use the restroom in the early morning hours of 3/3 and had acute onset of hypoxia and AMS.  He also developed a fever of 102.63F with progressed bibasilar opacities on CXR.   He was given Ativan and later Romazicon and required 15L non-rebreather mask.   ABG 7.3/39/70/19.   He was transferred to intensive care and remained stable on 15L, though with significant work of breathing.   Past Medical History:   has a past medical history of Allergy, Arthritis, Asthma, Cataract, Cellulitis (2013), CHF (congestive heart failure) ([place]), Colon polyps, Diabetes mellitus, Dyspnea, GERD (gastroesophageal reflux disease), Gout, Heart murmur, History of hiatal hernia, History of pulmonary embolus (PE), HOH (hard of hearing), Hypertension, Memory loss, short term ('07), Migraine headache without aura, Pneumonia, Pulmonary embolism ([place]), Skin cancer, Sleep apnea, and Sty, external (06/2019).   Significant Hospital Events:  3/1 admit and operative hernia repair  Consults:  PCCM  Procedures:    Significant Diagnostic Tests:  3/3 CXR>>1. Cardiomegaly with  vascular congestion and possible trace to small bilateral pleural effusions. 2. Streaky bibasilar and retrocardiac airspace opacities, progressed from prior study. Findings are favored to represent atelectasis or scarring with a developing infiltrate not excluded.  Micro Data:  2/26 Covid-19, Flu>>negative 3/1 BCx2>> 3/1 sputum cx>>  Antimicrobials:   Cefepime 3/1-  Interim History / Subjective:  As above  Objective   Blood pressure 116/62, pulse 94, temperature 99 F (37.2 C), temperature source Oral, resp. rate (!) 28, height 5\' 9"  (1.753 m), weight 101 kg, SpO2 90 %.    FiO2 (%):  [100 %] 100 %   Intake/Output Summary (Last 24 hours) at 02/22/2021 7793 Last data filed at 02/22/2021 0600 Gross per 24 hour  Intake 240 ml  Output 900 ml  Net -660 ml   Filed Weights   02/20/21 0545 02/20/21 0922  Weight: 56.7 kg 101 kg   General:  Elderly M, well-nourished, awake in moderate respiratory distress HEENT: MM pink/moist,  Neuro: awake, alert and oriented, though slow to answer questions  CV: s1s2 rrr, no m/r/g PULM:  tachypnic with mild accessory muscle use, adequate oxygen saturations on 15L non-rebreather with decreased air entry bilateral bases, but without significant wheezing or rhonchi GI: soft, bsx4 active  Extremities: warm/dry, no edema  Skin: no rashes or lesions   Resolved Hospital Problem list     Assessment & Plan:   Acute hypoxic respiratory failure, likely secondary to pneumonia in the setting of  Recent hiatal hernia repair with hx of HFpEF,  PE on Xarelto, Cough variant Asthma and OSA  -Pt reports  vomiting post-op, possible aspiration -Lactic 2.3, no hypotension, no leukocytosis  -Has history of PE, Xarelto held peri-operatively, however on POCUS no clear evidence of RH strain -Last formal Echo 12/2018 with EF 55-60% and grade 1 diastolic dysfunction P: -Monitor respiratory status closely, at risk for intubation, continue oxygen support to maintain sats  >92% -Cefepime, respiratory cultures  -received a total of 40mg  Lasix this morning, monitor UOP -Resume Xarelto, low suspicion for acute PE -Check Troponin and BNP -prn duonebs, on symbicort start [facility] inpatient, follows with Dr. Melvyn Novas for Asthma -qhs CPAP      Best practice (evaluated daily)  Diet: NPO Pain/Anxiety/Delirium protocol (if indicated): n/a VAP protocol (if indicated): n/a DVT prophylaxis: xarelto GI prophylaxis: n/a Glucose control: SSI Mobility: bed rest Disposition:ICU  Goals of Care:  Last date of multidisciplinary goals of care discussion: Family and staff present:  Summary of discussion:  Follow up goals of care discussion due: 3/10 Code Status: Family updated at the bedside, continue full code and aggressive care   Labs   CBC: Recent Labs  Lab 02/20/21 0547 02/22/21 0403  WBC 8.2 6.3  HGB 12.0* 11.2*  HCT 38.4* 35.5*  MCV 91.0 92.0  PLT 292 751    Basic Metabolic Panel: Recent Labs  Lab 02/20/21 0547 02/21/21 0133 02/22/21 0403  NA 142 145 144  K 3.4* 4.2 3.4*  CL 108 113* 114*  CO2 22 19* 18*  GLUCOSE 150* 144* 197*  BUN 18 15 13   CREATININE 1.40* 1.18 1.24  CALCIUM 9.9 9.0 8.8*   GFR: Estimated Creatinine Clearance: 64.9 mL/min (by C-G formula based on SCr of 1.24 mg/dL). Recent Labs  Lab 02/20/21 0547 02/22/21 0403  WBC 8.2 6.3  LATICACIDVEN  --  2.3*    Liver Function Tests: Recent Labs  Lab 02/20/21 0547  AST 29  ALT 49*  ALKPHOS 41  BILITOT 0.6  PROT 6.7  ALBUMIN 3.6   No results for input(s): LIPASE, AMYLASE in the last 168 hours. No results for input(s): AMMONIA in the last 168 hours.  ABG    Component Value Date/Time   PHART 7.328 (L) 02/22/2021 0340   PCO2ART 39.2 02/22/2021 0340   PO2ART 70.3 (L) 02/22/2021 0340   HCO3 19.4 (L) 02/22/2021 0340   ACIDBASEDEF 5.1 (H) 02/22/2021 0340   O2SAT 89.7 02/22/2021 0340     Coagulation Profile: No results for input(s): INR, PROTIME in the last 168  hours.  Cardiac Enzymes: No results for input(s): CKTOTAL, CKMB, CKMBINDEX, TROPONINI in the last 168 hours.  HbA1C: Hgb A1c MFr Bld  Date/Time Value Ref Range Status  11/15/2020 08:09 AM 6.7 (H) 4.6 - 6.5 % Final    Comment:    Glycemic Control Guidelines for People with Diabetes:Non Diabetic:  <6%Goal of Therapy: <7%Additional Action Suggested:  >8%   08/17/2020 09:48 AM 6.3 (H) <5.7 % of total Hgb Final    Comment:    For someone without known diabetes, a hemoglobin  A1c value between 5.7% and 6.4% is consistent with prediabetes and should be confirmed with a  follow-up test. . For someone with known diabetes, a value <7% indicates that their diabetes is well controlled. A1c targets should be individualized based on duration of diabetes, age, comorbid conditions, and other considerations. . This assay result is consistent with an increased risk of diabetes. . Currently, no consensus exists regarding use of hemoglobin A1c for diagnosis of diabetes for children. .     CBG: Recent Labs  Lab 02/20/21 [phone]  02/20/21 1125 02/20/21 1900 02/22/21 0324  GLUCAP 140* 201* 160* 163*    Review of Systems:   Negative except as noted in HPI  Past Medical History:  He,  has a past medical history of Allergy, Arthritis, Asthma, Cataract, Cellulitis (2013), CHF (congestive heart failure) ([place]), Colon polyps, Diabetes mellitus, Dyspnea, GERD (gastroesophageal reflux disease), Gout, Heart murmur, History of hiatal hernia, History of pulmonary embolus (PE), HOH (hard of hearing), Hypertension, Memory loss, short term ('07), Migraine headache without aura, Pneumonia, Pulmonary embolism ([place]), Skin cancer, Sleep apnea, and Sty, external (06/2019).   Surgical History:   Past Surgical History:  Procedure Laterality Date  . ANTERIOR CERVICAL DECOMP/DISCECTOMY FUSION N/A 02/25/2014   Procedure: ANTERIOR CERVICAL DECOMPRESSION/DISCECTOMY FUSION 1 LEVEL five/six;  Surgeon: [name], MD;   Location: [place] [facility];  Service: Neurosurgery;  Laterality: N/A;  . CARDIAC CATHETERIZATION  '94   radial artery approach; normal coronaries 1994 ([facility])  . CATARACT EXTRACTION     Bil/ 2 weeks ago  . COLONOSCOPY  09/26/2015  . colonoscopy with polypectomy  2013  . EYE SURGERY     muscle in left eye  . HIATAL HERNIA REPAIR     done three times: '82 and 04  . incision and drain  '03   staph infection right elbow - required open surgery  . INSERTION OF MESH N/A 02/20/2021   Procedure: INSERTION OF MESH;  Surgeon: Ralene Ok, MD;  Location: [facility];  Service: General;  Laterality: N/A;  . LAPAROSCOPIC LYSIS OF ADHESIONS N/A 02/20/2021   Procedure: LAPAROSCOPIC LYSIS OF ADHESIONS;  Surgeon: Ralene Ok, MD;  Location: [facility];  Service: General;  Laterality: N/A;  . LUMBAR LAMINECTOMY/DECOMPRESSION MICRODISCECTOMY Right 02/25/2014   Procedure: LUMBAR LAMINECTOMY/DECOMPRESSION MICRODISCECTOMY 1 LEVEL four/five;  Surgeon: [name], MD;  Location: [place] [facility];  Service: Neurosurgery;  Laterality: Right;  . MAXIMUM ACCESS (MAS)POSTERIOR LUMBAR INTERBODY FUSION (PLIF) 1 LEVEL N/A 11/14/2014   Procedure: Lumbar two-three Maximum Access Surgery Posterior Lumbar Interbody Fusion;  Surgeon: [name], MD;  Location: [place] [facility];  Service: Neurosurgery;  Laterality: N/A;  . MYRINGOTOMY     several occasions '02-'03 for dizziness  . ORIF [place]   jumping off a wall  . STRABISMUS SURGERY  1994   left eye  . UPPER GASTROINTESTINAL ENDOSCOPY  06/02/2020   numerous in past  . VASECTOMY    . XI ROBOTIC ASSISTED HIATAL HERNIA REPAIR N/A 02/20/2021   Procedure: XI ROBOTIC ASSISTED HIATAL HERNIA REPAIR WITH LYSIS OF ADHESIONS AND NISSEN FUNDOPLICATION;  Surgeon: Ralene Ok, MD;  Location: [facility];  Service: General;  Laterality: N/A;     Social History:   reports that he quit smoking about 30 years ago. His smoking use included cigarettes. He has a 90.00 pack-year  smoking history. His smokeless tobacco use includes snuff. He reports previous alcohol use. He reports that he does not use drugs.   Family History:  His family history includes Dementia in his mother; Diabetes in his maternal grandmother; Heart attack in his maternal grandfather; Heart attack (age of onset: 46) in his paternal grandfather; Hypertension in his mother and sister; Stroke in his paternal grandfather. There is no history of Colon cancer, Stomach cancer, Esophageal cancer, or Rectal cancer.   Allergies Allergies  Allergen Reactions  . Bee Venom Anaphylaxis  . Garlic Swelling     Home Medications  Prior to Admission medications   Medication Sig Start Date End Date  Taking? Authorizing Provider  allopurinol (ZYLOPRIM) 100 MG tablet TAKE 1 TABLET BY MOUTH  DAILY Patient taking differently: Take 100 mg by mouth daily. 06/13/20  Yes [name] R, DO  atorvastatin (LIPITOR) 40 MG tablet Take 40 mg by mouth every evening. 09/08/20  Yes [provider]  budesonide-formoterol (SYMBICORT) 80-4.5 MCG/ACT inhaler Inhale 2 puffs into the lungs daily. 06/03/20  Yes [provider]  celecoxib (CELEBREX) 200 MG capsule TAKE 1 CAPSULE BY MOUTH  TWICE DAILY Patient taking differently: Take 400 mg by mouth daily. 09/12/20  Yes [name] R, DO  famotidine (PEPCID) 20 MG tablet Take 1 tablet (20 mg total) by mouth at bedtime. 04/19/20  Yes Milus Banister, MD  fenofibrate 160 MG tablet TAKE 1 TABLET BY MOUTH  DAILY 01/30/21  Yes Carollee Herter, Yvonne R, DO  fluticasone (FLONASE) 50 MCG/ACT nasal spray Place 2 sprays into both nostrils daily. Patient taking differently: Place 2 sprays into both nostrils daily as needed for allergies. 09/10/19  Yes [name] R, DO  furosemide (LASIX) 40 MG tablet Take 40 mg by mouth daily.   Yes [provider]  gabapentin (NEURONTIN) 100 MG capsule TAKE 2 CAPSULES BY MOUTH AT BEDTIME Patient taking differently: Take 200  mg by mouth at bedtime. 05/08/20  Yes Lyndal Pulley, DO  HYDROcodone-acetaminophen (HYCET) 7.5-325 mg/15 ml solution Take 15 mLs by mouth 4 (four) times daily as needed for moderate pain. 02/21/21 02/21/22 Yes Ralene Ok, MD  levocetirizine (XYZAL) 5 MG tablet Take 1 tablet (5 mg total) by mouth every evening. 10/24/20  Yes [name] R, DO  magnesium oxide (MAG-OX) 400 (241.3 Mg) MG tablet Take 1 tablet (400 mg total) by mouth daily. 03/03/17  Yes Reyne Dumas, MD  meclizine (ANTIVERT) 25 MG tablet Take 1 tablet (25 mg total) by mouth 3 (three) times daily as needed for dizziness. Patient taking differently: Take 25 mg by mouth as needed for dizziness. 02/14/20  Yes [name] R, DO  memantine (NAMENDA) 10 MG tablet TAKE 2 TABLETS BY MOUTH  DAILY Patient taking differently: Take 20 mg by mouth daily. 10/23/20  Yes [name] R, DO  metFORMIN (GLUCOPHAGE) 500 MG tablet TAKE 1 TABLET BY MOUTH  TWICE DAILY WITH A MEAL Patient taking differently: Take 500 mg by mouth 2 (two) times daily. 10/10/20  Yes [name] R, DO  metoprolol tartrate (LOPRESSOR) 25 MG tablet TAKE 1 TABLET BY MOUTH  TWICE DAILY 01/30/21  Yes [name] R, DO  Multiple Vitamins-Minerals (ONE-A-DAY WEIGHT SMART ADVANCE PO) Take 1 tablet by mouth daily. Centrum Silver   Yes [provider]  nitroGLYCERIN (NITROSTAT) 0.4 MG SL tablet Place 0.4 mg under the tongue every 5 (five) minutes as needed for chest pain.   Yes [provider]  omeprazole (PRILOSEC) 40 MG capsule Take 1 capsule shortly before breakfast and 1 capsule shortly before dinner meal. Patient taking differently: Take 40 mg by mouth 2 (two) times daily. 06/13/20  Yes Milus Banister, MD  potassium chloride SA (KLOR-CON) 20 MEQ tablet TAKE 2 TABLETS BY MOUTH  DAILY Patient taking differently: Take 40 mEq by mouth 2 (two) times daily. 06/13/20  Yes [name] R, DO  SUMAtriptan (IMITREX) 50 MG tablet Take  1 tablet as needed for migraine/vertigo. Do not take more than 3 a week Patient taking differently: Take 50 mg by mouth every 2 (two) hours as needed for migraine. Max 3  tabs per week 04/23/19  Yes Lowne Lyndal Pulley R, DO  topiramate (TOPAMAX) 50 MG tablet Take 1 tablet (50 mg total) by mouth 2 (two) times daily. 06/27/20  Yes Cameron Sprang, MD  verapamil (CALAN-SR) 240 MG CR tablet TAKE 1 TABLET BY MOUTH AT  BEDTIME Patient taking differently: Take 240 mg by mouth at bedtime. 10/23/20  Yes [name] R, DO  vitamin B-12 (CYANOCOBALAMIN) 1000 MCG tablet Take 1,000 mcg by mouth daily.   Yes [provider]  albuterol (PROVENTIL) (2.5 MG/3ML) 0.083% nebulizer solution Take 3 mLs (2.5 mg total) by nebulization every 6 (six) hours as needed for wheezing or shortness of breath. 11/24/19   [name] R, DO  EPINEPHrine 0.3 mg/0.3 mL IJ SOAJ injection Inject 0.3 mg into the muscle as needed for anaphylaxis.     [provider]  ONETOUCH ULTRA test strip USE AS DIRECTED THREE TIMES DAILY. 01/30/21   Carollee Herter, Yvonne R, DO  PROAIR HFA 108 [phone] Base) MCG/ACT inhaler Inhale 1 puff into the lungs every 6 (six) hours as needed for wheezing or shortness of breath. Patient taking differently: Inhale 1-2 puffs into the lungs every 6 (six) hours as needed for wheezing or shortness of breath. 06/28/20   Lowne Chase, Yvonne R, DO  XARELTO 20 MG TABS tablet TAKE 1 TABLET BY MOUTH  DAILY WITH SUPPER Patient taking differently: No sig reported 01/30/21   Tanda Rockers, MD     Critical care time: 40 minutes       CRITICAL CARE Performed by: Otilio Carpen Windle Huebert   Total critical care time: 40 minutes  Critical care time was exclusive of separately billable procedures and treating other patients.  Critical care was necessary to treat or prevent imminent or life-threatening deterioration.  Critical care was time spent personally by me on the following activities: development of  treatment plan with patient and/or surrogate as well as nursing, discussions with consultants, evaluation of patient's response to treatment, examination of patient, obtaining history from patient or surrogate, ordering and performing treatments and interventions, ordering and review of laboratory studies, ordering and review of radiographic studies, pulse oximetry and re-evaluation of patient's condition.   Otilio Carpen Paras Kreider, PA-C [facility] See Amion for pager If no response to pager , please call [phone] until 7pm After 7:00 pm call Elink  [phone]

## 2021-02-22 NOTE — Progress Notes (Signed)
Spoke to patient's wife Bethena Roys with updates and change in patient condition, will notify with new bed number once patient transferred to progressive.

## 2021-02-22 NOTE — Progress Notes (Signed)
   02/22/21 0327  Vitals  Temp (!) 102.7 F (39.3 C)  Temp Source Rectal  BP (!) 173/94  MAP (mmHg) 119  BP Location Right Arm  BP Method Automatic  Patient Position (if appropriate) Lying  Pulse Rate (!) 120  Pulse Rate Source Dinamap  Resp (!) 25  Level of Consciousness  Level of Consciousness Alert  MEWS COLOR  MEWS Score Color Red  Oxygen Therapy  SpO2 92 %  O2 Device Non-rebreather Mask  Patient Activity (if Appropriate) In bed  Pulse Oximetry Type Continuous  Complaints & Interventions  Complains of Coughing;Shortness of breath;Restless  PCA/Epidural/Spinal Assessment  Respiratory Pattern Labored;Accessory muscle use;Dyspnea with exertion  MEWS Score  MEWS Temp 2  MEWS Systolic 0  MEWS Pulse 2  MEWS RR 1  MEWS LOC 0  MEWS Score 5  Provider Notification  Provider Name/Title Dr Bobbye Morton  Date Provider Notified 02/22/21  Time Provider Notified 0327  Notification Type Call  Notification Reason Change in status  Provider response See new orders  Date of Provider Response 02/22/21  Time of Provider Response 0327  Rapid Response Notification  Name of Rapid Response RN Notified Mindy,RN  Date Rapid Response Notified 02/22/21  Note  Observations rapid nurse at bedside

## 2021-02-22 NOTE — Progress Notes (Signed)
Patient assisted to back to bed after bathroom with nurse tech, upon checking on him he started shaking and stating " im so cold, and can't breath." Vitals taken, and patient has a new onset temp of 101 oral and low spo2 in the low 80's. Oxygen placed per [place] with not much increase in level, patient then switched to non rebreather, and oxygen increased to 90-92%.  Charge nurse called in for assistance, and rapid response nurse notified. [phone] rapid response nurse at bedside for assistance. Also notified Dr Bobbye Morton with new orders given and placed.

## 2021-02-22 NOTE — Progress Notes (Signed)
Dr Bobbye Morton at bedside to see patient.

## 2021-02-22 NOTE — Significant Event (Addendum)
Rapid Response Event Note   Reason for Call :  SpO2-82%, AMS after walking to bathroom  Initial Focused Assessment:  Pt lying in bed thrashing. He is HOH. He is moving everything equally. He knows his name but will not answers any questions, just keeps saying he can't breath. He has chills/rigors. Lungs CTA, decreased in the bases. Abd distended but soft. Skin gray, hot to touch.  T-102.7(R), HR-118, BP-150/75, RR-25, SpO2-100% on NRB.   Interventions:  NRB>HFNC PCXR-1. Cardiomegaly with vascular congestion and possible trace to small bilateral pleural effusions. 2. Streaky bibasilar and retrocardiac airspace opacities, progressed from prior study. Findings are favored to represent atelectasis or scarring with a developing infiltrate not excluded. ABG-7.32/39.2/70.3/19.4 CBC/BMP/LA/BC/U/A  Ativan 0.5mg  IV x 1 20mg  Lasix IV 1000mg  tylenol IV Plan of Care:  Pt less agitated after ativan. Give lasix/tylenol and monitor response. Await lab results. Continue to monitor pt closely. May titrate oxygen if SpO2 allows. Please call RRT if further assistance needed.   Event Summary:   MD Notified: Dr. Bobbye Morton notified by bedside RN Call [place] [phone]  Dillard Essex, RN

## 2021-02-22 NOTE — Progress Notes (Signed)
2 Days Post-Op   Subjective/Chief Complaint: Pt with some hypooxygenation overnight as per RN notes Requiring O2 this AM Trx to ICU pending   Objective: Vital signs in last 24 hours: Temp:  [98.3 F (36.8 C)-102.7 F (39.3 C)] 98.4 F (36.9 C) (03/03 0620) Pulse Rate:  [74-123] 97 (03/03 0620) Resp:  [16-25] 22 (03/03 0620) BP: (118-189)/(61-94) 118/66 (03/03 0620) SpO2:  [82 %-95 %] 92 % (03/03 0620) Last BM Date: 02/21/21  Intake/Output from previous day: 03/02 0701 - 03/03 0700 In: 240 [P.O.:240] Out: 900 [Urine:900] Intake/Output this shift: No intake/output data recorded.  PE:  Constitutional: No acute distress, conversant, appears states age. Eyes: Anicteric sclerae, moist conjunctiva, no lid lag Lungs: Coarse BS bilat CV: regular rate and rhythm, no murmurs, no peripheral edema, pedal pulses 2+ GI: Soft, no masses or hepatosplenomegaly, non-tender to palpation, inc c/d/i Skin: No rashes, palpation reveals normal turgor Psychiatric: appropriate judgment and insight, oriented to person, place, and time   Lab Results:  Recent Labs    02/20/21 0547 02/22/21 0403  WBC 8.2 6.3  HGB 12.0* 11.2*  HCT 38.4* 35.5*  PLT 292 270   BMET Recent Labs    02/21/21 0133 02/22/21 0403  NA 145 144  K 4.2 3.4*  CL 113* 114*  CO2 19* 18*  GLUCOSE 144* 197*  BUN 15 13  CREATININE 1.18 1.24  CALCIUM 9.0 8.8*   PT/INR No results for input(s): LABPROT, INR in the last 72 hours. ABG Recent Labs    02/22/21 0340  PHART 7.328*  HCO3 19.4*    Studies/Results: DG Chest Port 1 View  Result Date: 02/22/2021 CLINICAL DATA:  Rapid response.  Acute respiratory distress. EXAM: PORTABLE CHEST 1 VIEW COMPARISON:  09/02/2020 FINDINGS: There is stable elevation of the right hemidiaphragm. The cardiac silhouette is significantly enlarged. There are streaky bibasilar and retrocardiac airspace opacities which are somewhat similar to prior study but appear to have progressed.  There is vascular congestion without overt pulmonary edema. There is no pneumothorax. Trace to small bilateral pleural effusions are suspected. There is no acute osseous abnormality. IMPRESSION: 1. Cardiomegaly with vascular congestion and possible trace to small bilateral pleural effusions. 2. Streaky bibasilar and retrocardiac airspace opacities, progressed from prior study. Findings are favored to represent atelectasis or scarring with a developing infiltrate not excluded. Electronically Signed   By: Constance Holster M.D.   On: 02/22/2021 03:50   DG ESOPHAGUS W SINGLE CM (SOL OR THIN BA)  Result Date: 02/21/2021 CLINICAL DATA:  Postop day 1 for Nissen fundoplication. EXAM: ESOPHOGRAM/BARIUM SWALLOW TECHNIQUE: Single contrast examination was performed using 100 cc of Omnipaque 300. FLUOROSCOPY TIME:  Fluoroscopy Time:  3 minutes and 6 seconds Radiation Exposure Index (if provided by the fluoroscopic device): Number of Acquired Spot Images: 0 COMPARISON:  None. FINDINGS: Focused, single-contrast exam performed with the patient in LPO and RPO position. Especially in LPO position, there is delayed passage of contrast across the operative site, presumably secondary to edema. Expected narrowing at the fundoplication site. No contrast extravasation to suggest perforation. IMPRESSION: Expected appearance after decent fundoplication. No evidence of postoperative leak. Electronically Signed   By: Abigail Miyamoto M.D.   On: 02/21/2021 10:01    Anti-infectives: Anti-infectives (From admission, onward)   Start     Dose/Rate Route Frequency Ordered Stop   02/20/21 0719  ceFAZolin (ANCEF) 2-4 GM/100ML-% IVPB       Note to Pharmacy: Laurita Quint   : cabinet override  02/20/21 0719 02/20/21 1929      Assessment/Plan: s/p Procedure(s): XI ROBOTIC ASSISTED HIATAL HERNIA REPAIR WITH LYSIS OF ADHESIONS AND NISSEN FUNDOPLICATION (N/A) XI ROBOTIC ASSISTED LAPAROSCOPIC NISSEN FUNDOPLICATION (N/A) LAPAROSCOPIC  LYSIS OF ADHESIONS (N/A) INSERTION OF MESH (N/A) CXR pending this AM  Will plan on Trx to ICU Will consult CCM to see if anything can be added from Pulm standpoint. Pt's wife and son in the room and updated with plans and results up to this time.  LOS: 2 days    Ralene Ok 02/22/2021

## 2021-02-23 ENCOUNTER — Encounter: Payer: Medicare Other | Admitting: Family Medicine

## 2021-02-23 ENCOUNTER — Inpatient Hospital Stay (HOSPITAL_COMMUNITY): Payer: Medicare Other

## 2021-02-23 DIAGNOSIS — J9601 Acute respiratory failure with hypoxia: Secondary | ICD-10-CM | POA: Diagnosis not present

## 2021-02-23 DIAGNOSIS — J189 Pneumonia, unspecified organism: Secondary | ICD-10-CM | POA: Diagnosis not present

## 2021-02-23 LAB — BASIC METABOLIC PANEL
Anion gap: 9 (ref 5–15)
Anion gap: 7 (ref 5–15)
BUN: 15 mg/dL (ref 8–23)
BUN: 12 mg/dL (ref 8–23)
CO2: 19 mmol/L — ABNORMAL LOW (ref 22–32)
CO2: 18 mmol/L — ABNORMAL LOW (ref 22–32)
Calcium: 8.6 mg/dL — ABNORMAL LOW (ref 8.9–10.3)
Calcium: 8.5 mg/dL — ABNORMAL LOW (ref 8.9–10.3)
Chloride: 114 mmol/L — ABNORMAL HIGH (ref 98–111)
Chloride: 118 mmol/L — ABNORMAL HIGH (ref 98–111)
Creatinine, Ser: 1.1 mg/dL (ref 0.61–1.24)
Creatinine, Ser: 1.05 mg/dL (ref 0.61–1.24)
GFR, Estimated: >60 mL/min (ref >60)
GFR, Estimated: >60 mL/min (ref >60)
Glucose, Bld: 171 mg/dL — ABNORMAL HIGH (ref 70–99)
Glucose, Bld: 148 mg/dL — ABNORMAL HIGH (ref 70–99)
Potassium: 2.9 mmol/L — ABNORMAL LOW (ref 3.5–5.1)
Potassium: 3.3 mmol/L — ABNORMAL LOW (ref 3.5–5.1)
Sodium: 142 mmol/L (ref 135–145)
Sodium: 143 mmol/L (ref 135–145)

## 2021-02-23 LAB — CBC
HCT: 32 % — ABNORMAL LOW (ref 39.0–52.0)
Hemoglobin: 9.9 g/dL — ABNORMAL LOW (ref 13.0–17.0)
MCH: 28.2 pg (ref 26.0–34.0)
MCHC: 30.9 g/dL (ref 30.0–36.0)
MCV: 91.2 fL (ref 80.0–100.0)
Platelets: 231 10*3/uL (ref 150–400)
RBC: 3.51 MIL/uL — ABNORMAL LOW (ref 4.22–5.81)
RDW: 14.6 % (ref 11.5–15.5)
WBC: 9.5 10*3/uL (ref 4.0–10.5)
nRBC: 0 % (ref 0.0–0.2)

## 2021-02-23 LAB — URINE CULTURE: Culture: NO GROWTH

## 2021-02-23 LAB — TROPONIN I (HIGH SENSITIVITY): Troponin I (High Sensitivity): 15 ng/L (ref <18)

## 2021-02-23 LAB — LACTIC ACID, PLASMA: Lactic Acid, Venous: 1.4 mmol/L (ref 0.5–1.9)

## 2021-02-23 LAB — GLUCOSE, CAPILLARY
Glucose-Capillary: 143 mg/dL — ABNORMAL HIGH (ref 70–99)
Glucose-Capillary: 119 mg/dL — ABNORMAL HIGH (ref 70–99)
Glucose-Capillary: 139 mg/dL — ABNORMAL HIGH (ref 70–99)
Glucose-Capillary: 134 mg/dL — ABNORMAL HIGH (ref 70–99)

## 2021-02-23 LAB — MAGNESIUM: Magnesium: 1.9 mg/dL (ref 1.7–2.4)

## 2021-02-23 MED ORDER — POTASSIUM CHLORIDE CRYS ER 20 MEQ PO TBCR
40.0000 meq | EXTENDED_RELEASE_TABLET | Freq: Once | ORAL | Status: AC
  Administered 2021-02-23: 40 meq via ORAL
  Filled 2021-02-23: qty 2

## 2021-02-23 MED ORDER — ALUM & MAG HYDROXIDE-SIMETH 200-200-20 MG/5ML PO SUSP
30.0000 mL | Freq: Four times a day (QID) | ORAL | Status: DC | PRN
  Administered 2021-02-23 – 2021-02-26 (×4): 30 mL via ORAL
  Filled 2021-02-23 (×4): qty 30

## 2021-02-23 MED ORDER — FUROSEMIDE 40 MG PO TABS: 20.0000 mg | ORAL_TABLET | Freq: Every day | ORAL | Status: DC

## 2021-02-23 MED ORDER — SODIUM CHLORIDE 0.9 % IV SOLN
3.0000 g | Freq: Four times a day (QID) | INTRAVENOUS | Status: DC
  Administered 2021-02-23 – 2021-02-27 (×17): 3 g via INTRAVENOUS
  Filled 2021-02-23 (×6): qty 8
  Filled 2021-02-23: qty 3
  Filled 2021-02-23 (×2): qty 8
  Filled 2021-02-23 (×2): qty 3
  Filled 2021-02-23 (×4): qty 8
  Filled 2021-02-23: qty 3
  Filled 2021-02-23 (×3): qty 8
  Filled 2021-02-23 (×2): qty 3

## 2021-02-23 MED ORDER — MAGNESIUM SULFATE IN D5W 1-5 GM/100ML-% IV SOLN
1.0000 g | Freq: Once | INTRAVENOUS | Status: DC
  Filled 2021-02-23: qty 100

## 2021-02-23 MED ORDER — POTASSIUM CHLORIDE 10 MEQ/100ML IV SOLN
10.0000 meq | INTRAVENOUS | Status: DC
  Administered 2021-02-23 (×2): 10 meq via INTRAVENOUS
  Filled 2021-02-23 (×2): qty 100

## 2021-02-23 MED ORDER — POTASSIUM CHLORIDE CRYS ER 20 MEQ PO TBCR: 40.0000 meq | EXTENDED_RELEASE_TABLET | Freq: Once | ORAL | Status: DC

## 2021-02-23 NOTE — Progress Notes (Signed)
3 Days Post-Op   Subjective/Chief Complaint: Pt doing well this AM Breathing a little easier he states 6L [place] tol clears  Objective: Vital signs in last 24 hours: Temp:  [97.8 F (36.6 C)-101.6 F (38.7 C)] 99.2 F (37.3 C) (03/04 0338) Pulse Rate:  [75-99] 78 (03/04 0600) Resp:  [18-33] 26 (03/04 0600) BP: (103-143)/(58-82) 139/82 (03/04 0600) SpO2:  [85 %-100 %] 99 % (03/04 0600) FiO2 (%):  [100 %] 100 % (03/03 1119) Last BM Date:  (PTA)  Intake/Output from previous day: 03/03 0701 - 03/04 0700 In: 960.6 [P.O.:100; I.V.:0.5; IV Piggyback:800.1] Out: 1900 [Urine:1900] Intake/Output this shift: Total I/O In: 450 [P.O.:100; Other:50; IV Piggyback:300] Out: 850 [Urine:850]  General appearance: alert and cooperative GI: soft, non-tender; bowel sounds normal; no masses,  no organomegaly and inc c/d/i  Lab Results:  Recent Labs    02/22/21 0403 02/23/21 0202  WBC 6.3 9.5  HGB 11.2* 9.9*  HCT 35.5* 32.0*  PLT 270 231   BMET Recent Labs    02/22/21 0403 02/23/21 0202  NA 144 142  K 3.4* 2.9*  CL 114* 114*  CO2 18* 19*  GLUCOSE 197* 171*  BUN 13 15  CREATININE 1.24 1.10  CALCIUM 8.8* 8.6*   PT/INR No results for input(s): LABPROT, INR in the last 72 hours. ABG Recent Labs    02/22/21 0340  PHART 7.328*  HCO3 19.4*    Studies/Results: DG CHEST PORT 1 VIEW  Result Date: 02/22/2021 CLINICAL DATA:  Shortness of breath. EXAM: PORTABLE CHEST 1 VIEW COMPARISON:  Same day. FINDINGS: Stable cardiomediastinal silhouette. No pneumothorax is noted. Mild left basilar atelectasis or infiltrate is noted. Increased right upper lobe opacity is noted concerning for pneumonia or atelectasis. Bony thorax is unremarkable. IMPRESSION: Mild left basilar atelectasis or infiltrate is noted. Increased right upper lobe opacity is noted concerning for pneumonia or atelectasis. Electronically Signed   By: Marijo Conception M.D.   On: 02/22/2021 09:50   DG Chest Port 1 View  Result  Date: 02/22/2021 CLINICAL DATA:  Rapid response.  Acute respiratory distress. EXAM: PORTABLE CHEST 1 VIEW COMPARISON:  09/02/2020 FINDINGS: There is stable elevation of the right hemidiaphragm. The cardiac silhouette is significantly enlarged. There are streaky bibasilar and retrocardiac airspace opacities which are somewhat similar to prior study but appear to have progressed. There is vascular congestion without overt pulmonary edema. There is no pneumothorax. Trace to small bilateral pleural effusions are suspected. There is no acute osseous abnormality. IMPRESSION: 1. Cardiomegaly with vascular congestion and possible trace to small bilateral pleural effusions. 2. Streaky bibasilar and retrocardiac airspace opacities, progressed from prior study. Findings are favored to represent atelectasis or scarring with a developing infiltrate not excluded. Electronically Signed   By: Constance Holster M.D.   On: 02/22/2021 03:50   DG ESOPHAGUS W SINGLE CM (SOL OR THIN BA)  Result Date: 02/21/2021 CLINICAL DATA:  Postop day 1 for Nissen fundoplication. EXAM: ESOPHOGRAM/BARIUM SWALLOW TECHNIQUE: Single contrast examination was performed using 100 cc of Omnipaque 300. FLUOROSCOPY TIME:  Fluoroscopy Time:  3 minutes and 6 seconds Radiation Exposure Index (if provided by the fluoroscopic device): Number of Acquired Spot Images: 0 COMPARISON:  None. FINDINGS: Focused, single-contrast exam performed with the patient in LPO and RPO position. Especially in LPO position, there is delayed passage of contrast across the operative site, presumably secondary to edema. Expected narrowing at the fundoplication site. No contrast extravasation to suggest perforation. IMPRESSION: Expected appearance after decent fundoplication. No evidence of postoperative  leak. Electronically Signed   By: Abigail Miyamoto M.D.   On: 02/21/2021 10:01    Anti-infectives: Anti-infectives (From admission, onward)   Start     Dose/Rate Route Frequency  Ordered Stop   02/22/21 0945  ceFEPIme (MAXIPIME) 2 g in sodium chloride 0.9 % 100 mL IVPB        2 g 200 mL/hr over 30 Minutes Intravenous Every 8 hours 02/22/21 0850     02/20/21 0719  ceFAZolin (ANCEF) 2-4 GM/100ML-% IVPB       Note to Pharmacy: Laurita Quint   : cabinet override      02/20/21 0719 02/20/21 1929      Assessment/Plan: s/p Procedure(s): XI ROBOTIC ASSISTED HIATAL HERNIA REPAIR WITH LYSIS OF ADHESIONS AND NISSEN FUNDOPLICATION (N/A) XI ROBOTIC ASSISTED LAPAROSCOPIC NISSEN FUNDOPLICATION (N/A) LAPAROSCOPIC LYSIS OF ADHESIONS (N/A) INSERTION OF MESH (N/A)  POD #3 Advance diet to full liquids.  Will only go home on pureed con't pulm toilet Appreciate CCM assistance May be OK for floor/PCU today if OK with CCM K+ replacement Mobilize    LOS: 3 days    Ralene Ok 02/23/2021

## 2021-02-23 NOTE — Progress Notes (Signed)
NAME:  Garrett [name], MRN:  [id number], DOB:  Jun 17, 1951, LOS: 3 ADMISSION DATE:  02/20/2021, CONSULTATION DATE:  02/23/21 REFERRING MD:  [name], CHIEF COMPLAINT:  Shortness of breath   Brief History:  70 y.o. M with PMH of Asthma, HFpEF, DM, PE on xarelto, HTN, OSA, who was admitted 3/1 for operative repair of recurrent hiatal hernia.  He was doing well post-op until he had acute onset of fever, shortness of breath and hypoxia in the early morning hours of 3/3 and required non-rebreather mask.  Transferred to ICU.  History of Present Illness:  Garrett [name] is a 70 y.o. M with PMH of Asthma, HFpEF, DM, PE on xarelto, HTN, OSA, who was admitted 3/1 for operative repair of recurrent hiatal hernia.   He underwent robotic hiatal hernia repair with lysis of adhesions and Nissen Fundoplification without complication on 3/1 and was recovering well until he got up to use the restroom in the early morning hours of 3/3 and had acute onset of hypoxia and AMS.  He also developed a fever of 102.45F with progressed bibasilar opacities on CXR.   He was given Ativan and later Romazicon and required 15L non-rebreather mask.   ABG 7.3/39/70/19.   He was transferred to intensive care and remained stable on 15L, though with significant work of breathing.   Past Medical History:   has a past medical history of Allergy, Arthritis, Asthma, Cataract, Cellulitis (2013), CHF (congestive heart failure) ([place]), Colon polyps, Diabetes mellitus, Dyspnea, GERD (gastroesophageal reflux disease), Gout, Heart murmur, History of hiatal hernia, History of pulmonary embolus (PE), HOH (hard of hearing), Hypertension, Memory loss, short term ('07), Migraine headache without aura, Pneumonia, Pulmonary embolism ([place]), Skin cancer, Sleep apnea, and Sty, external (06/2019).   Significant Hospital Events:  3/1 admit and operative hernia repair  Consults:  PCCM  Procedures:    Significant Diagnostic Tests:  3/3 CXR>>1. Cardiomegaly with  vascular congestion and possible trace to small bilateral pleural effusions. 2. Streaky bibasilar and retrocardiac airspace opacities, progressed from prior study. Findings are favored to represent atelectasis or scarring with a developing infiltrate not excluded.  3/4 CXR  Increasing right-sided infiltrates with persistent left basilar infiltrate identified Personally reviewed  Micro Data:  2/26 Covid-19, Flu>>negative 3/1 BCx2>> 3/1 sputum cx>>  Antimicrobials:   Cefepime 3/1-  Interim History / Subjective:  States he is feeling better. Now on 4 L [place] with sats of 100%, Resp. Regular and RR is 28. K is 2.9 Mag is 1.9 Net negative 67 cc's ( 1900 cc's UO last 24) WBC 9.5 ( up from 6.3) HGB 9.9 ( Down from 11.2) Creatinine 1.10  Objective   Blood pressure (!) 145/75, pulse 76, temperature 98.7 F (37.1 C), temperature source Oral, resp. rate (!) 25, height 5\' 9"  (1.753 m), weight 101 kg, SpO2 100 %.    FiO2 (%):  [100 %] 100 %   Intake/Output Summary (Last 24 hours) at 02/23/2021 0823 Last data filed at 02/23/2021 0603 Gross per 24 hour  Intake 960.56 ml  Output 1900 ml  Net -939.44 ml   Filed Weights   02/20/21 0545 02/20/21 0922  Weight: 56.7 kg 101 kg   General:  Elderly M, well-nourished, awake OOB in chair, in NAD on 4 L HEENT: MM pink/moist, No LAD, No JVD Neuro: awake, alert and oriented, though slow to answer questions , hard of hearing CV: s1s2 rrr, no m/r/g, per tele SR PULM:  Remains tachypnaic, adequate oxygen saturations on 4L [place] with decreased  BS  Bilaterally per  Bases, No significant wheezing or rhonchi GI: soft, bsx4 active , Body mass index is 32.88 kg/m. Extremities: warm/dry, no edema  Skin: no rashes or lesions   Resolved Hospital Problem list     Assessment & Plan:   Acute hypoxic respiratory failure, likely secondary to pneumonia in the setting of  Recent hiatal hernia repair with hx of HFpEF,  PE on Xarelto, Cough variant Asthma and OSA   -Pt reports vomiting post-op, possible aspiration -Lactic 2.3, no hypotension, no leukocytosis initially -Has history of PE, Xarelto held peri-operatively, however on POCUS no clear evidence of RH strain -Last formal Echo 12/2018 with EF 55-60% and grade 1 diastolic dysfunction  P: -Monitor respiratory status closely, at risk for intubation, continue oxygen support to maintain sats >92% -Cefepime,( Started 3/3)  respiratory cultures ( Not yet obtained) -received a total of 40mg  Lasix 3/3, monitor UOP -Resume Xarelto, low suspicion for acute PE -Check Troponin and BNP -prn duonebs, on symbicort start [facility] inpatient, follows with Dr. Melvyn Novas for Asthma -qhs CPAP - OOB to chair - Aggressive Pulmonary Toilet ( IS, Flutter) - Swallow eval   Hypokalemia Mag 1.9 Plan Replete K Additional 1 gram Mag 3/4 Trend BMET and Mag Replete electrolytes as needed   BNP 149.2 3/3 HS Troponin 24 Plan Will stop NS at 100 Will add home lasix at 20 mg daily   on  3/5 if  is creatinine and BP stable Trend BNP, and does not look dry Trend Troponin 12 Lead EKG  Elevated Lactate 3/3 Plan Will check lactate to ensure clearance   Suspect Aspiration Event CXR with evolving R sided pneumonia Plan Swallow Eval Meds in applesauce until passes swallow    Best practice (evaluated daily)  Diet: NPO Pain/Anxiety/Delirium protocol (if indicated): n/a VAP protocol (if indicated): n/a DVT prophylaxis: xarelto GI prophylaxis: n/a Glucose control: SSI Mobility: bed rest Disposition:Progressive care  Goals of Care:  Last date of multidisciplinary goals of care discussion: Family and staff present:  Summary of discussion:  Follow up goals of care discussion due: 3/10 Code Status: Family updated at the bedside, continue full code and aggressive care   Pt has improved, will transfer to Progressive care for continue monitoring of respiratory status .  Labs   CBC: Recent Labs  Lab 02/20/21 0547  02/22/21 0403 02/23/21 0202  WBC 8.2 6.3 9.5  HGB 12.0* 11.2* 9.9*  HCT 38.4* 35.5* 32.0*  MCV 91.0 92.0 91.2  PLT 292 270 893    Basic Metabolic Panel: Recent Labs  Lab 02/20/21 0547 02/21/21 0133 02/22/21 0403 02/23/21 0202  NA 142 145 144 142  K 3.4* 4.2 3.4* 2.9*  CL 108 113* 114* 114*  CO2 22 19* 18* 19*  GLUCOSE 150* 144* 197* 171*  BUN 18 15 13 15   CREATININE 1.40* 1.18 1.24 1.10  CALCIUM 9.9 9.0 8.8* 8.6*  MG  --   --   --  1.9   GFR: Estimated Creatinine Clearance: 73.2 mL/min (by C-G formula based on SCr of 1.1 mg/dL). Recent Labs  Lab 02/20/21 0547 02/22/21 0403 02/23/21 0202  WBC 8.2 6.3 9.5  LATICACIDVEN  --  2.3*  --     Liver Function Tests: Recent Labs  Lab 02/20/21 0547  AST 29  ALT 49*  ALKPHOS 41  BILITOT 0.6  PROT 6.7  ALBUMIN 3.6   No results for input(s): LIPASE, AMYLASE in the last 168 hours. No results for input(s): AMMONIA in the last 168  hours.  ABG    Component Value Date/Time   PHART 7.328 (L) 02/22/2021 0340   PCO2ART 39.2 02/22/2021 0340   PO2ART 70.3 (L) 02/22/2021 0340   HCO3 19.4 (L) 02/22/2021 0340   ACIDBASEDEF 5.1 (H) 02/22/2021 0340   O2SAT 89.7 02/22/2021 0340     Coagulation Profile: No results for input(s): INR, PROTIME in the last 168 hours.  Cardiac Enzymes: No results for input(s): CKTOTAL, CKMB, CKMBINDEX, TROPONINI in the last 168 hours.  HbA1C: Hgb A1c MFr Bld  Date/Time Value Ref Range Status  02/22/2021 04:03 AM 6.8 (H) 4.8 - 5.6 % Final    Comment:    (NOTE) Pre diabetes:          5.7%-6.4%  Diabetes:              >6.4%  Glycemic control for   <7.0% adults with diabetes   11/15/2020 08:09 AM 6.7 (H) 4.6 - 6.5 % Final    Comment:    Glycemic Control Guidelines for People with Diabetes:Non Diabetic:  <6%Goal of Therapy: <7%Additional Action Suggested:  >8%     CBG: Recent Labs  Lab 02/22/21 1156 02/22/21 1511 02/22/21 1826 02/22/21 1909 02/23/21 0801  GLUCAP 147* 146* 181*  186* 143*     Surgical History:   Past Surgical History:  Procedure Laterality Date  . ANTERIOR CERVICAL DECOMP/DISCECTOMY FUSION N/A 02/25/2014   Procedure: ANTERIOR CERVICAL DECOMPRESSION/DISCECTOMY FUSION 1 LEVEL five/six;  Surgeon: [name], MD;  Location: [place] [facility];  Service: Neurosurgery;  Laterality: N/A;  . CARDIAC CATHETERIZATION  '94   radial artery approach; normal coronaries 1994 ([facility])  . CATARACT EXTRACTION     Bil/ 2 weeks ago  . COLONOSCOPY  09/26/2015  . colonoscopy with polypectomy  2013  . EYE SURGERY     muscle in left eye  . HIATAL HERNIA REPAIR     done three times: '82 and 04  . incision and drain  '03   staph infection right elbow - required open surgery  . INSERTION OF MESH N/A 02/20/2021   Procedure: INSERTION OF MESH;  Surgeon: Ralene Ok, MD;  Location: [place];  Service: General;  Laterality: N/A;  . LAPAROSCOPIC LYSIS OF ADHESIONS N/A 02/20/2021   Procedure: LAPAROSCOPIC LYSIS OF ADHESIONS;  Surgeon: Ralene Ok, MD;  Location: [place];  Service: General;  Laterality: N/A;  . LUMBAR LAMINECTOMY/DECOMPRESSION MICRODISCECTOMY Right 02/25/2014   Procedure: LUMBAR LAMINECTOMY/DECOMPRESSION MICRODISCECTOMY 1 LEVEL four/five;  Surgeon: [name], MD;  Location: [place] [facility];  Service: Neurosurgery;  Laterality: Right;  . MAXIMUM ACCESS (MAS)POSTERIOR LUMBAR INTERBODY FUSION (PLIF) 1 LEVEL N/A 11/14/2014   Procedure: Lumbar two-three Maximum Access Surgery Posterior Lumbar Interbody Fusion;  Surgeon: [name], MD;  Location: [place] [facility] OR[facility];  Service: Neurosurgery;  Laterality: N/A;  . MYRINGOTOMY     several occasions '02-'03 for dizziness  . ORIF [place]   jumping off a wall  . STRABISMUS SURGERY  1994   left eye  . UPPER GASTROINTESTINAL ENDOSCOPY  06/02/2020   numerous in past  . VASECTOMY    . XI ROBOTIC ASSISTED HIATAL HERNIA REPAIR N/A 02/20/2021   Procedure: XI ROBOTIC ASSISTED HIATAL HERNIA REPAIR WITH LYSIS OF  ADHESIONS AND NISSEN FUNDOPLICATION;  Surgeon: Ralene Ok, MD;  Location: [place];  Service: General;  Laterality: N/A;       Allergies Allergies  Allergen Reactions  . Bee Venom Anaphylaxis  . Garlic Swelling  Home Medications  Prior to Admission medications   Medication Sig Start Date End Date Taking? Authorizing Provider  allopurinol (ZYLOPRIM) 100 MG tablet TAKE 1 TABLET BY MOUTH  DAILY Patient taking differently: Take 100 mg by mouth daily. 06/13/20  Yes [name] R, DO  atorvastatin (LIPITOR) 40 MG tablet Take 40 mg by mouth every evening. 09/08/20  Yes [provider]  budesonide-formoterol (SYMBICORT) 80-4.5 MCG/ACT inhaler Inhale 2 puffs into the lungs daily. 06/03/20  Yes [provider]  celecoxib (CELEBREX) 200 MG capsule TAKE 1 CAPSULE BY MOUTH  TWICE DAILY Patient taking differently: Take 400 mg by mouth daily. 09/12/20  Yes [name] R, DO  famotidine (PEPCID) 20 MG tablet Take 1 tablet (20 mg total) by mouth at bedtime. 04/19/20  Yes Milus Banister, MD  fenofibrate 160 MG tablet TAKE 1 TABLET BY MOUTH  DAILY 01/30/21  Yes Carollee Herter, Yvonne R, DO  fluticasone (FLONASE) 50 MCG/ACT nasal spray Place 2 sprays into both nostrils daily. Patient taking differently: Place 2 sprays into both nostrils daily as needed for allergies. 09/10/19  Yes [name] R, DO  furosemide (LASIX) 40 MG tablet Take 40 mg by mouth daily.   Yes [provider]  gabapentin (NEURONTIN) 100 MG capsule TAKE 2 CAPSULES BY MOUTH AT BEDTIME Patient taking differently: Take 200 mg by mouth at bedtime. 05/08/20  Yes Lyndal Pulley, DO  HYDROcodone-acetaminophen (HYCET) 7.5-325 mg/15 ml solution Take 15 mLs by mouth 4 (four) times daily as needed for moderate pain. 02/21/21 02/21/22 Yes Ralene Ok, MD  levocetirizine (XYZAL) 5 MG tablet Take 1 tablet (5 mg total) by mouth every evening. 10/24/20  Yes [name] R, DO  magnesium oxide  (MAG-OX) 400 (241.3 Mg) MG tablet Take 1 tablet (400 mg total) by mouth daily. 03/03/17  Yes Reyne Dumas, MD  meclizine (ANTIVERT) 25 MG tablet Take 1 tablet (25 mg total) by mouth 3 (three) times daily as needed for dizziness. Patient taking differently: Take 25 mg by mouth as needed for dizziness. 02/14/20  Yes [name] R, DO  memantine (NAMENDA) 10 MG tablet TAKE 2 TABLETS BY MOUTH  DAILY Patient taking differently: Take 20 mg by mouth daily. 10/23/20  Yes [name] R, DO  metFORMIN (GLUCOPHAGE) 500 MG tablet TAKE 1 TABLET BY MOUTH  TWICE DAILY WITH A MEAL Patient taking differently: Take 500 mg by mouth 2 (two) times daily. 10/10/20  Yes [name] R, DO  metoprolol tartrate (LOPRESSOR) 25 MG tablet TAKE 1 TABLET BY MOUTH  TWICE DAILY 01/30/21  Yes [name] R, DO  Multiple Vitamins-Minerals (ONE-A-DAY WEIGHT SMART ADVANCE PO) Take 1 tablet by mouth daily. Centrum Silver   Yes [provider]  nitroGLYCERIN (NITROSTAT) 0.4 MG SL tablet Place 0.4 mg under the tongue every 5 (five) minutes as needed for chest pain.   Yes [provider]  omeprazole (PRILOSEC) 40 MG capsule Take 1 capsule shortly before breakfast and 1 capsule shortly before dinner meal. Patient taking differently: Take 40 mg by mouth 2 (two) times daily. 06/13/20  Yes Milus Banister, MD  potassium chloride SA (KLOR-CON) 20 MEQ tablet TAKE 2 TABLETS BY MOUTH  DAILY Patient taking differently: Take 40 mEq by mouth 2 (two) times daily. 06/13/20  Yes [name] R, DO  SUMAtriptan (IMITREX) 50 MG tablet Take 1 tablet as needed for migraine/vertigo. Do not take more than 3 a week Patient taking differently: Take  50 mg by mouth every 2 (two) hours as needed for migraine. Max 3 tabs per week 04/23/19  Yes Lowne Lyndal Pulley R, DO  topiramate (TOPAMAX) 50 MG tablet Take 1 tablet (50 mg total) by mouth 2 (two) times daily. 06/27/20  Yes Cameron Sprang, MD  verapamil (CALAN-SR)  240 MG CR tablet TAKE 1 TABLET BY MOUTH AT  BEDTIME Patient taking differently: Take 240 mg by mouth at bedtime. 10/23/20  Yes [name] R, DO  vitamin B-12 (CYANOCOBALAMIN) 1000 MCG tablet Take 1,000 mcg by mouth daily.   Yes [provider]  albuterol (PROVENTIL) (2.5 MG/3ML) 0.083% nebulizer solution Take 3 mLs (2.5 mg total) by nebulization every 6 (six) hours as needed for wheezing or shortness of breath. 11/24/19   [name] R, DO  EPINEPHrine 0.3 mg/0.3 mL IJ SOAJ injection Inject 0.3 mg into the muscle as needed for anaphylaxis.     [provider]  ONETOUCH ULTRA test strip USE AS DIRECTED THREE TIMES DAILY. 01/30/21   Carollee Herter, Yvonne R, DO  PROAIR HFA 108 [id number] Base) MCG/ACT inhaler Inhale 1 puff into the lungs every 6 (six) hours as needed for wheezing or shortness of breath. Patient taking differently: Inhale 1-2 puffs into the lungs every 6 (six) hours as needed for wheezing or shortness of breath. 06/28/20   Lowne Chase, Yvonne R, DO  XARELTO 20 MG TABS tablet TAKE 1 TABLET BY MOUTH  DAILY WITH SUPPER Patient taking differently: No sig reported 01/30/21   Tanda Rockers, MD     Critical care time: 40 minutes       CRITICAL CARE Performed by: Magdalen Spatz   Total critical care time: 35 minutes  Critical care time was exclusive of separately billable procedures and treating other patients.  Critical care was necessary to treat or prevent imminent or life-threatening deterioration.  Critical care was time spent personally by me on the following activities: development of treatment plan with patient and/or surrogate as well as nursing, discussions with consultants, evaluation of patient's response to treatment, examination of patient, obtaining history from patient or surrogate, ordering and performing treatments and interventions, ordering and review of laboratory studies, ordering and review of radiographic studies, pulse oximetry and  re-evaluation of patient's condition.   Magdalen Spatz, MSN, AGACNP-BC [place] for personal pager If no response to pager , please call [phone] until 7pm After 7:00 pm call Elink  [phone]

## 2021-02-23 NOTE — Progress Notes (Signed)
Pt refused CPAP tonight. He said it does not make any difference and he is doing fine without it .

## 2021-02-23 NOTE — Progress Notes (Signed)
[facility] ADULT ICU REPLACEMENT PROTOCOL   The patient does apply for the [facility] Adult ICU Electrolyte Replacment Protocol based on the criteria listed below:   1. Is GFR >/= 30 ml/min? Yes.    Patient's GFR today is >60 2. Is SCr </= 2? Yes.   Patient's SCr is 1.1 ml/kg/hr 3. Did SCr increase >/= 0.5 in 24 hours? No. 4. Abnormal electrolyte(s): K 2.9 5. Ordered repletion with: protocol 6. If a panic level lab has been reported, has the CCM MD in charge been notified? No..   Physician:    Ronda Fairly A 02/23/2021 5:51 AM

## 2021-02-23 NOTE — Progress Notes (Signed)
Pharmacy Antibiotic Note  Garrett [name] is a 69 y.o. male admitted on 02/20/2021 with recurrent hiatal hernia. Patient hypoxic overnight and transferred to ICU. Pharmacy had been consulted for cefepime dosing for pneumonia now changing to Unasyn. Wbc wnl. Tmax 101.6. Chest xray favoring atelectasis/scarring with developing infiltrate not excluded. CrCl ~73 ml.min.   Plan: Start Unasyn 3g q6h  Monitor renal function, cultures, and clinical progression  Treat for 7 days with transition to Augmentin in future  Height: 5\' 9"  (175.3 cm) Weight: 101 kg (222 lb 10.6 oz) IBW/kg (Calculated) : 70.7  Temp (24hrs), Avg:99.2 F (37.3 C), Min:97.8 F (36.6 C), Max:101.6 F (38.7 C)  Recent Labs  Lab 02/20/21 0547 02/21/21 0133 02/22/21 0403 02/23/21 0202  WBC 8.2  --  6.3 9.5  CREATININE 1.40* 1.18 1.24 1.10  LATICACIDVEN  --   --  2.3*  --     Estimated Creatinine Clearance: 73.2 mL/min (by C-G formula based on SCr of 1.1 mg/dL).    Allergies  Allergen Reactions  . Bee Venom Anaphylaxis  . Garlic Swelling    Antimicrobials this admission: Cefazolin x1 3/1  Cefepime 3/3 >> 3/4  Unasyn 3/4 >>  Dose adjustments this admission: N/a  Microbiology results: 3/3 ucx: neg 3/3 bcx: ngtd 3/3 resp cx: ordered MRSA PCR neg   Thank you for allowing pharmacy to be a part of this patient's care.  Cristela Felt, PharmD Clinical Pharmacist  02/23/2021 8:59 AM

## 2021-02-24 LAB — COMPREHENSIVE METABOLIC PANEL
ALT: 57 U/L — ABNORMAL HIGH (ref 0–44)
AST: 48 U/L — ABNORMAL HIGH (ref 15–41)
Albumin: 2.5 g/dL — ABNORMAL LOW (ref 3.5–5.0)
Alkaline Phosphatase: 42 U/L (ref 38–126)
Anion gap: 9 (ref 5–15)
BUN: 13 mg/dL (ref 8–23)
CO2: 18 mmol/L — ABNORMAL LOW (ref 22–32)
Calcium: 8.5 mg/dL — ABNORMAL LOW (ref 8.9–10.3)
Chloride: 116 mmol/L — ABNORMAL HIGH (ref 98–111)
Creatinine, Ser: 1 mg/dL (ref 0.61–1.24)
GFR, Estimated: >60 mL/min (ref >60)
Glucose, Bld: 147 mg/dL — ABNORMAL HIGH (ref 70–99)
Potassium: 3.3 mmol/L — ABNORMAL LOW (ref 3.5–5.1)
Sodium: 143 mmol/L (ref 135–145)
Total Bilirubin: 0.9 mg/dL (ref 0.3–1.2)
Total Protein: 5.9 g/dL — ABNORMAL LOW (ref 6.5–8.1)

## 2021-02-24 LAB — LACTIC ACID, PLASMA: Lactic Acid, Venous: 1 mmol/L (ref 0.5–1.9)

## 2021-02-24 LAB — CBC
HCT: 30.1 % — ABNORMAL LOW (ref 39.0–52.0)
Hemoglobin: 9.6 g/dL — ABNORMAL LOW (ref 13.0–17.0)
MCH: 28.6 pg (ref 26.0–34.0)
MCHC: 31.9 g/dL (ref 30.0–36.0)
MCV: 89.6 fL (ref 80.0–100.0)
Platelets: 257 10*3/uL (ref 150–400)
RBC: 3.36 MIL/uL — ABNORMAL LOW (ref 4.22–5.81)
RDW: 14.4 % (ref 11.5–15.5)
WBC: 10 10*3/uL (ref 4.0–10.5)
nRBC: 0 % (ref 0.0–0.2)

## 2021-02-24 LAB — MAGNESIUM: Magnesium: 2.1 mg/dL (ref 1.7–2.4)

## 2021-02-24 LAB — GLUCOSE, CAPILLARY
Glucose-Capillary: 149 mg/dL — ABNORMAL HIGH (ref 70–99)
Glucose-Capillary: 174 mg/dL — ABNORMAL HIGH (ref 70–99)
Glucose-Capillary: 130 mg/dL — ABNORMAL HIGH (ref 70–99)
Glucose-Capillary: 160 mg/dL — ABNORMAL HIGH (ref 70–99)

## 2021-02-24 LAB — BRAIN NATRIURETIC PEPTIDE: B Natriuretic Peptide: 164.1 pg/mL — ABNORMAL HIGH (ref 0.0–100.0)

## 2021-02-24 LAB — TROPONIN I (HIGH SENSITIVITY): Troponin I (High Sensitivity): 15 ng/L (ref <18)

## 2021-02-24 MED ORDER — POTASSIUM CHLORIDE CRYS ER 20 MEQ PO TBCR
40.0000 meq | EXTENDED_RELEASE_TABLET | Freq: Once | ORAL | Status: AC
  Administered 2021-02-24: 40 meq via ORAL
  Filled 2021-02-24: qty 2

## 2021-02-24 MED ORDER — PANTOPRAZOLE SODIUM 40 MG IV SOLR
40.0000 mg | INTRAVENOUS | Status: DC
  Administered 2021-02-24 – 2021-02-25 (×2): 40 mg via INTRAVENOUS
  Filled 2021-02-24 (×2): qty 40

## 2021-02-24 MED ORDER — SUCRALFATE 1 GM/10ML PO SUSP
1.0000 g | Freq: Three times a day (TID) | ORAL | Status: DC
  Administered 2021-02-24 – 2021-02-27 (×11): 1 g via ORAL
  Filled 2021-02-24 (×12): qty 10

## 2021-02-24 NOTE — Progress Notes (Signed)
4 Days Post-Op  Subjective: No acute complaints. Oxygen down to 2L this morning. Endorses mild SOB on exertion. Febrile overnight to 38.7. CXR yesterday showed pulmonary infiltrates on right. Denies nausea/vomiting, tolerated full liquids, no dysphagia.  Objective: Vital signs in last 24 hours: Temp:  [98 F (36.7 C)-101.6 F (38.7 C)] 101.2 F (38.4 C) (03/05 1604) Pulse Rate:  [62-83] 75 (03/05 1604) Resp:  [18-25] 20 (03/05 1604) BP: (109-159)/(53-80) 129/75 (03/05 1604) SpO2:  [92 %-96 %] 96 % (03/05 1604) Weight:  [100 kg] 100 kg (03/05 0354) Last BM Date: 02/23/21  Intake/Output from previous day: 03/04 0701 - 03/05 0700 In: 337.7 [IV Piggyback:337.7] Out: 850 [Urine:850] Intake/Output this shift: Total I/O In: -  Out: 420 [Urine:300; Emesis/NG output:120]  PE: General: resting comfortably, NAD Neuro: alert and oriented Resp: normal work of breathing on 2L nasal cannula Abdomen: soft, nondistended, nontender  Lab Results:  Recent Labs    02/23/21 0202 02/24/21 0334  WBC 9.5 10.0  HGB 9.9* 9.6*  HCT 32.0* 30.1*  PLT 231 257   BMET Recent Labs    02/23/21 2047 02/24/21 0334  NA 143 143  K 3.3* 3.3*  CL 118* 116*  CO2 18* 18*  GLUCOSE 148* 147*  BUN 12 13  CREATININE 1.05 1.00  CALCIUM 8.5* 8.5*   PT/INR No results for input(s): LABPROT, INR in the last 72 hours. CMP     Component Value Date/Time   NA 143 02/24/2021 0334   NA 146 (H) 12/04/2017 1418   K 3.3 (L) 02/24/2021 0334   K 3.4 12/04/2017 1418   CL 116 (H) 02/24/2021 0334   CL 105 12/04/2017 1418   CO2 18 (L) 02/24/2021 0334   CO2 24 12/04/2017 1418   GLUCOSE 147 (H) 02/24/2021 0334   GLUCOSE 135 (H) 12/04/2017 1418   BUN 13 02/24/2021 0334   BUN 11 12/04/2017 1418   CREATININE 1.00 02/24/2021 0334   CREATININE 1.30 (H) 08/17/2020 0948   CALCIUM 8.5 (L) 02/24/2021 0334   CALCIUM 8.9 12/04/2017 1418   PROT 5.9 (L) 02/24/2021 0334   PROT 6.9 12/04/2017 1418   ALBUMIN 2.5 (L)  02/24/2021 0334   ALBUMIN 3.4 12/04/2017 1418   AST 48 (H) 02/24/2021 0334   AST 27 11/10/2018 0850   ALT 57 (H) 02/24/2021 0334   ALT 49 (H) 11/10/2018 0850   ALT 51 (H) 12/04/2017 1418   ALKPHOS 42 02/24/2021 0334   ALKPHOS 103 (H) 12/04/2017 1418   BILITOT 0.9 02/24/2021 0334   BILITOT 0.6 11/10/2018 0850   GFRNONAA >60 02/24/2021 0334   GFRNONAA >60 11/10/2018 0850   GFRNONAA 69 04/28/2018 1016   GFRAA >60 09/02/2020 0927   GFRAA >60 11/10/2018 0850   GFRAA 80 04/28/2018 1016   Lipase  No results found for: LIPASE     Studies/Results: DG CHEST PORT 1 VIEW  Result Date: 02/23/2021 CLINICAL DATA:  Shortness of breath EXAM: PORTABLE CHEST 1 VIEW COMPARISON:  02/22/2021 FINDINGS: Cardiac shadow is stable. Increasing right-sided airspace opacity is noted. Stable left basilar changes are noted. Postsurgical changes in the cervical spine are seen. IMPRESSION: Increasing right-sided infiltrates with persistent left basilar infiltrate identified. Electronically Signed   By: Inez Catalina M.D.   On: 02/23/2021 07:08    Anti-infectives: Anti-infectives (From admission, onward)   Start     Dose/Rate Route Frequency Ordered Stop   02/23/21 1300  Ampicillin-Sulbactam (UNASYN) 3 g in sodium chloride 0.9 % 100 mL IVPB  3 g 200 mL/hr over 30 Minutes Intravenous Every 6 hours 02/23/21 0903 02/28/21 2359   02/22/21 0945  ceFEPIme (MAXIPIME) 2 g in sodium chloride 0.9 % 100 mL IVPB  Status:  Discontinued        2 g 200 mL/hr over 30 Minutes Intravenous Every 8 hours 02/22/21 0850 02/23/21 0903   02/20/21 0719  ceFAZolin (ANCEF) 2-4 GM/100ML-% IVPB       Note to Pharmacy: Laurita Quint   : cabinet override      02/20/21 0719 02/20/21 1929       Assessment/Plan 70 yo male s/p robotic hiatal hernia repair with Nissen fundoplication. - Continue full liquid diet - On Unasyn for pneumonia - Pulmonary toilet, patient is using IS and flutter valve, continue weaning oxygen as  tolerated - Appreciate CCM assistance    LOS: 4 days    Michaelle Birks, MD [facility] Surgery General, Hepatobiliary and Pancreatic Surgery 02/24/21 4:11 PM

## 2021-02-24 NOTE — Plan of Care (Signed)
  Problem: Education: Goal: Knowledge of General Education information will improve Description: Including pain rating scale, medication(s)/side effects and non-pharmacologic comfort measures Outcome: Progressing   Problem: Health Behavior/Discharge Planning: Goal: Ability to manage health-related needs will improve Outcome: Progressing   Problem: Activity: Goal: Risk for activity intolerance will decrease Outcome: Progressing   Problem: Nutrition: Goal: Adequate nutrition will be maintained Outcome: Progressing   Problem: Coping: Goal: Level of anxiety will decrease Outcome: Progressing   Problem: Pain Managment: Goal: General experience of comfort will improve Outcome: Progressing   Pt had temp overnight, that was resolved with tylenol and the use of his incentive spirometer. Pt is currently sustaining >90% on 3L of O2. Will continue to monitor.

## 2021-02-24 NOTE — Progress Notes (Signed)
Pt refused CPAP

## 2021-02-24 NOTE — Progress Notes (Signed)
Patient requested prn maalox for indigestion. Patient vomited 178ml of clear emesis prior to administration. Patient states he feels better after vomiting and still would like to take maalox for further episodes.

## 2021-02-24 NOTE — Progress Notes (Signed)
Administered prn tylenol at this time for temp of 101.2. will reassess

## 2021-02-24 NOTE — Progress Notes (Addendum)
PROGRESS NOTE    Garrett [name]  GEX:[id number] DOB: 1951/04/02 DOA: 02/20/2021 PCP: Ann Held, DO   Brief Narrative: 70 year old male with asthma HFeEF,DM,PE on Xarelto, OSA, hypertension status post repair or recurrent hiatal hernia on 3/1 under surgery service found to have acute onset of fever shortness of breath and hypoxia during post op stay- needing nonrebreather transferred to ICU 3/3.Patient had vomiting postop of aspiration was suspected. Respiratory status stabilized and transferred to progressive neuro 3/5 TRH requested to follow along, patient is currently under surgery service  Subjective: Seen and examined this morning.  Son is at the bedside.  Patient ambulated in the room and resting on the bedside chair, on 3 L nasal cannula.  Had some cough but denied chest pain nausea vomiting shortness of breath. He had fever overnight 101.6 ON 3L [place]  Assessment & Plan:  Acute hypoxic respiratory failure due to aspiration pneumonia-needing up to 15 L Aspiration pneumonia-chest x-ray with right-sided pneumonia Fever Postop Vomiting Noted fever overnight, denies any vomiting has mild cough.  On oxygen weaned down to 3 L.  Blood pressure stable.  COVID-19 -2/26, blood culture x2  Neg so far from 3/3 Continue on Unasyn-and on discharge can continue Augmentin to complete 7 to 10 days course, monitor CBC, continue to work with incentive spirometry, ambulation and wean oxygen as tolerated.  Swallow eval pending  Hiatal hernia status post robotic repair with lysis of adhesion and Nissen fundoplication on 3/1.  Followed by primary team continue diet as tolerated PT OT pain control  Asthma: No wheezing, continue as needed bronchodilators, Dulera. Lactic acidosis-resolved  HFeEF: Euvolemic.  BNP 164.  Continue metoprolol need to monitor fluid status Hypertension: Blood pressure is controlled on metoprolol.  DM: Blood sugar well controlled on sliding scale insulin.  Hemoglobin  A1c stable 6.8 Recent Labs  Lab 02/23/21 1115 02/23/21 1634 02/23/21 2031 02/24/21 0723 02/24/21 1136  GLUCAP 119* 139* 134* 149* 174*   PE on Xarelto cont same  OSA-continue CPAP  Hypokalemia/hypomagnesemia: K3.3 monitor and replete  Morbid obesity with BMI 32 would benefit with weight loss outpatient PCP follow-up  Diet Order            Diet full liquid Room service appropriate? Yes; Fluid consistency: Thin  Diet effective now           Diet - low sodium heart healthy                Patient's Body mass index is 32.56 kg/m. DVT prophylaxis: Xarelto, SCD Code Status:   Code Status: Full Code  Family Communication: plan of care discussed with patient at bedside.  Status is: Inpatient Remains inpatient appropriate because:IV treatments appropriate due to intensity of illness or inability to take PO and Inpatient level of care appropriate due to severity of illness  Dispo: The patient is from: Home              Anticipated d/c is to: TBD              Patient currently is not medically stable to d/c.   Difficult to place patient No    Unresulted Labs (From admission, onward)          Start     Ordered   02/22/21 0636  Expectorated Sputum Assessment w Gram Stain, Rflx to Resp Cult  Once,   R        02/22/21 0636         Medications reviewed:  Scheduled Meds: . atorvastatin  40 mg Oral QPM  . Chlorhexidine Gluconate Cloth  6 each Topical Daily  . famotidine  20 mg Oral QHS  . insulin aspart  0-15 Units Subcutaneous TID WC  . loratadine  10 mg Oral QPM  . magnesium oxide  400 mg Oral Daily  . metoprolol tartrate  25 mg Oral BID  . mometasone-formoterol  2 puff Inhalation BID  . rivaroxaban  20 mg Oral Q supper  . vitamin B-12  1,000 mcg Oral Daily   Continuous Infusions: . ampicillin-sulbactam (UNASYN) IV 3 g (02/24/21 0604)    Consultants:see note  Procedures:see note  Antimicrobials: Anti-infectives (From admission, onward)   Start     Dose/Rate  Route Frequency Ordered Stop   02/23/21 1300  Ampicillin-Sulbactam (UNASYN) 3 g in sodium chloride 0.9 % 100 mL IVPB        3 g 200 mL/hr over 30 Minutes Intravenous Every 6 hours 02/23/21 0903 02/28/21 2359   02/22/21 0945  ceFEPIme (MAXIPIME) 2 g in sodium chloride 0.9 % 100 mL IVPB  Status:  Discontinued        2 g 200 mL/hr over 30 Minutes Intravenous Every 8 hours 02/22/21 0850 02/23/21 0903   02/20/21 0719  ceFAZolin (ANCEF) 2-4 GM/100ML-% IVPB       Note to Pharmacy: Laurita Quint   : cabinet override      02/20/21 0719 02/20/21 1929     Culture/Microbiology    Component Value Date/Time   SDES URINE, RANDOM 02/22/2021 0645   SPECREQUEST NONE 02/22/2021 0645   CULT  02/22/2021 0645    NO GROWTH Performed at [facility], [place] [address] [place], [place] [address]    REPTSTATUS 02/23/2021 FINAL 02/22/2021 0645    Other culture-see note  Objective: Vitals: Today's Vitals   02/24/21 0354 02/24/21 0723 02/24/21 0800 02/24/21 0947  BP: 127/61 (!) 147/78 (!) 159/80 127/61  Pulse: 65 67 62 78  Resp: 19 20 (!) 25   Temp: 98.5 F (36.9 C) 98.1 F (36.7 C)    TempSrc: Oral Axillary    SpO2: 93% 93% 94%   Weight: 100 kg     Height:      PainSc:   0-No pain     Intake/Output Summary (Last 24 hours) at 02/24/2021 1129 Last data filed at 02/24/2021 0356 Gross per 24 hour  Intake 125.39 ml  Output 700 ml  Net -574.61 ml   Filed Weights   02/20/21 0545 02/20/21 0922 02/24/21 0354  Weight: 56.7 kg 101 kg 100 kg   Weight change:   Intake/Output from previous day: 03/04 0701 - 03/05 0700 In: 337.7 [IV Piggyback:337.7] Out: 850 [Urine:850] Intake/Output this shift: No intake/output data recorded. Filed Weights   02/20/21 0545 02/20/21 0922 02/24/21 0354  Weight: 56.7 kg 101 kg 100 kg    Examination: General exam: AAOx3,NAD, weak appearing. HEENT:Oral mucosa moist, Ear/Nose WNL grossly,dentition normal. Respiratory system: bilaterally diminished,no use of  accessory muscle, non tender. Cardiovascular system: S1 & S2 +, regular, No JVD. Gastrointestinal system: Abdomen soft, NT,ND, BS+. Nervous System:Alert, awake, moving extremities and grossly nonfocal Extremities: No edema, distal peripheral pulses palpable.  Skin: No rashes,no icterus. MSK: Normal muscle bulk,tone, power  Data Reviewed: I have personally reviewed following labs and imaging studies CBC: Recent Labs  Lab 02/20/21 0547 02/22/21 0403 02/23/21 0202 02/24/21 0334  WBC 8.2 6.3 9.5 10.0  HGB 12.0* 11.2* 9.9* 9.6*  HCT 38.4* 35.5* 32.0* 30.1*  MCV 91.0  92.0 91.2 89.6  PLT 292 270 231 220   Basic Metabolic Panel: Recent Labs  Lab 02/21/21 0133 02/22/21 0403 02/23/21 0202 02/23/21 2047 02/24/21 0334  NA 145 144 142 143 143  K 4.2 3.4* 2.9* 3.3* 3.3*  CL 113* 114* 114* 118* 116*  CO2 19* 18* 19* 18* 18*  GLUCOSE 144* 197* 171* 148* 147*  BUN 15 13 15 12 13   CREATININE 1.18 1.24 1.10 1.05 1.00  CALCIUM 9.0 8.8* 8.6* 8.5* 8.5*  MG  --   --  1.9  --  2.1   GFR: Estimated Creatinine Clearance: 80.1 mL/min (by C-G formula based on SCr of 1 mg/dL). Liver Function Tests: Recent Labs  Lab 02/20/21 0547 02/24/21 0334  AST 29 48*  ALT 49* 57*  ALKPHOS 41 42  BILITOT 0.6 0.9  PROT 6.7 5.9*  ALBUMIN 3.6 2.5*   No results for input(s): LIPASE, AMYLASE in the last 168 hours. No results for input(s): AMMONIA in the last 168 hours. Coagulation Profile: No results for input(s): INR, PROTIME in the last 168 hours. Cardiac Enzymes: No results for input(s): CKTOTAL, CKMB, CKMBINDEX, TROPONINI in the last 168 hours. BNP (last 3 results) No results for input(s): PROBNP in the last 8760 hours. HbA1C: Recent Labs    02/22/21 0403  HGBA1C 6.8*   CBG: Recent Labs  Lab 02/23/21 0801 02/23/21 1115 02/23/21 1634 02/23/21 2031 02/24/21 0723  GLUCAP 143* 119* 139* 134* 149*   Lipid Profile: No results for input(s): CHOL, HDL, LDLCALC, TRIG, CHOLHDL, LDLDIRECT in  the last 72 hours. Thyroid Function Tests: No results for input(s): TSH, T4TOTAL, FREET4, T3FREE, THYROIDAB in the last 72 hours. Anemia Panel: No results for input(s): VITAMINB12, FOLATE, FERRITIN, TIBC, IRON, RETICCTPCT in the last 72 hours. Sepsis Labs: Recent Labs  Lab 02/22/21 0403 02/23/21 1057 02/24/21 0445  LATICACIDVEN 2.3* 1.4 1.0    Recent Results (from the past 240 hour(s))  SARS CORONAVIRUS 2 (TAT 6-24 HRS) Nasopharyngeal Nasopharyngeal Swab     Status: None   Collection Time: 02/17/21  9:52 AM   Specimen: Nasopharyngeal Swab  Result Value Ref Range Status   SARS Coronavirus 2 NEGATIVE NEGATIVE Final    Comment: (NOTE) SARS-CoV-2 target nucleic acids are NOT DETECTED.  The SARS-CoV-2 RNA is generally detectable in upper and lower respiratory specimens during the acute phase of infection. Negative results do not preclude SARS-CoV-2 infection, do not rule out co-infections with other pathogens, and should not be used as the sole basis for treatment or other patient management decisions. Negative results must be combined with clinical observations, patient history, and epidemiological information. The expected result is Negative.  Fact Sheet for Patients: SugarRoll.be  Fact Sheet for Healthcare Providers: https://www.woods-mathews.com/  This test is not yet approved or cleared by the [place] FDA and  has been authorized for detection and/or diagnosis of SARS-CoV-2 by FDA under an Emergency Use Authorization (EUA). This EUA will remain  in effect (meaning this test can be used) for the duration of the COVID-19 declaration under Se ction 564(b)(1) of the Act, 21 U.S.C. section 360bbb-3(b)(1), unless the authorization is terminated or revoked sooner.  Performed at [facility], [place] [address] [place], [place] [address]   Culture, blood (routine x 2)     Status: None (Preliminary result)   Collection Time:  02/22/21  4:06 AM   Specimen: BLOOD  Result Value Ref Range Status   Specimen Description BLOOD LEFT ANTECUBITAL  Final   Special Requests  Final    BOTTLES DRAWN AEROBIC ONLY Blood Culture adequate volume   Culture   Final    NO GROWTH 2 DAYS Performed at [facility], [place] [address], [place], [place] [address]    Report Status PENDING  Incomplete  Culture, blood (routine x 2)     Status: None (Preliminary result)   Collection Time: 02/22/21  4:08 AM   Specimen: BLOOD LEFT HAND  Result Value Ref Range Status   Specimen Description BLOOD LEFT HAND  Final   Special Requests   Final    BOTTLES DRAWN AEROBIC ONLY Blood Culture results may not be optimal due to an inadequate volume of blood received in culture bottles   Culture   Final    NO GROWTH 2 DAYS Performed at [facility], [place] [address], [place], [place] [address]    Report Status PENDING  Incomplete  Culture, Urine     Status: None   Collection Time: 02/22/21  6:45 AM   Specimen: Urine, Random  Result Value Ref Range Status   Specimen Description URINE, RANDOM  Final   Special Requests NONE  Final   Culture   Final    NO GROWTH Performed at [facility], [place] [address] [place], [place] [address]    Report Status 02/23/2021 FINAL  Final  MRSA PCR Screening     Status: None   Collection Time: 02/22/21  9:23 AM   Specimen: Nasopharyngeal  Result Value Ref Range Status   MRSA by PCR NEGATIVE NEGATIVE Final    Comment:        The GeneXpert MRSA Assay (FDA approved for NASAL specimens only), is one component of a comprehensive MRSA colonization surveillance program. It is not intended to diagnose MRSA infection nor to guide or monitor treatment for MRSA infections. Performed at [facility], [place] [address] [place], [place] [address]      Radiology Studies: DG CHEST PORT 1 VIEW  Result Date: 02/23/2021 CLINICAL DATA:  Shortness of breath EXAM: PORTABLE CHEST 1 VIEW COMPARISON:   02/22/2021 FINDINGS: Cardiac shadow is stable. Increasing right-sided airspace opacity is noted. Stable left basilar changes are noted. Postsurgical changes in the cervical spine are seen. IMPRESSION: Increasing right-sided infiltrates with persistent left basilar infiltrate identified. Electronically Signed   By: Inez Catalina M.D.   On: 02/23/2021 07:08     LOS: 4 days   Antonieta Pert, MD [facility]  02/24/2021, 11:29 AM
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
## 2021-02-25 ENCOUNTER — Inpatient Hospital Stay (HOSPITAL_COMMUNITY): Payer: Medicare Other

## 2021-02-25 LAB — PROTIME-INR
INR: 1.5 — ABNORMAL HIGH (ref 0.8–1.2)
Prothrombin Time: 17.7 seconds — ABNORMAL HIGH (ref 11.4–15.2)

## 2021-02-25 LAB — CBC
HCT: 29 % — ABNORMAL LOW (ref 39.0–52.0)
Hemoglobin: 9.8 g/dL — ABNORMAL LOW (ref 13.0–17.0)
MCH: 29.7 pg (ref 26.0–34.0)
MCHC: 33.8 g/dL (ref 30.0–36.0)
MCV: 87.9 fL (ref 80.0–100.0)
Platelets: 285 10*3/uL (ref 150–400)
RBC: 3.3 MIL/uL — ABNORMAL LOW (ref 4.22–5.81)
RDW: 14.3 % (ref 11.5–15.5)
WBC: 9.6 10*3/uL (ref 4.0–10.5)
nRBC: 0 % (ref 0.0–0.2)

## 2021-02-25 LAB — COMPREHENSIVE METABOLIC PANEL
ALT: 151 U/L — ABNORMAL HIGH (ref 0–44)
AST: 146 U/L — ABNORMAL HIGH (ref 15–41)
Albumin: 2.4 g/dL — ABNORMAL LOW (ref 3.5–5.0)
Alkaline Phosphatase: 45 U/L (ref 38–126)
Anion gap: 10 (ref 5–15)
BUN: 12 mg/dL (ref 8–23)
CO2: 17 mmol/L — ABNORMAL LOW (ref 22–32)
Calcium: 8.5 mg/dL — ABNORMAL LOW (ref 8.9–10.3)
Chloride: 115 mmol/L — ABNORMAL HIGH (ref 98–111)
Creatinine, Ser: 1 mg/dL (ref 0.61–1.24)
GFR, Estimated: >60 mL/min (ref >60)
Glucose, Bld: 151 mg/dL — ABNORMAL HIGH (ref 70–99)
Potassium: 3.3 mmol/L — ABNORMAL LOW (ref 3.5–5.1)
Sodium: 142 mmol/L (ref 135–145)
Total Bilirubin: 0.7 mg/dL (ref 0.3–1.2)
Total Protein: 5.8 g/dL — ABNORMAL LOW (ref 6.5–8.1)

## 2021-02-25 LAB — GLUCOSE, CAPILLARY
Glucose-Capillary: 149 mg/dL — ABNORMAL HIGH (ref 70–99)
Glucose-Capillary: 154 mg/dL — ABNORMAL HIGH (ref 70–99)
Glucose-Capillary: 123 mg/dL — ABNORMAL HIGH (ref 70–99)
Glucose-Capillary: 144 mg/dL — ABNORMAL HIGH (ref 70–99)

## 2021-02-25 LAB — PROCALCITONIN: Procalcitonin: 4.16 ng/mL

## 2021-02-25 MED ORDER — FUROSEMIDE 10 MG/ML IJ SOLN
40.0000 mg | Freq: Once | INTRAMUSCULAR | Status: AC
  Administered 2021-02-25: 40 mg via INTRAVENOUS
  Filled 2021-02-25: qty 4

## 2021-02-25 MED ORDER — POTASSIUM CHLORIDE CRYS ER 20 MEQ PO TBCR
20.0000 meq | EXTENDED_RELEASE_TABLET | Freq: Once | ORAL | Status: AC
  Administered 2021-02-25: 20 meq via ORAL
  Filled 2021-02-25: qty 1

## 2021-02-25 MED ORDER — HYDROMORPHONE HCL 1 MG/ML IJ SOLN
1.0000 mg | INTRAMUSCULAR | Status: DC | PRN
Start: 2021-02-25 — End: 2021-02-26

## 2021-02-25 MED ORDER — POTASSIUM CHLORIDE CRYS ER 20 MEQ PO TBCR
40.0000 meq | EXTENDED_RELEASE_TABLET | Freq: Once | ORAL | Status: AC
  Administered 2021-02-25: 40 meq via ORAL
  Filled 2021-02-25: qty 2

## 2021-02-25 NOTE — Progress Notes (Signed)
Pt reused CPAP for the past two nights. RT will continue to monitor. No distress he is resting comfortably.

## 2021-02-25 NOTE — Progress Notes (Signed)
PROGRESS NOTE    Garrett [name]  SVX:[id number] DOB: [birth year] DOA: 02/20/2021 PCP: Ann Held, DO   Brief Narrative: 70 year old male with asthma HFeEF,DM,PE on Xarelto, OSA, hypertension status post repair or recurrent hiatal hernia on 3/1 under surgery service found to have acute onset of fever shortness of breath and hypoxia during post op stay- needing nonrebreather transferred to ICU 3/3.Patient had vomiting postop of aspiration was suspected. Respiratory status stabilized and transferred to progressive neuro 3/5 TRH requested to follow along, patient is currently under surgery service  Subjective:  Patient in bed, appears comfortable, denies any headache, no fever, no chest pain or pressure, no shortness of breath , no abdominal pain. No focal weakness.   Assessment & Plan:  Acute hypoxic respiratory failure due to aspiration pneumonia-needing up to 15 L, Aspiration pneumonia-   Improved on Unasyn, speech therapy following, on full liquid diet for now.  Monitor procalcitonin and clinically.  Hiatal hernia status post robotic repair with lysis of adhesion and Nissen fundoplication on 3/1.  Followed by primary team continue diet as tolerated PT OT pain control  Asthma: Continue home medication, has mild wheezing with elevated proBNP, 1 dose of Lasix and monitor  HFeEF: Continue beta blocker, likely acute on chronic diastolic CHF, trial of Lasix and monitor.  Hypertension: Blood pressure is controlled on metoprolol.  PE on Xarelto cont same  OSA-continue CPAP nightly.  Discussed with RT on 02/25/2021  Hypokalemia/hypomagnesemia: K3.3 monitor and replete  Obesity with BMI 32 would benefit with weight loss outpatient PCP follow-up  Rising LFTs.  Symptom-free.  Hold statin, check right upper quadrant ultrasound and monitor.  Inform general surgery on 02/25/2021.  DM2 - ISS  Lab Results  Component Value Date   HGBA1C 6.8 (H) 02/22/2021   CBG (last 3)  Recent  Labs    02/24/21 1650 02/24/21 2129 02/25/21 0738  GLUCAP 130* 160* 149*     Diet Order            Diet full liquid Room service appropriate? Yes; Fluid consistency: Thin  Diet effective now           Diet - low sodium heart healthy                Patient's Body mass index is 32.56 kg/m.   DVT prophylaxis: Xarelto, SCD Code Status:   Code Status: Full Code  Family Communication: plan of care discussed with patient at bedside.  Status is: Inpatient Remains inpatient appropriate because:IV treatments appropriate due to intensity of illness or inability to take PO and Inpatient level of care appropriate due to severity of illness  Dispo: The patient is from: Home              Anticipated d/c is to: TBD              Patient currently is not medically stable to d/c.   Difficult to place patient No    Unresulted Labs (From admission, onward)          Start     Ordered   02/26/21 0500  Magnesium  Daily,   R     Question:  Specimen collection method  Answer:  Lab=Lab collect   02/25/21 0736   02/26/21 0500  C-reactive protein  Daily,   R     Question:  Specimen collection method  Answer:  Lab=Lab collect   02/25/21 0736   02/26/21 0500  Comprehensive metabolic panel  Daily,  R     Question:  Specimen collection method  Answer:  Lab=Lab collect   02/25/21 0736   02/26/21 0500  Brain natriuretic peptide  Daily,   R     Question:  Specimen collection method  Answer:  Lab=Lab collect   02/25/21 0736   02/26/21 0500  CBC with Differential/Platelet  Daily,   R     Question:  Specimen collection method  Answer:  Lab=Lab collect   02/25/21 0736   02/26/21 0500  Procalcitonin  Daily,   R     Question:  Specimen collection method  Answer:  Lab=Lab collect   02/25/21 0736   02/22/21 0636  Expectorated Sputum Assessment w Gram Stain, Rflx to Resp Cult  Once,   R        02/22/21 0636         Medications reviewed:  Scheduled Meds:  . atorvastatin  40 mg Oral QPM  .  Chlorhexidine Gluconate Cloth  6 each Topical Daily  . famotidine  20 mg Oral QHS  . insulin aspart  0-15 Units Subcutaneous TID WC  . loratadine  10 mg Oral QPM  . magnesium oxide  400 mg Oral Daily  . metoprolol tartrate  25 mg Oral BID  . mometasone-formoterol  2 puff Inhalation BID  . pantoprazole (PROTONIX) IV  40 mg Intravenous Q24H  . rivaroxaban  20 mg Oral Q supper  . sucralfate  1 g Oral TID WC & HS  . vitamin B-12  1,000 mcg Oral Daily   Continuous Infusions: . ampicillin-sulbactam (UNASYN) IV 3 g (02/25/21 0602)    Consultants:see note  Procedures:see note  Antimicrobials: Anti-infectives (From admission, onward)   Start     Dose/Rate Route Frequency Ordered Stop   02/23/21 1300  Ampicillin-Sulbactam (UNASYN) 3 g in sodium chloride 0.9 % 100 mL IVPB        3 g 200 mL/hr over 30 Minutes Intravenous Every 6 hours 02/23/21 0903 02/28/21 2359   02/22/21 0945  ceFEPIme (MAXIPIME) 2 g in sodium chloride 0.9 % 100 mL IVPB  Status:  Discontinued        2 g 200 mL/hr over 30 Minutes Intravenous Every 8 hours 02/22/21 0850 02/23/21 0903   02/20/21 0719  ceFAZolin (ANCEF) 2-4 GM/100ML-% IVPB       Note to Pharmacy: Laurita Quint   : cabinet override      02/20/21 0719 02/20/21 1929     Culture/Microbiology    Component Value Date/Time   SDES URINE, RANDOM 02/22/2021 0645   SPECREQUEST NONE 02/22/2021 0645   CULT  02/22/2021 0645    NO GROWTH Performed at [facility], [place] [address] [place], [place] [address]    REPTSTATUS 02/23/2021 FINAL 02/22/2021 0645    Other culture-see note  Objective: Vitals: Today's Vitals   02/25/21 0106 02/25/21 0400 02/25/21 0736 02/25/21 0823  BP: (!) 120/53 136/75 137/75   Pulse: 60 62 62   Resp: (!) 24 20 18    Temp: 99.9 F (37.7 C) 98.4 F (36.9 C) 98.4 F (36.9 C)   TempSrc: Oral Oral Oral   SpO2: 94% 92% 95% 96%  Weight:  100 kg    Height:      PainSc:        Intake/Output Summary (Last 24 hours) at 02/25/2021  1043 Last data filed at 02/25/2021 0602 Gross per 24 hour  Intake --  Output 1120 ml  Net -1120 ml   Filed Weights   02/20/21  0263 02/24/21 0354 02/25/21 0400  Weight: 101 kg 100 kg 100 kg   Weight change: 0 kg  Intake/Output from previous day: 03/05 0701 - 03/06 0700 In: -  Out: 1120 [Urine:1000; Emesis/NG output:120] Intake/Output this shift: No intake/output data recorded. Filed Weights   02/20/21 0922 02/24/21 0354 02/25/21 0400  Weight: 101 kg 100 kg 100 kg    Examination:  Awake Alert, No new F.N deficits, Normal affect [place].AT,PERRAL Supple Neck,No JVD, No cervical lymphadenopathy appriciated.  Symmetrical Chest wall movement, Good air movement bilaterally, CTAB RRR,No Gallops, Rubs or new Murmurs, No Parasternal Heave +ve B.Sounds, Abd Soft, No tenderness, No organomegaly appriciated, No rebound - guarding or rigidity. No Cyanosis, Clubbing or edema, No new Rash or bruise   Data Reviewed: I have personally reviewed following labs and imaging studies  Recent Labs  Lab 02/20/21 0547 02/22/21 0403 02/23/21 0202 02/24/21 0334 02/25/21 0349  WBC 8.2 6.3 9.5 10.0 9.6  HGB 12.0* 11.2* 9.9* 9.6* 9.8*  HCT 38.4* 35.5* 32.0* 30.1* 29.0*  PLT 292 270 231 257 285  MCV 91.0 92.0 91.2 89.6 87.9  MCH 28.4 29.0 28.2 28.6 29.7  MCHC 31.3 31.5 30.9 31.9 33.8  RDW 14.5 14.9 14.6 14.4 14.3    Recent Labs  Lab 02/20/21 0547 02/21/21 0133 02/22/21 0403 02/22/21 0407 02/23/21 0202 02/23/21 1057 02/23/21 2047 02/24/21 0334 02/24/21 0445 02/25/21 0349  NA 142   < > 144  --  142  --  143 143  --  142  K 3.4*   < > 3.4*  --  2.9*  --  3.3* 3.3*  --  3.3*  CL 108   < > 114*  --  114*  --  118* 116*  --  115*  CO2 22   < > 18*  --  19*  --  18* 18*  --  17*  GLUCOSE 150*   < > 197*  --  171*  --  148* 147*  --  151*  BUN 18   < > 13  --  15  --  12 13  --  12  CREATININE 1.40*   < > 1.24  --  1.10  --  1.05 1.00  --  1.00  CALCIUM 9.9   < > 8.8*  --  8.6*  --  8.5* 8.5*   --  8.5*  AST 29  --   --   --   --   --   --  48*  --  146*  ALT 49*  --   --   --   --   --   --  57*  --  151*  ALKPHOS 41  --   --   --   --   --   --  42  --  45  BILITOT 0.6  --   --   --   --   --   --  0.9  --  0.7  ALBUMIN 3.6  --   --   --   --   --   --  2.5*  --  2.4*  MG  --   --   --   --  1.9  --   --  2.1  --   --   PROCALCITON  --   --   --   --   --   --   --   --   --  4.16  LATICACIDVEN  --   --  2.3*  --   --  1.4  --   --  1.0  --   HGBA1C  --   --  6.8*  --   --   --   --   --   --   --   BNP  --   --   --  149.2*  --   --   --  164.1*  --   --    < > = values in this interval not displayed.    Recent Results (from the past 240 hour(s))  SARS CORONAVIRUS 2 (TAT 6-24 HRS) Nasopharyngeal Nasopharyngeal Swab     Status: None   Collection Time: 02/17/21  9:52 AM   Specimen: Nasopharyngeal Swab  Result Value Ref Range Status   SARS Coronavirus 2 NEGATIVE NEGATIVE Final    Comment: (NOTE) SARS-CoV-2 target nucleic acids are NOT DETECTED.  The SARS-CoV-2 RNA is generally detectable in upper and lower respiratory specimens during the acute phase of infection. Negative results do not preclude SARS-CoV-2 infection, do not rule out co-infections with other pathogens, and should not be used as the sole basis for treatment or other patient management decisions. Negative results must be combined with clinical observations, patient history, and epidemiological information. The expected result is Negative.  Fact Sheet for Patients: SugarRoll.be  Fact Sheet for Healthcare Providers: https://www.woods-mathews.com/  This test is not yet approved or cleared by the [place] FDA and  has been authorized for detection and/or diagnosis of SARS-CoV-2 by FDA under an Emergency Use Authorization (EUA). This EUA will remain  in effect (meaning this test can be used) for the duration of the COVID-19 declaration under Se ction 564(b)(1)  of the Act, 21 U.S.C. section 360bbb-3(b)(1), unless the authorization is terminated or revoked sooner.  Performed at [facility], [place] [address], [place], [place] [address]   Culture, blood (routine x 2)     Status: None (Preliminary result)   Collection Time: 02/22/21  4:06 AM   Specimen: BLOOD  Result Value Ref Range Status   Specimen Description BLOOD LEFT ANTECUBITAL  Final   Special Requests   Final    BOTTLES DRAWN AEROBIC ONLY Blood Culture adequate volume   Culture   Final    NO GROWTH 3 DAYS Performed at [facility], [place] [address], [place], [place] [address]    Report Status PENDING  Incomplete  Culture, blood (routine x 2)     Status: None (Preliminary result)   Collection Time: 02/22/21  4:08 AM   Specimen: BLOOD LEFT HAND  Result Value Ref Range Status   Specimen Description BLOOD LEFT HAND  Final   Special Requests   Final    BOTTLES DRAWN AEROBIC ONLY Blood Culture results may not be optimal due to an inadequate volume of blood received in culture bottles   Culture   Final    NO GROWTH 3 DAYS Performed at [facility], [place] [address], [place], [place] [address]    Report Status PENDING  Incomplete  Culture, Urine     Status: None   Collection Time: 02/22/21  6:45 AM   Specimen: Urine, Random  Result Value Ref Range Status   Specimen Description URINE, RANDOM  Final   Special Requests NONE  Final   Culture   Final    NO GROWTH Performed at [facility], [place] [address], [place], [place] [address]    Report Status 02/23/2021 FINAL  Final  MRSA PCR Screening     Status: None   Collection Time: 02/22/21  9:23 AM   Specimen: Nasopharyngeal  Result Value Ref Range Status   MRSA by PCR NEGATIVE NEGATIVE Final    Comment:        The GeneXpert MRSA Assay (FDA approved for NASAL specimens only), is one component of a comprehensive MRSA colonization surveillance program. It is not intended to diagnose MRSA infection nor to guide  or monitor treatment for MRSA infections. Performed at [facility], [place] [address] [place], [place] [address]      Radiology Studies: DG Chest Port 1 View  Result Date: 02/25/2021 CLINICAL DATA:  Nausea.  Shortness of breath. EXAM: PORTABLE CHEST 1 VIEW COMPARISON:  February 23, 2021 FINDINGS: No pneumothorax. Infiltrate on the right is stable. The left lung is clear. No other changes. IMPRESSION: Persistent infiltrate on the right, similar in the interval. No other interval changes. Electronically Signed   By: Dorise Bullion III M.D   On: 02/25/2021 08:53   DG Abd Portable 1V  Result Date: 02/25/2021 CLINICAL DATA:  70 year old male with nausea, shortness of breath. EXAM: PORTABLE ABDOMEN - 1 VIEW COMPARISON:  None. FINDINGS: The bowel gas pattern is normal. No radio-opaque calculi or other significant radiographic abnormality are seen. Status post L3-L4 PSIF. Surgical clips in the left upper quadrant. No acute osseous abnormality. IMPRESSION: Nonobstructive bowel gas pattern. Electronically Signed   By: Ruthann Cancer MD   On: 02/25/2021 08:10     LOS: 5 days   Lala Lund, MD [facility]  02/25/2021, 10:43 AM

## 2021-02-25 NOTE — Plan of Care (Signed)
  Problem: Education: Goal: Knowledge of General Education information will improve Description: Including pain rating scale, medication(s)/side effects and non-pharmacologic comfort measures Outcome: Progressing   Problem: Health Behavior/Discharge Planning: Goal: Ability to manage health-related needs will improve Outcome: Progressing   Problem: Clinical Measurements: Goal: Ability to maintain clinical measurements within normal limits will improve Outcome: Progressing Goal: Diagnostic test results will improve Outcome: Progressing Goal: Respiratory complications will improve Outcome: Progressing   Problem: Activity: Goal: Risk for activity intolerance will decrease Outcome: Progressing   Problem: Nutrition: Goal: Adequate nutrition will be maintained Outcome: Progressing   Problem: Safety: Goal: Ability to remain free from injury will improve Outcome: Progressing   Problem: Skin Integrity: Goal: Risk for impaired skin integrity will decrease Outcome: Progressing   Problem: Respiratory: Goal: Ability to maintain adequate ventilation will improve Outcome: Progressing Goal: Ability to maintain a clear airway will improve Outcome: Progressing  Pt had fever overnight which was resolved with prn medication, will continue to monitor

## 2021-02-25 NOTE — Progress Notes (Signed)
5 Days Post-Op  Subjective: Had an episode of emesis yesterday afternoon. Denies further nausea or vomiting. Tmax of 38.4 overnight. WBC remains normal. Oxygen turned off this morning.  Objective: Vital signs in last 24 hours: Temp:  [98.4 F (36.9 C)-101.2 F (38.4 C)] 98.4 F (36.9 C) (03/06 0736) Pulse Rate:  [60-78] 62 (03/06 0736) Resp:  [18-25] 18 (03/06 0736) BP: (113-137)/(53-80) 137/75 (03/06 0736) SpO2:  [92 %-96 %] 96 % (03/06 0823) Weight:  [100 kg] 100 kg (03/06 0400) Last BM Date: 02/23/21  Intake/Output from previous day: 03/05 0701 - 03/06 0700 In: -  Out: 1120 [Urine:1000; Emesis/NG output:120] Intake/Output this shift: No intake/output data recorded.  PE: General: resting comfortably, NAD Neuro: alert and oriented Resp: normal work of breathing, satting 94% on room air Abdomen: soft, nondistended, nontender, lap incisions c/d/i with no erythema, induration or drainage.  Lab Results:  Recent Labs    02/24/21 0334 02/25/21 0349  WBC 10.0 9.6  HGB 9.6* 9.8*  HCT 30.1* 29.0*  PLT 257 285   BMET Recent Labs    02/24/21 0334 02/25/21 0349  NA 143 142  K 3.3* 3.3*  CL 116* 115*  CO2 18* 17*  GLUCOSE 147* 151*  BUN 13 12  CREATININE 1.00 1.00  CALCIUM 8.5* 8.5*   PT/INR No results for input(s): LABPROT, INR in the last 72 hours. CMP     Component Value Date/Time   NA 142 02/25/2021 0349   NA 146 (H) 12/04/2017 1418   K 3.3 (L) 02/25/2021 0349   K 3.4 12/04/2017 1418   CL 115 (H) 02/25/2021 0349   CL 105 12/04/2017 1418   CO2 17 (L) 02/25/2021 0349   CO2 24 12/04/2017 1418   GLUCOSE 151 (H) 02/25/2021 0349   GLUCOSE 135 (H) 12/04/2017 1418   BUN 12 02/25/2021 0349   BUN 11 12/04/2017 1418   CREATININE 1.00 02/25/2021 0349   CREATININE 1.30 (H) 08/17/2020 0948   CALCIUM 8.5 (L) 02/25/2021 0349   CALCIUM 8.9 12/04/2017 1418   PROT 5.8 (L) 02/25/2021 0349   PROT 6.9 12/04/2017 1418   ALBUMIN 2.4 (L) 02/25/2021 0349   ALBUMIN  3.4 12/04/2017 1418   AST 146 (H) 02/25/2021 0349   AST 27 11/10/2018 0850   ALT 151 (H) 02/25/2021 0349   ALT 49 (H) 11/10/2018 0850   ALT 51 (H) 12/04/2017 1418   ALKPHOS 45 02/25/2021 0349   ALKPHOS 103 (H) 12/04/2017 1418   BILITOT 0.7 02/25/2021 0349   BILITOT 0.6 11/10/2018 0850   GFRNONAA >60 02/25/2021 0349   GFRNONAA >60 11/10/2018 0850   GFRNONAA 69 04/28/2018 1016   GFRAA >60 09/02/2020 0927   GFRAA >60 11/10/2018 0850   GFRAA 80 04/28/2018 1016   Lipase  No results found for: LIPASE     Studies/Results: DG Chest Port 1 View  Result Date: 02/25/2021 CLINICAL DATA:  Nausea.  Shortness of breath. EXAM: PORTABLE CHEST 1 VIEW COMPARISON:  February 23, 2021 FINDINGS: No pneumothorax. Infiltrate on the right is stable. The left lung is clear. No other changes. IMPRESSION: Persistent infiltrate on the right, similar in the interval. No other interval changes. Electronically Signed   By: Dorise Bullion III M.D   On: 02/25/2021 08:53   DG Abd Portable 1V  Result Date: 02/25/2021 CLINICAL DATA:  70 year old male with nausea, shortness of breath. EXAM: PORTABLE ABDOMEN - 1 VIEW COMPARISON:  None. FINDINGS: The bowel gas pattern is normal. No radio-opaque calculi or other  significant radiographic abnormality are seen. Status post L3-L4 PSIF. Surgical clips in the left upper quadrant. No acute osseous abnormality. IMPRESSION: Nonobstructive bowel gas pattern. Electronically Signed   By: Ruthann Cancer MD   On: 02/25/2021 08:10    Anti-infectives: Anti-infectives (From admission, onward)   Start     Dose/Rate Route Frequency Ordered Stop   02/23/21 1300  Ampicillin-Sulbactam (UNASYN) 3 g in sodium chloride 0.9 % 100 mL IVPB        3 g 200 mL/hr over 30 Minutes Intravenous Every 6 hours 02/23/21 0903 02/28/21 2359   02/22/21 0945  ceFEPIme (MAXIPIME) 2 g in sodium chloride 0.9 % 100 mL IVPB  Status:  Discontinued        2 g 200 mL/hr over 30 Minutes Intravenous Every 8 hours 02/22/21  0850 02/23/21 0903   02/20/21 0719  ceFAZolin (ANCEF) 2-4 GM/100ML-% IVPB       Note to Pharmacy: Laurita Quint   : cabinet override      02/20/21 0719 02/20/21 1929       Assessment/Plan 70 yo male s/p robotic hiatal hernia repair with Nissen fundoplication. - Continue full liquid diet. Patient had isolated episode of emesis yesterday, nausea resolved. He reports difficulty with drinking large amounts, I counseled him that some mild dysphagia is normal after Nissen. Encouraged to take smaller sips. - Continue full liquid diet, will be discharged on puree diet - On Unasyn for pneumonia - Pulmonary toilet, patient is using IS and flutter valve, continue weaning oxygen as tolerated - Hospitalist following, appreciate assistance - Dispo: inpatient, potential discharge tomorrow if afebrile and remains off oxygen    LOS: 5 days    Michaelle Birks, MD [facility] Surgery General, Hepatobiliary and Pancreatic Surgery 02/25/21 9:25 AM

## 2021-02-25 NOTE — Evaluation (Signed)
Physical Therapy Evaluation Patient Details Name: Garrett [name] MRN: [id number] DOB: February 17, 1951 Today's Date: 02/25/2021   History of Present Illness  Pt is 70 year old male admitted on 3/1status post repair or recurrent hiatal hernia on 3/1 under surgery service found to have acute onset of fever shortness of breath and hypoxia during post op stay- needing nonrebreather transferred to ICU 3/3.Patient had vomiting postop of aspiration as possibility. PMH asthma HFeEF,DM,PE on Xarelto, OSA, hypertension   Clinical Impression  Pt fully participated in evaluation. Pt became SOB with minimal activity. Pt demonstrating improved gait mechanics and balance with use of RW, pt agreeable to use of RW upon discharge. Pt with drop in O2 during mobility, continue to monitor. Pt demonstrating deficits in balance, strength, coordination, gait, endurance and safety and will benefit from skilled PT to address deficits to maximize independence with functional mobility prior to discharge.     Follow Up Recommendations Home health PT;Supervision for mobility/OOB    Equipment Recommendations       Recommendations for Other Services       Precautions / Restrictions Precautions Precautions: Fall Precaution Comments: monitor O2 Restrictions Weight Bearing Restrictions: No      Mobility  Bed Mobility               General bed mobility comments: pt seen sitting in recliner and returned to recliner; no bed mobility assessed    Transfers Overall transfer level: Needs assistance Equipment used: Rolling walker (2 wheeled) Transfers: Sit to/from Stand Sit to Stand: Min assist            Ambulation/Gait Ambulation/Gait assistance: Min guard;Min assist Gait Distance (Feet): 25 Feet Assistive device: Rolling walker (2 wheeled);None       General Gait Details: ambulated without RW requiring min A with increased reaching out for furniture. performed ambulation with RW with min guard and  improved balance; without RW shuffling gait, improved gait mechanis with use of RW  Stairs            Wheelchair Mobility    Modified Rankin (Stroke Patients Only)       Balance Overall balance assessment: Needs assistance Sitting-balance support: Feet supported;No upper extremity supported Sitting balance-Leahy Scale: Good     Standing balance support: No upper extremity supported Standing balance-Leahy Scale: Poor                               Pertinent Vitals/Pain Pain Assessment: No/denies pain (discomfort when coughing)    Home Living Family/patient expects to be discharged to:: Private residence Living Arrangements: Spouse/significant other Available Help at Discharge: Family;Available 24 hours/day Type of Home: House Home Access: Stairs to enter Entrance Stairs-Rails: Can reach both;Left;Right Entrance Stairs-Number of Steps: 3 Home Layout: One level Home Equipment: Walker - 2 wheels;Walker - 4 wheels;Cane - single point;Shower seat      Prior Function Level of Independence: Independent with assistive device(s)         Comments: uses SPC     Hand Dominance   Dominant Hand: Right    Extremity/Trunk Assessment   Upper Extremity Assessment Upper Extremity Assessment: Defer to OT evaluation    Lower Extremity Assessment Lower Extremity Assessment: Generalized weakness       Communication   Communication: HOH  Cognition Arousal/Alertness: Awake/alert Behavior During Therapy: WFL for tasks assessed/performed Overall Cognitive Status: Within Functional Limits for tasks assessed  General Comments General comments (skin integrity, edema, etc.): O2 dropped poor pleth, continue to monitor during ambulation, pt becomes SOB easily    Exercises     Assessment/Plan    PT Assessment Patient needs continued PT services  PT Problem List Decreased mobility;Decreased  strength;Decreased coordination;Decreased activity tolerance;Decreased balance       PT Treatment Interventions Therapeutic exercise;Gait training;Balance training;Stair training;Functional mobility training;Therapeutic activities;Patient/family education    PT Goals (Current goals can be found in the Care Plan section)  Acute Rehab PT Goals Patient Stated Goal: I want to get stronger PT Goal Formulation: With patient Time For Goal Achievement: 03/11/21 Potential to Achieve Goals: Good    Frequency Min 3X/week   Barriers to discharge        Co-evaluation               AM-PAC PT "6 Clicks" Mobility  Outcome Measure Help needed turning from your back to your side while in a flat bed without using bedrails?: None Help needed moving from lying on your back to sitting on the side of a flat bed without using bedrails?: None Help needed moving to and from a bed to a chair (including a wheelchair)?: A Little Help needed standing up from a chair using your arms (e.g., wheelchair or bedside chair)?: A Little Help needed to walk in hospital room?: A Little Help needed climbing 3-5 steps with a railing? : A Lot 6 Click Score: 19    End of Session Equipment Utilized During Treatment: Gait belt Activity Tolerance: Patient tolerated treatment well Patient left: in chair;with call bell/phone within reach;with chair alarm set;with family/visitor present Nurse Communication: Mobility status PT Visit Diagnosis: Unsteadiness on feet (R26.81);Muscle weakness (generalized) (M62.81);Other abnormalities of gait and mobility (R26.89)    Time: 6659-9357 PT Time Calculation (min) (ACUTE ONLY): 12 min   Charges:   PT Evaluation $PT Eval Low Complexity: [place], DPT Acute Rehabilitation Services [phone]  [name] 02/25/2021, 1:34 PM

## 2021-02-26 ENCOUNTER — Inpatient Hospital Stay (HOSPITAL_COMMUNITY): Payer: Medicare Other

## 2021-02-26 LAB — PROTIME-INR
INR: 2.2 — ABNORMAL HIGH (ref 0.8–1.2)
Prothrombin Time: 23.7 seconds — ABNORMAL HIGH (ref 11.4–15.2)

## 2021-02-26 LAB — CBC WITH DIFFERENTIAL/PLATELET
Abs Immature Granulocytes: 0.13 10*3/uL — ABNORMAL HIGH (ref 0.00–0.07)
Basophils Absolute: 0.1 10*3/uL (ref 0.0–0.1)
Basophils Relative: 1 %
Eosinophils Absolute: 0.3 10*3/uL (ref 0.0–0.5)
Eosinophils Relative: 3 %
HCT: 31.9 % — ABNORMAL LOW (ref 39.0–52.0)
Hemoglobin: 10.8 g/dL — ABNORMAL LOW (ref 13.0–17.0)
Immature Granulocytes: 2 %
Lymphocytes Relative: 16 %
Lymphs Abs: 1.4 10*3/uL (ref 0.7–4.0)
MCH: 29.4 pg (ref 26.0–34.0)
MCHC: 33.9 g/dL (ref 30.0–36.0)
MCV: 86.9 fL (ref 80.0–100.0)
Monocytes Absolute: 0.8 10*3/uL (ref 0.1–1.0)
Monocytes Relative: 9 %
Neutro Abs: 6 10*3/uL (ref 1.7–7.7)
Neutrophils Relative %: 69 %
Platelets: 331 10*3/uL (ref 150–400)
RBC: 3.67 MIL/uL — ABNORMAL LOW (ref 4.22–5.81)
RDW: 14.4 % (ref 11.5–15.5)
WBC: 8.6 10*3/uL (ref 4.0–10.5)
nRBC: 0.3 % — ABNORMAL HIGH (ref 0.0–0.2)

## 2021-02-26 LAB — COMPREHENSIVE METABOLIC PANEL
ALT: 190 U/L — ABNORMAL HIGH (ref 0–44)
AST: 127 U/L — ABNORMAL HIGH (ref 15–41)
Albumin: 2.5 g/dL — ABNORMAL LOW (ref 3.5–5.0)
Alkaline Phosphatase: 51 U/L (ref 38–126)
Anion gap: 11 (ref 5–15)
BUN: 13 mg/dL (ref 8–23)
CO2: 18 mmol/L — ABNORMAL LOW (ref 22–32)
Calcium: 9 mg/dL (ref 8.9–10.3)
Chloride: 112 mmol/L — ABNORMAL HIGH (ref 98–111)
Creatinine, Ser: 1.06 mg/dL (ref 0.61–1.24)
GFR, Estimated: >60 mL/min (ref >60)
Glucose, Bld: 164 mg/dL — ABNORMAL HIGH (ref 70–99)
Potassium: 3.2 mmol/L — ABNORMAL LOW (ref 3.5–5.1)
Sodium: 141 mmol/L (ref 135–145)
Total Bilirubin: 0.8 mg/dL (ref 0.3–1.2)
Total Protein: 6.4 g/dL — ABNORMAL LOW (ref 6.5–8.1)

## 2021-02-26 LAB — MAGNESIUM: Magnesium: 2.1 mg/dL (ref 1.7–2.4)

## 2021-02-26 LAB — GLUCOSE, CAPILLARY
Glucose-Capillary: 168 mg/dL — ABNORMAL HIGH (ref 70–99)
Glucose-Capillary: 175 mg/dL — ABNORMAL HIGH (ref 70–99)
Glucose-Capillary: 172 mg/dL — ABNORMAL HIGH (ref 70–99)
Glucose-Capillary: 154 mg/dL — ABNORMAL HIGH (ref 70–99)

## 2021-02-26 LAB — BRAIN NATRIURETIC PEPTIDE: B Natriuretic Peptide: 123.4 pg/mL — ABNORMAL HIGH (ref 0.0–100.0)

## 2021-02-26 LAB — PROCALCITONIN: Procalcitonin: 3.05 ng/mL

## 2021-02-26 LAB — C-REACTIVE PROTEIN: CRP: 16.9 mg/dL — ABNORMAL HIGH (ref <1.0)

## 2021-02-26 MED ORDER — PANTOPRAZOLE SODIUM 40 MG IV SOLR
40.0000 mg | Freq: Two times a day (BID) | INTRAVENOUS | Status: DC
  Administered 2021-02-26 – 2021-02-27 (×3): 40 mg via INTRAVENOUS
  Filled 2021-02-26 (×3): qty 40

## 2021-02-26 MED ORDER — MEMANTINE HCL 10 MG PO TABS
20.0000 mg | ORAL_TABLET | Freq: Every day | ORAL | Status: DC
Start: 2021-02-26 — End: 2021-02-27
  Administered 2021-02-26 – 2021-02-27 (×2): 20 mg via ORAL
  Filled 2021-02-26 (×2): qty 2

## 2021-02-26 MED ORDER — ALLOPURINOL 100 MG PO TABS
100.0000 mg | ORAL_TABLET | Freq: Every day | ORAL | Status: DC
  Administered 2021-02-26 – 2021-02-27 (×2): 100 mg via ORAL
  Filled 2021-02-26 (×2): qty 1

## 2021-02-26 MED ORDER — AMOXICILLIN-POT CLAVULANATE 875-125 MG PO TABS: 1.0000 | ORAL_TABLET | Freq: Two times a day (BID) | ORAL | 0 refills | Status: DC

## 2021-02-26 MED ORDER — POTASSIUM CHLORIDE CRYS ER 20 MEQ PO TBCR
40.0000 meq | EXTENDED_RELEASE_TABLET | Freq: Once | ORAL | Status: AC
  Administered 2021-02-26: 40 meq via ORAL
  Filled 2021-02-26: qty 2

## 2021-02-26 MED ORDER — FUROSEMIDE 10 MG/ML IJ SOLN
20.0000 mg | Freq: Once | INTRAMUSCULAR | Status: AC
  Administered 2021-02-26: 20 mg via INTRAVENOUS
  Filled 2021-02-26: qty 2

## 2021-02-26 MED ORDER — TOPIRAMATE 25 MG PO TABS
50.0000 mg | ORAL_TABLET | Freq: Two times a day (BID) | ORAL | Status: DC
  Administered 2021-02-26 – 2021-02-27 (×3): 50 mg via ORAL
  Filled 2021-02-26 (×3): qty 2

## 2021-02-26 MED ORDER — FENOFIBRATE 160 MG PO TABS
160.0000 mg | ORAL_TABLET | Freq: Every day | ORAL | Status: DC
Start: 2021-02-26 — End: 2021-02-27
  Administered 2021-02-26 – 2021-02-27 (×2): 160 mg via ORAL
  Filled 2021-02-26 (×2): qty 1

## 2021-02-26 MED ORDER — FUROSEMIDE 10 MG/ML IJ SOLN
40.0000 mg | Freq: Once | INTRAMUSCULAR | Status: AC
  Administered 2021-02-26: 40 mg via INTRAVENOUS
  Filled 2021-02-26: qty 4

## 2021-02-26 MED ORDER — FUROSEMIDE 40 MG PO TABS
40.0000 mg | ORAL_TABLET | Freq: Every day | ORAL | Status: DC
Start: 2021-02-27 — End: 2021-02-26

## 2021-02-26 MED ORDER — HYDROMORPHONE HCL 1 MG/ML IJ SOLN: 0.5000 mg | Freq: Four times a day (QID) | INTRAMUSCULAR | Status: DC | PRN

## 2021-02-26 NOTE — Plan of Care (Signed)
  Problem: Education: Goal: Knowledge of General Education information will improve Description: Including pain rating scale, medication(s)/side effects and non-pharmacologic comfort measures Outcome: Progressing   Problem: Health Behavior/Discharge Planning: Goal: Ability to manage health-related needs will improve Outcome: Progressing   Problem: Clinical Measurements: Goal: Diagnostic test results will improve Outcome: Progressing Goal: Respiratory complications will improve Outcome: Progressing   Problem: Nutrition: Goal: Adequate nutrition will be maintained Outcome: Progressing   Problem: Coping: Goal: Level of anxiety will decrease Outcome: Progressing    Vital signs remained stable overnight, no acute events took place. Will continue to monitor

## 2021-02-26 NOTE — Care Management Important Message (Signed)
Important Message  Patient Details  Name: Garrett [name] MRN: [id number] Date of Birth: 19-Oct-1951   Medicare Important Message Given:  Yes - Important Message mailed due to current National Emergency   Verbal consent obtained due to current National Emergency  Relationship to patient: Self Contact Name: Ronalee Belts Call Date: 02/26/21  Time: 1437 Phone: [phone] Outcome: Spoke with contact Important Message mailed to: Patient address on file    Delorse Lek 02/26/2021, 2:39 PM

## 2021-02-26 NOTE — Progress Notes (Signed)
PROGRESS NOTE    Garrett [name]  MWN:[id number] DOB: 12-Feb-1951 DOA: 02/20/2021 PCP: Ann Held, DO   Brief Narrative: 70 year old male with asthma HFeEF,DM,PE on Xarelto, OSA, hypertension status post repair or recurrent hiatal hernia on 3/1 under surgery service found to have acute onset of fever shortness of breath and hypoxia during post op stay- needing nonrebreather transferred to ICU 3/3.Patient had vomiting postop of aspiration was suspected. Respiratory status stabilized and transferred to progressive neuro 3/5 TRH requested to follow along, patient is currently under surgery service  Subjective:  Patient in bed denies any headache chest or abdominal pain, minimal shortness of breath but much improved.   Assessment & Plan:  Acute hypoxic respiratory failure due to aspiration pneumonia-needing up to 15 L, Aspiration pneumonia -   Improved on Unasyn, speech therapy following, monitor closely with antibiotics, clinically improving continue supportive care with oxygen and nebulizer treatments.  Hiatal hernia status post robotic repair with lysis of adhesion and Nissen fundoplication on 3/1.  Followed by primary team continue diet as tolerated PT OT pain control.  Asthma: Continue home medication, has mild wheezing with elevated proBNP, 1 dose of Lasix and monitor  HFeEF: Continue beta blocker, likely acute on chronic diastolic CHF, improved after 1 dose of Lasix on 02/25/2021 will repeat on 02/27/2019 we will continue to monitor.  Hypertension: Blood pressure is controlled on metoprolol.  PE on Xarelto cont same  OSA-continue CPAP nightly.  Discussed with RT on 02/25/2021, reminded the treatment team again on 02/26/2021 including nurse.  Hypokalemia/hypomagnesemia: Replaced as needed.  Obesity with BMI 32 would benefit with weight loss outpatient PCP follow-up  Rising LFTs.  Symptom-free.  Hold statin, acute right upper quadrant ultrasound showing possible underlying  fatty liver.  Informed general surgery on 02/25/2021.  Trend is improving will continue to monitor.  DM2 - ISS  Lab Results  Component Value Date   HGBA1C 6.8 (H) 02/22/2021   CBG (last 3)  Recent Labs    02/25/21 1604 02/25/21 2303 02/26/21 0738  GLUCAP 123* 144* 168*     Diet Order            Diet full liquid Room service appropriate? Yes; Fluid consistency: Thin  Diet effective now           Diet - low sodium heart healthy                Patient's Body mass index is 32.56 kg/m.   DVT prophylaxis: Xarelto, SCD Code Status:   Code Status: Full Code  Family Communication: plan of care discussed with patient at bedside, per primary team - CCS  Status is: Inpatient Remains inpatient appropriate because:IV treatments appropriate due to intensity of illness or inability to take PO and Inpatient level of care appropriate due to severity of illness  Dispo: The patient is from: Home              Anticipated d/c is to: TBD              Patient currently is not medically stable to d/c.   Difficult to place patient No    Unresulted Labs (From admission, onward)          Start     Ordered   02/26/21 0500  Magnesium  Daily,   R     Question:  Specimen collection method  Answer:  Lab=Lab collect   02/25/21 0736   02/26/21 0500  C-reactive protein  Daily,  R     Question:  Specimen collection method  Answer:  Lab=Lab collect   02/25/21 0736   02/26/21 0500  Comprehensive metabolic panel  Daily,   R     Question:  Specimen collection method  Answer:  Lab=Lab collect   02/25/21 0736   02/26/21 0500  Brain natriuretic peptide  Daily,   R     Question:  Specimen collection method  Answer:  Lab=Lab collect   02/25/21 0736   02/26/21 0500  CBC with Differential/Platelet  Daily,   R     Question:  Specimen collection method  Answer:  Lab=Lab collect   02/25/21 0736   02/26/21 0500  Procalcitonin  Daily,   R     Question:  Specimen collection method  Answer:  Lab=Lab collect    02/25/21 0736   02/26/21 0500  Protime-INR  Daily,   R     Question:  Specimen collection method  Answer:  Lab=Lab collect   02/25/21 1049   02/22/21 0636  Expectorated Sputum Assessment w Gram Stain, Rflx to Resp Cult  Once,   R        02/22/21 0636         Medications reviewed:  Scheduled Meds:  . famotidine  20 mg Oral QHS  . insulin aspart  0-15 Units Subcutaneous TID WC  . loratadine  10 mg Oral QPM  . magnesium oxide  400 mg Oral Daily  . metoprolol tartrate  25 mg Oral BID  . mometasone-formoterol  2 puff Inhalation BID  . pantoprazole (PROTONIX) IV  40 mg Intravenous Q24H  . potassium chloride  40 mEq Oral Once  . rivaroxaban  20 mg Oral Q supper  . sucralfate  1 g Oral TID WC & HS  . vitamin B-12  1,000 mcg Oral Daily   Continuous Infusions: . ampicillin-sulbactam (UNASYN) IV 3 g (02/26/21 0617)    Consultants:see note  Procedures:see note  Antimicrobials: Anti-infectives (From admission, onward)   Start     Dose/Rate Route Frequency Ordered Stop   02/26/21 0000  amoxicillin-clavulanate (AUGMENTIN) 875-125 MG tablet        1 tablet Oral 2 times daily 02/26/21 0652 03/05/21 2359   02/23/21 1300  Ampicillin-Sulbactam (UNASYN) 3 g in sodium chloride 0.9 % 100 mL IVPB        3 g 200 mL/hr over 30 Minutes Intravenous Every 6 hours 02/23/21 0903 02/28/21 2359   02/22/21 0945  ceFEPIme (MAXIPIME) 2 g in sodium chloride 0.9 % 100 mL IVPB  Status:  Discontinued        2 g 200 mL/hr over 30 Minutes Intravenous Every 8 hours 02/22/21 0850 02/23/21 0903   02/20/21 0719  ceFAZolin (ANCEF) 2-4 GM/100ML-% IVPB       Note to Pharmacy: Laurita Quint   : cabinet override      02/20/21 0719 02/20/21 1929     Culture/Microbiology    Component Value Date/Time   SDES URINE, RANDOM 02/22/2021 0645   SPECREQUEST NONE 02/22/2021 0645   CULT  02/22/2021 0645    NO GROWTH Performed at [facility], [place] [address] [place], [place] [address]    REPTSTATUS 02/23/2021  FINAL 02/22/2021 0645    Other culture-see note  Objective: Vitals: Today's Vitals   02/25/21 2104 02/25/21 2351 02/26/21 0506 02/26/21 0734  BP:  134/71 122/63 125/62  Pulse: 65 62 66 79  Resp:  (!) 24 17 18   Temp:  99 F (37.2 C) 97.9  F (36.6 C) 97.7 F (36.5 C)  TempSrc:  Oral Axillary Axillary  SpO2:  90% 93% 95%  Weight:      Height:      PainSc:  0-No pain 0-No pain     Intake/Output Summary (Last 24 hours) at 02/26/2021 0928 Last data filed at 02/26/2021 0617 Gross per 24 hour  Intake 640 ml  Output 500 ml  Net 140 ml   Filed Weights   02/20/21 0922 02/24/21 0354 02/25/21 0400  Weight: 101 kg 100 kg 100 kg   Weight change:   Intake/Output from previous day: 03/06 0701 - 03/07 0700 In: 880 [P.O.:480; IV Piggyback:400] Out: 500 [Urine:500] Intake/Output this shift: No intake/output data recorded. Filed Weights   02/20/21 0922 02/24/21 0354 02/25/21 0400  Weight: 101 kg 100 kg 100 kg    Examination:  Awake Alert, No new F.N deficits, Normal affect [place].AT,PERRAL Supple Neck,No JVD, No cervical lymphadenopathy appriciated.  Symmetrical Chest wall movement, Good air movement bilaterally, mild wheezing RRR,No Gallops, Rubs or new Murmurs, No Parasternal Heave +ve B.Sounds, Abd Soft, No tenderness, No organomegaly appriciated, No rebound - guarding or rigidity. No Cyanosis, Clubbing or edema, No new Rash or bruise    Data Reviewed: I have personally reviewed following labs and imaging studies  Recent Labs  Lab 02/22/21 0403 02/23/21 0202 02/24/21 0334 02/25/21 0349 02/26/21 0125  WBC 6.3 9.5 10.0 9.6 8.6  HGB 11.2* 9.9* 9.6* 9.8* 10.8*  HCT 35.5* 32.0* 30.1* 29.0* 31.9*  PLT 270 231 257 285 331  MCV 92.0 91.2 89.6 87.9 86.9  MCH 29.0 28.2 28.6 29.7 29.4  MCHC 31.5 30.9 31.9 33.8 33.9  RDW 14.9 14.6 14.4 14.3 14.4  LYMPHSABS  --   --   --   --  1.4  MONOABS  --   --   --   --  0.8  EOSABS  --   --   --   --  0.3  BASOSABS  --   --   --   --  0.1     Recent Labs  Lab 02/20/21 0547 02/21/21 0133 02/22/21 0403 02/22/21 0407 02/23/21 0202 02/23/21 1057 02/23/21 2047 02/24/21 0334 02/24/21 0445 02/25/21 0349 02/25/21 1134 02/26/21 0125  NA 142   < > 144  --  142  --  143 143  --  142  --  141  K 3.4*   < > 3.4*  --  2.9*  --  3.3* 3.3*  --  3.3*  --  3.2*  CL 108   < > 114*  --  114*  --  118* 116*  --  115*  --  112*  CO2 22   < > 18*  --  19*  --  18* 18*  --  17*  --  18*  GLUCOSE 150*   < > 197*  --  171*  --  148* 147*  --  151*  --  164*  BUN 18   < > 13  --  15  --  12 13  --  12  --  13  CREATININE 1.40*   < > 1.24  --  1.10  --  1.05 1.00  --  1.00  --  1.06  CALCIUM 9.9   < > 8.8*  --  8.6*  --  8.5* 8.5*  --  8.5*  --  9.0  AST 29  --   --   --   --   --   --  48*  --  146*  --  127*  ALT 49*  --   --   --   --   --   --  57*  --  151*  --  190*  ALKPHOS 41  --   --   --   --   --   --  42  --  45  --  51  BILITOT 0.6  --   --   --   --   --   --  0.9  --  0.7  --  0.8  ALBUMIN 3.6  --   --   --   --   --   --  2.5*  --  2.4*  --  2.5*  MG  --   --   --   --  1.9  --   --  2.1  --   --   --  2.1  CRP  --   --   --   --   --   --   --   --   --   --   --  16.9*  PROCALCITON  --   --   --   --   --   --   --   --   --  4.16  --  3.05  LATICACIDVEN  --   --  2.3*  --   --  1.4  --   --  1.0  --   --   --   INR  --   --   --   --   --   --   --   --   --   --  1.5* 2.2*  HGBA1C  --   --  6.8*  --   --   --   --   --   --   --   --   --   BNP  --   --   --  149.2*  --   --   --  164.1*  --   --   --  123.4*   < > = values in this interval not displayed.    Recent Results (from the past 240 hour(s))  SARS CORONAVIRUS 2 (TAT 6-24 HRS) Nasopharyngeal Nasopharyngeal Swab     Status: None   Collection Time: 02/17/21  9:52 AM   Specimen: Nasopharyngeal Swab  Result Value Ref Range Status   SARS Coronavirus 2 NEGATIVE NEGATIVE Final    Comment: (NOTE) SARS-CoV-2 target nucleic acids are NOT DETECTED.  The SARS-CoV-2  RNA is generally detectable in upper and lower respiratory specimens during the acute phase of infection. Negative results do not preclude SARS-CoV-2 infection, do not rule out co-infections with other pathogens, and should not be used as the sole basis for treatment or other patient management decisions. Negative results must be combined with clinical observations, patient history, and epidemiological information. The expected result is Negative.  Fact Sheet for Patients: SugarRoll.be  Fact Sheet for Healthcare Providers: https://www.woods-mathews.com/  This test is not yet approved or cleared by the [place] FDA and  has been authorized for detection and/or diagnosis of SARS-CoV-2 by FDA under an Emergency Use Authorization (EUA). This EUA will remain  in effect (meaning this test can be used) for the duration of the COVID-19 declaration under Se ction 564(b)(1) of the Act, 21 U.S.C. section 360bbb-3(b)(1), unless the authorization is terminated or revoked sooner.  Performed at [facility], [place] [address], [place], [place] [address]   Culture, blood (routine x 2)     Status: None (Preliminary result)   Collection Time: 02/22/21  4:06 AM   Specimen: BLOOD  Result Value Ref Range Status   Specimen Description BLOOD LEFT ANTECUBITAL  Final   Special Requests   Final    BOTTLES DRAWN AEROBIC ONLY Blood Culture adequate volume   Culture   Final    NO GROWTH 3 DAYS Performed at [facility], [place] [address] [place], [place] [address]    Report Status PENDING  Incomplete  Culture, blood (routine x 2)     Status: None (Preliminary result)   Collection Time: 02/22/21  4:08 AM   Specimen: BLOOD LEFT HAND  Result Value Ref Range Status   Specimen Description BLOOD LEFT HAND  Final   Special Requests   Final    BOTTLES DRAWN AEROBIC ONLY Blood Culture results may not be optimal due to an inadequate volume of blood received in  culture bottles   Culture   Final    NO GROWTH 3 DAYS Performed at [facility], [place] [address] [place], [place] [address]    Report Status PENDING  Incomplete  Culture, Urine     Status: None   Collection Time: 02/22/21  6:45 AM   Specimen: Urine, Random  Result Value Ref Range Status   Specimen Description URINE, RANDOM  Final   Special Requests NONE  Final   Culture   Final    NO GROWTH Performed at [facility], [place] [address], [place], [place] [address]    Report Status 02/23/2021 FINAL  Final  MRSA PCR Screening     Status: None   Collection Time: 02/22/21  9:23 AM   Specimen: Nasopharyngeal  Result Value Ref Range Status   MRSA by PCR NEGATIVE NEGATIVE Final    Comment:        The GeneXpert MRSA Assay (FDA approved for NASAL specimens only), is one component of a comprehensive MRSA colonization surveillance program. It is not intended to diagnose MRSA infection nor to guide or monitor treatment for MRSA infections. Performed at [facility], [place] [address] [place], [place] [address]      Radiology Studies: DG CHEST PORT 1 VIEW  Result Date: 02/26/2021 CLINICAL DATA:  70 year old male with history of shortness of breath. EXAM: PORTABLE CHEST 1 VIEW COMPARISON:  Chest x-ray 02/25/2021. FINDINGS: Lung volumes are low. Patchy multifocal airspace consolidation and areas of interstitial prominence are again noted in the lungs bilaterally, most severe in the right upper lobe. Overall, aeration appears minimally improved compared to the prior examination. Probable trace left pleural effusion. No definite right pleural effusion. No pneumothorax. No definite suspicious appearing pulmonary nodules or masses are noted. No evidence of pulmonary edema. Heart size is normal. Upper mediastinal contours are within normal limits allowing for patient positioning. Orthopedic fixation hardware in the lower cervical spine incidentally noted. IMPRESSION: 1. Low lung volumes  with persistent but improving multilobar bilateral pneumonia, as above. Electronically Signed   By: [name] M.D.   On: 02/26/2021 09:00   DG Chest Port 1 View  Result Date: 02/25/2021 CLINICAL DATA:  Nausea.  Shortness of breath. EXAM: PORTABLE CHEST 1 VIEW COMPARISON:  February 23, 2021 FINDINGS: No pneumothorax. Infiltrate on the right is stable. The left lung is clear. No other changes. IMPRESSION: Persistent infiltrate on the right, similar in the interval. No other  interval changes. Electronically Signed   By: Dorise Bullion III M.D   On: 02/25/2021 08:53   DG Abd Portable 1V  Result Date: 02/25/2021 CLINICAL DATA:  70 year old male with nausea, shortness of breath. EXAM: PORTABLE ABDOMEN - 1 VIEW COMPARISON:  None. FINDINGS: The bowel gas pattern is normal. No radio-opaque calculi or other significant radiographic abnormality are seen. Status post L3-L4 PSIF. Surgical clips in the left upper quadrant. No acute osseous abnormality. IMPRESSION: Nonobstructive bowel gas pattern. Electronically Signed   By: Ruthann Cancer MD   On: 02/25/2021 08:10   US Abdomen Limited RUQ (LIVER/GB)  Result Date: 02/25/2021 CLINICAL DATA:  Transaminitis EXAM: ULTRASOUND ABDOMEN LIMITED RIGHT UPPER QUADRANT COMPARISON:  None. FINDINGS: Gallbladder: Limited visualization with no abnormalities identified. Common bile duct: Diameter: 4.2 mm. Liver: The left hepatic lobe is not seen due to shadowing bowel gas. No focal abnormalities identified in the liver. Increased hepatic echogenicity is noted diffusely. Portal vein is patent on color Doppler imaging with normal direction of blood flow towards the liver. Other: None. IMPRESSION: 1. The study is limited due to the patient's body habitus. There is diffuse increased echogenicity in the liver which is nonspecific but often due to hepatic steatosis. No other abnormalities are noted. Electronically Signed   By: Dorise Bullion III M.D   On: 02/25/2021 14:56     LOS: 6  days   Lala Lund, MD [facility]  02/26/2021, 9:28 AM

## 2021-02-26 NOTE — Progress Notes (Signed)
  Speech Language Pathology Treatment: Dysphagia  Patient Details Name: Garrett [name] MRN: [id number] DOB: 1951-04-04 Today's Date: 02/26/2021 Time: 3354-5625 SLP Time Calculation (min) (ACUTE ONLY): 25 min  Assessment / Plan / Recommendation Clinical Impression  Pt seen for skilled ST intervention targeting goals for diet tolerance and advancement. Pt continues to be on full liquids due to recent hiatal hernia repair. Pt reports early satiety and an ongoing feeling of fullness with minimal PO intake. Pt declined PO presentation this afternoon due to globus sensation. SLP encouraged pt to try a warm liquid prior to meals (hot tea, broth, etc) to facilitate esophageal motility. Pt frequently reports SOB, however, O2 sats stayed between 90% and 93% throughout this session. SLP will continue to follow to assess tolerance of advanced consistencies as surgery team clears pt for more solid textures. RN informed.    HPI HPI: Patient was admitted for elective robotic assisted hiatal hernia repair.  He has a history of several repairs in the past with reported worsening of his reflux lately.  He was pending DC post op day 1 and DC was discontinued as the patient was not tolerating a full liquid diet.  Post op day 2 patient was noted to have AMS and drop in his O2 levels which led to a rapid response being called with patient being transferred to a higher level of care.  He also began having issues with urinary requiring placement of a foley catheter that has since been removed.  He was intubated for approximately 4 hours for surgery.  Most recent chest xray was showing increasing right sided infiltrates with persistent left basilar infiltrate.  Per RN patient is weaning from O2 and is currently on a 2L nasal canula.      SLP Plan  Continue with current plan of care       Recommendations  Diet recommendations:  (advance to regular per surgery) Liquids provided via: Cup;Straw;Teaspoon Medication  Administration: Whole meds with liquid Supervision: Patient able to self feed Compensations: Slow rate;Small sips/bites;Minimize environmental distractions;Other (Comment) (begin meals with warm liquid) Postural Changes and/or Swallow Maneuvers: Seated upright 90 degrees;Upright 30-60 min after meal                Oral Care Recommendations: Oral care BID Follow up Recommendations: Other (comment) (TBD) SLP Visit Diagnosis: Dysphagia, unspecified (R13.10) Plan: Continue with current plan of care       [place]. Quentin Ore, [facility], [place] Speech Language Pathologist Office: [phone]  Shonna Chock 02/26/2021, 3:08 PM

## 2021-02-26 NOTE — Progress Notes (Signed)
6 Days Post-Op   Subjective/Chief Complaint: Pt with some SOB this AM ambulating to the bathroom.  Placed on O2 this AM    Objective: Vital signs in last 24 hours: Temp:  [97.7 F (36.5 C)-99.7 F (37.6 C)] 97.7 F (36.5 C) (03/07 0734) Pulse Rate:  [60-79] 79 (03/07 0734) Resp:  [17-24] 18 (03/07 0734) BP: (115-134)/(60-79) 125/62 (03/07 0734) SpO2:  [90 %-96 %] 95 % (03/07 0734) Last BM Date: 02/24/21  Intake/Output from previous day: 03/06 0701 - 03/07 0700 In: 880 [P.O.:480; IV Piggyback:400] Out: 500 [Urine:500] Intake/Output this shift: No intake/output data recorded.  General appearance: alert and cooperative GI: soft, non-tender; bowel sounds normal; no masses,  no organomegaly and inc c /d/i Pulm: some wheezes bilaterally Lab Results:  Recent Labs    02/25/21 0349 02/26/21 0125  WBC 9.6 8.6  HGB 9.8* 10.8*  HCT 29.0* 31.9*  PLT 285 331   BMET Recent Labs    02/25/21 0349 02/26/21 0125  NA 142 141  K 3.3* 3.2*  CL 115* 112*  CO2 17* 18*  GLUCOSE 151* 164*  BUN 12 13  CREATININE 1.00 1.06  CALCIUM 8.5* 9.0   PT/INR Recent Labs    02/25/21 1134 02/26/21 0125  LABPROT 17.7* 23.7*  INR 1.5* 2.2*   ABG No results for input(s): PHART, HCO3 in the last 72 hours.  Invalid input(s): PCO2, PO2  Studies/Results: DG Chest Port 1 View  Result Date: 02/25/2021 CLINICAL DATA:  Nausea.  Shortness of breath. EXAM: PORTABLE CHEST 1 VIEW COMPARISON:  February 23, 2021 FINDINGS: No pneumothorax. Infiltrate on the right is stable. The left lung is clear. No other changes. IMPRESSION: Persistent infiltrate on the right, similar in the interval. No other interval changes. Electronically Signed   By: Dorise Bullion III M.D   On: 02/25/2021 08:53   DG Abd Portable 1V  Result Date: 02/25/2021 CLINICAL DATA:  70 year old male with nausea, shortness of breath. EXAM: PORTABLE ABDOMEN - 1 VIEW COMPARISON:  None. FINDINGS: The bowel gas pattern is normal. No radio-opaque  calculi or other significant radiographic abnormality are seen. Status post L3-L4 PSIF. Surgical clips in the left upper quadrant. No acute osseous abnormality. IMPRESSION: Nonobstructive bowel gas pattern. Electronically Signed   By: Ruthann Cancer MD   On: 02/25/2021 08:10   US Abdomen Limited RUQ (LIVER/GB)  Result Date: 02/25/2021 CLINICAL DATA:  Transaminitis EXAM: ULTRASOUND ABDOMEN LIMITED RIGHT UPPER QUADRANT COMPARISON:  None. FINDINGS: Gallbladder: Limited visualization with no abnormalities identified. Common bile duct: Diameter: 4.2 mm. Liver: The left hepatic lobe is not seen due to shadowing bowel gas. No focal abnormalities identified in the liver. Increased hepatic echogenicity is noted diffusely. Portal vein is patent on color Doppler imaging with normal direction of blood flow towards the liver. Other: None. IMPRESSION: 1. The study is limited due to the patient's body habitus. There is diffuse increased echogenicity in the liver which is nonspecific but often due to hepatic steatosis. No other abnormalities are noted. Electronically Signed   By: Dorise Bullion III M.D   On: 02/25/2021 14:56    Anti-infectives: Anti-infectives (From admission, onward)   Start     Dose/Rate Route Frequency Ordered Stop   02/26/21 0000  amoxicillin-clavulanate (AUGMENTIN) 875-125 MG tablet        1 tablet Oral 2 times daily 02/26/21 0652 03/05/21 2359   02/23/21 1300  Ampicillin-Sulbactam (UNASYN) 3 g in sodium chloride 0.9 % 100 mL IVPB  3 g 200 mL/hr over 30 Minutes Intravenous Every 6 hours 02/23/21 0903 02/28/21 2359   02/22/21 0945  ceFEPIme (MAXIPIME) 2 g in sodium chloride 0.9 % 100 mL IVPB  Status:  Discontinued        2 g 200 mL/hr over 30 Minutes Intravenous Every 8 hours 02/22/21 0850 02/23/21 0903   02/20/21 0719  ceFAZolin (ANCEF) 2-4 GM/100ML-% IVPB       Note to Pharmacy: Laurita Quint   : cabinet override      02/20/21 0719 02/20/21 1929      Assessment/Plan: s/p  Procedure(s): XI ROBOTIC ASSISTED HIATAL HERNIA REPAIR WITH LYSIS OF ADHESIONS AND NISSEN FUNDOPLICATION (N/A) XI ROBOTIC ASSISTED LAPAROSCOPIC NISSEN FUNDOPLICATION (N/A) LAPAROSCOPIC LYSIS OF ADHESIONS (N/A) INSERTION OF MESH (N/A) -repeat CXR this aM -redose lasix x1 -breathing treatment, pulmonary toilet -home maybe later today vs tomorrow if he improves  LOS: 6 days    Garrett [name] 02/26/2021

## 2021-02-26 NOTE — Discharge Instructions (Signed)
EATING AFTER YOUR ESOPHAGEAL SURGERY (Stomach Fundoplication, Hiatal Hernia repair, Achalasia surgery, etc)  ######################################################################  EAT Start with a pureed / full liquid diet (see below) Gradually transition to a high fiber diet with a fiber supplement over the next month after discharge.    WALK Walk an hour a day.  Control your pain to do that.    CONTROL PAIN Control pain so that you can walk, sleep, tolerate sneezing/coughing, go up/down stairs.  HAVE A BOWEL MOVEMENT DAILY Keep your bowels regular to avoid problems.  OK to try a laxative to override constipation.  OK to use an antidairrheal to slow down diarrhea.  Call if not better after 2 tries  CALL IF YOU HAVE PROBLEMS/CONCERNS Call if you are still struggling despite following these instructions. Call if you have concerns not answered by these instructions  ######################################################################   After your esophageal surgery, expect some sticking with swallowing over the next 1-2 months.    If food sticks when you eat, it is called "dysphagia".  This is due to swelling around your esophagus at the wrap & hiatal diaphragm repair.  It will gradually ease off over the next few months.  To help you through this temporary phase, we start you out on a pureed (blenderized) diet.  Your first meal in the hospital was thin liquids.  You should have been given a pureed diet by the time you left the hospital.  We ask patients to stay on a pureed diet for the first 2-3 weeks to avoid anything getting "stuck" near your recent surgery.  Don't be alarmed if your ability to swallow doesn't progress according to this plan.  Everyone is different and some diets can advance more or less quickly.    It is often helpful to crush your medications or split them as they can sometimes stick, especially the first week or so.   Some BASIC RULES to follow  are:  Maintain an upright position whenever eating or drinking.  Take small bites - just a teaspoon size bite at a time.  Eat slowly.  It may also help to eat only one food at a time.  Consider nibbling through smaller, more frequent meals & avoid the urge to eat BIG meals  Do not push through feelings of fullness, nausea, or bloatedness  Do not mix solid foods and liquids in the same mouthful  Try not to "wash foods down" with large gulps of liquids.  Avoid carbonated (bubbly/fizzy) drinks.    Avoid foods that make you feel gassy or bloated.  Start with bland foods first.  Wait on trying greasy, fried, or spicy meals until you are tolerating more bland solids well.  Understand that it will be hard to burp and belch at first.  This gradually improves with time.  Expect to be more gassy/flatulent/bloated initially.  Walking will help your body manage it better.  Consider using medications for bloating that contain simethicone such as  Maalox or Gas-X   Consider crushing her medications, especially smaller pills.  The ability to swallow pills should get easier after a few weeks  Eat in a relaxed atmosphere & minimize distractions.  Avoid talking while eating.    Do not use straws.  Following each meal, sit in an upright position (90 degree angle) for 60 to 90 minutes.  Going for a short walk can help as well  If food does stick, don't panic.  Try to relax and let the food pass on its own.    Sipping WARM LIQUID such as strong hot black tea can also help slide it down.   Be gradual in changes & use common sense:  -If you easily tolerating a certain "level" of foods, advance to the next level gradually -If you are having trouble swallowing a particular food, then avoid it.   -If food is sticking when you advance your diet, go back to thinner previous diet (the lower LEVEL) for 1-2 days.  LEVEL 1 = PUREED DIET  Do for the first 2 WEEKS AFTER SURGERY  -Foods in this group are  pureed or blenderized to a smooth, mashed potato-like consistency.  -If necessary, the pureed foods can keep their shape with the addition of a thickening agent.   -Meat should be pureed to a smooth, pasty consistency.  Hot broth or gravy may be added to the pureed meat, approximately 1 oz. of liquid per 3 oz. serving of meat. -CAUTION:  If any foods do not puree into a smooth consistency, swallowing will be more difficult.  (For example, nuts or seeds sometimes do not blend well.)  Hot Foods Cold Foods  Pureed scrambled eggs and cheese Pureed cottage cheese  Baby cereals Thickened juices and nectars  Thinned cooked cereals (no lumps) Thickened milk or eggnog  Pureed [place] toast or pancakes Ensure  Mashed potatoes Ice cream  Pureed parsley, au gratin, scalloped potatoes, candied sweet potatoes Fruit or [place] ice, sherbet  Pureed buttered or alfredo noodles Plain yogurt  Pureed vegetables (no corn or peas) Instant breakfast  Pureed soups and creamed soups Smooth pudding, mousse, custard  Pureed scalloped apples Whipped gelatin  Gravies Sugar, syrup, honey, jelly  Sauces, cheese, tomato, barbecue, white, creamed Cream  Any baby food Creamer  Alcohol in moderation (not beer or champagne) Margarine  Coffee or tea Mayonnaise   Ketchup, mustard   Apple sauce   SAMPLE MENU:  PUREED DIET Breakfast Lunch Dinner   Orange juice, 1/2 cup  Cream of wheat, 1/2 cup  Pineapple juice, 1/2 cup  Pureed [place], barley soup, 3/4 cup  Pureed Hawaiian chicken, 3 oz   Scrambled eggs, mashed or blended with cheese, 1/2 cup  Tea or coffee, 1 cup   Whole milk, 1 cup   Non-dairy creamer, 2 Tbsp.  Mashed potatoes, 1/2 cup  Pureed cooled broccoli, 1/2 cup  Apple sauce, 1/2 cup  Coffee or tea  Mashed potatoes, 1/2 cup  Pureed spinach, 1/2 cup  Frozen yogurt, 1/2 cup  Tea or coffee      LEVEL 2 = SOFT DIET  After your first 2 weeks, you can advance to a soft diet.   Keep on this  diet until everything goes down easily.  Hot Foods Cold Foods  White fish Cottage cheese  Stuffed fish Junior baby fruit  Baby food meals Semi thickened juices  Minced soft cooked, scrambled, poached eggs nectars  Souffle & omelets Ripe mashed bananas  Cooked cereals Canned fruit, pineapple sauce, milk  potatoes Milkshake  Buttered or Alfredo noodles Custard  Cooked cooled vegetable Puddings, including tapioca  Sherbet Yogurt  Vegetable soup or alphabet soup Fruit ice, [place] ice  Gravies Whipped gelatin  Sugar, syrup, honey, jelly Junior baby desserts  Sauces:  Cheese, creamed, barbecue, tomato, white Cream  Coffee or tea Margarine   SAMPLE MENU:  LEVEL 2 Breakfast Lunch Dinner   Orange juice, 1/2 cup  Oatmeal, 1/2 cup  Scrambled eggs with cheese, 1/2 cup  Decaffeinated tea, 1 cup  Whole milk, 1 cup  Non-dairy creamer, 2 Tbsp  Pineapple juice, 1/2 cup  Minced beef, 3 oz  Gravy, 2 Tbsp  Mashed potatoes, 1/2 cup  Minced fresh broccoli, 1/2 cup  Applesauce, 1/2 cup  Coffee, 1 cup  [place], barley soup, 3/4 cup  Minced Hawaiian chicken, 3 oz  Mashed potatoes, 1/2 cup  Cooked spinach, 1/2 cup  Frozen yogurt, 1/2 cup  Non-dairy creamer, 2 Tbsp      LEVEL 3 = CHOPPED DIET  -After all the foods in level 2 (soft diet) are passing through well you should advance up to more chopped foods.  -It is still important to cut these foods into small pieces and eat slowly.  Hot Foods Cold Foods  Poultry Cottage cheese  Chopped Swedish meatballs Yogurt  Meat salads (ground or flaked meat) Milk  Flaked fish (tuna) Milkshakes  Poached or scrambled eggs Soft, cold, dry cereal  Souffles and omelets Fruit juices or nectars  Cooked cereals Chopped canned fruit  Chopped French toast or pancakes Canned fruit cocktail  Noodles or pasta (no rice) Pudding, mousse, custard  Cooked vegetables (no frozen peas, corn, or mixed vegetables) Green salad  Canned small sweet peas  Ice cream  Creamed soup or vegetable soup Fruit ice, Italian ice  Pureed vegetable soup or alphabet soup Non-dairy creamer  Ground scalloped apples Margarine  Gravies Mayonnaise  Sauces:  Cheese, creamed, barbecue, tomato, white Ketchup  Coffee or tea Mustard   SAMPLE MENU:  LEVEL 3 Breakfast Lunch Dinner   Orange juice, 1/2 cup  Oatmeal, 1/2 cup  Scrambled eggs with cheese, 1/2 cup  Decaffeinated tea, 1 cup  Whole milk, 1 cup  Non-dairy creamer, 2 Tbsp  Ketchup, 1 Tbsp  Margarine, 1 tsp  Salt, 1/4 tsp  Sugar, 2 tsp  Pineapple juice, 1/2 cup  Ground beef, 3 oz  Gravy, 2 Tbsp  Mashed potatoes, 1/2 cup  Cooked spinach, 1/2 cup  Applesauce, 1/2 cup  Decaffeinated coffee  Whole milk  Non-dairy creamer, 2 Tbsp  Margarine, 1 tsp  Salt, 1/4 tsp  Pureed turkey, barley soup, 3/4 cup  Barbecue chicken, 3 oz  Mashed potatoes, 1/2 cup  Ground fresh broccoli, 1/2 cup  Frozen yogurt, 1/2 cup  Decaffeinated tea, 1 cup  Non-dairy creamer, 2 Tbsp  Margarine, 1 tsp  Salt, 1/4 tsp  Sugar, 1 tsp    LEVEL 4:  REGULAR FOODS  -Foods in this group are soft, moist, regularly textured foods.   -This level includes meat and breads, which tend to be the hardest things to swallow.   -Eat very slowly, chew well and continue to avoid carbonated drinks. -most people are at this level in 4-6 weeks  Hot Foods Cold Foods  Baked fish or skinned Soft cheeses - cottage cheese  Souffles and omelets Cream cheese  Eggs Yogurt  Stuffed shells Milk  Spaghetti with meat sauce Milkshakes  Cooked cereal Cold dry cereals (no nuts, dried fruit, coconut)  French toast or pancakes Crackers  Buttered toast Fruit juices or nectars  Noodles or pasta (no rice) Canned fruit  Potatoes (all types) Ripe bananas  Soft, cooked vegetables (no corn, lima, or baked beans) Peeled, ripe, fresh fruit  Creamed soups or vegetable soup Cakes (no nuts, dried fruit, coconut)  Canned chicken  noodle soup Plain doughnuts  Gravies Ice cream  Bacon dressing Pudding, mousse, custard  Sauces:  Cheese, creamed, barbecue, tomato, white Fruit ice, Italian ice, sherbet  Decaffeinated tea or coffee Whipped gelatin  Pork chops Regular gelatin     Canned fruited gelatin molds   Sugar, syrup, honey, jam, jelly   Cream   Non-dairy   Margarine   Oil   Mayonnaise   Ketchup   Mustard   TROUBLESHOOTING IRREGULAR BOWELS  1) Avoid extremes of bowel movements (no bad constipation/diarrhea)  2) Miralax 17gm mixed in 8oz. water or juice-daily. May use BID as needed.  3) Gas-x,Phazyme, etc. as needed for gas & bloating.  4) Soft,bland diet. No spicy,greasy,fried foods.  5) Prilosec over-the-counter as needed  6) May hold gluten/wheat products from diet to see if symptoms improve.  7) May try probiotics (Align, Activa, etc) to help calm the bowels down  7) If symptoms become worse call back immediately.    If you have any questions please call our office at [place]: [phone].  Information on my medicine - XARELTO (rivaroxaban)  This medication education was reviewed with me or my healthcare representative as part of my discharge preparation.  The pharmacist that spoke with me during my hospital stay was:  Onnie Boer, [place]? Xarelto was prescribed to treat blood clots that may have been found in the veins of your legs (deep vein thrombosis) or in your lungs (pulmonary embolism) and to reduce the risk of them occurring again.  What do you need to know about Xarelto? Continue Xarelto 20mg  ONCE A DAY with your evening meal.  DO NOT stop taking Xarelto without talking to the health care provider who prescribed the medication.  Refill your prescription for 20 mg tablets before you run out.  After discharge, you should have regular check-up appointments with your healthcare provider that is prescribing your Xarelto.  In the future  your dose may need to be changed if your kidney function changes by a significant amount.  What do you do if you miss a dose? If you are taking Xarelto TWICE DAILY and you miss a dose, take it as soon as you remember. You may take two 15 mg tablets (total 30 mg) at the same time then resume your regularly scheduled 15 mg twice daily the next day.  If you are taking Xarelto ONCE DAILY and you miss a dose, take it as soon as you remember on the same day then continue your regularly scheduled once daily regimen the next day. Do not take two doses of Xarelto at the same time.   Important Safety Information Xarelto is a blood thinner medicine that can cause bleeding. You should call your healthcare provider right away if you experience any of the following: ? Bleeding from an injury or your nose that does not stop. ? Unusual colored urine (red or dark brown) or unusual colored stools (red or black). ? Unusual bruising for unknown reasons. ? A serious fall or if you hit your head (even if there is no bleeding).  Some medicines may interact with Xarelto and might increase your risk of bleeding while on Xarelto. To help avoid this, consult your healthcare provider or pharmacist prior to using any new prescription or non-prescription medications, including herbals, vitamins, non-steroidal anti-inflammatory drugs (NSAIDs) and supplements.  This website has more information on Xarelto: [email or web address]

## 2021-02-26 NOTE — Discharge Summary (Addendum)
Physician Discharge Summary  Patient ID: Garrett [name] MRN: [id number] DOB/AGE: [birth year] 71 y.o.  Admit date: 02/20/2021 Discharge date: 02/26/2021  Admission Diagnoses:re-recurrent hiatal hernia  Discharge Diagnoses:  Active Problems:   SOB (shortness of breath) on exertion   Pneumonia of right middle lobe due to infectious organism   S/P Nissen fundoplication (without gastrostomy tube) procedure   Acute respiratory distress   Acute respiratory failure with hypoxia [facility])   Discharged Condition: good  Hospital Course: Pt underwent robotic HHR with mesh and fundoplication.  POD 1 esophagram showed no leak and usual edema postop.  He was started on a liquid diet.  He was tolerating that well.  Post op pt was sent to the floor and did well until the night of POD 2.  He was acutely hypoxic and appeared to have aspirated.  He was placed on suppl O2 and transferred to the ICU.  He was seen by CCM and underwent diuretic treatment and placed on abx for aspiration.  O2 was titrated down and he con't to do wel land trasnferred from the ICU to the floor.  He was weaned off O2.  He con't to tol PO well.  He was otherwise AF, deemed stable for DC and [place] home.  Consults: CCM  Significant Diagnostic Studies: esophagram with no leak, CXR with aspiration and LL consolidation  Treatments: surgery: as above; Abx for aspiration  Discharge Exam: Blood pressure 122/63, pulse 66, temperature 97.9 F (36.6 C), temperature source Axillary, resp. rate 17, height 5\' 9"  (1.753 m), weight 100 kg, SpO2 93 %. General appearance: alert and cooperative GI: soft, non-tender; bowel sounds normal; no masses,  no organomegaly and inc c/d/i  Disposition:   Discharge Instructions    Diet - low sodium heart healthy   Complete by: As directed    Increase activity slowly   Complete by: As directed      Allergies as of 02/26/2021      Reactions   Bee Venom Anaphylaxis   Garlic Swelling      Medication List     TAKE these medications   albuterol (2.5 MG/3ML) 0.083% nebulizer solution Commonly known as: PROVENTIL Take 3 mLs (2.5 mg total) by nebulization every 6 (six) hours as needed for wheezing or shortness of breath. What changed: Another medication with the same name was changed. Make sure you understand how and when to take each.   ProAir HFA 108 (90 Base) MCG/ACT inhaler Generic drug: albuterol Inhale 1 puff into the lungs every 6 (six) hours as needed for wheezing or shortness of breath. What changed: how much to take   allopurinol 100 MG tablet Commonly known as: ZYLOPRIM TAKE 1 TABLET BY MOUTH  DAILY   amoxicillin-clavulanate 875-125 MG tablet Commonly known as: Augmentin Take 1 tablet by mouth 2 (two) times daily for 7 days.   atorvastatin 40 MG tablet Commonly known as: LIPITOR Take 40 mg by mouth every evening.   budesonide-formoterol 80-4.5 MCG/ACT inhaler Commonly known as: SYMBICORT Inhale 2 puffs into the lungs daily.   celecoxib 200 MG capsule Commonly known as: CELEBREX TAKE 1 CAPSULE BY MOUTH  TWICE DAILY What changed:   how much to take  when to take this   EPINEPHrine 0.3 mg/0.3 mL Soaj injection Commonly known as: EPI-PEN Inject 0.3 mg into the muscle as needed for anaphylaxis.   famotidine 20 MG tablet Commonly known as: Pepcid Take 1 tablet (20 mg total) by mouth at bedtime.   fenofibrate 160 MG tablet  TAKE 1 TABLET BY MOUTH  DAILY   fluticasone 50 MCG/ACT nasal spray Commonly known as: FLONASE Place 2 sprays into both nostrils daily. What changed:   when to take this  reasons to take this   furosemide 40 MG tablet Commonly known as: LASIX Take 40 mg by mouth daily.   gabapentin 100 MG capsule Commonly known as: NEURONTIN TAKE 2 CAPSULES BY MOUTH AT BEDTIME   HYDROcodone-acetaminophen 7.5-325 mg/15 ml solution Commonly known as: HYCET Take 15 mLs by mouth 4 (four) times daily as needed for moderate pain.   levocetirizine 5 MG  tablet Commonly known as: XYZAL Take 1 tablet (5 mg total) by mouth every evening.   magnesium oxide 400 (241.3 Mg) MG tablet Commonly known as: MAG-OX Take 1 tablet (400 mg total) by mouth daily.   meclizine 25 MG tablet Commonly known as: ANTIVERT Take 1 tablet (25 mg total) by mouth 3 (three) times daily as needed for dizziness. What changed: when to take this   memantine 10 MG tablet Commonly known as: NAMENDA TAKE 2 TABLETS BY MOUTH  DAILY   metFORMIN 500 MG tablet Commonly known as: GLUCOPHAGE TAKE 1 TABLET BY MOUTH  TWICE DAILY WITH A MEAL What changed: when to take this   metoprolol tartrate 25 MG tablet Commonly known as: LOPRESSOR TAKE 1 TABLET BY MOUTH  TWICE DAILY   nitroGLYCERIN 0.4 MG SL tablet Commonly known as: NITROSTAT Place 0.4 mg under the tongue every 5 (five) minutes as needed for chest pain.   omeprazole 40 MG capsule Commonly known as: PRILOSEC Take 1 capsule shortly before breakfast and 1 capsule shortly before dinner meal. What changed:   how much to take  how to take this  when to take this  additional instructions   ONE-A-DAY WEIGHT SMART ADVANCE PO Take 1 tablet by mouth daily. Centrum Silver   OneTouch Ultra test strip Generic drug: glucose blood USE AS DIRECTED THREE TIMES DAILY.   potassium chloride SA 20 MEQ tablet Commonly known as: KLOR-CON TAKE 2 TABLETS BY MOUTH  DAILY What changed: when to take this   SUMAtriptan 50 MG tablet Commonly known as: IMITREX Take 1 tablet as needed for migraine/vertigo. Do not take more than 3 a week What changed:   how much to take  how to take this  when to take this  reasons to take this  additional instructions   topiramate 50 MG tablet Commonly known as: TOPAMAX Take 1 tablet (50 mg total) by mouth 2 (two) times daily.   verapamil 240 MG CR tablet Commonly known as: CALAN-SR TAKE 1 TABLET BY MOUTH AT  BEDTIME   vitamin B-12 1000 MCG tablet Commonly known as:  CYANOCOBALAMIN Take 1,000 mcg by mouth daily.   Xarelto 20 MG Tabs tablet Generic drug: rivaroxaban TAKE 1 TABLET BY MOUTH  DAILY WITH SUPPER What changed:   how much to take  how to take this  when to take this  additional instructions         Addedum: Pt stayed until 02/27/21 d/t con't O2 use.  Pt eventually began using his CPAP and helped with oxygenation throughout the day.  Pt had good pulm toilet.  He was on room air and had no desats with ambulation.  He was deemed stable for DC and [place] home.  Signed: Ralene Ok 02/26/2021, 6:52 AM

## 2021-02-26 NOTE — Progress Notes (Signed)
Pharmacy Antibiotic Note  Garrett [name] is a 70 y.o. male admitted on 02/20/2021 with recurrent hiatal hernia. Patient hypoxic overnight and transferred to ICU. Pharmacy had been consulted for cefepime dosing for pneumonia now changing to Unasyn. Wbc wnl. Tmax 101.6. Chest xray favoring atelectasis/scarring with developing infiltrate not excluded. CrCl ~73 ml.min.   Pt is on D5 abx for PNA. He is now afebrile and wbc wnl. Cultures remained neg. Plan is to dc today or tomorrow.   Plan: Cont Unasyn 3g q6h  Monitor renal function, cultures, and clinical progression  Treat for 7 days with transition to Augmentin in future  Height: 5\' 9"  (175.3 cm) Weight: 100 kg (220 lb 7.4 oz) IBW/kg (Calculated) : 70.7  Temp (24hrs), Avg:98.4 F (36.9 C), Min:97.5 F (36.4 C), Max:99.7 F (37.6 C)  Recent Labs  Lab 02/22/21 0403 02/23/21 0202 02/23/21 1057 02/23/21 2047 02/24/21 0334 02/24/21 0445 02/25/21 0349 02/26/21 0125  WBC 6.3 9.5  --   --  10.0  --  9.6 8.6  CREATININE 1.24 1.10  --  1.05 1.00  --  1.00 1.06  LATICACIDVEN 2.3*  --  1.4  --   --  1.0  --   --     Estimated Creatinine Clearance: 75.6 mL/min (by C-G formula based on SCr of 1.06 mg/dL).    Allergies  Allergen Reactions  . Bee Venom Anaphylaxis  . Garlic Swelling    Antimicrobials this admission: Cefazolin x1 3/1  Cefepime 3/3 >> 3/4  Unasyn 3/4 >>3/9  Dose adjustments this admission: N/a  Microbiology results: 3/3 ucx: neg 3/3 bcx: ngtd 3/3 resp cx: ordered MRSA PCR neg   Onnie Boer, PharmD, BCIDP, AAHIVP, CPP Infectious Disease Pharmacist 02/26/2021 12:53 PM

## 2021-02-26 NOTE — Progress Notes (Signed)
Occupational Therapy Evaluation Patient Details Name: Garrett [name] MRN: [id number] DOB: 1951-12-06 Today's Date: 02/26/2021    History of Present Illness 70 year old male with asthma HFeEF,DM,PE on Xarelto, OSA, hypertension status post repair or recurrent hiatal hernia on 3/1 under surgery service found to have acute onset of fever shortness of breath and hypoxia during post op stay- needing nonrebreather transferred to ICU 3/3.Patient had vomiting postop of aspiration was suspected   Clinical Impression   PTA pt lives with is wife and is independent with ADL/IADL tasks and mobility. 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 Session limited by n/v. Pt states he feels SOB and is wearing 2L O@ however SpO2 96. Will follow acutely to facilitate safe DC home. Do not anticipate need for OT follow up.     Follow Up Recommendations  No OT follow up;Supervision - Intermittent    Equipment Recommendations  None recommended by OT    Recommendations for Other Services       Precautions / Restrictions Precautions Precautions: Fall Restrictions Weight Bearing Restrictions: No      Mobility Bed Mobility Overal bed mobility: Modified Independent (pt sitting EOB)                  Transfers                 General transfer comment: unable at this time due to nausea    Balance Overall balance assessment: Needs assistance   Sitting balance-Leahy Scale: Good Sitting balance - Comments: does not report hx of falls                                   ADL either performed or assessed with clinical judgement   ADL Overall ADL's : Needs assistance/impaired     Grooming: Set up;Supervision/safety;Sitting   Upper Body Bathing: Set up;Sitting   Lower Body Bathing: Minimal assistance;Sit to/from stand   Upper Body Dressing : Set up   Lower Body Dressing: Minimal assistance;Sit to/from stand               Functional mobility during ADLs:  (unable to assess due  to nausea; min guard with PT)       Vision         Perception     Praxis      Pertinent Vitals/Pain Pain Assessment: Faces Faces Pain Scale: Hurts little more Pain Location: stomach Pain Descriptors / Indicators: Aching;Grimacing;Guarding;Discomfort Pain Intervention(s): Limited activity within patient's tolerance     Hand Dominance Right   Extremity/Trunk Assessment Upper Extremity Assessment Upper Extremity Assessment: Overall WFL for tasks assessed   Lower Extremity Assessment Lower Extremity Assessment: Defer to PT evaluation   Cervical / Trunk Assessment Cervical / Trunk Assessment: Other exceptions (forward head' compensating due to abdominal discomfort)   Communication Communication Communication: HOH   Cognition Arousal/Alertness: Awake/alert Behavior During Therapy: WFL for tasks assessed/performed Overall Cognitive Status: Within Functional Limits for tasks assessed                                     General Comments       Exercises     Shoulder Instructions      Home Living Family/patient expects to be discharged to:: Private residence Living Arrangements: Spouse/significant other Available Help at Discharge: Family;Available 24 hours/day Type of Home: House Home Access:  Stairs to enter [employer] of Steps: 3 Entrance Stairs-Rails: Can reach both;Left;Right Home Layout: One level     Bathroom Shower/Tub: Occupational psychologist: Handicapped height Bathroom Accessibility: Yes How Accessible: Accessible via walker Home Equipment: [place] - 2 wheels;Walker - 4 wheels;Cane - single point;Shower seat          Prior Functioning/Environment Level of Independence: Independent        Comments: uses SPC at times; runs a [employer]        OT Problem List: Decreased strength;Decreased activity tolerance;Impaired balance (sitting and/or standing);Decreased knowledge of use of DME or AE;Obesity;Pain       OT Treatment/Interventions: Self-care/ADL training;Energy conservation;Therapeutic exercise;DME and/or AE instruction;Therapeutic activities;Patient/family education;Balance training    OT Goals(Current goals can be found in the care plan section) Acute Rehab OT Goals Patient Stated Goal: I want to get stronger OT Goal Formulation: With patient Time For Goal Achievement: 03/12/21 Potential to Achieve Goals: Good  OT Frequency: Min 2X/week   Barriers to D/C:            Co-evaluation              AM-PAC OT "6 Clicks" Daily Activity     Outcome Measure Help from another person eating meals?: None Help from another person taking care of personal grooming?: A Little Help from another person toileting, which includes using toliet, bedpan, or urinal?: A Little Help from another person bathing (including washing, rinsing, drying)?: A Little Help from another person to put on and taking off regular upper body clothing?: A Little Help from another person to put on and taking off regular lower body clothing?: A Little 6 Click Score: 19   End of Session Nurse Communication: Mobility status  Activity Tolerance: Other (comment) (limited by nausea) Patient left: in bed;with call bell/phone within reach  OT Visit Diagnosis: Unsteadiness on feet (R26.81);Muscle weakness (generalized) (M62.81);Pain Pain - part of body:  (abdomen)                Time: 4097-3532 OT Time Calculation (min): 15 min Charges:  OT General Charges $OT Visit: 1 Visit OT Evaluation $OT Eval Moderate Complexity: Casey, OT/L   Acute OT Clinical Specialist Acute Rehabilitation Services Pager [phone] Office [phone]   [facility] 02/26/2021, 10:48 AM

## 2021-02-27 LAB — CULTURE, BLOOD (ROUTINE X 2)
Culture: NO GROWTH
Culture: NO GROWTH
Special Requests: ADEQUATE

## 2021-02-27 LAB — COMPREHENSIVE METABOLIC PANEL
ALT: 189 U/L — ABNORMAL HIGH (ref 0–44)
AST: 78 U/L — ABNORMAL HIGH (ref 15–41)
Albumin: 2.6 g/dL — ABNORMAL LOW (ref 3.5–5.0)
Alkaline Phosphatase: 51 U/L (ref 38–126)
Anion gap: 11 (ref 5–15)
BUN: 20 mg/dL (ref 8–23)
CO2: 20 mmol/L — ABNORMAL LOW (ref 22–32)
Calcium: 9.4 mg/dL (ref 8.9–10.3)
Chloride: 114 mmol/L — ABNORMAL HIGH (ref 98–111)
Creatinine, Ser: 1.22 mg/dL (ref 0.61–1.24)
GFR, Estimated: >60 mL/min (ref >60)
Glucose, Bld: 167 mg/dL — ABNORMAL HIGH (ref 70–99)
Potassium: 3.6 mmol/L (ref 3.5–5.1)
Sodium: 145 mmol/L (ref 135–145)
Total Bilirubin: 0.7 mg/dL (ref 0.3–1.2)
Total Protein: 6.5 g/dL (ref 6.5–8.1)

## 2021-02-27 LAB — CBC WITH DIFFERENTIAL/PLATELET
Abs Immature Granulocytes: 0.21 10*3/uL — ABNORMAL HIGH (ref 0.00–0.07)
Basophils Absolute: 0.1 10*3/uL (ref 0.0–0.1)
Basophils Relative: 1 %
Eosinophils Absolute: 0.4 10*3/uL (ref 0.0–0.5)
Eosinophils Relative: 3 %
HCT: 37.3 % — ABNORMAL LOW (ref 39.0–52.0)
Hemoglobin: 12.1 g/dL — ABNORMAL LOW (ref 13.0–17.0)
Immature Granulocytes: 2 %
Lymphocytes Relative: 14 %
Lymphs Abs: 1.7 10*3/uL (ref 0.7–4.0)
MCH: 28.4 pg (ref 26.0–34.0)
MCHC: 32.4 g/dL (ref 30.0–36.0)
MCV: 87.6 fL (ref 80.0–100.0)
Monocytes Absolute: 0.9 10*3/uL (ref 0.1–1.0)
Monocytes Relative: 7 %
Neutro Abs: 9.4 10*3/uL — ABNORMAL HIGH (ref 1.7–7.7)
Neutrophils Relative %: 73 %
Platelets: 370 10*3/uL (ref 150–400)
RBC: 4.26 MIL/uL (ref 4.22–5.81)
RDW: 14.7 % (ref 11.5–15.5)
WBC: 12.7 10*3/uL — ABNORMAL HIGH (ref 4.0–10.5)
nRBC: 0.3 % — ABNORMAL HIGH (ref 0.0–0.2)

## 2021-02-27 LAB — GLUCOSE, CAPILLARY
Glucose-Capillary: 181 mg/dL — ABNORMAL HIGH (ref 70–99)
Glucose-Capillary: 180 mg/dL — ABNORMAL HIGH (ref 70–99)

## 2021-02-27 LAB — PROTIME-INR
INR: 1.3 — ABNORMAL HIGH (ref 0.8–1.2)
Prothrombin Time: 15.3 seconds — ABNORMAL HIGH (ref 11.4–15.2)

## 2021-02-27 LAB — BRAIN NATRIURETIC PEPTIDE: B Natriuretic Peptide: 66.9 pg/mL (ref 0.0–100.0)

## 2021-02-27 LAB — C-REACTIVE PROTEIN: CRP: 12.7 mg/dL — ABNORMAL HIGH (ref <1.0)

## 2021-02-27 LAB — PROCALCITONIN: Procalcitonin: 1.78 ng/mL

## 2021-02-27 LAB — MAGNESIUM: Magnesium: 2.4 mg/dL (ref 1.7–2.4)

## 2021-02-27 MED ORDER — FUROSEMIDE 40 MG PO TABS
40.0000 mg | ORAL_TABLET | Freq: Once | ORAL | Status: DC
Start: 2021-02-27 — End: 2021-02-27

## 2021-02-27 NOTE — Progress Notes (Signed)
7 Days Post-Op   Subjective/Chief Complaint: Pt doing well this AM Used CPAP overnight Less SOB this AM Less dysphagia today  Objective: Vital signs in last 24 hours: Temp:  [97.5 F (36.4 C)-98.8 F (37.1 C)] 98.8 F (37.1 C) (03/08 0400) Pulse Rate:  [58-86] 60 (03/08 0400) Resp:  [17-22] 19 (03/08 0400) BP: (123-161)/(60-81) 125/60 (03/08 0400) SpO2:  [90 %-96 %] 93 % (03/08 0400) FiO2 (%):  [96 %] 96 % (03/07 0949) Last BM Date: 02/26/21  Intake/Output from previous day: 03/07 0701 - 03/08 0700 In: 60 [P.O.:60] Out: 1000 [Urine:1000] Intake/Output this shift: No intake/output data recorded.  General appearance: alert and cooperative GI: soft, non-tender; bowel sounds normal; no masses,  no organomegaly and inc c/d/i  Lab Results:  Recent Labs    02/26/21 0125 02/27/21 0230  WBC 8.6 12.7*  HGB 10.8* 12.1*  HCT 31.9* 37.3*  PLT 331 370   BMET Recent Labs    02/26/21 0125 02/27/21 0230  NA 141 145  K 3.2* 3.6  CL 112* 114*  CO2 18* 20*  GLUCOSE 164* 167*  BUN 13 20  CREATININE 1.06 1.22  CALCIUM 9.0 9.4   PT/INR Recent Labs    02/26/21 0125 02/27/21 0230  LABPROT 23.7* 15.3*  INR 2.2* 1.3*   ABG No results for input(s): PHART, HCO3 in the last 72 hours.  Invalid input(s): PCO2, PO2  Studies/Results: DG CHEST PORT 1 VIEW  Result Date: 02/26/2021 CLINICAL DATA:  70 year old male with history of shortness of breath. EXAM: PORTABLE CHEST 1 VIEW COMPARISON:  Chest x-ray 02/25/2021. FINDINGS: Lung volumes are low. Patchy multifocal airspace consolidation and areas of interstitial prominence are again noted in the lungs bilaterally, most severe in the right upper lobe. Overall, aeration appears minimally improved compared to the prior examination. Probable trace left pleural effusion. No definite right pleural effusion. No pneumothorax. No definite suspicious appearing pulmonary nodules or masses are noted. No evidence of pulmonary edema. Heart size is  normal. Upper mediastinal contours are within normal limits allowing for patient positioning. Orthopedic fixation hardware in the lower cervical spine incidentally noted. IMPRESSION: 1. Low lung volumes with persistent but improving multilobar bilateral pneumonia, as above. Electronically Signed   By: [name] M.D.   On: 02/26/2021 09:00   DG Chest Port 1 View  Result Date: 02/25/2021 CLINICAL DATA:  Nausea.  Shortness of breath. EXAM: PORTABLE CHEST 1 VIEW COMPARISON:  February 23, 2021 FINDINGS: No pneumothorax. Infiltrate on the right is stable. The left lung is clear. No other changes. IMPRESSION: Persistent infiltrate on the right, similar in the interval. No other interval changes. Electronically Signed   By: Dorise Bullion III M.D   On: 02/25/2021 08:53   DG Abd Portable 1V  Result Date: 02/25/2021 CLINICAL DATA:  70 year old male with nausea, shortness of breath. EXAM: PORTABLE ABDOMEN - 1 VIEW COMPARISON:  None. FINDINGS: The bowel gas pattern is normal. No radio-opaque calculi or other significant radiographic abnormality are seen. Status post L3-L4 PSIF. Surgical clips in the left upper quadrant. No acute osseous abnormality. IMPRESSION: Nonobstructive bowel gas pattern. Electronically Signed   By: Ruthann Cancer MD   On: 02/25/2021 08:10   US Abdomen Limited RUQ (LIVER/GB)  Result Date: 02/25/2021 CLINICAL DATA:  Transaminitis EXAM: ULTRASOUND ABDOMEN LIMITED RIGHT UPPER QUADRANT COMPARISON:  None. FINDINGS: Gallbladder: Limited visualization with no abnormalities identified. Common bile duct: Diameter: 4.2 mm. Liver: The left hepatic lobe is not seen due to shadowing bowel gas. No focal  abnormalities identified in the liver. Increased hepatic echogenicity is noted diffusely. Portal vein is patent on color Doppler imaging with normal direction of blood flow towards the liver. Other: None. IMPRESSION: 1. The study is limited due to the patient's body habitus. There is diffuse increased  echogenicity in the liver which is nonspecific but often due to hepatic steatosis. No other abnormalities are noted. Electronically Signed   By: Dorise Bullion III M.D   On: 02/25/2021 14:56    Anti-infectives: Anti-infectives (From admission, onward)   Start     Dose/Rate Route Frequency Ordered Stop   02/26/21 0000  amoxicillin-clavulanate (AUGMENTIN) 875-125 MG tablet        1 tablet Oral 2 times daily 02/26/21 7564 03/05/21 2359   02/23/21 1300  Ampicillin-Sulbactam (UNASYN) 3 g in sodium chloride 0.9 % 100 mL IVPB        3 g 200 mL/hr over 30 Minutes Intravenous Every 6 hours 02/23/21 0903 02/28/21 2359   02/22/21 0945  ceFEPIme (MAXIPIME) 2 g in sodium chloride 0.9 % 100 mL IVPB  Status:  Discontinued        2 g 200 mL/hr over 30 Minutes Intravenous Every 8 hours 02/22/21 0850 02/23/21 0903   02/20/21 0719  ceFAZolin (ANCEF) 2-4 GM/100ML-% IVPB       Note to Pharmacy: Laurita Quint   : cabinet override      02/20/21 0719 02/20/21 1929      Assessment/Plan: s/p Procedure(s): XI ROBOTIC ASSISTED HIATAL HERNIA REPAIR WITH LYSIS OF ADHESIONS AND NISSEN FUNDOPLICATION (N/A) XI ROBOTIC ASSISTED LAPAROSCOPIC NISSEN FUNDOPLICATION (N/A) LAPAROSCOPIC LYSIS OF ADHESIONS (N/A) INSERTION OF MESH (N/A) -Con't Full liq diet -con't pulm toilet -mobilize -hopefully home later today, will check on his progress around lunch time.  LOS: 7 days    Ralene Ok 02/27/2021

## 2021-02-27 NOTE — Progress Notes (Signed)
Garrett [name] to be Garrett [name] home per MD order. D/C instructions discussed with the patient and spouse. Questions addressed.   An After Visit Summary was printed and given to the patient.  Patient escorted via Garrett [name], and D/C home via private auto. Patient belongings and valuables sent with patient.  Garrett [name] Garrett [name]  02/27/2021 4:11 PM

## 2021-02-27 NOTE — Progress Notes (Signed)
PROGRESS NOTE    Garrett [name]  PJK:[id number] DOB: 28-Oct-1951 DOA: 02/20/2021 PCP: Ann Held, DO   Brief Narrative: 70 year old male with asthma HFeEF,DM,PE on Xarelto, OSA, hypertension status post repair or recurrent hiatal hernia on 3/1 under surgery service found to have acute onset of fever shortness of breath and hypoxia during post op stay- needing nonrebreather transferred to ICU 3/3.Patient had vomiting postop of aspiration was suspected. Respiratory status stabilized and transferred to progressive neuro 3/5 TRH requested to follow along, patient is currently under surgery service  Subjective:  Patient in bed, appears comfortable, denies any headache, no fever, no chest pain or pressure, no shortness of breath , no abdominal pain. No focal weakness.   Assessment & Plan:  Acute hypoxic respiratory failure due to aspiration pneumonia-needing up to 15 L, Aspiration pneumonia -   Improved on Unasyn, speech therapy following, monitor closely with antibiotics, clinically improving continue supportive care with oxygen and nebulizer treatments.  Hiatal hernia with severe reflux status post robotic repair with lysis of adhesion and Nissen fundoplication on 3/1.  Followed by primary team continue diet as tolerated PT OT pain control.  On appropriate reflux medications.  Asthma: Continue home medications, currently on room air with no wheezing.  HFeEF: Continue beta blocker, likely acute on chronic diastolic CHF, improved after 2 doses of Lasix in the last 2 days, now symptom-free on room air with resolved wheezing and rails.  Hypertension: Blood pressure is controlled on metoprolol.  PE on Xarelto cont same  OSA-continue CPAP nightly.  Discussed with RT on 02/25/2021, reminded the treatment team again on 02/26/2021 including nurse.  Feels better after CPAP night of 02/26/2021.  Hypokalemia/hypomagnesemia: Replaced as needed.  Obesity with BMI 32 would benefit with weight  loss outpatient PCP follow-up  Rising LFTs.  Symptom-free.  Hold statin, acute right upper quadrant ultrasound showing possible underlying fatty liver.  Informed general surgery on 02/25/2021.  Could have been due to hepatic congestion improving after he was diuresed, remains symptom-free with trend better.  DM2 - ISS  Lab Results  Component Value Date   HGBA1C 6.8 (H) 02/22/2021   CBG (last 3)  Recent Labs    02/26/21 1634 02/26/21 1953 02/27/21 0733  GLUCAP 172* 154* 181*     Diet Order            Diet full liquid Room service appropriate? Yes; Fluid consistency: Thin  Diet effective now           Diet - low sodium heart healthy                Patient's Body mass index is 32.56 kg/m.   DVT prophylaxis: Xarelto, SCD Code Status:   Code Status: Full Code  Family Communication: Wife bedside on 02/26/2021, further disposition and updates per primary team - CCS  Status is: Inpatient Remains inpatient appropriate because:IV treatments appropriate due to intensity of illness or inability to take PO and Inpatient level of care appropriate due to severity of illness  Dispo: The patient is from: Home              Anticipated d/c is to: TBD              Patient currently is not medically stable to d/c.   Difficult to place patient No    Unresulted Labs (From admission, onward)          Start     Ordered   02/26/21 0500  Magnesium  Daily,   R     Question:  Specimen collection method  Answer:  Lab=Lab collect   02/25/21 0736   02/26/21 0500  C-reactive protein  Daily,   R     Question:  Specimen collection method  Answer:  Lab=Lab collect   02/25/21 0736   02/26/21 0500  Comprehensive metabolic panel  Daily,   R     Question:  Specimen collection method  Answer:  Lab=Lab collect   02/25/21 0736   02/26/21 0500  Brain natriuretic peptide  Daily,   R     Question:  Specimen collection method  Answer:  Lab=Lab collect   02/25/21 0736   02/26/21 0500  CBC with  Differential/Platelet  Daily,   R     Question:  Specimen collection method  Answer:  Lab=Lab collect   02/25/21 0736   02/26/21 0500  Procalcitonin  Daily,   R     Question:  Specimen collection method  Answer:  Lab=Lab collect   02/25/21 0736   02/26/21 0500  Protime-INR  Daily,   R     Question:  Specimen collection method  Answer:  Lab=Lab collect   02/25/21 1049   02/22/21 0636  Expectorated Sputum Assessment w Gram Stain, Rflx to Resp Cult  Once,   R        02/22/21 0636         Medications reviewed:  Scheduled Meds:   allopurinol  100 mg Oral Daily   famotidine  20 mg Oral QHS   fenofibrate  160 mg Oral Daily   insulin aspart  0-15 Units Subcutaneous TID WC   loratadine  10 mg Oral QPM   magnesium oxide  400 mg Oral Daily   memantine  20 mg Oral Daily   metoprolol tartrate  25 mg Oral BID   mometasone-formoterol  2 puff Inhalation BID   pantoprazole (PROTONIX) IV  40 mg Intravenous Q12H   rivaroxaban  20 mg Oral Q supper   sucralfate  1 g Oral TID WC & HS   topiramate  50 mg Oral BID   vitamin B-12  1,000 mcg Oral Daily   Continuous Infusions:  ampicillin-sulbactam (UNASYN) IV Stopped (02/27/21 0221)    Consultants:see note  Procedures:see note  Antimicrobials: Anti-infectives (From admission, onward)   Start     Dose/Rate Route Frequency Ordered Stop   02/26/21 0000  amoxicillin-clavulanate (AUGMENTIN) 875-125 MG tablet        1 tablet Oral 2 times daily 02/26/21 0652 03/05/21 2359   02/23/21 1300  Ampicillin-Sulbactam (UNASYN) 3 g in sodium chloride 0.9 % 100 mL IVPB        3 g 200 mL/hr over 30 Minutes Intravenous Every 6 hours 02/23/21 0903 02/28/21 2359   02/22/21 0945  ceFEPIme (MAXIPIME) 2 g in sodium chloride 0.9 % 100 mL IVPB  Status:  Discontinued        2 g 200 mL/hr over 30 Minutes Intravenous Every 8 hours 02/22/21 0850 02/23/21 0903   02/20/21 0719  ceFAZolin (ANCEF) 2-4 GM/100ML-% IVPB       Note to Pharmacy: Laurita Quint   :  cabinet override      02/20/21 0719 02/20/21 1929     Culture/Microbiology    Component Value Date/Time   SDES URINE, RANDOM 02/22/2021 0645   SPECREQUEST NONE 02/22/2021 0645   CULT  02/22/2021 0645    NO GROWTH Performed at [facility], [address], [place], [place] [address]  REPTSTATUS 02/23/2021 FINAL 02/22/2021 0645    Other culture-see note  Objective: Vitals: Today's Vitals   02/27/21 0002 02/27/21 0400 02/27/21 0727 02/27/21 0736  BP: 123/66 125/60 (!) 130/93   Pulse: (!) 58 60 83   Resp: 17 19 (!) 21   Temp: 98.7 F (37.1 C) 98.8 F (37.1 C) 97.8 F (36.6 C)   TempSrc: Axillary Oral Oral   SpO2: 93% 93% 93%   Weight:      Height:      PainSc:  0-No pain  0-No pain    Intake/Output Summary (Last 24 hours) at 02/27/2021 0838 Last data filed at 02/27/2021 0600 Gross per 24 hour  Intake 160 ml  Output 1000 ml  Net -840 ml   Filed Weights   02/20/21 0922 02/24/21 0354 02/25/21 0400  Weight: 101 kg 100 kg 100 kg   Weight change:   Intake/Output from previous day: 03/07 0701 - 03/08 0700 In: 160 [P.O.:60; IV Piggyback:100] Out: 1000 [Urine:1000] Intake/Output this shift: No intake/output data recorded. Filed Weights   02/20/21 0922 02/24/21 0354 02/25/21 0400  Weight: 101 kg 100 kg 100 kg    Examination:  Awake Alert, No new F.N deficits, Normal affect Russell.AT,PERRAL Supple Neck,No JVD, No cervical lymphadenopathy appriciated.  Symmetrical Chest wall movement, Good air movement bilaterally, CTAB RRR,No Gallops, Rubs or new Murmurs, No Parasternal Heave +ve B.Sounds, Abd Soft, No tenderness, No organomegaly appriciated, No rebound - guarding or rigidity. No Cyanosis, Clubbing or edema, No new Rash or bruise    Data Reviewed: I have personally reviewed following labs and imaging studies  Recent Labs  Lab 02/23/21 0202 02/24/21 0334 02/25/21 0349 02/26/21 0125 02/27/21 0230  WBC 9.5 10.0 9.6 8.6 12.7*  HGB 9.9* 9.6* 9.8* 10.8*  12.1*  HCT 32.0* 30.1* 29.0* 31.9* 37.3*  PLT 231 257 285 331 370  MCV 91.2 89.6 87.9 86.9 87.6  MCH 28.2 28.6 29.7 29.4 28.4  MCHC 30.9 31.9 33.8 33.9 32.4  RDW 14.6 14.4 14.3 14.4 14.7  LYMPHSABS  --   --   --  1.4 1.7  MONOABS  --   --   --  0.8 0.9  EOSABS  --   --   --  0.3 0.4  BASOSABS  --   --   --  0.1 0.1    Recent Labs  Lab 02/22/21 0403 02/22/21 0407 02/23/21 0202 02/23/21 1057 02/23/21 2047 02/24/21 0334 02/24/21 0445 02/25/21 0349 02/25/21 1134 02/26/21 0125 02/27/21 0230  NA 144  --  142  --  143 143  --  142  --  141 145  K 3.4*  --  2.9*  --  3.3* 3.3*  --  3.3*  --  3.2* 3.6  CL 114*  --  114*  --  118* 116*  --  115*  --  112* 114*  CO2 18*  --  19*  --  18* 18*  --  17*  --  18* 20*  GLUCOSE 197*  --  171*  --  148* 147*  --  151*  --  164* 167*  BUN 13  --  15  --  12 13  --  12  --  13 20  CREATININE 1.24  --  1.10  --  1.05 1.00  --  1.00  --  1.06 1.22  CALCIUM 8.8*  --  8.6*  --  8.5* 8.5*  --  8.5*  --  9.0 9.4  AST  --   --   --   --   --  48*  --  146*  --  127* 78*  ALT  --   --   --   --   --  57*  --  151*  --  190* 189*  ALKPHOS  --   --   --   --   --  42  --  45  --  51 51  BILITOT  --   --   --   --   --  0.9  --  0.7  --  0.8 0.7  ALBUMIN  --   --   --   --   --  2.5*  --  2.4*  --  2.5* 2.6*  MG  --   --  1.9  --   --  2.1  --   --   --  2.1 2.4  CRP  --   --   --   --   --   --   --   --   --  16.9* 12.7*  PROCALCITON  --   --   --   --   --   --   --  4.16  --  3.05 1.78  LATICACIDVEN 2.3*  --   --  1.4  --   --  1.0  --   --   --   --   INR  --   --   --   --   --   --   --   --  1.5* 2.2* 1.3*  HGBA1C 6.8*  --   --   --   --   --   --   --   --   --   --   BNP  --  149.2*  --   --   --  164.1*  --   --   --  123.4* 66.9    Recent Results (from the past 240 hour(s))  SARS CORONAVIRUS 2 (TAT 6-24 HRS) Nasopharyngeal Nasopharyngeal Swab     Status: None   Collection Time: 02/17/21  9:52 AM   Specimen: Nasopharyngeal Swab   Result Value Ref Range Status   SARS Coronavirus 2 NEGATIVE NEGATIVE Final    Comment: (NOTE) SARS-CoV-2 target nucleic acids are NOT DETECTED.  The SARS-CoV-2 RNA is generally detectable in upper and lower respiratory specimens during the acute phase of infection. Negative results do not preclude SARS-CoV-2 infection, do not rule out co-infections with other pathogens, and should not be used as the sole basis for treatment or other patient management decisions. Negative results must be combined with clinical observations, patient history, and epidemiological information. The expected result is Negative.  Fact Sheet for Patients: SugarRoll.be  Fact Sheet for Healthcare Providers: https://www.woods-mathews.com/  This test is not yet approved or cleared by the [place] FDA and  has been authorized for detection and/or diagnosis of SARS-CoV-2 by FDA under an Emergency Use Authorization (EUA). This EUA will remain  in effect (meaning this test can be used) for the duration of the COVID-19 declaration under Se ction 564(b)(1) of the Act, 21 U.S.C. section 360bbb-3(b)(1), unless the authorization is terminated or revoked sooner.  Performed at [facility], [place] [address] [place], [place] [address]   Culture, blood (routine x 2)     Status: None (Preliminary result)   Collection Time: 02/22/21  4:06 AM   Specimen: BLOOD  Result Value Ref Range Status   Specimen Description BLOOD LEFT ANTECUBITAL  Final  Special Requests   Final    BOTTLES DRAWN AEROBIC ONLY Blood Culture adequate volume   Culture   Final    NO GROWTH 4 DAYS Performed at [facility], [place] [address], [place], [place] [address]    Report Status PENDING  Incomplete  Culture, blood (routine x 2)     Status: None (Preliminary result)   Collection Time: 02/22/21  4:08 AM   Specimen: BLOOD LEFT HAND  Result Value Ref Range Status   Specimen Description  BLOOD LEFT HAND  Final   Special Requests   Final    BOTTLES DRAWN AEROBIC ONLY Blood Culture results may not be optimal due to an inadequate volume of blood received in culture bottles   Culture   Final    NO GROWTH 4 DAYS Performed at [facility], [place] [address] [place], [place] [address]    Report Status PENDING  Incomplete  Culture, Urine     Status: None   Collection Time: 02/22/21  6:45 AM   Specimen: Urine, Random  Result Value Ref Range Status   Specimen Description URINE, RANDOM  Final   Special Requests NONE  Final   Culture   Final    NO GROWTH Performed at [facility], [place] [address], [place], [place] [address]    Report Status 02/23/2021 FINAL  Final  MRSA PCR Screening     Status: None   Collection Time: 02/22/21  9:23 AM   Specimen: Nasopharyngeal  Result Value Ref Range Status   MRSA by PCR NEGATIVE NEGATIVE Final    Comment:        The GeneXpert MRSA Assay (FDA approved for NASAL specimens only), is one component of a comprehensive MRSA colonization surveillance program. It is not intended to diagnose MRSA infection nor to guide or monitor treatment for MRSA infections. Performed at [facility], [place] [address], [place], [place] [address]      Radiology Studies: DG CHEST PORT 1 VIEW  Result Date: 02/26/2021 CLINICAL DATA:  70 year old male with history of shortness of breath. EXAM: PORTABLE CHEST 1 VIEW COMPARISON:  Chest x-ray 02/25/2021. FINDINGS: Lung volumes are low. Patchy multifocal airspace consolidation and areas of interstitial prominence are again noted in the lungs bilaterally, most severe in the right upper lobe. Overall, aeration appears minimally improved compared to the prior examination. Probable trace left pleural effusion. No definite right pleural effusion. No pneumothorax. No definite suspicious appearing pulmonary nodules or masses are noted. No evidence of pulmonary edema. Heart size is normal. Upper mediastinal  contours are within normal limits allowing for patient positioning. Orthopedic fixation hardware in the lower cervical spine incidentally noted. IMPRESSION: 1. Low lung volumes with persistent but improving multilobar bilateral pneumonia, as above. Electronically Signed   By: [name] M.D.   On: 02/26/2021 09:00   US Abdomen Limited RUQ (LIVER/GB)  Result Date: 02/25/2021 CLINICAL DATA:  Transaminitis EXAM: ULTRASOUND ABDOMEN LIMITED RIGHT UPPER QUADRANT COMPARISON:  None. FINDINGS: Gallbladder: Limited visualization with no abnormalities identified. Common bile duct: Diameter: 4.2 mm. Liver: The left hepatic lobe is not seen due to shadowing bowel gas. No focal abnormalities identified in the liver. Increased hepatic echogenicity is noted diffusely. Portal vein is patent on color Doppler imaging with normal direction of blood flow towards the liver. Other: None. IMPRESSION: 1. The study is limited due to the patient's body habitus. There is diffuse increased echogenicity in the liver which is nonspecific but often due to  hepatic steatosis. No other abnormalities are noted. Electronically Signed   By: Dorise Bullion III M.D   On: 02/25/2021 14:56     LOS: 7 days   Lala Lund, MD [facility]  02/27/2021, 8:38 AM

## 2021-02-27 NOTE — TOC Initial Note (Addendum)
Transition of Care [facility]) - Initial/Assessment Note    Patient Details  Name: Garrett [name] MRN: [id number] Date of Birth: [birth year]  [facility]) CM/SW Contact:    Carles Collet, RN Phone Number: 02/27/2021, 11:31 AM  Clinical Narrative:                Damaris Schooner w patient and wife at bedside. Discussed needs for DC. They agree w recs for home PT and would like to use [facility] as they are familiar with them from previous use. If [facility] unable to take, they do not have a preference and agreed to any [facility] agency able to take. Patient has RW at home, confirmed there are no additional DME needs. Wife will be able to provide support after DC and provide transportation home.   [facility] verified they can accept referral.    Expected Discharge Plan: [place] Barriers to Discharge: Continued Medical Work up   Patient Goals and CMS Choice Patient states their goals for this hospitalization and ongoing recovery are:: to go home CMS Medicare.gov Compare Post Acute Care list provided to:: Patient Choice offered to / list presented to : [facility]  Expected Discharge Plan and Services Expected Discharge Plan: [place]   Discharge Planning Services: CM Consult Post Acute Care Choice: [place] arrangements for the past 2 months: Single Family Home Expected Discharge Date: 02/21/21               DME Arranged: N/A         HH Arranged: PT [place]: [place] ([place]) Date [place]: 02/27/21 Time [place]: 1131 Representative spoke with at [place]: Ramond Marrow (pending approval)  Prior Living Arrangements/Services Living arrangements for the past 2 months: [place] Lives with:: Spouse              Current home services: DME    Activities of Daily Living Home Assistive Devices/Equipment: Cane (specify quad or straight),CBG Meter,Eyeglasses,CPAP,Hearing aid,Dentures (specify type) ADL  Screening (condition at time of admission) Patient's cognitive ability adequate to safely complete daily activities?: Yes Is the patient deaf or have difficulty hearing?: Yes Does the patient have difficulty seeing, even when wearing glasses/contacts?: No Does the patient have difficulty concentrating, remembering, or making decisions?: No Patient able to express need for assistance with ADLs?: Yes Does the patient have difficulty dressing or bathing?: No Independently performs ADLs?: Yes (appropriate for developmental age) Does the patient have difficulty walking or climbing stairs?: Yes Weakness of Legs: Both Weakness of Arms/Hands: None  Permission Sought/Granted                  Emotional Assessment              Admission diagnosis:  S/P Nissen fundoplication (without gastrostomy tube) procedure [F62.130] Patient Active Problem List   Diagnosis Date Noted  . Acute respiratory distress   . Acute respiratory failure with hypoxia ([place])   . S/P Nissen fundoplication (without gastrostomy tube) procedure 02/20/2021  . Laceration of right hand 08/02/2019  . Acute pulmonary embolism ([place]) 01/07/2019  . Chronic diastolic CHF (congestive heart failure) ([place]) 01/07/2019  . Preventative health care 02/10/2018  . Dizziness 02/10/2018  . Spondylolisthesis at L3-L4 level 10/28/2017  . Cough variant asthma  vs uacs/ pseudoasthma 06/17/2017  . Pneumonia of right middle lobe due to infectious organism 02/28/2017  . Pulmonary embolism and infarction ([place]) 02/09/2017  . Bilateral pulmonary embolism (  [place]) 02/04/2017  . Greater trochanteric bursitis of left hip 01/14/2017  . Greater trochanteric bursitis of right hip 12/20/2016  . Degenerative arthritis of knee, bilateral 10/08/2016  . Upper airway cough syndrome 03/22/2016  . Multiple pulmonary nodules 03/22/2016  . Acute bronchitis 01/22/2016  . Acute upper respiratory infection 10/25/2015  . Elevated CK 07/18/2015  . History of  colonic polyps 04/11/2015  . Lumbar stenosis with neurogenic claudication 11/14/2014  . Spondylolysis of cervical region 02/25/2014  . OSA (obstructive sleep apnea) 12/08/2013  . SOB (shortness of breath) on exertion 11/04/2013  . Morbid obesity due to excess calories ([place])   . Cervicalgia   . Elevated PSA, less than 10 ng/ml 03/23/2013  . Venous insufficiency of leg 02/18/2012  . Itching 02/18/2012  . Mild dementia ([place]) 05/28/2011  . Hyperlipidemia associated with type 2 diabetes mellitus ([place]) 05/27/2011  . Controlled type 2 diabetes mellitus with diabetic nephropathy ([place]) 04/10/2011  . Gout 04/10/2011  . Essential hypertension 04/10/2011  . OA (osteoarthritis) 04/10/2011  . GERD (gastroesophageal reflux disease) 04/10/2011  . Migraine headache without aura 04/10/2011  . Allergic rhinitis, cause unspecified 04/10/2011  . Bee sting allergy 04/10/2011   PCP:  Ann Held, DO Pharmacy:   [facility], [place] - [address] [place] [address] Phone: [phone] Fax: [phone]  [facility], [place], [address] [place], [place] [address] Phone: [phone] Fax: [phone]     Social Determinants of Health (SDOH) Interventions    Readmission Risk Interventions No flowsheet data found.

## 2021-02-27 NOTE — Progress Notes (Signed)
Physical Therapy Treatment Patient Details Name: Garrett [name] MRN: [id number] DOB: 06-30-1951 Today's Date: 02/27/2021    History of Present Illness 70 year old male with asthma HFeEF,DM,PE on Xarelto, OSA, hypertension status post repair or recurrent hiatal hernia on 3/1 under surgery service found to have acute onset of fever shortness of breath and hypoxia during post op stay- needing nonrebreather transferred to ICU 3/3.Patient had vomiting postop of aspiration was suspected    PT Comments    Pt up in recliner agreeable to therapy, reports that he is not nauseated today. Pt limited in safe mobility by decreased strength and endurance in presence of DoE. Despite DoE pt SaO2 with mobility improved today, when pt has good pleth wave form SaO2 93-94%O2 with 3/4 DoE. Pt is currently min guard for transfers and min guard to supervision for ambulation with RW. Pt only able to tolerate 146ft of ambulation due to LE weakness. Pt encouraged to perform chair exercise and IS use. D/c plans remain appropriate if pt can continue to progress mobility. PT will will continue to follow acutely to improve strength and mobility.   Follow Up Recommendations  Home health PT;Supervision for mobility/OOB;Other (comment) (wife unable to assist in care so if pt mobility does not increase when medically ready for d/c may need short term rehab, encouraged pt to ambulate with nursing to improve tolerance)     Equipment Recommendations    none recommended pt has appropriate equipment      Precautions / Restrictions Precautions Precautions: Fall Precaution Comments: monitor O2 Restrictions Weight Bearing Restrictions: No    Mobility  Bed Mobility               General bed mobility comments: up in recliner on entry    Transfers Overall transfer level: Needs assistance Equipment used: Rolling walker (2 wheeled) Transfers: Sit to/from Stand Sit to Stand: Min guard         General transfer  comment: increased time and effort but good power up and steadying with RW  Ambulation/Gait Ambulation/Gait assistance: Min guard;Supervision Gait Distance (Feet): 100 Feet Assistive device: Rolling walker (2 wheeled);None Gait Pattern/deviations: Step-through pattern;Decreased step length - right;Decreased step length - left;Shuffle;Trunk flexed Gait velocity: slowed Gait velocity interpretation: 1.31 - 2.62 ft/sec, indicative of limited community ambulator General Gait Details: min guard progressing to supervision for safety, vc for upright posture and proximity to RW, shuffling decreased with distance however pt with reports of increasing weakness         Balance Overall balance assessment: Needs assistance Sitting-balance support: Feet supported;No upper extremity supported Sitting balance-Leahy Scale: Good     Standing balance support: No upper extremity supported;Single extremity supported;Bilateral upper extremity supported Standing balance-Leahy Scale: Fair Standing balance comment: requires UE support for dynamic balance                            Cognition Arousal/Alertness: Awake/alert Behavior During Therapy: WFL for tasks assessed/performed Overall Cognitive Status: Within Functional Limits for tasks assessed                                        Exercises Other Exercises Other Exercises: IS x 5, max inhale 1250 mL, pt irritated with coughing and stopped, educated that coughing is okay, but that he should still aim for 10 per hour just spaced them out    General  Comments General comments (skin integrity, edema, etc.): SaO2 on RA at rest 96%O2, poor pleath with ambulation however with standing rest break SaO2 quickly rebounded to 94%O2 despite 3/4 DoE, HR max noted with ambulation 122 bpm      Pertinent Vitals/Pain Pain Assessment: No/denies pain           PT Goals (current goals can now be found in the care plan section) Acute  Rehab PT Goals Patient Stated Goal: I want to get stronger PT Goal Formulation: With patient Time For Goal Achievement: 03/11/21 Potential to Achieve Goals: Good Progress towards PT goals: Progressing toward goals    Frequency    Min 3X/week      PT Plan Current plan remains appropriate;Other (comment) (wife unable to assist in care so if pt mobility does not increase when medically ready for d/c may need short term rehab, encouraged pt to ambulate with nursing to improve tolerance)       AM-PAC PT "6 Clicks" Mobility   Outcome Measure  Help needed turning from your back to your side while in a flat bed without using bedrails?: None Help needed moving from lying on your back to sitting on the side of a flat bed without using bedrails?: None Help needed moving to and from a bed to a chair (including a wheelchair)?: A Little Help needed standing up from a chair using your arms (e.g., wheelchair or bedside chair)?: A Little Help needed to walk in hospital room?: A Little Help needed climbing 3-5 steps with a railing? : A Lot 6 Click Score: 19    End of Session Equipment Utilized During Treatment: Gait belt Activity Tolerance: Patient tolerated treatment well Patient left: in chair;with call bell/phone within reach;with chair alarm set;with family/visitor present Nurse Communication: Mobility status PT Visit Diagnosis: Unsteadiness on feet (R26.81);Muscle weakness (generalized) (M62.81);Other abnormalities of gait and mobility (R26.89)     Time: 5997-7414 PT Time Calculation (min) (ACUTE ONLY): 33 min  Charges:  $Gait Training: 8-22 mins $Therapeutic Exercise: 8-22 mins                     Elizabeth B. Migdalia Dk PT, DPT Acute Rehabilitation Services Pager [phone] Office [phone]    [place] 02/27/2021, 9:49 AM

## 2021-03-01 ENCOUNTER — Telehealth: Payer: Self-pay | Admitting: Family Medicine

## 2021-03-01 ENCOUNTER — Telehealth: Payer: Self-pay

## 2021-03-01 DIAGNOSIS — K219 Gastro-esophageal reflux disease without esophagitis: Secondary | ICD-10-CM | POA: Diagnosis not present

## 2021-03-01 DIAGNOSIS — J9601 Acute respiratory failure with hypoxia: Secondary | ICD-10-CM | POA: Diagnosis not present

## 2021-03-01 DIAGNOSIS — E119 Type 2 diabetes mellitus without complications: Secondary | ICD-10-CM | POA: Diagnosis not present

## 2021-03-01 DIAGNOSIS — J45909 Unspecified asthma, uncomplicated: Secondary | ICD-10-CM | POA: Diagnosis not present

## 2021-03-01 DIAGNOSIS — R1310 Dysphagia, unspecified: Secondary | ICD-10-CM | POA: Diagnosis not present

## 2021-03-01 DIAGNOSIS — K449 Diaphragmatic hernia without obstruction or gangrene: Secondary | ICD-10-CM | POA: Diagnosis not present

## 2021-03-01 DIAGNOSIS — Z7984 Long term (current) use of oral hypoglycemic drugs: Secondary | ICD-10-CM | POA: Diagnosis not present

## 2021-03-01 DIAGNOSIS — R42 Dizziness and giddiness: Secondary | ICD-10-CM | POA: Diagnosis not present

## 2021-03-01 DIAGNOSIS — Z79899 Other long term (current) drug therapy: Secondary | ICD-10-CM | POA: Diagnosis not present

## 2021-03-01 DIAGNOSIS — I10 Essential (primary) hypertension: Secondary | ICD-10-CM | POA: Diagnosis not present

## 2021-03-01 DIAGNOSIS — R197 Diarrhea, unspecified: Secondary | ICD-10-CM | POA: Diagnosis not present

## 2021-03-01 DIAGNOSIS — Z48815 Encounter for surgical aftercare following surgery on the digestive system: Secondary | ICD-10-CM | POA: Diagnosis not present

## 2021-03-01 NOTE — Telephone Encounter (Signed)
Peter from physical therapy called he needs doctor order for therapy with frequency 2x a week for 1 week

## 2021-03-01 NOTE — Telephone Encounter (Signed)
Ok to ordere#30  and pt needs to make f/u ov

## 2021-03-01 NOTE — Telephone Encounter (Signed)
Last seen 07/2020. Okay for orders?

## 2021-03-01 NOTE — Telephone Encounter (Signed)
Spoke with patient to see if he needed a hospital follow up appt with Dr. Etter Sjogren (not stated in discharge summary). He says he has a follow up appt with the surgeon & does not need  to follow up with PCP.

## 2021-03-02 ENCOUNTER — Other Ambulatory Visit: Payer: Self-pay

## 2021-03-02 ENCOUNTER — Ambulatory Visit: Payer: Medicare Other | Admitting: Podiatry

## 2021-03-02 DIAGNOSIS — E1121 Type 2 diabetes mellitus with diabetic nephropathy: Secondary | ICD-10-CM

## 2021-03-02 DIAGNOSIS — M79675 Pain in left toe(s): Secondary | ICD-10-CM

## 2021-03-02 DIAGNOSIS — B351 Tinea unguium: Secondary | ICD-10-CM | POA: Diagnosis not present

## 2021-03-02 DIAGNOSIS — M79674 Pain in right toe(s): Secondary | ICD-10-CM | POA: Diagnosis not present

## 2021-03-05 DIAGNOSIS — J9601 Acute respiratory failure with hypoxia: Secondary | ICD-10-CM | POA: Diagnosis not present

## 2021-03-05 DIAGNOSIS — Z7984 Long term (current) use of oral hypoglycemic drugs: Secondary | ICD-10-CM | POA: Diagnosis not present

## 2021-03-05 DIAGNOSIS — R1310 Dysphagia, unspecified: Secondary | ICD-10-CM | POA: Diagnosis not present

## 2021-03-05 DIAGNOSIS — K219 Gastro-esophageal reflux disease without esophagitis: Secondary | ICD-10-CM | POA: Diagnosis not present

## 2021-03-05 DIAGNOSIS — Z79899 Other long term (current) drug therapy: Secondary | ICD-10-CM | POA: Diagnosis not present

## 2021-03-05 DIAGNOSIS — R197 Diarrhea, unspecified: Secondary | ICD-10-CM | POA: Diagnosis not present

## 2021-03-05 DIAGNOSIS — R42 Dizziness and giddiness: Secondary | ICD-10-CM | POA: Diagnosis not present

## 2021-03-05 DIAGNOSIS — I10 Essential (primary) hypertension: Secondary | ICD-10-CM | POA: Diagnosis not present

## 2021-03-05 DIAGNOSIS — E119 Type 2 diabetes mellitus without complications: Secondary | ICD-10-CM | POA: Diagnosis not present

## 2021-03-05 DIAGNOSIS — J45909 Unspecified asthma, uncomplicated: Secondary | ICD-10-CM | POA: Diagnosis not present

## 2021-03-05 DIAGNOSIS — Z48815 Encounter for surgical aftercare following surgery on the digestive system: Secondary | ICD-10-CM | POA: Diagnosis not present

## 2021-03-05 DIAGNOSIS — K449 Diaphragmatic hernia without obstruction or gangrene: Secondary | ICD-10-CM | POA: Diagnosis not present

## 2021-03-05 NOTE — Telephone Encounter (Signed)
Called and left a message on the number provided below.

## 2021-03-06 ENCOUNTER — Encounter: Payer: Self-pay | Admitting: Podiatry

## 2021-03-06 NOTE — Progress Notes (Signed)
Subjective:  Patient ID: Garrett [name], male    DOB: 05-05-1951,  MRN: [id number]  Chief Complaint  Patient presents with  . Nail Problem    Nail trim    70 y.o. male returns for the above complaint.  Patient presents with thickened elongated dystrophic toenails x10.  Patient would like to debride them down as he is not able to do it ASAP.  He denies any other acute complaints.  Patient is a diabetic with last A1c of 6.3.  He does not have any other complaints  Objective:  There were no vitals filed for this visit. Podiatric Exam: Vascular: dorsalis pedis and posterior tibial pulses are palpable bilateral. Capillary return is immediate. Temperature gradient is WNL. Skin turgor WNL  Sensorium: Normal Semmes Weinstein monofilament test. Normal tactile sensation bilaterally. Nail Exam: Pt has thick disfigured discolored nails with subungual debris noted bilateral entire nail hallux through fifth toenails.  Pain on palpation to the nails. Ulcer Exam: There is no evidence of ulcer or pre-ulcerative changes or infection. Orthopedic Exam: Muscle tone and strength are WNL. No limitations in general ROM. No crepitus or effusions noted. HAV  B/L.  Hammer toes 2-5  B/L.  Pain on palpation with range of motion of the second digit bilaterally.  No intra-articular pain noted.  No extensor or flexor tendinitis clinically appreciated. Skin: No Porokeratosis. No infection or ulcers.  Generalized edema noted 1+ pitting edema to both lower extremity at the ankles and the level of the forefoot.    Assessment & Plan:   1. Controlled type 2 diabetes mellitus with diabetic nephropathy, without long-term current use of insulin (HCC)   2. Pain due to onychomycosis of toenails of both feet     Patient was evaluated and treated and all questions answered.  Bilateral second metatarsophalangeal joint capsulitis -I explained to patient the etiology of capsulitis versus treatment options were discussed.  I  believe patient will benefit from a steroid injection help decrease acute inflammatory component associated pain.  Patient agrees with the plan like to proceed with a steroid injection. -A steroid injection was performed at bilateral second metatarsophalangeal joint using 1% plain Lidocaine and 10 mg of Kenalog. This was well tolerated.   Generalized edema to lower extremity -I explained to the patient the etiology of edema and various treatment options were discussed.  I discussed with the patient that given that there is generalized swelling to the lower extremity that resolves at night patient may have dependent edema.  I discussed with him that compression socks and elevation can help with that.  Patient states understanding will do so  Xerosis bilateral lower extremity -I explained to the patient the etiology of xerosis and various treatment options were extensively discussed.  I explained to the patient the importance of maintaining moisturization of the skin with application of over-the-counter lotion such as Eucerin or Luciderm.  I have asked the patient to apply this twice a day.  If unable to resolve patient will benefit from prescription lotion.  Onychomycosis with pain  -Nails palliatively debrided as below. -Educated on self-care  Procedure: Nail Debridement Rationale: pain  Type of Debridement: manual, sharp debridement. Instrumentation: Nail nipper, rotary burr. Number of Nails: 10  Procedures and Treatment: Consent by patient was obtained for treatment procedures. The patient understood the discussion of treatment and procedures well. All questions were answered thoroughly reviewed. Debridement of mycotic and hypertrophic toenails, 1 through 5 bilateral and clearing of subungual debris. No ulceration, no infection noted.  Return Visit-Office Procedure: Patient instructed to return to the office for a follow up visit 3 months for continued evaluation and treatment.  Boneta Lucks,  DPM    Return in about 3 months (around 06/02/2021).

## 2021-03-07 ENCOUNTER — Telehealth: Payer: Self-pay | Admitting: Family Medicine

## 2021-03-07 NOTE — Telephone Encounter (Signed)
Verbal given 

## 2021-03-07 NOTE — Telephone Encounter (Signed)
Garrett [name] from Physical therapy [place] [phone]   2 X a week for 4 weeks

## 2021-03-08 DIAGNOSIS — Z7984 Long term (current) use of oral hypoglycemic drugs: Secondary | ICD-10-CM | POA: Diagnosis not present

## 2021-03-08 DIAGNOSIS — E119 Type 2 diabetes mellitus without complications: Secondary | ICD-10-CM | POA: Diagnosis not present

## 2021-03-08 DIAGNOSIS — Z48815 Encounter for surgical aftercare following surgery on the digestive system: Secondary | ICD-10-CM | POA: Diagnosis not present

## 2021-03-08 DIAGNOSIS — J45909 Unspecified asthma, uncomplicated: Secondary | ICD-10-CM | POA: Diagnosis not present

## 2021-03-08 DIAGNOSIS — R1310 Dysphagia, unspecified: Secondary | ICD-10-CM | POA: Diagnosis not present

## 2021-03-08 DIAGNOSIS — K449 Diaphragmatic hernia without obstruction or gangrene: Secondary | ICD-10-CM | POA: Diagnosis not present

## 2021-03-08 DIAGNOSIS — R42 Dizziness and giddiness: Secondary | ICD-10-CM | POA: Diagnosis not present

## 2021-03-08 DIAGNOSIS — R197 Diarrhea, unspecified: Secondary | ICD-10-CM | POA: Diagnosis not present

## 2021-03-08 DIAGNOSIS — K219 Gastro-esophageal reflux disease without esophagitis: Secondary | ICD-10-CM | POA: Diagnosis not present

## 2021-03-08 DIAGNOSIS — I10 Essential (primary) hypertension: Secondary | ICD-10-CM | POA: Diagnosis not present

## 2021-03-08 DIAGNOSIS — Z79899 Other long term (current) drug therapy: Secondary | ICD-10-CM | POA: Diagnosis not present

## 2021-03-08 DIAGNOSIS — J9601 Acute respiratory failure with hypoxia: Secondary | ICD-10-CM | POA: Diagnosis not present

## 2021-03-08 NOTE — Telephone Encounter (Signed)
Thank you :)

## 2021-03-13 DIAGNOSIS — E119 Type 2 diabetes mellitus without complications: Secondary | ICD-10-CM | POA: Diagnosis not present

## 2021-03-13 DIAGNOSIS — R1310 Dysphagia, unspecified: Secondary | ICD-10-CM | POA: Diagnosis not present

## 2021-03-13 DIAGNOSIS — R42 Dizziness and giddiness: Secondary | ICD-10-CM | POA: Diagnosis not present

## 2021-03-13 DIAGNOSIS — J9601 Acute respiratory failure with hypoxia: Secondary | ICD-10-CM | POA: Diagnosis not present

## 2021-03-13 DIAGNOSIS — Z48815 Encounter for surgical aftercare following surgery on the digestive system: Secondary | ICD-10-CM | POA: Diagnosis not present

## 2021-03-13 DIAGNOSIS — J45909 Unspecified asthma, uncomplicated: Secondary | ICD-10-CM | POA: Diagnosis not present

## 2021-03-13 DIAGNOSIS — Z79899 Other long term (current) drug therapy: Secondary | ICD-10-CM | POA: Diagnosis not present

## 2021-03-13 DIAGNOSIS — R197 Diarrhea, unspecified: Secondary | ICD-10-CM | POA: Diagnosis not present

## 2021-03-13 DIAGNOSIS — I10 Essential (primary) hypertension: Secondary | ICD-10-CM | POA: Diagnosis not present

## 2021-03-13 DIAGNOSIS — Z7984 Long term (current) use of oral hypoglycemic drugs: Secondary | ICD-10-CM | POA: Diagnosis not present

## 2021-03-13 DIAGNOSIS — K449 Diaphragmatic hernia without obstruction or gangrene: Secondary | ICD-10-CM | POA: Diagnosis not present

## 2021-03-13 DIAGNOSIS — K219 Gastro-esophageal reflux disease without esophagitis: Secondary | ICD-10-CM | POA: Diagnosis not present

## 2021-03-14 DIAGNOSIS — D4981 Neoplasm of unspecified behavior of retina and choroid: Secondary | ICD-10-CM | POA: Diagnosis not present

## 2021-03-14 DIAGNOSIS — H00022 Hordeolum internum right lower eyelid: Secondary | ICD-10-CM | POA: Diagnosis not present

## 2021-03-14 DIAGNOSIS — H5022 Vertical strabismus, left eye: Secondary | ICD-10-CM | POA: Diagnosis not present

## 2021-03-14 DIAGNOSIS — H26493 Other secondary cataract, bilateral: Secondary | ICD-10-CM | POA: Diagnosis not present

## 2021-03-14 DIAGNOSIS — E119 Type 2 diabetes mellitus without complications: Secondary | ICD-10-CM | POA: Diagnosis not present

## 2021-03-15 DIAGNOSIS — Z48815 Encounter for surgical aftercare following surgery on the digestive system: Secondary | ICD-10-CM | POA: Diagnosis not present

## 2021-03-15 DIAGNOSIS — K449 Diaphragmatic hernia without obstruction or gangrene: Secondary | ICD-10-CM | POA: Diagnosis not present

## 2021-03-15 DIAGNOSIS — K219 Gastro-esophageal reflux disease without esophagitis: Secondary | ICD-10-CM | POA: Diagnosis not present

## 2021-03-15 DIAGNOSIS — I10 Essential (primary) hypertension: Secondary | ICD-10-CM | POA: Diagnosis not present

## 2021-03-15 DIAGNOSIS — J9601 Acute respiratory failure with hypoxia: Secondary | ICD-10-CM | POA: Diagnosis not present

## 2021-03-15 DIAGNOSIS — Z7984 Long term (current) use of oral hypoglycemic drugs: Secondary | ICD-10-CM | POA: Diagnosis not present

## 2021-03-15 DIAGNOSIS — R42 Dizziness and giddiness: Secondary | ICD-10-CM | POA: Diagnosis not present

## 2021-03-15 DIAGNOSIS — R1310 Dysphagia, unspecified: Secondary | ICD-10-CM | POA: Diagnosis not present

## 2021-03-15 DIAGNOSIS — R197 Diarrhea, unspecified: Secondary | ICD-10-CM | POA: Diagnosis not present

## 2021-03-15 DIAGNOSIS — Z79899 Other long term (current) drug therapy: Secondary | ICD-10-CM | POA: Diagnosis not present

## 2021-03-15 DIAGNOSIS — J45909 Unspecified asthma, uncomplicated: Secondary | ICD-10-CM | POA: Diagnosis not present

## 2021-03-15 DIAGNOSIS — E119 Type 2 diabetes mellitus without complications: Secondary | ICD-10-CM | POA: Diagnosis not present

## 2021-03-20 DIAGNOSIS — I10 Essential (primary) hypertension: Secondary | ICD-10-CM | POA: Diagnosis not present

## 2021-03-20 DIAGNOSIS — Z7984 Long term (current) use of oral hypoglycemic drugs: Secondary | ICD-10-CM | POA: Diagnosis not present

## 2021-03-20 DIAGNOSIS — E119 Type 2 diabetes mellitus without complications: Secondary | ICD-10-CM | POA: Diagnosis not present

## 2021-03-20 DIAGNOSIS — J9601 Acute respiratory failure with hypoxia: Secondary | ICD-10-CM | POA: Diagnosis not present

## 2021-03-20 DIAGNOSIS — R1310 Dysphagia, unspecified: Secondary | ICD-10-CM | POA: Diagnosis not present

## 2021-03-20 DIAGNOSIS — R197 Diarrhea, unspecified: Secondary | ICD-10-CM | POA: Diagnosis not present

## 2021-03-20 DIAGNOSIS — K449 Diaphragmatic hernia without obstruction or gangrene: Secondary | ICD-10-CM | POA: Diagnosis not present

## 2021-03-20 DIAGNOSIS — Z79899 Other long term (current) drug therapy: Secondary | ICD-10-CM | POA: Diagnosis not present

## 2021-03-20 DIAGNOSIS — J45909 Unspecified asthma, uncomplicated: Secondary | ICD-10-CM | POA: Diagnosis not present

## 2021-03-20 DIAGNOSIS — Z48815 Encounter for surgical aftercare following surgery on the digestive system: Secondary | ICD-10-CM | POA: Diagnosis not present

## 2021-03-20 DIAGNOSIS — R42 Dizziness and giddiness: Secondary | ICD-10-CM | POA: Diagnosis not present

## 2021-03-20 DIAGNOSIS — K219 Gastro-esophageal reflux disease without esophagitis: Secondary | ICD-10-CM | POA: Diagnosis not present

## 2021-03-22 DIAGNOSIS — Z79899 Other long term (current) drug therapy: Secondary | ICD-10-CM | POA: Diagnosis not present

## 2021-03-22 DIAGNOSIS — Z48815 Encounter for surgical aftercare following surgery on the digestive system: Secondary | ICD-10-CM | POA: Diagnosis not present

## 2021-03-22 DIAGNOSIS — R1310 Dysphagia, unspecified: Secondary | ICD-10-CM | POA: Diagnosis not present

## 2021-03-22 DIAGNOSIS — Z7984 Long term (current) use of oral hypoglycemic drugs: Secondary | ICD-10-CM | POA: Diagnosis not present

## 2021-03-22 DIAGNOSIS — I10 Essential (primary) hypertension: Secondary | ICD-10-CM | POA: Diagnosis not present

## 2021-03-22 DIAGNOSIS — R197 Diarrhea, unspecified: Secondary | ICD-10-CM | POA: Diagnosis not present

## 2021-03-22 DIAGNOSIS — K219 Gastro-esophageal reflux disease without esophagitis: Secondary | ICD-10-CM | POA: Diagnosis not present

## 2021-03-22 DIAGNOSIS — K449 Diaphragmatic hernia without obstruction or gangrene: Secondary | ICD-10-CM | POA: Diagnosis not present

## 2021-03-22 DIAGNOSIS — E119 Type 2 diabetes mellitus without complications: Secondary | ICD-10-CM | POA: Diagnosis not present

## 2021-03-22 DIAGNOSIS — R42 Dizziness and giddiness: Secondary | ICD-10-CM | POA: Diagnosis not present

## 2021-03-22 DIAGNOSIS — J45909 Unspecified asthma, uncomplicated: Secondary | ICD-10-CM | POA: Diagnosis not present

## 2021-03-22 DIAGNOSIS — J9601 Acute respiratory failure with hypoxia: Secondary | ICD-10-CM | POA: Diagnosis not present

## 2021-03-27 DIAGNOSIS — J9601 Acute respiratory failure with hypoxia: Secondary | ICD-10-CM | POA: Diagnosis not present

## 2021-03-27 DIAGNOSIS — R1310 Dysphagia, unspecified: Secondary | ICD-10-CM | POA: Diagnosis not present

## 2021-03-27 DIAGNOSIS — J45909 Unspecified asthma, uncomplicated: Secondary | ICD-10-CM | POA: Diagnosis not present

## 2021-03-27 DIAGNOSIS — I10 Essential (primary) hypertension: Secondary | ICD-10-CM | POA: Diagnosis not present

## 2021-03-27 DIAGNOSIS — E119 Type 2 diabetes mellitus without complications: Secondary | ICD-10-CM | POA: Diagnosis not present

## 2021-03-27 DIAGNOSIS — Z48815 Encounter for surgical aftercare following surgery on the digestive system: Secondary | ICD-10-CM | POA: Diagnosis not present

## 2021-03-27 DIAGNOSIS — K219 Gastro-esophageal reflux disease without esophagitis: Secondary | ICD-10-CM | POA: Diagnosis not present

## 2021-03-27 DIAGNOSIS — Z79899 Other long term (current) drug therapy: Secondary | ICD-10-CM | POA: Diagnosis not present

## 2021-03-27 DIAGNOSIS — Z7984 Long term (current) use of oral hypoglycemic drugs: Secondary | ICD-10-CM | POA: Diagnosis not present

## 2021-03-27 DIAGNOSIS — K449 Diaphragmatic hernia without obstruction or gangrene: Secondary | ICD-10-CM | POA: Diagnosis not present

## 2021-03-27 DIAGNOSIS — R42 Dizziness and giddiness: Secondary | ICD-10-CM | POA: Diagnosis not present

## 2021-03-27 DIAGNOSIS — R197 Diarrhea, unspecified: Secondary | ICD-10-CM | POA: Diagnosis not present

## 2021-03-29 ENCOUNTER — Other Ambulatory Visit: Payer: Self-pay | Admitting: Family Medicine

## 2021-03-29 ENCOUNTER — Other Ambulatory Visit: Payer: Self-pay | Admitting: Gastroenterology

## 2021-03-29 DIAGNOSIS — J9601 Acute respiratory failure with hypoxia: Secondary | ICD-10-CM | POA: Diagnosis not present

## 2021-03-29 DIAGNOSIS — Z48815 Encounter for surgical aftercare following surgery on the digestive system: Secondary | ICD-10-CM | POA: Diagnosis not present

## 2021-03-29 DIAGNOSIS — K219 Gastro-esophageal reflux disease without esophagitis: Secondary | ICD-10-CM | POA: Diagnosis not present

## 2021-03-29 DIAGNOSIS — R1310 Dysphagia, unspecified: Secondary | ICD-10-CM | POA: Diagnosis not present

## 2021-03-29 DIAGNOSIS — E119 Type 2 diabetes mellitus without complications: Secondary | ICD-10-CM | POA: Diagnosis not present

## 2021-03-29 DIAGNOSIS — Z7984 Long term (current) use of oral hypoglycemic drugs: Secondary | ICD-10-CM | POA: Diagnosis not present

## 2021-03-29 DIAGNOSIS — M1000 Idiopathic gout, unspecified site: Secondary | ICD-10-CM

## 2021-03-29 DIAGNOSIS — J45909 Unspecified asthma, uncomplicated: Secondary | ICD-10-CM | POA: Diagnosis not present

## 2021-03-29 DIAGNOSIS — K449 Diaphragmatic hernia without obstruction or gangrene: Secondary | ICD-10-CM | POA: Diagnosis not present

## 2021-03-29 DIAGNOSIS — R197 Diarrhea, unspecified: Secondary | ICD-10-CM | POA: Diagnosis not present

## 2021-03-29 DIAGNOSIS — R42 Dizziness and giddiness: Secondary | ICD-10-CM | POA: Diagnosis not present

## 2021-03-29 DIAGNOSIS — I10 Essential (primary) hypertension: Secondary | ICD-10-CM | POA: Diagnosis not present

## 2021-03-29 DIAGNOSIS — Z79899 Other long term (current) drug therapy: Secondary | ICD-10-CM | POA: Diagnosis not present

## 2021-04-20 DIAGNOSIS — R9439 Abnormal result of other cardiovascular function study: Secondary | ICD-10-CM | POA: Diagnosis not present

## 2021-04-20 DIAGNOSIS — R0600 Dyspnea, unspecified: Secondary | ICD-10-CM | POA: Diagnosis not present

## 2021-05-01 ENCOUNTER — Ambulatory Visit: Payer: Medicare Other | Admitting: Family Medicine

## 2021-05-09 ENCOUNTER — Other Ambulatory Visit: Payer: Self-pay | Admitting: Family Medicine

## 2021-05-10 ENCOUNTER — Other Ambulatory Visit: Payer: Self-pay

## 2021-05-10 NOTE — Telephone Encounter (Signed)
Sent patient MyChart message to schedule or ask PCP for refill.

## 2021-05-11 ENCOUNTER — Ambulatory Visit (INDEPENDENT_AMBULATORY_CARE_PROVIDER_SITE_OTHER): Payer: Medicare Other | Admitting: Family Medicine

## 2021-05-11 ENCOUNTER — Other Ambulatory Visit: Payer: Self-pay | Admitting: Family Medicine

## 2021-05-11 ENCOUNTER — Encounter: Payer: Self-pay | Admitting: Family Medicine

## 2021-05-11 VITALS — BP 118/78 | HR 70 | Temp 97.8°F | Resp 18 | Ht 69.0 in | Wt 222.6 lb

## 2021-05-11 DIAGNOSIS — I2699 Other pulmonary embolism without acute cor pulmonale: Secondary | ICD-10-CM | POA: Diagnosis not present

## 2021-05-11 DIAGNOSIS — M1A09X0 Idiopathic chronic gout, multiple sites, without tophus (tophi): Secondary | ICD-10-CM | POA: Diagnosis not present

## 2021-05-11 DIAGNOSIS — R351 Nocturia: Secondary | ICD-10-CM

## 2021-05-11 DIAGNOSIS — I5032 Chronic diastolic (congestive) heart failure: Secondary | ICD-10-CM

## 2021-05-11 DIAGNOSIS — I10 Essential (primary) hypertension: Secondary | ICD-10-CM | POA: Diagnosis not present

## 2021-05-11 DIAGNOSIS — R0600 Dyspnea, unspecified: Secondary | ICD-10-CM

## 2021-05-11 DIAGNOSIS — E1165 Type 2 diabetes mellitus with hyperglycemia: Secondary | ICD-10-CM

## 2021-05-11 DIAGNOSIS — E1169 Type 2 diabetes mellitus with other specified complication: Secondary | ICD-10-CM | POA: Diagnosis not present

## 2021-05-11 DIAGNOSIS — N529 Male erectile dysfunction, unspecified: Secondary | ICD-10-CM

## 2021-05-11 DIAGNOSIS — E785 Hyperlipidemia, unspecified: Secondary | ICD-10-CM | POA: Diagnosis not present

## 2021-05-11 DIAGNOSIS — R972 Elevated prostate specific antigen [PSA]: Secondary | ICD-10-CM

## 2021-05-11 DIAGNOSIS — Z0000 Encounter for general adult medical examination without abnormal findings: Secondary | ICD-10-CM | POA: Diagnosis not present

## 2021-05-11 DIAGNOSIS — E1121 Type 2 diabetes mellitus with diabetic nephropathy: Secondary | ICD-10-CM

## 2021-05-11 DIAGNOSIS — R0609 Other forms of dyspnea: Secondary | ICD-10-CM

## 2021-05-11 LAB — CBC WITH DIFFERENTIAL/PLATELET
Basophils Absolute: 0.1 10*3/uL (ref 0.0–0.1)
Basophils Relative: 0.9 % (ref 0.0–3.0)
Eosinophils Absolute: 0.1 10*3/uL (ref 0.0–0.7)
Eosinophils Relative: 1.7 % (ref 0.0–5.0)
HCT: 37.4 % — ABNORMAL LOW (ref 39.0–52.0)
Hemoglobin: 12.3 g/dL — ABNORMAL LOW (ref 13.0–17.0)
Lymphocytes Relative: 18.7 % (ref 12.0–46.0)
Lymphs Abs: 1.4 10*3/uL (ref 0.7–4.0)
MCHC: 32.8 g/dL (ref 30.0–36.0)
MCV: 86 fl (ref 78.0–100.0)
Monocytes Absolute: 0.5 10*3/uL (ref 0.1–1.0)
Monocytes Relative: 7 % (ref 3.0–12.0)
Neutro Abs: 5.4 10*3/uL (ref 1.4–7.7)
Neutrophils Relative %: 71.7 % (ref 43.0–77.0)
Platelets: 305 10*3/uL (ref 150.0–400.0)
RBC: 4.35 Mil/uL (ref 4.22–5.81)
RDW: 16.1 % — ABNORMAL HIGH (ref 11.5–15.5)
WBC: 7.6 10*3/uL (ref 4.0–10.5)

## 2021-05-11 LAB — COMPREHENSIVE METABOLIC PANEL
ALT: 36 U/L (ref 0–53)
AST: 26 U/L (ref 0–37)
Albumin: 4.3 g/dL (ref 3.5–5.2)
Alkaline Phosphatase: 54 U/L (ref 39–117)
BUN: 19 mg/dL (ref 6–23)
CO2: 25 mEq/L (ref 19–32)
Calcium: 11.1 mg/dL — ABNORMAL HIGH (ref 8.4–10.5)
Chloride: 107 mEq/L (ref 96–112)
Creatinine, Ser: 1.27 mg/dL (ref 0.40–1.50)
GFR: 57.01 mL/min — ABNORMAL LOW (ref >60.00)
Glucose, Bld: 163 mg/dL — ABNORMAL HIGH (ref 70–99)
Potassium: 4.1 mEq/L (ref 3.5–5.1)
Sodium: 143 mEq/L (ref 135–145)
Total Bilirubin: 0.4 mg/dL (ref 0.2–1.2)
Total Protein: 7.1 g/dL (ref 6.0–8.3)

## 2021-05-11 LAB — LIPID PANEL
Cholesterol: 152 mg/dL (ref 0–200)
HDL: 41.3 mg/dL (ref >39.00)
LDL Cholesterol: 84 mg/dL (ref 0–99)
NonHDL: 110.54
Total CHOL/HDL Ratio: 4
Triglycerides: 131 mg/dL (ref 0.0–149.0)
VLDL: 26.2 mg/dL (ref 0.0–40.0)

## 2021-05-11 LAB — URIC ACID: Uric Acid, Serum: 5.1 mg/dL (ref 4.0–7.8)

## 2021-05-11 LAB — PSA: PSA: 1.67 ng/mL (ref 0.10–4.00)

## 2021-05-11 LAB — MICROALBUMIN / CREATININE URINE RATIO
Creatinine,U: 13.2 mg/dL
Microalb Creat Ratio: 5.3 mg/g (ref 0.0–30.0)
Microalb, Ur: <0.7 mg/dL (ref 0.0–1.9)

## 2021-05-11 LAB — HEMOGLOBIN A1C: Hgb A1c MFr Bld: 7.5 % — ABNORMAL HIGH (ref 4.6–6.5)

## 2021-05-11 MED ORDER — TADALAFIL 20 MG PO TABS: 10.0000 mg | ORAL_TABLET | ORAL | 11 refills | Status: DC | PRN

## 2021-05-11 MED ORDER — DAPAGLIFLOZIN PROPANEDIOL 5 MG PO TABS: 5.0000 mg | ORAL_TABLET | Freq: Every day | ORAL | 1 refills | Status: DC

## 2021-05-11 NOTE — Assessment & Plan Note (Signed)
Per cardiology 

## 2021-05-11 NOTE — Assessment & Plan Note (Signed)
hgba1c to be checked, minimize simple carbs. Increase exercise as tolerated. Continue current meds  
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
## 2021-05-11 NOTE — Patient Instructions (Signed)
Preventive Care 65 Years and Older, Male Preventive care refers to lifestyle choices and visits with your health care provider that can promote health and wellness. This includes:  A yearly physical exam. This is also called an annual wellness visit.  Regular dental and eye exams.  Immunizations.  Screening for certain conditions.  Healthy lifestyle choices, such as: ? Eating a healthy diet. ? Getting regular exercise. ? Not using drugs or products that contain nicotine and tobacco. ? Limiting alcohol use. What can I expect for my preventive care visit? Physical exam Your health care provider will check your:  Height and weight. These may be used to calculate your BMI (body mass index). BMI is a measurement that tells if you are at a healthy weight.  Heart rate and blood pressure.  Body temperature.  Skin for abnormal spots. Counseling Your health care provider may ask you questions about your:  Past medical problems.  Family's medical history.  Alcohol, tobacco, and drug use.  Emotional well-being.  Home life and relationship well-being.  Sexual activity.  Diet, exercise, and sleep habits.  History of falls.  Memory and ability to understand (cognition).  Work and work environment.  Access to firearms. What immunizations do I need? Vaccines are usually given at various ages, according to a schedule. Your health care provider will recommend vaccines for you based on your age, medical history, and lifestyle or other factors, such as travel or where you work.   What tests do I need? Blood tests  Lipid and cholesterol levels. These may be checked every 5 years, or more often depending on your overall health.  Hepatitis C test.  Hepatitis B test. Screening  Lung cancer screening. You may have this screening every year starting at age 55 if you have a 30-pack-year history of smoking and currently smoke or have quit within the past 15 years.  Colorectal  cancer screening. ? All adults should have this screening starting at age 50 and continuing until age 75. ? Your health care provider may recommend screening at age 45 if you are at increased risk. ? You will have tests every 1-10 years, depending on your results and the type of screening test.  Prostate cancer screening. Recommendations will vary depending on your family history and other risks.  Genital exam to check for testicular cancer or hernias.  Diabetes screening. ? This is done by checking your blood sugar (glucose) after you have not eaten for a while (fasting). ? You may have this done every 1-3 years.  Abdominal aortic aneurysm (AAA) screening. You may need this if you are a current or former smoker.  STD (sexually transmitted disease) testing, if you are at risk. Follow these instructions at home: Eating and drinking  Eat a diet that includes fresh fruits and vegetables, whole grains, lean protein, and low-fat dairy products. Limit your intake of foods with high amounts of sugar, saturated fats, and salt.  Take vitamin and mineral supplements as recommended by your health care provider.  Do not drink alcohol if your health care provider tells you not to drink.  If you drink alcohol: ? Limit how much you have to 0-2 drinks a day. ? Be aware of how much alcohol is in your drink. In the [place] one drink equals one 12 oz bottle of beer (355 mL), one 5 oz glass of wine (148 mL), or one 1 oz glass of hard liquor (44 mL).   Lifestyle  Take daily care of your teeth   and gums. Brush your teeth every morning and night with fluoride toothpaste. Floss one time each day.  Stay active. Exercise for at least 30 minutes 5 or more days each week.  Do not use any products that contain nicotine or tobacco, such as cigarettes, e-cigarettes, and chewing tobacco. If you need help quitting, ask your health care provider.  Do not use drugs.  If you are sexually active, practice safe sex.  Use a condom or other form of protection to prevent STIs (sexually transmitted infections).  Talk with your health care provider about taking a low-dose aspirin or statin.  Find healthy ways to cope with stress, such as: ? Meditation, yoga, or listening to music. ? Journaling. ? Talking to a trusted person. ? Spending time with friends and family. Safety  Always wear your seat belt while driving or riding in a vehicle.  Do not drive: ? If you have been drinking alcohol. Do not ride with someone who has been drinking. ? When you are tired or distracted. ? While texting.  Wear a helmet and other protective equipment during sports activities.  If you have firearms in your house, make sure you follow all gun safety procedures. What's next?  Visit your health care provider once a year for an annual wellness visit.  Ask your health care provider how often you should have your eyes and teeth checked.  Stay up to date on all vaccines. This information is not intended to replace advice given to you by your health care provider. Make sure you discuss any questions you have with your health care provider. Document Revised: 09/07/2019 Document Reviewed: 12/03/2018 Elsevier Patient Education  2021 [employer]vier Inc.  

## 2021-05-11 NOTE — Assessment & Plan Note (Signed)
On xaralto 

## 2021-05-11 NOTE — Assessment & Plan Note (Signed)
Cardiology waiting on approval for cath Pt may need in network referral  He will call and let us know

## 2021-05-11 NOTE — Assessment & Plan Note (Signed)
ghm utd Check labs  See avs  

## 2021-05-11 NOTE — Assessment & Plan Note (Signed)
Recheck labs today. 

## 2021-05-11 NOTE — Assessment & Plan Note (Signed)
Well controlled, no changes to meds. Encouraged heart healthy diet such as the DASH diet and exercise as tolerated.  °

## 2021-05-12 ENCOUNTER — Other Ambulatory Visit: Payer: Self-pay | Admitting: Neurology

## 2021-05-16 ENCOUNTER — Other Ambulatory Visit (INDEPENDENT_AMBULATORY_CARE_PROVIDER_SITE_OTHER): Payer: Medicare Other

## 2021-05-16 ENCOUNTER — Other Ambulatory Visit: Payer: Self-pay

## 2021-05-17 LAB — PTH, INTACT AND CALCIUM
Calcium: 9.6 mg/dL (ref 8.6–10.3)
PTH: 14 pg/mL — ABNORMAL LOW (ref 16–77)

## 2021-05-19 ENCOUNTER — Other Ambulatory Visit: Payer: Self-pay | Admitting: Internal Medicine

## 2021-05-29 DIAGNOSIS — G4733 Obstructive sleep apnea (adult) (pediatric): Secondary | ICD-10-CM | POA: Diagnosis not present

## 2021-06-06 ENCOUNTER — Encounter: Payer: Self-pay | Admitting: Podiatry

## 2021-06-06 ENCOUNTER — Ambulatory Visit: Payer: Medicare Other | Admitting: Podiatry

## 2021-06-06 ENCOUNTER — Other Ambulatory Visit: Payer: Self-pay

## 2021-06-06 DIAGNOSIS — E1121 Type 2 diabetes mellitus with diabetic nephropathy: Secondary | ICD-10-CM | POA: Diagnosis not present

## 2021-06-06 DIAGNOSIS — I872 Venous insufficiency (chronic) (peripheral): Secondary | ICD-10-CM

## 2021-06-06 DIAGNOSIS — M79675 Pain in left toe(s): Secondary | ICD-10-CM

## 2021-06-06 DIAGNOSIS — B351 Tinea unguium: Secondary | ICD-10-CM

## 2021-06-06 DIAGNOSIS — M79674 Pain in right toe(s): Secondary | ICD-10-CM

## 2021-06-06 NOTE — Progress Notes (Signed)
This patient returns to my office for at risk foot care.  This patient requires this care by a professional since this patient will be at risk due to having diabetes and coagulation defect. This patient is unable to cut nails himself since the patient cannot reach his nails.These nails are painful walking and wearing shoes.  This patient presents for at risk foot care today.  General Appearance  Alert, conversant and in no acute stress.  Vascular  Dorsalis pedis and posterior tibial  pulses are palpable  bilaterally.  Capillary return is within normal limits  bilaterally. Temperature is within normal limits  bilaterally.  Neurologic  Senn-Weinstein monofilament wire test diminished/absent bilaterally. Muscle power within normal limits bilaterally.  Nails Thick disfigured discolored nails with subungual debris  from hallux to fifth toes bilaterally. No evidence of bacterial infection or drainage bilaterally.  Orthopedic  No limitations of motion  feet .  No crepitus or effusions noted.  No bony pathology or digital deformities noted.  Skin  normotropic skin with no porokeratosis noted bilaterally.  No signs of infections or ulcers noted.     Onychomycosis  Pain in right toes  Pain in left toes  Consent was obtained for treatment procedures.   Mechanical debridement of nails 1-5  bilaterally performed with a nail nipper.  Filed with dremel without incident.    Return office visit    3 months                 Told patient to return for periodic foot care and evaluation due to potential at risk complications.   Gardiner Barefoot DPM

## 2021-06-07 ENCOUNTER — Encounter: Payer: Self-pay | Admitting: Family Medicine

## 2021-06-07 DIAGNOSIS — E1165 Type 2 diabetes mellitus with hyperglycemia: Secondary | ICD-10-CM

## 2021-06-07 MED ORDER — DAPAGLIFLOZIN PROPANEDIOL 10 MG PO TABS: 10.0000 mg | ORAL_TABLET | Freq: Every day | ORAL | 2 refills | Status: DC

## 2021-06-11 ENCOUNTER — Telehealth: Payer: Self-pay | Admitting: Pharmacist

## 2021-06-11 DIAGNOSIS — I2510 Atherosclerotic heart disease of native coronary artery without angina pectoris: Secondary | ICD-10-CM | POA: Diagnosis not present

## 2021-06-11 DIAGNOSIS — I5032 Chronic diastolic (congestive) heart failure: Secondary | ICD-10-CM | POA: Diagnosis not present

## 2021-06-11 DIAGNOSIS — I110 Hypertensive heart disease with heart failure: Secondary | ICD-10-CM | POA: Diagnosis not present

## 2021-06-11 DIAGNOSIS — R0600 Dyspnea, unspecified: Secondary | ICD-10-CM | POA: Diagnosis not present

## 2021-06-11 DIAGNOSIS — Z87891 Personal history of nicotine dependence: Secondary | ICD-10-CM | POA: Diagnosis not present

## 2021-06-11 DIAGNOSIS — R9431 Abnormal electrocardiogram [ECG] [EKG]: Secondary | ICD-10-CM | POA: Diagnosis not present

## 2021-06-11 DIAGNOSIS — R9439 Abnormal result of other cardiovascular function study: Secondary | ICD-10-CM | POA: Diagnosis not present

## 2021-06-11 NOTE — Chronic Care Management (AMB) (Signed)
Chronic Care Management Pharmacy Assistant   Name: Garrett [name]  MRN: [id number] DOB: 09/26/1951   Reason for Encounter: [place] office visits:  05/11/21 [name] PCP- Annual visit. Start farxiga 5 mg.  Recent consult visits:  06/06/21 Gardiner Barefoot, DPM Podiatry- Diabetes foot follow up.  04/20/21 Oren Bracket [facility]  03/02/21 Lorel Monaco, DPM Podiatry- Follow up diabetic foot care  01/17/21 Ila Mcgill, DPM Podiatry- Left foot pain.  12/26/20 [facility] visits:  Medication Reconciliation was completed by comparing discharge summary, patient's EMR and Pharmacy list, and upon discussion with patient.  Admitted to the hospital on 02/20/21 due to Hiatal hernia. Discharge date was 02/27/21. Discharged from [facility]?Medications Started at [facility] Discharge:?? -started none  Medication Changes at Hospital Discharge: -Changed none  Medications Discontinued at Hospital Discharge: -Stopped none  Medications that remain the same after Hospital Discharge:??  -All other medications will remain the same.    Medications: Outpatient Encounter Medications as of 06/11/2021  Medication Sig   albuterol (PROVENTIL) (2.5 MG/3ML) 0.083% nebulizer solution Take 3 mLs (2.5 mg total) by nebulization every 6 (six) hours as needed for wheezing or shortness of breath.   allopurinol (ZYLOPRIM) 100 MG tablet TAKE 1 TABLET BY MOUTH  DAILY   atorvastatin (LIPITOR) 40 MG tablet Take 40 mg by mouth every evening.   atorvastatin (LIPITOR) 40 MG tablet Take 1 tablet by mouth daily.   budesonide-formoterol (SYMBICORT) 80-4.5 MCG/ACT inhaler Inhale 2 puffs into the lungs daily.   celecoxib (CELEBREX) 200 MG capsule TAKE 1 CAPSULE BY MOUTH  TWICE DAILY (Patient taking differently: Take 400 mg by mouth daily.)   dapagliflozin propanediol (FARXIGA) 10 MG TABS tablet Take 1 tablet (10 mg total) by mouth  daily before breakfast.   EPINEPHrine 0.3 mg/0.3 mL IJ SOAJ injection Inject 0.3 mg into the muscle as needed for anaphylaxis.    famotidine (PEPCID) 20 MG tablet Take 1 tablet (20 mg total) by mouth at bedtime.   fenofibrate 160 MG tablet TAKE 1 TABLET BY MOUTH  DAILY   fluticasone (FLONASE) 50 MCG/ACT nasal spray Place 2 sprays into both nostrils daily. (Patient taking differently: Place 2 sprays into both nostrils daily as needed for allergies.)   furosemide (LASIX) 40 MG tablet Take 40 mg by mouth daily.   gabapentin (NEURONTIN) 100 MG capsule TAKE 2 CAPSULES BY MOUTH AT BEDTIME (Patient taking differently: Take 200 mg by mouth at bedtime.)   levocetirizine (XYZAL) 5 MG tablet Take 1 tablet (5 mg total) by mouth every evening.   magnesium oxide (MAG-OX) 400 (241.3 Mg) MG tablet Take 1 tablet (400 mg total) by mouth daily.   meclizine (ANTIVERT) 25 MG tablet Take 1 tablet (25 mg total) by mouth 3 (three) times daily as needed for dizziness. (Patient taking differently: Take 25 mg by mouth as needed for dizziness.)   memantine (NAMENDA) 10 MG tablet TAKE 2 TABLETS BY MOUTH  DAILY (Patient taking differently: Take 20 mg by mouth daily.)   metFORMIN (GLUCOPHAGE) 500 MG tablet TAKE 1 TABLET BY MOUTH  TWICE DAILY WITH A MEAL (Patient taking differently: Take 500 mg by mouth 2 (two) times daily.)   metoprolol tartrate (LOPRESSOR) 25 MG tablet TAKE 1 TABLET BY MOUTH  TWICE DAILY   Multiple Vitamins-Minerals (ONE-A-DAY WEIGHT SMART ADVANCE PO) Take 1 tablet by mouth daily. Centrum Silver   nitroGLYCERIN (NITROSTAT) 0.4 MG SL tablet  Place 0.4 mg under the tongue every 5 (five) minutes as needed for chest pain.   ondansetron (ZOFRAN-ODT) 4 MG disintegrating tablet DISSOLVE 1 TABLET IN MOUTH EVERY 6 HOURS AS NEEDED FOR NAUSEA   ONETOUCH ULTRA test strip USE AS DIRECTED THREE TIMES DAILY.   pantoprazole (PROTONIX) 40 MG tablet Take by mouth.   potassium chloride SA (KLOR-CON) 20 MEQ tablet TAKE 2 TABLETS BY  MOUTH  DAILY   PROAIR HFA 108 (90 Base) MCG/ACT inhaler Inhale 1 puff into the lungs every 6 (six) hours as needed for wheezing or shortness of breath. (Patient taking differently: Inhale 1-2 puffs into the lungs every 6 (six) hours as needed for wheezing or shortness of breath.)   SUMAtriptan (IMITREX) 50 MG tablet Take 1 tablet as needed for migraine/vertigo. Do not take more than 3 a week (Patient taking differently: Take 50 mg by mouth every 2 (two) hours as needed for migraine. Max 3 tabs per week)   tadalafil (CIALIS) 20 MG tablet Take 0.5-1 tablets (10-20 mg total) by mouth every other day as needed for erectile dysfunction.   topiramate (TOPAMAX) 50 MG tablet TAKE 1 TABLET BY MOUTH  TWICE DAILY   verapamil (CALAN-SR) 240 MG CR tablet TAKE 1 TABLET BY MOUTH AT  BEDTIME (Patient taking differently: Take 240 mg by mouth at bedtime.)   vitamin B-12 (CYANOCOBALAMIN) 1000 MCG tablet Take 1,000 mcg by mouth daily.   XARELTO 20 MG TABS tablet TAKE 1 TABLET BY MOUTH  DAILY WITH SUPPER   No facility-administered encounter medications on file as of 06/11/2021.    Have you had any problems recently with your health? Patient states he is not having any new concerns at this time.  Have you had any problems with your pharmacy? Patient states he is not having any problems with his pharmacy.  What issues or side effects are you having with your medications? Patient states he is not having any issues or side effects that he knows of.  What would you like me to pass along to [facility] for them to help you with?  Patient states there is nothing at this time.  What can we do to take care of you better? Patient states there is nothing at this time.  Star Rating Drugs: Farxiga 10 mg last filled 05/11/21 30 DS Atorvastatin 40 mg last filled 05/09/21 90 DS Verapamil 240 mg last filled 05/09/21 90 DS Metformin 500 mg last filled 04/27/21 90 DS  [facility] Clinical Pharmacist  Assistant [phone]

## 2021-06-22 HISTORY — PX: CARDIAC CATHETERIZATION: SHX172

## 2021-06-27 ENCOUNTER — Encounter: Payer: Self-pay | Admitting: Neurology

## 2021-06-27 ENCOUNTER — Ambulatory Visit: Payer: Medicare Other | Admitting: Neurology

## 2021-06-27 ENCOUNTER — Other Ambulatory Visit: Payer: Self-pay

## 2021-06-27 VITALS — BP 131/79 | HR 75 | Ht 69.0 in | Wt 226.0 lb

## 2021-06-27 DIAGNOSIS — G629 Polyneuropathy, unspecified: Secondary | ICD-10-CM

## 2021-06-27 DIAGNOSIS — G43109 Migraine with aura, not intractable, without status migrainosus: Secondary | ICD-10-CM | POA: Diagnosis not present

## 2021-06-27 DIAGNOSIS — M21371 Foot drop, right foot: Secondary | ICD-10-CM | POA: Diagnosis not present

## 2021-06-27 MED ORDER — TOPIRAMATE 50 MG PO TABS: 50.0000 mg | ORAL_TABLET | Freq: Two times a day (BID) | ORAL | 3 refills | Status: DC
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
## 2021-06-27 NOTE — Patient Instructions (Signed)
Continue Topamax 50mg  twice a day  2. Referral will be sent to Physical Therapy for the right foot drop and balance  3. Follow-up in 1 year, call for any changes
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
## 2021-07-02 ENCOUNTER — Ambulatory Visit (INDEPENDENT_AMBULATORY_CARE_PROVIDER_SITE_OTHER): Payer: Medicare Other

## 2021-07-02 VITALS — Ht 69.0 in | Wt 226.0 lb

## 2021-07-02 DIAGNOSIS — Z0000 Encounter for general adult medical examination without abnormal findings: Secondary | ICD-10-CM

## 2021-07-02 NOTE — Patient Instructions (Signed)
Garrett [name] , Thank you for taking time to complete your Medicare Wellness Visit. I appreciate your ongoing commitment to your health goals. Please review the following plan we discussed and let me know if I can assist you in the future.   Screening recommendations/referrals: Colonoscopy: Due-Per our conversation,you will discuss with GI at your next appt. Recommended yearly ophthalmology/optometry visit for glaucoma screening and checkup Recommended yearly dental visit for hygiene and checkup  Vaccinations: Influenza vaccine: Up to date Pneumococcal vaccine: Up to date Tdap vaccine: Up to date-Due 07/21/2029 Shingles vaccine: Discuss with pharmacy   Covid-19: 2nd booster due-May obtain vaccine at your local pharmacy  Advanced directives: Declined information today.  Conditions/risks identified: See problem list  Next appointment: Follow up in one year for your annual wellness visit. 07/04/2022 @ 11:00.  Preventive Care 25 Years and Older, Male Preventive care refers to lifestyle choices and visits with your health care provider that can promote health and wellness. What does preventive care include? A yearly physical exam. This is also called an annual well check. Dental exams once or twice a year. Routine eye exams. Ask your health care provider how often you should have your eyes checked. Personal lifestyle choices, including: Daily care of your teeth and gums. Regular physical activity. Eating a healthy diet. Avoiding tobacco and drug use. Limiting alcohol use. Practicing safe sex. Taking low doses of aspirin every day. Taking vitamin and mineral supplements as recommended by your health care provider. What happens during an annual well check? The services and screenings done by your health care provider during your annual well check will depend on your age, overall health, lifestyle risk factors, and family history of disease. Counseling  Your health care provider may ask you  questions about your: Alcohol use. Tobacco use. Drug use. Emotional well-being. Home and relationship well-being. Sexual activity. Eating habits. History of falls. Memory and ability to understand (cognition). Work and work Statistician. Screening  You may have the following tests or measurements: Height, weight, and BMI. Blood pressure. Lipid and cholesterol levels. These may be checked every 5 years, or more frequently if you are over 71 years old. Skin check. Lung cancer screening. You may have this screening every year starting at age 71 if you have a 30-pack-year history of smoking and currently smoke or have quit within the past 15 years. Fecal occult blood test (FOBT) of the stool. You may have this test every year starting at age 71. Flexible sigmoidoscopy or colonoscopy. You may have a sigmoidoscopy every 5 years or a colonoscopy every 10 years starting at age 71. Prostate cancer screening. Recommendations will vary depending on your family history and other risks. Hepatitis C blood test. Hepatitis B blood test. Sexually transmitted disease (STD) testing. Diabetes screening. This is done by checking your blood sugar (glucose) after you have not eaten for a while (fasting). You may have this done every 1-3 years. Abdominal aortic aneurysm (AAA) screening. You may need this if you are a current or former smoker. Osteoporosis. You may be screened starting at age 71 if you are at high risk. Talk with your health care provider about your test results, treatment options, and if necessary, the need for more tests. Vaccines  Your health care provider may recommend certain vaccines, such as: Influenza vaccine. This is recommended every year. Tetanus, diphtheria, and acellular pertussis (Tdap, Td) vaccine. You may need a Td booster every 10 years. Zoster vaccine. You may need this after age 71. Pneumococcal 13-valent conjugate (  PCV13) vaccine. One dose is recommended after age  71. Pneumococcal polysaccharide (PPSV23) vaccine. One dose is recommended after age 71. Talk to your health care provider about which screenings and vaccines you need and how often you need them. This information is not intended to replace advice given to you by your health care provider. Make sure you discuss any questions you have with your health care provider. Document Released: 01/05/2016 Document Revised: 08/28/2016 Document Reviewed: 10/10/2015 Elsevier Interactive Patient Education  2017 [employer] Prevention in the Home Falls can cause injuries. They can happen to people of all ages. There are many things you can do to make your home safe and to help prevent falls. What can I do on the outside of my home? Regularly fix the edges of walkways and driveways and fix any cracks. Remove anything that might make you trip as you walk through a door, such as a raised step or threshold. Trim any bushes or trees on the path to your home. Use bright outdoor lighting. Clear any walking paths of anything that might make someone trip, such as rocks or tools. Regularly check to see if handrails are loose or broken. Make sure that both sides of any steps have handrails. Any raised decks and porches should have guardrails on the edges. Have any leaves, snow, or ice cleared regularly. Use sand or salt on walking paths during winter. Clean up any spills in your garage right away. This includes oil or grease spills. What can I do in the bathroom? Use night lights. Install grab bars by the toilet and in the tub and shower. Do not use towel bars as grab bars. Use non-skid mats or decals in the tub or shower. If you need to sit down in the shower, use a plastic, non-slip stool. Keep the floor dry. Clean up any water that spills on the floor as soon as it happens. Remove soap buildup in the tub or shower regularly. Attach bath mats securely with double-sided non-slip rug tape. Do not have throw  rugs and other things on the floor that can make you trip. What can I do in the bedroom? Use night lights. Make sure that you have a light by your bed that is easy to reach. Do not use any sheets or blankets that are too big for your bed. They should not hang down onto the floor. Have a firm chair that has side arms. You can use this for support while you get dressed. Do not have throw rugs and other things on the floor that can make you trip. What can I do in the kitchen? Clean up any spills right away. Avoid walking on wet floors. Keep items that you use a lot in easy-to-reach places. If you need to reach something above you, use a strong step stool that has a grab bar. Keep electrical cords out of the way. Do not use floor polish or wax that makes floors slippery. If you must use wax, use non-skid floor wax. Do not have throw rugs and other things on the floor that can make you trip. What can I do with my stairs? Do not leave any items on the stairs. Make sure that there are handrails on both sides of the stairs and use them. Fix handrails that are broken or loose. Make sure that handrails are as long as the stairways. Check any carpeting to make sure that it is firmly attached to the stairs. Fix any carpet that is  loose or worn. Avoid having throw rugs at the top or bottom of the stairs. If you do have throw rugs, attach them to the floor with carpet tape. Make sure that you have a light switch at the top of the stairs and the bottom of the stairs. If you do not have them, ask someone to add them for you. What else can I do to help prevent falls? Wear shoes that: Do not have high heels. Have rubber bottoms. Are comfortable and fit you well. Are closed at the toe. Do not wear sandals. If you use a stepladder: Make sure that it is fully opened. Do not climb a closed stepladder. Make sure that both sides of the stepladder are locked into place. Ask someone to hold it for you, if  possible. Clearly mark and make sure that you can see: Any grab bars or handrails. First and last steps. Where the edge of each step is. Use tools that help you move around (mobility aids) if they are needed. These include: Canes. Walkers. Scooters. Crutches. Turn on the lights when you go into a dark area. Replace any light bulbs as soon as they burn out. Set up your furniture so you have a clear path. Avoid moving your furniture around. If any of your floors are uneven, fix them. If there are any pets around you, be aware of where they are. Review your medicines with your doctor. Some medicines can make you feel dizzy. This can increase your chance of falling. Ask your doctor what other things that you can do to help prevent falls. This information is not intended to replace advice given to you by your health care provider. Make sure you discuss any questions you have with your health care provider. Document Released: 10/05/2009 Document Revised: 05/16/2016 Document Reviewed: 01/13/2015 Elsevier Interactive Patient Education  2017 [employer].

## 2021-07-04 ENCOUNTER — Ambulatory Visit: Payer: Medicare Other | Attending: Neurology | Admitting: Physical Therapy

## 2021-07-04 ENCOUNTER — Other Ambulatory Visit: Payer: Self-pay

## 2021-07-04 DIAGNOSIS — M6281 Muscle weakness (generalized): Secondary | ICD-10-CM | POA: Diagnosis not present

## 2021-07-04 DIAGNOSIS — R2681 Unsteadiness on feet: Secondary | ICD-10-CM | POA: Diagnosis not present

## 2021-07-04 DIAGNOSIS — M5441 Lumbago with sciatica, right side: Secondary | ICD-10-CM | POA: Insufficient documentation

## 2021-07-04 DIAGNOSIS — R2689 Other abnormalities of gait and mobility: Secondary | ICD-10-CM | POA: Diagnosis not present

## 2021-07-04 DIAGNOSIS — R42 Dizziness and giddiness: Secondary | ICD-10-CM | POA: Diagnosis not present

## 2021-07-04 DIAGNOSIS — G8929 Other chronic pain: Secondary | ICD-10-CM | POA: Insufficient documentation
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
## 2021-07-05 ENCOUNTER — Encounter: Payer: Self-pay | Admitting: Nurse Practitioner

## 2021-07-05 ENCOUNTER — Other Ambulatory Visit (INDEPENDENT_AMBULATORY_CARE_PROVIDER_SITE_OTHER): Payer: Medicare Other

## 2021-07-05 ENCOUNTER — Ambulatory Visit: Payer: Medicare Other | Admitting: Nurse Practitioner

## 2021-07-05 ENCOUNTER — Telehealth: Payer: Self-pay

## 2021-07-05 VITALS — BP 124/72 | HR 85 | Ht 69.0 in | Wt 224.8 lb

## 2021-07-05 DIAGNOSIS — Z86010 Personal history of colonic polyps: Secondary | ICD-10-CM | POA: Diagnosis not present

## 2021-07-05 DIAGNOSIS — R1310 Dysphagia, unspecified: Secondary | ICD-10-CM | POA: Diagnosis not present

## 2021-07-05 DIAGNOSIS — D649 Anemia, unspecified: Secondary | ICD-10-CM

## 2021-07-05 LAB — CBC
HCT: 36.2 % — ABNORMAL LOW (ref 39.0–52.0)
Hemoglobin: 12 g/dL — ABNORMAL LOW (ref 13.0–17.0)
MCHC: 33.1 g/dL (ref 30.0–36.0)
MCV: 84.6 fl (ref 78.0–100.0)
Platelets: 300 10*3/uL (ref 150.0–400.0)
RBC: 4.28 Mil/uL (ref 4.22–5.81)
RDW: 14.7 % (ref 11.5–15.5)
WBC: 8.5 10*3/uL (ref 4.0–10.5)

## 2021-07-05 NOTE — Patient Instructions (Addendum)
PROCEDURES: You have been scheduled for an EGD and Colonoscopy. Please follow the written instructions given to you at your visit today. If you use inhalers (even only as needed), please bring them with you on the day of your procedure.  LABS:  Lab work has been ordered for you today. Our lab is located in the basement. Press "B" on the elevator. The lab is located at the first door on the left as you exit the elevator.  HEALTHCARE LAWS AND MY CHART RESULTS: Due to recent changes in healthcare laws, you may see the results of your imaging and laboratory studies on MyChart before your provider has had a chance to review them.   We understand that in some cases there may be results that are confusing or concerning to you. Not all laboratory results come back in the same time frame and the provider may be waiting for multiple results in order to interpret others.  Please give [place] 48 hours in order for your provider to thoroughly review all the results before contacting the office for clarification of your results.   You will be contacted by our office prior to your procedure for directions on holding your Xarelto.  If you do not hear from our office 1 week prior to your scheduled procedure, please call [phone] to discuss.   RECOMMENDATIONS: Eat 3-4 small meals. Cut food in small pieces. Chew food throughly. Drink 3 sips of water prior to swallowing any food or tablets.  It was great seeing you today! Thank you for entrusting me with your care and choosing [facility].  Noralyn Pick, CRNP   The [facility] providers would like to encourage you to use [facility] to communicate with providers for non-urgent requests or questions.  Due to long hold times on the telephone, sending your provider a message by [facility] may be faster and more efficient way to get a response. Please allow 48 business hours for a response.  Please remember that this is for non-urgent  requests/questions.  If you are age 3 or older, your body mass index should be between 23-30. Your Body mass index is 33.2 kg/m. If this is out of the aforementioned range listed, please consider follow up with your Primary Care Provider.

## 2021-07-05 NOTE — Telephone Encounter (Signed)
Request for surgical clearance:     Endoscopy Procedure  What type of surgery is being performed?     Colonoscopy and EGD  When is this surgery scheduled?     08/14/21  What type of clearance is required ?   Pharmacy  Are there any medications that need to be held prior to surgery and how long? 3-5 day hold on Xarelto and need to know if he will need a Lovenox bridge.  Practice name and name of physician performing surgery?      [facility]  What is your office phone and fax number?      Phone- [phone]  [phone]  Anesthesia type (None, local, MAC, general) ?       MAC

## 2021-07-06 ENCOUNTER — Ambulatory Visit: Payer: Medicare Other | Admitting: Physical Therapy

## 2021-07-06 ENCOUNTER — Other Ambulatory Visit: Payer: Self-pay

## 2021-07-06 DIAGNOSIS — G8929 Other chronic pain: Secondary | ICD-10-CM

## 2021-07-06 DIAGNOSIS — M6281 Muscle weakness (generalized): Secondary | ICD-10-CM | POA: Diagnosis not present

## 2021-07-06 DIAGNOSIS — R42 Dizziness and giddiness: Secondary | ICD-10-CM | POA: Diagnosis not present

## 2021-07-06 DIAGNOSIS — M5441 Lumbago with sciatica, right side: Secondary | ICD-10-CM

## 2021-07-06 DIAGNOSIS — R2689 Other abnormalities of gait and mobility: Secondary | ICD-10-CM

## 2021-07-06 DIAGNOSIS — R2681 Unsteadiness on feet: Secondary | ICD-10-CM | POA: Diagnosis not present

## 2021-07-06 LAB — IRON,TIBC AND FERRITIN PANEL
%SAT: 12 % (calc) — ABNORMAL LOW (ref 20–48)
Ferritin: 11 ng/mL — ABNORMAL LOW (ref 24–380)
Iron: 64 ug/dL (ref 50–180)
TIBC: 532 mcg/dL (calc) — ABNORMAL HIGH (ref 250–425)
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
## 2021-07-06 NOTE — Progress Notes (Signed)
I agree with the above note and plan.  He does not need any imaging.  His dysphagia is almost certainly from his most recent hiatal hernia repair, fundoplication wrap.  Possibly some postop edema, possibly just a little snug.  Certainly dilation attempt is reasonable.

## 2021-07-09 ENCOUNTER — Encounter: Payer: Medicare Other | Admitting: Physical Therapy

## 2021-07-09 ENCOUNTER — Other Ambulatory Visit: Payer: Self-pay

## 2021-07-09 DIAGNOSIS — D649 Anemia, unspecified: Secondary | ICD-10-CM

## 2021-07-12 ENCOUNTER — Other Ambulatory Visit: Payer: Self-pay

## 2021-07-12 ENCOUNTER — Ambulatory Visit: Payer: Medicare Other | Admitting: Physical Therapy

## 2021-07-12 DIAGNOSIS — R2689 Other abnormalities of gait and mobility: Secondary | ICD-10-CM

## 2021-07-12 DIAGNOSIS — G8929 Other chronic pain: Secondary | ICD-10-CM

## 2021-07-12 DIAGNOSIS — M5441 Lumbago with sciatica, right side: Secondary | ICD-10-CM | POA: Diagnosis not present

## 2021-07-12 DIAGNOSIS — R42 Dizziness and giddiness: Secondary | ICD-10-CM

## 2021-07-12 DIAGNOSIS — Z974 Presence of external hearing-aid: Secondary | ICD-10-CM | POA: Diagnosis not present

## 2021-07-12 DIAGNOSIS — R2681 Unsteadiness on feet: Secondary | ICD-10-CM | POA: Diagnosis not present

## 2021-07-12 DIAGNOSIS — M6281 Muscle weakness (generalized): Secondary | ICD-10-CM | POA: Diagnosis not present

## 2021-07-12 DIAGNOSIS — H903 Sensorineural hearing loss, bilateral: Secondary | ICD-10-CM | POA: Diagnosis not present
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
## 2021-07-15 ENCOUNTER — Encounter: Payer: Self-pay | Admitting: Family Medicine

## 2021-07-16 ENCOUNTER — Other Ambulatory Visit: Payer: Self-pay

## 2021-07-16 ENCOUNTER — Ambulatory Visit: Payer: Medicare Other

## 2021-07-16 DIAGNOSIS — R2681 Unsteadiness on feet: Secondary | ICD-10-CM

## 2021-07-16 DIAGNOSIS — R42 Dizziness and giddiness: Secondary | ICD-10-CM | POA: Diagnosis not present

## 2021-07-16 DIAGNOSIS — E1165 Type 2 diabetes mellitus with hyperglycemia: Secondary | ICD-10-CM

## 2021-07-16 DIAGNOSIS — M5441 Lumbago with sciatica, right side: Secondary | ICD-10-CM | POA: Diagnosis not present

## 2021-07-16 DIAGNOSIS — R2689 Other abnormalities of gait and mobility: Secondary | ICD-10-CM | POA: Diagnosis not present

## 2021-07-16 DIAGNOSIS — M6281 Muscle weakness (generalized): Secondary | ICD-10-CM | POA: Diagnosis not present

## 2021-07-16 DIAGNOSIS — G8929 Other chronic pain: Secondary | ICD-10-CM

## 2021-07-16 NOTE — Patient Instructions (Addendum)
      Access Code: [email or web address] URL: [email or web address] Date: 07/16/2021 Prepared by: Baldomero Lamy  Exercises Clamshell with Resistance - 1 x daily - 3 x weekly - 2 sets - 10 reps Supine Bridge - 1 x daily - 3 x weekly - 2 sets - 10 reps Seated Ankle Eversion with Resistance - 1 x daily - 7 x weekly - 2 sets - 10 reps Ankle Inversion Eversion Towel Slide - 1 x daily - 7 x weekly - 2 sets - 10 reps Seated Ankle Plantar Flexion with Resistance Loop - 1 x daily - 7 x weekly - 2 sets - 10 reps Supine 90/90 Alternating Heel Touches with Posterior Pelvic Tilt - 1 x daily - 7 x weekly - 2 sets - 10 reps Standing with Head Rotation - 1 x daily - 7 x weekly - 2 sets - 10 reps Standing with Head Nod - 1 x daily - 7 x weekly - 2 sets - 10 reps Standing Gastroc Stretch at Counter - 1 x daily - 7 x weekly - 1 sets - 3 reps - 30 seconds hold
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
## 2021-07-18 ENCOUNTER — Ambulatory Visit: Payer: Medicare Other

## 2021-07-18 ENCOUNTER — Other Ambulatory Visit: Payer: Self-pay

## 2021-07-18 VITALS — BP 123/85 | HR 70

## 2021-07-18 DIAGNOSIS — R42 Dizziness and giddiness: Secondary | ICD-10-CM | POA: Diagnosis not present

## 2021-07-18 DIAGNOSIS — R2681 Unsteadiness on feet: Secondary | ICD-10-CM | POA: Diagnosis not present

## 2021-07-18 DIAGNOSIS — M6281 Muscle weakness (generalized): Secondary | ICD-10-CM | POA: Diagnosis not present

## 2021-07-18 DIAGNOSIS — M5441 Lumbago with sciatica, right side: Secondary | ICD-10-CM

## 2021-07-18 DIAGNOSIS — R2689 Other abnormalities of gait and mobility: Secondary | ICD-10-CM | POA: Diagnosis not present

## 2021-07-18 DIAGNOSIS — G8929 Other chronic pain: Secondary | ICD-10-CM
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
## 2021-07-23 ENCOUNTER — Other Ambulatory Visit: Payer: Self-pay

## 2021-07-23 ENCOUNTER — Ambulatory Visit: Payer: Medicare Other | Attending: Neurology | Admitting: Physical Therapy

## 2021-07-23 DIAGNOSIS — M6281 Muscle weakness (generalized): Secondary | ICD-10-CM | POA: Insufficient documentation

## 2021-07-23 DIAGNOSIS — R2681 Unsteadiness on feet: Secondary | ICD-10-CM | POA: Insufficient documentation

## 2021-07-23 DIAGNOSIS — R2689 Other abnormalities of gait and mobility: Secondary | ICD-10-CM | POA: Insufficient documentation

## 2021-07-23 DIAGNOSIS — M5441 Lumbago with sciatica, right side: Secondary | ICD-10-CM | POA: Insufficient documentation

## 2021-07-23 DIAGNOSIS — R42 Dizziness and giddiness: Secondary | ICD-10-CM | POA: Insufficient documentation

## 2021-07-23 DIAGNOSIS — G8929 Other chronic pain: Secondary | ICD-10-CM | POA: Insufficient documentation

## 2021-07-23 NOTE — Telephone Encounter (Signed)
Routed to cardio.

## 2021-07-25 NOTE — Telephone Encounter (Signed)
Patient is on Xarelto for PE. This is managed by PCP. The CV DIV PREOP pool only include patients of [facility]. This patient belongs to [facility].   [place] signing off.

## 2021-07-26 ENCOUNTER — Ambulatory Visit: Payer: Medicare Other | Admitting: Physical Therapy

## 2021-07-26 NOTE — Telephone Encounter (Signed)
Per CE:  Linton Rump, [address] [place], [place] [address]   [phone] (Work)   [phone] (Fax)    Clearance request faxed.

## 2021-07-30 ENCOUNTER — Other Ambulatory Visit: Payer: Self-pay

## 2021-07-30 ENCOUNTER — Ambulatory Visit: Payer: Medicare Other | Admitting: Physical Therapy

## 2021-07-30 DIAGNOSIS — R42 Dizziness and giddiness: Secondary | ICD-10-CM

## 2021-07-30 DIAGNOSIS — G8929 Other chronic pain: Secondary | ICD-10-CM

## 2021-07-30 DIAGNOSIS — R2681 Unsteadiness on feet: Secondary | ICD-10-CM | POA: Diagnosis not present

## 2021-07-30 DIAGNOSIS — M6281 Muscle weakness (generalized): Secondary | ICD-10-CM

## 2021-07-30 DIAGNOSIS — M5441 Lumbago with sciatica, right side: Secondary | ICD-10-CM

## 2021-07-30 DIAGNOSIS — R2689 Other abnormalities of gait and mobility: Secondary | ICD-10-CM | POA: Diagnosis not present

## 2021-07-30 NOTE — Telephone Encounter (Signed)
Faxed received from Dr. Lamonte Sakai. Ok to hold for 3 days before procedure, does not need lovenox bridge, patient is low cardiac risk.
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
## 2021-08-01 ENCOUNTER — Ambulatory Visit: Payer: Medicare Other | Admitting: Physical Therapy

## 2021-08-01 ENCOUNTER — Other Ambulatory Visit: Payer: Self-pay

## 2021-08-01 DIAGNOSIS — G8929 Other chronic pain: Secondary | ICD-10-CM

## 2021-08-01 DIAGNOSIS — R42 Dizziness and giddiness: Secondary | ICD-10-CM

## 2021-08-01 DIAGNOSIS — R2689 Other abnormalities of gait and mobility: Secondary | ICD-10-CM | POA: Diagnosis not present

## 2021-08-01 DIAGNOSIS — M6281 Muscle weakness (generalized): Secondary | ICD-10-CM | POA: Diagnosis not present

## 2021-08-01 DIAGNOSIS — M5441 Lumbago with sciatica, right side: Secondary | ICD-10-CM | POA: Diagnosis not present

## 2021-08-01 DIAGNOSIS — R2681 Unsteadiness on feet: Secondary | ICD-10-CM | POA: Diagnosis not present
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
## 2021-08-07 ENCOUNTER — Ambulatory Visit (INDEPENDENT_AMBULATORY_CARE_PROVIDER_SITE_OTHER): Payer: Medicare Other | Admitting: Pharmacist

## 2021-08-07 DIAGNOSIS — Z7901 Long term (current) use of anticoagulants: Secondary | ICD-10-CM

## 2021-08-07 DIAGNOSIS — E1169 Type 2 diabetes mellitus with other specified complication: Secondary | ICD-10-CM

## 2021-08-07 DIAGNOSIS — E1165 Type 2 diabetes mellitus with hyperglycemia: Secondary | ICD-10-CM

## 2021-08-07 DIAGNOSIS — E785 Hyperlipidemia, unspecified: Secondary | ICD-10-CM

## 2021-08-07 DIAGNOSIS — I5032 Chronic diastolic (congestive) heart failure: Secondary | ICD-10-CM

## 2021-08-07 DIAGNOSIS — I10 Essential (primary) hypertension: Secondary | ICD-10-CM

## 2021-08-07 NOTE — Chronic Care Management (AMB) (Signed)
Chronic Care Management Pharmacy Note  08/09/2021 Name:  Garrett [name] MRN:  [id number] DOB:  20-Nov-1951  Subjective: Garrett [name] is an 70 y.o. year old male who is a primary patient of Ann Held, DO.  The CCM team was consulted for assistance with disease management and care coordination needs.    Engaged with patient by telephone for follow up visit in response to provider referral for pharmacy case management and/or care coordination services.   Consent to Services:  The patient was given information about Chronic Care Management services, agreed to services, and gave verbal consent prior to initiation of services.  Please see initial visit note for detailed documentation.   Patient Care Team: Carollee Herter, Alferd Apa, DO as PCP - General (Family Medicine) Milus Banister, MD (Gastroenterology) [name], MD (Neurosurgery) Calvert Cantor, MD as Consulting Physician (Ophthalmology) Tanda Rockers, MD as Consulting Physician (Pulmonary Disease) Cameron Sprang, MD as Consulting Physician (Neurology) [name], MD as Consulting Physician (Neurosurgery) Marlaine Hind, MD as Consulting Physician (Physical Medicine and Rehabilitation) Cherre Robins, PharmD (Pharmacist)  Recent office visits: 05/11/21 PCP (Dr. Carollee Herter)- Annual visit. Start farxiga 5 mg.  Recent consult visits: 07/05/2021 GI Berniece Pap, NP) Seen for dysphagia. Stopped pantoprazole due to patient preference.  06/27/21 - Audiology (Dr Rachell Cipro) Replace left hearing aid; 06/27/21 Neuro (Dr Delice Lesch) neuropathy and migraines. No med changes - Referral to PT for right foot drop. F/U 1 year. 06/15/21 - Phone Call - PA for atrovastatin 11m - take 2 tablets daily. quantity limit and per insurance. Changed to 872mdaily. 06/06/21 Podiatry (Dr MaPrudence Davidson- Diabetic foot care 04/20/21 Vascular Surgery (Dr ByLamonte Sakai03/11/22 Podiatry (Dr PaPosey Pronto- Follow up diabetic foot care.  Hospital visits: 02/20/2021  - to 02/27/2021 Hospital admission for hital hernia. No medication changes noted at discharge  Objective:  Lab Results  Component Value Date   CREATININE 1.27 05/11/2021   CREATININE 1.22 02/27/2021   CREATININE 1.06 02/26/2021    Lab Results  Component Value Date   HGBA1C 7.5 (H) 05/11/2021   Last diabetic Eye exam:  Lab Results  Component Value Date/Time   HMDIABEYEEXA No Retinopathy 12/21/2018 12:34 PM    Last diabetic Foot exam: No results found for: HMDIABFOOTEX      Component Value Date/Time   CHOL 152 05/11/2021 0955   CHOL 162 07/18/2015 0744   TRIG 131.0 05/11/2021 0955   TRIG 110 07/18/2015 0744   HDL 41.30 05/11/2021 0955   HDL 41 07/18/2015 0744   CHOLHDL 4 05/11/2021 0955   VLDL 26.2 05/11/2021 0955   LDLCALC 84 05/11/2021 0955   LDLCALC 129 (H) 08/17/2020 0948   LDLCALC 99 07/18/2015 0744   LDLDIRECT 104.0 02/14/2020 1025    Hepatic Function Latest Ref Rng & Units 05/11/2021 02/27/2021 02/26/2021  Total Protein 6.0 - 8.3 g/dL 7.1 6.5 6.4(L)  Albumin 3.5 - 5.2 g/dL 4.3 2.6(L) 2.5(L)  AST 0 - 37 U/L 26 78(H) 127(H)  ALT 0 - 53 U/L 36 189(H) 190(H)  Alk Phosphatase 39 - 117 U/L 54 51 51  Total Bilirubin 0.2 - 1.2 mg/dL 0.4 0.7 0.8  Bilirubin, Direct 0.0 - 0.3 mg/dL - - -    Lab Results  Component Value Date/Time   TSH 1.30 04/28/2018 10:16 AM   TSH 1.24 02/03/2017 10:50 AM    CBC Latest Ref Rng & Units 07/05/2021 05/11/2021 02/27/2021  WBC 4.0 - 10.5 K/uL 8.5 7.6 12.7(H)  Hemoglobin 13.0 - 17.0  g/dL 12.0(L) 12.3(L) 12.1(L)  Hematocrit 39.0 - 52.0 % 36.2(L) 37.4(L) 37.3(L)  Platelets 150.0 - 400.0 K/uL 300.0 305.0 370    No results found for: VD25OH  Clinical ASCVD: Yes  The 10-year ASCVD risk score Mikey Bussing DC Jr., et al., 2013) is: 35%   Values used to calculate the score:     Age: 80 years     Sex: Male     Is Non-Hispanic African American: No     Diabetic: Yes     Tobacco smoker: No     Systolic Blood Pressure: 993 mmHg     Is BP treated:  Yes     HDL Cholesterol: 41.3 mg/dL     Total Cholesterol: 152 mg/dL      Social History   Tobacco Use  Smoking Status Former   Packs/day: 3.00   Years: 30.00   Pack years: 90.00   Types: Cigarettes   Quit date: 01/09/1991   Years since quitting: 30.6  Smokeless Tobacco Former   Types: Snuff   BP Readings from Last 3 Encounters:  07/18/21 123/85  07/05/21 124/72  06/27/21 131/79   Pulse Readings from Last 3 Encounters:  07/18/21 70  07/05/21 85  06/27/21 75   Wt Readings from Last 3 Encounters:  07/05/21 224 lb 12.8 oz (102 kg)  07/02/21 226 lb (102.5 kg)  06/27/21 226 lb (102.5 kg)    Assessment: Review of patient past medical history, allergies, medications, health status, including review of consultants reports, laboratory and other test data, was performed as part of comprehensive evaluation and provision of chronic care management services.   SDOH:  (Social Determinants of Health) assessments and interventions performed:  SDOH Interventions    Flowsheet Row Most Recent Value  SDOH Interventions   Financial Strain Interventions Other (Comment)  [performed assessment for patient assistance programs for [employer], Symbicort and Xarelto. Patient is stil working and yearly household income if above patient assistance programs cut off.]       CCM Care Plan  Allergies  Allergen Reactions   Bee Venom Anaphylaxis   Garlic Swelling    Medications Reviewed Today     Reviewed by Jones Bales, PT (Physical Therapist) on 08/08/21 at [phone]  Med List Status: <None>   Medication Order Taking? Sig Documenting Provider Last Dose Status Informant  albuterol (PROVENTIL) (2.5 MG/3ML) 0.083% nebulizer solution [id number] No Take 3 mLs (2.5 mg total) by nebulization every 6 (six) hours as needed for wheezing or shortness of breath. [name] R, DO Taking Active Self  allopurinol (ZYLOPRIM) 100 MG tablet [id number] No TAKE 1 TABLET BY MOUTH  DAILY Carollee Herter, Alferd Apa, DO Taking Active   atorvastatin (LIPITOR) 80 MG tablet [id number] No Take 80 mg by mouth every evening. [provider] Taking Active Self  budesonide-formoterol (SYMBICORT) 80-4.5 MCG/ACT inhaler [id number] No Inhale 2 puffs into the lungs daily. [provider] Taking Active Self  celecoxib (CELEBREX) 200 MG capsule [id number] No TAKE 1 CAPSULE BY MOUTH  TWICE DAILY  Patient taking differently: Take 400 mg by mouth every morning.   Ann Held, DO Taking Active   dapagliflozin propanediol (FARXIGA) 10 MG TABS tablet [id number] No Take 1 tablet (10 mg total) by mouth daily before breakfast. Carollee Herter, Alferd Apa, DO Taking Active   EPINEPHrine 0.3 mg/0.3 mL IJ SOAJ injection [id number] No Inject 0.3 mg into the muscle as needed for anaphylaxis.  [provider] Taking Active Self  famotidine (PEPCID) 20 MG  tablet [id number] No Take 1 tablet (20 mg total) by mouth at bedtime. Milus Banister, MD Taking Active Self  fenofibrate 160 MG tablet [id number] No TAKE 1 TABLET BY MOUTH  DAILY Carollee Herter, Alferd Apa, DO Taking Active   fluticasone (FLONASE) 50 MCG/ACT nasal spray [id number] No Place 2 sprays into both nostrils daily.  Patient taking differently: Place 2 sprays into both nostrils daily as needed for allergies.   Ann Held, DO Taking Active Self  furosemide (LASIX) 40 MG tablet [id number] No Take 40 mg by mouth daily. 29m in the morning 20 mg in evening [provider] Taking Active Self  gabapentin (NEURONTIN) 100 MG capsule [id number] TAKE 2 CAPSULES BY MOUTH AT BEDTIME  Patient taking differently: Take 200 mg by mouth at bedtime.   SLyndal Pulley DO Taking Active   levocetirizine (XYZAL) 5 MG tablet [id number] Take 1 tablet (5 mg total) by mouth every evening. LAnn Held DO Taking Active Self  magnesium oxide (MAG-OX) 400 (241.3 Mg) MG tablet [id number] Take 1 tablet (400 mg total) by mouth daily. AReyne Dumas MD  Taking Active Self  meclizine (ANTIVERT) 25 MG tablet [id number] Take 1 tablet (25 mg total) by mouth 3 (three) times daily as needed for dizziness.  Patient taking differently: Take 25 mg by mouth as needed for dizziness.   LRoma SchanzR, DO Taking Active   memantine (NAMENDA) 10 MG tablet [id number] TAKE 2 TABLETS BY MOUTH  DAILY  Patient taking differently: Take 20 mg by mouth daily.   LRoma SchanzR, DO Taking Active   metFORMIN (GLUCOPHAGE) 500 MG tablet [id number] TAKE 1 TABLET BY MOUTH  TWICE DAILY WITH A MEAL  Patient taking differently: Take 500 mg by mouth 2 (two) times daily.   LRoma SchanzR, DO Taking Active   metoprolol tartrate (LOPRESSOR) 50 MG tablet [id number] Take 50 mg by mouth 2 (two) times daily. [provider] Taking Active   Multiple Vitamins-Minerals (ONE-A-DAY WEIGHT SMART ADVANCE PO) [id number] Take 1 tablet by mouth daily. Centrum Silver [provider] Taking Active Self  nitroGLYCERIN (NITROSTAT) 0.4 MG SL tablet [id number] Place 0.4 mg under the tongue every 5 (five) minutes as needed for chest pain. [provider] Taking Active Self  omeprazole (PRILOSEC) 40 MG capsule [id number] Take 40 mg by mouth 2 (two) times daily. [provider] Taking Active   [facility] HospitalULTRA test strip [id number] USE AS DIRECTED THREE TIMES DAILY. LAnn Held DO Taking Active   potassium chloride SA (KLOR-CON) 20 MEQ tablet [id number] TAKE 2 TABLETS BY MOUTH  DAILY LAnn Held DO Taking Active   PROAIR HFA 108 [phone]) MCG/ACT inhaler [id number] Inhale 1 puff into the lungs every 6 (six) hours as needed for wheezing or shortness of breath.  Patient taking differently: Inhale 1-2 puffs into the lungs every 6 (six) hours as needed for wheezing or shortness of breath.   LRoma SchanzR, DO Taking Active   SUMAtriptan (IMITREX) 50 MG tablet [id number] Take 1 tablet as needed for  migraine/vertigo. Do not take more than 3 a week  Patient taking differently: Take 50 mg by mouth every 2 (two) hours as needed for migraine. Max 3 tabs per week   LCarollee Herter YAlferd Apa DO Taking Active   tadalafil (CIALIS) 20 MG tablet [id number] Take 0.5-1 tablets (10-20 mg total)  by mouth every other day as needed for erectile dysfunction. Ann Held, DO Taking Active   topiramate (TOPAMAX) 50 MG tablet [id number] No Take 1 tablet (50 mg total) by mouth 2 (two) times daily. Cameron Sprang, MD Taking Active   verapamil (CALAN-SR) 240 MG CR tablet [id number] No TAKE 1 TABLET BY MOUTH AT  BEDTIME  Patient taking differently: Take 240 mg by mouth at bedtime.   Ann Held, DO Taking Active   vitamin B-12 (CYANOCOBALAMIN) 1000 MCG tablet [id number] No Take 1,000 mcg by mouth daily. [provider] Taking Active Self  XARELTO 20 MG TABS tablet [id number] No TAKE 1 TABLET BY MOUTH  DAILY WITH SUPPER Tanda Rockers, MD Taking Active             Patient Active Problem List   Diagnosis Date Noted   DOE (dyspnea on exertion) 05/11/2021   Abnormal stress test 04/20/2021   Acute respiratory distress    Acute respiratory failure with hypoxia (HCC)    S/P Nissen fundoplication (without gastrostomy tube) procedure 02/20/2021   Body mass index (BMI) 33.0-33.9, adult 11/03/2019   Laceration of right hand 08/02/2019   Acute pulmonary embolism ([place]) 01/07/2019   Chronic diastolic CHF (congestive heart failure) ([place]) 01/07/2019   Preventative health care 02/10/2018   Dizziness 02/10/2018   Spondylolisthesis at L3-L4 level 10/28/2017   Cough variant asthma  vs uacs/ pseudoasthma 06/17/2017   Pneumonia of right middle lobe due to infectious organism 02/28/2017   Pulmonary embolism and infarction ([place]) 02/09/2017   Bilateral pulmonary embolism ([place]) 02/04/2017   Greater trochanteric bursitis of left hip 01/14/2017   Greater trochanteric bursitis of right hip 12/20/2016    Degenerative arthritis of knee, bilateral 10/08/2016   Upper airway cough syndrome 03/22/2016   Multiple pulmonary nodules 03/22/2016   Acute bronchitis 01/22/2016   Headache disorder 01/04/2016   Acute upper respiratory infection 10/25/2015   Elevated CK 07/18/2015   History of colonic polyps 04/11/2015   Lumbar stenosis with neurogenic claudication 11/14/2014   Spondylolysis of cervical region 02/25/2014   Lumbosacral spondylosis without myelopathy 01/06/2014   OSA (obstructive sleep apnea) 12/08/2013   SOB (shortness of breath) on exertion 11/04/2013   Morbid obesity due to excess calories ([place])    Cervicalgia    Venous insufficiency of leg 02/18/2012   Itching 02/18/2012   Mild dementia ([place]) 05/28/2011   Hyperlipidemia associated with type 2 diabetes mellitus ([place]) 05/27/2011   Controlled type 2 diabetes mellitus with diabetic nephropathy ([place]) 04/10/2011   Gout 04/10/2011   Essential hypertension 04/10/2011   OA (osteoarthritis) 04/10/2011   GERD (gastroesophageal reflux disease) 04/10/2011   Migraine headache without aura 04/10/2011   Allergic rhinitis, cause unspecified 04/10/2011   Bee sting allergy 04/10/2011    Immunization History  Administered Date(s) Administered   Fluad Quad(high Dose 65+) 10/07/2019, 10/19/2020   Influenza Split 09/30/2012   Influenza, High Dose Seasonal PF 09/19/2016, 10/07/2017, 10/16/2018   Influenza,inj,Quad PF,6+ Mos 09/15/2013, 09/26/2014, 09/22/2015   PFIZER(Purple Top)SARS-COV-2 Vaccination 02/12/2020, 03/07/2020, 08/19/2020   Pneumococcal Conjugate-13 07/18/2015   Pneumococcal Polysaccharide-23 04/10/2011, 09/19/2016   Tdap 04/10/2011, 04/09/2019, 07/22/2019   Zoster, Live 03/28/2014    Conditions to be addressed/monitored: CHF, CAD, HTN, HLD, Hypertriglyceridemia, DMII, and Asthma, neuropathy, migraine headaches, GERD, gout, mild anemia, OSA; recurrent PE with chronic anticoagulation  Care Plan : General Pharmacy (Adult)   Updates made by Cherre Robins, PHARMD since 08/09/2021 12:00 AM     Problem: astham, hyperlipidemia; type  2 DM; CAD; CHF; HTN; recurrent PE; chronic anticoagulation; migraines; neuropathy; gout; GERD; mild dementia; vertigo; OSA; seasonal allergies   Priority: High  Onset Date: 08/07/2021  Note:   Current Barriers:  Unable to maintain control of type 2 DM Does not adhere to prescribed medication regimen Education needed regarding medication regimen and chronic conditions  Pharmacist Clinical Goal(s):  Over the next 90 days, patient will achieve adherence to monitoring guidelines and medication adherence to achieve therapeutic efficacy achieve control of type 2 DM as evidenced by A1c < 7.0 maintain control of HTN, CHF, hyperlipidemia and asthma  as evidenced by attainment of goals listed below  through collaboration with PharmD and provider.   Interventions: 1:1 collaboration with Carollee Herter, Alferd Apa, DO regarding development and update of comprehensive plan of care as evidenced by provider attestation and co-signature Inter-disciplinary care team collaboration (see longitudinal plan of care) Comprehensive medication review performed; medication list updated in electronic medical record    Hypertension / CHF:  BP Readings from Last 3 Encounters:  07/18/21 123/85  07/05/21 124/72  06/27/21 131/79  Controlled; Goal: BP <140/90, minimize symptoms of CHF and prevent CHF exacerbations Current regimen:  Verapamil 259m daily at bedtime Metoprolol tartrate 583mtwice a day Furosemide 4080mach morning and 33m35mch evening Potassium chloride 33mE44mtake 2 tablets daily Type: Diastolic Last ejection fraction: 03/03/17 EF: 65-70% NYHA Class: II (slight limitation of activity) AHA HF Stage: B (Heart disease present - no symptoms present) Patient has failed these meds in past: None noted  Last BNP was 123 (02/26/2021) Home BP  SBP: 134-140 DBP: 80-85 HR:  70-77 Interventions: Discussed blood pressure goal Updated medication list to reflect increase in metoprolol after last visit with cardiology  Check blood pressure 2-3 time per week, document, and provide at future appointments Ensure daily salt intake < 2300 mg/day  Hyperlipidemia / CAD;  Lab Results  Component Value Date/Time   LDLCALC 84 05/11/2021 09:55 AM   LDLCALC 129 (H) 08/17/2020 09:48 AM   LDLCALC 99 07/18/2015 07:44 AM   LDLDIRECT 104.0 02/14/2020 10:25 AM  Not at goal; LDL goal < 70 Current regimen:  Atorvastatin 80mg 69my (dose increased 05/11/2021) Fenofibrate 160mg d24m Interventions: Discussed LDL goal Maintain cholesterol medication regimen - expect LDL improved with higher atorvastatin dose.   Diabetes Lab Results  Component Value Date/Time   HGBA1C 7.5 (H) 05/11/2021 09:55 AM   HGBA1C 6.8 (H) 02/22/2021 04:03 AM  A1c increasing;  A1c goal <7.0 Checks BG: Daily Recent FBG Readings: 175-200 Denie s/sx of hypoglycemia.  Current regimen:  Metformin 500mg tw79ma day with food Farxiga 10mg dai39mrevious medications tried: metformin 1000mg bid 37m side effects; Victoza daily - stopped after weight loss and improvement in A1c.  Interventions: Discussed A1c goals Since patient did well with Victoza would like to retry GLP1 agent. Coordinate with PCP; Will start Ozempic 0.25mg SQ we66m for 4 weeks Reviewed home blood glucose readings and reviewed goals  Fasting blood glucose goal (before meals) = 80 to 130 Blood glucose goal after a meal = less than 180  Patient self care activities - Over the next 90 days, patient will: Check blood sugar once daily, document, and provide at future appointments Start Ozempic 0.25mg inject36mcutaneously once a week for 4 weeks.   History of Pulmonary embolism (blood clot)  Controlled; Goal: prevent recurrence of blood clots and prevent stroke Current regimen:  Xarelto 33mg daily w69msupper Interventions: Discussed  plan for holding Xarelto  prior to upcoming colonoscopy Screened for patient assistance program (unfortunately patient did not qualify this year)  Continue with plan to hold Xarelto 3 days prior to colonoscopy and restart 2 days following colonoscopy.   Asthma: Controlled; Goal: improve breathing and prevent hospitalizations related to asthma exacerbations Current regimen:  Symbicort 80/4.69mg - inhaler 2 puffs into lungs once a day (during allergy season) Albuterol inhaler or nebulization solution - use as needed for shortness of breath or wheezing Patient has failed these meds in past: Trelegy  Using maintenance inhaler regularly? Yes during allergy season (Spring and Fall)  Frequency of rescue inhaler use: rarely uses current but during allergy season 1 or 2 time per week Managed by pulmonology - Dr. WMelvyn NovasInterventions: Discussed maintenance versus rescue therapy.  Discussed seasonal triggers  Continue to follow up with Dr WMelvyn Novas Continue above medication regimen.   Medication management Pharmacist Clinical Goal(s): Over the next 90 days, patient will work with PharmD and providers to maintain optimal medication adherence Current pharmacy: [facility] Interventions Comprehensive medication review performed. Continue current medication management strategy Focus on medication adherence by filling and taking medications appropriately  Take medications as prescribed Report any questions or concerns to PharmD and/or provider(s)  Patient Goals/Self-Care Activities Over the next 90 days, patient will:  take medications as prescribed,  check glucose daily, document, and provide at future appointments,  collaborate with provider on medication access solutions.  Follow Up Plan: Telephone follow up appointment with care management team member scheduled for:  1 month      Medication Assistance: Patient is in Medicare coverage gap and cost of Symbicort, FWilder Gladeand  Xarelto has increased.Patient states assistance would be helpful but he can afford to continue these medications even if he dose not qualify. Performed review of patient assistance program qualifications and unfortunately patient does not currently qualify for patient assistance.   [facility] [id number]: [phone]: [phone] [facility]  [address] [id number]: [phone]: [phone] Uses pill box? Yes Pt endorses 99% compliance  Follow Up:  Patient agrees to Care Plan and Follow-up.  Plan: Telephone follow up appointment with care management team member scheduled for:  1 months  TCherre Robins PharmD Clinical Pharmacist LE[facility] CenterPrimary Care SW MMoundHSanta [facility]

## 2021-08-08 ENCOUNTER — Other Ambulatory Visit: Payer: Self-pay

## 2021-08-08 ENCOUNTER — Ambulatory Visit: Payer: Medicare Other

## 2021-08-08 DIAGNOSIS — M6281 Muscle weakness (generalized): Secondary | ICD-10-CM | POA: Diagnosis not present

## 2021-08-08 DIAGNOSIS — R2681 Unsteadiness on feet: Secondary | ICD-10-CM

## 2021-08-08 DIAGNOSIS — G8929 Other chronic pain: Secondary | ICD-10-CM | POA: Diagnosis not present

## 2021-08-08 DIAGNOSIS — R42 Dizziness and giddiness: Secondary | ICD-10-CM

## 2021-08-08 DIAGNOSIS — M5441 Lumbago with sciatica, right side: Secondary | ICD-10-CM | POA: Diagnosis not present

## 2021-08-08 DIAGNOSIS — R2689 Other abnormalities of gait and mobility: Secondary | ICD-10-CM | POA: Diagnosis not present
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
## 2021-08-09 ENCOUNTER — Ambulatory Visit: Payer: Medicare Other | Admitting: Pharmacist

## 2021-08-09 DIAGNOSIS — D1801 Hemangioma of skin and subcutaneous tissue: Secondary | ICD-10-CM | POA: Diagnosis not present

## 2021-08-09 DIAGNOSIS — E1165 Type 2 diabetes mellitus with hyperglycemia: Secondary | ICD-10-CM

## 2021-08-09 DIAGNOSIS — L570 Actinic keratosis: Secondary | ICD-10-CM | POA: Diagnosis not present

## 2021-08-09 DIAGNOSIS — L821 Other seborrheic keratosis: Secondary | ICD-10-CM | POA: Diagnosis not present

## 2021-08-09 NOTE — Patient Instructions (Signed)
Visit Information  PATIENT GOALS:  Goals Addressed             This Visit's Progress    Chronic Care Management Pharmacy Care Plan       CARE PLAN ENTRY (see longitudinal plan of care for additional care plan information)  Current Barriers:  Chronic Disease Management support, education, and care coordination needs related to DM, HLD, HF, HTN, Hx of PE, Asthma, GERD, Dementia, Migraine, Allergies, Gout, Pain, Vertigo   Hypertension BP Readings from Last 3 Encounters:  07/18/21 123/85  07/05/21 124/72  06/27/21 131/79  Pharmacist Clinical Goal(s): Over the next 90 days, patient will work with PharmD and providers to maintain BP goal <140/90 Current regimen:  Verapamil '240mg'$  daily at bedtime Metoprolol tartrate '50mg'$  twice a day Interventions: Discussed blood pressure goal Updated medication list to reflect increase in metoprolol after last visit with cardiology  Patient self care activities - Over the next 90 days, patient will: Check blood pressure 2-3 time per week, document, and provide at future appointments Ensure daily salt intake < 2300 mg/day  Hyperlipidemia Lab Results  Component Value Date/Time   LDLCALC 84 05/11/2021 09:55 AM   LDLCALC 129 (H) 08/17/2020 09:48 AM   LDLCALC 99 07/18/2015 07:44 AM   LDLDIRECT 104.0 02/14/2020 10:25 AM  Pharmacist Clinical Goal(s): Over the next 90 days, patient will work with PharmD and providers to achieve LDL goal < 70 Current regimen:  Atorvastatin '80mg'$  daily  Fenofibrate '160mg'$  daily Interventions: Discussed LDL goal Patient self care activities - Over the next 90 days, patient will: Maintain cholesterol medication regimen.  Diabetes Lab Results  Component Value Date/Time   HGBA1C 7.5 (H) 05/11/2021 09:55 AM   HGBA1C 6.8 (H) 02/22/2021 04:03 AM  Pharmacist Clinical Goal(s): Over the next 90 days, patient will work with PharmD and providers to achieve A1c goal <7% Current regimen:  Metformin '500mg'$  twice a day with  food Farxiga '10mg'$  daily Ozampic 0.'25mg'$  once per week Interventions: Discussed A1c goals Since patient did well with Victoza would like to retry GLP1 agent. Coordinated with PCP; Will start Ozempic 0.'25mg'$  SQ weekly for 4 weeks Reviewed home blood glucose readings and reviewed goals  Fasting blood glucose goal (before meals) = 80 to 130 Blood glucose goal after a meal = less than 180  Patient self care activities - Over the next 90 days, patient will: Check blood sugar once daily, document, and provide at future appointments Start Ozempic 0.'25mg'$  inject subcutaneously once a week for 4 weeks.  (Start 08/15/2021 - after colonoscopy) Contact provider with any episodes of hypoglycemia  History of Pulmonary embolism (blood clot)  Pharmacist Clinical Goal(s) Over the next 90 days, patient will work with PharmD and providers to prevent recurrence of blood clots and prevent stroke Current regimen:  Xarelto '20mg'$  daily with supper Interventions: Discussed plan for holding Xarelto prior to upcoming colonoscopy Screened for patient assistance program (unfortunately patient did not qualify this year)  Patient self care activities - Over the next 90 days, patient will: Continue with plan to hold Xarelto 3 days prior to colonoscopy and restart 2 days following colonoscopy.   Asthma: Pharmacist Clinical Goal(s) Over the next 90 days, patient will work with PharmD and providers to improve breathing and prevent hospitalizations related to asthma exacerbations Current regimen:  Symbicort 80/4.65mg - inhaler 2 puffs into lungs once a day Albuterol inhaler or nebulization solution - use as needed for shortness of breath or wheezing Interventions: Discussed maintenance versus rescue therapy.  Discussed  seasonal as the triggers  Patient self care activities - Over the next 90 days, patient will: Continue to follow up with Dr Melvyn Novas  Continue above medication regimen.   Medication management Pharmacist  Clinical Goal(s): Over the next 90 days, patient will work with PharmD and providers to maintain optimal medication adherence Current pharmacy: [facility] Interventions Comprehensive medication review performed. Continue current medication management strategy Patient self care activities - Over the next 90 days, patient will: Focus on medication adherence by filling and taking medications appropriately  Take medications as prescribed Report any questions or concerns to PharmD and/or provider(s)  Please see past updates related to this goal by clicking on the "Past Updates" button in the selected goal          The patient verbalized understanding of instructions, educational materials, and care plan provided today and declined offer to receive copy of patient instructions, educational materials, and care plan.   Telephone follow up appointment with care management team member scheduled for: 2 to 3 weeks  Cherre Robins, PharmD Clinical Pharmacist [facility]

## 2021-08-09 NOTE — Chronic Care Management (AMB) (Signed)
Chronic Care Management Pharmacy Note  08/09/2021 Name:  KYLOR VALVERDE MRN:  [id number] DOB:  22-Jun-1951  Subjective: Garrett [name] is an 70 y.o. year old male who is a primary patient of Ann Held, DO.  The CCM team was consulted for assistance with disease management and care coordination needs.    Engaged with patient face to face for  follow up and injection education for Ozempic  in response to provider referral for pharmacy case management and/or care coordination services.   Consent to Services:  The patient was given information about Chronic Care Management services, agreed to services, and gave verbal consent prior to initiation of services.  Please see initial visit note for detailed documentation.   Patient Care Team: Carollee Herter, Alferd Apa, DO as PCP - General (Family Medicine) Milus Banister, MD (Gastroenterology) [name], MD (Neurosurgery) Calvert Cantor, MD as Consulting Physician (Ophthalmology) Tanda Rockers, MD as Consulting Physician (Pulmonary Disease) Cameron Sprang, MD as Consulting Physician (Neurology) [name], MD as Consulting Physician (Neurosurgery) Marlaine Hind, MD as Consulting Physician (Physical Medicine and Rehabilitation) Cherre Robins, PharmD (Pharmacist)  Recent office visits: 05/11/21 PCP (Dr. Carollee Herter)- Annual visit. Start farxiga 5 mg.  Recent consult visits: 07/05/2021 GI Berniece Pap, NP) Seen for dysphagia. Stopped pantoprazole due to patient preference.  06/27/21 - Audiology (Dr Rachell Cipro) Replace left hearing aid; 06/27/21 Neuro (Dr Delice Lesch) neuropathy and migraines. No med changes - Referral to PT for right foot drop. F/U 1 year. 06/15/21 - Phone Call - PA for atrovastatin 10m - take 2 tablets daily. quantity limit and per insurance. Changed to 861mdaily. 06/06/21 Podiatry (Dr MaPrudence Davidson- Diabetic foot care 04/20/21 Vascular Surgery (Dr ByLamonte Sakai03/11/22 Podiatry (Dr PaPosey Pronto- Follow up diabetic foot  care.  Hospital visits: 02/20/2021 - to 02/27/2021 Hospital admission for hital hernia. No medication changes noted at discharge  Objective:  Lab Results  Component Value Date   CREATININE 1.27 05/11/2021   CREATININE 1.22 02/27/2021   CREATININE 1.06 02/26/2021    Lab Results  Component Value Date   HGBA1C 7.5 (H) 05/11/2021   Last diabetic Eye exam:  Lab Results  Component Value Date/Time   HMDIABEYEEXA No Retinopathy 12/21/2018 12:34 PM    Last diabetic Foot exam: No results found for: HMDIABFOOTEX      Component Value Date/Time   CHOL 152 05/11/2021 0955   CHOL 162 07/18/2015 0744   TRIG 131.0 05/11/2021 0955   TRIG 110 07/18/2015 0744   HDL 41.30 05/11/2021 0955   HDL 41 07/18/2015 0744   CHOLHDL 4 05/11/2021 0955   VLDL 26.2 05/11/2021 0955   LDLCALC 84 05/11/2021 0955   LDLCALC 129 (H) 08/17/2020 0948   LDLCALC 99 07/18/2015 0744   LDLDIRECT 104.0 02/14/2020 1025    Hepatic Function Latest Ref Rng & Units 05/11/2021 02/27/2021 02/26/2021  Total Protein 6.0 - 8.3 g/dL 7.1 6.5 6.4(L)  Albumin 3.5 - 5.2 g/dL 4.3 2.6(L) 2.5(L)  AST 0 - 37 U/L 26 78(H) 127(H)  ALT 0 - 53 U/L 36 189(H) 190(H)  Alk Phosphatase 39 - 117 U/L 54 51 51  Total Bilirubin 0.2 - 1.2 mg/dL 0.4 0.7 0.8  Bilirubin, Direct 0.0 - 0.3 mg/dL - - -    Lab Results  Component Value Date/Time   TSH 1.30 04/28/2018 10:16 AM   TSH 1.24 02/03/2017 10:50 AM    CBC Latest Ref Rng & Units 07/05/2021 05/11/2021 02/27/2021  WBC 4.0 - 10.5 K/uL 8.5  7.6 12.7(H)  Hemoglobin 13.0 - 17.0 g/dL 12.0(L) 12.3(L) 12.1(L)  Hematocrit 39.0 - 52.0 % 36.2(L) 37.4(L) 37.3(L)  Platelets 150.0 - 400.0 K/uL 300.0 305.0 370    No results found for: VD25OH  Clinical ASCVD: Yes  The 10-year ASCVD risk score Mikey Bussing DC Jr., et al., 2013) is: 35%   Values used to calculate the score:     Age: 25 years     Sex: Male     Is Non-Hispanic African American: No     Diabetic: Yes     Tobacco smoker: No     Systolic Blood  Pressure: 123 mmHg     Is BP treated: Yes     HDL Cholesterol: 41.3 mg/dL     Total Cholesterol: 152 mg/dL      Social History   Tobacco Use  Smoking Status Former   Packs/day: 3.00   Years: 30.00   Pack years: 90.00   Types: Cigarettes   Quit date: 01/09/1991   Years since quitting: 30.6  Smokeless Tobacco Former   Types: Snuff   BP Readings from Last 3 Encounters:  07/18/21 123/85  07/05/21 124/72  06/27/21 131/79   Pulse Readings from Last 3 Encounters:  07/18/21 70  07/05/21 85  06/27/21 75   Wt Readings from Last 3 Encounters:  07/05/21 224 lb 12.8 oz (102 kg)  07/02/21 226 lb (102.5 kg)  06/27/21 226 lb (102.5 kg)    Assessment: Review of patient past medical history, allergies, medications, health status, including review of consultants reports, laboratory and other test data, was performed as part of comprehensive evaluation and provision of chronic care management services.   SDOH:  (Social Determinants of Health) assessments and interventions performed:     CCM Care Plan  Allergies  Allergen Reactions   Bee Venom Anaphylaxis   Garlic Swelling    Medications Reviewed Today     Reviewed by Cherre Robins, PharmD (Pharmacist) on 08/09/21 at [place] [facility] Status: <None>   Medication Order Taking? Sig Documenting Provider Last Dose Status Informant  albuterol (PROVENTIL) (2.5 MG/3ML) 0.083% nebulizer solution [id number] Yes Take 3 mLs (2.5 mg total) by nebulization every 6 (six) hours as needed for wheezing or shortness of breath. [name] R, DO Taking Active Self  allopurinol (ZYLOPRIM) 100 MG tablet [id number] Yes TAKE 1 TABLET BY MOUTH  DAILY Carollee Herter, Alferd Apa, DO Taking Active   atorvastatin (LIPITOR) 80 MG tablet [id number] Yes Take 80 mg by mouth every evening. [provider] Taking Active Self  budesonide-formoterol (SYMBICORT) 80-4.5 MCG/ACT inhaler [id number] Yes Inhale 2 puffs into the lungs daily. [provider]  Taking Active Self  celecoxib (CELEBREX) 200 MG capsule [id number] Yes TAKE 1 CAPSULE BY MOUTH  TWICE DAILY  Patient taking differently: Take 400 mg by mouth every morning.   Ann Held, DO Taking Active   dapagliflozin propanediol (FARXIGA) 10 MG TABS tablet [id number] Yes Take 1 tablet (10 mg total) by mouth daily before breakfast. Carollee Herter, Alferd Apa, DO Taking Active   EPINEPHrine 0.3 mg/0.3 mL IJ SOAJ injection [id number] Yes Inject 0.3 mg into the muscle as needed for anaphylaxis.  [provider] Taking Active Self  famotidine (PEPCID) 20 MG tablet [id number] Yes Take 1 tablet (20 mg total) by mouth at bedtime. Milus Banister, MD Taking Active Self  fenofibrate 160 MG tablet [id number] Yes TAKE 1 TABLET BY MOUTH  DAILY Carollee Herter, Alferd Apa, DO Taking Active   fluticasone [facility])  50 MCG/ACT nasal spray [id number] Yes Place 2 sprays into both nostrils daily.  Patient taking differently: Place 2 sprays into both nostrils daily as needed for allergies.   Ann Held, DO Taking Active Self  furosemide (LASIX) 40 MG tablet [id number] Yes Take 40 mg by mouth daily. 59m in the morning 20 mg in evening [provider] Taking Active Self  gabapentin (NEURONTIN) 100 MG capsule [id number] TAKE 2 CAPSULES BY MOUTH AT BEDTIME  Patient taking differently: Take 200 mg by mouth at bedtime.   SLyndal Pulley DO Taking Active   levocetirizine (XYZAL) 5 MG tablet [id number] Take 1 tablet (5 mg total) by mouth every evening. LAnn Held DO Taking Active Self  magnesium oxide (MAG-OX) 400 (241.3 Mg) MG tablet [id number] Take 1 tablet (400 mg total) by mouth daily. AReyne Dumas MD Taking Active Self  meclizine (ANTIVERT) 25 MG tablet [id number] Take 1 tablet (25 mg total) by mouth 3 (three) times daily as needed for dizziness.  Patient taking differently: Take 25 mg by mouth as needed for dizziness.   LRoma SchanzR, DO Taking Active    memantine (NAMENDA) 10 MG tablet [id number] TAKE 2 TABLETS BY MOUTH  DAILY  Patient taking differently: Take 20 mg by mouth daily.   LRoma SchanzR, DO Taking Active   metFORMIN (GLUCOPHAGE) 500 MG tablet [id number] TAKE 1 TABLET BY MOUTH  TWICE DAILY WITH A MEAL  Patient taking differently: Take 500 mg by mouth 2 (two) times daily.   LRoma SchanzR, DO Taking Active   metoprolol tartrate (LOPRESSOR) 50 MG tablet [id number] Take 50 mg by mouth 2 (two) times daily. [provider] Taking Active   Multiple Vitamins-Minerals (ONE-A-DAY WEIGHT SMART ADVANCE PO) [id number] Take 1 tablet by mouth daily. Centrum Silver [provider] Taking Active Self  nitroGLYCERIN (NITROSTAT) 0.4 MG SL tablet [id number] Place 0.4 mg under the tongue every 5 (five) minutes as needed for chest pain. [provider] Taking Active Self  omeprazole (PRILOSEC) 40 MG capsule [id number] Take 40 mg by mouth 2 (two) times daily. [provider] Taking Active   [facility]ULTRA test strip [id number] USE AS DIRECTED THREE TIMES DAILY. LRoma SchanzR, DO Taking Active   potassium chloride SA (KLOR-CON) 20 MEQ tablet [id number] TAKE 2 TABLETS BY MOUTH  DAILY LAnn Held DO Taking Active   PROAIR HFA 108 ([phone]) MCG/ACT inhaler [id number] Inhale 1 puff into the lungs every 6 (six) hours as needed for wheezing or shortness of breath.  Patient taking differently: Inhale 1-2 puffs into the lungs every 6 (six) hours as needed for wheezing or shortness of breath.   LRoma SchanzR, DO Taking Active   Semaglutide (OZEMPIC, 0.25 OR 0.5 MG/DOSE, [place]) [id number] Inject 0.25 mg into the skin once a week.  Patient not taking: Reported on 08/09/2021   [provider] Not Taking Active   SUMAtriptan (IMITREX) 50 MG tablet [id number] Take 1 tablet as needed for migraine/vertigo. Do not take more than 3 a week  Patient taking  differently: Take 50 mg by mouth every 2 (two) hours as needed for migraine. Max 3 tabs per week   LCarollee Herter YAlferd Apa DO Taking Active   tadalafil (CIALIS) 20 MG tablet [id number] Take 0.5-1 tablets (10-20 mg total) by mouth every other day as needed for erectile dysfunction.  Ann Held, DO Taking Active   topiramate (TOPAMAX) 50 MG tablet [id number] Yes Take 1 tablet (50 mg total) by mouth 2 (two) times daily. Cameron Sprang, MD Taking Active   verapamil (CALAN-SR) 240 MG CR tablet [id number] Yes TAKE 1 TABLET BY MOUTH AT  BEDTIME  Patient taking differently: Take 240 mg by mouth at bedtime.   Ann Held, DO Taking Active   vitamin B-12 (CYANOCOBALAMIN) 1000 MCG tablet [id number] Yes Take 1,000 mcg by mouth daily. [provider] Taking Active Self  XARELTO 20 MG TABS tablet [id number] Yes TAKE 1 TABLET BY MOUTH  DAILY WITH SUPPER Tanda Rockers, MD Taking Active             Patient Active Problem List   Diagnosis Date Noted   DOE (dyspnea on exertion) 05/11/2021   Abnormal stress test 04/20/2021   Acute respiratory distress    Acute respiratory failure with hypoxia (HCC)    S/P Nissen fundoplication (without gastrostomy tube) procedure 02/20/2021   Body mass index (BMI) 33.0-33.9, adult 11/03/2019   Laceration of right hand 08/02/2019   Acute pulmonary embolism ([place]) 01/07/2019   Chronic diastolic CHF (congestive heart failure) ([place]) 01/07/2019   Preventative health care 02/10/2018   Dizziness 02/10/2018   Spondylolisthesis at L3-L4 level 10/28/2017   Cough variant asthma  vs uacs/ pseudoasthma 06/17/2017   Pneumonia of right middle lobe due to infectious organism 02/28/2017   Pulmonary embolism and infarction ([place]) 02/09/2017   Bilateral pulmonary embolism ([place]) 02/04/2017   Greater trochanteric bursitis of left hip 01/14/2017   Greater trochanteric bursitis of right hip 12/20/2016   Degenerative arthritis of knee, bilateral 10/08/2016    Upper airway cough syndrome 03/22/2016   Multiple pulmonary nodules 03/22/2016   Acute bronchitis 01/22/2016   Headache disorder 01/04/2016   Acute upper respiratory infection 10/25/2015   Elevated CK 07/18/2015   History of colonic polyps 04/11/2015   Lumbar stenosis with neurogenic claudication 11/14/2014   Spondylolysis of cervical region 02/25/2014   Lumbosacral spondylosis without myelopathy 01/06/2014   OSA (obstructive sleep apnea) 12/08/2013   SOB (shortness of breath) on exertion 11/04/2013   Morbid obesity due to excess calories ([place])    Cervicalgia    Venous insufficiency of leg 02/18/2012   Itching 02/18/2012   Mild dementia ([place]) 05/28/2011   Hyperlipidemia associated with type 2 diabetes mellitus ([place]) 05/27/2011   Controlled type 2 diabetes mellitus with diabetic nephropathy ([place]) 04/10/2011   Gout 04/10/2011   Essential hypertension 04/10/2011   OA (osteoarthritis) 04/10/2011   GERD (gastroesophageal reflux disease) 04/10/2011   Migraine headache without aura 04/10/2011   Allergic rhinitis, cause unspecified 04/10/2011   Bee sting allergy 04/10/2011    Immunization History  Administered Date(s) Administered   Fluad Quad(high Dose 65+) 10/07/2019, 10/19/2020   Influenza Split 09/30/2012   Influenza, High Dose Seasonal PF 09/19/2016, 10/07/2017, 10/16/2018   Influenza,inj,Quad PF,6+ Mos 09/15/2013, 09/26/2014, 09/22/2015   PFIZER(Purple Top)SARS-COV-2 Vaccination 02/12/2020, 03/07/2020, 08/19/2020   Pneumococcal Conjugate-13 07/18/2015   Pneumococcal Polysaccharide-23 04/10/2011, 09/19/2016   Tdap 04/10/2011, 04/09/2019, 07/22/2019   Zoster, Live 03/28/2014    Conditions to be addressed/monitored: CHF, CAD, HTN, HLD, Hypertriglyceridemia, DMII, and Asthma, neuropathy, migraine headaches, GERD, gout, mild anemia, OSA; recurrent PE with chronic anticoagulation  Care Plan : General Pharmacy (Adult)  Updates made by Cherre Robins, PHARMD since 08/09/2021 12:00 AM      Problem: [place]ham, hyperlipidemia; type 2 DM; CAD; CHF; HTN; recurrent PE; chronic anticoagulation; migraines;  neuropathy; gout; GERD; mild dementia; vertigo; OSA; seasonal allergies   Priority: High  Onset Date: 08/07/2021  Note:   Current Barriers:  Unable to maintain control of type 2 DM Does not adhere to prescribed medication regimen Education needed regarding medication regimen and chronic conditions  Pharmacist Clinical Goal(s):  Over the next 90 days, patient will achieve adherence to monitoring guidelines and medication adherence to achieve therapeutic efficacy achieve control of type 2 DM as evidenced by A1c < 7.0 maintain control of HTN, CHF, hyperlipidemia and asthma  as evidenced by attainment of goals listed below  through collaboration with PharmD and provider.   Interventions: 1:1 collaboration with Carollee Herter, Alferd Apa, DO regarding development and update of comprehensive plan of care as evidenced by provider attestation and co-signature Inter-disciplinary care team collaboration (see longitudinal plan of care) Comprehensive medication review performed; medication list updated in electronic medical record    Hypertension / CHF:  BP Readings from Last 3 Encounters:  07/18/21 123/85  07/05/21 124/72  06/27/21 131/79  Controlled; Goal: BP <140/90, minimize symptoms of CHF and prevent CHF exacerbations Current regimen:  Verapamil 239m daily at bedtime Metoprolol tartrate 555mtwice a day Furosemide 4054mach morning and 58m59mch evening Potassium chloride 58mE26mtake 2 tablets daily Type: Diastolic Last ejection fraction: 03/03/17 EF: 65-70% NYHA Class: II (slight limitation of activity) AHA HF Stage: B (Heart disease present - no symptoms present) Patient has failed these meds in past: None noted  Last BNP was 123 (02/26/2021) Home BP  SBP: 134-140 DBP: 80-85 HR: 70-77 Interventions: Discussed blood pressure goal Updated medication list to reflect  increase in metoprolol after last visit with cardiology  Check blood pressure 2-3 time per week, document, and provide at future appointments Ensure daily salt intake < 2300 mg/day  Hyperlipidemia / CAD;  Lab Results  Component Value Date/Time   LDLCALC 84 05/11/2021 09:55 AM   LDLCALC 129 (H) 08/17/2020 09:48 AM   LDLCALC 99 07/18/2015 07:44 AM   LDLDIRECT 104.0 02/14/2020 10:25 AM  Not at goal; LDL goal < 70 Current regimen:  Atorvastatin 80mg 67my (dose increased 05/11/2021) Fenofibrate 160mg d72m Interventions: Discussed LDL goal Maintain cholesterol medication regimen - expect LDL improved with higher atorvastatin dose.   Diabetes Lab Results  Component Value Date/Time   HGBA1C 7.5 (H) 05/11/2021 09:55 AM   HGBA1C 6.8 (H) 02/22/2021 04:03 AM  A1c increasing;  A1c goal <7.0 Checks BG: Daily Recent FBG Readings: 175-200 Denie s/sx of hypoglycemia.  Current regimen:  Metformin 500mg tw62ma day with food Farxiga 10mg dai6mzempic 0.25mg inje68m once per week Previous medications tried: metformin 1000mg bid -1mside effects; Victoza daily - stopped after weight loss and improvement in A1c.  Interventions: Discussed A1c goals Will start Ozempic 0.25mg SQ wee82mfor 4 weeks; Patient education provided on injection technique Reviewed home blood glucose readings and reviewed goals  Fasting blood glucose goal (before meals) = 80 to 130 Blood glucose goal after a meal = less than 180  Patient self care activities - Over the next 90 days, patient will: Check blood sugar once daily, document, and provide at future appointments Start Ozempic 0.25mg inject 44mutaneously once a week for 4 weeks. (Plan to start Wednesday, August 24th after colonoscopy)  History of Pulmonary embolism (blood clot)  Controlled; Goal: prevent recurrence of blood clots and prevent stroke Current regimen:  Xarelto 58mg daily wi19mupper Interventions: Discussed plan for holding Xarelto prior to  upcoming colonoscopy Screened for  patient assistance program (unfortunately patient did not qualify this year)  Continue with plan to hold Xarelto 3 days prior to colonoscopy and restart 2 days following colonoscopy.   Asthma: Controlled; Goal: improve breathing and prevent hospitalizations related to asthma exacerbations Current regimen:  Symbicort 80/4.46mg - inhaler 2 puffs into lungs once a day (during allergy season) Albuterol inhaler or nebulization solution - use as needed for shortness of breath or wheezing Patient has failed these meds in past: Trelegy  Using maintenance inhaler regularly? Yes during allergy season (Spring and Fall)  Frequency of rescue inhaler use: rarely uses current but during allergy season 1 or 2 time per week Managed by pulmonology - Dr. WMelvyn NovasInterventions: Discussed maintenance versus rescue therapy.  Discussed seasonal triggers  Continue to follow up with Dr WMelvyn Novas Continue above medication regimen.   Medication management Pharmacist Clinical Goal(s): Over the next 90 days, patient will work with PharmD and providers to maintain optimal medication adherence Current pharmacy: [facility] Interventions Comprehensive medication review performed. Continue current medication management strategy Focus on medication adherence by filling and taking medications appropriately  Take medications as prescribed Report any questions or concerns to PharmD and/or provider(s)  Patient Goals/Self-Care Activities Over the next 90 days, patient will:  take medications as prescribed,  check glucose daily, document, and provide at future appointments,  collaborate with provider on medication access solutions.  Follow Up Plan: Telephone follow up appointment with care management team member scheduled for:  1 month      Medication Assistance: Patient is in Medicare coverage gap and cost of Symbicort, FWilder Gladeand Xarelto has increased.Patient states  assistance would be helpful but he can afford to continue these medications even if he dose not qualify. During 08/08/2021 phone visit performed review of patient assistance program qualifications and unfortunately patient does not currently qualify for patient assistance.   [facility]'s [facility]:  [facility] [place] [facility] [address] [id number]: [phone]: [phone] [facility]  [address] [address]Phone: [phone]: [phone] Uses pill box? Yes Pt endorses 99% compliance  Follow Up:  Patient agrees to Care Plan and Follow-up.  Plan: Telephone follow up appointment with care management team member scheduled for:  2 to 3 weeks  TCherre Robins PharmD Clinical Pharmacist [facility]

## 2021-08-09 NOTE — Patient Instructions (Signed)
Visit Information  PATIENT GOALS:  Goals Addressed             This Visit's Progress    Chronic Care Management Pharmacy Care Plan   Not on track    [place] (see longitudinal plan of care for additional care plan information)  Current Barriers:  Chronic Disease Management support, education, and care coordination needs related to DM, HLD, HF, HTN, Hx of PE, Asthma, GERD, Dementia, Migraine, Allergies, Gout, Pain, Vertigo   Hypertension BP Readings from Last 3 Encounters:  07/18/21 123/85  07/05/21 124/72  06/27/21 131/79  Pharmacist Clinical Goal(s): Over the next 90 days, patient will work with PharmD and providers to maintain BP goal <140/90 Current regimen:  Verapamil '240mg'$  daily at bedtime Metoprolol tartrate '50mg'$  twice a day Interventions: Discussed blood pressure goal Updated medication list to reflect increase in metoprolol after last visit with cardiology  Patient self care activities - Over the next 90 days, patient will: Check blood pressure 2-3 time per week, document, and provide at future appointments Ensure daily salt intake < 2300 mg/day  Hyperlipidemia Lab Results  Component Value Date/Time   LDLCALC 84 05/11/2021 09:55 AM   LDLCALC 129 (H) 08/17/2020 09:48 AM   LDLCALC 99 07/18/2015 07:44 AM   LDLDIRECT 104.0 02/14/2020 10:25 AM  Pharmacist Clinical Goal(s): Over the next 90 days, patient will work with PharmD and providers to achieve LDL goal < 70 Current regimen:  Atorvastatin '80mg'$  daily  Fenofibrate '160mg'$  daily Interventions: Discussed LDL goal Patient self care activities - Over the next 90 days, patient will: Maintain cholesterol medication regimen.  Diabetes Lab Results  Component Value Date/Time   HGBA1C 7.5 (H) 05/11/2021 09:55 AM   HGBA1C 6.8 (H) 02/22/2021 04:03 AM  Pharmacist Clinical Goal(s): Over the next 90 days, patient will work with PharmD and providers to achieve A1c goal <7% Current regimen:  Metformin '500mg'$  twice  a day with food Farxiga '10mg'$  daily Interventions: Discussed A1c goals Since patient did well with Victoza would like to retry GLP1 agent. Coordinate with PCP; Will start Ozempic 0.'25mg'$  SQ weekly for 4 weeks Reviewed home blood glucose readings and reviewed goals  Fasting blood glucose goal (before meals) = 80 to 130 Blood glucose goal after a meal = less than 180  Patient self care activities - Over the next 90 days, patient will: Check blood sugar once daily, document, and provide at future appointments Start Ozempic 0.'25mg'$  inject subcutaneously once a week for 4 weeks.  Contact provider with any episodes of hypoglycemia  History of Pulmonary embolism (blood clot)  Pharmacist Clinical Goal(s) Over the next 90 days, patient will work with PharmD and providers to prevent recurrence of blood clots and prevent stroke Current regimen:  Xarelto '20mg'$  daily with supper Interventions: Discussed plan for holding Xarelto prior to upcoming colonoscopy Screened for patient assistance program (unfortunately patient did not qualify this year)  Patient self care activities - Over the next 90 days, patient will: Continue with plan to hold Xarelto 3 days prior to colonoscopy and restart 2 days following colonoscopy.   Asthma: Pharmacist Clinical Goal(s) Over the next 90 days, patient will work with PharmD and providers to improve breathing and prevent hospitalizations related to asthma exacerbations Current regimen:  Symbicort 80/4.50mg - inhaler 2 puffs into lungs once a day Albuterol inhaler or nebulization solution - use as needed for shortness of breath or wheezing Interventions: Discussed maintenance versus rescue therapy.  Discussed seasonal as the triggers  Patient self care  activities - Over the next 90 days, patient will: Continue to follow up with Dr Melvyn Novas  Continue above medication regimen.   Medication management Pharmacist Clinical Goal(s): Over the next 90 days, patient will work  with PharmD and providers to maintain optimal medication adherence Current pharmacy: [facility] Interventions Comprehensive medication review performed. Continue current medication management strategy Patient self care activities - Over the next 90 days, patient will: Focus on medication adherence by filling and taking medications appropriately  Take medications as prescribed Report any questions or concerns to PharmD and/or provider(s)  Please see past updates related to this goal by clicking on the "Past Updates" button in the selected goal       Use symbicort twice daily   Not on track    Using your symbicort twice daily could help you use your rescue inhaler less often        The patient verbalized understanding of instructions, educational materials, and care plan provided today and declined offer to receive copy of patient instructions, educational materials, and care plan.   Telephone follow up appointment with care management team member scheduled for: 3 to 4 weeks  Cherre Robins, PharmD Clinical Pharmacist [facility]

## 2021-08-13 ENCOUNTER — Ambulatory Visit: Payer: Medicare Other

## 2021-08-13 ENCOUNTER — Other Ambulatory Visit: Payer: Self-pay

## 2021-08-13 DIAGNOSIS — A419 Sepsis, unspecified organism: Secondary | ICD-10-CM | POA: Diagnosis not present

## 2021-08-13 DIAGNOSIS — R2681 Unsteadiness on feet: Secondary | ICD-10-CM

## 2021-08-13 DIAGNOSIS — M6281 Muscle weakness (generalized): Secondary | ICD-10-CM

## 2021-08-13 DIAGNOSIS — R2689 Other abnormalities of gait and mobility: Secondary | ICD-10-CM
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
## 2021-08-13 NOTE — Patient Instructions (Signed)
Access Code: [email or web address] URL: [email or web address] Date: 08/13/2021 Prepared by: Baldomero Lamy  Exercises Sit to Stand Without Arm Support - 1 x daily - 7 x weekly - 2 sets - 10 reps Seated Ankle Eversion with Resistance - 1 x daily - 7 x weekly - 2 sets - 10 reps Ankle Inversion Eversion Towel Slide - 1 x daily - 7 x weekly - 2 sets - 10 reps Standing with Head Nod - 1 x daily - 5 x weekly - 2 sets - 10 reps - 10 sec hold Step Up - 1 x daily - 5 x weekly - 2 sets - 10 reps Standing Marching - 1 x daily - 5 x weekly - 2 sets - 10 reps Stride Stance Weight Shift - 1 x daily - 5 x weekly - 1 sets - 3 reps - 10-15 seconds hold

## 2021-08-14 ENCOUNTER — Other Ambulatory Visit: Payer: Self-pay

## 2021-08-14 ENCOUNTER — Emergency Department (HOSPITAL_COMMUNITY): Payer: Medicare Other

## 2021-08-14 ENCOUNTER — Ambulatory Visit (AMBULATORY_SURGERY_CENTER): Payer: Medicare Other | Admitting: Gastroenterology

## 2021-08-14 ENCOUNTER — Encounter (HOSPITAL_COMMUNITY): Payer: Self-pay | Admitting: Internal Medicine

## 2021-08-14 ENCOUNTER — Encounter: Payer: Self-pay | Admitting: Gastroenterology

## 2021-08-14 ENCOUNTER — Inpatient Hospital Stay (HOSPITAL_COMMUNITY)
Admission: EM | Admit: 2021-08-14 | Discharge: 2021-08-20 | DRG: 871 | Disposition: A | Discharge status: Home / Self Care | Payer: Medicare Other | Attending: Internal Medicine | Admitting: Internal Medicine

## 2021-08-14 ENCOUNTER — Telehealth: Payer: Self-pay | Admitting: *Deleted

## 2021-08-14 VITALS — BP 128/75 | HR 66 | Temp 97.8°F | Resp 19 | Ht 69.0 in | Wt 224.0 lb

## 2021-08-14 DIAGNOSIS — R0602 Shortness of breath: Secondary | ICD-10-CM | POA: Diagnosis not present

## 2021-08-14 DIAGNOSIS — I499 Cardiac arrhythmia, unspecified: Secondary | ICD-10-CM | POA: Diagnosis not present

## 2021-08-14 DIAGNOSIS — Z09 Encounter for follow-up examination after completed treatment for conditions other than malignant neoplasm: Secondary | ICD-10-CM

## 2021-08-14 DIAGNOSIS — E872 Acidosis, unspecified: Secondary | ICD-10-CM | POA: Diagnosis present

## 2021-08-14 DIAGNOSIS — I248 Other forms of acute ischemic heart disease: Secondary | ICD-10-CM | POA: Diagnosis present

## 2021-08-14 DIAGNOSIS — E1165 Type 2 diabetes mellitus with hyperglycemia: Secondary | ICD-10-CM | POA: Diagnosis present

## 2021-08-14 DIAGNOSIS — E876 Hypokalemia: Secondary | ICD-10-CM | POA: Diagnosis present

## 2021-08-14 DIAGNOSIS — Z7951 Long term (current) use of inhaled steroids: Secondary | ICD-10-CM

## 2021-08-14 DIAGNOSIS — I5032 Chronic diastolic (congestive) heart failure: Secondary | ICD-10-CM | POA: Diagnosis present

## 2021-08-14 DIAGNOSIS — R1310 Dysphagia, unspecified: Secondary | ICD-10-CM | POA: Diagnosis not present

## 2021-08-14 DIAGNOSIS — E1121 Type 2 diabetes mellitus with diabetic nephropathy: Secondary | ICD-10-CM | POA: Diagnosis present

## 2021-08-14 DIAGNOSIS — J189 Pneumonia, unspecified organism: Secondary | ICD-10-CM | POA: Diagnosis present

## 2021-08-14 DIAGNOSIS — R0609 Other forms of dyspnea: Secondary | ICD-10-CM | POA: Diagnosis not present

## 2021-08-14 DIAGNOSIS — Z7984 Long term (current) use of oral hypoglycemic drugs: Secondary | ICD-10-CM

## 2021-08-14 DIAGNOSIS — Z833 Family history of diabetes mellitus: Secondary | ICD-10-CM | POA: Diagnosis not present

## 2021-08-14 DIAGNOSIS — K219 Gastro-esophageal reflux disease without esophagitis: Secondary | ICD-10-CM

## 2021-08-14 DIAGNOSIS — Z8249 Family history of ischemic heart disease and other diseases of the circulatory system: Secondary | ICD-10-CM

## 2021-08-14 DIAGNOSIS — I10 Essential (primary) hypertension: Secondary | ICD-10-CM | POA: Diagnosis not present

## 2021-08-14 DIAGNOSIS — M549 Dorsalgia, unspecified: Secondary | ICD-10-CM | POA: Diagnosis not present

## 2021-08-14 DIAGNOSIS — Z823 Family history of stroke: Secondary | ICD-10-CM | POA: Diagnosis not present

## 2021-08-14 DIAGNOSIS — K5730 Diverticulosis of large intestine without perforation or abscess without bleeding: Secondary | ICD-10-CM

## 2021-08-14 DIAGNOSIS — R5082 Postprocedural fever: Secondary | ICD-10-CM

## 2021-08-14 DIAGNOSIS — D649 Anemia, unspecified: Secondary | ICD-10-CM | POA: Diagnosis not present

## 2021-08-14 DIAGNOSIS — R000 Tachycardia, unspecified: Secondary | ICD-10-CM | POA: Diagnosis not present

## 2021-08-14 DIAGNOSIS — Z8719 Personal history of other diseases of the digestive system: Secondary | ICD-10-CM

## 2021-08-14 DIAGNOSIS — D123 Benign neoplasm of transverse colon: Secondary | ICD-10-CM

## 2021-08-14 DIAGNOSIS — M109 Gout, unspecified: Secondary | ICD-10-CM | POA: Diagnosis not present

## 2021-08-14 DIAGNOSIS — R059 Cough, unspecified: Secondary | ICD-10-CM | POA: Diagnosis not present

## 2021-08-14 DIAGNOSIS — Z743 Need for continuous supervision: Secondary | ICD-10-CM | POA: Diagnosis not present

## 2021-08-14 DIAGNOSIS — E1169 Type 2 diabetes mellitus with other specified complication: Secondary | ICD-10-CM | POA: Diagnosis present

## 2021-08-14 DIAGNOSIS — J9601 Acute respiratory failure with hypoxia: Secondary | ICD-10-CM | POA: Diagnosis not present

## 2021-08-14 DIAGNOSIS — J984 Other disorders of lung: Secondary | ICD-10-CM | POA: Diagnosis not present

## 2021-08-14 DIAGNOSIS — R197 Diarrhea, unspecified: Secondary | ICD-10-CM

## 2021-08-14 DIAGNOSIS — R6889 Other general symptoms and signs: Secondary | ICD-10-CM | POA: Diagnosis not present

## 2021-08-14 DIAGNOSIS — R079 Chest pain, unspecified: Secondary | ICD-10-CM | POA: Diagnosis not present

## 2021-08-14 DIAGNOSIS — R6520 Severe sepsis without septic shock: Secondary | ICD-10-CM | POA: Diagnosis not present

## 2021-08-14 DIAGNOSIS — Z7901 Long term (current) use of anticoagulants: Secondary | ICD-10-CM | POA: Diagnosis not present

## 2021-08-14 DIAGNOSIS — Z20822 Contact with and (suspected) exposure to covid-19: Secondary | ICD-10-CM | POA: Diagnosis not present

## 2021-08-14 DIAGNOSIS — K449 Diaphragmatic hernia without obstruction or gangrene: Secondary | ICD-10-CM

## 2021-08-14 DIAGNOSIS — Z87891 Personal history of nicotine dependence: Secondary | ICD-10-CM

## 2021-08-14 DIAGNOSIS — J9811 Atelectasis: Secondary | ICD-10-CM | POA: Diagnosis not present

## 2021-08-14 DIAGNOSIS — E1142 Type 2 diabetes mellitus with diabetic polyneuropathy: Secondary | ICD-10-CM | POA: Diagnosis not present

## 2021-08-14 DIAGNOSIS — K429 Umbilical hernia without obstruction or gangrene: Secondary | ICD-10-CM | POA: Diagnosis not present

## 2021-08-14 DIAGNOSIS — E785 Hyperlipidemia, unspecified: Secondary | ICD-10-CM | POA: Diagnosis not present

## 2021-08-14 DIAGNOSIS — Z79899 Other long term (current) drug therapy: Secondary | ICD-10-CM

## 2021-08-14 DIAGNOSIS — I4510 Unspecified right bundle-branch block: Secondary | ICD-10-CM | POA: Diagnosis not present

## 2021-08-14 DIAGNOSIS — I110 Hypertensive heart disease with heart failure: Secondary | ICD-10-CM | POA: Diagnosis not present

## 2021-08-14 DIAGNOSIS — I2510 Atherosclerotic heart disease of native coronary artery without angina pectoris: Secondary | ICD-10-CM | POA: Diagnosis not present

## 2021-08-14 DIAGNOSIS — Z86711 Personal history of pulmonary embolism: Secondary | ICD-10-CM

## 2021-08-14 DIAGNOSIS — Z86010 Personal history of colonic polyps: Secondary | ICD-10-CM

## 2021-08-14 DIAGNOSIS — A419 Sepsis, unspecified organism: Secondary | ICD-10-CM | POA: Diagnosis not present

## 2021-08-14 DIAGNOSIS — M47814 Spondylosis without myelopathy or radiculopathy, thoracic region: Secondary | ICD-10-CM | POA: Diagnosis not present

## 2021-08-14 DIAGNOSIS — D122 Benign neoplasm of ascending colon: Secondary | ICD-10-CM | POA: Diagnosis not present

## 2021-08-14 DIAGNOSIS — R109 Unspecified abdominal pain: Secondary | ICD-10-CM | POA: Diagnosis not present

## 2021-08-14 DIAGNOSIS — K223 Perforation of esophagus: Secondary | ICD-10-CM | POA: Diagnosis not present

## 2021-08-14 HISTORY — PX: UPPER GASTROINTESTINAL ENDOSCOPY: SHX188

## 2021-08-14 HISTORY — PX: COLONOSCOPY: SHX174

## 2021-08-14 LAB — COMPREHENSIVE METABOLIC PANEL
ALT: 52 U/L — ABNORMAL HIGH (ref 0–44)
AST: 51 U/L — ABNORMAL HIGH (ref 15–41)
Albumin: 3.3 g/dL — ABNORMAL LOW (ref 3.5–5.0)
Alkaline Phosphatase: 44 U/L (ref 38–126)
Anion gap: 10 (ref 5–15)
BUN: 14 mg/dL (ref 8–23)
CO2: 18 mmol/L — ABNORMAL LOW (ref 22–32)
Calcium: 8.7 mg/dL — ABNORMAL LOW (ref 8.9–10.3)
Chloride: 108 mmol/L (ref 98–111)
Creatinine, Ser: 1.34 mg/dL — ABNORMAL HIGH (ref 0.61–1.24)
GFR, Estimated: 57 mL/min — ABNORMAL LOW (ref >60)
Glucose, Bld: 229 mg/dL — ABNORMAL HIGH (ref 70–99)
Potassium: 3 mmol/L — ABNORMAL LOW (ref 3.5–5.1)
Sodium: 136 mmol/L (ref 135–145)
Total Bilirubin: 0.7 mg/dL (ref 0.3–1.2)
Total Protein: 6.2 g/dL — ABNORMAL LOW (ref 6.5–8.1)

## 2021-08-14 LAB — URINALYSIS, ROUTINE W REFLEX MICROSCOPIC
Bacteria, UA: NONE SEEN
Bilirubin Urine: NEGATIVE
Glucose, UA: >=500 mg/dL — AB
Hgb urine dipstick: NEGATIVE
Ketones, ur: NEGATIVE mg/dL
Leukocytes,Ua: NEGATIVE
Nitrite: NEGATIVE
Protein, ur: NEGATIVE mg/dL
Specific Gravity, Urine: 1.027 (ref 1.005–1.030)
pH: 6 (ref 5.0–8.0)

## 2021-08-14 LAB — CBC WITH DIFFERENTIAL/PLATELET
Abs Immature Granulocytes: 0.05 10*3/uL (ref 0.00–0.07)
Basophils Absolute: 0 10*3/uL (ref 0.0–0.1)
Basophils Relative: 0 %
Eosinophils Absolute: 0 10*3/uL (ref 0.0–0.5)
Eosinophils Relative: 0 %
HCT: 39.5 % (ref 39.0–52.0)
Hemoglobin: 12.7 g/dL — ABNORMAL LOW (ref 13.0–17.0)
Immature Granulocytes: 1 %
Lymphocytes Relative: 3 %
Lymphs Abs: 0.4 10*3/uL — ABNORMAL LOW (ref 0.7–4.0)
MCH: 28.5 pg (ref 26.0–34.0)
MCHC: 32.2 g/dL (ref 30.0–36.0)
MCV: 88.8 fL (ref 80.0–100.0)
Monocytes Absolute: 0.6 10*3/uL (ref 0.1–1.0)
Monocytes Relative: 5 %
Neutro Abs: 10 10*3/uL — ABNORMAL HIGH (ref 1.7–7.7)
Neutrophils Relative %: 91 %
Platelets: 236 10*3/uL (ref 150–400)
RBC: 4.45 MIL/uL (ref 4.22–5.81)
RDW: 16.1 % — ABNORMAL HIGH (ref 11.5–15.5)
WBC: 11 10*3/uL — ABNORMAL HIGH (ref 4.0–10.5)
nRBC: 0 % (ref 0.0–0.2)

## 2021-08-14 LAB — RESP PANEL BY RT-PCR (FLU A&B, COVID) ARPGX2
Influenza A by PCR: NEGATIVE
Influenza B by PCR: NEGATIVE
SARS Coronavirus 2 by RT PCR: NEGATIVE

## 2021-08-14 LAB — APTT: aPTT: 27 seconds (ref 24–36)

## 2021-08-14 LAB — LACTIC ACID, PLASMA
Lactic Acid, Venous: 3.7 mmol/L (ref 0.5–1.9)
Lactic Acid, Venous: 3.5 mmol/L (ref 0.5–1.9)

## 2021-08-14 LAB — PROTIME-INR
INR: 1.3 — ABNORMAL HIGH (ref 0.8–1.2)
Prothrombin Time: 16.1 seconds — ABNORMAL HIGH (ref 11.4–15.2)

## 2021-08-14 LAB — CBG MONITORING, ED: Glucose-Capillary: 178 mg/dL — ABNORMAL HIGH (ref 70–99)

## 2021-08-14 MED ORDER — METRONIDAZOLE 500 MG/100ML IV SOLN
500.0000 mg | Freq: Three times a day (TID) | INTRAVENOUS | Status: DC
  Administered 2021-08-14 – 2021-08-18 (×12): 500 mg via INTRAVENOUS
  Filled 2021-08-14 (×11): qty 100

## 2021-08-14 MED ORDER — VANCOMYCIN HCL 1250 MG/250ML IV SOLN
1250.0000 mg | INTRAVENOUS | Status: DC
  Filled 2021-08-14: qty 250

## 2021-08-14 MED ORDER — ATORVASTATIN CALCIUM 80 MG PO TABS
80.0000 mg | ORAL_TABLET | Freq: Every evening | ORAL | Status: DC
  Administered 2021-08-14 – 2021-08-19 (×6): 80 mg via ORAL
  Filled 2021-08-14 (×3): qty 1
  Filled 2021-08-14: qty 2
  Filled 2021-08-14 (×2): qty 1

## 2021-08-14 MED ORDER — PANTOPRAZOLE SODIUM 40 MG PO TBEC
40.0000 mg | DELAYED_RELEASE_TABLET | Freq: Every day | ORAL | Status: DC
  Administered 2021-08-15 – 2021-08-20 (×6): 40 mg via ORAL
  Filled 2021-08-14 (×6): qty 1

## 2021-08-14 MED ORDER — SODIUM CHLORIDE 0.9 % IV SOLN
2.0000 g | Freq: Two times a day (BID) | INTRAVENOUS | Status: DC
  Administered 2021-08-15 – 2021-08-16 (×3): 2 g via INTRAVENOUS
  Filled 2021-08-14 (×3): qty 2

## 2021-08-14 MED ORDER — ALBUTEROL SULFATE HFA 108 (90 BASE) MCG/ACT IN AERS
1.0000 | INHALATION_SPRAY | Freq: Four times a day (QID) | RESPIRATORY_TRACT | Status: DC | PRN
  Administered 2021-08-14 – 2021-08-15 (×2): 2 via RESPIRATORY_TRACT
  Filled 2021-08-14: qty 6.7

## 2021-08-14 MED ORDER — SODIUM CHLORIDE 0.9 % IV SOLN: 500.0000 mL | Freq: Once | INTRAVENOUS | Status: DC

## 2021-08-14 MED ORDER — RIVAROXABAN 10 MG PO TABS
20.0000 mg | ORAL_TABLET | Freq: Every day | ORAL | Status: DC
  Administered 2021-08-15 – 2021-08-19 (×5): 20 mg via ORAL
  Filled 2021-08-14 (×5): qty 2

## 2021-08-14 MED ORDER — ACETAMINOPHEN 650 MG RE SUPP: 650.0000 mg | Freq: Four times a day (QID) | RECTAL | Status: DC | PRN

## 2021-08-14 MED ORDER — POTASSIUM CHLORIDE 10 MEQ/100ML IV SOLN
10.0000 meq | INTRAVENOUS | Status: AC
  Administered 2021-08-14 – 2021-08-15 (×3): 10 meq via INTRAVENOUS
  Filled 2021-08-14 (×3): qty 100

## 2021-08-14 MED ORDER — LACTATED RINGERS IV BOLUS (SEPSIS)
1000.0000 mL | Freq: Once | INTRAVENOUS | Status: AC
  Administered 2021-08-14: 1000 mL via INTRAVENOUS

## 2021-08-14 MED ORDER — VANCOMYCIN HCL 2000 MG/400ML IV SOLN
2000.0000 mg | Freq: Once | INTRAVENOUS | Status: AC
  Administered 2021-08-14: 2000 mg via INTRAVENOUS
  Filled 2021-08-14: qty 400

## 2021-08-14 MED ORDER — METRONIDAZOLE 500 MG/100ML IV SOLN
500.0000 mg | Freq: Once | INTRAVENOUS | Status: AC
  Administered 2021-08-14: 500 mg via INTRAVENOUS
  Filled 2021-08-14: qty 100

## 2021-08-14 MED ORDER — LACTATED RINGERS IV SOLN: INTRAVENOUS | Status: AC

## 2021-08-14 MED ORDER — SODIUM CHLORIDE 0.9 % IV SOLN
2.0000 g | Freq: Once | INTRAVENOUS | Status: AC
  Administered 2021-08-14: 2 g via INTRAVENOUS
  Filled 2021-08-14: qty 2

## 2021-08-14 MED ORDER — ACETAMINOPHEN 325 MG PO TABS
650.0000 mg | ORAL_TABLET | Freq: Four times a day (QID) | ORAL | Status: DC | PRN
  Administered 2021-08-16 – 2021-08-18 (×2): 650 mg via ORAL
  Filled 2021-08-14 (×2): qty 2

## 2021-08-14 MED ORDER — LACTATED RINGERS IV SOLN: INTRAVENOUS | Status: DC

## 2021-08-14 MED ORDER — FENOFIBRATE 160 MG PO TABS
160.0000 mg | ORAL_TABLET | Freq: Every day | ORAL | Status: DC
  Administered 2021-08-15 – 2021-08-20 (×6): 160 mg via ORAL
  Filled 2021-08-14 (×7): qty 1

## 2021-08-14 MED ORDER — LACTATED RINGERS IV BOLUS (SEPSIS)
500.0000 mL | Freq: Once | INTRAVENOUS | Status: AC
  Administered 2021-08-14: 500 mL via INTRAVENOUS

## 2021-08-14 MED ORDER — MOMETASONE FURO-FORMOTEROL FUM 100-5 MCG/ACT IN AERO
2.0000 | INHALATION_SPRAY | Freq: Two times a day (BID) | RESPIRATORY_TRACT | Status: DC
  Administered 2021-08-15 – 2021-08-20 (×10): 2 via RESPIRATORY_TRACT
  Filled 2021-08-14: qty 8.8

## 2021-08-14 MED ORDER — VANCOMYCIN HCL 1250 MG/250ML IV SOLN: 1250.0000 mg | INTRAVENOUS | Status: DC

## 2021-08-14 MED ORDER — IOHEXOL 300 MG/ML  SOLN
100.0000 mL | Freq: Once | INTRAMUSCULAR | Status: AC | PRN
  Administered 2021-08-14: 100 mL via INTRAVENOUS

## 2021-08-14 MED ORDER — INSULIN ASPART 100 UNIT/ML IJ SOLN
0.0000 [IU] | Freq: Three times a day (TID) | INTRAMUSCULAR | Status: DC
  Administered 2021-08-15 – 2021-08-16 (×5): 2 [IU] via SUBCUTANEOUS
  Administered 2021-08-17: 1 [IU] via SUBCUTANEOUS
  Administered 2021-08-17 (×2): 2 [IU] via SUBCUTANEOUS
  Administered 2021-08-18: 1 [IU] via SUBCUTANEOUS
  Administered 2021-08-18 (×2): 2 [IU] via SUBCUTANEOUS
  Administered 2021-08-19: 3 [IU] via SUBCUTANEOUS
  Administered 2021-08-19 (×2): 2 [IU] via SUBCUTANEOUS
  Administered 2021-08-20: 1 [IU] via SUBCUTANEOUS

## 2021-08-14 NOTE — Op Note (Signed)
[facility] Patient Name: Garrett [name] Procedure Date: 08/14/2021 7:08 AM MRN: [id number] Endoscopist: Milus Banister , MD Age: 70 Referring MD:  Date of Birth: [birth year] Gender: Male Account #: [id number] Procedure:                Colonoscopy Indications:              High risk colon cancer surveillance: Personal                            history of colonic polyps: Colonoscopy 2016 two                            subCM adenomas removed; loose stools for several                            months Medicines:                Monitored Anesthesia Care Procedure:                Pre-Anesthesia Assessment:                           - Prior to the procedure, a History and Physical                            was performed, and patient medications and                            allergies were reviewed. The patient's tolerance of                            previous anesthesia was also reviewed. The risks                            and benefits of the procedure and the sedation                            options and risks were discussed with the patient.                            All questions were answered, and informed consent                            was obtained. Prior Anticoagulants: The patient has                            taken Xarelto (rivaroxaban), last dose was 3 days                            prior to procedure. ASA Grade Assessment: III - A                            patient with severe systemic disease. After  reviewing the risks and benefits, the patient was                            deemed in satisfactory condition to undergo the                            procedure.                           After obtaining informed consent, the colonoscope                            was passed under direct vision. Throughout the                            procedure, the patient's blood pressure, pulse, and                            oxygen  saturations were monitored continuously. The                            Olympus CF-HQ190L [id number]) Colonoscope was                            introduced through the anus and advanced to the the                            terminal ileum. The colonoscopy was performed                            without difficulty. The patient tolerated the                            procedure well. The quality of the bowel                            preparation was good. The terminal ileum was                            photographed. Scope In: 8:12:38 AM Scope Out: 8:33:22 AM Scope Withdrawal Time: 0 hours 17 minutes 10 seconds  Total Procedure Duration: 0 hours 20 minutes 44 seconds  Findings:                 The terminal ileum appeared normal.                           Biopsies for histology were taken with a cold                            forceps from the entire colon for evaluation of                            microscopic colitis.  Two sessile polyps were found in the transverse                            colon and ascending colon. The polyps were 3 to 5                            mm in size. These polyps were removed with a cold                            snare. Resection and retrieval were complete.                           Multiple small and large-mouthed diverticula were                            found in the left colon.                           The exam was otherwise without abnormality on                            direct and retroflexion views. Complications:            No immediate complications. Estimated blood loss:                            None. Estimated Blood Loss:     Estimated blood loss: none. Impression:               - The examined portion of the ileum was normal.                           - Two 3 to 5 mm polyps in the transverse colon and                            in the ascending colon, removed with a cold snare.                             Resected and retrieved.                           - Diverticulosis in the left colon.                           - The examination was otherwise normal on direct                            and retroflexion views.                           - Biopsies were taken with a cold forceps from the                            entire colon for evaluation of microscopic colitis. Recommendation:           -  Await pathology results.                           - EGD now. Milus Banister, MD 08/14/2021 8:49:59 AM This report has been signed electronically.

## 2021-08-14 NOTE — ED Notes (Signed)
PT O2 turned down to 2L [place]

## 2021-08-14 NOTE — Patient Instructions (Signed)
Ok to resume blood thinner medication today  FOLLOW DILATATION DIET GIVEN TO YOU TODAY  HANDOUTS ON POLYPS AND DIVERTICULOSIS GIVEN TO YOU TODAY  AWAIT PATHOLOGY RESULTS OF POLYPS REMOVED     YOU HAD AN ENDOSCOPIC PROCEDURE TODAY AT [place]:   Refer to the procedure report that was given to you for any specific questions about what was found during the examination.  If the procedure report does not answer your questions, please call your gastroenterologist to clarify.  If you requested that your care partner not be given the details of your procedure findings, then the procedure report has been included in a sealed envelope for you to review at your convenience later.  YOU SHOULD EXPECT: Some feelings of bloating in the abdomen. Passage of more gas than usual.  Walking can help get rid of the air that was put into your GI tract during the procedure and reduce the bloating. If you had a lower endoscopy (such as a colonoscopy or flexible sigmoidoscopy) you may notice spotting of blood in your stool or on the toilet paper. If you underwent a bowel prep for your procedure, you may not have a normal bowel movement for a few days.  Please Note:  You might notice some irritation and congestion in your nose or some drainage.  This is from the oxygen used during your procedure.  There is no need for concern and it should clear up in a day or so.  SYMPTOMS TO REPORT IMMEDIATELY:  Following lower endoscopy (colonoscopy or flexible sigmoidoscopy):  Excessive amounts of blood in the stool  Significant tenderness or worsening of abdominal pains  Swelling of the abdomen that is new, acute  Fever of 100F or higher  Following upper endoscopy (EGD)  Vomiting of blood or coffee ground material  New chest pain or pain under the shoulder blades  Painful or persistently difficult swallowing  New shortness of breath  Fever of 100F or higher  Black, tarry-looking stools  For urgent  or emergent issues, a gastroenterologist can be reached at any hour by calling [phone]. Do not use MyChart messaging for urgent concerns.    DIET:  FOLLOW DILATATION DIET TODAY( GIVEN TO YOU) then you may proceed to your regular diet TOMORROW Drink plenty of fluids but you should avoid alcoholic beverages for 24 hours.  ACTIVITY:  You should plan to take it easy for the rest of today and you should NOT DRIVE or use heavy machinery until tomorrow (because of the sedation medicines used during the test).    FOLLOW UP: Our staff will call the number listed on your records 48-72 hours following your procedure to check on you and address any questions or concerns that you may have regarding the information given to you following your procedure. If we do not reach you, we will leave a message.  We will attempt to reach you two times.  During this call, we will ask if you have developed any symptoms of COVID 19. If you develop any symptoms (ie: fever, flu-like symptoms, shortness of breath, cough etc.) before then, please call [phone].  If you test positive for Covid 19 in the 2 weeks post procedure, please call and report this information to [place].    If any biopsies were taken you will be contacted by phone or by letter within the next 1-3 weeks.  Please call us at [phone] if you have not heard about the biopsies in 3 weeks.  SIGNATURES/CONFIDENTIALITY: You and/or your care partner have signed paperwork which will be entered into your electronic medical record.  These signatures attest to the fact that that the information above on your After Visit Summary has been reviewed and is understood.  Full responsibility of the confidentiality of this discharge information lies with you and/or your care-partner.

## 2021-08-14 NOTE — Progress Notes (Signed)
Called to room to assist during endoscopic procedure.  Patient ID and intended procedure confirmed with present staff. Received instructions for my participation in the procedure from the performing physician.  

## 2021-08-14 NOTE — Progress Notes (Signed)
To PACU, VSS. Report to [email or web address] 

## 2021-08-14 NOTE — Sepsis Progress Note (Signed)
Dr. Velia Meyer made aware of 2nd lactic acid of 3.5 with plans to check additional lactic in AM

## 2021-08-14 NOTE — Progress Notes (Signed)
Brief note regarding preliminary plan, with full H&P to follow:  70 year old male who underwent EGD and colonoscopy as outpatient with [facility] earlier today, who is being admitted with severe sepsis in the setting of suspected left lower lobe pneumonia after presenting with subjective fever.  Chest x-ray suggestive of left lower lobe infiltrate concerning for pneumonia.  Initial lactate elevated at 3.7.  No reported significant abdominal discomfort.  CT abdomen/pelvis ordered to evaluate for any perforation given temporal proximity to today's endoscopic evaluations.  Blood cultures x2.  No evidence of hypotension.  Receiving continuous IV fluids.  Repeat lactate ordered, with result currently pending.  Received IV Vanco, cefepime, and Flagyl in the ED.  Can likely de-escalate to CAP coverage, but will wait for results of CT abdomen/pelvis before making this change. On-call [facility] notified, and are following for CT scan results.     Babs Bertin, DO Hospitalist
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
## 2021-08-14 NOTE — ED Triage Notes (Signed)
Pt presents today from home after having a colonoscopy/endoscopy this morning at 0815. At approximately 1230 pt began feeling febrile with abdominal and back pain. Pt RA O2 89% was placed on NRB PTA. PT now on [place] 5L

## 2021-08-14 NOTE — Telephone Encounter (Signed)
I spoke with his wife.  He is very uncomfortable, mainly complaining of low back pain.  He is denying chest pain or abdominal pain however he is groaning in the background.  She has taken his temperature twice and it is up to 102.  I am not sure what has happened but I am concerned about possible perforation, perhaps a splenic laceration although that generally does not cause a fever.  He is clearly quite uncomfortable on the phone.  I recommended she take him directly to the emergency room.

## 2021-08-14 NOTE — Progress Notes (Signed)
I met up with him and his wife in the ER.  Looks like he has pneumonia on CXR and CT, this fits clinically.  I don't recall him aspirating during his procedures this morning but his admitting O2 sat was low normal.  This may certainly be a procedure related complication or perhaps related to his periodic vomiting that he's had since [facility] repair. He and Bethena Roys recall choking, vomiting on eggs two days ago. He is being admitted for IV AB, O2. I greatly appreciate the ER and upcoming hospitalist care. We will follow peripherally.

## 2021-08-14 NOTE — Progress Notes (Signed)
Patient had uneventful EGD and colonoscopy this am. EGD with dilation was done for dysphagia post fundoplication (in March 123456) and colonoscopy  for polyp surveillance. See findings below  Colonoscopy :  --The examined portion of the ileum was normal. - Two 3 to 5 mm polyps in the transverse colon and in the ascending colon, removed with a cold snare. Resected and retrieved. - Diverticulosis in the left colon. - The examination was otherwise normal on direct and retroflexion views. - Biopsies were taken with a cold forceps from the entire colon for evaluation of microscopic colitis.  EGD: The esophagus is tortuous and foreshortened with apparent recurrent 3-4cm hiatal hernia segment. There is visible black suture material attatched to the distal esophagus (see images). The GE junction did not seem significantly stenotic however given his symptoms I elected to dilate it. A TTS dilator was passed through the scope. Dilation with an 18-19-20 mm balloon dilator was performed to 20 mm. The exam was otherwise without abnormality.   Patient has just gotten into ED exam room. He is afebrile right now. He is tachycardic. Normal respiratory rate at present.  02 sat was 89% earlier. He was placed on non-rebreather ( off now).  Oral cavity extremely dry ( ? From non-rebreather).  Abdomen is soft, only mild tenderness in LUQ, a few bowel sounds are present. Labs being drawn  Wife Bethena Roys in room.  Within a couple of hours of being home from procedures patient developed a fever ~ 102. Wife describes him having difficulty catching his breath around the same time.  No abdominal pain but complains of left lower back pain and LLE pain which are not new but worse than usual. Additionally he has some LLE twitching which wife says is new and started today   I spoke with EDP, Dr. Vanita Panda. Awaiting labs. Plan is for imaging to rule out aspiration, perforation, splenic laceration or other. Dr. Silverio Decamp is on call for  practice this evening and she is aware of the patient.

## 2021-08-14 NOTE — Telephone Encounter (Signed)
Pt's wife called @ 1435 to say pt is running a fever 101.4 and moaning and groaning the patient's wife,Judy, unable to tell me what is hurting the pt the patient did deny chest pain. S Monday informed Dr [name] of this call and Dr [name] will call pt back.

## 2021-08-14 NOTE — Op Note (Signed)
[facility] Patient Name: Garrett [name] Procedure Date: 08/14/2021 7:08 AM MRN: [id number] Endoscopist: Milus Banister , MD Age: 70 Referring MD:  Date of Birth: 04/04/1951 Gender: Male Account #: [id number] Procedure:                Upper GI endoscopy Indications:              Dysphagia following third fundoplication 123456 Medicines:                Monitored Anesthesia Care Procedure:                Pre-Anesthesia Assessment:                           - Prior to the procedure, a History and Physical                            was performed, and patient medications and                            allergies were reviewed. The patient's tolerance of                            previous anesthesia was also reviewed. The risks                            and benefits of the procedure and the sedation                            options and risks were discussed with the patient.                            All questions were answered, and informed consent                            was obtained. Prior Anticoagulants: The patient has                            taken Xarelto (rivaroxaban), last dose was 3 days                            prior to procedure. ASA Grade Assessment: III - A                            patient with severe systemic disease. After                            reviewing the risks and benefits, the patient was                            deemed in satisfactory condition to undergo the                            procedure.  After obtaining informed consent, the endoscope was                            passed under direct vision. Throughout the                            procedure, the patient's blood pressure, pulse, and                            oxygen saturations were monitored continuously. The                            Endoscope was introduced through the mouth, and                            advanced to the second part of duodenum.  The upper                            GI endoscopy was accomplished without difficulty.                            The patient tolerated the procedure well. Scope In: Scope Out: Findings:                 The esopahgus is tortuous and foreshortened with                            apparent recurrent 3-4cm hiatal hernia segment.                            There is visible black suture material attatched to                            the distal esophagus (see imgages). The GE junction                            did not seem significantly stenotic however given                            his symptoms I elected to dilate it. A TTS dilator                            was passed through the scope. Dilation with an                            18-19-20 mm balloon dilator was performed to 20 mm.                           The exam was otherwise without abnormality. Complications:            No immediate complications. Estimated blood loss:                            None. Estimated Blood Loss:  Estimated blood loss: none. Impression:               - It looks like the hiatal hernia has recurred and                            there is visible suture material communicating with                            the distal esophagus. The recurrent HH is causing                            significant tortuousity of the distal esophagus,                            proximal stomach and this is what I feel is likely                            causing his dysphagia however to be more certain I                            empirically dilated the GE junction to 3m with                            CRE balloon. Recommendation:           - Patient has a contact number available for                            emergencies. The signs and symptoms of potential                            delayed complications were discussed with the                            patient. Return to normal activities tomorrow.                             Written discharge instructions were provided to the                            patient.                           - Resume previous diet.                           - Continue present medications.                           - My office will arrange a barium esophagram , UGI                            to understand the anatomy best.                           -  Dr. [name] will communicate these results with                            Dr. [name].                           - OK to resume your blood thinner today. Milus Banister, MD 08/14/2021 8:59:07 AM This report has been signed electronically.
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
## 2021-08-14 NOTE — Sepsis Progress Note (Signed)
Sepsis protocol followed by [facility] 

## 2021-08-14 NOTE — H&P (Signed)
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 sepsis to be considered severe in nature are met on the basis of concomitant acute hypoxic respiratory failure, and initial lactate found to be elevated to 3.7, with repeat trending down to 3.5 following administration of greater than 30 mL/kg IVF bolus relative to his ideal body weight of 71 kg. Not overtly hypotensive in the ED.  Blood cultures x2 collected in the ED prior to initiation of IV vancomycin, cefepime, and Flagyl.  Given the severe nature of his sepsis as well  as accompanying acute hypoxic respiratory failure, will continue these broad-spectrum antibiotics for now.  Check MRSA PCR, which, if negative, can likely discontinue IV Vanc.  No evidence of additional underlying infectious process at this time, including COVID-19 PCR found to be negative today.  Urinalysis  has been ordered, with result currently pending.  Of note CT chest, abdomen/pelvis shows no evidence of esophageal or bowel perforation, notable in the setting of EGD/colonoscopy performed earlier today.  Plan: We will reduce rate of lactated Ringer's to 125 cc/h in the setting of his history of chronic diastolic heart failure, will closely monitoring for ensuing development of acute volume overload in the setting of a known history of chronic diastolic heart failure.  Repeat lactate ordered.  Continue Vanco cefepime, Flagyl for now, as above.  Monitor for results of blood cultures x2.  Repeat CBC with differential in the morning.  Add on procalcitonin.  Check serum magnesium and phosphorus levels.  Check strep pneumonia urine antigen and Legionella urine antigens.  Follow-up results of urinalysis.  Flutter valve and incentive spirometry.  Monitor on telemetry.  Monitor continuous pulse oximetry.      #) Acute hypoxic respiratory failure: In setting of patient's reported no baseline supplemental oxygen requirements, initial O2 sats in the high 80s, with ensuing improvement into the mid to high 90s on 5 L nasal cannula.  Appears to have a sense of left-sided pneumonia, as detailed above.  Acute PE appears less likely given good compliance with chronic anticoagulation on Xarelto.  Clinically and radiographically presentation appears less suggestive of acutely decompensated heart failure.  ACS is felt to be less likely at this time.  Presenting EKG in the setting of initial sinus tachycardia prior to administration of IV fluids nonspecific findings, in the absence of any ST elevation, as further detailed above. Will repeat EKG with improving heart rate.  Patient denies any confirmed history of underlying COPD, although degree of chronic obstructive underlying lung disease is possible given his greater than 90-pack-year history of smoking.   Plan: Further evaluation management of severe sepsis due to  left-sided pneumonia, as above, including broad-spectrum IV antibiotics.  Check blood gas.  Continue home Symbicort as well as as needed albuterol inhaler.  Monitor continuous pulse oximetry.  Add on serum magnesium and phosphorus levels.  Flutter valve and incentive spirometry.  Monitor on telemetry.  Repeat EKG, as above.      #) Hypokalemia: Presenting potassium found to be 3.0.  Suspect contribution from increased stool output over the course the last day as a consequence of his bowel prep for scheduled colonoscopy.  Plan: Potassium chloride 30 mEq IV every 3 hours x1 dose now.  Repeat CMP in the morning.  Add on serum magnesium level.  Monitor on telemetry.      #) Chronic diastolic heart failure: Documented history of such, with most recent echocardiogram in January 2020 notable for LVEF 60%, no evidence of LVH, while showing grade 1 diastolic dysfunction as well as no evidence of valvular pathology.  He is on Lasix 40 mg p.o. every morning as well as 20 mg p.o. nightly.  Clinically, today's presentation appears less suggestive of acutely decompensated heart failure, particularly given the patient's report of associated subjective fever and chills.  Denies any associated chest pain. Will provide continuous IV fluids for now in the setting of severe sepsis and elevated lactate , but with reduction in rate, as quantified above while closely monitoring ensuing volume status, as below.   Plan: Monitor  strict I's and O's and daily weights.  Add on BNP.  Add on serum magnesium level.  Holding home Lasix for now.        #) Type 2 diabetes mellitus: Non-insulin-dependent at home.  On metformin and dapagliflozin as a sole outpatient oral hypoglycemic agents.  Presenting blood sugar noted to be 224 without evidence of anion gap metabolic acidosis.  Plan: Hold home oral hypoglycemic agents during this hospitalization.  Accu-Cheks before every meal and at bedtime with low-dose sliding scale  insulin.      #) GERD: On omeprazole and Pepcid as an outpatient.  Plan: Continue PPI and H2 blocker.      #) History of prior unprovoked acute pulmonary emboli: Diagnosed with at least 2 prior unprovoked acute pulmonary emboli, most recently in January 2020, prompting initiation of chronic anticoagulation on Xarelto, following which the patient denies any ensuing acute PE.  Clinically, presentation less suggestive of new acute PE.  Plan: Resume home Xarelto.      #) Essential hypertension: Documented history of such with outpatient antihypertensive regimen including Lopressor as well as verapamil.  In the setting of presenting severe sepsis, will hold home antihypertensive medications for now.   Plan: Hold home antihypertensive medications for now, as above.  Close monitoring of ensuing blood pressure via routine vital signs.      #) Hyperlipidemia: On atorvastatin and fenofibrate as an outpatient.  Plan: Continue home fenofibrate and atorvastatin.    DVT prophylaxis: SCDs plus resume home Xarelto Code Status: Full code Family Communication: none Disposition Plan: Per Rounding Team Consults called: EDP notifiied on-cal [facility] of this case, as further detailed above;  Admission status: Inpatient     Of note, this patient was added by me to the following Admit List/Treatment Team: [email or web address].      PLEASE NOTE THAT DRAGON DICTATION SOFTWARE WAS USED IN THE CONSTRUCTION OF THIS NOTE.   [facility] Pager [phone] From [place]  Otherwise, please contact night-coverage  [email or web address] Password [facility]   08/14/2021, 8:59 PM

## 2021-08-14 NOTE — Progress Notes (Signed)
Pt's states no medical or surgical changes since previsit or office visit.  Vitals DT 

## 2021-08-14 NOTE — Progress Notes (Signed)
Pharmacy Antibiotic Note  Garrett [name] is a 70 y.o. male admitted on 08/14/2021 with sepsis.  Pharmacy has been consulted for cefepime and vancomycin dosing.  Patient had endoscopy, colonoscopy, and esophageal dilation performed earlier today prior to arrival. Patient presenting with chest pain and fever.  SCr 1.34 which is slightly above baseline for patient. WBC 11; LA 3.7  Plan: Metronidazole per MD Cefepime 2g q12hr unless change in renal function Vancomycin 2000 mg once then 1250 mg q24hr unless change in renal function (eAUC 530.7 using SCr of 1.34) Monitor renal function and adjust antibiotics accordingly F/u cultures De-escalate antibiotics as indicated     Temp (24hrs), Avg:97.8 F (36.6 C), Min:97.8 F (36.6 C), Max:97.8 F (36.6 C)  No results for input(s): WBC, CREATININE, LATICACIDVEN, VANCOTROUGH, VANCOPEAK, VANCORANDOM, GENTTROUGH, GENTPEAK, GENTRANDOM, TOBRATROUGH, TOBRAPEAK, TOBRARND, AMIKACINPEAK, AMIKACINTROU, AMIKACIN in the last 168 hours.  CrCl cannot be calculated (Patient's most recent lab result is older than the maximum 21 days allowed.).    Allergies  Allergen Reactions   Bee Venom Anaphylaxis   Garlic Swelling   Antimicrobials this admission: Vancomycin 8/23 >>  Cefepime 8/23 >>   Microbiology results: Pending  Thank you for allowing pharmacy to be a part of this patient's care.  Lorelei Pont, PharmD, BCPS 08/14/2021 5:31 PM ED Clinical Pharmacist -  [phone]

## 2021-08-15 ENCOUNTER — Other Ambulatory Visit: Payer: Self-pay

## 2021-08-15 ENCOUNTER — Ambulatory Visit: Payer: Medicare Other

## 2021-08-15 DIAGNOSIS — R1310 Dysphagia, unspecified: Secondary | ICD-10-CM

## 2021-08-15 LAB — CBC WITH DIFFERENTIAL/PLATELET
Abs Immature Granulocytes: 0.08 10*3/uL — ABNORMAL HIGH (ref 0.00–0.07)
Basophils Absolute: 0 10*3/uL (ref 0.0–0.1)
Basophils Relative: 0 %
Eosinophils Absolute: 0 10*3/uL (ref 0.0–0.5)
Eosinophils Relative: 0 %
HCT: 35.6 % — ABNORMAL LOW (ref 39.0–52.0)
Hemoglobin: 11 g/dL — ABNORMAL LOW (ref 13.0–17.0)
Immature Granulocytes: 1 %
Lymphocytes Relative: 7 %
Lymphs Abs: 1.1 10*3/uL (ref 0.7–4.0)
MCH: 28.3 pg (ref 26.0–34.0)
MCHC: 30.9 g/dL (ref 30.0–36.0)
MCV: 91.5 fL (ref 80.0–100.0)
Monocytes Absolute: 0.8 10*3/uL (ref 0.1–1.0)
Monocytes Relative: 5 %
Neutro Abs: 13.6 10*3/uL — ABNORMAL HIGH (ref 1.7–7.7)
Neutrophils Relative %: 87 %
Platelets: 219 10*3/uL (ref 150–400)
RBC: 3.89 MIL/uL — ABNORMAL LOW (ref 4.22–5.81)
RDW: 16.6 % — ABNORMAL HIGH (ref 11.5–15.5)
WBC: 15.5 10*3/uL — ABNORMAL HIGH (ref 4.0–10.5)
nRBC: 0 % (ref 0.0–0.2)

## 2021-08-15 LAB — COMPREHENSIVE METABOLIC PANEL
ALT: 39 U/L (ref 0–44)
AST: 28 U/L (ref 15–41)
Albumin: 2.9 g/dL — ABNORMAL LOW (ref 3.5–5.0)
Alkaline Phosphatase: 39 U/L (ref 38–126)
Anion gap: 8 (ref 5–15)
BUN: 12 mg/dL (ref 8–23)
CO2: 21 mmol/L — ABNORMAL LOW (ref 22–32)
Calcium: 8.9 mg/dL (ref 8.9–10.3)
Chloride: 111 mmol/L (ref 98–111)
Creatinine, Ser: 1.31 mg/dL — ABNORMAL HIGH (ref 0.61–1.24)
GFR, Estimated: 58 mL/min — ABNORMAL LOW (ref >60)
Glucose, Bld: 178 mg/dL — ABNORMAL HIGH (ref 70–99)
Potassium: 3.7 mmol/L (ref 3.5–5.1)
Sodium: 140 mmol/L (ref 135–145)
Total Bilirubin: 0.8 mg/dL (ref 0.3–1.2)
Total Protein: 5.5 g/dL — ABNORMAL LOW (ref 6.5–8.1)

## 2021-08-15 LAB — GLUCOSE, CAPILLARY
Glucose-Capillary: 157 mg/dL — ABNORMAL HIGH (ref 70–99)
Glucose-Capillary: 186 mg/dL — ABNORMAL HIGH (ref 70–99)
Glucose-Capillary: 166 mg/dL — ABNORMAL HIGH (ref 70–99)

## 2021-08-15 LAB — I-STAT ARTERIAL BLOOD GAS, ED
Acid-base deficit: 5 mmol/L — ABNORMAL HIGH (ref 0.0–2.0)
Bicarbonate: 18.8 mmol/L — ABNORMAL LOW (ref 20.0–28.0)
Calcium, Ion: 1.29 mmol/L (ref 1.15–1.40)
HCT: 31 % — ABNORMAL LOW (ref 39.0–52.0)
Hemoglobin: 10.5 g/dL — ABNORMAL LOW (ref 13.0–17.0)
O2 Saturation: 93 %
Patient temperature: 97.9
Potassium: 3.2 mmol/L — ABNORMAL LOW (ref 3.5–5.1)
Sodium: 143 mmol/L (ref 135–145)
TCO2: 20 mmol/L — ABNORMAL LOW (ref 22–32)
pCO2 arterial: 28.8 mmHg — ABNORMAL LOW (ref 32.0–48.0)
pH, Arterial: 7.42 (ref 7.350–7.450)
pO2, Arterial: 64 mmHg — ABNORMAL LOW (ref 83.0–108.0)

## 2021-08-15 LAB — LACTIC ACID, PLASMA
Lactic Acid, Venous: 4.2 mmol/L (ref 0.5–1.9)
Lactic Acid, Venous: 2.8 mmol/L (ref 0.5–1.9)

## 2021-08-15 LAB — MRSA NEXT GEN BY PCR, NASAL: MRSA by PCR Next Gen: NOT DETECTED

## 2021-08-15 LAB — MAGNESIUM
Magnesium: 1.8 mg/dL (ref 1.7–2.4)
Magnesium: 1.8 mg/dL (ref 1.7–2.4)

## 2021-08-15 LAB — PROCALCITONIN: Procalcitonin: 6.03 ng/mL

## 2021-08-15 LAB — PHOSPHORUS: Phosphorus: 2.7 mg/dL (ref 2.5–4.6)

## 2021-08-15 LAB — CBG MONITORING, ED: Glucose-Capillary: 145 mg/dL — ABNORMAL HIGH (ref 70–99)

## 2021-08-15 LAB — BRAIN NATRIURETIC PEPTIDE: B Natriuretic Peptide: 243.8 pg/mL — ABNORMAL HIGH (ref 0.0–100.0)

## 2021-08-15 MED ORDER — POTASSIUM CHLORIDE CRYS ER 20 MEQ PO TBCR
40.0000 meq | EXTENDED_RELEASE_TABLET | Freq: Once | ORAL | Status: AC
  Administered 2021-08-15: 40 meq via ORAL
  Filled 2021-08-15: qty 2

## 2021-08-15 NOTE — Progress Notes (Signed)
PROGRESS NOTE    [name]  [id number] DOB: 1951/11/16 DOA: 08/14/2021 PCP: Ann Held, DO    Chief Complaint  Patient presents with   Shortness of Breath        Abdominal Pain   Post-op Problem    Brief Narrative:   BOND GOLDFINE is a 70 y.o. male with medical history significant for chronic diastolic heart failure, type 2 diabetes mellitus, multiple prior unprovoked pulmonary emboli chronically anticoagulated on Xarelto, hyperlipidemia, hypertension, who is admitted to [facility] on 08/14/2021 with severe sepsis due to left-sided pneumonia after presenting from home to [facility] ED complaining of fever.  He underwent outpatient colonoscopy and EGD earlier today via Dr. [name] of [facility], with EGD indication for esophageal dilation in the setting of a history of esophageal stricture, while colonoscopy was performed reportedly for repeat routine screening purposes, with chart review revealing documentation of most recent previous colonoscopy in 2010.  No reported intraoperative complications, per chart review.  Patient was subsequently discharged to home from same-day surgery, however in the setting of progression of his subjective fever, chills, which he states started prior to undergoing colonoscopy/EGD today, he presented to [facility] emergency department for further evaluation    Assessment & Plan:   Principal Problem:   Severe sepsis [facility]) Active Problems:   Controlled type 2 diabetes mellitus with diabetic nephropathy ([place])   Essential hypertension   GERD (gastroesophageal reflux disease)   Hyperlipidemia associated with type 2 diabetes mellitus ([place])   Acute respiratory failure with hypoxia ([place])   Pneumonia   Lactic acidosis   Hypokalemia   Severe sepsis secondary to left-sided pneumonia Started the patient on broad-spectrum IV antibiotics and transition to IV cefepime and Flagyl. MRSA PCR is negative, can discontinue IV vancomycin. CT chest  abdomen pelvis does not show any evidence of esophageal or bowel perforation.  Repeat lactic acid have improved. Continue with incentive spirometry and flutter valve. Follow-up strep pneumonia and urine Legionella antigen Plan to wean him off the oxygen in the next 24 to 48 hours.    Acute respiratory failure with hypoxia secondary to pneumonia. Continue with IV antibiotics and wean him off the oxygen.   History of chronic diastolic heart failure Last echocardiogram in January 2020 showed preserved left ventricular ejection fraction with grade 1 diastolic dysfunction. Continue strict intake and output and daily weights.   Non-insulin-dependent type 2 diabetes mellitus Continue with sliding scale insulin inpatient. CBG (last 3)  Recent Labs    08/15/21 0816 08/15/21 1159 08/15/21 1637  GLUCAP 145* 157* 186*     Hypertension Blood pressure parameters appear to be optimal at this time.     History of unprovoked acute pulmonary embolism Continue with Xarelto.   GERD Continue with PPI.  Hypokalemia Replaced  Hyperlipidemia Continue with statin on discharge.   DVT prophylaxis: Xarelto Code Status: (Full code) Family Communication: family at bedside.  Disposition:   Status is: Inpatient  Remains inpatient appropriate because:Unsafe d/c plan and IV treatments appropriate due to intensity of illness or inability to take PO  Dispo: The patient is from: Home              Anticipated d/c is to: Home              Patient currently is not medically stable to d/c.   Difficult to place patient No       Consultants:  None  Procedures: none.   Antimicrobials:  Antibiotics Given (  last 72 hours)     Date/Time Action Medication Dose Rate   08/14/21 1750 New Bag/Given   ceFEPIme (MAXIPIME) 2 g in sodium chloride 0.9 % 100 mL IVPB 2 g 200 mL/hr   08/14/21 1822 New Bag/Given   metroNIDAZOLE (FLAGYL) IVPB 500 mg 500 mg 100 mL/hr   08/14/21 1956 New Bag/Given    vancomycin (VANCOREADY) IVPB 2000 mg/400 mL 2,000 mg 200 mL/hr   08/14/21 2338 New Bag/Given   metroNIDAZOLE (FLAGYL) IVPB 500 mg 500 mg 100 mL/hr   08/15/21 0522 New Bag/Given   ceFEPIme (MAXIPIME) 2 g in sodium chloride 0.9 % 100 mL IVPB 2 g 200 mL/hr   08/15/21 [id number] New Bag/Given   metroNIDAZOLE (FLAGYL) IVPB 500 mg 500 mg 100 mL/hr   08/15/21 1444 New Bag/Given   metroNIDAZOLE (FLAGYL) IVPB 500 mg 500 mg 100 mL/hr         Subjective: Breathing better.   Objective: Vitals:   08/15/21 0945 08/15/21 1055 08/15/21 1122 08/15/21 1127  BP:  115/66  113/69  Pulse:  73  73  Resp:  18  20  Temp: 98 F (36.7 C) 98.5 F (36.9 C)  98.3 F (36.8 C)  TempSrc: Oral Oral  Oral  SpO2:  97%  98%  Weight:   103.6 kg   Height:   '5\' 9"'$  (1.753 m)     Intake/Output Summary (Last 24 hours) at 08/15/2021 1617 Last data filed at 08/15/2021 1400 Gross per 24 hour  Intake 281.77 ml  Output 1700 ml  Net -1418.23 ml   Filed Weights   08/15/21 1122  Weight: 103.6 kg    Examination:  General exam: Appears calm and comfortable  Respiratory system: Clear to auscultation. Respiratory effort normal. Cardiovascular system: S1 & S2 heard, RRR. No JVD, . No pedal edema. Gastrointestinal system: Abdomen is nondistended, soft and nontender. . Normal bowel sounds heard. Central nervous system: Alert and oriented. No focal neurological deficits. Extremities: Symmetric 5 x 5 power. Skin: No rashes, lesions or ulcers Psychiatry:  Mood & affect appropriate.     Data Reviewed: I have personally reviewed following labs and imaging studies  CBC: Recent Labs  Lab 08/14/21 1651 08/15/21 0350 08/15/21 0511  WBC 11.0* 15.5*  --   NEUTROABS 10.0* 13.6*  --   HGB 12.7* 11.0* 10.5*  HCT 39.5 35.6* 31.0*  MCV 88.8 91.5  --   PLT 236 219  --     Basic Metabolic Panel: Recent Labs  Lab 08/14/21 1651 08/15/21 0350 08/15/21 0511 08/15/21 1152  NA 136 140 143  --   K 3.0* 3.7 3.2*  --   CL  108 111  --   --   CO2 18* 21*  --   --   GLUCOSE 229* 178*  --   --   BUN 14 12  --   --   CREATININE 1.34* 1.31*  --   --   CALCIUM 8.7* 8.9  --   --   MG  --  1.8  --  1.8  PHOS  --  2.7  --   --     GFR: Estimated Creatinine Clearance: 61.4 mL/min (A) (by C-G formula based on SCr of 1.31 mg/dL (H)).  Liver Function Tests: Recent Labs  Lab 08/14/21 1651 08/15/21 0350  AST 51* 28  ALT 52* 39  ALKPHOS 44 39  BILITOT 0.7 0.8  PROT 6.2* 5.5*  ALBUMIN 3.3* 2.9*    CBG: Recent Labs  Lab 08/14/21 2344  08/15/21 0816 08/15/21 1159  GLUCAP 178* 145* 157*     Recent Results (from the past 240 hour(s))  Blood Culture (routine x 2)     Status: None (Preliminary result)   Collection Time: 08/14/21  5:15 PM   Specimen: BLOOD RIGHT WRIST  Result Value Ref Range Status   Specimen Description BLOOD RIGHT WRIST  Final   Special Requests   Final    BOTTLES DRAWN AEROBIC AND ANAEROBIC Blood Culture adequate volume   Culture   Final    NO GROWTH < 12 HOURS Performed at [facility], [place] [address], [place], [place] [address]    Report Status PENDING  Incomplete  Blood Culture (routine x 2)     Status: None (Preliminary result)   Collection Time: 08/14/21  5:25 PM   Specimen: BLOOD  Result Value Ref Range Status   Specimen Description BLOOD SITE NOT SPECIFIED  Final   Special Requests   Final    BOTTLES DRAWN AEROBIC AND ANAEROBIC Blood Culture adequate volume   Culture   Final    NO GROWTH < 12 HOURS Performed at [facility], [place] [address], [place], [place] [address]    Report Status PENDING  Incomplete  Resp Panel by RT-PCR (Flu A&B, Covid) Nasopharyngeal Swab     Status: None   Collection Time: 08/14/21  5:54 PM   Specimen: Nasopharyngeal Swab; Nasopharyngeal(NP) swabs in vial transport medium  Result Value Ref Range Status   SARS Coronavirus 2 by RT PCR NEGATIVE NEGATIVE Final    Comment: (NOTE) SARS-CoV-2 target nucleic acids are NOT DETECTED.  The  SARS-CoV-2 RNA is generally detectable in upper respiratory specimens during the acute phase of infection. The lowest concentration of SARS-CoV-2 viral copies this assay can detect is 138 copies/mL. A negative result does not preclude SARS-Cov-2 infection and should not be used as the sole basis for treatment or other patient management decisions. A negative result may occur with  improper specimen collection/handling, submission of specimen other than nasopharyngeal swab, presence of viral mutation(s) within the areas targeted by this assay, and inadequate number of viral copies(<138 copies/mL). A negative result must be combined with clinical observations, patient history, and epidemiological information. The expected result is Negative.  Fact Sheet for Patients:  EntrepreneurPulse.com.au  Fact Sheet for Healthcare Providers:  IncredibleEmployment.be  This test is no t yet approved or cleared by the [place] FDA and  has been authorized for detection and/or diagnosis of SARS-CoV-2 by FDA under an Emergency Use Authorization (EUA). This EUA will remain  in effect (meaning this test can be used) for the duration of the COVID-19 declaration under Section 564(b)(1) of the Act, 21 U.S.C.section 360bbb-3(b)(1), unless the authorization is terminated  or revoked sooner.       Influenza A by PCR NEGATIVE NEGATIVE Final   Influenza B by PCR NEGATIVE NEGATIVE Final    Comment: (NOTE) The Xpert Xpress SARS-CoV-2/FLU/RSV plus assay is intended as an aid in the diagnosis of influenza from Nasopharyngeal swab specimens and should not be used as a sole basis for treatment. Nasal washings and aspirates are unacceptable for Xpert Xpress SARS-CoV-2/FLU/RSV testing.  Fact Sheet for Patients: EntrepreneurPulse.com.au  Fact Sheet for Healthcare Providers: IncredibleEmployment.be  This test is not yet approved or  cleared by the [place] FDA and has been authorized for detection and/or diagnosis of SARS-CoV-2 by FDA under an Emergency Use Authorization (EUA). This EUA will remain in effect (meaning this test can be  used) for the duration of the COVID-19 declaration under Section 564(b)(1) of the Act, 21 U.S.C. section 360bbb-3(b)(1), unless the authorization is terminated or revoked.  Performed at [facility], [place] [address], [place], [place] [address]   MRSA Next Gen by PCR, Nasal     Status: None   Collection Time: 08/15/21  3:23 AM   Specimen: Nasal Mucosa; Nasal Swab  Result Value Ref Range Status   MRSA by PCR Next Gen NOT DETECTED NOT DETECTED Final    Comment: (NOTE) The GeneXpert MRSA Assay (FDA approved for NASAL specimens only), is one component of a comprehensive MRSA colonization surveillance program. It is not intended to diagnose MRSA infection nor to guide or monitor treatment for MRSA infections. Test performance is not FDA approved in patients less than 81 years old. Performed at [facility], [place] [address] [place], [place] [address]          Radiology Studies: CT CHEST ABDOMEN PELVIS W CONTRAST  Result Date: 08/14/2021 CLINICAL DATA:  Esophageal perforation. History provided on radiograph earlier today states: Colonoscopy and endoscopy earlier today. EXAM: CT CHEST, ABDOMEN, AND PELVIS WITH CONTRAST TECHNIQUE: Multidetector CT imaging of the chest, abdomen and pelvis was performed following the standard protocol during bolus administration of intravenous contrast. CONTRAST:  174m OMNIPAQUE IOHEXOL 300 MG/ML  SOLN COMPARISON:  Radiographs earlier today, chest CT 09/29/2020, abdomen pelvis CT 07/31/2020 FINDINGS: CT CHEST FINDINGS Cardiovascular: Normal caliber thoracic aorta. Normal heart size. No pericardial effusion. There are coronary artery calcifications. Mediastinum/Nodes: There is no pneumomediastinum. No paraesophageal air. Minimal wall thickening  of the distal esophagus. There is no paraesophageal fluid. Scattered mediastinal lymph nodes are not enlarged by size criteria. No thyroid nodule. Lungs/Pleura: No pneumothorax. Patchy, nodular and ground-glass airspace disease throughout the left lung, greatest in the left lower lobe. Minimal patchy opacities in the right lower lobe. There is a bandlike opacity in the perifissural right lower and middle lobe. No pleural fluid. Scattered pulmonary nodules throughout the right lung, not significantly changed from prior exam, majority are pleural based. No debris within the tracheobronchial tree. Musculoskeletal: Thoracic spondylosis with multilevel endplate spurring. Sclerotic focus in the right anterior third rib, unchanged. Lucency within the posterior left second rib is unchanged. Lucent lesion within the right scapula is stable from prior exam and considered benign. No acute osseous abnormalities. CT ABDOMEN PELVIS FINDINGS Hepatobiliary: Hepatic steatosis. Similar cysts in the liver from prior exam. Gallbladder physiologically distended, no calcified stone. No biliary dilatation. Pancreas: No ductal dilatation or inflammation. Spleen: Normal in size without focal abnormality. Adrenals/Urinary Tract: Normal adrenal glands. No hydronephrosis. Homogeneous renal enhancement with symmetric excretion on delayed phase imaging. Mild symmetric perinephric edema also seen on prior exam. Urinary bladder is physiologically distended without wall thickening. Stomach/Bowel: Postsurgical change of the gastroesophageal junction with Nissen fundoplication. There is no evidence of extraluminal air. Fluid distends the stomach. There is no small bowel obstruction or inflammation. Mild colonic diverticulosis without diverticulitis. Normal appendix. Vascular/Lymphatic: Aortic atherosclerosis. No aortic aneurysm. Multiple small central mesenteric lymph nodes which are similar to prior exam. No bulky abdominopelvic adenopathy.  Reproductive: Enlarged prostate gland spans 5.5 cm transverse. Other: Trace free fluid in the dependent pelvis. No free air. No abscess. Postsurgical change of the anterior abdominal wall with suspected prior hernia repair. Small fat containing umbilical hernia. Fat in both inguinal canals. Musculoskeletal: Posterior rod with intrapedicular screw fusion L3-L4 with interbody spacers. No acute osseous abnormalities are seen. IMPRESSION: 1. No evidence  of esophageal or bowel perforation. 2. Postsurgical change of the gastroesophageal junction with Nissen fundoplication. No evidence of extraluminal air or free fluid. Mild wall thickening of the distal esophagus is nonspecific. 3. Patchy, nodular and ground-glass airspace disease throughout the left lung, greatest in the left lower lobe. Findings are suspicious for pneumonia, including aspiration given recent endoscopy. 4. Scattered pulmonary nodules throughout the right lung, not significantly changed from prior exam. 5. Hepatic steatosis. Colonic diverticulosis without diverticulitis. 6. Enlarged prostate gland. Aortic Atherosclerosis (ICD10-I70.0). Electronically Signed   By: Keith Rake M.D.   On: 08/14/2021 19:53   DG Abdomen Acute W/Chest  Result Date: 08/14/2021 CLINICAL DATA:  Shortness of breath and abdominal pain after endoscopy. EXAM: DG ABDOMEN ACUTE WITH 1 VIEW CHEST COMPARISON:  Chest x-ray dated February 26, 2021. Abdominal x-ray dated February 25, 2021. FINDINGS: There is no evidence of dilated bowel loops or free intraperitoneal air. No radiopaque calculi or other significant radiographic abnormality is seen. Heart size and mediastinal contours are within normal limits. Patchy consolidation in the left lower lobe. No pneumothorax or pleural effusion. IMPRESSION: 1. Negative abdominal radiographs. 2. Left lower lobe pneumonia. Electronically Signed   By: Titus Dubin M.D.   On: 08/14/2021 19:13        Scheduled Meds:  atorvastatin  80 mg Oral  QPM   fenofibrate  160 mg Oral Daily   insulin aspart  0-9 Units Subcutaneous TID WC   mometasone-formoterol  2 puff Inhalation BID   pantoprazole  40 mg Oral Daily   rivaroxaban  20 mg Oral Q supper   Continuous Infusions:  ceFEPime (MAXIPIME) IV Stopped (08/15/21 [id number])   metronidazole 500 mg (08/15/21 1444)     LOS: 1 day       Hosie Poisson, MD [facility]   To contact the attending provider between 7A-7P or the covering provider during after hours 7P-7A, please log into the web site [email or web address] and access using universal [place] password for that web site. If you do not have the password, please call the hospital operator.  08/15/2021, 4:17 PM

## 2021-08-15 NOTE — Progress Notes (Signed)
     [facility] Progress Note  Social visit.   Admitted with pneumonia/sepsis, possibly related to unwitnessed aspiration, although it sounds like symptoms may have preceded endoscopy.   [place] [name] reports that his fever has defervesced. Remains short of breath. Symptoms similar to when he has had pnuemonia in the past.   He remains appreciative for his care.   Thanks to the hospitalist team for your excellent care. We are available for any issues that develop during this hospitalization.   [name]  08/15/2021, 3:37 PM

## 2021-08-15 NOTE — Plan of Care (Signed)
°  Problem: Respiratory: °Goal: Ability to maintain adequate ventilation will improve °Outcome: Progressing °  °

## 2021-08-15 NOTE — ED Notes (Signed)
Attempted report 

## 2021-08-15 NOTE — ED Notes (Addendum)
Pt ambulated to the BR with assistance

## 2021-08-15 NOTE — Progress Notes (Signed)
Order has been entered for BE.  Sent to the schedulers to set up.

## 2021-08-15 NOTE — Progress Notes (Signed)
No bowel perforation of other GI findings on CT scan last night done for fever and low back pain following EGD / Colonoscopy.   Admitted with sepsis.  CXR showed LLQ PNA ( probably aspirated) On Maxipime. Lactic acid improved. Afebrile today. [place] [name] is still having some SOB but sats in upper 90's on 02 per [place]. Overall he feels okay .

## 2021-08-16 ENCOUNTER — Inpatient Hospital Stay (HOSPITAL_COMMUNITY): Payer: Medicare Other

## 2021-08-16 ENCOUNTER — Telehealth: Payer: Self-pay

## 2021-08-16 ENCOUNTER — Other Ambulatory Visit: Payer: Self-pay

## 2021-08-16 DIAGNOSIS — R0609 Other forms of dyspnea: Secondary | ICD-10-CM | POA: Diagnosis not present

## 2021-08-16 LAB — BASIC METABOLIC PANEL
Anion gap: 7 (ref 5–15)
BUN: 13 mg/dL (ref 8–23)
CO2: 19 mmol/L — ABNORMAL LOW (ref 22–32)
Calcium: 8.8 mg/dL — ABNORMAL LOW (ref 8.9–10.3)
Chloride: 111 mmol/L (ref 98–111)
Creatinine, Ser: 1.11 mg/dL (ref 0.61–1.24)
GFR, Estimated: >60 mL/min (ref >60)
Glucose, Bld: 217 mg/dL — ABNORMAL HIGH (ref 70–99)
Potassium: 3.5 mmol/L (ref 3.5–5.1)
Sodium: 137 mmol/L (ref 135–145)

## 2021-08-16 LAB — CBC WITH DIFFERENTIAL/PLATELET
Abs Immature Granulocytes: 0.04 10*3/uL (ref 0.00–0.07)
Basophils Absolute: 0 10*3/uL (ref 0.0–0.1)
Basophils Relative: 0 %
Eosinophils Absolute: 0.1 10*3/uL (ref 0.0–0.5)
Eosinophils Relative: 1 %
HCT: 34.6 % — ABNORMAL LOW (ref 39.0–52.0)
Hemoglobin: 11.1 g/dL — ABNORMAL LOW (ref 13.0–17.0)
Immature Granulocytes: 0 %
Lymphocytes Relative: 8 %
Lymphs Abs: 1 10*3/uL (ref 0.7–4.0)
MCH: 28.6 pg (ref 26.0–34.0)
MCHC: 32.1 g/dL (ref 30.0–36.0)
MCV: 89.2 fL (ref 80.0–100.0)
Monocytes Absolute: 0.7 10*3/uL (ref 0.1–1.0)
Monocytes Relative: 5 %
Neutro Abs: 10.5 10*3/uL — ABNORMAL HIGH (ref 1.7–7.7)
Neutrophils Relative %: 86 %
Platelets: 224 10*3/uL (ref 150–400)
RBC: 3.88 MIL/uL — ABNORMAL LOW (ref 4.22–5.81)
RDW: 16.8 % — ABNORMAL HIGH (ref 11.5–15.5)
WBC: 12.4 10*3/uL — ABNORMAL HIGH (ref 4.0–10.5)
nRBC: 0 % (ref 0.0–0.2)

## 2021-08-16 LAB — ECHOCARDIOGRAM COMPLETE
AR max vel: 2.44 cm2
AV Area VTI: 2.47 cm2
AV Area mean vel: 2.35 cm2
AV Mean grad: 8 mmHg
AV Peak grad: 15.2 mmHg
Ao pk vel: 1.95 m/s
Area-P 1/2: 4.39 cm2
Height: 69 in
S' Lateral: 2.9 cm
Weight: 3654.34 oz

## 2021-08-16 LAB — GLUCOSE, CAPILLARY
Glucose-Capillary: 176 mg/dL — ABNORMAL HIGH (ref 70–99)
Glucose-Capillary: 192 mg/dL — ABNORMAL HIGH (ref 70–99)
Glucose-Capillary: 188 mg/dL — ABNORMAL HIGH (ref 70–99)

## 2021-08-16 LAB — TROPONIN I (HIGH SENSITIVITY)
Troponin I (High Sensitivity): 97 ng/L — ABNORMAL HIGH (ref <18)
Troponin I (High Sensitivity): 78 ng/L — ABNORMAL HIGH (ref <18)

## 2021-08-16 LAB — LACTIC ACID, PLASMA: Lactic Acid, Venous: 2.2 mmol/L (ref 0.5–1.9)

## 2021-08-16 MED ORDER — IPRATROPIUM-ALBUTEROL 0.5-2.5 (3) MG/3ML IN SOLN
3.0000 mL | Freq: Four times a day (QID) | RESPIRATORY_TRACT | Status: DC | PRN
  Administered 2021-08-16 – 2021-08-19 (×2): 3 mL via RESPIRATORY_TRACT
  Filled 2021-08-16 (×2): qty 3

## 2021-08-16 MED ORDER — SODIUM CHLORIDE 0.9 % IV SOLN
2.0000 g | Freq: Three times a day (TID) | INTRAVENOUS | Status: DC
  Administered 2021-08-16 – 2021-08-20 (×11): 2 g via INTRAVENOUS
  Filled 2021-08-16 (×11): qty 2

## 2021-08-16 MED ORDER — IPRATROPIUM-ALBUTEROL 0.5-2.5 (3) MG/3ML IN SOLN
3.0000 mL | RESPIRATORY_TRACT | Status: AC
  Administered 2021-08-16: 3 mL via RESPIRATORY_TRACT

## 2021-08-16 MED ORDER — FUROSEMIDE 10 MG/ML IJ SOLN
20.0000 mg | Freq: Once | INTRAMUSCULAR | Status: AC
  Administered 2021-08-16: 20 mg via INTRAVENOUS
  Filled 2021-08-16: qty 2

## 2021-08-16 MED ORDER — MORPHINE SULFATE (PF) 2 MG/ML IV SOLN
1.0000 mg | INTRAVENOUS | Status: DC | PRN
  Administered 2021-08-16 – 2021-08-17 (×3): 2 mg via INTRAVENOUS
  Filled 2021-08-16 (×3): qty 1

## 2021-08-16 NOTE — Progress Notes (Signed)
PROGRESS NOTE    Garrett [name]  [id number] DOB: 05/14/1951 DOA: 08/14/2021 PCP: Ann Held, DO    Chief Complaint  Patient presents with   Shortness of Breath        Abdominal Pain   Post-op Problem    Brief Narrative:   Garrett [name] is a 70 y.o. male with medical history significant for chronic diastolic heart failure, type 2 diabetes mellitus, multiple prior unprovoked pulmonary emboli chronically anticoagulated on Xarelto, hyperlipidemia, hypertension, who is admitted to [facility] on 08/14/2021 with severe sepsis due to left-sided pneumonia after presenting from home to [facility] ED complaining of fever.  He underwent outpatient colonoscopy and EGD earlier today via Dr. [name] of [facility], with EGD indication for esophageal dilation in the setting of a history of esophageal stricture, while colonoscopy was performed reportedly for repeat routine screening purposes, with chart review revealing documentation of most recent previous colonoscopy in 2010.  No reported intraoperative complications, per chart review.  Patient was subsequently discharged to home from same-day surgery, however in the setting of progression of his subjective fever, chills, which he states started prior to undergoing colonoscopy/EGD today, he presented to [facility] emergency department for further evaluation . Pt seen and examined this am, reports having an episode of sob. Duonebs ordered.   Assessment & Plan:   Principal Problem:   Severe sepsis ([place]) Active Problems:   Controlled type 2 diabetes mellitus with diabetic nephropathy (HCC)   Essential hypertension   GERD (gastroesophageal reflux disease)   Hyperlipidemia associated with type 2 diabetes mellitus (HCC)   Acute respiratory failure with hypoxia (HCC)   Pneumonia   Lactic acidosis   Hypokalemia   Severe sepsis secondary to left-sided pneumonia/ MULTI focal pneumonia.  Started the patient on broad-spectrum IV  antibiotics and transition to IV cefepime and Flagyl. MRSA PCR is negative, can discontinue IV vancomycin. CT chest abdomen pelvis does not show any evidence of esophageal or bowel perforation.  Repeat lactic acid have improved. Continue with incentive spirometry and flutter valve.added duo nebs .  Follow-up strep pneumonia and urine Legionella antigen Plan to wean him off the oxygen in the next 24 to 48 hours.    Acute respiratory failure with hypoxia secondary to pneumonia. Continue with IV antibiotics and wean him off the oxygen as appropriate.    Chest pressure earlier this am.  Slightly elevated troponin's , pt reports the chest pressure improved after duoneb treatment.  Echocardiogram unremarkable.  EKG shows incomplete rbbb.     History of chronic diastolic heart failure Last echocardiogram in January 2020 showed preserved left ventricular ejection fraction with grade 1 diastolic dysfunction. Continue strict intake and output and daily weights.   Non-insulin-dependent type 2 diabetes mellitus Continue with sliding scale insulin inpatient. CBG (last 3)  Recent Labs    08/16/21 0648 08/16/21 1150 08/16/21 1629  GLUCAP 176* 192* 188*      Hypertension Blood pressure parameters appear to be optimal at this time.     History of unprovoked acute pulmonary embolism Continue with Xarelto.   GERD Continue with PPI.  Hypokalemia Replaced  Hyperlipidemia Continue with statin   Elevated troponins probably from demand ischemia from sepsis.   DVT prophylaxis: Xarelto Code Status: (Full code) Family Communication: family at bedside.  Disposition:   Status is: Inpatient  Remains inpatient appropriate because:Unsafe d/c plan and IV treatments appropriate due to intensity of illness or inability to take PO  Dispo: The patient is from:  Home              Anticipated d/c is to: Home              Patient currently is not medically stable to d/c.   Difficult to  place patient No       Consultants:  None  Procedures: none.   Antimicrobials:  Antibiotics Given (last 72 hours)     Date/Time Action Medication Dose Rate   08/14/21 1750 New Bag/Given   ceFEPIme (MAXIPIME) 2 g in sodium chloride 0.9 % 100 mL IVPB 2 g 200 mL/hr   08/14/21 1822 New Bag/Given   metroNIDAZOLE (FLAGYL) IVPB 500 mg 500 mg 100 mL/hr   08/14/21 1956 New Bag/Given   vancomycin (VANCOREADY) IVPB 2000 mg/400 mL 2,000 mg 200 mL/hr   08/14/21 2338 New Bag/Given   metroNIDAZOLE (FLAGYL) IVPB 500 mg 500 mg 100 mL/hr   08/15/21 0522 New Bag/Given   ceFEPIme (MAXIPIME) 2 g in sodium chloride 0.9 % 100 mL IVPB 2 g 200 mL/hr   08/15/21 [id number] New Bag/Given   metroNIDAZOLE (FLAGYL) IVPB 500 mg 500 mg 100 mL/hr   08/15/21 1444 New Bag/Given   metroNIDAZOLE (FLAGYL) IVPB 500 mg 500 mg 100 mL/hr   08/15/21 1823 New Bag/Given   ceFEPIme (MAXIPIME) 2 g in sodium chloride 0.9 % 100 mL IVPB 2 g 200 mL/hr   08/15/21 2300 New Bag/Given   metroNIDAZOLE (FLAGYL) IVPB 500 mg 500 mg 100 mL/hr   08/16/21 0600 New Bag/Given   ceFEPIme (MAXIPIME) 2 g in sodium chloride 0.9 % 100 mL IVPB 2 g 200 mL/hr   08/16/21 0755 New Bag/Given   metroNIDAZOLE (FLAGYL) IVPB 500 mg 500 mg 100 mL/hr   08/16/21 1442 New Bag/Given   ceFEPIme (MAXIPIME) 2 g in sodium chloride 0.9 % 100 mL IVPB 2 g 200 mL/hr   08/16/21 1605 New Bag/Given   metroNIDAZOLE (FLAGYL) IVPB 500 mg 500 mg 100 mL/hr         Subjective: Chest pressure improved with duoneb treatment.   Objective: Vitals:   08/15/21 2016 08/15/21 2025 08/16/21 0600 08/16/21 0934  BP:  125/62 (!) 148/74 134/66  Pulse:  75 75 74  Resp:  18 20 (!) 21  Temp:  98.9 F (37.2 C) 98.9 F (37.2 C) 98.4 F (36.9 C)  TempSrc:  Oral  Oral  SpO2: 98% 97% 96% 100%  Weight:      Height:        Intake/Output Summary (Last 24 hours) at 08/16/2021 1749 Last data filed at 08/16/2021 1300 Gross per 24 hour  Intake 939 ml  Output 1700 ml  Net -761 ml     Filed Weights   08/15/21 1122  Weight: 103.6 kg    Examination:  General exam: well developed gentleman, in mild distress.  Respiratory system: rhonchi on the left base, no wheezing heard, on 3l it of [place] oxygen.  Cardiovascular system: S1 & S2 heard,RRR no JVD, no pedal edema.  Gastrointestinal system: Abdomen is soft, non tender non distended, bowel sounds wnl.  Central nervous system: Alert and oriented, non focal.  Extremities: no pedal edema.  Skin: No rashes seen.  Psychiatry:  Mood  is appropriate.     Data Reviewed: I have personally reviewed following labs and imaging studies  CBC: Recent Labs  Lab 08/14/21 1651 08/15/21 0350 08/15/21 0511 08/16/21 0828  WBC 11.0* 15.5*  --  12.4*  NEUTROABS 10.0* 13.6*  --  10.5*  HGB 12.7* 11.0*  10.5* 11.1*  HCT 39.5 35.6* 31.0* 34.6*  MCV 88.8 91.5  --  89.2  PLT 236 219  --  224     Basic Metabolic Panel: Recent Labs  Lab 08/14/21 1651 08/15/21 0350 08/15/21 0511 08/15/21 1152 08/16/21 0828  NA 136 140 143  --  137  K 3.0* 3.7 3.2*  --  3.5  CL 108 111  --   --  111  CO2 18* 21*  --   --  19*  GLUCOSE 229* 178*  --   --  217*  BUN 14 12  --   --  13  CREATININE 1.34* 1.31*  --   --  1.11  CALCIUM 8.7* 8.9  --   --  8.8*  MG  --  1.8  --  1.8  --   PHOS  --  2.7  --   --   --      GFR: Estimated Creatinine Clearance: 72.4 mL/min (by C-G formula based on SCr of 1.11 mg/dL).  Liver Function Tests: Recent Labs  Lab 08/14/21 1651 08/15/21 0350  AST 51* 28  ALT 52* 39  ALKPHOS 44 39  BILITOT 0.7 0.8  PROT 6.2* 5.5*  ALBUMIN 3.3* 2.9*     CBG: Recent Labs  Lab 08/15/21 1637 08/15/21 2024 08/16/21 0648 08/16/21 1150 08/16/21 1629  GLUCAP 186* 166* 176* 192* 188*      Recent Results (from the past 240 hour(s))  Blood Culture (routine x 2)     Status: None (Preliminary result)   Collection Time: 08/14/21  5:15 PM   Specimen: BLOOD RIGHT WRIST  Result Value Ref Range Status   Specimen  Description BLOOD RIGHT WRIST  Final   Special Requests   Final    BOTTLES DRAWN AEROBIC AND ANAEROBIC Blood Culture adequate volume   Culture   Final    NO GROWTH 2 DAYS Performed at [facility], [place] [address] [place], [place] [address]    Report Status PENDING  Incomplete  Blood Culture (routine x 2)     Status: None (Preliminary result)   Collection Time: 08/14/21  5:25 PM   Specimen: BLOOD  Result Value Ref Range Status   Specimen Description BLOOD SITE NOT SPECIFIED  Final   Special Requests   Final    BOTTLES DRAWN AEROBIC AND ANAEROBIC Blood Culture adequate volume   Culture   Final    NO GROWTH 2 DAYS Performed at [facility], [address], [place], [place] [address]    Report Status PENDING  Incomplete  Resp Panel by RT-PCR (Flu A&B, Covid) Nasopharyngeal Swab     Status: None   Collection Time: 08/14/21  5:54 PM   Specimen: Nasopharyngeal Swab; Nasopharyngeal(NP) swabs in vial transport medium  Result Value Ref Range Status   SARS Coronavirus 2 by RT PCR NEGATIVE NEGATIVE Final    Comment: (NOTE) SARS-CoV-2 target nucleic acids are NOT DETECTED.  The SARS-CoV-2 RNA is generally detectable in upper respiratory specimens during the acute phase of infection. The lowest concentration of SARS-CoV-2 viral copies this assay can detect is 138 copies/mL. A negative result does not preclude SARS-Cov-2 infection and should not be used as the sole basis for treatment or other patient management decisions. A negative result may occur with  improper specimen collection/handling, submission of specimen other than nasopharyngeal swab, presence of viral mutation(s) within the areas targeted by this assay, and inadequate number of viral copies(<138 copies/mL). A negative result must  be combined with clinical observations, patient history, and epidemiological information. The expected result is Negative.  Fact Sheet for Patients:   EntrepreneurPulse.com.au  Fact Sheet for Healthcare Providers:  IncredibleEmployment.be  This test is no t yet approved or cleared by the [place] FDA and  has been authorized for detection and/or diagnosis of SARS-CoV-2 by FDA under an Emergency Use Authorization (EUA). This EUA will remain  in effect (meaning this test can be used) for the duration of the COVID-19 declaration under Section 564(b)(1) of the Act, 21 U.S.C.section 360bbb-3(b)(1), unless the authorization is terminated  or revoked sooner.       Influenza A by PCR NEGATIVE NEGATIVE Final   Influenza B by PCR NEGATIVE NEGATIVE Final    Comment: (NOTE) The Xpert Xpress SARS-CoV-2/FLU/RSV plus assay is intended as an aid in the diagnosis of influenza from Nasopharyngeal swab specimens and should not be used as a sole basis for treatment. Nasal washings and aspirates are unacceptable for Xpert Xpress SARS-CoV-2/FLU/RSV testing.  Fact Sheet for Patients: EntrepreneurPulse.com.au  Fact Sheet for Healthcare Providers: IncredibleEmployment.be  This test is not yet approved or cleared by the [place] FDA and has been authorized for detection and/or diagnosis of SARS-CoV-2 by FDA under an Emergency Use Authorization (EUA). This EUA will remain in effect (meaning this test can be used) for the duration of the COVID-19 declaration under Section 564(b)(1) of the Act, 21 U.S.C. section 360bbb-3(b)(1), unless the authorization is terminated or revoked.  Performed at [facility], [place] [address] [place], [place] [address]   MRSA Next Gen by PCR, Nasal     Status: None   Collection Time: 08/15/21  3:23 AM   Specimen: Nasal Mucosa; Nasal Swab  Result Value Ref Range Status   MRSA by PCR Next Gen NOT DETECTED NOT DETECTED Final    Comment: (NOTE) The GeneXpert MRSA Assay (FDA approved for NASAL specimens only), is one component of a  comprehensive MRSA colonization surveillance program. It is not intended to diagnose MRSA infection nor to guide or monitor treatment for MRSA infections. Test performance is not FDA approved in patients less than 71 years old. Performed at [facility], [place] [address], [place], [place] [address]           Radiology Studies: CT CHEST ABDOMEN PELVIS W CONTRAST  Result Date: 08/14/2021 CLINICAL DATA:  Esophageal perforation. History provided on radiograph earlier today states: Colonoscopy and endoscopy earlier today. EXAM: CT CHEST, ABDOMEN, AND PELVIS WITH CONTRAST TECHNIQUE: Multidetector CT imaging of the chest, abdomen and pelvis was performed following the standard protocol during bolus administration of intravenous contrast. CONTRAST:  154m OMNIPAQUE IOHEXOL 300 MG/ML  SOLN COMPARISON:  Radiographs earlier today, chest CT 09/29/2020, abdomen pelvis CT 07/31/2020 FINDINGS: CT CHEST FINDINGS Cardiovascular: Normal caliber thoracic aorta. Normal heart size. No pericardial effusion. There are coronary artery calcifications. Mediastinum/Nodes: There is no pneumomediastinum. No paraesophageal air. Minimal wall thickening of the distal esophagus. There is no paraesophageal fluid. Scattered mediastinal lymph nodes are not enlarged by size criteria. No thyroid nodule. Lungs/Pleura: No pneumothorax. Patchy, nodular and ground-glass airspace disease throughout the left lung, greatest in the left lower lobe. Minimal patchy opacities in the right lower lobe. There is a bandlike opacity in the perifissural right lower and middle lobe. No pleural fluid. Scattered pulmonary nodules throughout the right lung, not significantly changed from prior exam, majority are pleural based. No debris within the tracheobronchial tree. Musculoskeletal: Thoracic spondylosis with multilevel endplate spurring. Sclerotic  focus in the right anterior third rib, unchanged. Lucency within the posterior left second rib is  unchanged. Lucent lesion within the right scapula is stable from prior exam and considered benign. No acute osseous abnormalities. CT ABDOMEN PELVIS FINDINGS Hepatobiliary: Hepatic steatosis. Similar cysts in the liver from prior exam. Gallbladder physiologically distended, no calcified stone. No biliary dilatation. Pancreas: No ductal dilatation or inflammation. Spleen: Normal in size without focal abnormality. Adrenals/Urinary Tract: Normal adrenal glands. No hydronephrosis. Homogeneous renal enhancement with symmetric excretion on delayed phase imaging. Mild symmetric perinephric edema also seen on prior exam. Urinary bladder is physiologically distended without wall thickening. Stomach/Bowel: Postsurgical change of the gastroesophageal junction with Nissen fundoplication. There is no evidence of extraluminal air. Fluid distends the stomach. There is no small bowel obstruction or inflammation. Mild colonic diverticulosis without diverticulitis. Normal appendix. Vascular/Lymphatic: Aortic atherosclerosis. No aortic aneurysm. Multiple small central mesenteric lymph nodes which are similar to prior exam. No bulky abdominopelvic adenopathy. Reproductive: Enlarged prostate gland spans 5.5 cm transverse. Other: Trace free fluid in the dependent pelvis. No free air. No abscess. Postsurgical change of the anterior abdominal wall with suspected prior hernia repair. Small fat containing umbilical hernia. Fat in both inguinal canals. Musculoskeletal: Posterior rod with intrapedicular screw fusion L3-L4 with interbody spacers. No acute osseous abnormalities are seen. IMPRESSION: 1. No evidence of esophageal or bowel perforation. 2. Postsurgical change of the gastroesophageal junction with Nissen fundoplication. No evidence of extraluminal air or free fluid. Mild wall thickening of the distal esophagus is nonspecific. 3. Patchy, nodular and ground-glass airspace disease throughout the left lung, greatest in the left lower  lobe. Findings are suspicious for pneumonia, including aspiration given recent endoscopy. 4. Scattered pulmonary nodules throughout the right lung, not significantly changed from prior exam. 5. Hepatic steatosis. Colonic diverticulosis without diverticulitis. 6. Enlarged prostate gland. Aortic Atherosclerosis (ICD10-I70.0). Electronically Signed   By: Keith Rake M.D.   On: 08/14/2021 19:53   DG CHEST PORT 1 VIEW  Result Date: 08/16/2021 CLINICAL DATA:  Encounter for shortness of breath. EXAM: PORTABLE CHEST 1 VIEW COMPARISON:  02/26/2021 FINDINGS: Heart size normal. Lung volumes are low. Slight asymmetric elevation of right hemidiaphragm is unchanged. Persistent airspace disease within the left upper lobe and left lower lobe. Subsegmental atelectasis within the right midlung is unchanged. IMPRESSION: Persistent airspace disease within the left upper lobe and left lower lobe compatible with multifocal infection. Electronically Signed   By: Kerby Moors M.D.   On: 08/16/2021 11:41   DG Abdomen Acute W/Chest  Result Date: 08/14/2021 CLINICAL DATA:  Shortness of breath and abdominal pain after endoscopy. EXAM: DG ABDOMEN ACUTE WITH 1 VIEW CHEST COMPARISON:  Chest x-ray dated February 26, 2021. Abdominal x-ray dated February 25, 2021. FINDINGS: There is no evidence of dilated bowel loops or free intraperitoneal air. No radiopaque calculi or other significant radiographic abnormality is seen. Heart size and mediastinal contours are within normal limits. Patchy consolidation in the left lower lobe. No pneumothorax or pleural effusion. IMPRESSION: 1. Negative abdominal radiographs. 2. Left lower lobe pneumonia. Electronically Signed   By: Titus Dubin M.D.   On: 08/14/2021 19:13   ECHOCARDIOGRAM COMPLETE  Result Date: 08/16/2021    ECHOCARDIOGRAM REPORT   Patient Name:   Garrett [name] Date of Exam: 08/16/2021 Medical Rec #:  [id number]      Height:       69.0 in Accession #:    [id number]     Weight:        228.4 lb  Date of Birth:  [birth year]      BSA:          2.186 m Patient Age:    59 years       BP:           134/66 mmHg Patient Gender: M              HR:           81 bpm. Exam Location:  Inpatient Procedure: 2D Echo, Color Doppler and Cardiac Doppler Indications:    Dyspnea  History:        Patient has prior history of Echocardiogram examinations, most                 recent 03/03/2017. Pneumonia.  Sonographer:    Merrie Roof RDCS Referring Phys: [place]  1. Left ventricular ejection fraction, by estimation, is 60 to 65%. The left ventricle has normal function. The left ventricle has no regional wall motion abnormalities. Left ventricular diastolic parameters were normal.  2. Right ventricular systolic function is normal. The right ventricular size is normal.  3. The mitral valve is normal in structure. No evidence of mitral valve regurgitation. No evidence of mitral stenosis.  4. The aortic valve is tricuspid. Aortic valve regurgitation is trivial. Mild aortic valve sclerosis is present, with no evidence of aortic valve stenosis.  5. The inferior vena cava is normal in size with greater than 50% respiratory variability, suggesting right atrial pressure of 3 mmHg. FINDINGS  Left Ventricle: Left ventricular ejection fraction, by estimation, is 60 to 65%. The left ventricle has normal function. The left ventricle has no regional wall motion abnormalities. The left ventricular internal cavity size was normal in size. There is  no left ventricular hypertrophy. Left ventricular diastolic parameters were normal. Right Ventricle: The right ventricular size is normal. Right ventricular systolic function is normal. Left Atrium: Left atrial size was normal in size. Right Atrium: Right atrial size was normal in size. Pericardium: There is no evidence of pericardial effusion. Mitral Valve: The mitral valve is normal in structure. No evidence of mitral valve regurgitation. No evidence of mitral valve  stenosis. Tricuspid Valve: The tricuspid valve is normal in structure. Tricuspid valve regurgitation is mild . No evidence of tricuspid stenosis. Aortic Valve: The aortic valve is tricuspid. Aortic valve regurgitation is trivial. Mild aortic valve sclerosis is present, with no evidence of aortic valve stenosis. Aortic valve mean gradient measures 8.0 mmHg. Aortic valve peak gradient measures 15.2 mmHg. Aortic valve area, by VTI measures 2.47 cm. Pulmonic Valve: The pulmonic valve was grossly normal. Pulmonic valve regurgitation is not visualized. No evidence of pulmonic stenosis. Aorta: The aortic root is normal in size and structure. Venous: The inferior vena cava is normal in size with greater than 50% respiratory variability, suggesting right atrial pressure of 3 mmHg. IAS/Shunts: The interatrial septum was not well visualized.  LEFT VENTRICLE PLAX 2D LVIDd:         4.50 cm  Diastology LVIDs:         2.90 cm  LV e' medial:    10.30 cm/s LV PW:         1.20 cm  LV E/e' medial:  10.9 LV IVS:        1.10 cm  LV e' lateral:   12.20 cm/s LVOT diam:     1.90 cm  LV E/e' lateral: 9.2 LV SV:         89 LV SV  Index:   41 LVOT Area:     2.84 cm  RIGHT VENTRICLE RV Basal diam:  3.70 cm LEFT ATRIUM             Index       RIGHT ATRIUM           Index LA diam:        4.40 cm 2.01 cm/m  RA Area:     23.40 cm LA Vol (A2C):   47.4 ml 21.68 ml/m RA Volume:   63.00 ml  28.82 ml/m LA Vol (A4C):   62.7 ml 28.68 ml/m LA Biplane Vol: 56.5 ml 25.85 ml/m  AORTIC VALVE AV Area (Vmax):    2.44 cm AV Area (Vmean):   2.35 cm AV Area (VTI):     2.47 cm AV Vmax:           195.00 cm/s AV Vmean:          128.000 cm/s AV VTI:            0.361 m AV Peak Grad:      15.2 mmHg AV Mean Grad:      8.0 mmHg LVOT Vmax:         168.00 cm/s LVOT Vmean:        106.000 cm/s LVOT VTI:          0.314 m LVOT/AV VTI ratio: 0.87  AORTA Ao Root diam: 3.10 cm Ao Asc diam:  2.90 cm MITRAL VALVE                TRICUSPID VALVE MV Area (PHT): 4.39 cm      TR Peak grad:   36.0 mmHg MV Decel Time: 173 msec     TR Vmax:        300.00 cm/s MV E velocity: 112.00 cm/s MV A velocity: 89.10 cm/s   SHUNTS MV E/A ratio:  1.26         Systemic VTI:  0.31 m                             Systemic Diam: 1.90 cm Kirk Ruths MD Electronically signed by Kirk Ruths MD Signature Date/Time: 08/16/2021/3:55:14 PM    Final         Scheduled Meds:  atorvastatin  80 mg Oral QPM   fenofibrate  160 mg Oral Daily   insulin aspart  0-9 Units Subcutaneous TID WC   mometasone-formoterol  2 puff Inhalation BID   pantoprazole  40 mg Oral Daily   rivaroxaban  20 mg Oral Q supper   Continuous Infusions:  ceFEPime (MAXIPIME) IV 2 g (08/16/21 1442)   metronidazole 500 mg (08/16/21 1605)     LOS: 2 days       Hosie Poisson, MD [facility]   To contact the attending provider between 7A-7P or the covering provider during after hours 7P-7A, please log into the web site [email or web address] and access using universal [place] password for that web site. If you do not have the password, please call the hospital operator.  08/16/2021, 5:49 PM

## 2021-08-16 NOTE — TOC Initial Note (Signed)
Transition of Care [facility]) - Initial/Assessment Note    Patient Details  Name: Garrett [name] MRN: [id number] Date of Birth: 05/19/1951  Transition of [facility]) CM/SW Contact:    Tom-Johnson, Renea Ee, RN Phone Number: 08/16/2021, 5:18 PM  Clinical Narrative:                 CM consulted for TOC needs. Spoke with patient and wife at bedside. Patient lives with wife at home. Drives self to and from appointment. Has a walker and cane at home. CM notified patient of PT's recommendation for home health PT. List of agencies from Medicare.gov given to patient and wife. [facility] health chosen. CM spoke with Tommi Rumps at [place] [phone]) and agreed to render services to patient. Information placed on AVS. TOC will continue to follow.   Expected Discharge Plan: [place] Barriers to Discharge: Continued Medical Work up   Patient Goals and CMS Choice Patient states their goals for this hospitalization and ongoing recovery are:: To go home CMS Medicare.gov Compare Post Acute Care list provided to:: Patient Choice offered to / list presented to : Patient, Spouse  Expected Discharge Plan and Services Expected Discharge Plan: [place]   Discharge Planning Services: CM Consult Post Acute Care Choice: [place] arrangements for the past 2 months: [place]: PT [place]: [place] Date [place]: 08/16/21 Time HH Agency Contacted: [id number] Representative spoke with at [place]: Tommi Rumps  Prior Living Arrangements/Services Living arrangements for the past 2 months: [place] Lives with:: Spouse Patient language and need for interpreter reviewed:: Yes Do you feel safe going back to the place where you live?: Yes      Need for Family Participation in Patient Care: Yes (Comment) Care giver support system in place?: Yes (comment) Current home services: DME  (Walker, cane) Criminal Activity/Legal Involvement Pertinent to Current Situation/Hospitalization: No - Comment as needed  Activities of Daily Living Home Assistive Devices/Equipment: Cane (specify quad or straight), Hearing aid, Shower chair without back ADL Screening (condition at time of admission) Patient's cognitive ability adequate to safely complete daily activities?: Yes Is the patient deaf or have difficulty hearing?: Yes Does the patient have difficulty seeing, even when wearing glasses/contacts?: No Does the patient have difficulty concentrating, remembering, or making decisions?: No Patient able to express need for assistance with ADLs?: Yes Does the patient have difficulty dressing or bathing?: No Independently performs ADLs?: Yes (appropriate for developmental age) Does the patient have difficulty walking or climbing stairs?: No Weakness of Legs: Both Weakness of Arms/Hands: None  Permission Sought/Granted Permission sought to share information with : Case Manager, Customer service manager Permission granted to share information with : Yes, Verbal Permission Granted              Emotional Assessment Appearance:: Appears stated age Attitude/Demeanor/Rapport: Engaged Affect (typically observed): Accepting, Appropriate Orientation: : Oriented to Self, Oriented to Place, Oriented to  Time, Oriented to Situation Alcohol / Substance Use: Not Applicable Psych Involvement: No (comment)  Admission diagnosis:  Sepsis ([place]) [A41.9] Severe sepsis ([place]) [A41.9, R65.20] Patient Active Problem List   Diagnosis Date Noted   Severe sepsis ([place]) 08/14/2021   Pneumonia 08/14/2021   Lactic acidosis 08/14/2021   Hypokalemia 08/14/2021   DOE (dyspnea on exertion)  05/11/2021   Abnormal stress test 04/20/2021   Acute respiratory distress    Acute respiratory failure with hypoxia (HCC)    S/P Nissen fundoplication (without gastrostomy tube) procedure 02/20/2021   Body mass  index (BMI) 33.0-33.9, adult 11/03/2019   Laceration of right hand 08/02/2019   Acute pulmonary embolism ([place]) 01/07/2019   Chronic diastolic CHF (congestive heart failure) ([place]) 01/07/2019   Preventative health care 02/10/2018   Dizziness 02/10/2018   Spondylolisthesis at L3-L4 level 10/28/2017   Cough variant asthma  vs uacs/ pseudoasthma 06/17/2017   Pneumonia of right middle lobe due to infectious organism 02/28/2017   Pulmonary embolism and infarction ([place]) 02/09/2017   Bilateral pulmonary embolism ([place]) 02/04/2017   Greater trochanteric bursitis of left hip 01/14/2017   Greater trochanteric bursitis of right hip 12/20/2016   Degenerative arthritis of knee, bilateral 10/08/2016   Upper airway cough syndrome 03/22/2016   Multiple pulmonary nodules 03/22/2016   Acute bronchitis 01/22/2016   Headache disorder 01/04/2016   Acute upper respiratory infection 10/25/2015   Elevated CK 07/18/2015   History of colonic polyps 04/11/2015   Lumbar stenosis with neurogenic claudication 11/14/2014   Spondylolysis of cervical region 02/25/2014   Lumbosacral spondylosis without myelopathy 01/06/2014   OSA (obstructive sleep apnea) 12/08/2013   SOB (shortness of breath) on exertion 11/04/2013   Morbid obesity due to excess calories ([place])    Cervicalgia    Venous insufficiency of leg 02/18/2012   Itching 02/18/2012   Mild dementia ([place]) 05/28/2011   Hyperlipidemia associated with type 2 diabetes mellitus ([place]) 05/27/2011   Controlled type 2 diabetes mellitus with diabetic nephropathy ([place]) 04/10/2011   Gout 04/10/2011   Essential hypertension 04/10/2011   OA (osteoarthritis) 04/10/2011   GERD (gastroesophageal reflux disease) 04/10/2011   Migraine headache without aura 04/10/2011   Allergic rhinitis, cause unspecified 04/10/2011   Bee sting allergy 04/10/2011   PCP:  Ann Held, DO Pharmacy:   [facility], [place] - [address] [address] Phone: [phone] Fax: [phone]  [facility]  ([place], [place] [facility] [address] [place] [address] [place] [address] Phone: [phone] Fax: [phone]     Social Determinants of Health (SDOH) Interventions    Readmission Risk Interventions No flowsheet data found.

## 2021-08-16 NOTE — Evaluation (Signed)
Physical Therapy Evaluation Patient Details Name: Garrett [name] MRN: [id number] DOB: 1951/03/21 Today's Date: 08/16/2021   History of Present Illness  70 y.o. male presents to [facility] ED on 08/14/2021 after colonoscopy and upper endoscopy earlier in the day. Pt with fever in ED, found to be septic 2/2 L sided PNA. PMH includes chronic diastolic heart failure, type 2 diabetes mellitus, multiple prior unprovoked pulmonary emboli chronically anticoagulated on Xarelto, hyperlipidemia, hypertension.  Clinical Impression  Pt presents to PT with deficits in activity tolerance, power, strength, balance, and gait. Pt fatigues quickly with increased work of breathing and reported DOE with all activity. Pt benefits from BUE support when standing or ambulating to improve balance and reduce falls risk. Pt will benefit from gait assessment with use of 4 wheeled walker to aide in energy conservation. PT recommends HHPT at the time of discharge.    Follow Up Recommendations Home health PT;Supervision - Intermittent    Equipment Recommendations  None recommended by PT    Recommendations for Other Services       Precautions / Restrictions Precautions Precautions: Fall Restrictions Weight Bearing Restrictions: No      Mobility  Bed Mobility Overal bed mobility: Modified Independent             General bed mobility comments: increased time, HOB elevated    Transfers Overall transfer level: Needs assistance Equipment used: Straight cane;Rolling walker (2 wheeled) Transfers: Sit to/from Stand Sit to Stand: Min guard;Supervision         General transfer comment: minG with cane, supervision with RW  Ambulation/Gait Ambulation/Gait assistance: Min guard;Supervision Gait Distance (Feet): 120 Feet (additional trials of 5' with [facility] and 15' with RW) Assistive device: Rolling walker (2 wheeled);Straight cane Gait Pattern/deviations: Step-to pattern;Step-through pattern Gait velocity:  functional Gait velocity interpretation: 1.31 - 2.62 ft/sec, indicative of limited community ambulator General Gait Details: pt with slow guarded steps with use of SPC, reduced sway with use of RW as well as increased stride length  Stairs            Wheelchair Mobility    Modified Rankin (Stroke Patients Only)       Balance Overall balance assessment: Needs assistance Sitting-balance support: No upper extremity supported;Feet supported Sitting balance-Leahy Scale: Good     Standing balance support: Bilateral upper extremity supported Standing balance-Leahy Scale: Poor Standing balance comment: reliant on BUE support and supervision                             Pertinent Vitals/Pain Pain Assessment: No/denies pain    Home Living Family/patient expects to be discharged to:: Private residence Living Arrangements: Spouse/significant other Available Help at Discharge: Family;Available 24 hours/day Type of Home: House Home Access: Stairs to enter Entrance Stairs-Rails: Can reach both Entrance Stairs-Number of Steps: 5 Home Layout: One level Home Equipment: Walker - 2 wheels;Walker - 4 wheels;Cane - single point;Shower seat;Shower seat - built in;Grab bars - tub/shower (shower seat is too low for patient)      Prior Function Level of Independence: Independent with assistive device(s)         Comments: pt reports utilizing a SPC for ambulation to improve balance, reports some DOE at baseline when ambulating to workshop     Hand Dominance   Dominant Hand: Right    Extremity/Trunk Assessment   Upper Extremity Assessment Upper Extremity Assessment: Overall WFL for tasks assessed    Lower Extremity Assessment Lower Extremity Assessment:  Generalized weakness    Cervical / Trunk Assessment Cervical / Trunk Assessment: Normal  Communication   Communication: HOH  Cognition Arousal/Alertness: Awake/alert Behavior During Therapy: WFL for tasks  assessed/performed Overall Cognitive Status: Within Functional Limits for tasks assessed                                        General Comments General comments (skin integrity, edema, etc.): pt on 4L [place] upon arrival, PT able to wean to room air with stable sats at rest and sats from 92-96% when mobilizing. Pt does report 91/10 DOE with mobility    Exercises     Assessment/Plan    PT Assessment Patient needs continued PT services  PT Problem List Decreased activity tolerance;Decreased balance;Decreased strength;Cardiopulmonary status limiting activity       PT Treatment Interventions DME instruction;Gait training;Stair training;Functional mobility training;Therapeutic activities;Therapeutic exercise;Balance training;Patient/family education    PT Goals (Current goals can be found in the Care Plan section)  Acute Rehab PT Goals Patient Stated Goal: to improve activity tolerance PT Goal Formulation: With patient Time For Goal Achievement: 08/30/21 Potential to Achieve Goals: Good Additional Goals Additional Goal #1: Pt will report 3/10 DOE or less when ambulating for at least 100' to demonstrate improvements in activity tolerance    Frequency Min 3X/week   Barriers to discharge        Co-evaluation               AM-PAC PT "6 Clicks" Mobility  Outcome Measure Help needed turning from your back to your side while in a flat bed without using bedrails?: None Help needed moving from lying on your back to sitting on the side of a flat bed without using bedrails?: None Help needed moving to and from a bed to a chair (including a wheelchair)?: A Little Help needed standing up from a chair using your arms (e.g., wheelchair or bedside chair)?: A Little Help needed to walk in hospital room?: A Little Help needed climbing 3-5 steps with a railing? : A Lot 6 Click Score: 19    End of Session   Activity Tolerance: Patient limited by fatigue Patient left: in  chair;with call bell/phone within reach;with chair alarm set;with family/visitor present Nurse Communication: Mobility status PT Visit Diagnosis: Other abnormalities of gait and mobility (R26.89)    Time: [id number] PT Time Calculation (min) (ACUTE ONLY): 39 min   Charges:   PT Evaluation $PT Eval Low Complexity: 1 Low PT Treatments $Gait Training: 8-22 mins        Zenaida Niece, PT, DPT Acute Rehabilitation Pager: [phone]   Zenaida Niece 08/16/2021, 12:04 PM

## 2021-08-16 NOTE — Progress Notes (Signed)
  Echocardiogram 2D Echocardiogram has been performed.  Merrie Roof F 08/16/2021, 3:41 PM

## 2021-08-16 NOTE — Telephone Encounter (Signed)
  Follow up Call-  Call back number 08/14/2021 06/02/2020  Post procedure Call Back phone  # [phone]  Permission to leave phone message Yes Yes  Some recent data might be hidden     Patient questions:  Do you have a fever, pain , or abdominal swelling? No. Pain Score  0 *  Have you tolerated food without any problems? Yes.    Have you been able to return to your normal activities? No.  Do you have any questions about your discharge instructions: Diet   No. Medications  No. Follow up visit  No.  Do you have questions or concerns about your Care? No.  Actions: * If pain score is 4 or above: No action needed, pain <4.  Pt states he is in the hospital dx with pneumonia, states he is eating and drinking fine, just still SOB, states Dr Tarri Glenn came and saw him and Dr [name] and Nevin Bloodgood are aware.  Have you developed a fever since your procedure? no  2.   Have you had an respiratory symptoms (SOB or cough) since your procedure? no  3.   Have you tested positive for COVID 19 since your procedure no  4.   Have you had any family members/close contacts diagnosed with the COVID 19 since your procedure?  no   If yes to any of these questions please route to Joylene John, RN and Joella Prince, RN

## 2021-08-17 ENCOUNTER — Other Ambulatory Visit: Payer: Self-pay | Admitting: Family Medicine

## 2021-08-17 ENCOUNTER — Encounter: Payer: Self-pay | Admitting: Gastroenterology

## 2021-08-17 LAB — GLUCOSE, CAPILLARY
Glucose-Capillary: 166 mg/dL — ABNORMAL HIGH (ref 70–99)
Glucose-Capillary: 151 mg/dL — ABNORMAL HIGH (ref 70–99)
Glucose-Capillary: 150 mg/dL — ABNORMAL HIGH (ref 70–99)
Glucose-Capillary: 159 mg/dL — ABNORMAL HIGH (ref 70–99)

## 2021-08-17 LAB — CBC WITH DIFFERENTIAL/PLATELET
Abs Immature Granulocytes: 0.05 10*3/uL (ref 0.00–0.07)
Basophils Absolute: 0 10*3/uL (ref 0.0–0.1)
Basophils Relative: 0 %
Eosinophils Absolute: 0.1 10*3/uL (ref 0.0–0.5)
Eosinophils Relative: 1 %
HCT: 33.3 % — ABNORMAL LOW (ref 39.0–52.0)
Hemoglobin: 10.4 g/dL — ABNORMAL LOW (ref 13.0–17.0)
Immature Granulocytes: 1 %
Lymphocytes Relative: 9 %
Lymphs Abs: 0.9 10*3/uL (ref 0.7–4.0)
MCH: 27.6 pg (ref 26.0–34.0)
MCHC: 31.2 g/dL (ref 30.0–36.0)
MCV: 88.3 fL (ref 80.0–100.0)
Monocytes Absolute: 0.7 10*3/uL (ref 0.1–1.0)
Monocytes Relative: 7 %
Neutro Abs: 8.2 10*3/uL — ABNORMAL HIGH (ref 1.7–7.7)
Neutrophils Relative %: 82 %
Platelets: 229 10*3/uL (ref 150–400)
RBC: 3.77 MIL/uL — ABNORMAL LOW (ref 4.22–5.81)
RDW: 16.6 % — ABNORMAL HIGH (ref 11.5–15.5)
WBC: 10 10*3/uL (ref 4.0–10.5)
nRBC: 0 % (ref 0.0–0.2)

## 2021-08-17 LAB — BASIC METABOLIC PANEL
Anion gap: 6 (ref 5–15)
BUN: 11 mg/dL (ref 8–23)
CO2: 21 mmol/L — ABNORMAL LOW (ref 22–32)
Calcium: 8.8 mg/dL — ABNORMAL LOW (ref 8.9–10.3)
Chloride: 111 mmol/L (ref 98–111)
Creatinine, Ser: 1.04 mg/dL (ref 0.61–1.24)
GFR, Estimated: >60 mL/min (ref >60)
Glucose, Bld: 185 mg/dL — ABNORMAL HIGH (ref 70–99)
Potassium: 3.4 mmol/L — ABNORMAL LOW (ref 3.5–5.1)
Sodium: 138 mmol/L (ref 135–145)

## 2021-08-17 LAB — HEMOGLOBIN A1C
Hgb A1c MFr Bld: 7.5 % — ABNORMAL HIGH (ref 4.8–5.6)
Mean Plasma Glucose: 168.55 mg/dL

## 2021-08-17 MED ORDER — POTASSIUM CHLORIDE CRYS ER 20 MEQ PO TBCR
40.0000 meq | EXTENDED_RELEASE_TABLET | Freq: Once | ORAL | Status: AC
  Administered 2021-08-17: 40 meq via ORAL
  Filled 2021-08-17: qty 2

## 2021-08-17 MED ORDER — MEMANTINE HCL 10 MG PO TABS
20.0000 mg | ORAL_TABLET | Freq: Every day | ORAL | Status: DC
  Administered 2021-08-17 – 2021-08-20 (×4): 20 mg via ORAL
  Filled 2021-08-17 (×4): qty 2

## 2021-08-17 MED ORDER — FLUTICASONE PROPIONATE 50 MCG/ACT NA SUSP
2.0000 | Freq: Every day | NASAL | Status: DC
  Administered 2021-08-17 – 2021-08-20 (×4): 2 via NASAL
  Filled 2021-08-17: qty 16

## 2021-08-17 MED ORDER — DAPAGLIFLOZIN PROPANEDIOL 10 MG PO TABS
10.0000 mg | ORAL_TABLET | Freq: Every day | ORAL | Status: DC
  Administered 2021-08-18 – 2021-08-20 (×3): 10 mg via ORAL
  Filled 2021-08-17 (×3): qty 1

## 2021-08-17 MED ORDER — METOPROLOL TARTRATE 50 MG PO TABS
50.0000 mg | ORAL_TABLET | Freq: Two times a day (BID) | ORAL | Status: DC
  Administered 2021-08-17 – 2021-08-20 (×7): 50 mg via ORAL
  Filled 2021-08-17 (×7): qty 1

## 2021-08-17 MED ORDER — GABAPENTIN 100 MG PO CAPS
200.0000 mg | ORAL_CAPSULE | Freq: Every day | ORAL | Status: DC
  Administered 2021-08-17 – 2021-08-19 (×3): 200 mg via ORAL
  Filled 2021-08-17 (×3): qty 2

## 2021-08-17 MED ORDER — VERAPAMIL HCL ER 240 MG PO TBCR
240.0000 mg | EXTENDED_RELEASE_TABLET | Freq: Every day | ORAL | Status: DC
  Administered 2021-08-17 – 2021-08-19 (×3): 240 mg via ORAL
  Filled 2021-08-17 (×4): qty 1

## 2021-08-17 NOTE — Care Management Important Message (Signed)
Important Message  Patient Details  Name: Garrett [name] MRN: [id number] Date of Birth: [birth year]   Medicare Important Message Given:  Yes     Hanadi Stanly Montine Circle 08/17/2021, 4:32 PM

## 2021-08-17 NOTE — Discharge Instructions (Signed)
Information on my medicine - XARELTO (rivaroxaban)  WHY WAS XARELTO PRESCRIBED FOR YOU? Xarelto was prescribed to treat blood clots that may have been found in the veins of your legs (deep vein thrombosis) or in your lungs (pulmonary embolism) and to reduce the risk of them occurring again.  What do you need to know about Xarelto? The dose one 20 mg tablet taken ONCE A DAY with your evening meal.  DO NOT stop taking Xarelto without talking to the health care provider who prescribed the medication.  Refill your prescription for 20 mg tablets before you run out.  After discharge, you should have regular check-up appointments with your healthcare provider that is prescribing your Xarelto.  In the future your dose may need to be changed if your kidney function changes by a significant amount.  What do you do if you miss a dose? If you are taking Xarelto TWICE DAILY and you miss a dose, take it as soon as you remember. You may take two 15 mg tablets (total 30 mg) at the same time then resume your regularly scheduled 15 mg twice daily the next day.  If you are taking Xarelto ONCE DAILY and you miss a dose, take it as soon as you remember on the same day then continue your regularly scheduled once daily regimen the next day. Do not take two doses of Xarelto at the same time.   Important Safety Information Xarelto is a blood thinner medicine that can cause bleeding. You should call your healthcare provider right away if you experience any of the following: Bleeding from an injury or your nose that does not stop. Unusual colored urine (red or dark brown) or unusual colored stools (red or black). Unusual bruising for unknown reasons. A serious fall or if you hit your head (even if there is no bleeding).  Some medicines may interact with Xarelto and might increase your risk of bleeding while on Xarelto. To help avoid this, consult your healthcare provider or pharmacist prior to using any new  prescription or non-prescription medications, including herbals, vitamins, non-steroidal anti-inflammatory drugs (NSAIDs) and supplements.  This website has more information on Xarelto: [email or web address]

## 2021-08-17 NOTE — Progress Notes (Signed)
     [facility] Progress Note  Social visit.  Many thanks to Dr. Karleen Hampshire and the [facility] team for their care of Garrett [name].  No GI symptoms today. In fact, his dysphagia is the best that it has been since May 2022.  Dr. [name] has arrange for outpatient esophagram 08/29/21 and subsequent outpatient follow-up.   The inpatient GI team is available for any issues that develop during this hospitalization. Please call the on-call [place] gastroenterologist if needed.   LOS: 3 days   [name]  08/17/2021, 10:49 AM

## 2021-08-17 NOTE — Progress Notes (Signed)
PROGRESS NOTE    Garrett [name]  [id number] DOB: 1951-10-09 DOA: 08/14/2021 PCP: Ann Held, DO    Chief Complaint  Patient presents with   Shortness of Breath        Abdominal Pain   Post-op Problem    Brief Narrative:   Garrett [name] is a 70 y.o. male with medical history significant for chronic diastolic heart failure, type 2 diabetes mellitus, multiple prior unprovoked pulmonary emboli chronically anticoagulated on Xarelto, hyperlipidemia, hypertension, who is admitted to [facility] on 08/14/2021 with severe sepsis due to left-sided pneumonia after presenting from home to [facility] ED complaining of fever.  He underwent outpatient colonoscopy and EGD earlier today via Dr. [name] of [facility], with EGD indication for esophageal dilation in the setting of a history of esophageal stricture, while colonoscopy was performed reportedly for repeat routine screening purposes, with chart review revealing documentation of most recent previous colonoscopy in 2010.  No reported intraoperative complications, per chart review.  Patient was subsequently discharged to home from same-day surgery, however in the setting of progression of his subjective fever, chills, which he states started prior to undergoing colonoscopy/EGD today, he presented to [facility] emergency department for further evaluation . Pt seen and examined this am. He is weaned off oxygen, but still very sob on ambulation.    Assessment & Plan:   Principal Problem:   Severe sepsis ([place]) Active Problems:   Controlled type 2 diabetes mellitus with diabetic nephropathy (HCC)   Essential hypertension   GERD (gastroesophageal reflux disease)   Hyperlipidemia associated with type 2 diabetes mellitus (HCC)   Acute respiratory failure with hypoxia (HCC)   Pneumonia   Lactic acidosis   Hypokalemia   Severe sepsis secondary to left-sided pneumonia/ MULTI focal pneumonia.  Started the patient on broad-spectrum IV  antibiotics and transition to IV cefepime and Flagyl. MRSA PCR is negative, can discontinue IV vancomycin. CT chest abdomen pelvis does not show any evidence of esophageal or bowel perforation.  Repeat lactic acid have improved. Recheck levels tomorrow.  Continue with incentive spirometry and flutter valve.added duo nebs .  Weaned his oxygen off today.  But patient on ambulation still very short of breath and reports sharp chest pains radiating to the back on deep breathing/inspiration   Acute respiratory failure with hypoxia secondary to pneumonia. Patient weaned off oxygen today.  Continue with IV antibiotics for another 24 to 48 hours.   Chest pressure earlier this am.  Pleuritic chest pain as patient reports sharp pain on deep breathing/inspiration. Slightly elevated troponin's , pt reports the chest pressure improved after duoneb treatment.  Echocardiogram unremarkable.  EKG shows incomplete rbbb.     History of chronic diastolic heart failure Last echocardiogram in January 2020 showed preserved left ventricular ejection fraction with grade 1 diastolic dysfunction. Continue strict intake and output and daily weights. We will start him back on Lasix 40 mg daily.   Non-insulin-dependent type 2 diabetes mellitus Uncontrolled with hyperglycemia Continue with sliding scale insulin inpatient. CBG (last 3)  Recent Labs    08/16/21 1629 08/17/21 0643 08/17/21 1144  GLUCAP 188* 166* 151*      Hypertension Blood pressure parameters are well controlled     History of unprovoked acute pulmonary embolism Continue with Xarelto.   GERD Continue with PPI.  Hypokalemia Replaced  Hyperlipidemia Continue with statin   Elevated troponins probably from demand ischemia from sepsis.   DVT prophylaxis: Xarelto Code Status: (Full code) Family Communication: None  at bedside Disposition:   Status is: Inpatient  Remains inpatient appropriate because:Unsafe d/c plan and IV  treatments appropriate due to intensity of illness or inability to take PO  Dispo: The patient is from: Home              Anticipated d/c is to: Home              Patient currently is not medically stable to d/c.   Difficult to place patient No       Consultants:  None  Procedures: none.   Antimicrobials:  Antibiotics Given (last 72 hours)     Date/Time Action Medication Dose Rate   08/14/21 1750 New Bag/Given   ceFEPIme (MAXIPIME) 2 g in sodium chloride 0.9 % 100 mL IVPB 2 g 200 mL/hr   08/14/21 1822 New Bag/Given   metroNIDAZOLE (FLAGYL) IVPB 500 mg 500 mg 100 mL/hr   08/14/21 1956 New Bag/Given   vancomycin (VANCOREADY) IVPB 2000 mg/400 mL 2,000 mg 200 mL/hr   08/14/21 2338 New Bag/Given   metroNIDAZOLE (FLAGYL) IVPB 500 mg 500 mg 100 mL/hr   08/15/21 0522 New Bag/Given   ceFEPIme (MAXIPIME) 2 g in sodium chloride 0.9 % 100 mL IVPB 2 g 200 mL/hr   08/15/21 [id number] New Bag/Given   metroNIDAZOLE (FLAGYL) IVPB 500 mg 500 mg 100 mL/hr   08/15/21 1444 New Bag/Given   metroNIDAZOLE (FLAGYL) IVPB 500 mg 500 mg 100 mL/hr   08/15/21 1823 New Bag/Given   ceFEPIme (MAXIPIME) 2 g in sodium chloride 0.9 % 100 mL IVPB 2 g 200 mL/hr   08/15/21 2300 New Bag/Given   metroNIDAZOLE (FLAGYL) IVPB 500 mg 500 mg 100 mL/hr   08/16/21 0600 New Bag/Given   ceFEPIme (MAXIPIME) 2 g in sodium chloride 0.9 % 100 mL IVPB 2 g 200 mL/hr   08/16/21 0755 New Bag/Given   metroNIDAZOLE (FLAGYL) IVPB 500 mg 500 mg 100 mL/hr   08/16/21 1442 New Bag/Given   ceFEPIme (MAXIPIME) 2 g in sodium chloride 0.9 % 100 mL IVPB 2 g 200 mL/hr   08/16/21 1605 New Bag/Given   metroNIDAZOLE (FLAGYL) IVPB 500 mg 500 mg 100 mL/hr   08/16/21 2300 New Bag/Given   ceFEPIme (MAXIPIME) 2 g in sodium chloride 0.9 % 100 mL IVPB 2 g 200 mL/hr   08/17/21 0000 New Bag/Given   metroNIDAZOLE (FLAGYL) IVPB 500 mg 500 mg 100 mL/hr   08/17/21 [id number] New Bag/Given   metroNIDAZOLE (FLAGYL) IVPB 500 mg 500 mg 100 mL/hr   08/17/21 [id number] New  Bag/Given   ceFEPIme (MAXIPIME) 2 g in sodium chloride 0.9 % 100 mL IVPB 2 g 200 mL/hr         Subjective: Chest pain on deep inspiration.  Objective: Vitals:   08/16/21 2124 08/17/21 0614 08/17/21 0817 08/17/21 0956  BP: 134/68 (!) 151/77  131/74  Pulse: 82 70  66  Resp: 20 20  (!) 21  Temp: 99.1 F (37.3 C)   98.6 F (37 C)  TempSrc: Oral   Oral  SpO2: 93% 92% 91% 95%  Weight:      Height:        Intake/Output Summary (Last 24 hours) at 08/17/2021 1617 Last data filed at 08/17/2021 0850 Gross per 24 hour  Intake 1757.24 ml  Output 2550 ml  Net -792.76 ml    Filed Weights   08/15/21 1122  Weight: 103.6 kg    Examination:  General exam: Well-developed gentleman not in any kind of distress Respiratory system: Improving  air entry on the left side no wheezing heard on room air Cardiovascular system: S1-S2 heard regular rate rhythm no JVD Gastrointestinal system: Abdomen is soft, nontender bowel sounds normal Central nervous system: Alert and oriented Extremities: No pedal edema Skin: No rashes seen.  Psychiatry:  Mood is appropriate    Data Reviewed: I have personally reviewed following labs and imaging studies  CBC: Recent Labs  Lab 08/14/21 1651 08/15/21 0350 08/15/21 0511 08/16/21 0828 08/17/21 0529  WBC 11.0* 15.5*  --  12.4* 10.0  NEUTROABS 10.0* 13.6*  --  10.5* 8.2*  HGB 12.7* 11.0* 10.5* 11.1* 10.4*  HCT 39.5 35.6* 31.0* 34.6* 33.3*  MCV 88.8 91.5  --  89.2 88.3  PLT 236 219  --  224 229     Basic Metabolic Panel: Recent Labs  Lab 08/14/21 1651 08/15/21 0350 08/15/21 0511 08/15/21 1152 08/16/21 0828 08/17/21 0529  NA 136 140 143  --  137 138  K 3.0* 3.7 3.2*  --  3.5 3.4*  CL 108 111  --   --  111 111  CO2 18* 21*  --   --  19* 21*  GLUCOSE 229* 178*  --   --  217* 185*  BUN 14 12  --   --  13 11  CREATININE 1.34* 1.31*  --   --  1.11 1.04  CALCIUM 8.7* 8.9  --   --  8.8* 8.8*  MG  --  1.8  --  1.8  --   --   PHOS  --  2.7   --   --   --   --      GFR: Estimated Creatinine Clearance: 77.3 mL/min (by C-G formula based on SCr of 1.04 mg/dL).  Liver Function Tests: Recent Labs  Lab 08/14/21 1651 08/15/21 0350  AST 51* 28  ALT 52* 39  ALKPHOS 44 39  BILITOT 0.7 0.8  PROT 6.2* 5.5*  ALBUMIN 3.3* 2.9*     CBG: Recent Labs  Lab 08/16/21 0648 08/16/21 1150 08/16/21 1629 08/17/21 0643 08/17/21 1144  GLUCAP 176* 192* 188* 166* 151*      Recent Results (from the past 240 hour(s))  Blood Culture (routine x 2)     Status: None (Preliminary result)   Collection Time: 08/14/21  5:15 PM   Specimen: BLOOD RIGHT WRIST  Result Value Ref Range Status   Specimen Description BLOOD RIGHT WRIST  Final   Special Requests   Final    BOTTLES DRAWN AEROBIC AND ANAEROBIC Blood Culture adequate volume   Culture   Final    NO GROWTH 3 DAYS Performed at [facility], [place] [address], [place], [place] [address]    Report Status PENDING  Incomplete  Blood Culture (routine x 2)     Status: None (Preliminary result)   Collection Time: 08/14/21  5:25 PM   Specimen: BLOOD  Result Value Ref Range Status   Specimen Description BLOOD SITE NOT SPECIFIED  Final   Special Requests   Final    BOTTLES DRAWN AEROBIC AND ANAEROBIC Blood Culture adequate volume   Culture   Final    NO GROWTH 3 DAYS Performed at [facility], [address] [place], [place] [address]    Report Status PENDING  Incomplete  Resp Panel by RT-PCR (Flu A&B, Covid) Nasopharyngeal Swab     Status: None   Collection Time: 08/14/21  5:54 PM   Specimen: Nasopharyngeal Swab; Nasopharyngeal(NP) swabs in vial transport medium  Result Value Ref Range Status   SARS Coronavirus 2 by RT PCR NEGATIVE NEGATIVE Final    Comment: (NOTE) SARS-CoV-2 target nucleic acids are NOT DETECTED.  The SARS-CoV-2 RNA is generally detectable in upper respiratory specimens during the acute phase of infection. The lowest concentration of SARS-CoV-2 viral  copies this assay can detect is 138 copies/mL. A negative result does not preclude SARS-Cov-2 infection and should not be used as the sole basis for treatment or other patient management decisions. A negative result may occur with  improper specimen collection/handling, submission of specimen other than nasopharyngeal swab, presence of viral mutation(s) within the areas targeted by this assay, and inadequate number of viral copies(<138 copies/mL). A negative result must be combined with clinical observations, patient history, and epidemiological information. The expected result is Negative.  Fact Sheet for Patients:  EntrepreneurPulse.com.au  Fact Sheet for Healthcare Providers:  IncredibleEmployment.be  This test is no t yet approved or cleared by the [place] FDA and  has been authorized for detection and/or diagnosis of SARS-CoV-2 by FDA under an Emergency Use Authorization (EUA). This EUA will remain  in effect (meaning this test can be used) for the duration of the COVID-19 declaration under Section 564(b)(1) of the Act, 21 U.S.C.section 360bbb-3(b)(1), unless the authorization is terminated  or revoked sooner.       Influenza A by PCR NEGATIVE NEGATIVE Final   Influenza B by PCR NEGATIVE NEGATIVE Final    Comment: (NOTE) The Xpert Xpress SARS-CoV-2/FLU/RSV plus assay is intended as an aid in the diagnosis of influenza from Nasopharyngeal swab specimens and should not be used as a sole basis for treatment. Nasal washings and aspirates are unacceptable for Xpert Xpress SARS-CoV-2/FLU/RSV testing.  Fact Sheet for Patients: EntrepreneurPulse.com.au  Fact Sheet for Healthcare Providers: IncredibleEmployment.be  This test is not yet approved or cleared by the [place] FDA and has been authorized for detection and/or diagnosis of SARS-CoV-2 by FDA under an Emergency Use Authorization (EUA). This  EUA will remain in effect (meaning this test can be used) for the duration of the COVID-19 declaration under Section 564(b)(1) of the Act, 21 U.S.C. section 360bbb-3(b)(1), unless the authorization is terminated or revoked.  Performed at [facility], [place] [address], [place], [place] [address]   MRSA Next Gen by PCR, Nasal     Status: None   Collection Time: 08/15/21  3:23 AM   Specimen: Nasal Mucosa; Nasal Swab  Result Value Ref Range Status   MRSA by PCR Next Gen NOT DETECTED NOT DETECTED Final    Comment: (NOTE) The GeneXpert MRSA Assay (FDA approved for NASAL specimens only), is one component of a comprehensive MRSA colonization surveillance program. It is not intended to diagnose MRSA infection nor to guide or monitor treatment for MRSA infections. Test performance is not FDA approved in patients less than 46 years old. Performed at [facility], [place] [address] [place], [place] [address]           Radiology Studies: DG CHEST PORT 1 VIEW  Result Date: 08/16/2021 CLINICAL DATA:  Encounter for shortness of breath. EXAM: PORTABLE CHEST 1 VIEW COMPARISON:  02/26/2021 FINDINGS: Heart size normal. Lung volumes are low. Slight asymmetric elevation of right hemidiaphragm is unchanged. Persistent airspace disease within the left upper lobe and left lower lobe. Subsegmental atelectasis within the right midlung is unchanged. IMPRESSION: Persistent airspace disease within the left upper lobe and left lower lobe compatible with multifocal infection. Electronically Signed   By:  Kerby Moors M.D.   On: 08/16/2021 11:41   ECHOCARDIOGRAM COMPLETE  Result Date: 08/16/2021    ECHOCARDIOGRAM REPORT   Patient Name:   Moxon Behney Date of Exam: 08/16/2021 Medical Rec #:  [id number]      Height:       69.0 in Accession #:    [id number]     Weight:       228.4 lb Date of Birth:  1951/01/11      BSA:          2.186 m Patient Age:    67 years       BP:           134/66 mmHg Patient  Gender: M              HR:           81 bpm. Exam Location:  Inpatient Procedure: 2D Echo, Color Doppler and Cardiac Doppler Indications:    Dyspnea  History:        Patient has prior history of Echocardiogram examinations, most                 recent 03/03/2017. Pneumonia.  Sonographer:    Merrie Roof RDCS Referring Phys: [place]  1. Left ventricular ejection fraction, by estimation, is 60 to 65%. The left ventricle has normal function. The left ventricle has no regional wall motion abnormalities. Left ventricular diastolic parameters were normal.  2. Right ventricular systolic function is normal. The right ventricular size is normal.  3. The mitral valve is normal in structure. No evidence of mitral valve regurgitation. No evidence of mitral stenosis.  4. The aortic valve is tricuspid. Aortic valve regurgitation is trivial. Mild aortic valve sclerosis is present, with no evidence of aortic valve stenosis.  5. The inferior vena cava is normal in size with greater than 50% respiratory variability, suggesting right atrial pressure of 3 mmHg. FINDINGS  Left Ventricle: Left ventricular ejection fraction, by estimation, is 60 to 65%. The left ventricle has normal function. The left ventricle has no regional wall motion abnormalities. The left ventricular internal cavity size was normal in size. There is  no left ventricular hypertrophy. Left ventricular diastolic parameters were normal. Right Ventricle: The right ventricular size is normal. Right ventricular systolic function is normal. Left Atrium: Left atrial size was normal in size. Right Atrium: Right atrial size was normal in size. Pericardium: There is no evidence of pericardial effusion. Mitral Valve: The mitral valve is normal in structure. No evidence of mitral valve regurgitation. No evidence of mitral valve stenosis. Tricuspid Valve: The tricuspid valve is normal in structure. Tricuspid valve regurgitation is mild . No evidence of  tricuspid stenosis. Aortic Valve: The aortic valve is tricuspid. Aortic valve regurgitation is trivial. Mild aortic valve sclerosis is present, with no evidence of aortic valve stenosis. Aortic valve mean gradient measures 8.0 mmHg. Aortic valve peak gradient measures 15.2 mmHg. Aortic valve area, by VTI measures 2.47 cm. Pulmonic Valve: The pulmonic valve was grossly normal. Pulmonic valve regurgitation is not visualized. No evidence of pulmonic stenosis. Aorta: The aortic root is normal in size and structure. Venous: The inferior vena cava is normal in size with greater than 50% respiratory variability, suggesting right atrial pressure of 3 mmHg. IAS/Shunts: The interatrial septum was not well visualized.  LEFT VENTRICLE PLAX 2D LVIDd:         4.50 cm  Diastology LVIDs:         2.90  cm  LV e' medial:    10.30 cm/s LV PW:         1.20 cm  LV E/e' medial:  10.9 LV IVS:        1.10 cm  LV e' lateral:   12.20 cm/s LVOT diam:     1.90 cm  LV E/e' lateral: 9.2 LV SV:         89 LV SV Index:   41 LVOT Area:     2.84 cm  RIGHT VENTRICLE RV Basal diam:  3.70 cm LEFT ATRIUM             Index       RIGHT ATRIUM           Index LA diam:        4.40 cm 2.01 cm/m  RA Area:     23.40 cm LA Vol (A2C):   47.4 ml 21.68 ml/m RA Volume:   63.00 ml  28.82 ml/m LA Vol (A4C):   62.7 ml 28.68 ml/m LA Biplane Vol: 56.5 ml 25.85 ml/m  AORTIC VALVE AV Area (Vmax):    2.44 cm AV Area (Vmean):   2.35 cm AV Area (VTI):     2.47 cm AV Vmax:           195.00 cm/s AV Vmean:          128.000 cm/s AV VTI:            0.361 m AV Peak Grad:      15.2 mmHg AV Mean Grad:      8.0 mmHg LVOT Vmax:         168.00 cm/s LVOT Vmean:        106.000 cm/s LVOT VTI:          0.314 m LVOT/AV VTI ratio: 0.87  AORTA Ao Root diam: 3.10 cm Ao Asc diam:  2.90 cm MITRAL VALVE                TRICUSPID VALVE MV Area (PHT): 4.39 cm     TR Peak grad:   36.0 mmHg MV Decel Time: 173 msec     TR Vmax:        300.00 cm/s MV E velocity: 112.00 cm/s MV A velocity:  89.10 cm/s   SHUNTS MV E/A ratio:  1.26         Systemic VTI:  0.31 m                             Systemic Diam: 1.90 cm Kirk Ruths MD Electronically signed by Kirk Ruths MD Signature Date/Time: 08/16/2021/3:55:14 PM    Final         Scheduled Meds:  atorvastatin  80 mg Oral QPM   [START ON 08/18/2021] dapagliflozin propanediol  10 mg Oral QAC breakfast   fenofibrate  160 mg Oral Daily   fluticasone  2 spray Each Nare Daily   gabapentin  200 mg Oral QHS   insulin aspart  0-9 Units Subcutaneous TID WC   memantine  20 mg Oral Daily   metoprolol tartrate  50 mg Oral BID   mometasone-formoterol  2 puff Inhalation BID   pantoprazole  40 mg Oral Daily   rivaroxaban  20 mg Oral Q supper   verapamil  240 mg Oral QHS   Continuous Infusions:  ceFEPime (MAXIPIME) IV 200 mL/hr at 08/17/21 1543   metronidazole Stopped (08/17/21 0651)  LOS: 3 days       Hosie Poisson, MD [facility]   To contact the attending provider between 7A-7P or the covering provider during after hours 7P-7A, please log into the web site [email or web address] and access using universal [place] password for that web site. If you do not have the password, please call the hospital operator.  08/17/2021, 4:17 PM

## 2021-08-17 NOTE — Plan of Care (Signed)
  Problem: Fluid Volume: Goal: Hemodynamic stability will improve Outcome: Progressing   Problem: Clinical Measurements: Goal: Diagnostic test results will improve Outcome: Progressing   Problem: Respiratory: Goal: Ability to maintain adequate ventilation will improve Outcome: Progressing   Problem: Clinical Measurements: Goal: Will remain free from infection Outcome: Progressing Goal: Diagnostic test results will improve Outcome: Progressing

## 2021-08-17 NOTE — Progress Notes (Signed)
Pharmacy Antibiotic Note  Garrett [name] is a 70 y.o. male admitted on 08/14/2021 with  sepsis/PNA .  Pharmacy has been consulted for Cefepime dosing.  ID: Sepsis 2/2 PNA. LA 4.2>2.8>2.2. PC 6. WBC 10;  afeb  Metronidazole 8/23 >> Vancomycin 8/23 >> 8/24 Cefepime 8/23 >>   8/23 BCx >> IP 8/24: MRSA PCR: neg 8/23: COVID: neg   Plan: Cefepime 2g IV q8hr. Flagyl '500mg'$  IV q8hr Pharmacy will sign off. Please reconsult for further dosing assitance.      Height: '5\' 9"'$  (175.3 cm) Weight: 103.6 kg (228 lb 6.3 oz) IBW/kg (Calculated) : 70.7  Temp (24hrs), Avg:98.5 F (36.9 C), Min:98.2 F (36.8 C), Max:99.1 F (37.3 C)  Recent Labs  Lab 08/14/21 1651 08/14/21 1851 08/15/21 0305 08/15/21 0350 08/15/21 1152 08/16/21 0828 08/17/21 0529  WBC 11.0*  --   --  15.5*  --  12.4* 10.0  CREATININE 1.34*  --   --  1.31*  --  1.11 1.04  LATICACIDVEN 3.7* 3.5* 4.2*  --  2.8* 2.2*  --     Estimated Creatinine Clearance: 77.3 mL/min (by C-G formula based on SCr of 1.04 mg/dL).    Allergies  Allergen Reactions   Bee Venom Anaphylaxis   Garlic Swelling    Jataya Wann S. Alford Highland, PharmD, BCPS Clinical Staff Pharmacist [email or web address]  Wayland Salinas 08/17/2021 8:11 AM

## 2021-08-17 NOTE — Progress Notes (Signed)
Physical Therapy Treatment Patient Details Name: Garrett [name] MRN: [id number] DOB: November 12, 1951 Today's Date: 08/17/2021    History of Present Illness 70 y.o. male presents to [facility] ED on 08/14/2021 after colonoscopy and upper endoscopy earlier in the day. Pt with fever in ED, found to be septic 2/2 L sided PNA. PMH includes chronic diastolic heart failure, type 2 diabetes mellitus, multiple prior unprovoked pulmonary emboli chronically anticoagulated on Xarelto, hyperlipidemia, hypertension.    PT Comments    Pt ambulates for increased distances but continues to report significant DOE wit activity. Pt sats remain stable during all activity. Pt will benefit from continued acute PT POC to provide education on energy conservation strategies. Pt declined attempts at ambulation with rollator this session.   Follow Up Recommendations  Home health PT;Supervision - Intermittent     Equipment Recommendations  None recommended by PT    Recommendations for Other Services       Precautions / Restrictions Precautions Precautions: Fall Restrictions Weight Bearing Restrictions: No    Mobility  Bed Mobility                    Transfers Overall transfer level: Needs assistance Equipment used: Rolling walker (2 wheeled) Transfers: Sit to/from Stand Sit to Stand: Supervision         General transfer comment: increased time, rocking for momentum  Ambulation/Gait Ambulation/Gait assistance: Supervision Gait Distance (Feet): 180 Feet Assistive device: Rolling walker (2 wheeled) Gait Pattern/deviations: Step-through pattern Gait velocity: functional Gait velocity interpretation: 1.31 - 2.62 ft/sec, indicative of limited community ambulator General Gait Details: pt with increased trunk flexion near end of gait trial   Stairs             Wheelchair Mobility    Modified Rankin (Stroke Patients Only)       Balance Overall balance assessment: Needs  assistance Sitting-balance support: Feet supported;No upper extremity supported Sitting balance-Leahy Scale: Good     Standing balance support: No upper extremity supported;During functional activity Standing balance-Leahy Scale: Fair Standing balance comment: urinating at commode without UE support                            Cognition Arousal/Alertness: Awake/alert Behavior During Therapy: WFL for tasks assessed/performed Overall Cognitive Status: Within Functional Limits for tasks assessed                                        Exercises      General Comments General comments (skin integrity, edema, etc.): VSS on RA, pt does report significant DOE although sats in mid 90s throughout session      Pertinent Vitals/Pain Pain Assessment: No/denies pain    Home Living                      Prior Function            PT Goals (current goals can now be found in the care plan section) Acute Rehab PT Goals Patient Stated Goal: to improve activity tolerance Progress towards PT goals: Progressing toward goals    Frequency    Min 3X/week      PT Plan Current plan remains appropriate    Co-evaluation              AM-PAC PT "6 Clicks" Mobility   Outcome Measure  Help needed turning from your back to your side while in a flat bed without using bedrails?: None Help needed moving from lying on your back to sitting on the side of a flat bed without using bedrails?: None Help needed moving to and from a bed to a chair (including a wheelchair)?: A Little Help needed standing up from a chair using your arms (e.g., wheelchair or bedside chair)?: A Little Help needed to walk in hospital room?: A Little Help needed climbing 3-5 steps with a railing? : A Lot 6 Click Score: 19    End of Session   Activity Tolerance: Patient limited by fatigue Patient left: in chair;with call bell/phone within reach;with chair alarm set Nurse  Communication: Mobility status PT Visit Diagnosis: Other abnormalities of gait and mobility (R26.89)     Time: [id number] PT Time Calculation (min) (ACUTE ONLY): 14 min  Charges:  $Gait Training: 8-22 mins                     Zenaida Niece, PT, DPT Acute Rehabilitation Pager: [place] 08/17/2021, 12:45 PM

## 2021-08-18 LAB — BASIC METABOLIC PANEL
Anion gap: 9 (ref 5–15)
BUN: 12 mg/dL (ref 8–23)
CO2: 18 mmol/L — ABNORMAL LOW (ref 22–32)
Calcium: 9 mg/dL (ref 8.9–10.3)
Chloride: 111 mmol/L (ref 98–111)
Creatinine, Ser: 1.03 mg/dL (ref 0.61–1.24)
GFR, Estimated: >60 mL/min (ref >60)
Glucose, Bld: 181 mg/dL — ABNORMAL HIGH (ref 70–99)
Potassium: 3.4 mmol/L — ABNORMAL LOW (ref 3.5–5.1)
Sodium: 138 mmol/L (ref 135–145)

## 2021-08-18 LAB — GLUCOSE, CAPILLARY
Glucose-Capillary: 156 mg/dL — ABNORMAL HIGH (ref 70–99)
Glucose-Capillary: 136 mg/dL — ABNORMAL HIGH (ref 70–99)
Glucose-Capillary: 175 mg/dL — ABNORMAL HIGH (ref 70–99)
Glucose-Capillary: 147 mg/dL — ABNORMAL HIGH (ref 70–99)

## 2021-08-18 LAB — LACTIC ACID, PLASMA: Lactic Acid, Venous: 2.1 mmol/L (ref 0.5–1.9)

## 2021-08-18 MED ORDER — GUAIFENESIN-DM 100-10 MG/5ML PO SYRP
5.0000 mL | ORAL_SOLUTION | ORAL | Status: DC | PRN
  Administered 2021-08-18 – 2021-08-19 (×2): 5 mL via ORAL
  Filled 2021-08-18 (×2): qty 5

## 2021-08-18 MED ORDER — FUROSEMIDE 40 MG PO TABS
40.0000 mg | ORAL_TABLET | Freq: Once | ORAL | Status: AC
  Administered 2021-08-18: 40 mg via ORAL
  Filled 2021-08-18: qty 1

## 2021-08-18 MED ORDER — POTASSIUM CHLORIDE CRYS ER 20 MEQ PO TBCR
40.0000 meq | EXTENDED_RELEASE_TABLET | Freq: Once | ORAL | Status: AC
  Administered 2021-08-18: 40 meq via ORAL
  Filled 2021-08-18: qty 2

## 2021-08-18 NOTE — Plan of Care (Signed)
  Problem: Fluid Volume: Goal: Hemodynamic stability will improve Outcome: Progressing   Problem: Clinical Measurements: Goal: Diagnostic test results will improve Outcome: Progressing Goal: Signs and symptoms of infection will decrease Outcome: Progressing   Problem: Respiratory: Goal: Ability to maintain adequate ventilation will improve Outcome: Progressing   Problem: Education: Goal: Knowledge of General Education information will improve Description: Including pain rating scale, medication(s)/side effects and non-pharmacologic comfort measures Outcome: Progressing   Problem: Health Behavior/Discharge Planning: Goal: Ability to manage health-related needs will improve Outcome: Progressing   Problem: Clinical Measurements: Goal: Ability to maintain clinical measurements within normal limits will improve Outcome: Progressing Goal: Will remain free from infection Outcome: Progressing Goal: Diagnostic test results will improve Outcome: Progressing Goal: Respiratory complications will improve Outcome: Progressing Goal: Cardiovascular complication will be avoided Outcome: Progressing   Problem: Nutrition: Goal: Adequate nutrition will be maintained Outcome: Progressing   Problem: Pain Managment: Goal: General experience of comfort will improve Outcome: Progressing   Problem: Safety: Goal: Ability to remain free from injury will improve Outcome: Progressing

## 2021-08-18 NOTE — Progress Notes (Signed)
PROGRESS NOTE    Garrett [name]  [id number] DOB: 1951/03/16 DOA: 08/14/2021 PCP: Ann Held, DO    Chief Complaint  Patient presents with   Shortness of Breath        Abdominal Pain   Post-op Problem    Brief Narrative:   Garrett [name] is a 70 y.o. male with medical history significant for chronic diastolic heart failure, type 2 diabetes mellitus, multiple prior unprovoked pulmonary emboli chronically anticoagulated on Xarelto, hyperlipidemia, hypertension, who is admitted to [facility] on 08/14/2021 with severe sepsis due to left-sided pneumonia after presenting from home to [facility] ED complaining of fever.  He underwent outpatient colonoscopy and EGD earlier today via Dr. [name] of [facility], with EGD indication for esophageal dilation in the setting of a history of esophageal stricture, while colonoscopy was performed reportedly for repeat routine screening purposes, with chart review revealing documentation of most recent previous colonoscopy in 2010.  No reported intraoperative complications, per chart review.  Patient was subsequently discharged to home from same-day surgery, however in the setting of progression of his subjective fever, chills, which he states started prior to undergoing colonoscopy/EGD today, he presented to [facility] emergency department for further evaluation . He was seen and examined. He is weaned off oxygen, but still coughing while talking.    Assessment & Plan:   Principal Problem:   Severe sepsis ([place]) Active Problems:   Controlled type 2 diabetes mellitus with diabetic nephropathy (HCC)   Essential hypertension   GERD (gastroesophageal reflux disease)   Hyperlipidemia associated with type 2 diabetes mellitus (HCC)   Acute respiratory failure with hypoxia (HCC)   Pneumonia   Lactic acidosis   Hypokalemia   Severe sepsis secondary to left-sided pneumonia/ MULTI focal pneumonia.  Started the patient on broad-spectrum IV  antibiotics and transition to IV cefepime and Flagyl. MRSA PCR is negative, can discontinue IV vancomycin. CT chest abdomen pelvis does not show any evidence of esophageal or bowel perforation.  Repeat lactic acid have improved. Recheck levels tomorrow.  Continue with incentive spirometry and flutter valve.added duo nebs .  Weaned his oxygen off .  But patient on ambulation  a nd on talking. still very short of breath  . Recommend another 24 hours of IV antibiotics.   Acute respiratory failure with hypoxia secondary to pneumonia. Patient weaned off oxygen today.  Continue with IV antibiotics for another 24 to 48 hours.   Chest pressure earlier this am.  Pleuritic chest pain as patient reports sharp pain on deep breathing/inspiration. Slightly elevated troponin's , pt reports the chest pressure improved after duoneb treatment.  Echocardiogram unremarkable.  EKG shows incomplete rbbb.     History of chronic diastolic heart failure Last echocardiogram in January 2020 showed preserved left ventricular ejection fraction with grade 1 diastolic dysfunction. Continue strict intake and output and daily weights. Lasix 40 mg ordered today.    Non-insulin-dependent type 2 diabetes mellitus Uncontrolled with hyperglycemia Continue with sliding scale insulin inpatient. CBG (last 3)  Recent Labs    08/18/21 0641 08/18/21 1144 08/18/21 1634  GLUCAP 156* 136* 175*   No changes in meds.    Hypertension Blood pressure parameters are optimal.      History of unprovoked acute pulmonary embolism Continue with Xarelto.   GERD Continue with PPI.  Hypokalemia Replaced, recheck BMP in am.   Hyperlipidemia Continue with statin   Elevated troponins probably from demand ischemia from sepsis.   DVT prophylaxis: Xarelto Code Status: (  Full code) Family Communication: family at bedside.  Disposition:   Status is: Inpatient  Remains inpatient appropriate because:Unsafe d/c plan and  IV treatments appropriate due to intensity of illness or inability to take PO  Dispo: The patient is from: Home              Anticipated d/c is to: Home              Patient currently is not medically stable to d/c.   Difficult to place patient No       Consultants:  None  Procedures: none.   Antimicrobials:  Antibiotics Given (last 72 hours)     Date/Time Action Medication Dose Rate   08/15/21 1823 New Bag/Given   ceFEPIme (MAXIPIME) 2 g in sodium chloride 0.9 % 100 mL IVPB 2 g 200 mL/hr   08/15/21 2300 New Bag/Given   metroNIDAZOLE (FLAGYL) IVPB 500 mg 500 mg 100 mL/hr   08/16/21 0600 New Bag/Given   ceFEPIme (MAXIPIME) 2 g in sodium chloride 0.9 % 100 mL IVPB 2 g 200 mL/hr   08/16/21 0755 New Bag/Given   metroNIDAZOLE (FLAGYL) IVPB 500 mg 500 mg 100 mL/hr   08/16/21 1442 New Bag/Given   ceFEPIme (MAXIPIME) 2 g in sodium chloride 0.9 % 100 mL IVPB 2 g 200 mL/hr   08/16/21 1605 New Bag/Given   metroNIDAZOLE (FLAGYL) IVPB 500 mg 500 mg 100 mL/hr   08/16/21 2300 New Bag/Given   ceFEPIme (MAXIPIME) 2 g in sodium chloride 0.9 % 100 mL IVPB 2 g 200 mL/hr   08/17/21 0000 New Bag/Given   metroNIDAZOLE (FLAGYL) IVPB 500 mg 500 mg 100 mL/hr   08/17/21 0626 New Bag/Given   metroNIDAZOLE (FLAGYL) IVPB 500 mg 500 mg 100 mL/hr   08/17/21 0721 New Bag/Given   ceFEPIme (MAXIPIME) 2 g in sodium chloride 0.9 % 100 mL IVPB 2 g 200 mL/hr   08/17/21 1705 New Bag/Given   metroNIDAZOLE (FLAGYL) IVPB 500 mg 500 mg 100 mL/hr   08/17/21 2126 New Bag/Given   ceFEPIme (MAXIPIME) 2 g in sodium chloride 0.9 % 100 mL IVPB 2 g 200 mL/hr   08/17/21 2222 New Bag/Given   metroNIDAZOLE (FLAGYL) IVPB 500 mg 500 mg 100 mL/hr   08/18/21 0547 New Bag/Given   ceFEPIme (MAXIPIME) 2 g in sodium chloride 0.9 % 100 mL IVPB 2 g 200 mL/hr   08/18/21 [id number] New Bag/Given   metroNIDAZOLE (FLAGYL) IVPB 500 mg 500 mg 100 mL/hr   08/18/21 1436 New Bag/Given   ceFEPIme (MAXIPIME) 2 g in sodium chloride 0.9 % 100 mL  IVPB 2 g 200 mL/hr   08/18/21 1530 New Bag/Given   metroNIDAZOLE (FLAGYL) IVPB 500 mg 500 mg 100 mL/hr         Subjective: Sob on talking,   Objective: Vitals:   08/17/21 2041 08/18/21 0524 08/18/21 0807 08/18/21 1009  BP: (!) 153/89 (!) 143/69  134/89  Pulse: 71 66 67 73  Resp: '20 20 18 18  '$ Temp: 99.6 F (37.6 C) 98.1 F (36.7 C)  98.3 F (36.8 C)  TempSrc:  Oral  Oral  SpO2: 95% 94%  93%  Weight:      Height:        Intake/Output Summary (Last 24 hours) at 08/18/2021 1807 Last data filed at 08/18/2021 1700 Gross per 24 hour  Intake 1229.79 ml  Output 2601 ml  Net -1371.21 ml    Filed Weights   08/15/21 1122  Weight: 103.6 kg    Examination:  General exam: Well-developed gentleman no ndistress noted.  Respiratory system: air entry fair, no wheezing heard. On RA.  Cardiovascular system: S1-S2 heard RRR, no JVD, no pedal edema.  Gastrointestinal system: Abdomen is soft, nt bs+ Central nervous system: Alert and oriented. Non foca.l Extremities: No pedal edema Skin: No rashes seen.  Psychiatry:  Mood is appropriate    Data Reviewed: I have personally reviewed following labs and imaging studies  CBC: Recent Labs  Lab 08/14/21 1651 08/15/21 0350 08/15/21 0511 08/16/21 0828 08/17/21 0529  WBC 11.0* 15.5*  --  12.4* 10.0  NEUTROABS 10.0* 13.6*  --  10.5* 8.2*  HGB 12.7* 11.0* 10.5* 11.1* 10.4*  HCT 39.5 35.6* 31.0* 34.6* 33.3*  MCV 88.8 91.5  --  89.2 88.3  PLT 236 219  --  224 229     Basic Metabolic Panel: Recent Labs  Lab 08/14/21 1651 08/15/21 0350 08/15/21 0511 08/15/21 1152 08/16/21 0828 08/17/21 0529 08/18/21 1024  NA 136 140 143  --  137 138 138  K 3.0* 3.7 3.2*  --  3.5 3.4* 3.4*  CL 108 111  --   --  111 111 111  CO2 18* 21*  --   --  19* 21* 18*  GLUCOSE 229* 178*  --   --  217* 185* 181*  BUN 14 12  --   --  '13 11 12  '$ CREATININE 1.34* 1.31*  --   --  1.11 1.04 1.03  CALCIUM 8.7* 8.9  --   --  8.8* 8.8* 9.0  MG  --  1.8  --   1.8  --   --   --   PHOS  --  2.7  --   --   --   --   --      GFR: Estimated Creatinine Clearance: 78.1 mL/min (by C-G formula based on SCr of 1.03 mg/dL).  Liver Function Tests: Recent Labs  Lab 08/14/21 1651 08/15/21 0350  AST 51* 28  ALT 52* 39  ALKPHOS 44 39  BILITOT 0.7 0.8  PROT 6.2* 5.5*  ALBUMIN 3.3* 2.9*     CBG: Recent Labs  Lab 08/17/21 1630 08/17/21 2047 08/18/21 0641 08/18/21 1144 08/18/21 1634  GLUCAP 150* 159* 156* 136* 175*      Recent Results (from the past 240 hour(s))  Blood Culture (routine x 2)     Status: None (Preliminary result)   Collection Time: 08/14/21  5:15 PM   Specimen: BLOOD RIGHT WRIST  Result Value Ref Range Status   Specimen Description BLOOD RIGHT WRIST  Final   Special Requests   Final    BOTTLES DRAWN AEROBIC AND ANAEROBIC Blood Culture adequate volume   Culture   Final    NO GROWTH 4 DAYS Performed at [facility], [place] [address], [place], [place] [address]    Report Status PENDING  Incomplete  Blood Culture (routine x 2)     Status: None (Preliminary result)   Collection Time: 08/14/21  5:25 PM   Specimen: BLOOD  Result Value Ref Range Status   Specimen Description BLOOD SITE NOT SPECIFIED  Final   Special Requests   Final    BOTTLES DRAWN AEROBIC AND ANAEROBIC Blood Culture adequate volume   Culture   Final    NO GROWTH 4 DAYS Performed at [facility], [address], [place], [place] [address]    Report Status PENDING  Incomplete  Resp Panel by RT-PCR (Flu A&B, Covid) Nasopharyngeal Swab  Status: None   Collection Time: 08/14/21  5:54 PM   Specimen: Nasopharyngeal Swab; Nasopharyngeal(NP) swabs in vial transport medium  Result Value Ref Range Status   SARS Coronavirus 2 by RT PCR NEGATIVE NEGATIVE Final    Comment: (NOTE) SARS-CoV-2 target nucleic acids are NOT DETECTED.  The SARS-CoV-2 RNA is generally detectable in upper respiratory specimens during the acute phase of infection. The  lowest concentration of SARS-CoV-2 viral copies this assay can detect is 138 copies/mL. A negative result does not preclude SARS-Cov-2 infection and should not be used as the sole basis for treatment or other patient management decisions. A negative result may occur with  improper specimen collection/handling, submission of specimen other than nasopharyngeal swab, presence of viral mutation(s) within the areas targeted by this assay, and inadequate number of viral copies(<138 copies/mL). A negative result must be combined with clinical observations, patient history, and epidemiological information. The expected result is Negative.  Fact Sheet for Patients:  EntrepreneurPulse.com.au  Fact Sheet for Healthcare Providers:  IncredibleEmployment.be  This test is no t yet approved or cleared by the [place] FDA and  has been authorized for detection and/or diagnosis of SARS-CoV-2 by FDA under an Emergency Use Authorization (EUA). This EUA will remain  in effect (meaning this test can be used) for the duration of the COVID-19 declaration under Section 564(b)(1) of the Act, 21 U.S.C.section 360bbb-3(b)(1), unless the authorization is terminated  or revoked sooner.       Influenza A by PCR NEGATIVE NEGATIVE Final   Influenza B by PCR NEGATIVE NEGATIVE Final    Comment: (NOTE) The Xpert Xpress SARS-CoV-2/FLU/RSV plus assay is intended as an aid in the diagnosis of influenza from Nasopharyngeal swab specimens and should not be used as a sole basis for treatment. Nasal washings and aspirates are unacceptable for Xpert Xpress SARS-CoV-2/FLU/RSV testing.  Fact Sheet for Patients: EntrepreneurPulse.com.au  Fact Sheet for Healthcare Providers: IncredibleEmployment.be  This test is not yet approved or cleared by the [place] FDA and has been authorized for detection and/or diagnosis of SARS-CoV-2 by FDA under  an Emergency Use Authorization (EUA). This EUA will remain in effect (meaning this test can be used) for the duration of the COVID-19 declaration under Section 564(b)(1) of the Act, 21 U.S.C. section 360bbb-3(b)(1), unless the authorization is terminated or revoked.  Performed at [facility], [place] [address] [place], [place] [address]   MRSA Next Gen by PCR, Nasal     Status: None   Collection Time: 08/15/21  3:23 AM   Specimen: Nasal Mucosa; Nasal Swab  Result Value Ref Range Status   MRSA by PCR Next Gen NOT DETECTED NOT DETECTED Final    Comment: (NOTE) The GeneXpert MRSA Assay (FDA approved for NASAL specimens only), is one component of a comprehensive MRSA colonization surveillance program. It is not intended to diagnose MRSA infection nor to guide or monitor treatment for MRSA infections. Test performance is not FDA approved in patients less than 51 years old. Performed at [facility], [place] [address] [place], [place] [address]           Radiology Studies: No results found.      Scheduled Meds:  atorvastatin  80 mg Oral QPM   dapagliflozin propanediol  10 mg Oral QAC breakfast   fenofibrate  160 mg Oral Daily   fluticasone  2 spray Each Nare Daily   gabapentin  200 mg Oral QHS   insulin aspart  0-9 Units Subcutaneous TID  WC   memantine  20 mg Oral Daily   metoprolol tartrate  50 mg Oral BID   mometasone-formoterol  2 puff Inhalation BID   pantoprazole  40 mg Oral Daily   rivaroxaban  20 mg Oral Q supper   verapamil  240 mg Oral QHS   Continuous Infusions:  ceFEPime (MAXIPIME) IV 2 g (08/18/21 1436)   metronidazole 500 mg (08/18/21 1530)     LOS: 4 days       Hosie Poisson, MD [facility]   To contact the attending provider between 7A-7P or the covering provider during after hours 7P-7A, please log into the web site [email or web address] and access using universal [place] password for that web site. If you do not have the  password, please call the hospital operator.  08/18/2021, 6:07 PM

## 2021-08-19 LAB — BASIC METABOLIC PANEL
Anion gap: 10 (ref 5–15)
BUN: 12 mg/dL (ref 8–23)
CO2: 20 mmol/L — ABNORMAL LOW (ref 22–32)
Calcium: 9.5 mg/dL (ref 8.9–10.3)
Chloride: 109 mmol/L (ref 98–111)
Creatinine, Ser: 1.11 mg/dL (ref 0.61–1.24)
GFR, Estimated: >60 mL/min (ref >60)
Glucose, Bld: 159 mg/dL — ABNORMAL HIGH (ref 70–99)
Potassium: 3.5 mmol/L (ref 3.5–5.1)
Sodium: 139 mmol/L (ref 135–145)

## 2021-08-19 LAB — CULTURE, BLOOD (ROUTINE X 2)
Culture: NO GROWTH
Culture: NO GROWTH
Special Requests: ADEQUATE
Special Requests: ADEQUATE

## 2021-08-19 LAB — GLUCOSE, CAPILLARY
Glucose-Capillary: 235 mg/dL — ABNORMAL HIGH (ref 70–99)
Glucose-Capillary: 183 mg/dL — ABNORMAL HIGH (ref 70–99)
Glucose-Capillary: 179 mg/dL — ABNORMAL HIGH (ref 70–99)
Glucose-Capillary: 156 mg/dL — ABNORMAL HIGH (ref 70–99)

## 2021-08-19 NOTE — Progress Notes (Signed)
PROGRESS NOTE    Garrett [name]  [id number] DOB: July 20, 1951 DOA: 08/14/2021 PCP: Ann Held, DO    Chief Complaint  Patient presents with   Shortness of Breath        Abdominal Pain   Post-op Problem    Brief Narrative:   Garrett [name] is a 70 y.o. male with medical history significant for chronic diastolic heart failure, type 2 diabetes mellitus, multiple prior unprovoked pulmonary emboli chronically anticoagulated on Xarelto, hyperlipidemia, hypertension, who is admitted to [facility] on 08/14/2021 with severe sepsis due to left-sided pneumonia after presenting from home to [facility] ED complaining of fever.  He underwent outpatient colonoscopy and EGD earlier today via Dr. [name] of [facility], with EGD indication for esophageal dilation in the setting of a history of esophageal stricture, while colonoscopy was performed reportedly for repeat routine screening purposes, with chart review revealing documentation of most recent previous colonoscopy in 2010.  No reported intraoperative complications, per chart review.  Patient was subsequently discharged to home from same-day surgery, however in the setting of progression of his subjective fever, chills, which he states started prior to undergoing colonoscopy/EGD today, he presented to [facility] emergency department for further evaluation . Pt seen and examined at bedside.    Assessment & Plan:   Principal Problem:   Severe sepsis ([place]) Active Problems:   Controlled type 2 diabetes mellitus with diabetic nephropathy (HCC)   Essential hypertension   GERD (gastroesophageal reflux disease)   Hyperlipidemia associated with type 2 diabetes mellitus (HCC)   Acute respiratory failure with hypoxia (HCC)   Pneumonia   Lactic acidosis   Hypokalemia   Severe sepsis secondary to left-sided pneumonia/ Multi focal pneumonia.  Started the patient on broad-spectrum IV antibiotics and transition to IV cefepime and  Flagyl. MRSA PCR is negative, can discontinue IV vancomycin. CT chest abdomen pelvis does not show any evidence of esophageal or bowel perforation.  Recheck  Continue with incentive spirometry and flutter valve.added duo nebs .  Weaned his oxygen off .  But patient on ambulation  and on talking. still very short of breath  . Recommend another 24 hours of IV antibiotics.   Acute respiratory failure with hypoxia secondary to pneumonia. Patient weaned off oxygen today.  Continue with IV antibiotics for another 24 to 48 hours.   Chest pressure earlier this am.  Pleuritic chest pain as patient reports sharp pain on deep breathing/inspiration. Slightly elevated troponin's , pt reports the chest pressure improved after duoneb treatment.  Echocardiogram unremarkable.  EKG shows incomplete rbbb.     History of chronic diastolic heart failure Appears compensated.  Last echocardiogram in January 2020 showed preserved left ventricular ejection fraction with grade 1 diastolic dysfunction. Continue strict intake and output and daily weights.    Non-insulin-dependent type 2 diabetes mellitus Uncontrolled with hyperglycemia Continue with sliding scale insulin inpatient. CBG (last 3)  Recent Labs    08/18/21 2145 08/19/21 0748 08/19/21 1113  GLUCAP 147* 235* 183*   No changes in meds.    Hypertension Blood pressure parameters are optimal.      History of unprovoked acute pulmonary embolism Continue with Xarelto.   GERD Continue with PPI.  Hypokalemia Replaced, recheck BMP in am.   Hyperlipidemia Continue with statin   Elevated troponins probably from demand ischemia from sepsis.   DVT prophylaxis: Xarelto Code Status: (Full code) Family Communication: family at bedside.  Disposition:   Status is: Inpatient  Remains inpatient appropriate because:Unsafe  d/c plan and IV treatments appropriate due to intensity of illness or inability to take PO  Dispo: The patient is  from: Home              Anticipated d/c is to: Home              Patient currently is not medically stable to d/c.   Difficult to place patient No       Consultants:  None  Procedures: none.   Antimicrobials:  Antibiotics Given (last 72 hours)     Date/Time Action Medication Dose Rate   08/16/21 1605 New Bag/Given   metroNIDAZOLE (FLAGYL) IVPB 500 mg 500 mg 100 mL/hr   08/16/21 2300 New Bag/Given   ceFEPIme (MAXIPIME) 2 g in sodium chloride 0.9 % 100 mL IVPB 2 g 200 mL/hr   08/17/21 0000 New Bag/Given   metroNIDAZOLE (FLAGYL) IVPB 500 mg 500 mg 100 mL/hr   08/17/21 0626 New Bag/Given   metroNIDAZOLE (FLAGYL) IVPB 500 mg 500 mg 100 mL/hr   08/17/21 0721 New Bag/Given   ceFEPIme (MAXIPIME) 2 g in sodium chloride 0.9 % 100 mL IVPB 2 g 200 mL/hr   08/17/21 1705 New Bag/Given   metroNIDAZOLE (FLAGYL) IVPB 500 mg 500 mg 100 mL/hr   08/17/21 2126 New Bag/Given   ceFEPIme (MAXIPIME) 2 g in sodium chloride 0.9 % 100 mL IVPB 2 g 200 mL/hr   08/17/21 2222 New Bag/Given   metroNIDAZOLE (FLAGYL) IVPB 500 mg 500 mg 100 mL/hr   08/18/21 0547 New Bag/Given   ceFEPIme (MAXIPIME) 2 g in sodium chloride 0.9 % 100 mL IVPB 2 g 200 mL/hr   08/18/21 [id number] New Bag/Given   metroNIDAZOLE (FLAGYL) IVPB 500 mg 500 mg 100 mL/hr   08/18/21 1436 New Bag/Given   ceFEPIme (MAXIPIME) 2 g in sodium chloride 0.9 % 100 mL IVPB 2 g 200 mL/hr   08/18/21 1530 New Bag/Given   metroNIDAZOLE (FLAGYL) IVPB 500 mg 500 mg 100 mL/hr   08/18/21 2127 New Bag/Given   ceFEPIme (MAXIPIME) 2 g in sodium chloride 0.9 % 100 mL IVPB 2 g 200 mL/hr   08/19/21 0602 New Bag/Given   ceFEPIme (MAXIPIME) 2 g in sodium chloride 0.9 % 100 mL IVPB 2 g 200 mL/hr   08/19/21 1510 New Bag/Given   ceFEPIme (MAXIPIME) 2 g in sodium chloride 0.9 % 100 mL IVPB 2 g 200 mL/hr         Subjective: Improving breathing  Objective: Vitals:   08/18/21 2113 08/19/21 0510 08/19/21 0801 08/19/21 0920  BP: 134/72 118/72  120/72  Pulse: 64 67   63  Resp: '14 14  18  '$ Temp: 99.6 F (37.6 C) 98.2 F (36.8 C)  97.8 F (36.6 C)  TempSrc: Oral Oral  Oral  SpO2: 95% 93% 94% 93%  Weight:      Height:        Intake/Output Summary (Last 24 hours) at 08/19/2021 1537 Last data filed at 08/19/2021 1300 Gross per 24 hour  Intake 1063.5 ml  Output 1600 ml  Net -536.5 ml    Filed Weights   08/15/21 1122  Weight: 103.6 kg    Examination:  General exam:  well developed gentleman, not in distress.  Respiratory system: clear to auscultation, no wheezing heard.  Cardiovascular system: S1-S2 heard RRR, no JVD, no pedal edema.  Gastrointestinal system: Abdomen is soft, nontender non  distended bowel sounds wnl.  Central nervous system: Alert and oriented, non focal.  Extremities: No pedal edema.  Skin: No rashes seen.  Psychiatry:  Mood is appropriate.     Data Reviewed: I have personally reviewed following labs and imaging studies  CBC: Recent Labs  Lab 08/14/21 1651 08/15/21 0350 08/15/21 0511 08/16/21 0828 08/17/21 0529  WBC 11.0* 15.5*  --  12.4* 10.0  NEUTROABS 10.0* 13.6*  --  10.5* 8.2*  HGB 12.7* 11.0* 10.5* 11.1* 10.4*  HCT 39.5 35.6* 31.0* 34.6* 33.3*  MCV 88.8 91.5  --  89.2 88.3  PLT 236 219  --  224 229     Basic Metabolic Panel: Recent Labs  Lab 08/15/21 0350 08/15/21 0511 08/15/21 1152 08/16/21 0828 08/17/21 0529 08/18/21 1024 08/19/21 0243  NA 140 143  --  137 138 138 139  K 3.7 3.2*  --  3.5 3.4* 3.4* 3.5  CL 111  --   --  111 111 111 109  CO2 21*  --   --  19* 21* 18* 20*  GLUCOSE 178*  --   --  217* 185* 181* 159*  BUN 12  --   --  '13 11 12 12  '$ CREATININE 1.31*  --   --  1.11 1.04 1.03 1.11  CALCIUM 8.9  --   --  8.8* 8.8* 9.0 9.5  MG 1.8  --  1.8  --   --   --   --   PHOS 2.7  --   --   --   --   --   --      GFR: Estimated Creatinine Clearance: 72.4 mL/min (by C-G formula based on SCr of 1.11 mg/dL).  Liver Function Tests: Recent Labs  Lab 08/14/21 1651 08/15/21 0350  AST  51* 28  ALT 52* 39  ALKPHOS 44 39  BILITOT 0.7 0.8  PROT 6.2* 5.5*  ALBUMIN 3.3* 2.9*     CBG: Recent Labs  Lab 08/18/21 1144 08/18/21 1634 08/18/21 2145 08/19/21 0748 08/19/21 1113  GLUCAP 136* 175* 147* 235* 183*      Recent Results (from the past 240 hour(s))  Blood Culture (routine x 2)     Status: None   Collection Time: 08/14/21  5:15 PM   Specimen: BLOOD RIGHT WRIST  Result Value Ref Range Status   Specimen Description BLOOD RIGHT WRIST  Final   Special Requests   Final    BOTTLES DRAWN AEROBIC AND ANAEROBIC Blood Culture adequate volume   Culture   Final    NO GROWTH 5 DAYS Performed at [facility], [place] [address], [place], [place] [address]    Report Status 08/19/2021 FINAL  Final  Blood Culture (routine x 2)     Status: None   Collection Time: 08/14/21  5:25 PM   Specimen: BLOOD  Result Value Ref Range Status   Specimen Description BLOOD SITE NOT SPECIFIED  Final   Special Requests   Final    BOTTLES DRAWN AEROBIC AND ANAEROBIC Blood Culture adequate volume   Culture   Final    NO GROWTH 5 DAYS Performed at [facility], [place] [address] [place], [place] [address]    Report Status 08/19/2021 FINAL  Final  Resp Panel by RT-PCR (Flu A&B, Covid) Nasopharyngeal Swab     Status: None   Collection Time: 08/14/21  5:54 PM   Specimen: Nasopharyngeal Swab; Nasopharyngeal(NP) swabs in vial transport medium  Result Value Ref Range Status   SARS Coronavirus 2 by RT PCR NEGATIVE NEGATIVE Final    Comment: (NOTE) SARS-CoV-2 target nucleic acids  are NOT DETECTED.  The SARS-CoV-2 RNA is generally detectable in upper respiratory specimens during the acute phase of infection. The lowest concentration of SARS-CoV-2 viral copies this assay can detect is 138 copies/mL. A negative result does not preclude SARS-Cov-2 infection and should not be used as the sole basis for treatment or other patient management decisions. A negative result may occur with   improper specimen collection/handling, submission of specimen other than nasopharyngeal swab, presence of viral mutation(s) within the areas targeted by this assay, and inadequate number of viral copies(<138 copies/mL). A negative result must be combined with clinical observations, patient history, and epidemiological information. The expected result is Negative.  Fact Sheet for Patients:  EntrepreneurPulse.com.au  Fact Sheet for Healthcare Providers:  IncredibleEmployment.be  This test is no t yet approved or cleared by the [place] FDA and  has been authorized for detection and/or diagnosis of SARS-CoV-2 by FDA under an Emergency Use Authorization (EUA). This EUA will remain  in effect (meaning this test can be used) for the duration of the COVID-19 declaration under Section 564(b)(1) of the Act, 21 U.S.C.section 360bbb-3(b)(1), unless the authorization is terminated  or revoked sooner.       Influenza A by PCR NEGATIVE NEGATIVE Final   Influenza B by PCR NEGATIVE NEGATIVE Final    Comment: (NOTE) The Xpert Xpress SARS-CoV-2/FLU/RSV plus assay is intended as an aid in the diagnosis of influenza from Nasopharyngeal swab specimens and should not be used as a sole basis for treatment. Nasal washings and aspirates are unacceptable for Xpert Xpress SARS-CoV-2/FLU/RSV testing.  Fact Sheet for Patients: EntrepreneurPulse.com.au  Fact Sheet for Healthcare Providers: IncredibleEmployment.be  This test is not yet approved or cleared by the [place] FDA and has been authorized for detection and/or diagnosis of SARS-CoV-2 by FDA under an Emergency Use Authorization (EUA). This EUA will remain in effect (meaning this test can be used) for the duration of the COVID-19 declaration under Section 564(b)(1) of the Act, 21 U.S.C. section 360bbb-3(b)(1), unless the authorization is terminated  or revoked.  Performed at [facility], [place] [address] [place], [place] [address]   MRSA Next Gen by PCR, Nasal     Status: None   Collection Time: 08/15/21  3:23 AM   Specimen: Nasal Mucosa; Nasal Swab  Result Value Ref Range Status   MRSA by PCR Next Gen NOT DETECTED NOT DETECTED Final    Comment: (NOTE) The GeneXpert MRSA Assay (FDA approved for NASAL specimens only), is one component of a comprehensive MRSA colonization surveillance program. It is not intended to diagnose MRSA infection nor to guide or monitor treatment for MRSA infections. Test performance is not FDA approved in patients less than 53 years old. Performed at [facility], [place] [address] [place], [place] [address]           Radiology Studies: No results found.      Scheduled Meds:  atorvastatin  80 mg Oral QPM   dapagliflozin propanediol  10 mg Oral QAC breakfast   fenofibrate  160 mg Oral Daily   fluticasone  2 spray Each Nare Daily   gabapentin  200 mg Oral QHS   insulin aspart  0-9 Units Subcutaneous TID WC   memantine  20 mg Oral Daily   metoprolol tartrate  50 mg Oral BID   mometasone-formoterol  2 puff Inhalation BID   pantoprazole  40 mg Oral Daily   rivaroxaban  20 mg Oral Q supper   verapamil  240 mg Oral QHS   Continuous Infusions:  ceFEPime (MAXIPIME) IV 2 g (08/19/21 1510)     LOS: 5 days       Hosie Poisson, MD [facility]   To contact the attending provider between 7A-7P or the covering provider during after hours 7P-7A, please log into the web site [email or web address] and access using universal [place] password for that web site. If you do not have the password, please call the hospital operator.  08/19/2021, 3:37 PM

## 2021-08-20 ENCOUNTER — Inpatient Hospital Stay (HOSPITAL_COMMUNITY): Payer: Medicare Other

## 2021-08-20 ENCOUNTER — Telehealth: Payer: Self-pay | Admitting: Internal Medicine

## 2021-08-20 LAB — GLUCOSE, CAPILLARY
Glucose-Capillary: 139 mg/dL — ABNORMAL HIGH (ref 70–99)
Glucose-Capillary: 199 mg/dL — ABNORMAL HIGH (ref 70–99)

## 2021-08-20 MED ORDER — MAGIC MOUTHWASH: 5.0000 mL | Freq: Three times a day (TID) | ORAL | 0 refills | Status: AC | PRN

## 2021-08-20 MED ORDER — GUAIFENESIN-DM 100-10 MG/5ML PO SYRP: 5.0000 mL | ORAL_SOLUTION | ORAL | 0 refills | Status: DC | PRN

## 2021-08-20 MED ORDER — AMOXICILLIN-POT CLAVULANATE 875-125 MG PO TABS: 1.0000 | ORAL_TABLET | Freq: Two times a day (BID) | ORAL | 0 refills | Status: AC

## 2021-08-20 NOTE — Progress Notes (Signed)
DISCHARGE NOTE HOME Garrett [name] to be discharged Home per MD order. Discussed prescriptions and follow up appointments with the patient. Prescriptions given to patient; medication list explained in detail. Patient verbalized understanding.  Skin clean, dry and intact without evidence of skin break down, no evidence of skin tears noted. IV catheter discontinued intact. Site without signs and symptoms of complications. Dressing and pressure applied. Pt denies pain at the site currently. No complaints noted.  Patient free of lines, drains, and wounds.   An After Visit Summary (AVS) was printed and given to the patient. Patient escorted via wheelchair, and discharged home via private auto.  Arlyss Repress, RN

## 2021-08-20 NOTE — Progress Notes (Signed)
Physical Therapy Treatment Patient Details Name: TRAMPIS MELHORN MRN: [id number] DOB: 18-Apr-1951 Today's Date: 08/20/2021    History of Present Illness 70 y.o. male presents to [facility] ED on 08/14/2021 after colonoscopy and upper endoscopy earlier in the day. Pt with fever in ED, found to be septic 2/2 L sided PNA. PMH includes chronic diastolic heart failure, type 2 diabetes mellitus, multiple prior unprovoked pulmonary emboli chronically anticoagulated on Xarelto, hyperlipidemia, hypertension.    PT Comments    Pt with much improved activity tolerance, ambulating for increased distances. Pt is able to converse and mobilize for short distances without noticeable increase in work of breathing. With longer distances of ambulation the pt does report DOE. Pt will benefit from continued acute PT POC to aide in returning to prior level of function.   Follow Up Recommendations  Home health PT;Supervision - Intermittent     Equipment Recommendations  None recommended by PT    Recommendations for Other Services       Precautions / Restrictions Precautions Precautions: Fall Restrictions Weight Bearing Restrictions: No    Mobility  Bed Mobility Overal bed mobility: Modified Independent                  Transfers Overall transfer level: Needs assistance Equipment used: Rolling walker (2 wheeled) Transfers: Sit to/from Stand Sit to Stand: Supervision            Ambulation/Gait Ambulation/Gait assistance: Supervision Gait Distance (Feet): 400 Feet Assistive device: Rolling walker (2 wheeled) Gait Pattern/deviations: Step-through pattern Gait velocity: reduced Gait velocity interpretation: 1.31 - 2.62 ft/sec, indicative of limited community ambulator General Gait Details: pt with slowed step-through gait   Stairs             Wheelchair Mobility    Modified Rankin (Stroke Patients Only)       Balance Overall balance assessment: Needs  assistance Sitting-balance support: No upper extremity supported;Feet supported Sitting balance-Leahy Scale: Good     Standing balance support: Single extremity supported;Bilateral upper extremity supported Standing balance-Leahy Scale: Poor Standing balance comment: reliant on UE support of RW                            Cognition Arousal/Alertness: Awake/alert Behavior During Therapy: WFL for tasks assessed/performed Overall Cognitive Status: Within Functional Limits for tasks assessed                                        Exercises      General Comments General comments (skin integrity, edema, etc.): VSS on RA      Pertinent Vitals/Pain Pain Assessment: No/denies pain    Home Living                      Prior Function            PT Goals (current goals can now be found in the care plan section) Acute Rehab PT Goals Patient Stated Goal: to improve activity tolerance Progress towards PT goals: Progressing toward goals    Frequency    Min 3X/week      PT Plan Current plan remains appropriate    Co-evaluation              AM-PAC PT "6 Clicks" Mobility   Outcome Measure  Help needed turning from your back to your side while  in a flat bed without using bedrails?: None Help needed moving from lying on your back to sitting on the side of a flat bed without using bedrails?: None Help needed moving to and from a bed to a chair (including a wheelchair)?: A Little Help needed standing up from a chair using your arms (e.g., wheelchair or bedside chair)?: A Little Help needed to walk in hospital room?: A Little Help needed climbing 3-5 steps with a railing? : A Little 6 Click Score: 20    End of Session   Activity Tolerance: Patient tolerated treatment well Patient left: in bed;with call bell/phone within reach Nurse Communication: Mobility status PT Visit Diagnosis: Other abnormalities of gait and mobility (R26.89)      Time: [id number] PT Time Calculation (min) (ACUTE ONLY): 18 min  Charges:  $Therapeutic Activity: 8-22 mins                     Zenaida Niece, PT, DPT Acute Rehabilitation Pager: [place] 08/20/2021, 1:12 PM

## 2021-08-20 NOTE — Telephone Encounter (Signed)
MW please advise on appt for this pt.  Can we schedule this pt with a NP?   thanks

## 2021-08-21 NOTE — Telephone Encounter (Signed)
I have called the pts wife and LM on VM for her to call back.

## 2021-08-22 DIAGNOSIS — E1165 Type 2 diabetes mellitus with hyperglycemia: Secondary | ICD-10-CM

## 2021-08-22 DIAGNOSIS — I10 Essential (primary) hypertension: Secondary | ICD-10-CM | POA: Diagnosis not present

## 2021-08-22 DIAGNOSIS — E1169 Type 2 diabetes mellitus with other specified complication: Secondary | ICD-10-CM

## 2021-08-22 DIAGNOSIS — E785 Hyperlipidemia, unspecified: Secondary | ICD-10-CM

## 2021-08-22 DIAGNOSIS — I5032 Chronic diastolic (congestive) heart failure: Secondary | ICD-10-CM

## 2021-08-27 NOTE — Discharge Summary (Signed)
Physician Discharge Summary  Garrett [name] [id number] DOB: [birth year] DOA: 08/14/2021  PCP: Ann Held, DO  Admit date: 08/14/2021 Discharge date: 08/20/2021  Admitted From: Home.  Disposition:  Home   Recommendations for Outpatient Follow-up:  Follow up with PCP in 1-2 weeks Please obtain BMP/CBC in one week Please follow up with pulmonology in one week.    Discharge Condition:stable.  CODE STATUS:full code Diet recommendation: Heart Healthy   Brief/Interim Summary: Garrett [name] is a 70 y.o. male with medical history significant for chronic diastolic heart failure, type 2 diabetes mellitus, multiple prior unprovoked pulmonary emboli chronically anticoagulated on Xarelto, hyperlipidemia, hypertension, who is admitted to [facility] on 08/14/2021 with severe sepsis due to left-sided pneumonia after presenting from home to [facility] ED complaining of fever.  He underwent outpatient colonoscopy and EGD earlier today via Dr. [name] of [facility], with EGD indication for esophageal dilation in the setting of a history of esophageal stricture, while colonoscopy was performed reportedly for repeat routine screening purposes, with chart review revealing documentation of most recent previous colonoscopy in 2010.  No reported intraoperative complications, per chart review.  Patient was subsequently discharged to home from same-day surgery, however in the setting of progression of his subjective fever, chills, which he states started prior to undergoing colonoscopy/EGD today, he presented to [facility] emergency department for further evaluation .  Discharge Diagnoses:  Principal Problem:   Severe sepsis ([place]) Active Problems:   Controlled type 2 diabetes mellitus with diabetic nephropathy (HCC)   Essential hypertension   GERD (gastroesophageal reflux disease)   Hyperlipidemia associated with type 2 diabetes mellitus (HCC)   Acute respiratory failure with hypoxia (HCC)    Pneumonia   Lactic acidosis   Hypokalemia  Severe sepsis secondary to left-sided pneumonia/ Multi focal pneumonia.  Started the patient on broad-spectrum IV antibiotics and transition to IV cefepime and Flagyl. Discharged him on oral antibiotics to complete the course.  MRSA PCR is negative, can discontinue IV vancomycin. CT chest abdomen pelvis does not show any evidence of esophageal or bowel perforation.  Recheck  Continue with incentive spirometry and flutter valve.added duo nebs .  Weaned his oxygen off .     Acute respiratory failure with hypoxia secondary to pneumonia. Patient weaned off oxygen today.       Chest pressure earlier this am.  Pleuritic chest pain as patient reports sharp pain on deep breathing/inspiration. Slightly elevated troponin's , pt reports the chest pressure improved after duoneb treatment.  Echocardiogram unremarkable.  EKG shows incomplete rbbb.       History of chronic diastolic heart failure Appears compensated.  Last echocardiogram in January 2020 showed preserved left ventricular ejection fraction with grade 1 diastolic dysfunction. Continue strict intake and output and daily weights.      Non-insulin-dependent type 2 diabetes mellitus Uncontrolled with hyperglycemia Resume home meds on discharge.        Hypertension Blood pressure parameters are optimal.          History of unprovoked acute pulmonary embolism Continue with Xarelto.     GERD Continue with PPI.   Hypokalemia Replaced, recheck BMP in am.    Hyperlipidemia Continue with statin     Elevated troponins probably from demand ischemia from sepsis  Discharge Instructions  Discharge Instructions     Diet - low sodium heart healthy   Complete by: As directed    Discharge instructions   Complete by: As directed    Please follow up  with Dr Melvyn Novas in 1 to 2 weeks.   Increase activity slowly   Complete by: As directed       Allergies as of 08/20/2021        Reactions   Bee Venom Anaphylaxis   Garlic Swelling        Medication List     TAKE these medications    albuterol (2.5 MG/3ML) 0.083% nebulizer solution Commonly known as: PROVENTIL Take 3 mLs (2.5 mg total) by nebulization every 6 (six) hours as needed for wheezing or shortness of breath. What changed: Another medication with the same name was changed. Make sure you understand how and when to take each.   ProAir HFA 108 (90 Base) MCG/ACT inhaler Generic drug: albuterol Inhale 1 puff into the lungs every 6 (six) hours as needed for wheezing or shortness of breath. What changed: how much to take   allopurinol 100 MG tablet Commonly known as: ZYLOPRIM TAKE 1 TABLET BY MOUTH  DAILY   atorvastatin 80 MG tablet Commonly known as: LIPITOR Take 80 mg by mouth every evening.   budesonide-formoterol 80-4.5 MCG/ACT inhaler Commonly known as: SYMBICORT Inhale 2 puffs into the lungs daily.   dapagliflozin propanediol 10 MG Tabs tablet Commonly known as: Farxiga Take 1 tablet (10 mg total) by mouth daily before breakfast.   EPINEPHrine 0.3 mg/0.3 mL Soaj injection Commonly known as: EPI-PEN Inject 0.3 mg into the muscle as needed for anaphylaxis.   famotidine 20 MG tablet Commonly known as: Pepcid Take 1 tablet (20 mg total) by mouth at bedtime.   fenofibrate 160 MG tablet TAKE 1 TABLET BY MOUTH  DAILY What changed: when to take this   fluticasone 50 MCG/ACT nasal spray Commonly known as: FLONASE Place 2 sprays into both nostrils daily.   furosemide 40 MG tablet Commonly known as: LASIX Take 40 mg by mouth See admin instructions. '40mg'$  in the morning  20 mg in evening   gabapentin 100 MG capsule Commonly known as: NEURONTIN TAKE 2 CAPSULES BY MOUTH AT BEDTIME   guaiFENesin-dextromethorphan 100-10 MG/5ML syrup Commonly known as: ROBITUSSIN DM Take 5 mLs by mouth every 4 (four) hours as needed for cough.   levocetirizine 5 MG tablet Commonly known as: XYZAL Take  1 tablet (5 mg total) by mouth every evening.   magnesium oxide 400 (241.3 Mg) MG tablet Commonly known as: MAG-OX Take 1 tablet (400 mg total) by mouth daily.   meclizine 25 MG tablet Commonly known as: ANTIVERT Take 1 tablet (25 mg total) by mouth 3 (three) times daily as needed for dizziness. What changed: when to take this   memantine 10 MG tablet Commonly known as: NAMENDA TAKE 2 TABLETS BY MOUTH  DAILY   metFORMIN 500 MG tablet Commonly known as: GLUCOPHAGE TAKE 1 TABLET BY MOUTH  TWICE DAILY WITH A MEAL What changed: when to take this   metoprolol tartrate 50 MG tablet Commonly known as: LOPRESSOR Take 50 mg by mouth 2 (two) times daily.   nitroGLYCERIN 0.4 MG SL tablet Commonly known as: NITROSTAT Place 0.4 mg under the tongue every 5 (five) minutes as needed for chest pain.   omeprazole 40 MG capsule Commonly known as: PRILOSEC Take 40 mg by mouth 2 (two) times daily.   ONE-A-DAY WEIGHT SMART ADVANCE PO Take 1 tablet by mouth daily. Centrum Silver   OneTouch Ultra test strip Generic drug: glucose blood USE AS DIRECTED THREE TIMES DAILY.   potassium chloride SA 20 MEQ tablet Commonly known as: KLOR-CON TAKE  2 TABLETS BY MOUTH  DAILY What changed:  how much to take when to take this   SUMAtriptan 50 MG tablet Commonly known as: IMITREX Take 1 tablet as needed for migraine/vertigo. Do not take more than 3 a week What changed:  how much to take how to take this when to take this reasons to take this additional instructions   tadalafil 20 MG tablet Commonly known as: CIALIS Take 0.5-1 tablets (10-20 mg total) by mouth every other day as needed for erectile dysfunction.   topiramate 50 MG tablet Commonly known as: TOPAMAX Take 1 tablet (50 mg total) by mouth 2 (two) times daily.   verapamil 240 MG CR tablet Commonly known as: CALAN-SR TAKE 1 TABLET BY MOUTH AT  BEDTIME   vitamin B-12 1000 MCG tablet Commonly known as: CYANOCOBALAMIN Take 1,000  mcg by mouth daily.   Xarelto 20 MG Tabs tablet Generic drug: rivaroxaban TAKE 1 TABLET BY MOUTH  DAILY WITH SUPPER What changed: how much to take       ASK your doctor about these medications    amoxicillin-clavulanate 875-125 MG tablet Commonly known as: Augmentin Take 1 tablet by mouth every 12 (twelve) hours for 4 days. Ask about: Should I take this medication?   celecoxib 200 MG capsule Commonly known as: CELEBREX TAKE 1 CAPSULE BY MOUTH  TWICE DAILY Ask about: Which instructions should I use?   magic mouthwash Soln Take 5 mLs by mouth 3 (three) times daily as needed for up to 5 days for mouth pain. Ask about: Should I take this medication?        Follow-up Information     [facility] Follow up.   Specialty: Home Health Services Why: Someone will call you to schedule your first home visit. Contact information: [address] [place] [address] [phone]                Allergies  Allergen Reactions   Bee Venom Anaphylaxis   Garlic Swelling    Consultations: None.    Procedures/Studies: CT CHEST ABDOMEN PELVIS W CONTRAST  Result Date: 08/14/2021 CLINICAL DATA:  Esophageal perforation. History provided on radiograph earlier today states: Colonoscopy and endoscopy earlier today. EXAM: CT CHEST, ABDOMEN, AND PELVIS WITH CONTRAST TECHNIQUE: Multidetector CT imaging of the chest, abdomen and pelvis was performed following the standard protocol during bolus administration of intravenous contrast. CONTRAST:  180m OMNIPAQUE IOHEXOL 300 MG/ML  SOLN COMPARISON:  Radiographs earlier today, chest CT 09/29/2020, abdomen pelvis CT 07/31/2020 FINDINGS: CT CHEST FINDINGS Cardiovascular: Normal caliber thoracic aorta. Normal heart size. No pericardial effusion. There are coronary artery calcifications. Mediastinum/Nodes: There is no pneumomediastinum. No paraesophageal air. Minimal wall thickening of the distal esophagus. There is no  paraesophageal fluid. Scattered mediastinal lymph nodes are not enlarged by size criteria. No thyroid nodule. Lungs/Pleura: No pneumothorax. Patchy, nodular and ground-glass airspace disease throughout the left lung, greatest in the left lower lobe. Minimal patchy opacities in the right lower lobe. There is a bandlike opacity in the perifissural right lower and middle lobe. No pleural fluid. Scattered pulmonary nodules throughout the right lung, not significantly changed from prior exam, majority are pleural based. No debris within the tracheobronchial tree. Musculoskeletal: Thoracic spondylosis with multilevel endplate spurring. Sclerotic focus in the right anterior third rib, unchanged. Lucency within the posterior left second rib is unchanged. Lucent lesion within the right scapula is stable from prior exam and considered benign. No acute osseous abnormalities. CT ABDOMEN PELVIS FINDINGS  Hepatobiliary: Hepatic steatosis. Similar cysts in the liver from prior exam. Gallbladder physiologically distended, no calcified stone. No biliary dilatation. Pancreas: No ductal dilatation or inflammation. Spleen: Normal in size without focal abnormality. Adrenals/Urinary Tract: Normal adrenal glands. No hydronephrosis. Homogeneous renal enhancement with symmetric excretion on delayed phase imaging. Mild symmetric perinephric edema also seen on prior exam. Urinary bladder is physiologically distended without wall thickening. Stomach/Bowel: Postsurgical change of the gastroesophageal junction with Nissen fundoplication. There is no evidence of extraluminal air. Fluid distends the stomach. There is no small bowel obstruction or inflammation. Mild colonic diverticulosis without diverticulitis. Normal appendix. Vascular/Lymphatic: Aortic atherosclerosis. No aortic aneurysm. Multiple small central mesenteric lymph nodes which are similar to prior exam. No bulky abdominopelvic adenopathy. Reproductive: Enlarged prostate gland spans  5.5 cm transverse. Other: Trace free fluid in the dependent pelvis. No free air. No abscess. Postsurgical change of the anterior abdominal wall with suspected prior hernia repair. Small fat containing umbilical hernia. Fat in both inguinal canals. Musculoskeletal: Posterior rod with intrapedicular screw fusion L3-L4 with interbody spacers. No acute osseous abnormalities are seen. IMPRESSION: 1. No evidence of esophageal or bowel perforation. 2. Postsurgical change of the gastroesophageal junction with Nissen fundoplication. No evidence of extraluminal air or free fluid. Mild wall thickening of the distal esophagus is nonspecific. 3. Patchy, nodular and ground-glass airspace disease throughout the left lung, greatest in the left lower lobe. Findings are suspicious for pneumonia, including aspiration given recent endoscopy. 4. Scattered pulmonary nodules throughout the right lung, not significantly changed from prior exam. 5. Hepatic steatosis. Colonic diverticulosis without diverticulitis. 6. Enlarged prostate gland. Aortic Atherosclerosis (ICD10-I70.0). Electronically Signed   By: Keith Rake M.D.   On: 08/14/2021 19:53   DG CHEST PORT 1 VIEW  Result Date: 08/20/2021 CLINICAL DATA:  Cough and shortness of breath for 1 week EXAM: PORTABLE CHEST 1 VIEW COMPARISON:  08/16/2021 FINDINGS: Cardiac shadow is stable. Improving aeration in the left lung is noted although some residual airspace opacity is noted. Right lung is hypoinflated but clear. No bony abnormality is seen. Postsurgical changes in the cervical spine are noted. IMPRESSION: Patchy airspace disease in the left lung although improved when compared with the prior study. Electronically Signed   By: Inez Catalina M.D.   On: 08/20/2021 08:24   DG CHEST PORT 1 VIEW  Result Date: 08/16/2021 CLINICAL DATA:  Encounter for shortness of breath. EXAM: PORTABLE CHEST 1 VIEW COMPARISON:  02/26/2021 FINDINGS: Heart size normal. Lung volumes are low. Slight  asymmetric elevation of right hemidiaphragm is unchanged. Persistent airspace disease within the left upper lobe and left lower lobe. Subsegmental atelectasis within the right midlung is unchanged. IMPRESSION: Persistent airspace disease within the left upper lobe and left lower lobe compatible with multifocal infection. Electronically Signed   By: Kerby Moors M.D.   On: 08/16/2021 11:41   DG Abdomen Acute W/Chest  Result Date: 08/14/2021 CLINICAL DATA:  Shortness of breath and abdominal pain after endoscopy. EXAM: DG ABDOMEN ACUTE WITH 1 VIEW CHEST COMPARISON:  Chest x-ray dated February 26, 2021. Abdominal x-ray dated February 25, 2021. FINDINGS: There is no evidence of dilated bowel loops or free intraperitoneal air. No radiopaque calculi or other significant radiographic abnormality is seen. Heart size and mediastinal contours are within normal limits. Patchy consolidation in the left lower lobe. No pneumothorax or pleural effusion. IMPRESSION: 1. Negative abdominal radiographs. 2. Left lower lobe pneumonia. Electronically Signed   By: Titus Dubin M.D.   On: 08/14/2021 19:13   ECHOCARDIOGRAM COMPLETE  Result Date: 08/16/2021    ECHOCARDIOGRAM REPORT   Patient Name:   Excell Bostock Date of Exam: 08/16/2021 Medical Rec #:  [id number]      Height:       69.0 in Accession #:    [id number]     Weight:       228.4 lb Date of Birth:  Aug 02, 1951      BSA:          2.186 m Patient Age:    69 years       BP:           134/66 mmHg Patient Gender: M              HR:           81 bpm. Exam Location:  Inpatient Procedure: 2D Echo, Color Doppler and Cardiac Doppler Indications:    Dyspnea  History:        Patient has prior history of Echocardiogram examinations, most                 recent 03/03/2017. Pneumonia.  Sonographer:    Merrie Roof RDCS Referring Phys: [place]  1. Left ventricular ejection fraction, by estimation, is 60 to 65%. The left ventricle has normal function. The left ventricle has  no regional wall motion abnormalities. Left ventricular diastolic parameters were normal.  2. Right ventricular systolic function is normal. The right ventricular size is normal.  3. The mitral valve is normal in structure. No evidence of mitral valve regurgitation. No evidence of mitral stenosis.  4. The aortic valve is tricuspid. Aortic valve regurgitation is trivial. Mild aortic valve sclerosis is present, with no evidence of aortic valve stenosis.  5. The inferior vena cava is normal in size with greater than 50% respiratory variability, suggesting right atrial pressure of 3 mmHg. FINDINGS  Left Ventricle: Left ventricular ejection fraction, by estimation, is 60 to 65%. The left ventricle has normal function. The left ventricle has no regional wall motion abnormalities. The left ventricular internal cavity size was normal in size. There is  no left ventricular hypertrophy. Left ventricular diastolic parameters were normal. Right Ventricle: The right ventricular size is normal. Right ventricular systolic function is normal. Left Atrium: Left atrial size was normal in size. Right Atrium: Right atrial size was normal in size. Pericardium: There is no evidence of pericardial effusion. Mitral Valve: The mitral valve is normal in structure. No evidence of mitral valve regurgitation. No evidence of mitral valve stenosis. Tricuspid Valve: The tricuspid valve is normal in structure. Tricuspid valve regurgitation is mild . No evidence of tricuspid stenosis. Aortic Valve: The aortic valve is tricuspid. Aortic valve regurgitation is trivial. Mild aortic valve sclerosis is present, with no evidence of aortic valve stenosis. Aortic valve mean gradient measures 8.0 mmHg. Aortic valve peak gradient measures 15.2 mmHg. Aortic valve area, by VTI measures 2.47 cm. Pulmonic Valve: The pulmonic valve was grossly normal. Pulmonic valve regurgitation is not visualized. No evidence of pulmonic stenosis. Aorta: The aortic root is  normal in size and structure. Venous: The inferior vena cava is normal in size with greater than 50% respiratory variability, suggesting right atrial pressure of 3 mmHg. IAS/Shunts: The interatrial septum was not well visualized.  LEFT VENTRICLE PLAX 2D LVIDd:         4.50 cm  Diastology LVIDs:         2.90 cm  LV e' medial:    10.30 cm/s LV PW:  1.20 cm  LV E/e' medial:  10.9 LV IVS:        1.10 cm  LV e' lateral:   12.20 cm/s LVOT diam:     1.90 cm  LV E/e' lateral: 9.2 LV SV:         89 LV SV Index:   41 LVOT Area:     2.84 cm  RIGHT VENTRICLE RV Basal diam:  3.70 cm LEFT ATRIUM             Index       RIGHT ATRIUM           Index LA diam:        4.40 cm 2.01 cm/m  RA Area:     23.40 cm LA Vol (A2C):   47.4 ml 21.68 ml/m RA Volume:   63.00 ml  28.82 ml/m LA Vol (A4C):   62.7 ml 28.68 ml/m LA Biplane Vol: 56.5 ml 25.85 ml/m  AORTIC VALVE AV Area (Vmax):    2.44 cm AV Area (Vmean):   2.35 cm AV Area (VTI):     2.47 cm AV Vmax:           195.00 cm/s AV Vmean:          128.000 cm/s AV VTI:            0.361 m AV Peak Grad:      15.2 mmHg AV Mean Grad:      8.0 mmHg LVOT Vmax:         168.00 cm/s LVOT Vmean:        106.000 cm/s LVOT VTI:          0.314 m LVOT/AV VTI ratio: 0.87  AORTA Ao Root diam: 3.10 cm Ao Asc diam:  2.90 cm MITRAL VALVE                TRICUSPID VALVE MV Area (PHT): 4.39 cm     TR Peak grad:   36.0 mmHg MV Decel Time: 173 msec     TR Vmax:        300.00 cm/s MV E velocity: 112.00 cm/s MV A velocity: 89.10 cm/s   SHUNTS MV E/A ratio:  1.26         Systemic VTI:  0.31 m                             Systemic Diam: 1.90 cm Kirk Ruths MD Electronically signed by Kirk Ruths MD Signature Date/Time: 08/16/2021/3:55:14 PM    Final       Subjective: No new complaints.   Discharge Exam: Vitals:   08/20/21 0552 08/20/21 0947  BP: 138/76 133/81  Pulse: 60 67  Resp: 18 18  Temp: 98.1 F (36.7 C) 98 F (36.7 C)  SpO2: 94% 93%   Vitals:   08/19/21 2108 08/19/21 2133  08/20/21 0552 08/20/21 0947  BP: 132/74  138/76 133/81  Pulse: 66  60 67  Resp: '18  18 18  '$ Temp: 98.4 F (36.9 C)  98.1 F (36.7 C) 98 F (36.7 C)  TempSrc:   Oral Oral  SpO2: 93% 95% 94% 93%  Weight:      Height:        General: Pt is alert, awake, not in acute distress Cardiovascular: RRR, S1/S2 +, no rubs, no gallops Respiratory: CTA bilaterally, no wheezing, no rhonchi Abdominal: Soft, NT, ND, bowel sounds + Extremities: no edema, no cyanosis  The results of significant diagnostics from this hospitalization (including imaging, microbiology, ancillary and laboratory) are listed below for reference.     Microbiology: No results found for this or any previous visit (from the past 240 hour(s)).   Labs: BNP (last 3 results) Recent Labs    02/26/21 0125 02/27/21 0230 08/15/21 1152  BNP 123.4* 66.9 [id number]   Basic Metabolic Panel: No results for input(s): NA, K, CL, CO2, GLUCOSE, BUN, CREATININE, CALCIUM, MG, PHOS in the last 168 hours. Liver Function Tests: No results for input(s): AST, ALT, ALKPHOS, BILITOT, PROT, ALBUMIN in the last 168 hours. No results for input(s): LIPASE, AMYLASE in the last 168 hours. No results for input(s): AMMONIA in the last 168 hours. CBC: No results for input(s): WBC, NEUTROABS, HGB, HCT, MCV, PLT in the last 168 hours. Cardiac Enzymes: No results for input(s): CKTOTAL, CKMB, CKMBINDEX, TROPONINI in the last 168 hours. BNP: Invalid input(s): POCBNP CBG: No results for input(s): GLUCAP in the last 168 hours. D-Dimer No results for input(s): DDIMER in the last 72 hours. Hgb A1c No results for input(s): HGBA1C in the last 72 hours. Lipid Profile No results for input(s): CHOL, HDL, LDLCALC, TRIG, CHOLHDL, LDLDIRECT in the last 72 hours. Thyroid function studies No results for input(s): TSH, T4TOTAL, T3FREE, THYROIDAB in the last 72 hours.  Invalid input(s): FREET3 Anemia work up No results for input(s): VITAMINB12, FOLATE,  FERRITIN, TIBC, IRON, RETICCTPCT in the last 72 hours. Urinalysis    Component Value Date/Time   COLORURINE YELLOW 08/14/2021 2205   APPEARANCEUR CLEAR 08/14/2021 2205   LABSPEC 1.027 08/14/2021 2205   PHURINE 6.0 08/14/2021 2205   GLUCOSEU >=500 (A) 08/14/2021 2205   GLUCOSEU NEGATIVE 09/28/2018 1722   HGBUR NEGATIVE 08/14/2021 2205   BILIRUBINUR NEGATIVE 08/14/2021 2205   BILIRUBINUR neg 09/19/2016 1054   KETONESUR NEGATIVE 08/14/2021 2205   PROTEINUR NEGATIVE 08/14/2021 2205   UROBILINOGEN 0.2 09/28/2018 1722   NITRITE NEGATIVE 08/14/2021 2205   LEUKOCYTESUR NEGATIVE 08/14/2021 2205   Sepsis Labs Invalid input(s): PROCALCITONIN,  WBC,  LACTICIDVEN Microbiology No results found for this or any previous visit (from the past 240 hour(s)).   Time coordinating discharge: 38 minutes.   SIGNED:   Hosie Poisson, MD  [facility]

## 2021-08-28 ENCOUNTER — Telehealth: Payer: Self-pay

## 2021-08-28 DIAGNOSIS — H903 Sensorineural hearing loss, bilateral: Secondary | ICD-10-CM | POA: Diagnosis not present

## 2021-08-28 DIAGNOSIS — Z974 Presence of external hearing-aid: Secondary | ICD-10-CM | POA: Diagnosis not present

## 2021-08-28 NOTE — Telephone Encounter (Signed)
Transition Care Management Follow-up Telephone Call Date of discharge and from where: 08/20/21-[place] How have you been since you were released from the hospital? Doing fair. Still coughing a little. Any questions or concerns? No  Items Reviewed: Did the pt receive and understand the discharge instructions provided? Yes  Medications obtained and verified? Yes  Other? Yes  Any new allergies since your discharge? No  Dietary orders reviewed? No Do you have support at home? Yes   Home Care and Equipment/Supplies: Were home health services ordered? no If so, what is the name of the agency? N/a  Has the agency set up a time to come to the patient's home? not applicable Were any new equipment or medical supplies ordered?  No What is the name of the medical supply agency? N/a Were you able to get the supplies/equipment? not applicable Do you have any questions related to the use of the equipment or supplies? N/a  Functional Questionnaire: (I = Independent and D = Dependent) ADLs: I  Bathing/Dressing- I  Meal Prep- I  Eating- I  Maintaining continence- I  Transferring/Ambulation- I  Managing Meds- I  Follow up appointments reviewed:  PCP Hospital f/u appt confirmed? Yes  Scheduled to see Dr. Etter Sjogren on 08/30/21 @ 2:20. [place] [facility] f/u appt confirmed? No  Patient waiting to hear back from Pulmonology Are transportation arrangements needed? No  If their condition worsens, is the pt aware to call PCP or go to the Emergency Dept.? Yes Was the patient provided with contact information for the PCP's office or ED? Yes Was to pt encouraged to call back with questions or concerns? Yes

## 2021-08-29 ENCOUNTER — Ambulatory Visit (HOSPITAL_COMMUNITY)
Admission: RE | Admit: 2021-08-29 | Discharge: 2021-08-29 | Disposition: A | Discharge status: Home / Self Care | Payer: Medicare Other | Source: Ambulatory Visit | Attending: Gastroenterology | Admitting: Gastroenterology

## 2021-08-29 ENCOUNTER — Other Ambulatory Visit: Payer: Self-pay

## 2021-08-29 DIAGNOSIS — R1310 Dysphagia, unspecified: Secondary | ICD-10-CM | POA: Insufficient documentation

## 2021-08-29 DIAGNOSIS — K224 Dyskinesia of esophagus: Secondary | ICD-10-CM | POA: Diagnosis not present

## 2021-08-29 MED ORDER — IOHEXOL 300 MG/ML  SOLN
100.0000 mL | Freq: Once | INTRAMUSCULAR | Status: AC | PRN
  Administered 2021-08-29: 100 mL via ORAL

## 2021-08-30 ENCOUNTER — Ambulatory Visit (INDEPENDENT_AMBULATORY_CARE_PROVIDER_SITE_OTHER): Payer: Medicare Other | Admitting: Pharmacist

## 2021-08-30 ENCOUNTER — Encounter: Payer: Self-pay | Admitting: Family Medicine

## 2021-08-30 ENCOUNTER — Ambulatory Visit (INDEPENDENT_AMBULATORY_CARE_PROVIDER_SITE_OTHER): Payer: Medicare Other | Admitting: Family Medicine

## 2021-08-30 VITALS — BP 128/80 | HR 76 | Temp 97.7°F | Resp 18 | Ht 69.0 in | Wt 215.2 lb

## 2021-08-30 DIAGNOSIS — Z23 Encounter for immunization: Secondary | ICD-10-CM | POA: Diagnosis not present

## 2021-08-30 DIAGNOSIS — I10 Essential (primary) hypertension: Secondary | ICD-10-CM

## 2021-08-30 DIAGNOSIS — A419 Sepsis, unspecified organism: Secondary | ICD-10-CM | POA: Diagnosis not present

## 2021-08-30 DIAGNOSIS — E1165 Type 2 diabetes mellitus with hyperglycemia: Secondary | ICD-10-CM

## 2021-08-30 NOTE — Chronic Care Management (AMB) (Signed)
Chronic Care Management Pharmacy Note  08/30/2021 Name:  Garrett [name] MRN:  [id number] DOB:  Apr 16, 1951  Subjective: Garrett [name] is an 70 y.o. year old male who is a primary patient of Ann Held, DO.  The CCM team was consulted for assistance with disease management and care coordination needs.    Engaged with patient by telephone for follow up visit in response to provider referral for pharmacy case management and/or care coordination services.   Consent to Services:  The patient was given information about Chronic Care Management services, agreed to services, and gave verbal consent prior to initiation of services.  Please see initial visit note for detailed documentation.   Patient Care Team: Carollee Herter, Alferd Apa, DO as PCP - General (Family Medicine) Milus Banister, MD (Gastroenterology) [name], MD (Neurosurgery) Calvert Cantor, MD as Consulting Physician (Ophthalmology) Tanda Rockers, MD as Consulting Physician (Pulmonary Disease) Cameron Sprang, MD as Consulting Physician (Neurology) [name], MD as Consulting Physician (Neurosurgery) Marlaine Hind, MD as Consulting Physician (Physical Medicine and Rehabilitation) Cherre Robins, PharmD (Pharmacist) Ralene Ok, MD as Consulting Physician (General Surgery)  Recent office visits: 05/11/21 PCP (Dr. Carollee Herter)- Annual visit. Start farxiga 5 mg.  Recent consult visits: 07/05/2021 GI Berniece Pap, NP) Seen for dysphagia. Stopped pantoprazole due to patient preference.  06/27/21 - Audiology (Dr Rachell Cipro) Replace left hearing aid; 06/27/21 Neuro (Dr Delice Lesch) neuropathy and migraines. No med changes - Referral to PT for right foot drop. F/U 1 year. 06/15/21 - Phone Call - PA for atrovastatin 40mg  - take 2 tablets daily. quantity limit and per insurance. Changed to 80mg  daily. 06/06/21 Podiatry (Dr Prudence Davidson) - Diabetic foot care 04/20/21 Vascular Surgery (Dr Lamonte Sakai) 03/02/21 Podiatry (Dr Posey Pronto)  - Follow up diabetic foot care.  Hospital visits: Performed medication review today to reconcile discharge medications and medication list in Epic.   08/14/2021 to 08/20/2021 Hospital Admission to [facility] for severe sepsis due to left sided pneumonia.  Medications Started at Discharge: Augmentin 875mg  - take 1 tablet every 12 hours for 4 days.  guaiFENesin-dextromethorphan (ROBITUSSIN DM) syrup - Take 5 mLs by mouth every 4 (four) hours as needed for cough magic mouthwash -Take 5 mLs by mouth 3 (three) times daily as needed for up to 5 days for mouth pain.,  Medications Stopped at Discharge - None Medications changeds at Discharge - None  08/14/2021 - GI (Dr [name]) Colonoscopy and EGD. Noted recurrent hiatal hernia. Dilated GE gunction. Two polyps removed. Noted to have diverticula. 02/20/2021 - to 02/27/2021 Hospital admission for hital hernia. No medication changes noted at discharge  Objective:  Lab Results  Component Value Date   CREATININE 1.11 08/19/2021   CREATININE 1.03 08/18/2021   CREATININE 1.04 08/17/2021    Lab Results  Component Value Date   HGBA1C 7.5 (H) 08/17/2021   Last diabetic Eye exam:  Lab Results  Component Value Date/Time   HMDIABEYEEXA No Retinopathy 12/21/2018 12:34 PM    Last diabetic Foot exam: No results found for: HMDIABFOOTEX      Component Value Date/Time   CHOL 152 05/11/2021 0955   CHOL 162 07/18/2015 0744   TRIG 131.0 05/11/2021 0955   TRIG 110 07/18/2015 0744   HDL 41.30 05/11/2021 0955   HDL 41 07/18/2015 0744   CHOLHDL 4 05/11/2021 0955   VLDL 26.2 05/11/2021 0955   LDLCALC 84 05/11/2021 0955   LDLCALC 129 (H) 08/17/2020 0948   LDLCALC 99 07/18/2015 0744  LDLDIRECT 104.0 02/14/2020 1025    Hepatic Function Latest Ref Rng & Units 08/15/2021 08/14/2021 05/11/2021  Total Protein 6.5 - 8.1 g/dL 5.5(L) 6.2(L) 7.1  Albumin 3.5 - 5.0 g/dL 2.9(L) 3.3(L) 4.3  AST 15 - 41 U/L 28 51(H) 26  ALT 0 - 44 U/L 39 52(H) 36  Alk  Phosphatase 38 - 126 U/L 39 44 54  Total Bilirubin 0.3 - 1.2 mg/dL 0.8 0.7 0.4  Bilirubin, Direct 0.0 - 0.3 mg/dL - - -    Lab Results  Component Value Date/Time   TSH 1.30 04/28/2018 10:16 AM   TSH 1.24 02/03/2017 10:50 AM    CBC Latest Ref Rng & Units 08/17/2021 08/16/2021 08/15/2021  WBC 4.0 - 10.5 K/uL 10.0 12.4(H) -  Hemoglobin 13.0 - 17.0 g/dL 10.4(L) 11.1(L) 10.5(L)  Hematocrit 39.0 - 52.0 % 33.3(L) 34.6(L) 31.0(L)  Platelets 150 - 400 K/uL 229 224 -    No results found for: VD25OH  Clinical ASCVD: Yes  The 10-year ASCVD risk score (Arnett DK, et al., 2019) is: 39%   Values used to calculate the score:     Age: 14 years     Sex: Male     Is Non-Hispanic African American: No     Diabetic: Yes     Tobacco smoker: No     Systolic Blood Pressure: 144 mmHg     Is BP treated: Yes     HDL Cholesterol: 41.3 mg/dL     Total Cholesterol: 152 mg/dL      Social History   Tobacco Use  Smoking Status Former   Packs/day: 3.00   Years: 30.00   Pack years: 90.00   Types: Cigarettes   Quit date: 01/09/1991   Years since quitting: 30.6  Smokeless Tobacco Former   Types: Snuff   BP Readings from Last 3 Encounters:  08/20/21 133/81  08/14/21 128/75  07/18/21 123/85   Pulse Readings from Last 3 Encounters:  08/20/21 67  08/14/21 66  07/18/21 70   Wt Readings from Last 3 Encounters:  08/15/21 228 lb 6.3 oz (103.6 kg)  08/14/21 224 lb (101.6 kg)  07/05/21 224 lb 12.8 oz (102 kg)    Assessment: Review of patient past medical history, allergies, medications, health status, including review of consultants reports, laboratory and other test data, was performed as part of comprehensive evaluation and provision of chronic care management services.   SDOH:  (Social Determinants of Health) assessments and interventions performed:  SDOH Interventions    Flowsheet Row Most Recent Value  SDOH Interventions   Financial Strain Interventions Other (Comment)  [completed PAP  application today. patient to sign when in office later today.]        CCM Care Plan  Allergies  Allergen Reactions   Bee Venom Anaphylaxis   Garlic Swelling    Medications Reviewed Today     Reviewed by Cherre Robins, PharmD (Pharmacist) on 08/30/21 at 1009  Med List Status: <None>   Medication Order Taking? Sig Documenting Provider Last Dose Status Informant  albuterol (PROVENTIL) (2.5 MG/3ML) 0.083% nebulizer solution [id number] Yes Take 3 mLs (2.5 mg total) by nebulization every 6 (six) hours as needed for wheezing or shortness of breath. [name] R, DO Taking Active Self  allopurinol (ZYLOPRIM) 100 MG tablet [id number] Yes TAKE 1 TABLET BY MOUTH  DAILY Carollee Herter, Alferd Apa, DO Taking Active   atorvastatin (LIPITOR) 80 MG tablet [id number] Yes Take 80 mg by mouth every evening. [provider] Taking Active Self  budesonide-formoterol (SYMBICORT) 80-4.5 MCG/ACT inhaler [id number] Yes Inhale 2 puffs into the lungs daily. [provider] Taking Active Self  celecoxib (CELEBREX) 200 MG capsule [id number] Yes TAKE 1 CAPSULE BY MOUTH  TWICE DAILY Carollee Herter, Alferd Apa, DO Taking Active   dapagliflozin propanediol (FARXIGA) 10 MG TABS tablet [id number] Yes Take 1 tablet (10 mg total) by mouth daily before breakfast. Carollee Herter, Alferd Apa, DO Taking Active Self  EPINEPHrine 0.3 mg/0.3 mL IJ SOAJ injection [id number] Yes Inject 0.3 mg into the muscle as needed for anaphylaxis.  [provider] Taking Active Self  famotidine (PEPCID) 20 MG tablet [id number] Yes Take 1 tablet (20 mg total) by mouth at bedtime. Milus Banister, MD Taking Active Self  fenofibrate 160 MG tablet [id number] Yes TAKE 1 TABLET BY MOUTH  DAILY  Patient taking differently: Take 160 mg by mouth at bedtime.   [name] R, DO Taking Active   fluticasone (FLONASE) 50 MCG/ACT nasal spray [id number] Yes Place 2 sprays into both nostrils daily. Ann Held, DO Taking Active  Self  furosemide (LASIX) 40 MG tablet [id number] Yes Take 40 mg by mouth See admin instructions. 40mg  in the morning  20 mg in evening [provider] Taking Active Self  gabapentin (NEURONTIN) 100 MG capsule [id number] Yes TAKE 2 CAPSULES BY MOUTH AT BEDTIME Lyndal Pulley, DO Taking Active   levocetirizine (XYZAL) 5 MG tablet [id number] Yes Take 1 tablet (5 mg total) by mouth every evening. Ann Held, DO Taking Active Self  magnesium oxide (MAG-OX) 400 (241.3 Mg) MG tablet [id number] Yes Take 1 tablet (400 mg total) by mouth daily. Reyne Dumas, MD Taking Active Self  meclizine (ANTIVERT) 25 MG tablet [id number] Yes Take 1 tablet (25 mg total) by mouth 3 (three) times daily as needed for dizziness. Carollee Herter, Kendrick Fries R, DO Taking Active   memantine (NAMENDA) 10 MG tablet [id number] Yes TAKE 2 TABLETS BY MOUTH  DAILY Carollee Herter, Alferd Apa, DO Taking Active   metFORMIN (GLUCOPHAGE) 500 MG tablet [id number] Yes TAKE 1 TABLET BY MOUTH  TWICE DAILY WITH A MEAL [name] R, DO Taking Active   metoprolol tartrate (LOPRESSOR) 50 MG tablet [id number] Yes Take 50 mg by mouth 2 (two) times daily. [provider] Taking Active Self  Multiple Vitamins-Minerals (ONE-A-DAY WEIGHT SMART ADVANCE PO) [id number] Yes Take 1 tablet by mouth daily. Centrum Silver [provider] Taking Active Self  nitroGLYCERIN (NITROSTAT) 0.4 MG SL tablet [id number] Yes Place 0.4 mg under the tongue every 5 (five) minutes as needed for chest pain. [provider] Taking Active Self  omeprazole (PRILOSEC) 40 MG capsule [id number] Yes Take 40 mg by mouth 2 (two) times daily. [provider] Taking Active Self  Donald Siva test strip [id number] Yes USE AS DIRECTED THREE TIMES DAILY. Ann Held, DO Taking Active Self  potassium chloride SA (KLOR-CON) 20 MEQ tablet [id number] Yes TAKE 2 TABLETS BY MOUTH  DAILY Ann Held, DO Taking Active   PROAIR HFA 108 [phone]  Base) MCG/ACT inhaler [id number] Yes Inhale 1 puff into the lungs every 6 (six) hours as needed for wheezing or shortness of breath.  Patient taking differently: Inhale 1-2 puffs into the lungs every 6 (six) hours as needed for wheezing or shortness of breath.   [name] R, DO Taking Active   SUMAtriptan (IMITREX) 50 MG tablet [id number] Yes Take 1 tablet as needed for migraine/vertigo. Do not take more than  3 a week  Patient taking differently: Take 50 mg by mouth every 2 (two) hours as needed for migraine. Max 3 tabs per week   Carollee Herter, Alferd Apa, DO Taking Active   tadalafil (CIALIS) 20 MG tablet [id number] Yes Take 0.5-1 tablets (10-20 mg total) by mouth every other day as needed for erectile dysfunction. Ann Held, DO Taking Active Self  topiramate (TOPAMAX) 50 MG tablet [id number] Yes Take 1 tablet (50 mg total) by mouth 2 (two) times daily. Cameron Sprang, MD Taking Active Self  verapamil (CALAN-SR) 240 MG CR tablet [id number] Yes TAKE 1 TABLET BY MOUTH AT  BEDTIME Ann Held, DO Taking Active   vitamin B-12 (CYANOCOBALAMIN) 1000 MCG tablet [id number] Yes Take 1,000 mcg by mouth daily. [provider] Taking Active Self  XARELTO 20 MG TABS tablet [id number] Yes TAKE 1 TABLET BY MOUTH  DAILY WITH SUPPER Tanda Rockers, MD Taking Active             Patient Active Problem List   Diagnosis Date Noted   Severe sepsis ([place]) 08/14/2021   Pneumonia 08/14/2021   Lactic acidosis 08/14/2021   Hypokalemia 08/14/2021   DOE (dyspnea on exertion) 05/11/2021   Abnormal stress test 04/20/2021   Acute respiratory distress    Acute respiratory failure with hypoxia (HCC)    S/P Nissen fundoplication (without gastrostomy tube) procedure 02/20/2021   Body mass index (BMI) 33.0-33.9, adult 11/03/2019   Laceration of right hand 08/02/2019   Acute pulmonary embolism ([place]) 01/07/2019   Chronic diastolic CHF (congestive heart failure) ([place]) 01/07/2019    Preventative health care 02/10/2018   Dizziness 02/10/2018   Spondylolisthesis at L3-L4 level 10/28/2017   Cough variant asthma  vs uacs/ pseudoasthma 06/17/2017   Pneumonia of right middle lobe due to infectious organism 02/28/2017   Pulmonary embolism and infarction ([place]) 02/09/2017   Bilateral pulmonary embolism ([place]) 02/04/2017   Greater trochanteric bursitis of left hip 01/14/2017   Greater trochanteric bursitis of right hip 12/20/2016   Degenerative arthritis of knee, bilateral 10/08/2016   Upper airway cough syndrome 03/22/2016   Multiple pulmonary nodules 03/22/2016   Acute bronchitis 01/22/2016   Headache disorder 01/04/2016   Acute upper respiratory infection 10/25/2015   Elevated CK 07/18/2015   History of colonic polyps 04/11/2015   Lumbar stenosis with neurogenic claudication 11/14/2014   Spondylolysis of cervical region 02/25/2014   Lumbosacral spondylosis without myelopathy 01/06/2014   OSA (obstructive sleep apnea) 12/08/2013   SOB (shortness of breath) on exertion 11/04/2013   Morbid obesity due to excess calories ([place])    Cervicalgia    Venous insufficiency of leg 02/18/2012   Itching 02/18/2012   Mild dementia ([place]) 05/28/2011   Hyperlipidemia associated with type 2 diabetes mellitus ([place]) 05/27/2011   Controlled type 2 diabetes mellitus with diabetic nephropathy ([place]) 04/10/2011   Gout 04/10/2011   Essential hypertension 04/10/2011   OA (osteoarthritis) 04/10/2011   GERD (gastroesophageal reflux disease) 04/10/2011   Migraine headache without aura 04/10/2011   Allergic rhinitis, cause unspecified 04/10/2011   Bee sting allergy 04/10/2011    Immunization History  Administered Date(s) Administered   Fluad Quad(high Dose 65+) 10/07/2019, 10/19/2020   Influenza Split 09/30/2012   Influenza, High Dose Seasonal PF 09/19/2016, 10/07/2017, 10/16/2018   Influenza,inj,Quad PF,6+ Mos 09/15/2013, 09/26/2014, 09/22/2015   PFIZER(Purple Top)SARS-COV-2 Vaccination  02/12/2020, 03/07/2020, 08/19/2020   Pneumococcal Conjugate-13 07/18/2015   Pneumococcal Polysaccharide-23 04/10/2011, 09/19/2016   Tdap 04/10/2011, 04/09/2019, 07/22/2019   Zoster, Live  03/28/2014    Conditions to be addressed/monitored: CHF, CAD, HTN, HLD, Hypertriglyceridemia, DMII, and Asthma, neuropathy, migraine headaches, GERD, gout, mild anemia, OSA; recurrent PE with chronic anticoagulation  Care Plan : General Pharmacy (Adult)  Updates made by Cherre Robins, PHARMD since 08/30/2021 12:00 AM     Problem: astham, hyperlipidemia; type 2 DM; CAD; CHF; HTN; recurrent PE; chronic anticoagulation; migraines; neuropathy; gout; GERD; mild dementia; vertigo; OSA; seasonal allergies   Priority: High  Onset Date: 08/07/2021  Note:   Current Barriers:  Unable to maintain control of type 2 DM Does not adhere to prescribed medication regimen Education needed regarding medication regimen and chronic conditions  Pharmacist Clinical Goal(s):  Over the next 90 days, patient will achieve adherence to monitoring guidelines and medication adherence to achieve therapeutic efficacy achieve control of type 2 DM as evidenced by A1c < 7.0 maintain control of HTN, CHF, hyperlipidemia and asthma  as evidenced by attainment of goals listed below  through collaboration with PharmD and provider.   Interventions: 1:1 collaboration with Carollee Herter, Alferd Apa, DO regarding development and update of comprehensive plan of care as evidenced by provider attestation and co-signature Inter-disciplinary care team collaboration (see longitudinal plan of care) Comprehensive medication review performed; medication list updated in electronic medical record    Hypertension / CHF:  BP Readings from Last 3 Encounters:  07/18/21 123/85  07/05/21 124/72  06/27/21 131/79  Controlled; Goal: BP <140/90, minimize symptoms of CHF and prevent CHF exacerbations Current regimen:  Verapamil 240mg  daily at bedtime Metoprolol  tartrate 50mg  twice a day Furosemide 40mg  each morning and 20mg  each evening Potassium chloride 65mEq - take 2 tablets daily Type: Diastolic Last ejection fraction: 03/03/17 EF: 65-70% NYHA Class: II (slight limitation of activity) AHA HF Stage: B (Heart disease present - no symptoms present) Patient has failed these meds in past: None noted  Last BNP was 123 (02/26/2021) Interventions: (addressed at previous visit) Discussed blood pressure goal Updated medication list to reflect increase in metoprolol after last visit with cardiology  Check blood pressure 2-3 time per week, document, and provide at future appointments Ensure daily salt intake < 2300 mg/day  Hyperlipidemia / CAD;  Lab Results  Component Value Date/Time   LDLCALC 84 05/11/2021 09:55 AM   LDLCALC 129 (H) 08/17/2020 09:48 AM   LDLCALC 99 07/18/2015 07:44 AM   LDLDIRECT 104.0 02/14/2020 10:25 AM  Not at goal; LDL goal < 70 Current regimen:  Atorvastatin 80mg  daily (dose increased 05/11/2021) Fenofibrate 160mg  daily Interventions: Discussed LDL goal Maintain cholesterol medication regimen - expect LDL improved with higher atorvastatin dose.   Diabetes Lab Results  Component Value Date/Time   HGBA1C 7.5 (H) 05/11/2021 09:55 AM   HGBA1C 6.8 (H) 02/22/2021 04:03 AM  A1c increasing;  A1c goal <7.0 Checks BG: Daily Recent FBG Readings: 175-200 Denie s/sx of hypoglycemia.  Current regimen:  Metformin 500mg  twice a day with food Farxiga 10mg  daily Ozempic 0.25mg  injected once per week  Previous medications tried: metformin 1000mg  bid - GI side effects; Victoza daily - stopped after weight loss and improvement in A1c.  Due to recent hospitalization patient has only had 1 dose of Ozempic (Sunday 08/26/2021). Reports he tolerated well with no nausea.  Interventions: Discussed A1c goals Continue Ozempic 0.25mg  SQ weekly for 3 more weeks. If tolerating then will increase to 0.5mg  weekly Financial assessment performed  regarding [facility] Patient assistance program. Patient will likely qualify. Started application for Ozempic patient assistance. Patient to see PCP this afternoon  in office and will sign PAP application then.  Reviewed home blood glucose readings and reviewed goals  Fasting blood glucose goal (before meals) = 80 to 130 Blood glucose goal after a meal = less than 180  Patient self care activities - Over the next 90 days, patient will: Check blood sugar once daily, document, and provide at future appointments Start Ozempic 0.$RemoveBeforeD'25mg'ndAbtjbOtitykR$  inject subcutaneously once a week for 4 weeks. (Plan to start Wednesday, August 24th after colonoscopy)  History of Pulmonary embolism (blood clot)  Controlled; Goal: prevent recurrence of blood clots and prevent stroke Current regimen:  Xarelto $Remove'20mg'HWEQBku$  daily with supper Interventions: (addressed at previous visit)  Screened for patient assistance program (unfortunately patient did not qualify this year)  Continue to take Xarelto daily   Asthma: Controlled; Goal: improve breathing and prevent hospitalizations related to asthma exacerbations Current regimen:  Symbicort 80/4.8mcg - inhaler 2 puffs into lungs once a day (during allergy season) Albuterol inhaler or nebulization solution - use as needed for shortness of breath or wheezing Patient has failed these meds in past: Trelegy  Using maintenance inhaler regularly? Yes during allergy season (Spring and Fall)  Frequency of rescue inhaler use: rarely uses current but during allergy season 1 or 2 time per week Managed by pulmonology - Dr. Melvyn Novas Interventions: Discussed maintenance versus rescue therapy.  Discussed seasonal triggers  Continue to follow up with Dr Melvyn Novas  Continue above medication regimen.   Medication management Pharmacist Clinical Goal(s): Over the next 90 days, patient will work with PharmD and providers to maintain optimal medication adherence Current pharmacy: [facility]  Pharmacy Interventions Comprehensive medication review performed. Performed review of medications from recent hospitalization and discharge summary. Patient has completed antibiotic therapy and is not longer in need of Robitussin or Magic Mouthwash prescribed at discharge. Removed from medication list.  Continue current medication management strategy Focus on medication adherence by filling and taking medications appropriately  Take medications as prescribed Report any questions or concerns to PharmD and/or provider(s)  Patient Goals/Self-Care Activities Over the next 90 days, patient will:  take medications as prescribed,  check glucose daily, document, and provide at future appointments,  collaborate with provider on medication access solutions.  Follow Up Plan: Telephone follow up appointment with care management team member scheduled for:  CMA to check on PAP application and BLOOD GLUCOSE in 2 weeks. Pharmacist will check up in 4 to 6 weeks.       Medication Assistance: Patient is in Medicare coverage gap and cost of Symbicort, Wilder Glade and Xarelto has increased.Patient states assistance would be helpful but he can afford to continue these medications even if he dose not qualify. During 08/08/2021 phone visit performed review of patient assistance program qualifications and unfortunately patient does not currently qualify for patient assistance of the above mentioned medications.  Patient will likely qualify for NovoNordisk PAP for Ozempic - started process today for applying for PAP. Patient will come in today to sign application. Anticipate assistance started date 09/21/2021  Patient's preferred pharmacy is:  [facility], [place] - [address] [address] Phone: [phone] Fax: [phone]  [facility]  ([place], [place] [facility] [address]  [address] Phone: [phone] Fax: [phone]  [facility] ([facility]) - [place], [address] [place] [address]  Phone: [phone] Fax: [phone]  Uses pill box? Yes Pt endorses 99% compliance  Follow Up:  Patient agrees to Care Plan and Follow-up.  Plan: Telephone follow up appointment with care management team member scheduled for:  2 weeks  Cherre Robins, PharmD Clinical Pharmacist [facility]

## 2021-08-30 NOTE — Assessment & Plan Note (Signed)
Improved Recheck labs  F/u prn
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
## 2021-08-30 NOTE — Patient Instructions (Signed)
Visit Information  PATIENT GOALS:  Goals Addressed             This Visit's Progress    Chronic Care Management Pharmacy Care Plan   On track    CARE PLAN ENTRY (see longitudinal plan of care for additional care plan information)  Current Barriers:  Chronic Disease Management support, education, and care coordination needs related to DM, HLD, HF, HTN, Hx of PE, Asthma, GERD, Dementia, Migraine, Allergies, Gout, Pain, Vertigo   Hypertension BP Readings from Last 3 Encounters:  08/20/21 133/81  08/14/21 128/75  07/18/21 123/85  Pharmacist Clinical Goal(s): Over the next 90 days, patient will work with PharmD and providers to maintain BP goal <140/90 Current regimen:  Verapamil '240mg'$  daily at bedtime Metoprolol tartrate '50mg'$  twice a day Interventions: Discussed blood pressure goal Updated medication list to reflect increase in metoprolol after last visit with cardiology  Patient self care activities - Over the next 90 days, patient will: Check blood pressure 2 to 3 time per week, document, and provide at future appointments Ensure daily salt intake < 2300 mg/day  Hyperlipidemia Lab Results  Component Value Date/Time   LDLCALC 84 05/11/2021 09:55 AM   LDLCALC 129 (H) 08/17/2020 09:48 AM   LDLCALC 99 07/18/2015 07:44 AM   LDLDIRECT 104.0 02/14/2020 10:25 AM  Pharmacist Clinical Goal(s): Over the next 90 days, patient will work with PharmD and providers to achieve LDL goal < 70 Current regimen:  Atorvastatin '80mg'$  daily  Fenofibrate '160mg'$  daily Interventions: Discussed LDL goal Patient self care activities - Over the next 90 days, patient will: Maintain cholesterol medication regimen.  Diabetes Lab Results  Component Value Date/Time   HGBA1C 7.5 (H) 08/17/2021 05:29 AM   HGBA1C 7.5 (H) 05/11/2021 09:55 AM  Pharmacist Clinical Goal(s): Over the next 90 days, patient will work with PharmD and providers to achieve A1c goal <7% Current regimen:  Metformin '500mg'$  twice a  day with food Farxiga '10mg'$  daily Ozampic 0.'25mg'$  once per week Interventions: Discussed A1c goals Started application for [employer] patient assistance. Patient to sign when in office later today.  Reviewed home blood glucose readings and reviewed goals  Fasting blood glucose goal (before meals) = 80 to 130 Blood glucose goal after a meal = less than 180  Patient self care activities - Over the next 90 days, patient will: Check blood sugar once daily, document, and provide at future appointments Continue Ozempic 0.'25mg'$  inject subcutaneously once a week for the next 3 weeks. Contact provider with any episodes of hypoglycemia  History of Pulmonary embolism (blood clot)  Pharmacist Clinical Goal(s) Over the next 90 days, patient will work with PharmD and providers to prevent recurrence of blood clots and prevent stroke Current regimen:  Xarelto '20mg'$  daily with supper Interventions: Screened for patient assistance program (unfortunately patient did not qualify this year)  Patient self care activities - Over the next 90 days, patient will: Continue to take Xarelto daily  Asthma: Pharmacist Clinical Goal(s) Over the next 90 days, patient will work with PharmD and providers to improve breathing and prevent hospitalizations related to asthma exacerbations Current regimen:  Symbicort 80/4.75mg - inhaler 2 puffs into lungs once a day Albuterol inhaler or nebulization solution - use as needed for shortness of breath or wheezing Interventions: Discussed maintenance versus rescue therapy.  Discussed seasonal as the triggers  Patient self care activities - Over the next 90 days, patient will: Continue to follow up with Dr WMelvyn Novas Continue above medication regimen.  Medication management Pharmacist Clinical Goal(s): Over the next 90 days, patient will work with PharmD and providers to maintain optimal medication adherence Current pharmacy: [facility]  Pharmacy Interventions Comprehensive medication review performed. Performed review of medications from recent hospitalization and discharge summary.  Continue current medication management strategy Patient self care activities - Over the next 90 days, patient will: Focus on medication adherence by filling and taking medications appropriately  Take medications as prescribed Report any questions or concerns to PharmD and/or provider(s)  Please see past updates related to this goal by clicking on the "Past Updates" button in the selected goal          The patient verbalized understanding of instructions, educational materials, and care plan provided today and declined offer to receive copy of patient instructions, educational materials, and care plan.   Telephone follow up appointment with care management team member scheduled for: 2 weeks - CMA will check on BLOOD GLUCOSE and PAP application; follow up with pharmacist by phone in 4 to 6 weeks   Cherre Robins, PharmD Clinical Pharmacist [facility]

## 2021-08-30 NOTE — Patient Instructions (Signed)
Aspiration Pneumonia, Adult Aspiration pneumonia is an infection that occurs after lung (pulmonary) aspiration. Pulmonary aspiration is when you inhale a large amount of food, liquid, stomach acid, or saliva into the lungs. This can cause inflammation and infection in the lungs. This can make you cough and make it hard to breathe. Aspiration pneumonia is a serious condition and can be life-threatening. What are the causes? This condition may be caused by: Bacteria in food, liquid, stomach acid, or saliva that is inhaled into the lung. Irritation and inflammation that results from material, such as blood or a foreign body, being inhaled into the lung. This can lead to an infection even though the material is not originally contaminated with bacteria. What increases the risk? You are more likely to get aspiration pneumonia if you have a condition that makes it hard to breathe, swallow, cough, or gag. These conditions may include: A breathing disorder, such as chronic obstructive pulmonary disease, that makes it hard to eat or drink while breathing. A brain (neurologic) disorder, such as stroke, seizures, Parkinson's disease, dementia, amyotrophic lateral sclerosis (ALS), or brain injury. Having gastroesophageal reflux disease (GERD). Having a weak disease-fighting system (immune system). Having a narrowing of the tube that carries food to the stomach (esophageal narrowing). Other factors that may make you more likely to get aspiration pneumonia include: Being older than age 60 and frail. Being given a general anesthetic for procedures. Drinking too much alcohol and passing out. If you pass out and vomit, then vomit can be inhaled into your lungs. Taking certain medicines, such as tranquilizers or sedatives. Taking poor care of your mouth and teeth. Being malnourished. What are the signs or symptoms? The main symptom of pulmonary aspiration may be an episode of choking or coughing while eating or  drinking. When aspiration pneumonia develops, symptoms include: Persistent cough. Difficulty breathing, such as wheezing or shortness of breath. Fever. Chest pain. Being more tired than usual (fatigue). Pulmonary aspiration may be silent, meaning that it is not associated with coughing or choking while eating or drinking. How is this diagnosed? This condition may be diagnosed based on: A physical exam. Tests, such as: A chest X-ray. A sputum culture. Saliva and mucus (sputum) are collected from the lungs or the tubes that carry air to the lungs (bronchi). The sputum is then tested for bacteria. Oximetry. A sensor or clip is placed on areas such as a finger, earlobe, or toe to measure the oxygen level in your blood. Blood tests. A swallowing study. This test looks at how food is swallowed and whether it goes into your windpipe (trachea) or esophagus. A bronchoscopy. This test uses a flexible tube (bronchoscope) to see inside the lungs. How is this treated? This condition may be treated with: Medicines. Antibiotic medicine will be given to kill the pneumonia bacteria. Other medicines may also be used to reduce fever, pain, or inflammation. Breathing assistance and oxygen therapy. Depending on how well you are breathing, you may need to be given oxygen, or you may need breathing support from a breathing machine (ventilator). Thoracentesis. This is a procedure to remove fluid that has built up in the space between the linings of the chest wall and the lungs. Dietary changes. You may need to avoid certain food textures or liquids. For people who have recurrent aspiration pneumonia, a feeding tube might be placed in the stomach for nutrition. Follow these instructions at home: Medicines  Take over-the-counter and prescription medicines only as told by your health care provider.   If you were prescribed an antibiotic medicine, take it as told by your health care provider. Do not stop taking the  antibiotic even if you start to feel better. Take cough medicine only if you are losing sleep. Cough medicine can prevent your body's natural ability to remove mucus from your lungs. General instructions  Carefully follow any eating instructions you were given, such as avoiding certain food textures or thickening your liquids. Thickening liquids reduces the risk of developing aspiration pneumonia again. Return to normal activities as told by your health care provider. Ask your health care provider what activities are safe for you. Sleep in a semi-upright position at night. Try to sleep in a reclining chair, or place a few pillows under your head in bed. Do not use any products that contain nicotine or tobacco, such as cigarettes, e-cigarettes, and chewing tobacco. If you need help quitting, ask your health care provider. Keep all follow-up visits as told by your health care provider. This is important. Contact a health care provider if you: Have a fever. Are coughing or choking while eating or drinking. Continue to have signs or symptoms of aspiration pneumonia. Get help right away if you have: Worsening shortness of breath, wheezing, or difficulty breathing. Chest pain. Summary Aspiration pneumonia is an infection that occurs after lung (pulmonary) aspiration. Pulmonary aspiration is when you inhale a large amount of food, liquid, stomach acid, or saliva into the lungs. The main symptom of pulmonary aspiration may be an episode of choking or coughing. It may also be silent without coughing or choking. You are more likely to get aspiration pneumonia if you have a condition that makes it hard to breathe, swallow, cough, or gag. This information is not intended to replace advice given to you by your health care provider. Make sure you discuss any questions you have with your health care provider. Document Revised: 12/10/2019 Document Reviewed: 12/10/2019 Elsevier Patient Education  2022 Elsevier  Inc.  

## 2021-08-31 LAB — CBC WITH DIFFERENTIAL/PLATELET
Basophils Absolute: 0.1 10*3/uL (ref 0.0–0.1)
Basophils Relative: 1.3 % (ref 0.0–3.0)
Eosinophils Absolute: 0.1 10*3/uL (ref 0.0–0.7)
Eosinophils Relative: 1.7 % (ref 0.0–5.0)
HCT: 39.8 % (ref 39.0–52.0)
Hemoglobin: 12.8 g/dL — ABNORMAL LOW (ref 13.0–17.0)
Lymphocytes Relative: 24.6 % (ref 12.0–46.0)
Lymphs Abs: 1.7 10*3/uL (ref 0.7–4.0)
MCHC: 32.2 g/dL (ref 30.0–36.0)
MCV: 86.4 fl (ref 78.0–100.0)
Monocytes Absolute: 0.6 10*3/uL (ref 0.1–1.0)
Monocytes Relative: 8 % (ref 3.0–12.0)
Neutro Abs: 4.5 10*3/uL (ref 1.4–7.7)
Neutrophils Relative %: 64.4 % (ref 43.0–77.0)
Platelets: 362 10*3/uL (ref 150.0–400.0)
RBC: 4.6 Mil/uL (ref 4.22–5.81)
RDW: 17.3 % — ABNORMAL HIGH (ref 11.5–15.5)
WBC: 7 10*3/uL (ref 4.0–10.5)

## 2021-08-31 LAB — COMPREHENSIVE METABOLIC PANEL
ALT: 43 U/L (ref 0–53)
AST: 29 U/L (ref 0–37)
Albumin: 4.1 g/dL (ref 3.5–5.2)
Alkaline Phosphatase: 60 U/L (ref 39–117)
BUN: 19 mg/dL (ref 6–23)
CO2: 25 mEq/L (ref 19–32)
Calcium: 9.6 mg/dL (ref 8.4–10.5)
Chloride: 104 mEq/L (ref 96–112)
Creatinine, Ser: 1.41 mg/dL (ref 0.40–1.50)
GFR: 50.18 mL/min — ABNORMAL LOW (ref >60.00)
Glucose, Bld: 120 mg/dL — ABNORMAL HIGH (ref 70–99)
Potassium: 3.5 mEq/L (ref 3.5–5.1)
Sodium: 141 mEq/L (ref 135–145)
Total Bilirubin: 0.6 mg/dL (ref 0.2–1.2)
Total Protein: 7.2 g/dL (ref 6.0–8.3)

## 2021-09-06 ENCOUNTER — Telehealth: Payer: Self-pay | Admitting: Pharmacist

## 2021-09-06 NOTE — Telephone Encounter (Cosign Needed)
Chronic Care Management Pharmacy Note  09/06/2021 Name:  Garrett [name] MRN:  [id number] DOB:  1951-06-21  Subjective: Garrett [name] is an 70 y.o. year old male who is a primary patient of Ann Held, DO.  The CCM team was consulted for assistance with disease management and care coordination needs.    Collaboration with medication assistance program and patient  for  assistance with Ozempic  in response to provider referral for pharmacy case management and/or care coordination services.   Consent to Services:  The patient was given information about Chronic Care Management services, agreed to services, and gave verbal consent prior to initiation of services.  Please see initial visit note for detailed documentation.   Patient Care Team: Carollee Herter, Alferd Apa, DO as PCP - General (Family Medicine) Milus Banister, MD (Gastroenterology) [name], MD (Neurosurgery) Calvert Cantor, MD as Consulting Physician (Ophthalmology) Tanda Rockers, MD as Consulting Physician (Pulmonary Disease) Cameron Sprang, MD as Consulting Physician (Neurology) [name], MD as Consulting Physician (Neurosurgery) Marlaine Hind, MD as Consulting Physician (Physical Medicine and Rehabilitation) Cherre Robins, PharmD (Pharmacist) Ralene Ok, MD as Consulting Physician (General Surgery)  Recent office visits: 08/30/2021 - PCP (Dr Carollee Herter) hospital follow up sepsis. No medication changes noted. Received flu vaccine.  05/11/21 PCP (Dr. Carollee Herter)- Annual visit. Started farxiga 5 mg.  Recent consult visits: 07/05/2021 GI Berniece Pap, NP) Seen for dysphagia. Stopped pantoprazole due to patient preference.  06/27/21 - Audiology (Dr Rachell Cipro) Replace left hearing aid; 06/27/21 Neuro (Dr Delice Lesch) neuropathy and migraines. No med changes - Referral to PT for right foot drop. F/U 1 year. 06/15/21 - Phone Call - PA for atrovastatin 93m - take 2 tablets daily. quantity limit and  per insurance. Changed to 863mdaily. 06/06/21 Podiatry (Dr MaPrudence Davidson- Diabetic foot care 04/20/21 Vascular Surgery (Dr ByLamonte Sakai Hospital visits: 08/14/2021 to 08/20/2021 [facility]  Medications Started at Discharge: Augmentin 87542m take 1 tablet every 12 hours for 4 days.  guaiFENesin-dextromethorphan (ROBITUSSIN DM) syrup - Take 5 mLs by mouth every 4 (four) hours as needed for cough magic mouthwash -Take 5 mLs by mouth 3 (three) times daily as needed for up to 5 days for mouth pain.,  Medications Stopped at Discharge - None Medications changeds at Discharge - None   08/14/2021 - GI (Dr JacArdis Hughsolonoscopy and EGD. Noted recurrent hiatal hernia. Dilated GE gunction. Two polyps removed. Noted to have diverticula. 02/20/2021 - to 02/27/2021 Hospital admission for hital hernia. No medication changes noted at discharge  Objective:  Lab Results  Component Value Date   CREATININE 1.41 08/30/2021   CREATININE 1.11 08/19/2021   CREATININE 1.03 08/18/2021    Lab Results  Component Value Date   HGBA1C 7.5 (H) 08/17/2021   Last diabetic Eye exam:  Lab Results  Component Value Date/Time   HMDIABEYEEXA No Retinopathy 12/21/2018 12:34 PM    Last diabetic Foot exam: No results found for: HMDIABFOOTEX      Component Value Date/Time   CHOL 152 05/11/2021 0955   CHOL 162 07/18/2015 0744   TRIG 131.0 05/11/2021 0955   TRIG 110 07/18/2015 0744   HDL 41.30 05/11/2021 0955   HDL 41 07/18/2015 0744   CHOLHDL 4 05/11/2021 0955   VLDL 26.2 05/11/2021 0955   LDLCALC 84 05/11/2021 0955   LDLCALC 129 (H) 08/17/2020 0948   LDLCALC 99 07/18/2015 0744   LDLDIRECT 104.0  02/14/2020 1025    Hepatic Function Latest Ref Rng & Units 08/30/2021 08/15/2021 08/14/2021  Total Protein 6.0 - 8.3 g/dL 7.2 5.5(L) 6.2(L)  Albumin 3.5 - 5.2 g/dL 4.1 2.9(L) 3.3(L)  AST 0 - 37 U/L 29 28 51(H)  ALT 0 - 53 U/L 43 39 52(H)  Alk  Phosphatase 39 - 117 U/L 60 39 44  Total Bilirubin 0.2 - 1.2 mg/dL 0.6 0.8 0.7  Bilirubin, Direct 0.0 - 0.3 mg/dL - - -    Lab Results  Component Value Date/Time   TSH 1.30 04/28/2018 10:16 AM   TSH 1.24 02/03/2017 10:50 AM    CBC Latest Ref Rng & Units 08/30/2021 08/17/2021 08/16/2021  WBC 4.0 - 10.5 K/uL 7.0 10.0 12.4(H)  Hemoglobin 13.0 - 17.0 g/dL 12.8(L) 10.4(L) 11.1(L)  Hematocrit 39.0 - 52.0 % 39.8 33.3(L) 34.6(L)  Platelets 150.0 - 400.0 K/uL 362.0 229 224    No results found for: VD25OH  Clinical ASCVD: Yes  The 10-year ASCVD risk score (Arnett DK, et al., 2019) is: 37%   Values used to calculate the score:     Age: 70 years     Sex: Male     Is Non-Hispanic African American: No     Diabetic: Yes     Tobacco smoker: No     Systolic Blood Pressure: 128 mmHg     Is BP treated: Yes     HDL Cholesterol: 41.3 mg/dL     Total Cholesterol: 152 mg/dL     Social History   Tobacco Use  Smoking Status Former   Packs/day: 3.00   Years: 30.00   Pack years: 90.00   Types: Cigarettes   Quit date: 01/09/1991   Years since quitting: 30.6  Smokeless Tobacco Former   Types: Snuff   BP Readings from Last 3 Encounters:  08/30/21 128/80  08/20/21 133/81  08/14/21 128/75   Pulse Readings from Last 3 Encounters:  08/30/21 76  08/20/21 67  08/14/21 66   Wt Readings from Last 3 Encounters:  08/30/21 215 lb 3.2 oz (97.6 kg)  08/15/21 228 lb 6.3 oz (103.6 kg)  08/14/21 224 lb (101.6 kg)    Assessment: Review of patient past medical history, allergies, medications, health status, including review of consultants reports, laboratory and other test data, was performed as part of comprehensive evaluation and provision of chronic care management services.   SDOH:  (Social Determinants of Health) assessments and interventions performed: None today  CCM Care Plan  Allergies  Allergen Reactions   Bee Venom Anaphylaxis   Garlic Swelling    @[email or web address]@  Patient Active  Problem List   Diagnosis Date Noted   Severe sepsis (HCC) 08/14/2021   Pneumonia 08/14/2021   Lactic acidosis 08/14/2021   Hypokalemia 08/14/2021   DOE (dyspnea on exertion) 05/11/2021   Abnormal stress test 04/20/2021   Acute respiratory distress    Acute respiratory failure with hypoxia (HCC)    S/P Nissen fundoplication (without gastrostomy tube) procedure 02/20/2021   Body mass index (BMI) 33.0-33.9, adult 11/03/2019   Laceration of right hand 08/02/2019   Acute pulmonary embolism (HCC) 01/07/2019   Chronic diastolic CHF (congestive heart failure) (HCC) 01/07/2019   Preventative health care 02/10/2018   Dizziness 02/10/2018   Spondylolisthesis at L3-L4 level 10/28/2017   Cough variant asthma  vs uacs/ pseudoasthma 06/17/2017   Pneumonia of right middle lobe due to infectious organism 02/28/2017   Sepsis (HCC) 02/28/2017   Pulmonary embolism and infarction (HCC) 02/09/2017   Bilateral pulmonary   embolism ([place]) 02/04/2017   Greater trochanteric bursitis of left hip 01/14/2017   Greater trochanteric bursitis of right hip 12/20/2016   Degenerative arthritis of knee, bilateral 10/08/2016   Upper airway cough syndrome 03/22/2016   Multiple pulmonary nodules 03/22/2016   Acute bronchitis 01/22/2016   Headache disorder 01/04/2016   Acute upper respiratory infection 10/25/2015   Elevated CK 07/18/2015   History of colonic polyps 04/11/2015   Lumbar stenosis with neurogenic claudication 11/14/2014   Spondylolysis of cervical region 02/25/2014   Lumbosacral spondylosis without myelopathy 01/06/2014   OSA (obstructive sleep apnea) 12/08/2013   SOB (shortness of breath) on exertion 11/04/2013   Morbid obesity due to excess calories ([place])    Cervicalgia    Venous insufficiency of leg 02/18/2012   Itching 02/18/2012   Mild dementia ([place]) 05/28/2011   Hyperlipidemia associated with type 2 diabetes mellitus ([place]) 05/27/2011   Controlled type 2 diabetes mellitus with diabetic nephropathy  ([place]) 04/10/2011   Gout 04/10/2011   Essential hypertension 04/10/2011   OA (osteoarthritis) 04/10/2011   GERD (gastroesophageal reflux disease) 04/10/2011   Migraine headache without aura 04/10/2011   Allergic rhinitis, cause unspecified 04/10/2011   Bee sting allergy 04/10/2011    Immunization History  Administered Date(s) Administered   Fluad Quad(high Dose 65+) 10/07/2019, 10/19/2020, 08/30/2021   Influenza Split 09/30/2012   Influenza, High Dose Seasonal PF 09/19/2016, 10/07/2017, 10/16/2018   Influenza,inj,Quad PF,6+ Mos 09/15/2013, 09/26/2014, 09/22/2015   PFIZER(Purple Top)SARS-COV-2 Vaccination 02/12/2020, 03/07/2020, 08/19/2020   Pneumococcal Conjugate-13 07/18/2015   Pneumococcal Polysaccharide-23 04/10/2011, 09/19/2016   Tdap 04/10/2011, 04/09/2019, 07/22/2019   Zoster, Live 03/28/2014    Conditions to be addressed/monitored: CHF, CAD, HTN, HLD, Hypertriglyceridemia, DMII, and asthma, neuropathy, migraine headaches, GERD, gout, mild anemia. OSA, recurrent PE with chronic anticoagulation  Problem: asthma, hyperlipidemia; type 2 DM; CAD; CHF; HTN; recurrent PE; chronic anticoagulation; migraines; neuropathy; gout; GERD; mild dementia; vertigo; OSA; seasonal allergies    Priority: High  Onset Date: 08/07/2021  Note:   Current Barriers:  Unable to maintain control of type 2 DM Does not adhere to prescribed medication regimen Education needed regarding medication regimen and chronic conditions   Pharmacist Clinical Goal(s):  Over the next 90 days, patient will achieve adherence to monitoring guidelines and medication adherence to achieve therapeutic efficacy achieve control of type 2 DM as evidenced by A1c < 7.0 maintain control of HTN, CHF, hyperlipidemia and asthma  as evidenced by attainment of goals listed below  through collaboration with PharmD and provider.    Interventions: 1:1 collaboration with Carollee Herter, Alferd Apa, DO regarding development and update of  comprehensive plan of care as evidenced by provider attestation and co-signature Inter-disciplinary care team collaboration (see longitudinal plan of care) Comprehensive medication review performed; medication list updated in electronic medical record     Hypertension / CHF:     BP Readings from Last 3 Encounters:  07/18/21 123/85  07/05/21 124/72  06/27/21 131/79  Controlled; Goal: BP <140/90, minimize symptoms of CHF and prevent CHF exacerbations Current regimen:  Verapamil 252m daily at bedtime Metoprolol tartrate 537mtwice a day Furosemide 4042mach morning and 37m84mch evening Potassium chloride 37mE79mtake 2 tablets daily Type: Diastolic Last ejection fraction: 03/03/17 EF: 65-70% NYHA Class: II (slight limitation of activity) AHA HF Stage: B (Heart disease present - no symptoms present) Patient has failed these meds in past: None noted  Last BNP was 123 (02/26/2021) Interventions: (addressed at previous visit) Discussed blood pressure goal Updated medication list to  reflect increase in metoprolol after last visit with cardiology  Check blood pressure 2-3 time per week, document, and provide at future appointments Ensure daily salt intake < 2300 mg/day   Hyperlipidemia / CAD;       Lab Results  Component Value Date/Time    LDLCALC 84 05/11/2021 09:55 AM    LDLCALC 129 (H) 08/17/2020 09:48 AM    LDLCALC 99 07/18/2015 07:44 AM    LDLDIRECT 104.0 02/14/2020 10:25 AM  Not at goal; LDL goal < 70 Current regimen:  Atorvastatin 80mg daily (dose increased 05/11/2021) Fenofibrate 160mg daily Interventions: Discussed LDL goal Maintain cholesterol medication regimen - expect LDL improved with higher atorvastatin dose.    Diabetes      Lab Results  Component Value Date/Time    HGBA1C 7.5 (H) 05/11/2021 09:55 AM    HGBA1C 6.8 (H) 02/22/2021 04:03 AM  A1c increasing;  A1c goal <7.0 Checks BG: Daily Recent FBG Readings: 175-200 Denie s/sx of hypoglycemia.  Current  regimen:  Metformin 500mg twice a day with food Farxiga 10mg daily Ozempic 0.25mg injected once per week  Previous medications tried: metformin 1000mg bid - GI side effects; Victoza daily - stopped after weight loss and improvement in A1c.  Due to recent hospitalization patient has only had 1 dose of Ozempic (Sunday 08/26/2021). Reports he tolerated well with no nausea.  Interventions: Discussed A1c goals Continue Ozempic 0.25mg SQ weekly for 3 more weeks. If tolerating then will increase to 0.5mg weekly Called novo nordisk PAP - patient has been approved to received Ozempic thru their program until 11/21/2021. Office should received first shipment in 10 to 14 days. Patient notified.  Reviewed home blood glucose readings and reviewed goals  Fasting blood glucose goal (before meals) = 80 to 130 Blood glucose goal after a meal = less than 180  Patient self care activities - Over the next 90 days, patient will: Check blood sugar once daily, document, and provide at future appointments continue Ozempic 0.25mg inject subcutaneously once a week for 4 weeks. (Plan to started 08/30/2021)   History of Pulmonary embolism (blood clot)  Controlled; Goal: prevent recurrence of blood clots and prevent stroke Current regimen:  Xarelto 20mg daily with supper Interventions: (addressed at previous visit)  Screened for patient assistance program (unfortunately patient did not qualify this year)  Continue to take Xarelto daily    Asthma: Controlled; Goal: improve breathing and prevent hospitalizations related to asthma exacerbations Current regimen:  Symbicort 80/4.5mcg - inhaler 2 puffs into lungs once a day (during allergy season) Albuterol inhaler or nebulization solution - use as needed for shortness of breath or wheezing Patient has failed these meds in past: Trelegy  Using maintenance inhaler regularly? Yes during allergy season (Spring and Fall)  Frequency of rescue inhaler use: rarely uses current  but during allergy season 1 or 2 time per week Managed by pulmonology - Dr. Wert Interventions: Discussed maintenance versus rescue therapy.  Discussed seasonal triggers  Continue to follow up with Dr Wert  Continue above medication regimen.    Medication management Pharmacist Clinical Goal(s): Over the next 90 days, patient will work with PharmD and providers to maintain optimal medication adherence Current pharmacy: [facility] Interventions Comprehensive medication review performed. Performed review of medications from recent hospitalization and discharge summary. Patient has completed antibiotic therapy and is not longer in need of Robitussin or Magic Mouthwash prescribed at discharge. Removed from medication list.  Continue current medication management strategy Focus on medication adherence by   filling and taking medications appropriately  Take medications as prescribed Report any questions or concerns to PharmD and/or provider(s)   Patient Goals/Self-Care Activities Over the next 90 days, patient will:  take medications as prescribed,  check glucose daily, document, and provide at future appointments,  collaborate with provider on medication access solutions.   Follow Up Plan: Telephone follow up appointment with care management team member scheduled for:  CMA to check BLOOD GLUCOSE in 2 weeks. Pharmacist will check up in 4 to 6 weeks.           Medication Assistance: Patient is in Medicare coverage gap and cost of Symbicort, Farxiga and Xarelto has increased.Patient states assistance would be helpful but he can afford to continue these medications even if he dose not qualify. During 08/08/2021 phone visit performed review of patient assistance program qualifications and unfortunately patient does not currently qualify for patient assistance of the above mentioned medications.  Patient will likely qualify for NovoNordisk PAP for Ozempic - started process today  for applying for PAP. Patient will come in today to sign application. Anticipate assistance started date 09/21/2021  Medication Assistance:  Ozempic obtained through Novo Nordisk  medication assistance program.  Enrollment ends 11/21/2021  / approval date 09/06/2021 - just verified approval today. Will take 10 to 14 business days for medication to be shipped to office. Patient is aware.   Patient's preferred pharmacy is:  [facility] - [place], [place] - [address] [place] [address] Phone: [phone] Fax: [phone]  [facility]  ([facility] - [place], [place] - [address] [place] [address] Phone: [phone] Fax: [phone]  [facility]) - [place], [place] - [address] [place] [address] Phone: [phone] Fax: [phone]   Follow Up:  Patient agrees to Care Plan and Follow-up.  Plan: Telephone follow up appointment with care management team member scheduled for:  2 to 3 weeks to check on home blood glucose readings - consider dose escalation of Ozempic   , PharmD Clinical Pharmacist [facility]      

## 2021-09-12 ENCOUNTER — Ambulatory Visit (INDEPENDENT_AMBULATORY_CARE_PROVIDER_SITE_OTHER): Payer: Medicare Other | Admitting: Podiatry

## 2021-09-12 ENCOUNTER — Encounter: Payer: Self-pay | Admitting: Podiatry

## 2021-09-12 ENCOUNTER — Other Ambulatory Visit: Payer: Self-pay

## 2021-09-12 ENCOUNTER — Other Ambulatory Visit: Payer: Self-pay | Admitting: Family Medicine

## 2021-09-12 DIAGNOSIS — M79675 Pain in left toe(s): Secondary | ICD-10-CM | POA: Diagnosis not present

## 2021-09-12 DIAGNOSIS — M79674 Pain in right toe(s): Secondary | ICD-10-CM

## 2021-09-12 DIAGNOSIS — E1121 Type 2 diabetes mellitus with diabetic nephropathy: Secondary | ICD-10-CM | POA: Diagnosis not present

## 2021-09-12 DIAGNOSIS — I872 Venous insufficiency (chronic) (peripheral): Secondary | ICD-10-CM | POA: Diagnosis not present

## 2021-09-12 DIAGNOSIS — B351 Tinea unguium: Secondary | ICD-10-CM | POA: Diagnosis not present

## 2021-09-12 NOTE — Progress Notes (Signed)
This patient returns to my office for at risk foot care.  This patient requires this care by a professional since this patient will be at risk due to having diabetes and coagulation defect. This patient is unable to cut nails himself since the patient cannot reach his nails.These nails are painful walking and wearing shoes.  This patient presents for at risk foot care today.  General Appearance  Alert, conversant and in no acute stress.  Vascular  Dorsalis pedis and posterior tibial  pulses are palpable  bilaterally.  Capillary return is within normal limits  bilaterally. Temperature is within normal limits  bilaterally.  Neurologic  Senn-Weinstein monofilament wire test diminished/absent bilaterally. Muscle power within normal limits bilaterally.  Nails Thick disfigured discolored nails with subungual debris  from hallux to fifth toes bilaterally. No evidence of bacterial infection or drainage bilaterally.  Orthopedic  No limitations of motion  feet .  No crepitus or effusions noted.  No bony pathology or digital deformities noted.  Skin  normotropic skin with no porokeratosis noted bilaterally.  No signs of infections or ulcers noted.     Onychomycosis  Pain in right toes  Pain in left toes  Consent was obtained for treatment procedures.   Mechanical debridement of nails 1-5  bilaterally performed with a nail nipper.  Filed with dremel without incident.    Return office visit    3 months                 Told patient to return for periodic foot care and evaluation due to potential at risk complications.   Gardiner Barefoot DPM

## 2021-09-18 NOTE — Telephone Encounter (Signed)
Lmtcb for pt/pts wife.

## 2021-09-21 ENCOUNTER — Ambulatory Visit: Payer: Medicare Other | Admitting: Pulmonary Disease

## 2021-09-21 ENCOUNTER — Other Ambulatory Visit: Payer: Self-pay

## 2021-09-21 ENCOUNTER — Encounter: Payer: Self-pay | Admitting: Pulmonary Disease

## 2021-09-21 VITALS — BP 110/70 | HR 80 | Temp 98.0°F | Ht 69.0 in | Wt 211.2 lb

## 2021-09-21 DIAGNOSIS — J189 Pneumonia, unspecified organism: Secondary | ICD-10-CM | POA: Diagnosis not present

## 2021-09-21 DIAGNOSIS — E1165 Type 2 diabetes mellitus with hyperglycemia: Secondary | ICD-10-CM

## 2021-09-21 DIAGNOSIS — J45991 Cough variant asthma: Secondary | ICD-10-CM | POA: Diagnosis not present

## 2021-09-21 DIAGNOSIS — I10 Essential (primary) hypertension: Secondary | ICD-10-CM

## 2021-09-21 NOTE — Patient Instructions (Addendum)
You were seen today by Lauraine Rinne, NP  for:   1. Pneumonia of left lung due to infectious organism, unspecified part of lung  - DG Chest 2 View; Future  Continue to monitor for symptoms such as: Reduced appetite, fever, productive cough, notify our office if the symptoms occur  2. Cough variant asthma  vs uacs/ pseudoasthma  Continue Symbicort 80 >>> 2 puffs in the morning right when you wake up, rinse out your mouth after use >>> Take this daily, no matter what >>> This is not a rescue inhaler     We recommend today:  Orders Placed This Encounter  Procedures   DG Chest 2 View    Standing Status:   Future    Standing Expiration Date:   12/21/2021    Order Specific Question:   Reason for Exam (SYMPTOM  OR DIAGNOSIS REQUIRED)    Answer:   follow up pneumonia    Order Specific Question:   Preferred imaging location?    Answer:   Internal    Order Specific Question:   Radiology Contrast Protocol - do NOT remove file path    Answer:   [email or web address]   Orders Placed This Encounter  Procedures   DG Chest 2 View   No orders of the defined types were placed in this encounter.   Follow Up:    Return in about 4 months (around 01/21/2022), or if symptoms worsen or fail to improve, for Follow up with Dr. Melvyn Novas.   Notification of test results are managed in the following manner: If there are  any recommendations or changes to the  plan of care discussed in office today,  we will contact you and let you know what they are. If you do not hear from [place], then your results are normal and you can view them through your  MyChart account , or a letter will be sent to you. Thank you again for trusting [place] with your care  - Thank you, [facility]    It is flu season:   >>> Best ways to protect herself from the flu: Receive the yearly flu vaccine, practice good hand hygiene washing with soap and also using hand sanitizer when  available, eat a nutritious meals, get adequate rest, hydrate appropriately       Please contact the office if your symptoms worsen or you have concerns that you are not improving.   Thank you for choosing [facility] for your healthcare, and for allowing [place] to partner with you on your healthcare journey. I am thankful to be able to provide care to you today.   Wyn Quaker FNP-C

## 2021-09-21 NOTE — Assessment & Plan Note (Signed)
Plan: Symbicort 80 2 puffs daily Follow-up with Dr. Melvyn Novas for routine follow-up in 4 months Chest x-ray sometime over the next few weeks, no x-ray in our office today due to inclement weather and call outs.  Patient agreeable to stop by office in 1 to 2 weeks to get chest x-ray completed.

## 2021-09-21 NOTE — Assessment & Plan Note (Signed)
Recent hospitalization for left lower lobe pneumonia and sepsis Patient reporting clinical improvement Clinical exam today shows clinical improvement Improved aeration on previous chest imaging  Plan: Chest x-ray sometime over the next 1 to 2 weeks, patient agreeable to come by our office to complete Reviewed with patient symptoms to monitor for in case of recurrent pneumonia
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
## 2021-09-25 DIAGNOSIS — Z8719 Personal history of other diseases of the digestive system: Secondary | ICD-10-CM | POA: Diagnosis not present

## 2021-09-26 ENCOUNTER — Other Ambulatory Visit: Payer: Self-pay | Admitting: Family Medicine

## 2021-09-26 DIAGNOSIS — R413 Other amnesia: Secondary | ICD-10-CM

## 2021-09-27 NOTE — Telephone Encounter (Signed)
OV 09/21/21.   Nothing further needed at this time.

## 2021-09-28 DIAGNOSIS — I2510 Atherosclerotic heart disease of native coronary artery without angina pectoris: Secondary | ICD-10-CM | POA: Diagnosis not present

## 2021-10-01 ENCOUNTER — Ambulatory Visit (INDEPENDENT_AMBULATORY_CARE_PROVIDER_SITE_OTHER): Payer: Medicare Other | Admitting: Pharmacist

## 2021-10-01 DIAGNOSIS — Z7901 Long term (current) use of anticoagulants: Secondary | ICD-10-CM

## 2021-10-01 DIAGNOSIS — I10 Essential (primary) hypertension: Secondary | ICD-10-CM

## 2021-10-01 DIAGNOSIS — E1165 Type 2 diabetes mellitus with hyperglycemia: Secondary | ICD-10-CM

## 2021-10-01 DIAGNOSIS — E1169 Type 2 diabetes mellitus with other specified complication: Secondary | ICD-10-CM

## 2021-10-01 DIAGNOSIS — E785 Hyperlipidemia, unspecified: Secondary | ICD-10-CM

## 2021-10-01 NOTE — Chronic Care Management (AMB) (Signed)
Chronic Care Management Pharmacy Note  10/01/2021 Name:  Garrett [name] MRN:  [id number] DOB:  August 04, 1951  Subjective: Garrett [name] is an 70 y.o. year old male who is a primary patient of Ann Held, DO.  The CCM team was consulted for assistance with disease management and care coordination needs.    Engaged with patient by telephone for follow up visit in response to provider referral for pharmacy case management and/or care coordination services.   Consent to Services:  The patient was given information about Chronic Care Management services, agreed to services, and gave verbal consent prior to initiation of services.  Please see initial visit note for detailed documentation.   Patient Care Team: Carollee Herter, Alferd Apa, DO as PCP - General (Family Medicine) Milus Banister, MD (Gastroenterology) [name], MD (Neurosurgery) Calvert Cantor, MD as Consulting Physician (Ophthalmology) Tanda Rockers, MD as Consulting Physician (Pulmonary Disease) Cameron Sprang, MD as Consulting Physician (Neurology) [name], MD as Consulting Physician (Neurosurgery) Marlaine Hind, MD as Consulting Physician (Physical Medicine and Rehabilitation) Cherre Robins, PharmD (Pharmacist) Ralene Ok, MD as Consulting Physician (General Surgery)  Recent office visits: 05/11/21 PCP (Dr. Carollee Herter)- Annual visit. Start farxiga 5 mg.  Recent consult visits: 09/28/2021 - Heart and [facility] (Dr Lamonte Sakai - [facility]) Seen for f/u CAD. No med changes. F/U 6 months. 09/25/2021 - Surgeon (Dr [name] - [facility]) Seen for hiatal hernia. Patient would like to avoid surgery if possible. Suggested esophageal dilation after appt with Dr [name] / GI.  09/21/2021 - Pumlonary Warner Mccreedy, NP) F/U cough variant asthma. Recommended chest eray in 1 to 2 weeks. No medication changes.  09/12/2021 - Podiatry (Dr Prudence Davidson) diabetic foot care - debrided nails. No med changes.   07/05/2021 GI Berniece Pap, NP) Seen for dysphagia. Stopped pantoprazole due to patient preference.  06/27/21 - Audiology (Dr Rachell Cipro) Replace left hearing aid; 06/27/21 Neuro (Dr Delice Lesch) neuropathy and migraines. No med changes - Referral to PT for right foot drop. F/U 1 year. 06/15/21 - Phone Call - PA for atrovastatin 40mg  - take 2 tablets daily. quantity limit and per insurance. Changed to 80mg  daily.   Hospital visits: Performed medication review today to reconcile discharge medications and medication list in Epic.   08/14/2021 to 08/20/2021 Hospital Admission to [facility] for severe sepsis due to left sided pneumonia.  Medications Started at Discharge: Augmentin 875mg  - take 1 tablet every 12 hours for 4 days.  guaiFENesin-dextromethorphan (ROBITUSSIN DM) syrup - Take 5 mLs by mouth every 4 (four) hours as needed for cough magic mouthwash -Take 5 mLs by mouth 3 (three) times daily as needed for up to 5 days for mouth pain.,  Medications Stopped at Discharge - None Medications changeds at Discharge - None  08/14/2021 - GI (Dr [name]) Colonoscopy and EGD. Noted recurrent hiatal hernia. Dilated GE gunction. Two polyps removed. Noted to have diverticula. 02/20/2021 - to 02/27/2021 Hospital admission for hital hernia. No medication changes noted at discharge  Objective:  Lab Results  Component Value Date   CREATININE 1.41 08/30/2021   CREATININE 1.11 08/19/2021   CREATININE 1.03 08/18/2021    Lab Results  Component Value Date   HGBA1C 7.5 (H) 08/17/2021   Last diabetic Eye exam:  Lab Results  Component Value Date/Time   HMDIABEYEEXA No Retinopathy 12/21/2018 12:34 PM    Last diabetic Foot exam: No results found for: HMDIABFOOTEX      Component Value Date/Time  CHOL 152 05/11/2021 0955   CHOL 162 07/18/2015 0744   TRIG 131.0 05/11/2021 0955   TRIG 110 07/18/2015 0744   HDL 41.30 05/11/2021 0955   HDL 41 07/18/2015 0744   CHOLHDL 4 05/11/2021 0955   VLDL  26.2 05/11/2021 0955   LDLCALC 84 05/11/2021 0955   LDLCALC 129 (H) 08/17/2020 0948   LDLCALC 99 07/18/2015 0744   LDLDIRECT 104.0 02/14/2020 1025    Hepatic Function Latest Ref Rng & Units 08/30/2021 08/15/2021 08/14/2021  Total Protein 6.0 - 8.3 g/dL 7.2 5.5(L) 6.2(L)  Albumin 3.5 - 5.2 g/dL 4.1 2.9(L) 3.3(L)  AST 0 - 37 U/L 29 28 51(H)  ALT 0 - 53 U/L 43 39 52(H)  Alk Phosphatase 39 - 117 U/L 60 39 44  Total Bilirubin 0.2 - 1.2 mg/dL 0.6 0.8 0.7  Bilirubin, Direct 0.0 - 0.3 mg/dL - - -    Lab Results  Component Value Date/Time   TSH 1.30 04/28/2018 10:16 AM   TSH 1.24 02/03/2017 10:50 AM    CBC Latest Ref Rng & Units 08/30/2021 08/17/2021 08/16/2021  WBC 4.0 - 10.5 K/uL 7.0 10.0 12.4(H)  Hemoglobin 13.0 - 17.0 g/dL 12.8(L) 10.4(L) 11.1(L)  Hematocrit 39.0 - 52.0 % 39.8 33.3(L) 34.6(L)  Platelets 150.0 - 400.0 K/uL 362.0 229 224    No results found for: VD25OH  Clinical ASCVD: Yes  The 10-year ASCVD risk score (Arnett DK, et al., 2019) is: 32.1%   Values used to calculate the score:     Age: 10 years     Sex: Male     Is Non-Hispanic African American: No     Diabetic: Yes     Tobacco smoker: No     Systolic Blood Pressure: 517 mmHg     Is BP treated: Yes     HDL Cholesterol: 41.3 mg/dL     Total Cholesterol: 152 mg/dL      Social History   Tobacco Use  Smoking Status Former   Packs/day: 3.00   Years: 30.00   Pack years: 90.00   Types: Cigarettes   Quit date: 01/09/1991   Years since quitting: 30.7  Smokeless Tobacco Former   Types: Snuff   BP Readings from Last 3 Encounters:  09/21/21 110/70  08/30/21 128/80  08/20/21 133/81   Pulse Readings from Last 3 Encounters:  09/21/21 80  08/30/21 76  08/20/21 67   Wt Readings from Last 3 Encounters:  09/21/21 211 lb 3.2 oz (95.8 kg)  08/30/21 215 lb 3.2 oz (97.6 kg)  08/15/21 228 lb 6.3 oz (103.6 kg)    Assessment: Review of patient past medical history, allergies, medications, health status, including  review of consultants reports, laboratory and other test data, was performed as part of comprehensive evaluation and provision of chronic care management services.   SDOH:  (Social Determinants of Health) assessments and interventions performed:      CCM Care Plan  Allergies  Allergen Reactions   Bee Venom Anaphylaxis   Garlic Swelling    Medications Reviewed Today     Reviewed by Cherre Robins, PharmD (Pharmacist) on 10/01/21 at [phone]  Med List Status: <None>   Medication Order Taking? Sig Documenting Provider Last Dose Status Informant  albuterol (PROVENTIL) (2.5 MG/3ML) 0.083% nebulizer solution [id number] Yes Take 3 mLs (2.5 mg total) by nebulization every 6 (six) hours as needed for wheezing or shortness of breath. [name] R, DO Taking Active Self  allopurinol (ZYLOPRIM) 100 MG tablet [id number] Yes TAKE 1 TABLET  BY MOUTH  DAILY Ann Held, DO Taking Active   atorvastatin (LIPITOR) 80 MG tablet [id number] Yes Take 80 mg by mouth every evening. [provider] Taking Active Self  budesonide-formoterol (SYMBICORT) 80-4.5 MCG/ACT inhaler [id number] Yes Inhale 2 puffs into the lungs daily. [provider] Taking Active Self  celecoxib (CELEBREX) 200 MG capsule [id number] Yes TAKE 1 CAPSULE BY MOUTH  TWICE DAILY [name] R, DO Taking Active   EPINEPHrine 0.3 mg/0.3 mL IJ SOAJ injection [id number] Yes Inject 0.3 mg into the muscle as needed for anaphylaxis.  [provider] Taking Active Self  famotidine (PEPCID) 20 MG tablet [id number] Yes Take 1 tablet (20 mg total) by mouth at bedtime. Milus Banister, MD Taking Active Self  fenofibrate 160 MG tablet [id number] Yes TAKE 1 TABLET BY MOUTH  DAILY  Patient taking differently: Take 160 mg by mouth at bedtime.   [name] R, DO Taking Active   fluticasone (FLONASE) 50 MCG/ACT nasal spray [id number] Yes Place 2 sprays into both nostrils daily. Ann Held, DO Taking  Active Self  furosemide (LASIX) 40 MG tablet [id number] Yes Take 40 mg by mouth See admin instructions. 87m in the morning  20 mg in evening [provider] Taking Active Self  gabapentin (NEURONTIN) 100 MG capsule [id number] TAKE 2 CAPSULES BY MOUTH AT BEDTIME SLyndal Pulley DO Taking Active   levocetirizine (XYZAL) 5 MG tablet [id number] Take 1 tablet (5 mg total) by mouth every evening. LAnn Held DO Taking Active Self  magnesium oxide (MAG-OX) 400 (241.3 Mg) MG tablet [id number] Take 1 tablet (400 mg total) by mouth daily. AReyne Dumas MD Taking Active Self  meclizine (ANTIVERT) 25 MG tablet [id number] Take 1 tablet (25 mg total) by mouth 3 (three) times daily as needed for dizziness. LCarollee Herter YKendrick FriesR, DO Taking Active   memantine (NAMENDA) 10 MG tablet [id number] TAKE 2 TABLETS BY MOUTH  DAILY LAnn Held DO Taking Active   metFORMIN (GLUCOPHAGE) 500 MG tablet [id number] TAKE 1 TABLET BY MOUTH  TWICE DAILY WITH A MEAL LRoma SchanzR, DO Taking Active   metoprolol tartrate (LOPRESSOR) 50 MG tablet [id number] Take 50 mg by mouth 2 (two) times daily. [provider] Taking Active Self  Multiple Vitamins-Minerals (ONE-A-DAY WEIGHT SMART ADVANCE PO) [id number] Take 1 tablet by mouth daily. Centrum Silver [provider] Taking Active Self  nitroGLYCERIN (NITROSTAT) 0.4 MG SL tablet [id number] Place 0.4 mg under the tongue every 5 (five) minutes as needed for chest pain. [provider] Taking Active Self  omeprazole (PRILOSEC) 40 MG capsule [id number] Take 40 mg by mouth 2 (two) times daily. [provider] Taking Active Self  ODonald Sivatest strip [id number] USE AS DIRECTED THREE TIMES DAILY. LAnn Held DO Taking Active Self  potassium chloride SA (KLOR-CON) 20 MEQ tablet [id number] TAKE 2 TABLETS BY MOUTH  DAILY LAnn Held DO Taking Active   PROAIR HFA  108 [phone]) MCG/ACT inhaler [id number] Inhale 1 puff into the lungs every 6 (six) hours as needed for wheezing or shortness of breath. LRoma SchanzR, DO Taking Active   Semaglutide,0.25 or 0.5MG/DOS, (OZEMPIC, 0.25 OR 0.5 MG/DOSE,) 2 MG/1.5ML SOPN [id number] Inject into the skin. [provider] Taking Active   SUMAtriptan (IMITREX) 50 MG tablet [id number] Take 1 tablet as needed for migraine/vertigo.  Do not take more than 3 a week  Patient taking differently: Take 50 mg by mouth every 2 (two) hours as needed for migraine. Max 3 tabs per week   Carollee Herter, Alferd Apa, DO Taking Active   tadalafil (CIALIS) 20 MG tablet [id number] Yes Take 0.5-1 tablets (10-20 mg total) by mouth every other day as needed for erectile dysfunction. Ann Held, DO Taking Active Self  topiramate (TOPAMAX) 50 MG tablet [id number] Yes Take 1 tablet (50 mg total) by mouth 2 (two) times daily. Cameron Sprang, MD Taking Active Self  verapamil (CALAN-SR) 240 MG CR tablet [id number] Yes TAKE 1 TABLET BY MOUTH AT  BEDTIME Ann Held, DO Taking Active   vitamin B-12 (CYANOCOBALAMIN) 1000 MCG tablet [id number] Yes Take 1,000 mcg by mouth daily. [provider] Taking Active Self  XARELTO 20 MG TABS tablet [id number] Yes TAKE 1 TABLET BY MOUTH  DAILY WITH SUPPER Tanda Rockers, MD Taking Active             Patient Active Problem List   Diagnosis Date Noted   Severe sepsis ([place]) 08/14/2021   Pneumonia 08/14/2021   Lactic acidosis 08/14/2021   Hypokalemia 08/14/2021   DOE (dyspnea on exertion) 05/11/2021   Abnormal stress test 04/20/2021   Acute respiratory distress    Acute respiratory failure with hypoxia (HCC)    S/P Nissen fundoplication (without gastrostomy tube) procedure 02/20/2021   Body mass index (BMI) 33.0-33.9, adult 11/03/2019   Laceration of right hand 08/02/2019   Acute pulmonary embolism ([place]) 01/07/2019   Chronic diastolic CHF (congestive heart  failure) ([place]) 01/07/2019   Preventative health care 02/10/2018   Dizziness 02/10/2018   Spondylolisthesis at L3-L4 level 10/28/2017   Cough variant asthma  vs uacs/ pseudoasthma 06/17/2017   Pneumonia of right middle lobe due to infectious organism 02/28/2017   Sepsis ([place]) 02/28/2017   Pulmonary embolism and infarction ([place]) 02/09/2017   Bilateral pulmonary embolism ([place]) 02/04/2017   Greater trochanteric bursitis of left hip 01/14/2017   Greater trochanteric bursitis of right hip 12/20/2016   Degenerative arthritis of knee, bilateral 10/08/2016   Upper airway cough syndrome 03/22/2016   Multiple pulmonary nodules 03/22/2016   Acute bronchitis 01/22/2016   Headache disorder 01/04/2016   Acute upper respiratory infection 10/25/2015   Elevated CK 07/18/2015   History of colonic polyps 04/11/2015   Lumbar stenosis with neurogenic claudication 11/14/2014   Spondylolysis of cervical region 02/25/2014   Lumbosacral spondylosis without myelopathy 01/06/2014   OSA (obstructive sleep apnea) 12/08/2013   SOB (shortness of breath) on exertion 11/04/2013   Morbid obesity due to excess calories ([place])    Cervicalgia    Venous insufficiency of leg 02/18/2012   Itching 02/18/2012   Mild dementia 05/28/2011   Hyperlipidemia associated with type 2 diabetes mellitus ([place]) 05/27/2011   Controlled type 2 diabetes mellitus with diabetic nephropathy ([place]) 04/10/2011   Gout 04/10/2011   Essential hypertension 04/10/2011   OA (osteoarthritis) 04/10/2011   GERD (gastroesophageal reflux disease) 04/10/2011   Migraine headache without aura 04/10/2011   Allergic rhinitis, cause unspecified 04/10/2011   Bee sting allergy 04/10/2011    Immunization History  Administered Date(s) Administered   Fluad Quad(high Dose 65+) 10/07/2019, 10/19/2020, 08/30/2021   Influenza Split 09/30/2012   Influenza, High Dose Seasonal PF 09/19/2016, 10/07/2017, 10/16/2018   Influenza,inj,Quad PF,6+ Mos 09/15/2013,  09/26/2014, 09/22/2015   PFIZER(Purple Top)SARS-COV-2 Vaccination 02/12/2020, 03/07/2020, 08/19/2020   Pneumococcal Conjugate-13 07/18/2015   Pneumococcal Polysaccharide-23 04/10/2011, 09/19/2016  Tdap 04/10/2011, 04/09/2019, 07/22/2019   Zoster, Live 03/28/2014    Conditions to be addressed/monitored: CHF, CAD, HTN, HLD, Hypertriglyceridemia, DMII, and Asthma, neuropathy, migraine headaches, GERD, gout, mild anemia, OSA; recurrent PE with chronic anticoagulation  Care Plan : General Pharmacy (Adult)  Updates made by Cherre Robins, PHARMD since 10/01/2021 12:00 AM     Problem: astham, hyperlipidemia; type 2 DM; CAD; CHF; HTN; recurrent PE; chronic anticoagulation; migraines; neuropathy; gout; GERD; mild dementia; vertigo; OSA; seasonal allergies   Priority: High  Onset Date: 08/07/2021  Note:   Current Barriers:  Unable to maintain control of type 2 DM Does not adhere to prescribed medication regimen Education needed regarding medication regimen and chronic conditions  Pharmacist Clinical Goal(s):  Over the next 90 days, patient will achieve adherence to monitoring guidelines and medication adherence to achieve therapeutic efficacy achieve control of type 2 DM as evidenced by A1c < 7.0 maintain control of HTN, CHF, hyperlipidemia and asthma  as evidenced by attainment of goals listed below  through collaboration with PharmD and provider.   Interventions: 1:1 collaboration with Carollee Herter, Alferd Apa, DO regarding development and update of comprehensive plan of care as evidenced by provider attestation and co-signature Inter-disciplinary care team collaboration (see longitudinal plan of care) Comprehensive medication review performed; medication list updated in electronic medical record    Hypertension / CHF:  BP Readings from Last 3 Encounters:  07/18/21 123/85  07/05/21 124/72  06/27/21 131/79  Controlled; Goal: BP <140/90, minimize symptoms of CHF and prevent CHF  exacerbations Current regimen:  Verapamil 240mg  daily at bedtime Metoprolol tartrate 50mg  twice a day Furosemide 40mg  each morning and 20mg  each evening Potassium chloride 44mEq - take 2 tablets daily Type: Diastolic Last ejection fraction: 03/03/17 EF: 65-70% NYHA Class: II (slight limitation of activity) AHA HF Stage: B (Heart disease present - no symptoms present) Patient has failed these meds in past: None noted  Last BNP was 123 (02/26/2021) Home blood pressure readings:  120-130 / 70-80 Interventions: Discussed blood pressure goal Check blood pressure 2 to 3 time per week, document, and provide at future appointments Ensure daily salt intake < 2300 mg/day  Hyperlipidemia / CAD;  Lab Results  Component Value Date/Time   LDLCALC 84 05/11/2021 09:55 AM   LDLCALC 129 (H) 08/17/2020 09:48 AM   LDLCALC 99 07/18/2015 07:44 AM   LDLDIRECT 104.0 02/14/2020 10:25 AM  Not at goal; LDL goal < 70 Current regimen:  Atorvastatin 80mg  daily (dose increased 05/11/2021) Fenofibrate 160mg  daily Interventions: Discussed LDL goal Maintain cholesterol medication regimen - expect LDL improved with higher atorvastatin dose.   Diabetes Lab Results  Component Value Date/Time   HGBA1C 7.5 (H) 05/11/2021 09:55 AM   HGBA1C 6.8 (H) 02/22/2021 04:03 AM  A1c increasing;  A1c goal <7.0 Checks BG: Daily Recent FBG Readings: 120 to 130's Denie s/sx of hypoglycemia.  Current regimen:  Metformin 500mg  twice a day with food Ozempic 0.25mg  injected once per week  Previous medications tried: metformin 1000mg  bid - GI side effects; Victoza daily - stopped after weight loss and improvement in A1c.  Due to recent hospitalization patient started Ozempic 0.25mg  Sunday 08/26/2021. Reports he tolerated well with no nausea.  Has noted decreased in appetite and weight has decreased from around 224 in July 2022 to 212lbs last week.  Applied for [employer] medication assistance and was approved.   Interventions: Discussed A1c goals Continue Ozempic 0.25mg  SQ weekly - he has been approved for [employer] assistance thru 11/30 2022  but first order has not shipped yet. Will leave patient sample to pick up.   Reviewed home blood glucose readings and reviewed goals  Fasting blood glucose goal (before meals) = 80 to 130 Blood glucose goal after a meal = less than 180  Patient self care activities - Over the next 90 days, patient will: Check blood sugar once daily, document, and provide at future appointments Continue Ozempic 0.88m inject subcutaneously once a week  History of Pulmonary embolism (blood clot)  Controlled; Goal: prevent recurrence of blood clots and prevent stroke Current regimen:  Xarelto 2768mdaily with supper Interventions: (addressed at previous visit)  Screened for patient assistance program (unfortunately patient did not qualify this year)  Continue to take Xarelto daily   Asthma: Controlled; Goal: improve breathing and prevent hospitalizations related to asthma exacerbations Current regimen:  Symbicort 80/4.68m40m- inhaler 2 puffs into lungs once a day (during allergy season) Albuterol inhaler or nebulization solution - use as needed for shortness of breath or wheezing Patient has failed these meds in past: Trelegy  Using maintenance inhaler regularly? Yes during allergy season (Spring and Fall)  Frequency of rescue inhaler use: rarely uses current but during allergy season 1 or 2 time per week Managed by pulmonology - Dr. WerMelvyn Novasterventions: Discussed maintenance versus rescue therapy.  Discussed seasonal triggers  Continue to follow up with Dr WerMelvyn Novasontinue above medication regimen.   Medication management Pharmacist Clinical Goal(s): Over the next 90 days, patient will work with PharmD and providers to maintain optimal medication adherence Current pharmacy: [facility] Interventions Comprehensive medication review  performed. Performed review of medications from recent hospitalization and discharge summary. Patient has completed antibiotic therapy and is not longer in need of Robitussin or Magic Mouthwash prescribed at discharge. Removed from medication list.  Continue current medication management strategy Focus on medication adherence by filling and taking medications appropriately  Take medications as prescribed Report any questions or concerns to PharmD and/or provider(s)  Patient Goals/Self-Care Activities Over the next 90 days, patient will:  take medications as prescribed,  check glucose daily, document, and provide at future appointments,  collaborate with provider on medication access solutions.  Follow Up Plan: Telephone follow up appointment with care management team member scheduled for:  patient will see PCP in November. Will have CMA check on 2023 MAP with Ozempic in November. Clinical pharmacist will follow up in December.        Medication Assistance: Patient is in Medicare coverage gap and cost of Symbicort, FarW[place] Gladed Xarelto has increased.Patient states assistance would be helpful but he can afford to continue these medications even if he dose not qualify. During 08/08/2021 phone visit performed review of patient assistance program qualifications and unfortunately patient does not currently qualify for patient assistance of the above mention  Patient has been approved for NovoNordisk PAP for Ozempic through 11/21/2021. First fill has not shipped yet. Will leave him a sample until PAP med is received.  [facility] pharmacy [facility]:  [facility] [id number] [place]: [phone]: [phone]  ([facility] [place]one: [phone]: [phone] Hospitallivery ([facility]) - [address]one: [phone]: [phone]  Uses pill box? Yes Pt endorses 99% compliance  Follow Up:  Patient agrees to Care Plan and Follow-up.  Plan: Telephone follow up appointment with care management team member scheduled for:  December 2022; Will see PCP in November 2022.   Cherre Robins, PharmD Clinical Pharmacist [facility]

## 2021-10-01 NOTE — Patient Instructions (Signed)
Visit Information  PATIENT GOALS:  Goals Addressed             This Visit's Progress    Chronic Care Management Pharmacy Care Plan   On track    CARE PLAN ENTRY (see longitudinal plan of care for additional care plan information)  Current Barriers:  Chronic Disease Management support, education, and care coordination needs related to type 2 diabetes, elevated cholesterol, heart failure and hypertension. History of pulmonary embolism,  Asthma, reflux, Migraine, Allergies, Gout, Pain, Vertigo   Hypertension BP Readings from Last 3 Encounters:  09/21/21 110/70  08/30/21 128/80  08/20/21 133/81  Pharmacist Clinical Goal(s): Over the next 90 days, patient will work with PharmD and providers to maintain BP goal <140/90 Current regimen:  Verapamil 240mg  daily at bedtime Metoprolol tartrate 50mg  twice a day Interventions: Discussed blood pressure goal Updated medication list to reflect increase in metoprolol after last visit with cardiology  Patient self care activities - Over the next 90 days, patient will: Check blood pressure 2 to 3 time per week, document, and provide at future appointments Ensure daily salt intake < 2300 mg/day  Hyperlipidemia Lab Results  Component Value Date/Time   LDLCALC 84 05/11/2021 09:55 AM   LDLCALC 129 (H) 08/17/2020 09:48 AM   LDLCALC 99 07/18/2015 07:44 AM   LDLDIRECT 104.0 02/14/2020 10:25 AM  Pharmacist Clinical Goal(s): Over the next 90 days, patient will work with PharmD and providers to achieve LDL goal < 70 Current regimen:  Atorvastatin 80mg  daily  Fenofibrate 160mg  daily Interventions: Discussed LDL goal Patient self care activities - Over the next 90 days, patient will: Maintain cholesterol medication regimen.  Diabetes Lab Results  Component Value Date/Time   HGBA1C 7.5 (H) 08/17/2021 05:29 AM   HGBA1C 7.5 (H) 05/11/2021 09:55 AM  Pharmacist Clinical Goal(s): Over the next 90 days, patient will work with PharmD and providers  to achieve A1c goal <7% Current regimen:  Metformin 500mg  twice a day with food Ozampic 0.25mg  once per week Interventions: Discussed A1c goals Approved for [employer] patient assistance. Has not shipped yet.  Reviewed home blood glucose readings and reviewed goals  Fasting blood glucose goal (before meals) = 80 to 130 Blood glucose goal after a meal = less than 180  Patient self care activities - Over the next 90 days, patient will: Check blood sugar once daily, document, and provide at future appointments Continue Ozempic 0.25mg  inject subcutaneously once a week  Contact provider with any episodes of hypoglycemia  History of Pulmonary embolism (blood clot)  Pharmacist Clinical Goal(s) Over the next 90 days, patient will work with PharmD and providers to prevent recurrence of blood clots and prevent stroke Current regimen:  Xarelto 20mg  daily with supper Interventions: Screened for patient assistance program (unfortunately patient did not qualify this year)  Patient self care activities - Over the next 90 days, patient will: Continue to take Xarelto daily  Asthma: Pharmacist Clinical Goal(s) Over the next 90 days, patient will work with PharmD and providers to improve breathing and prevent hospitalizations related to asthma exacerbations Current regimen:  Symbicort 80/4.22mcg - inhaler 2 puffs into lungs once a day Albuterol inhaler or nebulization solution - use as needed for shortness of breath or wheezing Interventions: Discussed maintenance versus rescue therapy.  Discussed seasonal as the triggers  Patient self care activities - Over the next 90 days, patient will: Continue to follow up with Dr Melvyn Novas  Continue above medication regimen.   Medication management Pharmacist Clinical  Goal(s): Over the next 90 days, patient will work with PharmD and providers to maintain optimal medication adherence Current pharmacy: [facility]  Pharmacy Interventions Comprehensive medication review performed. Performed review of medications from recent hospitalization and discharge summary.  Continue current medication management strategy Patient self care activities - Over the next 90 days, patient will: Focus on medication adherence by filling and taking medications appropriately  Take medications as prescribed Report any questions or concerns to PharmD and/or provider(s)  Please see past updates related to this goal by clicking on the "Past Updates" button in the selected goal          Patient verbalizes understanding of instructions provided today and agrees to view in [place].   Telephone follow up appointment with care management team member scheduled for: December 2022  Cherre Robins, PharmD Clinical Pharmacist [place] [phone]

## 2021-10-02 DIAGNOSIS — I110 Hypertensive heart disease with heart failure: Secondary | ICD-10-CM | POA: Diagnosis not present

## 2021-10-02 DIAGNOSIS — H8110 Benign paroxysmal vertigo, unspecified ear: Secondary | ICD-10-CM | POA: Diagnosis not present

## 2021-10-02 DIAGNOSIS — H903 Sensorineural hearing loss, bilateral: Secondary | ICD-10-CM | POA: Diagnosis not present

## 2021-10-02 DIAGNOSIS — I2699 Other pulmonary embolism without acute cor pulmonale: Secondary | ICD-10-CM | POA: Diagnosis not present

## 2021-10-02 DIAGNOSIS — R531 Weakness: Secondary | ICD-10-CM | POA: Diagnosis not present

## 2021-10-02 DIAGNOSIS — I8290 Acute embolism and thrombosis of unspecified vein: Secondary | ICD-10-CM | POA: Diagnosis not present

## 2021-10-02 DIAGNOSIS — H818X2 Other disorders of vestibular function, left ear: Secondary | ICD-10-CM | POA: Diagnosis not present

## 2021-10-02 DIAGNOSIS — Z974 Presence of external hearing-aid: Secondary | ICD-10-CM | POA: Diagnosis not present

## 2021-10-02 DIAGNOSIS — I5030 Unspecified diastolic (congestive) heart failure: Secondary | ICD-10-CM | POA: Diagnosis not present

## 2021-10-02 DIAGNOSIS — Z7901 Long term (current) use of anticoagulants: Secondary | ICD-10-CM | POA: Diagnosis not present

## 2021-10-02 DIAGNOSIS — Z87891 Personal history of nicotine dependence: Secondary | ICD-10-CM | POA: Diagnosis not present

## 2021-10-02 DIAGNOSIS — E785 Hyperlipidemia, unspecified: Secondary | ICD-10-CM | POA: Diagnosis not present

## 2021-10-02 DIAGNOSIS — E1142 Type 2 diabetes mellitus with diabetic polyneuropathy: Secondary | ICD-10-CM | POA: Diagnosis not present

## 2021-10-16 ENCOUNTER — Ambulatory Visit (INDEPENDENT_AMBULATORY_CARE_PROVIDER_SITE_OTHER): Payer: Medicare Other | Admitting: Gastroenterology

## 2021-10-16 ENCOUNTER — Encounter: Payer: Self-pay | Admitting: Gastroenterology

## 2021-10-16 ENCOUNTER — Telehealth: Payer: Self-pay

## 2021-10-16 VITALS — BP 126/64 | HR 75 | Ht 69.0 in | Wt 213.0 lb

## 2021-10-16 DIAGNOSIS — R1310 Dysphagia, unspecified: Secondary | ICD-10-CM

## 2021-10-16 NOTE — Patient Instructions (Signed)
If you are age 71 or older, your body mass index should be between 23-30. Your Body mass index is 31.45 kg/m. If this is out of the aforementioned range listed, please consider follow up with your Primary Care Provider. ________________________________________________________  The [place] [facility] providers would like to encourage you to use [facility] to communicate with providers for non-urgent requests or questions.  Due to long hold times on the telephone, sending your provider a message by [facility] may be a faster and more efficient way to get a response.  Please allow 48 business hours for a response.  Please remember that this is for non-urgent requests.  _______________________________________________________  Dennis Bast have been scheduled for an endoscopy. Please follow written instructions given to you at your visit today. If you use inhalers (even only as needed), please bring them with you on the day of your procedure.  Due to recent changes in healthcare laws, you may see the results of your imaging and laboratory studies on MyChart before your provider has had a chance to review them.  We understand that in some cases there may be results that are confusing or concerning to you. Not all laboratory results come back in the same time frame and the provider may be waiting for multiple results in order to interpret others.  Please give [place] 48 hours in order for your provider to thoroughly review all the results before contacting the office for clarification of your results.   You will be contacted by our office prior to your procedure for directions on holding your Xarelto.  If you do not hear from our office 1 week prior to your scheduled procedure, please call [phone] to discuss.   Thank you for entrusting me with your care and choosing [facility].  Dr [name]
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
## 2021-10-16 NOTE — Telephone Encounter (Signed)
   Garrett [name] 1951-09-15 [id number]  Dear Dr Melvyn Novas  We have scheduled the above named patient for a colonoscopy procedure. Our records show that he is on anticoagulation therapy.  Please advise as to whether the patient may come off their therapy of Xarelto 2 days prior to their procedure which is scheduled for 11-01-21.  Please route your response to Harrel Lemon, CMA, or fax response to [phone].  Sincerely,    [facility]

## 2021-10-16 NOTE — Telephone Encounter (Signed)
I am ok with stopping xarelto for procedure but I haven't seen in over a year and his PCP is filling the rx for xarelto

## 2021-10-17 DIAGNOSIS — H903 Sensorineural hearing loss, bilateral: Secondary | ICD-10-CM | POA: Diagnosis not present

## 2021-10-17 DIAGNOSIS — H905 Unspecified sensorineural hearing loss: Secondary | ICD-10-CM | POA: Diagnosis not present

## 2021-10-17 NOTE — Telephone Encounter (Signed)
Patient's wife Tito Dine (on [place]) advised that Ronalee Belts has been given clearance to hold Xarelto 2 days prior to colonoscopy scheduled for 11-01-21.  Patient's wife Tito Dine advised to take last dose of Xarelto on 10-29-21, and he will be advised when to restart Xarelto by Dr [name] after the procedure.  Patient's wife Tito Dine agreed to plan and verbalized understanding.  No further questions.

## 2021-10-22 ENCOUNTER — Encounter (HOSPITAL_COMMUNITY): Payer: Self-pay | Admitting: Gastroenterology

## 2021-10-22 DIAGNOSIS — E1169 Type 2 diabetes mellitus with other specified complication: Secondary | ICD-10-CM | POA: Diagnosis not present

## 2021-10-22 DIAGNOSIS — I10 Essential (primary) hypertension: Secondary | ICD-10-CM

## 2021-10-22 DIAGNOSIS — E785 Hyperlipidemia, unspecified: Secondary | ICD-10-CM

## 2021-10-22 DIAGNOSIS — E1165 Type 2 diabetes mellitus with hyperglycemia: Secondary | ICD-10-CM | POA: Diagnosis not present

## 2021-10-25 ENCOUNTER — Telehealth: Payer: Self-pay | Admitting: Pharmacist

## 2021-10-25 NOTE — Chronic Care Management (AMB) (Signed)
Chronic Care Management Pharmacy Assistant   Name: Garrett [name]  MRN: [id number] DOB: Nov 03, 1951   Reason for Encounter: [place] application on Ozempic renewal and refill.   Medications: Outpatient Encounter Medications as of 10/25/2021  Medication Sig   albuterol (PROVENTIL) (2.5 MG/3ML) 0.083% nebulizer solution Take 3 mLs (2.5 mg total) by nebulization every 6 (six) hours as needed for wheezing or shortness of breath.   allopurinol (ZYLOPRIM) 100 MG tablet TAKE 1 TABLET BY MOUTH  DAILY   atorvastatin (LIPITOR) 80 MG tablet Take 80 mg by mouth every evening.   budesonide-formoterol (SYMBICORT) 80-4.5 MCG/ACT inhaler Inhale 2 puffs into the lungs daily.   celecoxib (CELEBREX) 200 MG capsule TAKE 1 CAPSULE BY MOUTH  TWICE DAILY   EPINEPHrine 0.3 mg/0.3 mL IJ SOAJ injection Inject 0.3 mg into the muscle as needed for anaphylaxis.    famotidine (PEPCID) 20 MG tablet Take 1 tablet (20 mg total) by mouth at bedtime.   fenofibrate 160 MG tablet TAKE 1 TABLET BY MOUTH  DAILY (Patient taking differently: Take 160 mg by mouth at bedtime.)   fluticasone (FLONASE) 50 MCG/ACT nasal spray Place 2 sprays into both nostrils daily.   furosemide (LASIX) 40 MG tablet Take 40 mg by mouth See admin instructions. 40mg  in the morning  20 mg in evening   gabapentin (NEURONTIN) 100 MG capsule TAKE 2 CAPSULES BY MOUTH AT BEDTIME   levocetirizine (XYZAL) 5 MG tablet Take 1 tablet (5 mg total) by mouth every evening.   magnesium oxide (MAG-OX) 400 (241.3 Mg) MG tablet Take 1 tablet (400 mg total) by mouth daily.   meclizine (ANTIVERT) 25 MG tablet Take 1 tablet (25 mg total) by mouth 3 (three) times daily as needed for dizziness.   memantine (NAMENDA) 10 MG tablet TAKE 2 TABLETS BY MOUTH  DAILY   metFORMIN (GLUCOPHAGE) 500 MG tablet TAKE 1 TABLET BY MOUTH  TWICE DAILY WITH A MEAL   metoprolol tartrate (LOPRESSOR) 50 MG tablet Take 50 mg by mouth 2 (two) times daily.   Multiple Vitamins-Minerals  (ONE-A-DAY WEIGHT SMART ADVANCE PO) Take 1 tablet by mouth daily. Centrum Silver   nitroGLYCERIN (NITROSTAT) 0.4 MG SL tablet Place 0.4 mg under the tongue every 5 (five) minutes as needed for chest pain.   omeprazole (PRILOSEC) 40 MG capsule Take 40 mg by mouth 2 (two) times daily.   ONETOUCH ULTRA test strip USE AS DIRECTED THREE TIMES DAILY.   potassium chloride SA (KLOR-CON) 20 MEQ tablet TAKE 2 TABLETS BY MOUTH  DAILY   PROAIR HFA 108 (90 Base) MCG/ACT inhaler Inhale 1 puff into the lungs every 6 (six) hours as needed for wheezing or shortness of breath.   Semaglutide,0.25 or 0.5MG /DOS, (OZEMPIC, 0.25 OR 0.5 MG/DOSE,) 2 MG/1.5ML SOPN Inject into the skin.   SUMAtriptan (IMITREX) 50 MG tablet Take 1 tablet as needed for migraine/vertigo. Do not take more than 3 a week (Patient taking differently: Take 50 mg by mouth every 2 (two) hours as needed for migraine. Max 3 tabs per week)   tadalafil (CIALIS) 20 MG tablet Take 0.5-1 tablets (10-20 mg total) by mouth every other day as needed for erectile dysfunction.   topiramate (TOPAMAX) 50 MG tablet Take 1 tablet (50 mg total) by mouth 2 (two) times daily.   verapamil (CALAN-SR) 240 MG CR tablet TAKE 1 TABLET BY MOUTH AT  BEDTIME   vitamin B-12 (CYANOCOBALAMIN) 1000 MCG tablet Take 1,000 mcg by mouth daily.   XARELTO 20 MG TABS  tablet TAKE 1 TABLET BY MOUTH  DAILY WITH SUPPER   No facility-administered encounter medications on file as of 10/25/2021.    I have pefilled and mailed patient assistance application renewal form for Ozempic 0.25 mg. I have also contacted [facility] about next refill date patient does have one last fill that will have a process date of 12/08/21. I have reached out to the patient about this he is aware of the patient assistance application and will return it back to PCP's office once completed. Patient states he will be out of his Ozempic by the 13th of November & would like to get a sample if possible since his last fill  from [facility] until December.   Corrie Mckusick, [place]

## 2021-10-30 ENCOUNTER — Other Ambulatory Visit: Payer: Self-pay

## 2021-10-30 DIAGNOSIS — R1310 Dysphagia, unspecified: Secondary | ICD-10-CM

## 2021-11-01 ENCOUNTER — Ambulatory Visit (HOSPITAL_COMMUNITY)
Admission: RE | Admit: 2021-11-01 | Discharge: 2021-11-01 | Disposition: A | Discharge status: Home / Self Care | Payer: Medicare Other | Source: Ambulatory Visit | Attending: Gastroenterology | Admitting: Gastroenterology

## 2021-11-01 ENCOUNTER — Other Ambulatory Visit: Payer: Self-pay

## 2021-11-01 ENCOUNTER — Encounter (HOSPITAL_COMMUNITY)
Admission: RE | Disposition: A | Discharge status: Home / Self Care | Payer: Self-pay | Source: Ambulatory Visit | Attending: Gastroenterology

## 2021-11-01 ENCOUNTER — Telehealth: Payer: Self-pay

## 2021-11-01 ENCOUNTER — Ambulatory Visit (HOSPITAL_COMMUNITY): Payer: Medicare Other | Admitting: Certified Registered"

## 2021-11-01 ENCOUNTER — Encounter (HOSPITAL_COMMUNITY): Payer: Self-pay | Admitting: Gastroenterology

## 2021-11-01 DIAGNOSIS — Z86010 Personal history of colonic polyps: Secondary | ICD-10-CM | POA: Insufficient documentation

## 2021-11-01 DIAGNOSIS — R1310 Dysphagia, unspecified: Secondary | ICD-10-CM | POA: Insufficient documentation

## 2021-11-01 DIAGNOSIS — G4730 Sleep apnea, unspecified: Secondary | ICD-10-CM | POA: Diagnosis not present

## 2021-11-01 DIAGNOSIS — Z79899 Other long term (current) drug therapy: Secondary | ICD-10-CM | POA: Diagnosis not present

## 2021-11-01 DIAGNOSIS — I10 Essential (primary) hypertension: Secondary | ICD-10-CM | POA: Insufficient documentation

## 2021-11-01 DIAGNOSIS — K2289 Other specified disease of esophagus: Secondary | ICD-10-CM | POA: Insufficient documentation

## 2021-11-01 DIAGNOSIS — Z87891 Personal history of nicotine dependence: Secondary | ICD-10-CM | POA: Diagnosis not present

## 2021-11-01 DIAGNOSIS — G4733 Obstructive sleep apnea (adult) (pediatric): Secondary | ICD-10-CM | POA: Diagnosis not present

## 2021-11-01 DIAGNOSIS — E119 Type 2 diabetes mellitus without complications: Secondary | ICD-10-CM | POA: Diagnosis not present

## 2021-11-01 DIAGNOSIS — F03A0 Unspecified dementia, mild, without behavioral disturbance, psychotic disturbance, mood disturbance, and anxiety: Secondary | ICD-10-CM | POA: Diagnosis not present

## 2021-11-01 DIAGNOSIS — K219 Gastro-esophageal reflux disease without esophagitis: Secondary | ICD-10-CM | POA: Insufficient documentation

## 2021-11-01 DIAGNOSIS — Z9889 Other specified postprocedural states: Secondary | ICD-10-CM | POA: Diagnosis not present

## 2021-11-01 DIAGNOSIS — K222 Esophageal obstruction: Secondary | ICD-10-CM | POA: Diagnosis not present

## 2021-11-01 DIAGNOSIS — R1110 Vomiting, unspecified: Secondary | ICD-10-CM | POA: Insufficient documentation

## 2021-11-01 DIAGNOSIS — E876 Hypokalemia: Secondary | ICD-10-CM | POA: Diagnosis not present

## 2021-11-01 DIAGNOSIS — Z9989 Dependence on other enabling machines and devices: Secondary | ICD-10-CM | POA: Diagnosis not present

## 2021-11-01 HISTORY — PX: ESOPHAGOGASTRODUODENOSCOPY: SHX5428

## 2021-11-01 HISTORY — PX: BALLOON DILATION: SHX5330

## 2021-11-01 LAB — GLUCOSE, CAPILLARY: Glucose-Capillary: 107 mg/dL — ABNORMAL HIGH (ref 70–99)

## 2021-11-01 SURGERY — EGD (ESOPHAGOGASTRODUODENOSCOPY)
Anesthesia: Monitor Anesthesia Care

## 2021-11-01 MED ORDER — METOCLOPRAMIDE HCL 5 MG PO TABS: 5.0000 mg | ORAL_TABLET | Freq: Three times a day (TID) | ORAL | 3 refills | Status: DC

## 2021-11-01 MED ORDER — PROPOFOL 500 MG/50ML IV EMUL
INTRAVENOUS | Status: AC
  Filled 2021-11-01: qty 100

## 2021-11-01 MED ORDER — PROPOFOL 500 MG/50ML IV EMUL
INTRAVENOUS | Status: DC | PRN
  Administered 2021-11-01: 125 ug/kg/min via INTRAVENOUS

## 2021-11-01 MED ORDER — LACTATED RINGERS IV SOLN: INTRAVENOUS | Status: DC

## 2021-11-01 MED ORDER — PROPOFOL 10 MG/ML IV BOLUS
INTRAVENOUS | Status: DC | PRN
  Administered 2021-11-01: 10 mg via INTRAVENOUS
  Administered 2021-11-01: 60 mg via INTRAVENOUS
  Administered 2021-11-01 (×2): 10 mg via INTRAVENOUS

## 2021-11-01 MED ORDER — SUCCINYLCHOLINE CHLORIDE 200 MG/10ML IV SOSY
PREFILLED_SYRINGE | INTRAVENOUS | Status: DC | PRN
  Administered 2021-11-01: 120 mg via INTRAVENOUS

## 2021-11-01 MED ORDER — SODIUM CHLORIDE 0.9 % IV SOLN: INTRAVENOUS | Status: DC

## 2021-11-01 NOTE — Discharge Instructions (Signed)
YOU HAD AN ENDOSCOPIC PROCEDURE TODAY: Refer to the procedure report and other information in the discharge instructions given to you for any specific questions about what was found during the examination. If this information does not answer your questions, please call [facility] office at [phone] to clarify.   YOU SHOULD EXPECT: Some feelings of bloating in the abdomen. Passage of more gas than usual. Walking can help get rid of the air that was put into your GI tract during the procedure and reduce the bloating. If you had a lower endoscopy (such as a colonoscopy or flexible sigmoidoscopy) you may notice spotting of blood in your stool or on the toilet paper. Some abdominal soreness may be present for a day or two, also.  DIET: Your first meal following the procedure should be a light meal and then it is ok to progress to your normal diet. A half-sandwich or bowl of soup is an example of a good first meal. Heavy or fried foods are harder to digest and may make you feel nauseous or bloated. Drink plenty of fluids but you should avoid alcoholic beverages for 24 hours. If you had a esophageal dilation, please see attached instructions for diet.    ACTIVITY: Your care partner should take you home directly after the procedure. You should plan to take it easy, moving slowly for the rest of the day. You can resume normal activity the day after the procedure however YOU SHOULD NOT DRIVE, use power tools, machinery or perform tasks that involve climbing or major physical exertion for 24 hours (because of the sedation medicines used during the test).   SYMPTOMS TO REPORT IMMEDIATELY: A gastroenterologist can be reached at any hour. Please call [phone]  for any of the following symptoms:   Following upper endoscopy (EGD, EUS, ERCP, esophageal dilation) Vomiting of blood or coffee ground material  New, significant abdominal pain  New, significant chest pain or pain under the shoulder blades  Painful or  persistently difficult swallowing  New shortness of breath  Black, tarry-looking or red, bloody stools  FOLLOW UP:  If any biopsies were taken you will be contacted by phone or by letter within the next 1-3 weeks. Call [phone]  if you have not heard about the biopsies in 3 weeks.  Please also call with any specific questions about appointments or follow up tests.  

## 2021-11-01 NOTE — Telephone Encounter (Signed)
-----   Message from Milus Banister, MD sent at 11/01/2021 10:59 AM EST ----- Anne Hahn, I repeated his EGD today.  See full report in epic.  - Tortuous esophagus, persistent black stitch material attached to the distal esophagus. - Large amount of retained solid gastric contents without anatomic outlet obstruction.  This suggest a significant amount of gastric dysmotility, perhaps related to his 3 upper GI surgeries.  I was unable to determine if he has recurrent hiatal hernia given the large amount of retained food in his stomach.  I dilated the GE junction region that was quite tortuous but not stenotic or strictured given his clinical improvement after dilation 3 months ago.  - I am becoming convinced that gastric dysmotility is playing a significant role in his ongoing symptoms.  He may also have small recurrent hiatal hernia despite 3 upper GI surgeries.  He does have retained stitch material in his distal esophagus, I am not sure if that is contributing to his problems.  I am going to start him on low-dose Reglan 3 times daily with meals and at bedtime.  I am also going to consider referring him to tertiary GI facility for their opinion.     Isaih Bulger, He needs OV with me in 5-6 weeks.  Thanks

## 2021-11-01 NOTE — Anesthesia Postprocedure Evaluation (Signed)
Anesthesia Post Note  Patient: Garrett [name]  Procedure(s) Performed: ESOPHAGOGASTRODUODENOSCOPY (EGD) BALLOON DILATION     Patient location during evaluation: PACU Anesthesia Type: General Level of consciousness: awake and alert Pain management: pain level controlled Vital Signs Assessment: post-procedure vital signs reviewed and stable Respiratory status: spontaneous breathing, nonlabored ventilation, respiratory function stable and patient connected to nasal cannula oxygen Cardiovascular status: blood pressure returned to baseline and stable Postop Assessment: no apparent nausea or vomiting Anesthetic complications: no   No notable events documented.  Last Vitals:  Vitals:   11/01/21 1100 11/01/21 1101  BP:  137/89  Pulse: 70 73  Resp: 14 14  Temp:    SpO2: 96% 96%    Last Pain:  Vitals:   11/01/21 1050  TempSrc: Axillary  PainSc:                  Mattisyn Cardona S

## 2021-11-01 NOTE — Anesthesia Procedure Notes (Addendum)
Procedure Name: MAC Date/Time: 11/01/2021 10:11 AM Performed by: Niel Hummer, CRNA Pre-anesthesia Checklist: Patient identified, Emergency Drugs available, Suction available and Patient being monitored Oxygen Delivery Method: Simple face mask

## 2021-11-01 NOTE — Telephone Encounter (Signed)
The pt has been scheduled for a follow up with Dr [name] on 12/28/21 at 81 am.  Sent to the pt My Chart and letter mailed to the pt home.

## 2021-11-01 NOTE — Anesthesia Preprocedure Evaluation (Signed)
Anesthesia Evaluation  Patient identified by MRN, date of birth, ID band Patient awake    Reviewed: Allergy & Precautions, NPO status , Patient's Chart, lab work & pertinent test results  Airway Mallampati: II  TM Distance: >3 FB Neck ROM: Full    Dental no notable dental hx.    Pulmonary sleep apnea and Continuous Positive Airway Pressure Ventilation , former smoker,    Pulmonary exam normal breath sounds clear to auscultation       Cardiovascular hypertension, Pt. on medications Normal cardiovascular exam Rhythm:Regular Rate:Normal     Neuro/Psych Dementia Mild dementianegative neurological ROS     GI/Hepatic Neg liver ROS, GERD  Medicated,  Endo/Other  diabetes, Type 2  Renal/GU negative Renal ROS  negative genitourinary   Musculoskeletal negative musculoskeletal ROS (+)   Abdominal   Peds negative pediatric ROS (+)  Hematology negative hematology ROS (+)   Anesthesia Other Findings   Reproductive/Obstetrics negative OB ROS                             Anesthesia Physical Anesthesia Plan  ASA: 3  Anesthesia Plan: MAC   Post-op Pain Management:    Induction: Intravenous  PONV Risk Score and Plan: 1 and Propofol infusion and Treatment may vary due to age or medical condition  Airway Management Planned: Nasal Cannula  Additional Equipment:   Intra-op Plan:   Post-operative Plan:   Informed Consent: I have reviewed the patients History and Physical, chart, labs and discussed the procedure including the risks, benefits and alternatives for the proposed anesthesia with the patient or authorized representative who has indicated his/her understanding and acceptance.     Dental advisory given  Plan Discussed with: CRNA and Surgeon  Anesthesia Plan Comments:         Anesthesia Quick Evaluation

## 2021-11-01 NOTE — Transfer of Care (Signed)
Immediate Anesthesia Transfer of Care Note  Patient: Evertt Chouinard Staff  Procedure(s) Performed: ESOPHAGOGASTRODUODENOSCOPY (EGD) BALLOON DILATION  Patient Location: PACU  Anesthesia Type:General  Level of Consciousness: awake  Airway & Oxygen Therapy: Patient Spontanous Breathing and Patient connected to face mask oxygen  Post-op Assessment: Report given to RN, Post -op Vital signs reviewed and stable and Patient moving all extremities X 4  Post vital signs: Reviewed and stable  Last Vitals:  Vitals Value Taken Time  BP 126/69   Temp    Pulse 76   Resp 14 11/01/21 1041  SpO2 100   Vitals shown include unvalidated device data.  Last Pain:  Vitals:   11/01/21 0935  TempSrc: Oral  PainSc: 0-No pain         Complications: No notable events documented.

## 2021-11-01 NOTE — Op Note (Signed)
[facility] Patient Name: Garrett [name] Procedure Date: 11/01/2021 MRN: [id number] Attending MD: Milus Banister , MD Date of Birth: 27-Feb-1951 CSN: [id number] Age: 70 Admit Type: Outpatient Procedure:                Upper GI endoscopy Indications:              Dysphagia, intermittent vomitting. History of GERD,                            fundoplication and TWO redo fundoplication's. Most                            recent redo fundoplication March 9622, dysphagia                            since then. EGD August 2022 found tortuous and                            foreshortened esophagus with what appeared to be                            recurrent 3 to 4 cm hiatal hernia segment as well                            as visible black suture material attached to the                            distal esophagus. The GE junction did not seem                            significantly stenotic however I dilated it up to                            20 mm with a TTS balloon. Barium esophagram                            September 2022 suggested "filling of the wrap and                            distention of the wrap suggesting some mild                            loosening of the fundoplication wrap which appears                            intact based upon its effect on the distal                            esophagus." No sign of gastroesophageal reflux. 10                            mm channel of the distal esophagus as measured on  current study with mild transient delay of the                            passage of the ingested barium tablet".                            Recurrent pneumonia, suspected aspiration                            pneumonia due to swallowing, vomiting difficulties. Providers:                Milus Banister, MD, Glori Bickers, RN, Frazier Richards, Technician Referring MD:              Medicines:                 General Anesthesia Complications:            No immediate complications. Estimated blood loss:                            None. Estimated Blood Loss:     Estimated blood loss: none. Procedure:                Pre-Anesthesia Assessment:                           - Prior to the procedure, a History and Physical                            was performed, and patient medications and                            allergies were reviewed. The patient's tolerance of                            previous anesthesia was also reviewed. The risks                            and benefits of the procedure and the sedation                            options and risks were discussed with the patient.                            All questions were answered, and informed consent                            was obtained. Prior Anticoagulants: The patient has                            taken Xarelto (rivaroxaban), last dose was 2 days  prior to procedure. ASA Grade Assessment: III - A                            patient with severe systemic disease. After                            reviewing the risks and benefits, the patient was                            deemed in satisfactory condition to undergo the                            procedure.                           After obtaining informed consent, the endoscope was                            passed under direct vision. Throughout the                            procedure, the patient's blood pressure, pulse, and                            oxygen saturations were monitored continuously. The                            [id number]-H190 ([id number] Olympus endoscope was introduced                            through the mouth, and advanced to the second part                            of duodenum. The upper GI endoscopy was                            accomplished without difficulty. The patient                            tolerated the procedure  well. Scope In: Scope Out: Findings:      The esophagus is fairly tortuous throughout and especially distally.       Again I noted visible black suture material connected to the distal       esophagus, the stitches about 5 to 6 cm long. There was a very large       amount of retained solid food in the stomach. Much of this was in the       proximal half of the stomach. After noticing this we elected to protect       his airway with endotracheal intubation to complete the procedure. There       was so much food that I could honestly not tell if there is recurrent       hiatal hernia. The distal esophagus is quite tortuous but not stenotic       or narrowed. Since he had relief from his  GE junction dilation 3 months       ago I repeated this dilation to 20 mm using a TTS balloon held inflated       for 1 minute. Impression:               - Tortuous esophagus, persistent black stitch                            material attached to the distal esophagus.                           - Large amount of retained solid gastric contents                            without anatomic outlet obstruction. This suggest a                            significant amount of gastric dysmotility, perhaps                            related to his 3 upper GI surgeries. I was unable                            to determine if he has recurrent hiatal hernia                            given the large amount of retained food in his                            stomach. I dilated the GE junction region that was                            quite tortuous but not stenotic or strictured given                            his clinical improvement after dilation 3 months                            ago.                           - I am becoming convinced that gastric dysmotility                            is playing a significant role in his ongoing                            symptoms. He may also have small recurrent hiatal                             hernia despite 3 upper GI surgeries. He does have                            retained stitch  material in his distal esophagus, I                            am not sure if that is contributing to his                            problems. I am going to start him on low-dose                            Reglan 3 times daily with meals and at bedtime. I                            am also going to consider referring him to tertiary                            GI facility for their opinion. Moderate Sedation:      Not Applicable - Patient had care per Anesthesia. Recommendation:           - Patient has a contact number available for                            emergencies. The signs and symptoms of potential                            delayed complications were discussed with the                            patient. Return to normal activities tomorrow.                            Written discharge instructions were provided to the                            patient.                           - Reglan 5mg  pills, one pill with every meal and 1                            pill at bedtime.                           - Office visit follow up with Dr. [name] in 5-6                            weeks. Procedure Code(s):        --- Professional ---                           [phone], Esophagogastroduodenoscopy, flexible,                            transoral; with transendoscopic balloon dilation of  esophagus (less than 30 mm diameter) Diagnosis Code(s):        --- Professional ---                           K22.2, Esophageal obstruction                           R13.10, Dysphagia, unspecified CPT copyright 2019 [employer]. All rights reserved. The codes documented in this report are preliminary and upon coder review may  be revised to meet current compliance requirements. Milus Banister, MD 11/01/2021 10:52:52 AM This report has been signed  electronically. Number of Addenda: 0

## 2021-11-01 NOTE — Interval H&P Note (Signed)
History and Physical Interval Note:  11/01/2021 9:25 AM  Garrett [name]  has presented today for surgery, with the diagnosis of dysphagia.  The various methods of treatment have been discussed with the patient and family. After consideration of risks, benefits and other options for treatment, the patient has consented to  Procedure(s): ESOPHAGOGASTRODUODENOSCOPY (EGD) (N/A) as a surgical intervention.  The patient's history has been reviewed, patient examined, no change in status, stable for surgery.  I have reviewed the patient's chart and labs.  Questions were answered to the patient's satisfaction.     Milus Banister

## 2021-11-01 NOTE — Anesthesia Procedure Notes (Addendum)
Procedure Name: Intubation Date/Time: 11/01/2021 10:27 AM Performed by: Niel Hummer, CRNA Pre-anesthesia Checklist: Patient identified, Emergency Drugs available, Suction available and Patient being monitored Patient Re-evaluated:Patient Re-evaluated prior to induction Oxygen Delivery Method: Circle system utilized Preoxygenation: Pre-oxygenation with 100% oxygen Induction Type: IV induction and Rapid sequence Laryngoscope Size: Mac and 4 Grade View: Grade I Tube type: Oral Tube size: 7.5 mm Number of attempts: 1 Airway Equipment and Method: Stylet Placement Confirmation: ETT inserted through vocal cords under direct vision, positive ETCO2 and breath sounds checked- equal and bilateral Secured at: 23 cm Tube secured with: Tape Dental Injury: Teeth and Oropharynx as per pre-operative assessment

## 2021-11-02 ENCOUNTER — Encounter (HOSPITAL_COMMUNITY): Payer: Self-pay | Admitting: Gastroenterology

## 2021-11-02 NOTE — Telephone Encounter (Signed)
Patient was provided with a sample of Ozempic #1.  Will have CMA check with [employer] to verify shipment. Our office nor patient has received the initial shipment of [place].

## 2021-11-05 ENCOUNTER — Ambulatory Visit (INDEPENDENT_AMBULATORY_CARE_PROVIDER_SITE_OTHER): Payer: Medicare Other | Admitting: Pharmacist

## 2021-11-05 DIAGNOSIS — E1165 Type 2 diabetes mellitus with hyperglycemia: Secondary | ICD-10-CM

## 2021-11-05 DIAGNOSIS — E785 Hyperlipidemia, unspecified: Secondary | ICD-10-CM

## 2021-11-05 DIAGNOSIS — E1169 Type 2 diabetes mellitus with other specified complication: Secondary | ICD-10-CM

## 2021-11-05 NOTE — Patient Instructions (Signed)
Visit Information  PATIENT GOALS:  Goals Addressed             This Visit's Progress    Chronic Care Management Pharmacy Care Plan   On track    CARE PLAN ENTRY (see longitudinal plan of care for additional care plan information)  Current Barriers:  Chronic Disease Management support, education, and care coordination needs related to type 2 diabetes, elevated cholesterol, heart failure and hypertension. History of pulmonary embolism,  Asthma, reflux, Migraine, Allergies, Gout, Pain, Vertigo   Hypertension BP Readings from Last 3 Encounters:  11/01/21 137/89  10/16/21 126/64  09/21/21 110/70  Pharmacist Clinical Goal(s): Over the next 90 days, patient will work with PharmD and providers to maintain BP goal <140/90 Current regimen:  Verapamil 240mg  daily at bedtime Metoprolol tartrate 50mg  twice a day Interventions: Discussed blood pressure goal Updated medication list to reflect increase in metoprolol after last visit with cardiology  Patient self care activities - Over the next 90 days, patient will: Check blood pressure 2 to 3 time per week, document, and provide at future appointments Ensure daily salt intake < 2300 mg/day  Hyperlipidemia Lab Results  Component Value Date/Time   LDLCALC 84 05/11/2021 09:55 AM   LDLCALC 129 (H) 08/17/2020 09:48 AM   LDLCALC 99 07/18/2015 07:44 AM   LDLDIRECT 104.0 02/14/2020 10:25 AM  Pharmacist Clinical Goal(s): Over the next 90 days, patient will work with PharmD and providers to achieve LDL goal < 70 Current regimen:  Atorvastatin 80mg  daily  Fenofibrate 160mg  daily Interventions: Discussed LDL goal Patient self care activities - Over the next 90 days, patient will: Maintain cholesterol medication regimen.  Diabetes Lab Results  Component Value Date/Time   HGBA1C 7.5 (H) 08/17/2021 05:29 AM   HGBA1C 7.5 (H) 05/11/2021 09:55 AM  Pharmacist Clinical Goal(s): Over the next 90 days, patient will work with PharmD and providers  to achieve A1c goal <7% Current regimen:  Metformin 500mg  twice a day with food Ozampic 0.25mg  once per week Interventions: Discussed A1c goals Approved for [employer] patient assistance. Called to see about shipment and after speaking with representative our office received shipment 09/20/2021 per their records. No notation of shipment in office records. I did provide patient with 2 samples of Ozempic from office supply. He should be able to request another shipment of Ozempic in December. Also needs to reapply for patient assistance program for 2023. Application has been mailed to patient. Reviewed home blood glucose readings and reviewed goals  Fasting blood glucose goal (before meals) = 80 to 130 Blood glucose goal after a meal = less than 180  Patient self care activities - Over the next 90 days, patient will: Check blood sugar once daily, document, and provide at future appointments Continue Ozempic 0.25mg  inject subcutaneously once a week  Contact provider with any episodes of hypoglycemia  History of Pulmonary embolism (blood clot)  Pharmacist Clinical Goal(s) Over the next 90 days, patient will work with PharmD and providers to prevent recurrence of blood clots and prevent stroke Current regimen:  Xarelto 20mg  daily with supper Interventions: Screened for patient assistance program (unfortunately patient did not qualify this year)  Patient self care activities - Over the next 90 days, patient will: Continue to take Xarelto daily  Asthma: Pharmacist Clinical Goal(s) Over the next 90 days, patient will work with PharmD and providers to improve breathing and prevent hospitalizations related to asthma exacerbations Current regimen:  Symbicort 80/4.25mcg - inhaler 2 puffs into lungs once  a day Albuterol inhaler or nebulization solution - use as needed for shortness of breath or wheezing Interventions: Discussed maintenance versus rescue therapy.  Discussed seasonal as  the triggers  Patient self care activities - Over the next 90 days, patient will: Continue to follow up with Dr Melvyn Novas  Continue above medication regimen.   Medication management Pharmacist Clinical Goal(s): Over the next 90 days, patient will work with PharmD and providers to maintain optimal medication adherence Current pharmacy: [facility] Interventions Comprehensive medication review performed. Performed review of medications from recent hospitalization and discharge summary.  Continue current medication management strategy Patient self care activities - Over the next 90 days, patient will: Focus on medication adherence by filling and taking medications appropriately  Take medications as prescribed Report any questions or concerns to PharmD and/or provider(s)  Please see past updates related to this goal by clicking on the "Past Updates" button in the selected goal          Patient verbalizes understanding of instructions provided today and agrees to view in [place].   Telephone follow up appointment with care management team member scheduled for: December 2022  Cherre Robins, PharmD Clinical Pharmacist [place] [phone]

## 2021-11-05 NOTE — Chronic Care Management (AMB) (Signed)
Chronic Care Management Pharmacy Note  11/05/2021 Name:  Garrett [name] MRN:  [id number] DOB:  [birth year]  Subjective: Garrett [name] is an 70 y.o. year old male who is a primary patient of Ann Held, DO.  The CCM team was consulted for assistance with disease management and care coordination needs.    Engaged with patient by telephone for follow up visit in response to provider referral for pharmacy case management and/or care coordination services.   Consent to Services:  The patient was given information about Chronic Care Management services, agreed to services, and gave verbal consent prior to initiation of services.  Please see initial visit note for detailed documentation.   Patient Care Team: Carollee Herter, Alferd Apa, DO as PCP - General (Family Medicine) Milus Banister, MD (Gastroenterology) [name], MD (Neurosurgery) Calvert Cantor, MD as Consulting Physician (Ophthalmology) Tanda Rockers, MD as Consulting Physician (Pulmonary Disease) Cameron Sprang, MD as Consulting Physician (Neurology) [name], MD as Consulting Physician (Neurosurgery) Marlaine Hind, MD as Consulting Physician (Physical Medicine and Rehabilitation) Cherre Robins, RPH-CPP (Pharmacist) Ralene Ok, MD as Consulting Physician (General Surgery)  Recent office visits: 05/11/21 PCP (Dr. Carollee Herter)- Annual visit. Start farxiga 5 mg.  Recent consult visits: 09/28/2021 - Heart and [facility]acular (Dr Lamonte Sakai - [facility]) Seen for f/u CAD. No med changes. F/U 6 months. 09/25/2021 - Surgeon (Dr [name] - [facility]) Seen for hiatal hernia. Patient would like to avoid surgery if possible. Suggested esophageal dilation after appt with Dr [name] / GI.  09/21/2021 - Pumlonary Warner Mccreedy, NP) F/U cough variant asthma. Recommended chest eray in 1 to 2 weeks. No medication changes.  09/12/2021 - Podiatry (Dr Prudence Davidson) diabetic foot care - debrided nails. No med changes.   07/05/2021 GI Berniece Pap, NP) Seen for dysphagia. Stopped pantoprazole due to patient preference.  06/27/21 - Audiology (Dr Rachell Cipro) Replace left hearing aid; 06/27/21 Neuro (Dr Delice Lesch) neuropathy and migraines. No med changes - Referral to PT for right foot drop. F/U 1 year. 06/15/21 - Phone Call - PA for atrovastatin 75m - take 2 tablets daily. quantity limit and per insurance. Changed to 812mdaily.   Hospital visits: Performed medication review today to reconcile discharge medications and medication list in Epic.   08/14/2021 to 08/20/2021 [facility] sided [facility].  Medications Started at Discharge: Augmentin 87553m take 1 tablet every 12 hours for 4 days.  guaiFENesin-dextromethorphan (ROBITUSSIN DM) syrup - Take 5 mLs by mouth every 4 (four) hours as needed for cough magic mouthwash -Take 5 mLs by mouth 3 (three) times daily as needed for up to 5 days for mouth pain.,  Medications Stopped at Discharge - None Medications changeds at Discharge - None  08/14/2021 - GI (Dr JacArdis Hughsolonoscopy and EGD. Noted recurrent hiatal hernia. Dilated GE gunction. Two polyps removed. Noted to have diverticula. 02/20/2021 - to 02/27/2021 Hospital admission for hital hernia. No medication changes noted at discharge  Objective:  Lab Results  Component Value Date   CREATININE 1.41 08/30/2021   CREATININE 1.11 08/19/2021   CREATININE 1.03 08/18/2021    Lab Results  Component Value Date   HGBA1C 7.5 (H) 08/17/2021   Last diabetic Eye exam:  Lab Results  Component Value Date/Time   HMDIABEYEEXA No Retinopathy 12/21/2018 12:34 PM    Last diabetic Foot exam: No results found for: HMDIABFOOTEX      Component Value Date/Time  CHOL 152 05/11/2021 0955   CHOL 162 07/18/2015 0744   TRIG 131.0 05/11/2021 0955   TRIG 110 07/18/2015 0744   HDL 41.30 05/11/2021 0955   HDL 41 07/18/2015 0744   CHOLHDL 4 05/11/2021 0955   VLDL  26.2 05/11/2021 0955   LDLCALC 84 05/11/2021 0955   LDLCALC 129 (H) 08/17/2020 0948   LDLCALC 99 07/18/2015 0744   LDLDIRECT 104.0 02/14/2020 1025    Hepatic Function Latest Ref Rng & Units 08/30/2021 08/15/2021 08/14/2021  Total Protein 6.0 - 8.3 g/dL 7.2 5.5(L) 6.2(L)  Albumin 3.5 - 5.2 g/dL 4.1 2.9(L) 3.3(L)  AST 0 - 37 U/L 29 28 51(H)  ALT 0 - 53 U/L 43 39 52(H)  Alk Phosphatase 39 - 117 U/L 60 39 44  Total Bilirubin 0.2 - 1.2 mg/dL 0.6 0.8 0.7  Bilirubin, Direct 0.0 - 0.3 mg/dL - - -    Lab Results  Component Value Date/Time   TSH 1.30 04/28/2018 10:16 AM   TSH 1.24 02/03/2017 10:50 AM    CBC Latest Ref Rng & Units 08/30/2021 08/17/2021 08/16/2021  WBC 4.0 - 10.5 K/uL 7.0 10.0 12.4(H)  Hemoglobin 13.0 - 17.0 g/dL 12.8(L) 10.4(L) 11.1(L)  Hematocrit 39.0 - 52.0 % 39.8 33.3(L) 34.6(L)  Platelets 150.0 - 400.0 K/uL 362.0 229 224    No results found for: VD25OH  Clinical ASCVD: Yes  The 10-year ASCVD risk score (Arnett DK, et al., 2019) is: 40.7%   Values used to calculate the score:     Age: 47 years     Sex: Male     Is Non-Hispanic African American: No     Diabetic: Yes     Tobacco smoker: No     Systolic Blood Pressure: 540 mmHg     Is BP treated: Yes     HDL Cholesterol: 41.3 mg/dL     Total Cholesterol: 152 mg/dL      Social History   Tobacco Use  Smoking Status Former   Packs/day: 3.00   Years: 30.00   Pack years: 90.00   Types: Cigarettes   Quit date: 01/09/1991   Years since quitting: 30.8  Smokeless Tobacco Former   Types: Snuff   BP Readings from Last 3 Encounters:  11/01/21 137/89  10/16/21 126/64  09/21/21 110/70   Pulse Readings from Last 3 Encounters:  11/01/21 73  10/16/21 75  09/21/21 80   Wt Readings from Last 3 Encounters:  11/01/21 206 lb (93.4 kg)  10/16/21 213 lb (96.6 kg)  09/21/21 211 lb 3.2 oz (95.8 kg)    Assessment: Review of patient past medical history, allergies, medications, health status, including review of  consultants reports, laboratory and other test data, was performed as part of comprehensive evaluation and provision of chronic care management services.   SDOH:  (Social Determinants of Health) assessments and interventions performed:      CCM Care Plan  Allergies  Allergen Reactions   Bee Venom Anaphylaxis   Garlic Swelling    Medications Reviewed Today     Reviewed by Vladimir Creeks, RN (Registered Nurse) on 11/01/21 at [id number]  Med List Status: Complete   Medication Order Taking? Sig Documenting Provider Last Dose Status Informant  albuterol (PROVENTIL) (2.5 MG/3ML) 0.083% nebulizer solution [id number] No Take 3 mLs (2.5 mg total) by nebulization every 6 (six) hours as needed for wheezing or shortness of breath. [name] R, DO Unknown Active Self  allopurinol (ZYLOPRIM) 100 MG tablet [id number] Yes TAKE 1 TABLET BY MOUTH  DAILY Ann Held, DO 11/01/2021 Active Self  atorvastatin (LIPITOR) 80 MG tablet [id number] Yes Take 80 mg by mouth every evening. [provider] 10/31/2021 Active Self  budesonide-formoterol (SYMBICORT) 80-4.5 MCG/ACT inhaler [id number] Yes Inhale 2 puffs into the lungs daily. [provider] 11/01/2021 Active Self  celecoxib (CELEBREX) 200 MG capsule [id number] Yes TAKE 1 CAPSULE BY MOUTH  TWICE DAILY Ann Held, DO 11/01/2021 Active Self  EPINEPHrine 0.3 mg/0.3 mL IJ SOAJ injection [id number] No Inject 0.3 mg into the muscle as needed for anaphylaxis.  [provider] Unknown Active Self  famotidine (PEPCID) 20 MG tablet [id number] Yes Take 1 tablet (20 mg total) by mouth at bedtime. Milus Banister, MD 10/31/2021 Active Self  fenofibrate 160 MG tablet [id number] Yes TAKE 1 TABLET BY MOUTH  DAILY  Patient taking differently: Take 160 mg by mouth at bedtime.   [name] R, DO 10/31/2021 Active   Ferrous Sulfate (IRON PO) [id number] Yes Take 1 tablet by mouth daily. [provider] 11/01/2021  Active Self  fluticasone (FLONASE) 50 MCG/ACT nasal spray [id number] Yes Place 2 sprays into both nostrils daily. [name] R, DO 11/01/2021 Active Self  furosemide (LASIX) 40 MG tablet [id number] Yes Take 40 mg by mouth See admin instructions. 60m in the morning  20 mg in evening [provider] 10/31/2021 Active Self  gabapentin (NEURONTIN) 100 MG capsule [id number] TAKE 2 CAPSULES BY MOUTH AT BEDTIME SLyndal Pulley DO 10/31/2021 Active Self  levocetirizine (XYZAL) 5 MG tablet [id number] Take 1 tablet (5 mg total) by mouth every evening. LRoma SchanzR, DO 10/31/2021 Active Self  magnesium oxide (MAG-OX) 400 (241.3 Mg) MG tablet [id number] Take 1 tablet (400 mg total) by mouth daily. AReyne Dumas MD 10/31/2021 Active Self  meclizine (ANTIVERT) 25 MG tablet [id number] Take 1 tablet (25 mg total) by mouth 3 (three) times daily as needed for dizziness. LAnn Held DO Past Month Active Self  memantine (NAMENDA) 10 MG tablet [id number] TAKE 2 TABLETS BY MOUTH  DAILY LAnn Held DO 11/01/2021 Active Self  metFORMIN (GLUCOPHAGE) 500 MG tablet [id number] TAKE 1 TABLET BY MOUTH  TWICE DAILY WITH A MEAL LAnn Held DO 10/31/2021 Active Self  metoprolol tartrate (LOPRESSOR) 50 MG tablet [id number] Take 50 mg by mouth 2 (two) times daily. [provider] 11/01/2021 Active Self  Multiple Vitamins-Minerals (ONE-A-DAY WEIGHT SMART ADVANCE PO) [id number] Take 1 tablet by mouth daily. Centrum Silver [provider] 10/31/2021 Active Self  nitroGLYCERIN (NITROSTAT) 0.4 MG SL tablet [id number] Place 0.4 mg under the tongue every 5 (five) minutes as needed for chest pain. [provider] Unknown Active Self  omeprazole (PRILOSEC) 40 MG capsule [id number] Take 40 mg by mouth 2 (two) times daily. [provider] 11/01/2021 Active Self  ONETOUCH ULTRA test strip [id number] USE AS DIRECTED THREE TIMES  DAILY. LAnn Held DO  Active Self  potassium chloride SA (KLOR-CON) 20 MEQ tablet [id number] TAKE 2 TABLETS BY MOUTH  DAILY  Patient taking differently: Take 20 mEq by mouth 2 (two) times daily.   LCarollee Herter YAlferd Apa DO 11/01/2021 Active   PROAIR HFA 108 (90 Base) MCG/ACT inhaler [id number] Inhale 1 puff into the lungs every 6 (six) hours as needed for wheezing or shortness of breath. LRoma SchanzR, DO Unknown Active Self  Semaglutide,0.25 or 0.5MG/DOS, (OZEMPIC, 0.25 OR  0.5 MG/DOSE,) 2 MG/1.5ML SOPN [id number] Yes Inject 0.25 mg into the skin every Sunday. [provider] Past Week Active Self  SUMAtriptan (IMITREX) 50 MG tablet [id number] Yes Take 1 tablet as needed for migraine/vertigo. Do not take more than 3 a week  Patient taking differently: Take 50 mg by mouth every 2 (two) hours as needed for migraine. Max 3 tabs per week   Carollee Herter, Alferd Apa, DO Past Month Active   tadalafil (CIALIS) 20 MG tablet [id number] Yes Take 0.5-1 tablets (10-20 mg total) by mouth every other day as needed for erectile dysfunction. Ann Held, DO Past Month Active Self  topiramate (TOPAMAX) 50 MG tablet [id number] Yes Take 1 tablet (50 mg total) by mouth 2 (two) times daily. Cameron Sprang, MD 11/01/2021 Active Self  verapamil (CALAN-SR) 240 MG CR tablet [id number] Yes TAKE 1 TABLET BY MOUTH AT  BEDTIME Ann Held, DO 10/31/2021 Active Self  vitamin B-12 (CYANOCOBALAMIN) 1000 MCG tablet [id number] Yes Take 1,000 mcg by mouth daily. [provider] 10/31/2021 Active Self  XARELTO 20 MG TABS tablet [id number] Yes TAKE 1 TABLET BY MOUTH  DAILY WITH SUPPER Tanda Rockers, MD 10/29/2021 Active Self            Patient Active Problem List   Diagnosis Date Noted   Severe sepsis ([place]) 08/14/2021   Pneumonia 08/14/2021   Lactic acidosis 08/14/2021   Hypokalemia 08/14/2021   DOE (dyspnea on exertion) 05/11/2021   Abnormal stress test 04/20/2021   Acute  respiratory distress    Acute respiratory failure with hypoxia (HCC)    S/P Nissen fundoplication (without gastrostomy tube) procedure 02/20/2021   Body mass index (BMI) 33.0-33.9, adult 11/03/2019   Laceration of right hand 08/02/2019   Acute pulmonary embolism ([place]) 01/07/2019   Chronic diastolic CHF (congestive heart failure) ([place]) 01/07/2019   Preventative health care 02/10/2018   Dizziness 02/10/2018   Spondylolisthesis at L3-L4 level 10/28/2017   Cough variant asthma  vs uacs/ pseudoasthma 06/17/2017   Pneumonia of right middle lobe due to infectious organism 02/28/2017   Sepsis ([place]) 02/28/2017   Pulmonary embolism and infarction ([place]) 02/09/2017   Bilateral pulmonary embolism ([place]) 02/04/2017   Greater trochanteric bursitis of left hip 01/14/2017   Greater trochanteric bursitis of right hip 12/20/2016   Degenerative arthritis of knee, bilateral 10/08/2016   Upper airway cough syndrome 03/22/2016   Multiple pulmonary nodules 03/22/2016   Acute bronchitis 01/22/2016   Headache disorder 01/04/2016   Acute upper respiratory infection 10/25/2015   Elevated CK 07/18/2015   History of colonic polyps 04/11/2015   Lumbar stenosis with neurogenic claudication 11/14/2014   Spondylolysis of cervical region 02/25/2014   Lumbosacral spondylosis without myelopathy 01/06/2014   OSA (obstructive sleep apnea) 12/08/2013   SOB (shortness of breath) on exertion 11/04/2013   Morbid obesity due to excess calories ([place])    Cervicalgia    Venous insufficiency of leg 02/18/2012   Itching 02/18/2012   Mild dementia 05/28/2011   Hyperlipidemia associated with type 2 diabetes mellitus ([place]) 05/27/2011   Controlled type 2 diabetes mellitus with diabetic nephropathy ([place]) 04/10/2011   Gout 04/10/2011   Essential hypertension 04/10/2011   OA (osteoarthritis) 04/10/2011   GERD (gastroesophageal reflux disease) 04/10/2011   Migraine headache without aura 04/10/2011   Allergic rhinitis, cause  unspecified 04/10/2011   Bee sting allergy 04/10/2011    Immunization History  Administered Date(s) Administered   Fluad Quad(high Dose 65+) 10/07/2019, 10/19/2020, 08/30/2021   Influenza  Split 09/30/2012   Influenza, High Dose Seasonal PF 09/19/2016, 10/07/2017, 10/16/2018   Influenza,inj,Quad PF,6+ Mos 09/15/2013, 09/26/2014, 09/22/2015   PFIZER(Purple Top)SARS-COV-2 Vaccination 02/12/2020, 03/07/2020, 08/19/2020   Pneumococcal Conjugate-13 07/18/2015   Pneumococcal Polysaccharide-23 04/10/2011, 09/19/2016   Tdap 04/10/2011, 04/09/2019, 07/22/2019   Zoster, Live 03/28/2014    Conditions to be addressed/monitored: CHF, CAD, HTN, HLD, Hypertriglyceridemia, DMII, and Asthma, neuropathy, migraine headaches, GERD, gout, mild anemia, OSA; recurrent PE with chronic anticoagulation  Care Plan : General Pharmacy (Adult)  Updates made by Cherre Robins, RPH-CPP since 11/05/2021 12:00 AM     Problem: asthma, hyperlipidemia; type 2 DM; CAD; CHF; HTN; recurrent PE; chronic anticoagulation; migraines; neuropathy; gout; GERD; mild dementia; vertigo; OSA; seasonal allergies   Priority: High  Onset Date: 08/07/2021  Note:   Current Barriers:  Unable to maintain control of type 2 DM Does not adhere to prescribed medication regimen Education needed regarding medication regimen and chronic conditions  Pharmacist Clinical Goal(s):  Over the next 90 days, patient will achieve adherence to monitoring guidelines and medication adherence to achieve therapeutic efficacy achieve control of type 2 DM as evidenced by A1c < 7.0 maintain control of HTN, CHF, hyperlipidemia and asthma  as evidenced by attainment of goals listed below  through collaboration with PharmD and provider.   Interventions: 1:1 collaboration with Carollee Herter, Alferd Apa, DO regarding development and update of comprehensive plan of care as evidenced by provider attestation and co-signature Inter-disciplinary care team collaboration (see  longitudinal plan of care) Comprehensive medication review performed; medication list updated in electronic medical record    Hypertension / CHF:  BP Readings from Last 3 Encounters:  07/18/21 123/85  07/05/21 124/72  06/27/21 131/79  Controlled; Goal: BP <140/90, minimize symptoms of CHF and prevent CHF exacerbations Current regimen:  Verapamil 264m daily at bedtime Metoprolol tartrate 538mtwice a day Furosemide 4060mach morning and 13m62mch evening Potassium chloride 13mE94mtake 2 tablets daily Type: Diastolic Last ejection fraction: 03/03/17 EF: 65-70% NYHA Class: II (slight limitation of activity) AHA HF Stage: B (Heart disease present - no symptoms present) Patient has failed these meds in past: None noted  Last BNP was 123 (02/26/2021) Home blood pressure readings:  120-130 / 70-80 Interventions: (addressed at previous visit)  Discussed blood pressure goal Check blood pressure 2 to 3 time per week, document, and provide at future appointments Ensure daily salt intake < 2300 mg/day  Hyperlipidemia / CAD;  Lab Results  Component Value Date/Time   LDLCALC 84 05/11/2021 09:55 AM   LDLCALC 129 (H) 08/17/2020 09:48 AM   LDLCALC 99 07/18/2015 07:44 AM   LDLDIRECT 104.0 02/14/2020 10:25 AM  Not at goal; LDL goal < 70 Current regimen:  Atorvastatin 80mg 55my (dose increased 05/11/2021) Fenofibrate 160mg d19m Interventions: Discussed LDL goal Maintain cholesterol medication regimen - expect LDL improved with higher atorvastatin dose.   Diabetes Lab Results  Component Value Date/Time   HGBA1C 7.5 (H) 05/11/2021 09:55 AM   HGBA1C 6.8 (H) 02/22/2021 04:03 AM  A1c increasing;  A1c goal <7.0 Checks BG: Daily Recent FBG Readings: 110 to 135 Denie s/sx of hypoglycemia.  Current regimen:  Metformin 500mg tw79ma day with food Ozempic 0.25mg inj1md once per week  Previous medications tried: metformin 1000mg bid 22m side effects; Victoza daily - stopped after weight  loss and improvement in A1c.  Due to recent hospitalization patient started Ozempic 0.25mg Sunda60m/03/2021. Reports he tolerated well with no nausea.  Has noted decreased in appetite  and weight has decreased from around 224 in July 2022 to 212lbs last week.  Applied for [employer] medication assistance and was approved.   Interventions: Discussed A1c goals Continue Ozempic 0.59m SQ weekly - he has been approved for [employer] thru 11/30 2022. I called to see about shipment and after speaking with representative our office received shipment 09/20/2021 per their records. No notation of shipment in office records. I did provide patient with 2 samples of Ozempic from office supply. He should be able to request another shipment of Ozempic in December. Also needs to reapply for patient assistance program for 2023. Application has been mailed to patient. Reviewed home blood glucose readings and reviewed goals  Fasting blood glucose goal (before meals) = 80 to 130 Blood glucose goal after a meal = less than 180  Patient self care activities - Over the next 90 days, patient will: Check blood sugar once daily, document, and provide at future appointments Continue Ozempic 0.275minject subcutaneously once a week  History of Pulmonary embolism (blood clot)  Controlled; Goal: prevent recurrence of blood clots and prevent stroke Current regimen:  Xarelto 2073maily with supper Interventions: (addressed at previous visit)  Screened for patient assistance program (unfortunately patient did not qualify this year)  Continue to take Xarelto daily   Asthma: Controlled; Goal: improve breathing and prevent hospitalizations related to asthma exacerbations Current regimen:  Symbicort 80/4.5mc74m inhaler 2 puffs into lungs once a day (during allergy season) Albuterol inhaler or nebulization solution - use as needed for shortness of breath or wheezing Patient has failed these meds in past: Trelegy   Using maintenance inhaler regularly? Yes during allergy season (Spring and Fall)  Frequency of rescue inhaler use: rarely uses current but during allergy season 1 or 2 time per week Managed by pulmonology - Dr. WertMelvyn Novaserventions: Discussed maintenance versus rescue therapy.  Discussed seasonal triggers  Continue to follow up with Dr WertMelvyn Novasntinue above medication regimen.   Medication management Pharmacist Clinical Goal(s): Over the next 90 days, patient will work with PharmD and providers to maintain optimal medication adherence Current pharmacy: [facility] Interventions Comprehensive medication review performed. Performed review of medications from recent hospitalization and discharge summary. Patient has completed antibiotic therapy and is not longer in need of Robitussin or Magic Mouthwash prescribed at discharge. Removed from medication list.  Continue current medication management strategy Focus on medication adherence by filling and taking medications appropriately  Take medications as prescribed Report any questions or concerns to PharmD and/or provider(s)  Patient Goals/Self-Care Activities Over the next 90 days, patient will:  take medications as prescribed,  check glucose daily, document, and provide at future appointments,  collaborate with provider on medication access solutions.  Follow Up Plan: Telephone follow up appointment with care management team member scheduled for:  patient will see PCP in November. Clinical pharmacist will follow up in December.        Medication Assistance: Patient is in Medicare coverage gap and cost of Symbicort, FarxWilder Glade Xarelto has increased.Patient states assistance would be helpful but he can afford to continue these medications even if he dose not qualify. During 08/08/2021 phone visit performed review of patient assistance program qualifications and unfortunately patient does not currently qualify for patient  assistance of the above mention  Patient has been approved for [employer] PAP for Ozempic through 11/21/2021. First fill shipped 9/28 and received per [employer] Records in our office 09/20/2021. It looks like Ozempic was  placed in regular stock - patient did get 1 of 5 Ozempic pens in October. Provided 2 more pens from our sample stock to make up for misplaced shipment. Can request next Ozempic shipment in December - provided application for [id number].    Patient's preferred pharmacy is:  [facility], [place] - [address] [place] [address] Phone: [phone] Fax: [phone]  [facility] ([place], [place] [facility] [address] [place] [address] Phone: [phone] Fax: [phone]  [facility] ([facility]) - [place], [address] [place] [address] Phone: [phone] Fax: [phone]  Uses pill box? Yes Pt endorses 99% compliance  Follow Up:  Patient agrees to Care Plan and Follow-up.  Plan: Telephone follow up appointment with care management team member scheduled for:  December 2022; Will see PCP in November 2022.   Cherre Robins, PharmD Clinical Pharmacist [facility]

## 2021-11-12 ENCOUNTER — Other Ambulatory Visit: Payer: Self-pay

## 2021-11-12 ENCOUNTER — Encounter: Payer: Self-pay | Admitting: Family Medicine

## 2021-11-12 ENCOUNTER — Ambulatory Visit (INDEPENDENT_AMBULATORY_CARE_PROVIDER_SITE_OTHER): Payer: Medicare Other | Admitting: Family Medicine

## 2021-11-12 VITALS — BP 118/88 | HR 73 | Temp 98.7°F | Resp 18 | Ht 69.0 in | Wt 217.0 lb

## 2021-11-12 DIAGNOSIS — I10 Essential (primary) hypertension: Secondary | ICD-10-CM

## 2021-11-12 DIAGNOSIS — E785 Hyperlipidemia, unspecified: Secondary | ICD-10-CM

## 2021-11-12 DIAGNOSIS — E1169 Type 2 diabetes mellitus with other specified complication: Secondary | ICD-10-CM

## 2021-11-12 DIAGNOSIS — E1121 Type 2 diabetes mellitus with diabetic nephropathy: Secondary | ICD-10-CM

## 2021-11-12 DIAGNOSIS — E1165 Type 2 diabetes mellitus with hyperglycemia: Secondary | ICD-10-CM

## 2021-11-12 LAB — CBC WITH DIFFERENTIAL/PLATELET
Basophils Absolute: 0 10*3/uL (ref 0.0–0.1)
Basophils Relative: 0.4 % (ref 0.0–3.0)
Eosinophils Absolute: 0.1 10*3/uL (ref 0.0–0.7)
Eosinophils Relative: 1.7 % (ref 0.0–5.0)
HCT: 36.6 % — ABNORMAL LOW (ref 39.0–52.0)
Hemoglobin: 12 g/dL — ABNORMAL LOW (ref 13.0–17.0)
Lymphocytes Relative: 17.2 % (ref 12.0–46.0)
Lymphs Abs: 1.3 10*3/uL (ref 0.7–4.0)
MCHC: 32.8 g/dL (ref 30.0–36.0)
MCV: 87.1 fl (ref 78.0–100.0)
Monocytes Absolute: 0.6 10*3/uL (ref 0.1–1.0)
Monocytes Relative: 7.9 % (ref 3.0–12.0)
Neutro Abs: 5.4 10*3/uL (ref 1.4–7.7)
Neutrophils Relative %: 72.8 % (ref 43.0–77.0)
Platelets: 261 10*3/uL (ref 150.0–400.0)
RBC: 4.2 Mil/uL — ABNORMAL LOW (ref 4.22–5.81)
RDW: 17.6 % — ABNORMAL HIGH (ref 11.5–15.5)
WBC: 7.4 10*3/uL (ref 4.0–10.5)

## 2021-11-12 LAB — COMPREHENSIVE METABOLIC PANEL
ALT: 52 U/L (ref 0–53)
AST: 45 U/L — ABNORMAL HIGH (ref 0–37)
Albumin: 4 g/dL (ref 3.5–5.2)
Alkaline Phosphatase: 45 U/L (ref 39–117)
BUN: 12 mg/dL (ref 6–23)
CO2: 26 mEq/L (ref 19–32)
Calcium: 9.1 mg/dL (ref 8.4–10.5)
Chloride: 107 mEq/L (ref 96–112)
Creatinine, Ser: 1.16 mg/dL (ref 0.40–1.50)
GFR: 63.34 mL/min (ref >60.00)
Glucose, Bld: 108 mg/dL — ABNORMAL HIGH (ref 70–99)
Potassium: 3.6 mEq/L (ref 3.5–5.1)
Sodium: 142 mEq/L (ref 135–145)
Total Bilirubin: 0.4 mg/dL (ref 0.2–1.2)
Total Protein: 6.5 g/dL (ref 6.0–8.3)

## 2021-11-12 LAB — LIPID PANEL
Cholesterol: 135 mg/dL (ref 0–200)
HDL: 32 mg/dL — ABNORMAL LOW (ref >39.00)
LDL Cholesterol: 63 mg/dL (ref 0–99)
NonHDL: 102.61
Total CHOL/HDL Ratio: 4
Triglycerides: 200 mg/dL — ABNORMAL HIGH (ref 0.0–149.0)
VLDL: 40 mg/dL (ref 0.0–40.0)

## 2021-11-12 LAB — HEMOGLOBIN A1C: Hgb A1c MFr Bld: 7.2 % — ABNORMAL HIGH (ref 4.6–6.5)

## 2021-11-12 NOTE — Assessment & Plan Note (Signed)
Well controlled, no changes to meds. Encouraged heart healthy diet such as the DASH diet and exercise as tolerated.  °
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
## 2021-11-12 NOTE — Assessment & Plan Note (Signed)
Encourage heart healthy diet such as MIND or DASH diet, increase exercise, avoid trans fats, simple carbohydrates and processed foods, consider a krill or fish or flaxseed oil cap daily.  °

## 2021-11-12 NOTE — Assessment & Plan Note (Signed)
hgba1c to be checked, minimize simple carbs. Increase exercise as tolerated. Continue current meds  

## 2021-11-12 NOTE — Patient Instructions (Signed)

## 2021-11-21 DIAGNOSIS — E785 Hyperlipidemia, unspecified: Secondary | ICD-10-CM

## 2021-11-21 DIAGNOSIS — E1169 Type 2 diabetes mellitus with other specified complication: Secondary | ICD-10-CM | POA: Diagnosis not present

## 2021-11-21 DIAGNOSIS — E1165 Type 2 diabetes mellitus with hyperglycemia: Secondary | ICD-10-CM | POA: Diagnosis not present

## 2021-11-21 NOTE — Progress Notes (Signed)
Was unsuccessfully to reacha  customer service representative the hold wait was over and hour and left a brief message with call back info. The automated system stated Ozempic sample with be shipped out soon within 7 days.  Corrie Mckusick, [place]

## 2021-11-22 ENCOUNTER — Telehealth: Payer: Medicare Other

## 2021-11-22 ENCOUNTER — Other Ambulatory Visit: Payer: Self-pay | Admitting: Internal Medicine

## 2021-12-03 ENCOUNTER — Telehealth: Payer: Self-pay | Admitting: Family Medicine

## 2021-12-03 ENCOUNTER — Ambulatory Visit: Payer: Medicare Other | Admitting: Pharmacist

## 2021-12-03 DIAGNOSIS — Z7901 Long term (current) use of anticoagulants: Secondary | ICD-10-CM

## 2021-12-03 DIAGNOSIS — E1169 Type 2 diabetes mellitus with other specified complication: Secondary | ICD-10-CM

## 2021-12-03 DIAGNOSIS — E1121 Type 2 diabetes mellitus with diabetic nephropathy: Secondary | ICD-10-CM

## 2021-12-03 DIAGNOSIS — E785 Hyperlipidemia, unspecified: Secondary | ICD-10-CM

## 2021-12-03 DIAGNOSIS — I10 Essential (primary) hypertension: Secondary | ICD-10-CM

## 2021-12-03 NOTE — Telephone Encounter (Signed)
I am ok with holding 3 doses and if high risk bleed 5 doses pre op then restart IV hep asap post op

## 2021-12-03 NOTE — Patient Instructions (Signed)
Garrett [name] It was a pleasure speaking with you today.  I have attached a summary of our visit today and information about your health goals.   Our next appointment is by telephone on February 25, 2022 at 8:30am  Please call the care guide team at [phone] if you need to cancel or reschedule your appointment.    If you have any questions or concerns, please feel free to contact me either at the phone number below or with a MyChart message.   Keep up the good work!  Cherre Robins, PharmD Clinical Pharmacist [facility] [phone] (direct line)  [phone] (main office number)    CARE PLAN ENTRY (Updated 12/03/2021)    Hypertension BP Readings from Last 3 Encounters:  11/12/21 118/88  11/01/21 137/89  10/16/21 126/64   Pharmacist Clinical Goal(s): Over the next 90 days, patient will work with PharmD and providers to maintain BP goal <140/90 Current regimen:  Verapamil 240mg  daily at bedtime Metoprolol tartrate 50mg  twice a day Interventions: Discussed blood pressure goal Updated medication list to reflect increase in metoprolol after last visit with cardiology  Patient self care activities - Over the next 90 days, patient will: Check blood pressure 2 to 3 time per week, document, and provide at future appointments Ensure daily salt intake < 2300 mg/day  Hyperlipidemia Lab Results  Component Value Date/Time   LDLCALC 63 11/12/2021 11:14 AM   LDLCALC 129 (H) 08/17/2020 09:48 AM   LDLCALC 99 07/18/2015 07:44 AM   LDLDIRECT 104.0 02/14/2020 10:25 AM   Pharmacist Clinical Goal(s): Over the next 90 days, patient will work with PharmD and providers to maintain LDL goal < 70 Current regimen:  Atorvastatin 80mg  daily  Fenofibrate 160mg  daily Interventions: Discussed LDL goal Patient self care activities - Over the next 90 days, patient will: Maintain cholesterol medication regimen. Great job - your LDL is now at goal.    Diabetes Lab Results  Component Value Date/Time   HGBA1C 7.2 (H) 11/12/2021 11:14 AM   HGBA1C 7.5 (H) 08/17/2021 05:29 AM   Pharmacist Clinical Goal(s): Over the next 90 days, patient will work with PharmD and providers to achieve A1c goal <7% Current regimen:  Metformin 500mg  twice a day with food Ozampic 0.5mg  once per week Interventions: Discussed A1c goals Approved for [employer] patient assistance. Called to see about next Ozempic shipement - still in process. Patient states he has 4 weeks of Ozempic Reviewed home blood glucose readings and reviewed goals  Fasting blood glucose goal (before meals) = 80 to 130 Blood glucose goal after a meal = less than 180  Patient self care activities - Over the next 90 days, patient will: Check blood sugar once daily, document, and provide at future appointments Continue Ozempic 0.5mg  inject subcutaneously once a week  Contact provider with any episodes of hypoglycemia Complete 9937 application for [place] / Ozempic patient assistance program   History of Pulmonary embolism (blood clot)  Pharmacist Clinical Goal(s) Over the next 90 days, patient will work with PharmD and providers to prevent recurrence of blood clots and prevent stroke Current regimen:  Xarelto 20mg  daily with supper Interventions: Screened for patient assistance program (unfortunately patient did not qualify in 2022)  Patient self care activities - Over the next 90 days, patient will: Continue to take Xarelto daily  Asthma: Pharmacist Clinical Goal(s) Over the next 90 days, patient will work with PharmD and providers to improve breathing and prevent hospitalizations related to asthma  exacerbations Current regimen:  Symbicort 80/4.72mcg - inhaler 2 puffs into lungs once a day Albuterol inhaler or nebulization solution - use as needed for shortness of breath or wheezing Interventions: Discussed maintenance versus rescue therapy.  Discussed seasonal  as the triggers  Patient self care activities - Over the next 90 days, patient will: Continue to follow up with Dr Melvyn Novas  Continue above medication regimen.   Medication management Pharmacist Clinical Goal(s): Over the next 90 days, patient will work with PharmD and providers to maintain optimal medication adherence Current pharmacy: [facility] Interventions Comprehensive medication review performed. Performed review of medications from recent hospitalization and discharge summary.  Continue current medication management strategy Patient self care activities - Over the next 90 days, patient will: Focus on medication adherence by filling and taking medications appropriately  Take medications as prescribed Report any questions or concerns to PharmD and/or provider(s)

## 2021-12-03 NOTE — Chronic Care Management (AMB) (Signed)
Chronic Care Management Pharmacy Note  12/03/2021 Name:  Garrett [name] MRN:  [id number] DOB:  1951-12-02  Subjective: Garrett [name] is an 70 y.o. year old male who is a primary patient of Ann Held, DO.  The CCM team was consulted for assistance with disease management and care coordination needs.    Engaged with patient by telephone for follow up visit in response to provider referral for pharmacy case management and/or care coordination services.   Consent to Services:  The patient was given information about Chronic Care Management services, agreed to services, and gave verbal consent prior to initiation of services.  Please see initial visit note for detailed documentation.   Patient Care Team: Carollee Herter, Alferd Apa, DO as PCP - General (Family Medicine) Milus Banister, MD (Gastroenterology) [name], MD (Neurosurgery) Tanda Rockers, MD as Consulting Physician (Pulmonary Disease) Cameron Sprang, MD as Consulting Physician (Neurology) [name], MD as Consulting Physician (Neurosurgery) Marlaine Hind, MD as Consulting Physician (Physical Medicine and Rehabilitation) Cherre Robins, [place] (Pharmacist) Ralene Ok, MD as Consulting Physician (General Surgery) Gardiner Barefoot, DPM as Consulting Physician (Podiatry)  Recent office visits: 11/12/2021 - Fam Med (Dr Etter Sjogren) F/U chronic condition. A1c noted to be stable. No medication changes.  05/11/21 PCP (Dr. Carollee Herter)- Annual visit. Start farxiga 5 mg.  Recent consult visits: 09/28/2021 - Heart and [facility] (Dr Lamonte Sakai - [facility]) Seen for f/u CAD. No med changes. F/U 6 months. 09/25/2021 - Surgeon (Dr [name] - [facility]) Seen for hiatal hernia. Patient would like to avoid surgery if possible. Suggested esophageal dilation after appt with Dr [name] / GI.  09/21/2021 - Pumlonary Warner Mccreedy, NP) F/U cough variant asthma. Recommended chest eray in 1 to 2 weeks. No medication  changes.  09/12/2021 - Podiatry (Dr Prudence Davidson) diabetic foot care - debrided nails. No med changes.  07/05/2021 GI Berniece Pap, NP) Seen for dysphagia. Stopped pantoprazole due to patient preference.  06/27/21 - Audiology (Dr Rachell Cipro) Replace left hearing aid; 06/27/21 Neuro (Dr Delice Lesch) neuropathy and migraines. No med changes - Referral to PT for right foot drop. F/U 1 year. 06/15/21 - Phone Call - PA for atrovastatin 1m - take 2 tablets daily. quantity limit and per insurance. Changed to 857mdaily.   Hospital visits: Performed medication review today to reconcile discharge medications and medication list in Epic.   08/14/2021 to 08/20/2021 Hospital Admission to [facility]or severe sepsis due to left sided pneumonia.  Medications Started at Discharge: Augmentin 8753m take 1 tablet every 12 hours for 4 days.  guaiFENesin-dextromethorphan (ROBITUSSIN DM) syrup - Take 5 mLs by mouth every 4 (four) hours as needed for cough magic mouthwash -Take 5 mLs by mouth 3 (three) times daily as needed for up to 5 days for mouth pain.,  Medications Stopped at Discharge - None Medications changeds at Discharge - None  08/14/2021 - GI (Dr JacArdis Hughsolonoscopy and EGD. Noted recurrent hiatal hernia. Dilated GE gunction. Two polyps removed. Noted to have diverticula. 02/20/2021 - to 02/27/2021 Hospital admission for hital hernia. No medication changes noted at discharge  Objective:  Lab Results  Component Value Date   CREATININE 1.16 11/12/2021   CREATININE 1.41 08/30/2021   CREATININE 1.11 08/19/2021    Lab Results  Component Value Date   HGBA1C 7.2 (H) 11/12/2021   Last diabetic Eye exam:  Lab Results  Component Value Date/Time   HMDIABEYEEXA No Retinopathy 12/21/2018 12:34 PM  Last diabetic Foot exam: No results found for: HMDIABFOOTEX      Component Value Date/Time   CHOL 135 11/12/2021 1114   CHOL 162 07/18/2015 0744   TRIG 200.0 (H) 11/12/2021 1114   TRIG 110  07/18/2015 0744   HDL 32.00 (L) 11/12/2021 1114   HDL 41 07/18/2015 0744   CHOLHDL 4 11/12/2021 1114   VLDL 40.0 11/12/2021 1114   LDLCALC 63 11/12/2021 1114   LDLCALC 129 (H) 08/17/2020 0948   LDLCALC 99 07/18/2015 0744   LDLDIRECT 104.0 02/14/2020 1025    Hepatic Function Latest Ref Rng & Units 11/12/2021 08/30/2021 08/15/2021  Total Protein 6.0 - 8.3 g/dL 6.5 7.2 5.5(L)  Albumin 3.5 - 5.2 g/dL 4.0 4.1 2.9(L)  AST 0 - 37 U/L 45(H) 29 28  ALT 0 - 53 U/L 52 43 39  Alk Phosphatase 39 - 117 U/L 45 60 39  Total Bilirubin 0.2 - 1.2 mg/dL 0.4 0.6 0.8  Bilirubin, Direct 0.0 - 0.3 mg/dL - - -    Lab Results  Component Value Date/Time   TSH 1.30 04/28/2018 10:16 AM   TSH 1.24 02/03/2017 10:50 AM    CBC Latest Ref Rng & Units 11/12/2021 08/30/2021 08/17/2021  WBC 4.0 - 10.5 K/uL 7.4 7.0 10.0  Hemoglobin 13.0 - 17.0 g/dL 12.0(L) 12.8(L) 10.4(L)  Hematocrit 39.0 - 52.0 % 36.6(L) 39.8 33.3(L)  Platelets 150.0 - 400.0 K/uL 261.0 362.0 229    No results found for: VD25OH  Clinical ASCVD: Yes  The 10-year ASCVD risk score (Arnett DK, et al., 2019) is: 34.4%   Values used to calculate the score:     Age: 45 years     Sex: Male     Is Non-Hispanic African American: No     Diabetic: Yes     Tobacco smoker: No     Systolic Blood Pressure: 481 mmHg     Is BP treated: Yes     HDL Cholesterol: 32 mg/dL     Total Cholesterol: 135 mg/dL      Social History   Tobacco Use  Smoking Status Former   Packs/day: 3.00   Years: 30.00   Pack years: 90.00   Types: Cigarettes   Quit date: 01/09/1991   Years since quitting: 30.9  Smokeless Tobacco Former   Types: Snuff   BP Readings from Last 3 Encounters:  11/12/21 118/88  11/01/21 137/89  10/16/21 126/64   Pulse Readings from Last 3 Encounters:  11/12/21 73  11/01/21 73  10/16/21 75   Wt Readings from Last 3 Encounters:  11/12/21 217 lb (98.4 kg)  11/01/21 206 lb (93.4 kg)  10/16/21 213 lb (96.6 kg)    Assessment: Review of  patient past medical history, allergies, medications, health status, including review of consultants reports, laboratory and other test data, was performed as part of comprehensive evaluation and provision of chronic care management services.   SDOH:  (Social Determinants of Health) assessments and interventions performed:      CCM Care Plan  Allergies  Allergen Reactions   Bee Venom Anaphylaxis   Garlic Swelling    Medications Reviewed Today     Reviewed by Cherre Robins, RPH-CPP (Pharmacist) on 12/03/21 at [phone]  Med List Status: <None>   Medication Order Taking? Sig Documenting Provider Last Dose Status Informant  albuterol (PROVENTIL) (2.5 MG/3ML) 0.083% nebulizer solution [id number] Yes Take 3 mLs (2.5 mg total) by nebulization every 6 (six) hours as needed for wheezing or shortness of breath. Carollee Herter, Alferd Apa,  DO Taking Active Self  allopurinol (ZYLOPRIM) 100 MG tablet [id number] Yes TAKE 1 TABLET BY MOUTH  DAILY Carollee Herter, Alferd Apa, DO Taking Active Self  atorvastatin (LIPITOR) 80 MG tablet [id number] Yes Take 80 mg by mouth every evening. [provider] Taking Active Self  budesonide-formoterol (SYMBICORT) 80-4.5 MCG/ACT inhaler [id number] Yes Inhale 2 puffs into the lungs daily. [provider] Taking Active Self  celecoxib (CELEBREX) 200 MG capsule [id number] Yes TAKE 1 CAPSULE BY MOUTH  TWICE DAILY Ann Held, DO Taking Active Self  EPINEPHrine 0.3 mg/0.3 mL IJ SOAJ injection [id number] Yes Inject 0.3 mg into the muscle as needed for anaphylaxis.  [provider] Taking Active Self  famotidine (PEPCID) 20 MG tablet [id number] Yes Take 1 tablet (20 mg total) by mouth at bedtime. Milus Banister, MD Taking Active Self  fenofibrate 160 MG tablet [id number] Yes TAKE 1 TABLET BY MOUTH  DAILY  Patient taking differently: Take 160 mg by mouth at bedtime.   [name] R, DO Taking Active   Ferrous Sulfate (IRON PO) [id number] Yes Take 1  tablet by mouth daily. [provider] Taking Active Self  fluticasone (FLONASE) 50 MCG/ACT nasal spray [id number] Yes Place 2 sprays into both nostrils daily. Ann Held, DO Taking Active Self  furosemide (LASIX) 40 MG tablet [id number] Yes Take 40 mg by mouth See admin instructions. 37m in the morning  20 mg in evening [provider] Taking Active Self  gabapentin (NEURONTIN) 100 MG capsule [id number] TAKE 2 CAPSULES BY MOUTH AT BEDTIME SLyndal Pulley DO Taking Active Self  levocetirizine (XYZAL) 5 MG tablet [id number] Take 1 tablet (5 mg total) by mouth every evening. LAnn Held DO Taking Active Self  magnesium oxide (MAG-OX) 400 (241.3 Mg) MG tablet [id number] Take 1 tablet (400 mg total) by mouth daily. AReyne Dumas MD Taking Active Self  meclizine (ANTIVERT) 25 MG tablet [id number] Take 1 tablet (25 mg total) by mouth 3 (three) times daily as needed for dizziness. LRoma SchanzR, DO Taking Active Self  memantine (NAMENDA) 10 MG tablet [id number] TAKE 2 TABLETS BY MOUTH  DAILY LAnn Held DO Taking Active Self  metFORMIN (GLUCOPHAGE) 500 MG tablet [id number] TAKE 1 TABLET BY MOUTH  TWICE DAILY WITH A MEAL LAnn Held DO Taking Active Self  metoCLOPramide (REGLAN) 5 MG tablet [id number] Take 1 tablet (5 mg total) by mouth 4 (four) times daily -  before meals and at bedtime. JMilus Banister MD Taking Active   metoprolol tartrate (LOPRESSOR) 50 MG tablet [id number] Take 50 mg by mouth 2 (two) times daily. [provider] Taking Active Self  Multiple Vitamins-Minerals (ONE-A-DAY WEIGHT SMART ADVANCE PO) [id number] Take 1 tablet by mouth daily. Centrum Silver [provider] Taking Active Self  nitroGLYCERIN (NITROSTAT) 0.4 MG SL tablet [id number] Place 0.4 mg under the tongue every 5 (five) minutes as needed for chest pain. [provider] Taking Active Self  omeprazole  (PRILOSEC) 40 MG capsule [id number] Take 40 mg by mouth 2 (two) times daily. [provider] Taking Active Self  ODonald Sivatest strip [id number] USE AS DIRECTED THREE TIMES DAILY. LAnn Held DO Taking Active Self  potassium chloride SA (KLOR-CON) 20 MEQ tablet [id number] TAKE 2 TABLETS BY MOUTH  DAILY  Patient taking differently: Take 20 mEq by mouth 2 (two) times daily.  Carollee Herter, Alferd Apa, DO Taking Active   PROAIR HFA 108 [id number] Base) MCG/ACT inhaler [id number] Yes Inhale 1 puff into the lungs every 6 (six) hours as needed for wheezing or shortness of breath. Ann Held, DO Taking Active Self  Semaglutide,0.25 or 0.5MG/DOS, (OZEMPIC, 0.25 OR 0.5 MG/DOSE,) 2 MG/1.5ML SOPN [id number] Yes Inject 0.5 mg into the skin every Sunday. [provider] Taking Active Self  SUMAtriptan (IMITREX) 50 MG tablet [id number] Yes Take 1 tablet as needed for migraine/vertigo. Do not take more than 3 a week  Patient taking differently: Take 50 mg by mouth every 2 (two) hours as needed for migraine. Max 3 tabs per week   Carollee Herter, Alferd Apa, DO Taking Active   tadalafil (CIALIS) 20 MG tablet [id number] Yes Take 0.5-1 tablets (10-20 mg total) by mouth every other day as needed for erectile dysfunction. Ann Held, DO Taking Active Self  topiramate (TOPAMAX) 50 MG tablet [id number] Yes Take 1 tablet (50 mg total) by mouth 2 (two) times daily. Cameron Sprang, MD Taking Active Self  verapamil (CALAN-SR) 240 MG CR tablet [id number] Yes TAKE 1 TABLET BY MOUTH AT  BEDTIME Ann Held, DO Taking Active Self  vitamin B-12 (CYANOCOBALAMIN) 1000 MCG tablet [id number] Yes Take 1,000 mcg by mouth daily. [provider] Taking Active Self  XARELTO 20 MG TABS tablet [id number] Yes TAKE 1 TABLET BY MOUTH  DAILY WITH SUPPER Tanda Rockers, MD Taking Active             Patient Active Problem List   Diagnosis Date Noted   Severe sepsis ([place])  08/14/2021   Pneumonia 08/14/2021   Lactic acidosis 08/14/2021   Hypokalemia 08/14/2021   DOE (dyspnea on exertion) 05/11/2021   Abnormal stress test 04/20/2021   Acute respiratory distress    Acute respiratory failure with hypoxia (HCC)    S/P Nissen fundoplication (without gastrostomy tube) procedure 02/20/2021   Body mass index (BMI) 33.0-33.9, adult 11/03/2019   Laceration of right hand 08/02/2019   Acute pulmonary embolism ([place]) 01/07/2019   Chronic diastolic CHF (congestive heart failure) ([place]) 01/07/2019   Preventative health care 02/10/2018   Dizziness 02/10/2018   Spondylolisthesis at L3-L4 level 10/28/2017   Cough variant asthma  vs uacs/ pseudoasthma 06/17/2017   Pneumonia of right middle lobe due to infectious organism 02/28/2017   Sepsis ([place]) 02/28/2017   Pulmonary embolism and infarction ([place]) 02/09/2017   Bilateral pulmonary embolism ([place]) 02/04/2017   Greater trochanteric bursitis of left hip 01/14/2017   Greater trochanteric bursitis of right hip 12/20/2016   Degenerative arthritis of knee, bilateral 10/08/2016   Upper airway cough syndrome 03/22/2016   Multiple pulmonary nodules 03/22/2016   Acute bronchitis 01/22/2016   Headache disorder 01/04/2016   Acute upper respiratory infection 10/25/2015   Elevated CK 07/18/2015   History of colonic polyps 04/11/2015   Lumbar stenosis with neurogenic claudication 11/14/2014   Spondylolysis of cervical region 02/25/2014   Lumbosacral spondylosis without myelopathy 01/06/2014   OSA (obstructive sleep apnea) 12/08/2013   SOB (shortness of breath) on exertion 11/04/2013   Morbid obesity due to excess calories ([place])    Cervicalgia    Venous insufficiency of leg 02/18/2012   Itching 02/18/2012   Mild dementia 05/28/2011   Hyperlipidemia associated with type 2 diabetes mellitus ([place]) 05/27/2011   Controlled type 2 diabetes mellitus with diabetic nephropathy ([place]) 04/10/2011   Gout 04/10/2011   Essential hypertension  04/10/2011   OA (osteoarthritis) 04/10/2011  GERD (gastroesophageal reflux disease) 04/10/2011   Migraine headache without aura 04/10/2011   Allergic rhinitis, cause unspecified 04/10/2011   Bee sting allergy 04/10/2011    Immunization History  Administered Date(s) Administered   Fluad Quad(high Dose 65+) 10/07/2019, 10/19/2020, 08/30/2021   Influenza Split 09/30/2012   Influenza, High Dose Seasonal PF 09/19/2016, 10/07/2017, 10/16/2018   Influenza,inj,Quad PF,6+ Mos 09/15/2013, 09/26/2014, 09/22/2015   PFIZER(Purple Top)SARS-COV-2 Vaccination 02/12/2020, 03/07/2020, 08/19/2020   Pneumococcal Conjugate-13 07/18/2015   Pneumococcal Polysaccharide-23 04/10/2011, 09/19/2016   Tdap 04/10/2011, 04/09/2019, 07/22/2019   Zoster, Live 03/28/2014    Conditions to be addressed/monitored: CHF, CAD, HTN, HLD, Hypertriglyceridemia, DMII, and Asthma, neuropathy, migraine headaches, GERD, gout, mild anemia, OSA; recurrent PE with chronic anticoagulation  Care Plan : General Pharmacy (Adult)  Updates made by Cherre Robins, RPH-CPP since 12/03/2021 12:00 AM     Problem: asthma, hyperlipidemia; type 2 DM; CAD; CHF; HTN; recurrent PE; chronic anticoagulation; migraines; neuropathy; gout; GERD; mild dementia; vertigo; OSA; seasonal allergies   Priority: High  Onset Date: 08/07/2021  Note:   Current Barriers:  Unable to maintain control of type 2 DM Does not adhere to prescribed medication regimen Education needed regarding medication regimen and chronic conditions  Pharmacist Clinical Goal(s):  Over the next 90 days, patient will achieve adherence to monitoring guidelines and medication adherence to achieve therapeutic efficacy achieve control of type 2 DM as evidenced by A1c < 7.0 maintain control of HTN, CHF, hyperlipidemia and asthma  as evidenced by attainment of goals listed below  through collaboration with PharmD and provider.   Interventions: 1:1 collaboration with Carollee Herter, Alferd Apa, DO regarding development and update of comprehensive plan of care as evidenced by provider attestation and co-signature Inter-disciplinary care team collaboration (see longitudinal plan of care) Comprehensive medication review performed; medication list updated in electronic medical record    Hypertension / CHF:  Controlled; Goal: BP <140/90, minimize symptoms of CHF and prevent CHF exacerbations Current regimen:  Verapamil 213m daily at bedtime Metoprolol tartrate 535mtwice a day Furosemide 4027mach morning and 53m18mch evening Potassium chloride 53mE42mtake 2 tablets daily Type: Diastolic Last ejection fraction: 60-65% (08/16/2021) - previously on 03/03/17 EF: 65-70% NYHA Class: II (slight limitation of activity) AHA HF Stage: B (Heart disease present - no symptoms present) Patient has failed these meds in past: None noted  Last BNP was 123 (02/26/2021) Home blood pressure readings:  118 to 132 / 70s Interventions: (addressed at previous visit)  Discussed blood pressure goal Check blood pressure 2 to 3 time per week, document, and provide at future appointments Ensure daily salt intake < 2300 mg/day  Hyperlipidemia / CAD;  LDL at goal; LDL goal < 70 but triglycerides still > goal of 150 Current regimen:  Atorvastatin 80mg 58my (dose increased 05/11/2021) Fenofibrate 160mg d53m Interventions: Discussed LDL goal Maintain cholesterol medication regimen - expect triglycerides will improved with improved BG  Diabetes A1c improved but not at goal yet;  A1c goal <7.0 Checks BG: Daily Recent FBG Readings: 130-140's (previously 150-160's) Denie s/sx of hypoglycemia.  Current regimen:  Metformin 500mg tw54ma day with food Ozempic 0.5mg inje17md once per week (Increased from 0.25mg 2 we58mago) Previous medications tried: metformin 1000mg bid -61mside effects; Victoza daily - stopped after weight loss and improvement in A1c.  Applied for Novo [employer] assistance  and was approved.  However patient has not received first shipment yet Interventions: Discussed A1c goals Continue Ozempic 0.5mg SQ week21m- he  has been approved for [employer] assistance however his shipment has been held up for over a month due to supply issues with [employer] patient assistance program. Verified with Novo patient assistance program that his shipment is in process. Patient reports he has 1 full pen which will last for 4 doses. Also reminded patient to complete [employer] patient assistance program application for 7824 and return to office.  Reviewed home blood glucose readings and reviewed goals  Fasting blood glucose goal (before meals) = 80 to 130 Blood glucose goal after a meal = less than 180  Patient self care activities - Over the next 90 days, patient will: Check blood sugar once daily, document, and provide at future appointments Continue Ozempic 0.63m inject subcutaneously once a week  History of Pulmonary embolism (blood clot)  Controlled; Goal: prevent recurrence of blood clots and prevent stroke Current regimen:  Xarelto 274mdaily with supper Interventions: (addressed at previous visit)  Screened for patient assistance program (unfortunately patient did not qualify for 2022)  Continue to take Xarelto daily   Asthma: Controlled; Goal: improve breathing and prevent hospitalizations related to asthma exacerbations Current regimen:  Symbicort 80/4.6m80m- inhaler 2 puffs into lungs once a day (during allergy season) Albuterol inhaler or nebulization solution - use as needed for shortness of breath or wheezing Patient has failed these meds in past: Trelegy  Using maintenance inhaler regularly? Yes during allergy season (Spring and Fall)  Frequency of rescue inhaler use: rarely uses currently but during allergy season 1 or 2 time per week Managed by pulmonology - Dr. WerMelvyn Novasterventions: Discussed maintenance versus rescue therapy.  Discussed seasonal  triggers  Continue to follow up with Dr WerMelvyn Novasontinue above medication regimen.   Medication management Pharmacist Clinical Goal(s): Over the next 90 days, patient will work with PharmD and providers to maintain optimal medication adherence Current pharmacy: [facility] Interventions Comprehensive medication review performed. Continue current medication management strategy Focus on medication adherence by filling and taking medications appropriately  Take medications as prescribed Report any questions or concerns to PharmD and/or provider(s)  Patient Goals/Self-Care Activities Over the next 90 days, patient will:  take medications as prescribed,  check glucose daily, document, and provide at future appointments,  collaborate with provider on medication access solutions.  Follow Up Plan: Telephone follow up appointment with care management team member scheduled for:  clinical pharmacist will follow up in 3 months; Will have CMA check in on patient assistance in Jan 2023.        Medication Assistance: Patient is in Medicare coverage gap and cost of Symbicort, FarW[place] Gladed Xarelto has increased.Patient states assistance would be helpful but he can afford to continue these medications even if he dose not qualify. During 08/08/2021 phone visit performed review of patient assistance program qualifications and unfortunately patient does not currently qualify for patient assistance of the above mention  Patient has been approved for [employer] PAP for Ozempic through 11/21/2021. A refill request was made in November but has not shipped yet. Per NovoNordisk patient assistance program is it in process (delayed due to supply issues) patient endorsed today that he has at least 4 doses of Ozmepic on hand.     Patient's preferred pharmacy is:  [facility] - [id number]: [phone]:  [phone] ([address] [phone] [place] [address] Phone: [phone] Fax: [phone]  [facility] ([facility] ) - [place], [address] [place] [address] Phone: [phone] Fax: [phone]  Uses pill box? Yes Pt endorses 99% compliance  Follow Up:  Patient agrees to Care Plan and Follow-up.  Plan: Telephone follow up appointment with care management team member scheduled for:  3 months (CMA will check back in January 2023 regarding patient assistance program .   Cherre Robins, PharmD Clinical Pharmacist [facility]

## 2021-12-03 NOTE — Telephone Encounter (Signed)
[facility]: [phone] Attempting to schedule surgery for 12/19. They need surgical clearance regarding how long can pt be off blood thinners. Please advise.

## 2021-12-05 ENCOUNTER — Telehealth: Payer: Self-pay | Admitting: Internal Medicine

## 2021-12-05 NOTE — Telephone Encounter (Signed)
I called and spoke with [place] and notified of response per Dr Melvyn Novas  She verbalized understanding  Nothing further needed

## 2021-12-05 NOTE — Telephone Encounter (Signed)
Spoke with [facility] and together we figured out that Dr. Melvyn Novas [facility]) has been rxing the blood thinners. ENT Judson Roch, RN) will get in contact with his office.

## 2021-12-05 NOTE — Telephone Encounter (Signed)
Hold a total of 3 doses before surgery and leave it up to surgeon to say when to restart it.

## 2021-12-05 NOTE — Telephone Encounter (Signed)
Spoke with [place]  She states pt is having cochlear implant done on Monday 12/10/21 She is asking if pt needs to hold xarelto before or after his procedure  Please advise, thanks!

## 2021-12-05 NOTE — Telephone Encounter (Signed)
[facility] called back and stated they need clearance from Dr. Etter Sjogren since she prescribed his blood thinners.

## 2021-12-10 DIAGNOSIS — Z45321 Encounter for adjustment and management of cochlear device: Secondary | ICD-10-CM | POA: Diagnosis not present

## 2021-12-10 DIAGNOSIS — E1121 Type 2 diabetes mellitus with diabetic nephropathy: Secondary | ICD-10-CM | POA: Diagnosis not present

## 2021-12-10 DIAGNOSIS — R079 Chest pain, unspecified: Secondary | ICD-10-CM | POA: Diagnosis not present

## 2021-12-10 DIAGNOSIS — I517 Cardiomegaly: Secondary | ICD-10-CM | POA: Diagnosis not present

## 2021-12-10 DIAGNOSIS — H903 Sensorineural hearing loss, bilateral: Secondary | ICD-10-CM | POA: Diagnosis not present

## 2021-12-10 DIAGNOSIS — Z794 Long term (current) use of insulin: Secondary | ICD-10-CM | POA: Diagnosis not present

## 2021-12-10 HISTORY — PX: COCHLEAR IMPLANT: SHX184

## 2021-12-11 DIAGNOSIS — R079 Chest pain, unspecified: Secondary | ICD-10-CM | POA: Diagnosis not present

## 2021-12-11 DIAGNOSIS — E1121 Type 2 diabetes mellitus with diabetic nephropathy: Secondary | ICD-10-CM | POA: Diagnosis not present

## 2021-12-11 DIAGNOSIS — H903 Sensorineural hearing loss, bilateral: Secondary | ICD-10-CM | POA: Diagnosis not present

## 2021-12-11 DIAGNOSIS — I517 Cardiomegaly: Secondary | ICD-10-CM | POA: Diagnosis not present

## 2021-12-11 DIAGNOSIS — Z794 Long term (current) use of insulin: Secondary | ICD-10-CM | POA: Diagnosis not present

## 2021-12-12 ENCOUNTER — Other Ambulatory Visit: Payer: Self-pay

## 2021-12-12 ENCOUNTER — Encounter: Payer: Self-pay | Admitting: Podiatry

## 2021-12-12 ENCOUNTER — Ambulatory Visit: Payer: Medicare Other | Admitting: Podiatry

## 2021-12-12 DIAGNOSIS — M79674 Pain in right toe(s): Secondary | ICD-10-CM

## 2021-12-12 DIAGNOSIS — B351 Tinea unguium: Secondary | ICD-10-CM

## 2021-12-12 DIAGNOSIS — E1121 Type 2 diabetes mellitus with diabetic nephropathy: Secondary | ICD-10-CM

## 2021-12-12 DIAGNOSIS — M79675 Pain in left toe(s): Secondary | ICD-10-CM | POA: Diagnosis not present

## 2021-12-12 DIAGNOSIS — I872 Venous insufficiency (chronic) (peripheral): Secondary | ICD-10-CM

## 2021-12-12 NOTE — Progress Notes (Signed)
This patient returns to my office for at risk foot care.  This patient requires this care by a professional since this patient will be at risk due to having diabetes and coagulation defect. This patient is unable to cut nails himself since the patient cannot reach his nails.These nails are painful walking and wearing shoes.  This patient presents for at risk foot care today.  General Appearance  Alert, conversant and in no acute stress.  Vascular  Dorsalis pedis and posterior tibial  pulses are palpable  bilaterally.  Capillary return is within normal limits  bilaterally. Temperature is within normal limits  bilaterally.  Neurologic  Senn-Weinstein monofilament wire test diminished/absent bilaterally. Muscle power within normal limits bilaterally.  Nails Thick disfigured discolored nails with subungual debris  from hallux to fifth toes bilaterally. No evidence of bacterial infection or drainage bilaterally.  Orthopedic  No limitations of motion  feet .  No crepitus or effusions noted.  No bony pathology or digital deformities noted.  Midfoot arthritis  B/L.  Skin  normotropic skin with no porokeratosis noted bilaterally.  No signs of infections or ulcers noted.     Onychomycosis  Pain in right toes  Pain in left toes  Consent was obtained for treatment procedures.   Mechanical debridement of nails 1-5  bilaterally performed with a nail nipper.  Filed with dremel without incident.    Return office visit    3 months                 Told patient to return for periodic foot care and evaluation due to potential at risk complications.   Gardiner Barefoot DPM

## 2021-12-13 ENCOUNTER — Telehealth: Payer: Self-pay | Admitting: Family Medicine

## 2021-12-13 NOTE — Telephone Encounter (Signed)
Pt called and stated he needs another copy of paperwork that was given to him. He would like it emailed to the email that's on his file:   [email or web address]

## 2021-12-14 DIAGNOSIS — H5202 Hypermetropia, left eye: Secondary | ICD-10-CM | POA: Diagnosis not present

## 2021-12-14 DIAGNOSIS — E119 Type 2 diabetes mellitus without complications: Secondary | ICD-10-CM | POA: Diagnosis not present

## 2021-12-14 DIAGNOSIS — H524 Presbyopia: Secondary | ICD-10-CM | POA: Diagnosis not present

## 2021-12-14 DIAGNOSIS — Z7984 Long term (current) use of oral hypoglycemic drugs: Secondary | ICD-10-CM | POA: Diagnosis not present

## 2021-12-14 DIAGNOSIS — D3131 Benign neoplasm of right choroid: Secondary | ICD-10-CM | POA: Diagnosis not present

## 2021-12-14 DIAGNOSIS — H52223 Regular astigmatism, bilateral: Secondary | ICD-10-CM | POA: Diagnosis not present

## 2021-12-14 DIAGNOSIS — Z961 Presence of intraocular lens: Secondary | ICD-10-CM | POA: Diagnosis not present

## 2021-12-14 NOTE — Telephone Encounter (Signed)
Application has been sent per patient's request. Patient notified by [facility] message.

## 2021-12-18 ENCOUNTER — Encounter: Payer: Self-pay | Admitting: Internal Medicine

## 2021-12-25 ENCOUNTER — Other Ambulatory Visit: Payer: Self-pay | Admitting: Family Medicine

## 2021-12-25 DIAGNOSIS — R059 Cough, unspecified: Secondary | ICD-10-CM

## 2021-12-26 ENCOUNTER — Other Ambulatory Visit: Payer: Self-pay

## 2021-12-26 MED ORDER — BUDESONIDE-FORMOTEROL FUMARATE 80-4.5 MCG/ACT IN AERO: 2.0000 | INHALATION_SPRAY | Freq: Every day | RESPIRATORY_TRACT | 1 refills | Status: DC

## 2021-12-27 DIAGNOSIS — Z48810 Encounter for surgical aftercare following surgery on the sense organs: Secondary | ICD-10-CM | POA: Diagnosis not present

## 2021-12-27 DIAGNOSIS — Z9621 Cochlear implant status: Secondary | ICD-10-CM | POA: Diagnosis not present

## 2021-12-28 ENCOUNTER — Encounter: Payer: Self-pay | Admitting: Gastroenterology

## 2021-12-28 ENCOUNTER — Ambulatory Visit: Payer: Medicare Other | Admitting: Gastroenterology

## 2021-12-28 VITALS — BP 110/60 | HR 80 | Ht 69.0 in | Wt 211.2 lb

## 2021-12-28 DIAGNOSIS — R1310 Dysphagia, unspecified: Secondary | ICD-10-CM

## 2021-12-28 DIAGNOSIS — K3184 Gastroparesis: Secondary | ICD-10-CM
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
## 2021-12-28 NOTE — Patient Instructions (Signed)
If you are age 72 or older, your body mass index should be between 23-30. Your Body mass index is 31.2 kg/m. If this is out of the aforementioned range listed, please consider follow up with your Primary Care Provider. ________________________________________________________  The [facility] providers would like to encourage you to use [facility] to communicate with providers for non-urgent requests or questions.  Due to long hold times on the telephone, sending your provider a message by [facility] may be a faster and more efficient way to get a response.  Please allow 48 business hours for a response.  Please remember that this is for non-urgent requests.  _______________________________________________________  Dennis Bast will follow up in our office on an as needed basis.  Thank you for entrusting me with your care and choosing [facility].  Dr [name]

## 2022-01-09 ENCOUNTER — Other Ambulatory Visit: Payer: Self-pay | Admitting: Family Medicine

## 2022-01-21 ENCOUNTER — Ambulatory Visit: Payer: Medicare Other | Admitting: Internal Medicine

## 2022-01-25 DIAGNOSIS — Z45321 Encounter for adjustment and management of cochlear device: Secondary | ICD-10-CM | POA: Diagnosis not present

## 2022-01-28 ENCOUNTER — Other Ambulatory Visit: Payer: Self-pay | Admitting: Internal Medicine

## 2022-01-29 ENCOUNTER — Other Ambulatory Visit: Payer: Self-pay

## 2022-01-29 ENCOUNTER — Encounter: Payer: Self-pay | Admitting: Internal Medicine

## 2022-01-29 ENCOUNTER — Ambulatory Visit: Payer: Medicare Other | Admitting: Internal Medicine

## 2022-01-29 ENCOUNTER — Telehealth: Payer: Self-pay | Admitting: *Deleted

## 2022-01-29 DIAGNOSIS — I2699 Other pulmonary embolism without acute cor pulmonale: Secondary | ICD-10-CM

## 2022-01-29 DIAGNOSIS — J45991 Cough variant asthma: Secondary | ICD-10-CM

## 2022-01-29 DIAGNOSIS — R0602 Shortness of breath: Secondary | ICD-10-CM

## 2022-01-29 MED ORDER — RIVAROXABAN 20 MG PO TABS: 20.0000 mg | ORAL_TABLET | Freq: Every day | ORAL | 3 refills | Status: DC

## 2022-01-29 NOTE — Telephone Encounter (Signed)
Left message on machine that Ozempic is ready for pickup.

## 2022-01-29 NOTE — Assessment & Plan Note (Signed)
06/17/2017  After extensive coaching HFA effectiveness =    90% > change back to symbicort 80 2bid   All goals of chronic asthma control met including optimal function and elimination of symptoms with minimal need for rescue therapy.  Contingencies discussed in full including contacting this office immediately if not controlling the symptoms using the rule of two's.     Each maintenance medication was reviewed in detail including emphasizing most importantly the difference between maintenance and prns and under what circumstances the prns are to be triggered using an action plan format where appropriate.  Total time for H and P, chart review, counseling, reviewing hfa device(s) , directly observing portions of ambulatory 02 saturation study/ and generating customized AVS unique to this office visit / same day charting = 31 min

## 2022-01-29 NOTE — Assessment & Plan Note (Signed)
CTa 02/04/2017  Bilateral pulmonary artery emboli. No findings for right heart Strain. - Venous dopplers   02/19/17 > neg  Bilaterally   - CTa off DOAC  01/07/2019 Acute pulmonary embolus involving bilateral pulmonary arteries as described. There is no pulmonary infarct.  -Echo 01/08/2019  Nl - venous dopplers 01/08/2019 > neg bilaterally  >>>lifelong DOAC indicated unless can't tolerate/ discussed

## 2022-01-29 NOTE — Patient Instructions (Signed)
We will walk you today to check out your 02 levels with activity   No change in medications   Please schedule a follow up visit in 12  months but call sooner if needed

## 2022-01-29 NOTE — Progress Notes (Signed)
Subjective:    Patient ID: Garrett [name], male    DOB: 12/07/1951,   MRN: [id number]   Brief patient profile:  20 yowm quit smoking 1992 and exposed to dust in [place] until age of 73  no respiratory problems except for OSA until fall 2016 with recurrent coughing/ sob/ wheezing since then already rx with Symbicort/ saba so referred to pulmonary clinic 03/21/2016 by Dr Etter Sjogren with abn ct chest but no evidence of significant airflow obstruction on spirometry in 01/2017    History of Present Illness  03/21/2016 1st  Pulmonary office visit/ Garrett [name]  On maint symbicort 160  Chief Complaint  Patient presents with   Pulmonary Consult    Referred by Dr. Etter Sjogren. Pt states he had PNA recently and has had cough and SOB for the past few months- esp worse over the past 6 wks. He gets SOB walking short distances such as to his mailbox. His cough is prod at times with yellow/green sputum.    prior to 6 m prior to OV  Back and forth mb fine/ no need for any inhalers but breathing never right since, best rx is prednisone no def resp to symbicort or saba  rec Stop Altace and start avapro  150 mg  One half daily - take a whole if blood pressure too high Pantoprazole Take 30- 60 min before your first and last meals of the day  GERD diet  If get worse > Prednisone 10 mg take  4 each am x 2 days,   2 each am x 2 days,  1 each am x 2 days and stop     05/02/2016  f/u ov/Inis Garrett [name] re:  prob acei uacs / mpns  Chief Complaint  Patient presents with   Follow-up    Breathing is much improved- almost back to his normal baseline. He is no longer coughing. Has not had to use albuterol.    improved p 2 weeks off acei and now Not limited by breathing from desired activities   rec We will call you for follow up ct in Sept 2017> no change in old nodules, one new only 6 mm > rec 12 m f/u     02/03/2017  Acute extended  ov/Anav Lammert re: new sob  Chief Complaint  Patient presents with   Follow-up    upper airway cough  syndrome, not having much cough but still struggles with dyspnea with exertion  acute ill on a 01/17/17  with fever chills and sob and minimal cough dry > UC same Sunday 01/19/17 Garrett [name] /Garrett [name] 500 x 10 days and pred rec  Then doxy still has one day left but breathing but sob x 50 fts  Baseline = slower than nl = MMRC2 = can't walk a nl pace on a flat grade s sob but does fine slow and flat eg walking at mall  Not on any kind of regular use inhaler - symbicort listed but not taking Now on neb and it too does not really help his sob Taking ppi but not ac       No noct symptoms at all  rec Protonix Take 30- 60 min before your first and last meals of the day  GERD diet  CTa 02/04/17 > pos PE > xarelto     02/10/2017  Post hosp f/u ov/transition of care / Garrett [name] re: doe / dx of PE/ not using symb very rare need for saba/ never neb  Chief Complaint  Patient presents with  Follow-up    F/u for CT results. Breathing has been getting better since last OV.   Not limited by breathing from desired activities  But Relatively sedentary. rec Only use your albuterol as a rescue medication   - Ok to use up to 2 puffs  every 4 hours  But if you start needing it for any reason more than you do now then you need to restart regular use of symbicort 80 Take 2 puffs first thing in am and then another 2 puffs about 12 hours later.         10/19/2020  f/u ov/Garrett [name] re: ? Cough variant asthma/ ? chf with BNP 1200 on recent er eval at [facility] > cards w/u there in progress but 43 on repeat 09/29/20  Chief Complaint  Patient presents with   Follow-up    SOB improved since last ED visit   Dyspnea: flat surfaces slow  Cough: resolved  Sleeping:on CPAP from [place] /30 degrees electric bed  SABA use: on symbicort  And not using saba now/ worse on trelegy so changed back to symb 80  02: none Rec Plan A = Automatic = Always=    symbicort  80 Take 2 puffs first thing in am and then another 2 puffs about 12 hours  later.  Plan B = Backup (to supplement plan A, not to replace it) Only use your albuterol inhaler as a rescue medication  Plan C = Crisis (instead of Plan B but only if Plan B stops working) - only use your albuterol nebulizer if you first try Plan B and it fails to help   01/29/2022  f/u ov/Garrett [name] re: uacs vs asthma    maint on symbicort   Chief Complaint  Patient presents with   Follow-up    Breathing is about the same. He needs to continue getting refills on xarelto.    Dyspnea:  flat surfaces slow / [employer]  Cough: none  Sleeping: cpap from [place] /10 degrees electric  SABA use: rarely  02: none  Covid status:   all x bivalent    No obvious day to day or daytime variability or assoc excess/ purulent sputum or mucus plugs or hemoptysis or cp or chest tightness, subjective wheeze or overt sinus or hb symptoms.   Sleeping as above   without nocturnal  or early am exacerbation  of respiratory  c/o's or need for noct saba. Also denies any obvious fluctuation of symptoms with weather or environmental changes or other aggravating or alleviating factors except as outlined above   No unusual exposure hx or h/o childhood pna/ asthma or knowledge of premature birth.  Current Allergies, Complete Past Medical History, Past Surgical History, Family History, and Social History were reviewed in [employer] record.  ROS  The following are not active complaints unless bolded Hoarseness, sore throat, dysphagia, dental problems, itching, sneezing,  nasal congestion or discharge of excess mucus or purulent secretions, ear ache,   fever, chills, sweats, unintended wt loss or wt gain, classically pleuritic or exertional cp,  orthopnea pnd or arm/hand swelling  or leg swelling, presyncope, palpitations, abdominal pain, anorexia, nausea, vomiting, diarrhea  or change in bowel habits or change in bladder habits, change in stools or change in urine, dysuria, hematuria,  rash,  arthralgias, visual complaints, headache, numbness, weakness or ataxia or problems with walking or coordination,  change in mood or  memory.        Current Meds  Medication Sig  albuterol (PROVENTIL) (2.5 MG/3ML) 0.083% nebulizer solution Take 3 mLs (2.5 mg total) by nebulization every 6 (six) hours as needed for wheezing or shortness of breath.   allopurinol (ZYLOPRIM) 100 MG tablet TAKE 1 TABLET BY MOUTH  DAILY   atorvastatin (LIPITOR) 80 MG tablet Take 80 mg by mouth every evening.   budesonide-formoterol (SYMBICORT) 80-4.5 MCG/ACT inhaler Inhale 2 puffs into the lungs daily.   celecoxib (CELEBREX) 200 MG capsule TAKE 1 CAPSULE BY MOUTH  TWICE DAILY   EPINEPHrine 0.3 mg/0.3 mL IJ SOAJ injection Inject 0.3 mg into the muscle as needed for anaphylaxis.    famotidine (PEPCID) 20 MG tablet Take 1 tablet (20 mg total) by mouth at bedtime.   fenofibrate 160 MG tablet TAKE 1 TABLET BY MOUTH  DAILY   Ferrous Sulfate (IRON PO) Take 1 tablet by mouth daily.   fluticasone (FLONASE) 50 MCG/ACT nasal spray Place 2 sprays into both nostrils daily.   furosemide (LASIX) 40 MG tablet Take 40 mg by mouth See admin instructions. 40mg  in the morning  20 mg in evening   gabapentin (NEURONTIN) 100 MG capsule TAKE 2 CAPSULES BY MOUTH AT BEDTIME   levocetirizine (XYZAL) 5 MG tablet TAKE 1 TABLET BY MOUTH IN  THE EVENING   magnesium oxide (MAG-OX) 400 (241.3 Mg) MG tablet Take 1 tablet (400 mg total) by mouth daily.   meclizine (ANTIVERT) 25 MG tablet Take 1 tablet (25 mg total) by mouth 3 (three) times daily as needed for dizziness.   memantine (NAMENDA) 10 MG tablet TAKE 2 TABLETS BY MOUTH  DAILY   metFORMIN (GLUCOPHAGE) 500 MG tablet TAKE 1 TABLET BY MOUTH  TWICE DAILY WITH A MEAL   metoCLOPramide (REGLAN) 5 MG tablet Take 1 tablet (5 mg total) by mouth 4 (four) times daily -  before meals and at bedtime.   metoprolol tartrate (LOPRESSOR) 50 MG tablet Take 50 mg by mouth 2 (two) times daily.   Multiple  Vitamins-Minerals (ONE-A-DAY WEIGHT SMART ADVANCE PO) Take 1 tablet by mouth daily. Centrum Silver   nitroGLYCERIN (NITROSTAT) 0.4 MG SL tablet Place 0.4 mg under the tongue every 5 (five) minutes as needed for chest pain.   omeprazole (PRILOSEC) 40 MG capsule Take 40 mg by mouth 2 (two) times daily.   ONETOUCH ULTRA test strip USE AS DIRECTED THREE TIMES DAILY.   potassium chloride SA (KLOR-CON) 20 MEQ tablet TAKE 2 TABLETS BY MOUTH  DAILY (Patient taking differently: Take 20 mEq by mouth 2 (two) times daily.)   PROAIR HFA 108 (90 Base) MCG/ACT inhaler Inhale 1 puff into the lungs every 6 (six) hours as needed for wheezing or shortness of breath.   Semaglutide,0.25 or 0.5MG /DOS, (OZEMPIC, 0.25 OR 0.5 MG/DOSE,) 2 MG/1.5ML SOPN Inject 0.5 mg into the skin every Sunday.   SUMAtriptan (IMITREX) 50 MG tablet Take 1 tablet as needed for migraine/vertigo. Do not take more than 3 a week (Patient taking differently: Take 50 mg by mouth every 2 (two) hours as needed for migraine. Max 3 tabs per week)   tadalafil (CIALIS) 20 MG tablet Take 0.5-1 tablets (10-20 mg total) by mouth every other day as needed for erectile dysfunction.   topiramate (TOPAMAX) 50 MG tablet Take 1 tablet (50 mg total) by mouth 2 (two) times daily.   vitamin B-12 (CYANOCOBALAMIN) 1000 MCG tablet Take 1,000 mcg by mouth daily.   XARELTO 20 MG TABS tablet TAKE 1 TABLET BY MOUTH DAILY  WITH SUPPER  Objective:   Physical Exam  wts  01/29/2022          211  10/19/2020      223 12/12/2017      235 06/17/2017        231  02/10/2017        230  02/03/2017        229  05/02/2016        226  03/21/16 226 lb 3.2 oz (102.604 kg)  03/11/16 222 lb 6.4 oz (100.88 kg)  03/06/16 229 lb (103.874 kg)     Vital signs reviewed  01/29/2022  - Note at rest 02 sats  97% on RA   General appearance:    amb (with cane)    HEENT : pt wearing mask not removed for exam due to covid -19 concerns.    NECK :  without JVD/Nodes/TM/ nl carotid  upstrokes bilaterally   LUNGS: no acc muscle use,  Nl contour chest which is clear to A and P bilaterally without cough on insp or exp maneuvers   CV:  RRR  no s3 or murmur or increase in P2, and no edema   ABD:  mod obese soft and nontender with nl inspiratory excursion in the supine position. No bruits or organomegaly appreciated, bowel sounds nl  MS: uses cane , slow gait/ ext warm without deformities, calf tenderness, cyanosis or clubbing No obvious joint restrictions   SKIN: warm and dry without lesions    NEURO:  alert, approp, nl sensorium with  no motor or cerebellar deficits apparent.               Assessment & Plan:

## 2022-01-29 NOTE — Assessment & Plan Note (Signed)
02/03/2017  Walked RA x 3 laps @ 185 ft each stopped due to  End of study, nl pace,  Min sob/no desat   - legs weak  - Spirometry 02/03/2017  3 h p saba  wnl including curvature of f/v loop  - 01/29/2022   Walked on RA  x  one  lap(s) =  approx 250  ft  @ slow/cane pace, stopped due to sob with lowest 02 sats 95%   Limited more by conditioning / arthritis than any apparent pulmonary mechanism at this point

## 2022-02-07 ENCOUNTER — Other Ambulatory Visit: Payer: Self-pay

## 2022-02-07 ENCOUNTER — Emergency Department
Admission: EM | Admit: 2022-02-07 | Discharge: 2022-02-07 | Disposition: A | Discharge status: Home / Self Care | Payer: Medicare Other | Source: Home / Self Care

## 2022-02-07 DIAGNOSIS — R059 Cough, unspecified: Secondary | ICD-10-CM | POA: Diagnosis not present

## 2022-02-07 DIAGNOSIS — J0100 Acute maxillary sinusitis, unspecified: Secondary | ICD-10-CM | POA: Diagnosis not present

## 2022-02-07 MED ORDER — AMOXICILLIN-POT CLAVULANATE 875-125 MG PO TABS: 1.0000 | ORAL_TABLET | Freq: Two times a day (BID) | ORAL | 0 refills | Status: DC

## 2022-02-07 MED ORDER — BENZONATATE 200 MG PO CAPS: 200.0000 mg | ORAL_CAPSULE | Freq: Three times a day (TID) | ORAL | 0 refills | Status: AC | PRN

## 2022-02-07 NOTE — Discharge Instructions (Addendum)
Advised patient to take medication as directed with food to completion.  Advised patient may use Tessalon Perles daily or as needed for cough.  Encouraged patient to increase daily water intake while taking these medications.
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
## 2022-02-07 NOTE — ED Triage Notes (Signed)
Pt c/o cough, congestion and sore throat x 2 days. Mucinex and chloraseptic spay prn. Denies fever. Wife recently sick, tested neg for everything. Hx of sinus infections and pnuemonia.

## 2022-02-11 ENCOUNTER — Other Ambulatory Visit: Payer: Self-pay

## 2022-02-11 ENCOUNTER — Emergency Department (INDEPENDENT_AMBULATORY_CARE_PROVIDER_SITE_OTHER): Payer: Medicare Other

## 2022-02-11 ENCOUNTER — Emergency Department (INDEPENDENT_AMBULATORY_CARE_PROVIDER_SITE_OTHER)
Admission: EM | Admit: 2022-02-11 | Discharge: 2022-02-11 | Disposition: A | Discharge status: Home / Self Care | Payer: Medicare Other | Source: Home / Self Care

## 2022-02-11 DIAGNOSIS — R059 Cough, unspecified: Secondary | ICD-10-CM

## 2022-02-11 DIAGNOSIS — H903 Sensorineural hearing loss, bilateral: Secondary | ICD-10-CM | POA: Diagnosis not present

## 2022-02-11 DIAGNOSIS — R0602 Shortness of breath: Secondary | ICD-10-CM

## 2022-02-11 DIAGNOSIS — Z9621 Cochlear implant status: Secondary | ICD-10-CM | POA: Diagnosis not present

## 2022-02-11 MED ORDER — HYDROCODONE BIT-HOMATROP MBR 5-1.5 MG/5ML PO SOLN: 5.0000 mL | Freq: Four times a day (QID) | ORAL | 0 refills | Status: DC | PRN

## 2022-02-11 MED ORDER — AZITHROMYCIN 250 MG PO TABS: 250.0000 mg | ORAL_TABLET | Freq: Every day | ORAL | 0 refills | Status: DC

## 2022-02-11 NOTE — Discharge Instructions (Addendum)
Advised patient of chest x-ray results. Advised patient to continue to continue taking Augmentin until completion.  We will add Zithromax and Hycodan cough syrup today.  Advised patient to take medication as directed with food to completion.  Encouraged patient increase daily water intake while taking this medication.  Advised patient if symptoms worsen and/or unresolved please follow-up with PCP or here for further evaluation.

## 2022-02-11 NOTE — ED Triage Notes (Signed)
 Pt presents to Urgent Care with persistent cough and sob since visit to [facility] on 02/07/22--has been taking Augmentin since this time. Now also c/o diarrhea and weakness--states he tripped and fell this morning (no major injury).
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
## 2022-02-12 ENCOUNTER — Other Ambulatory Visit: Payer: Self-pay | Admitting: Family Medicine

## 2022-02-12 DIAGNOSIS — R413 Other amnesia: Secondary | ICD-10-CM

## 2022-02-17 ENCOUNTER — Emergency Department
Admission: EM | Admit: 2022-02-17 | Discharge: 2022-02-17 | Disposition: A | Discharge status: Home / Self Care | Payer: Medicare Other | Source: Home / Self Care

## 2022-02-17 ENCOUNTER — Other Ambulatory Visit: Payer: Self-pay

## 2022-02-17 DIAGNOSIS — Z7901 Long term (current) use of anticoagulants: Secondary | ICD-10-CM | POA: Diagnosis not present

## 2022-02-17 DIAGNOSIS — I2510 Atherosclerotic heart disease of native coronary artery without angina pectoris: Secondary | ICD-10-CM | POA: Diagnosis not present

## 2022-02-17 DIAGNOSIS — Z79891 Long term (current) use of opiate analgesic: Secondary | ICD-10-CM | POA: Diagnosis not present

## 2022-02-17 DIAGNOSIS — E785 Hyperlipidemia, unspecified: Secondary | ICD-10-CM | POA: Diagnosis not present

## 2022-02-17 DIAGNOSIS — E1122 Type 2 diabetes mellitus with diabetic chronic kidney disease: Secondary | ICD-10-CM | POA: Diagnosis not present

## 2022-02-17 DIAGNOSIS — R000 Tachycardia, unspecified: Secondary | ICD-10-CM | POA: Diagnosis not present

## 2022-02-17 DIAGNOSIS — J3489 Other specified disorders of nose and nasal sinuses: Secondary | ICD-10-CM | POA: Diagnosis not present

## 2022-02-17 DIAGNOSIS — J329 Chronic sinusitis, unspecified: Secondary | ICD-10-CM | POA: Diagnosis not present

## 2022-02-17 DIAGNOSIS — K219 Gastro-esophageal reflux disease without esophagitis: Secondary | ICD-10-CM | POA: Diagnosis not present

## 2022-02-17 DIAGNOSIS — H903 Sensorineural hearing loss, bilateral: Secondary | ICD-10-CM | POA: Insufficient documentation

## 2022-02-17 DIAGNOSIS — E872 Acidosis, unspecified: Secondary | ICD-10-CM | POA: Insufficient documentation

## 2022-02-17 DIAGNOSIS — N182 Chronic kidney disease, stage 2 (mild): Secondary | ICD-10-CM | POA: Diagnosis not present

## 2022-02-17 DIAGNOSIS — Z79899 Other long term (current) drug therapy: Secondary | ICD-10-CM | POA: Diagnosis not present

## 2022-02-17 DIAGNOSIS — J0121 Acute recurrent ethmoidal sinusitis: Secondary | ICD-10-CM | POA: Diagnosis not present

## 2022-02-17 DIAGNOSIS — Z7951 Long term (current) use of inhaled steroids: Secondary | ICD-10-CM | POA: Diagnosis not present

## 2022-02-17 DIAGNOSIS — R918 Other nonspecific abnormal finding of lung field: Secondary | ICD-10-CM | POA: Diagnosis not present

## 2022-02-17 DIAGNOSIS — J984 Other disorders of lung: Secondary | ICD-10-CM | POA: Diagnosis not present

## 2022-02-17 DIAGNOSIS — G4733 Obstructive sleep apnea (adult) (pediatric): Secondary | ICD-10-CM | POA: Diagnosis not present

## 2022-02-17 DIAGNOSIS — E1169 Type 2 diabetes mellitus with other specified complication: Secondary | ICD-10-CM | POA: Diagnosis not present

## 2022-02-17 DIAGNOSIS — D6862 Lupus anticoagulant syndrome: Secondary | ICD-10-CM | POA: Diagnosis not present

## 2022-02-17 DIAGNOSIS — Z20822 Contact with and (suspected) exposure to covid-19: Secondary | ICD-10-CM | POA: Diagnosis not present

## 2022-02-17 DIAGNOSIS — Z7984 Long term (current) use of oral hypoglycemic drugs: Secondary | ICD-10-CM | POA: Diagnosis not present

## 2022-02-17 DIAGNOSIS — I509 Heart failure, unspecified: Secondary | ICD-10-CM | POA: Diagnosis not present

## 2022-02-17 DIAGNOSIS — I129 Hypertensive chronic kidney disease with stage 1 through stage 4 chronic kidney disease, or unspecified chronic kidney disease: Secondary | ICD-10-CM | POA: Diagnosis not present

## 2022-02-17 DIAGNOSIS — R0602 Shortness of breath: Secondary | ICD-10-CM | POA: Diagnosis not present

## 2022-02-17 DIAGNOSIS — J45901 Unspecified asthma with (acute) exacerbation: Secondary | ICD-10-CM | POA: Diagnosis not present

## 2022-02-17 DIAGNOSIS — I130 Hypertensive heart and chronic kidney disease with heart failure and stage 1 through stage 4 chronic kidney disease, or unspecified chronic kidney disease: Secondary | ICD-10-CM | POA: Diagnosis not present

## 2022-02-17 DIAGNOSIS — J309 Allergic rhinitis, unspecified: Secondary | ICD-10-CM | POA: Diagnosis not present

## 2022-02-17 DIAGNOSIS — M159 Polyosteoarthritis, unspecified: Secondary | ICD-10-CM | POA: Diagnosis not present

## 2022-02-17 DIAGNOSIS — I2782 Chronic pulmonary embolism: Secondary | ICD-10-CM | POA: Diagnosis not present

## 2022-02-17 NOTE — ED Triage Notes (Addendum)
Pt presents to Urgent Care with c/o coughing, sob, and fever x 2.5 weeks. Reports doing no better since last visit and has had 2 courses of antibiotics. No COVID test done. Also c/o intermittent mid-sternal cp, more so when coughing. Denies nausea and radiating pain.

## 2022-02-17 NOTE — Discharge Instructions (Addendum)
Advised/instructed patient to go to [facility] now for further exacerbation evaluation of continued shortness of breath and persistent cough, especially considering your past medical history.  Advised patient he will need advanced imaging of his chest and labs to rule out CHF exacerbation and/or septicemia.  Patient agreed and verbalized understanding of these instructions and this plan of care this morning.

## 2022-02-17 NOTE — ED Notes (Signed)
Patient is being discharged from the Urgent Care and sent to the Emergency Department via personal vehicle . Per M. Ragan, FNP, patient is in need of higher level of care due to increased RR, HR, and O2 sat in low 90s. Patient is aware and verbalizes understanding of plan of care.  Vitals:   02/17/22 0824 02/17/22 0835  BP:  132/82  Pulse:  (!) 124  Resp:  (!) 32  Temp:  100.2 F (37.9 C)  SpO2: 94% 94%
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
## 2022-02-19 ENCOUNTER — Other Ambulatory Visit: Payer: Self-pay | Admitting: Family Medicine

## 2022-02-20 ENCOUNTER — Other Ambulatory Visit: Payer: Self-pay | Admitting: Family Medicine

## 2022-02-20 ENCOUNTER — Telehealth: Payer: Self-pay | Admitting: Family Medicine

## 2022-02-20 MED ORDER — GLIPIZIDE 5 MG PO TABS: ORAL_TABLET | ORAL | 0 refills | Status: DC

## 2022-02-20 NOTE — Telephone Encounter (Signed)
Pt stated he was in the hospital for pneumonia, and has been prescribed steroids to help. He thinks this is effecting his blood sugar as he cannot get it below 295. Pt transferred to triage to speak with a nurse. Please advise.   ?

## 2022-02-20 NOTE — Addendum Note (Signed)
Addended by: Ames Coupe on: 02/20/2022 04:23 PM ? ? Modules accepted: Orders ? ?

## 2022-02-20 NOTE — Telephone Encounter (Signed)
Pt was discharged from the hospital yesterday 2/29/23. On steroids for the next 3 days for Pneumonia. Blood sugar was 295. He took 2 more tabs and now his glucose is: 250 ? ?Sxs are a headache and irritability. ? ?Please advise since Etter Sjogren is out.  ? ?Follow up scheduled for tomorrow at 3:20PM ?

## 2022-02-20 NOTE — Telephone Encounter (Signed)
Pt aware and voices understanding.   

## 2022-02-20 NOTE — Telephone Encounter (Signed)
I will send in a med to take with each dose of his steroid until he is seen. Ty.  ?

## 2022-02-21 ENCOUNTER — Ambulatory Visit (INDEPENDENT_AMBULATORY_CARE_PROVIDER_SITE_OTHER): Payer: Medicare Other | Admitting: Family Medicine

## 2022-02-21 VITALS — BP 128/82 | HR 74 | Temp 97.7°F | Resp 16 | Ht 65.0 in | Wt 212.2 lb

## 2022-02-21 DIAGNOSIS — J189 Pneumonia, unspecified organism: Secondary | ICD-10-CM | POA: Diagnosis not present

## 2022-02-21 MED ORDER — GABAPENTIN 100 MG PO CAPS: 200.0000 mg | ORAL_CAPSULE | Freq: Every day | ORAL | 3 refills | Status: DC

## 2022-02-21 MED ORDER — ALBUTEROL SULFATE (2.5 MG/3ML) 0.083% IN NEBU
2.5000 mg | INHALATION_SOLUTION | Freq: Four times a day (QID) | RESPIRATORY_TRACT | 1 refills | Status: DC | PRN
Start: 2022-02-21 — End: 2023-07-28

## 2022-02-21 NOTE — Patient Instructions (Signed)
Community-Acquired Pneumonia, Adult ?Pneumonia is a lung infection that causes inflammation and the buildup of mucus and fluids in the lungs. This may cause coughing and difficulty breathing. Community-acquired pneumonia is pneumonia that develops in people who are not, and have not recently been, in a hospital or other health care facility. ?Usually, pneumonia develops as a result of an illness that is caused by a virus, such as the common cold and the flu (influenza). It can also be caused by bacteria or fungi. While the common cold and influenza can pass from person to person (are contagious), pneumonia itself is not considered contagious. ?What are the causes? ?This condition may be caused by: ?Viruses. ?Bacteria. ?Fungi, such as molds or mushrooms. ?What increases the risk? ?The following factors may make you more likely to develop this condition: ?Having certain medical conditions, such as: ?A long-term (chronic) disease, which may include chronic obstructive pulmonary disease (COPD), asthma, heart failure, cystic fibrosis, diabetes, kidney disease, sickle cell disease, and human immunodeficiency virus (HIV). ?A condition that increases the risk of breathing in (aspirating) mucus and other fluids from your mouth and nose. ?A weakened body defense system (immune system). ?Having had your spleen removed (splenectomy). The spleen is the organ that helps fight germs and infections. ?Not cleaning your teeth and gums well (poor dental hygiene). ?Using tobacco products. ?Traveling to places where germs that cause pneumonia are present. ?Being near certain animals, or animal habitats, that have germs that cause pneumonia. ?Being older than 72 years of age. ?What are the signs or symptoms? ?Symptoms of this condition include: ?A dry cough or a wet (productive) cough. ?A fever. ?Sweating or chills. ?Chest pain, especially when breathing deeply or coughing. ?Fast breathing, difficulty breathing, or shortness of  breath. ?Tiredness (fatigue). ?Muscle aches. ?How is this diagnosed? ?This condition may be diagnosed based on your medical history or a physical exam. You may also have tests, including: ?Chest X-rays. ?Tests of the level of oxygen and other gases in your blood. ?Tests of: ?Your blood. ?Mucus from your lungs (sputum). ?Fluid around your lungs (pleural fluid). ?Your urine. ?If your pneumonia is severe, other tests may be done to learn more about the cause. ?How is this treated? ?Treatment for this condition depends on many factors, such as the cause of your pneumonia, your medicines, and other medical conditions that you have. ?For most adults, pneumonia may be treated at home. In some cases, treatment must happen in a hospital and may include: ?Medicines that are given by mouth (orally) or through an IV, including: ?Antibiotic medicines, if bacteria caused the pneumonia. ?Medicines that kill viruses (antiviral medicines), if a virus caused the pneumonia. ?Oxygen therapy. ?Severe pneumonia, although rare, may require the following treatments: ?Mechanical ventilation.This procedure uses a machine to help you breathe if you cannot breathe well on your own or maintain a safe level of blood oxygen. ?Thoracentesis. This procedure removes any buildup of pleural fluid to help with breathing. ?Follow these instructions at home: ?Medicines ?Take over-the-counter and prescription medicines only as told by your health care provider. ?Take cough medicine only if you have trouble sleeping. Cough medicine can prevent your body from removing mucus from your lungs. ?If you were prescribed an antibiotic medicine, take it as told by your health care provider. Do not stop taking the antibiotic even if you start to feel better. ?Lifestyle ?  ?Do not drink alcohol. ?Do not use any products that contain nicotine or tobacco, such as cigarettes, e-cigarettes, and chewing tobacco. If you   need help quitting, ask your health care  provider. ?Eat a healthy diet. This includes plenty of vegetables, fruits, whole grains, low-fat dairy products, and lean protein. ?General instructions ?Rest a lot and get at least 8 hours of sleep each night. ?Sleep in a partly upright position at night. Place a few pillows under your head or sleep in a reclining chair. ?Return to your normal activities as told by your health care provider. Ask your health care provider what activities are safe for you. ?Drink enough fluid to keep your urine pale yellow. This helps to thin the mucus in your lungs. ?If your throat is sore, gargle with a salt-water mixture 3-4 times a day or as needed. To make a salt-water mixture, completely dissolve ?-1 tsp (3-6 g) of salt in 1 cup (237 mL) of warm water. ?Keep all follow-up visits as told by your health care provider. This is important. ?How is this prevented? ?You can lower your risk of developing community-acquired pneumonia by: ?Getting the pneumonia vaccine. There are different types and schedules of pneumonia vaccines. Ask your health care provider which option is best for you. Consider getting the pneumonia vaccine if: ?You are older than 72 years of age. ?You are 19-65 years of age and are receiving cancer treatment, have chronic lung disease, or have other medical conditions that affect your immune system. Ask your health care provider if this applies to you. ?Getting your influenza vaccine every year. Ask your health care provider which type of vaccine is best for you. ?Getting regular dental checkups. ?Washing your hands often with soap and water for at least 20 seconds. If soap and water are not available, use hand sanitizer. ?Contact a health care provider if you have: ?A fever. ?Trouble sleeping because you cannot control your cough with cough medicine. ?Get help right away if: ?Your shortness of breath becomes worse. ?Your chest pain increases. ?Your sickness becomes worse, especially if you are an older adult or  have a weak immune system. ?You cough up blood. ?These symptoms may represent a serious problem that is an emergency. Do not wait to see if the symptoms will go away. Get medical help right away. Call your local emergency services (911 in the [place] Do not drive yourself to the hospital. ?Summary ?Pneumonia is an infection of the lungs. ?Community-acquired pneumonia develops in people who have not been in the hospital. It can be caused by bacteria, viruses, or fungi. ?This condition may be treated with antibiotics or antiviral medicines. ?Severe pneumonia may require a hospital stay and treatment to help with breathing. ?This information is not intended to replace advice given to you by your health care provider. Make sure you discuss any questions you have with your health care provider. ?Document Revised: 09/21/2019 Document Reviewed: 09/21/2019 ?Elsevier Patient Education ? 2022 [employer] ? ?
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
## 2022-02-24 ENCOUNTER — Encounter: Payer: Self-pay | Admitting: Family Medicine

## 2022-02-24 NOTE — Assessment & Plan Note (Addendum)
Finished abx ?Recheck cxr in a few weeks f/u pulmonary already set up ?

## 2022-02-25 ENCOUNTER — Ambulatory Visit (INDEPENDENT_AMBULATORY_CARE_PROVIDER_SITE_OTHER): Payer: Medicare Other | Admitting: Family Medicine

## 2022-02-25 ENCOUNTER — Encounter: Payer: Self-pay | Admitting: Family Medicine

## 2022-02-25 ENCOUNTER — Ambulatory Visit (INDEPENDENT_AMBULATORY_CARE_PROVIDER_SITE_OTHER): Payer: Medicare Other | Admitting: Pharmacist

## 2022-02-25 VITALS — BP 110/80 | HR 70 | Temp 98.3°F | Resp 20 | Ht 65.0 in | Wt 208.8 lb

## 2022-02-25 DIAGNOSIS — E1121 Type 2 diabetes mellitus with diabetic nephropathy: Secondary | ICD-10-CM

## 2022-02-25 DIAGNOSIS — R296 Repeated falls: Secondary | ICD-10-CM | POA: Diagnosis not present

## 2022-02-25 DIAGNOSIS — Z7901 Long term (current) use of anticoagulants: Secondary | ICD-10-CM

## 2022-02-25 DIAGNOSIS — E1169 Type 2 diabetes mellitus with other specified complication: Secondary | ICD-10-CM

## 2022-02-25 DIAGNOSIS — R29898 Other symptoms and signs involving the musculoskeletal system: Secondary | ICD-10-CM | POA: Diagnosis not present
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
## 2022-02-25 NOTE — Patient Instructions (Signed)
[name] ?It was a pleasure speaking with you today.  ?I have attached a summary of our visit today and information about your health goals.  ?(See Below) ? ?If you have any questions or concerns, please feel free to contact me either at the phone number below or with a MyChart message.  ? ?Keep up the good work! ? ?Cherre Robins, PharmD ?Clinical Pharmacist ?[facility] ?[place] ?[phone] (direct line)  ?[phone] (main office number) ? ? ?Chronic Care Management Care Plan (updated 02/25/2022) ?Hypertension ?BP Readings from Last 3 Encounters:  ?02/25/22 110/80  ?02/21/22 128/82  ?02/17/22 132/82  ? ?Pharmacist Clinical Goal(s): ?Over the next 90 days, patient will work with PharmD and providers to maintain BP goal <140/90 ?Current regimen:  ?Verapamil '240mg'$  daily at bedtime ?Metoprolol tartrate '50mg'$  twice a day ?Interventions: ?Discussed blood pressure goal ?Updated medication list to reflect increase in metoprolol after last visit with cardiology  ?Patient self care activities - Over the next 90 days, patient will: ?Check blood pressure 2 to 3 time per week, document, and provide at future appointments ?Ensure daily salt intake < 2300 mg/day ? ?Hyperlipidemia ?Lab Results  ?Component Value Date/Time  ? LDLCALC 63 11/12/2021 11:14 AM  ? LDLCALC 129 (H) 08/17/2020 09:48 AM  ? LDLCALC 99 07/18/2015 07:44 AM  ? LDLDIRECT 104.0 02/14/2020 10:25 AM  ? ?Pharmacist Clinical Goal(s): ?Over the next 90 days, patient will work with PharmD and providers to maintain LDL goal < 70 ?Current regimen:  ?Atorvastatin '80mg'$  daily  ?Fenofibrate '160mg'$  daily ?Interventions: ?Discussed LDL goal ?Patient self care activities - Over the next 90 days, patient will: ?Maintain cholesterol medication regimen. ?Great job - your LDL is now at goal.  ? ?Diabetes ?Lab Results  ?Component Value Date/Time  ? HGBA1C 7.2 (H) 11/12/2021 11:14 AM  ? HGBA1C 7.5 (H) 08/17/2021 05:29 AM  ? ?A1 wa 6.5% 01/2022 at [facility] facility ? ?Pharmacist Clinical Goal(s): ?Over the next 90 days, patient will work with PharmD and providers to achieve A1c goal <7% ?Current regimen:  ?Metformin '500mg'$  twice a day with food ?Ozampic 0.'5mg'$  once per week ?Interventions: ?Discussed A1c goals ?3557 application for Ozempic patient assistance program still in process ?Reviewed home blood glucose readings and reviewed goals  ?Fasting blood glucose goal (before meals) = 80 to 130 ?Blood glucose goal after a meal = less than 180  ?Patient self care activities - Over the next 90 days, patient will: ?Check blood sugar once daily, document, and provide at future appointments ?Continue Ozempic 0.'5mg'$  inject subcutaneously once a week  ?Contact provider with any episodes of hypoglycemia ?Bring in 2023 social security monthly benefits documentation ? ?History of Pulmonary embolism (blood clot)  ?Pharmacist Clinical Goal(s) ?Over the next 90 days, patient will work with PharmD and providers to prevent recurrence of blood clots and prevent stroke ?Current regimen:  ?Xarelto '20mg'$  daily with supper ?Interventions: ?Screened for patient assistance program (unfortunately patient did not qualify in 2022)  ?Patient self care activities - Over the next 90 days, patient will: ?Continue to take Xarelto daily ? ?Asthma: ?Pharmacist Clinical Goal(s) ?Over the next 90 days, patient will work with PharmD and providers to improve breathing and prevent hospitalizations related to asthma exacerbations ?Current regimen:  ?Symbicort 80/4.74mg - inhaler 2 puffs into lungs once a day ?Albuterol inhaler or nebulization solution - use as needed for shortness of breath or wheezing ?Interventions: ?Discussed maintenance versus rescue therapy.  ?Discussed seasonal as the triggers  ?Patient self  care activities - Over the next 90 days, patient will: ?Continue to follow up with Dr Melvyn Novas  ?Continue above medication regimen.  ? ?Medication management ?Pharmacist Clinical Goal(s): ?Over  the next 90 days, patient will work with PharmD and providers to maintain optimal medication adherence ?Current pharmacy: [facility] ?Interventions ?Comprehensive medication review performed. ?Performed review of medications from recent hospitalization and discharge summary.  ?Continue current medication management strategy ?Patient self care activities - Over the next 90 days, patient will: ?Focus on medication adherence by filling and taking medications appropriately  ?Take medications as prescribed ?Report any questions or concerns to PharmD and/or provider(s) ? ?Patient Goals/Self-Care Activities ?Over the next 90 days, patient will:  ?take medications as prescribed,  ?check glucose daily, document, and provide at future appointments,  ?collaborate with provider on medication access solutions. ?Bring in copy of social security benefits statement for 2023.  ? ?Please see past updates related to this goal by clicking on the "Past Updates" button in the selected goal  ? ?Patient verbalizes understanding of instructions and care plan provided today and agrees to view in [place]. Active MyChart status confirmed with patient.    ?

## 2022-02-25 NOTE — Assessment & Plan Note (Signed)
Pt has been to PT ?Refer to neuro ?N/S stated the back looks good from surgical standpoint ?

## 2022-02-25 NOTE — Chronic Care Management (AMB) (Signed)
Chronic Care Management Pharmacy Note  02/25/2022 Name:  Garrett [name] MRN:  [id number] DOB:  1951/12/15  Summary:  [employer] patient assistance program to check on application sent in January for [place]. Representative states that online record for medicare monthly payment / income was not accepted, must send in copy of yearly letter stating patients monthly social security benefit amount. Discussed with patient today and he will get needed income verification bring back to the office. Patient was provided with 1 month sample of Ozempic until we can resubmit application with needed income documents Subjective: Garrett [name] is an 71 y.o. year old male who is a primary patient of Ann Held, DO.  The CCM team was consulted for assistance with disease management and care coordination needs.    Engaged with patient by telephone for follow up visit in response to provider referral for pharmacy case management and/or care coordination services.   Consent to Services:  The patient was given information about Chronic Care Management services, agreed to services, and gave verbal consent prior to initiation of services.  Please see initial visit note for detailed documentation.   Patient Care Team: Carollee Herter, Alferd Apa, DO as PCP - General (Family Medicine) Milus Banister, MD (Gastroenterology) [name], MD (Neurosurgery) Tanda Rockers, MD as Consulting Physician (Pulmonary Disease) Cameron Sprang, MD as Consulting Physician (Neurology) [name], MD as Consulting Physician (Neurosurgery) Marlaine Hind, MD as Consulting Physician (Physical Medicine and Rehabilitation) Cherre Robins, [place] (Pharmacist) Ralene Ok, MD as Consulting Physician (General Surgery) Gardiner Barefoot, DPM as Consulting Physician (Podiatry)  Recent office visits: 02/21/2022 - Fam Med (Dr Carollee Herter) F/U hospitalization for pneumonia. Prescribed albuterol neb solution to use  up to every 6 hours as needed. Refilled gabapentin 11/12/2021 - Fam Med (Dr Etter Sjogren) F/U chronic condition. A1c noted to be stable. No medication changes.   Recent consult visits: 09/28/2021 - Heart and [facility] (Dr Lamonte Sakai - [facility]) Seen for f/u CAD. No med changes. F/U 6 months. 09/25/2021 - Surgeon (Dr [name] - [facility]) Seen for hiatal hernia. Patient would like to avoid surgery if possible. Suggested esophageal dilation after appt with Dr [name] / GI.  09/21/2021 - Pumlonary Warner Mccreedy, NP) F/U cough variant asthma. Recommended chest eray in 1 to 2 weeks. No medication changes.  09/12/2021 - Podiatry (Dr Prudence Davidson) diabetic foot care - debrided nails. No med changes.  08/14/2021 - GI (Dr [name]) Colonoscopy and EGD. Noted recurrent hiatal hernia. Dilated GE gunction. Two polyps removed. Noted to have diverticula.  Hospital visits: 02/17/2022 - to 02/19/2022 [facility] Admission at [facility] for pneumonia and asthma exacerbation.. New medications: cefdinir 300mg  twice a day for 4 more days; Mucinex DM every 12 hours for 10 days; prednisone 20mg  daily for 3 days.  02/17/2022 - Urgent Care at [facility] seen for cough adn SOB. Advised to go to ED / hospital.  02/11/2022 - Urgent Care at [facility] seen for cough and SOB. Prescrived azithrymycin 250mg  for 5 days; hydrocodone syrup as needed for cough.  01/28/2022 - Urgent Care at [facility] seen for acute maxillary sinusitis and cough. Prescribed Augmentin bid for 7 days and benzonatate Performed medication review today to reconcile discharge medications and medication list in Epic.  08/14/2021 to 08/20/2021 Hospital Admission to [facility] for severe sepsis due to left sided pneumonia.  Medications Started at Discharge: Augmentin 875mg  - take 1 tablet every 12  hours for 4 days.  guaiFENesin-dextromethorphan (ROBITUSSIN DM) syrup - Take 5 mLs by mouth every 4 (four) hours  as needed for cough magic mouthwash -Take 5 mLs by mouth 3 (three) times daily as needed for up to 5 days for mouth pain.,  Medications Stopped at Discharge - None Medications changeds at Discharge - None  Objective:  Lab Results  Component Value Date   CREATININE 1.16 11/12/2021   CREATININE 1.41 08/30/2021   CREATININE 1.11 08/19/2021    Lab Results  Component Value Date   HGBA1C 7.2 (H) 11/12/2021   Last diabetic Eye exam:  Lab Results  Component Value Date/Time   HMDIABEYEEXA No Retinopathy 12/21/2018 12:34 PM    Last diabetic Foot exam: No results found for: HMDIABFOOTEX      Component Value Date/Time   CHOL 135 11/12/2021 1114   CHOL 162 07/18/2015 0744   TRIG 200.0 (H) 11/12/2021 1114   TRIG 110 07/18/2015 0744   HDL 32.00 (L) 11/12/2021 1114   HDL 41 07/18/2015 0744   CHOLHDL 4 11/12/2021 1114   VLDL 40.0 11/12/2021 1114   LDLCALC 63 11/12/2021 1114   LDLCALC 129 (H) 08/17/2020 0948   LDLCALC 99 07/18/2015 0744   LDLDIRECT 104.0 02/14/2020 1025    Hepatic Function Latest Ref Rng & Units 11/12/2021 08/30/2021 08/15/2021  Total Protein 6.0 - 8.3 g/dL 6.5 7.2 5.5(L)  Albumin 3.5 - 5.2 g/dL 4.0 4.1 2.9(L)  AST 0 - 37 U/L 45(H) 29 28  ALT 0 - 53 U/L 52 43 39  Alk Phosphatase 39 - 117 U/L 45 60 39  Total Bilirubin 0.2 - 1.2 mg/dL 0.4 0.6 0.8  Bilirubin, Direct 0.0 - 0.3 mg/dL - - -    Lab Results  Component Value Date/Time   TSH 1.30 04/28/2018 10:16 AM   TSH 1.24 02/03/2017 10:50 AM    CBC Latest Ref Rng & Units 11/12/2021 08/30/2021 08/17/2021  WBC 4.0 - 10.5 K/uL 7.4 7.0 10.0  Hemoglobin 13.0 - 17.0 g/dL 12.0(L) 12.8(L) 10.4(L)  Hematocrit 39.0 - 52.0 % 36.6(L) 39.8 33.3(L)  Platelets 150.0 - 400.0 K/uL 261.0 362.0 229    No results found for: VD25OH  Clinical ASCVD: Yes  The 10-year ASCVD risk score (Arnett DK, et al., 2019) is: 31.1%   Values used to calculate the score:     Age: 60 years     Sex: Male     Is Non-Hispanic African American: No      Diabetic: Yes     Tobacco smoker: No     Systolic Blood Pressure: 948 mmHg     Is BP treated: Yes     HDL Cholesterol: 32 mg/dL     Total Cholesterol: 135 mg/dL      Social History   Tobacco Use  Smoking Status Former   Packs/day: 3.00   Years: 30.00   Pack years: 90.00   Types: Cigarettes   Quit date: 01/09/1991   Years since quitting: 31.1  Smokeless Tobacco Former   Types: Snuff   BP Readings from Last 3 Encounters:  02/25/22 110/80  02/21/22 128/82  02/17/22 132/82   Pulse Readings from Last 3 Encounters:  02/25/22 70  02/21/22 74  02/17/22 (!) 124   Wt Readings from Last 3 Encounters:  02/25/22 208 lb 12.8 oz (94.7 kg)  02/21/22 212 lb 3.2 oz (96.3 kg)  02/17/22 212 lb (96.2 kg)    Assessment: Review of patient past medical history, allergies, medications, health status, including review of consultants  reports, laboratory and other test data, was performed as part of comprehensive evaluation and provision of chronic care management services.   SDOH:  (Social Determinants of Health) assessments and interventions performed:      CCM Care Plan  Allergies  Allergen Reactions   Bee Venom Anaphylaxis   Garlic Swelling    Medications Reviewed Today     Reviewed by Cherre Robins, RPH-CPP (Pharmacist) on 02/25/22 at 1211  Med List Status: <None>   Medication Order Taking? Sig Documenting Provider Last Dose Status Informant  albuterol (PROVENTIL) (2.5 MG/3ML) 0.083% nebulizer solution [id number] Yes Take 3 mLs (2.5 mg total) by nebulization every 6 (six) hours as needed for wheezing or shortness of breath. Carollee Herter, Kendrick Fries R, DO Taking Active   allopurinol (ZYLOPRIM) 100 MG tablet [id number] Yes TAKE 1 TABLET BY MOUTH  DAILY Ann Held, DO Taking Active Self  atorvastatin (LIPITOR) 80 MG tablet [id number] Yes Take 80 mg by mouth every evening. [provider] Taking Active Self  budesonide-formoterol (SYMBICORT) 80-4.5 MCG/ACT inhaler  [id number] Yes Inhale 2 puffs into the lungs daily. Carollee Herter, Kendrick Fries R, DO Taking Active   celecoxib (CELEBREX) 200 MG capsule [id number] Yes TAKE 1 CAPSULE BY MOUTH  TWICE DAILY Carollee Herter, Alferd Apa, DO Taking Active Self  dextromethorphan-guaiFENesin (MUCINEX DM) 30-600 MG 12hr tablet [id number] Yes Take 1 tablet by mouth 2 (two) times daily. [provider] Taking Active   EPINEPHrine 0.3 mg/0.3 mL IJ SOAJ injection [id number] Yes Inject 0.3 mg into the muscle as needed for anaphylaxis.  [provider] Taking Active Self  famotidine (PEPCID) 20 MG tablet [id number] Yes Take 1 tablet (20 mg total) by mouth at bedtime. Milus Banister, MD Taking Active Self  fenofibrate 160 MG tablet [id number] Yes TAKE 1 TABLET BY MOUTH  DAILY Ann Held, DO Taking Active   Ferrous Sulfate (IRON PO) [id number] Yes Take 1 tablet by mouth daily. [provider] Taking Active Self  fluticasone (FLONASE) 50 MCG/ACT nasal spray [id number] Yes Place 2 sprays into both nostrils daily. Ann Held, DO Taking Active Self  furosemide (LASIX) 40 MG tablet [id number] Yes Take 40 mg by mouth See admin instructions. 40mg  in the morning  20 mg in evening [provider] Taking Active Self  gabapentin (NEURONTIN) 100 MG capsule [id number] Yes Take 2 capsules (200 mg total) by mouth at bedtime. [name] R, DO Taking Active   glipiZIDE (GLUCOTROL) 5 MG tablet [id number] Yes Take 1 tab with every prednisone dose. Shelda Pal, DO Taking Active   levocetirizine (XYZAL) 5 MG tablet [id number] Yes TAKE 1 TABLET BY MOUTH IN  THE EVENING Ann Held, DO Taking Active   magnesium oxide (MAG-OX) 400 (241.3 Mg) MG tablet [id number] Yes Take 1 tablet (400 mg total) by mouth daily. Reyne Dumas, MD Taking Active Self  meclizine (ANTIVERT) 25 MG tablet [id number] Yes Take 1 tablet (25 mg total) by mouth 3 (three) times daily as needed for dizziness. [name] R, DO Taking Active Self  memantine (NAMENDA) 10 MG tablet [id number] Yes TAKE 2 TABLETS BY MOUTH  DAILY Carollee Herter, Alferd Apa, DO Taking Active   metFORMIN (GLUCOPHAGE) 500 MG tablet [id number] Yes TAKE 1 TABLET BY MOUTH  TWICE DAILY WITH A MEAL Ann Held, DO Taking Active Self  metoCLOPramide (REGLAN) 5 MG tablet [id number] Yes Take 1 tablet (5 mg total) by mouth 4 (four) times daily -  before meals  and at bedtime. Milus Banister, MD Taking Active   metoprolol tartrate (LOPRESSOR) 50 MG tablet [id number] Yes Take 50 mg by mouth 2 (two) times daily. [provider] Taking Active Self  Multiple Vitamins-Minerals (ONE-A-DAY WEIGHT SMART ADVANCE PO) [id number] Yes Take 1 tablet by mouth daily. Centrum Silver [provider] Taking Active Self  nitroGLYCERIN (NITROSTAT) 0.4 MG SL tablet [id number] Yes Place 0.4 mg under the tongue every 5 (five) minutes as needed for chest pain. [provider] Taking Active Self  omeprazole (PRILOSEC) 40 MG capsule [id number] Yes Take 40 mg by mouth 2 (two) times daily. [provider] Taking Active Self  Donald Siva test strip [id number] Yes USE AS DIRECTED THREE TIMES DAILY. Ann Held, DO Taking Active Self  PROAIR HFA 108 [phone] Base) MCG/ACT inhaler [id number] Yes Inhale 1 puff into the lungs every 6 (six) hours as needed for wheezing or shortness of breath. Ann Held, DO Taking Active Self  Pseudoeph-HYDROcodone (P-V TUSSIN PO) [id number] Yes Take by mouth. [provider] Taking Active   rivaroxaban (XARELTO) 20 MG TABS tablet [id number] Yes Take 1 tablet (20 mg total) by mouth daily with supper. Tanda Rockers, MD Taking Active   Semaglutide,0.25 or 0.5MG /DOS, (OZEMPIC, 0.25 OR 0.5 MG/DOSE,) 2 MG/1.5ML SOPN [id number] Yes Inject 0.5 mg into the skin every Sunday. [provider] Taking Active Self  tadalafil (CIALIS) 20 MG tablet [id number] Yes Take 0.5-1 tablets (10-20 mg total)  by mouth every other day as needed for erectile dysfunction. Ann Held, DO Taking Active Self  topiramate (TOPAMAX) 50 MG tablet [id number] Yes Take 1 tablet (50 mg total) by mouth 2 (two) times daily. Cameron Sprang, MD Taking Active Self  vitamin B-12 (CYANOCOBALAMIN) 1000 MCG tablet [id number] Yes Take 1,000 mcg by mouth daily. [provider] Taking Active Self            Patient Active Problem List   Diagnosis Date Noted   Weakness of both lower extremities 02/25/2022   Frequent falls 02/25/2022   Pneumonia due to infectious organism 02/21/2022   Severe sepsis ([place]) 08/14/2021   Pneumonia 08/14/2021   Lactic acidosis 08/14/2021   Hypokalemia 08/14/2021   DOE (dyspnea on exertion) 05/11/2021   Abnormal stress test 04/20/2021   Acute respiratory distress    Acute respiratory failure with hypoxia (HCC)    S/P Nissen fundoplication (without gastrostomy tube) procedure 02/20/2021   Body mass index (BMI) 33.0-33.9, adult 11/03/2019   Laceration of right hand 08/02/2019   Acute pulmonary embolism ([place]) 01/07/2019   Chronic diastolic CHF (congestive heart failure) ([place]) 01/07/2019   Preventative health care 02/10/2018   Dizziness 02/10/2018   Spondylolisthesis at L3-L4 level 10/28/2017   Cough variant asthma  vs uacs/ pseudoasthma 06/17/2017   Pneumonia of right middle lobe due to infectious organism 02/28/2017   Sepsis ([place]) 02/28/2017   Pulmonary embolism and infarction ([place]) 02/09/2017   Bilateral pulmonary embolism ([place]) 02/04/2017   Greater trochanteric bursitis of left hip 01/14/2017   Greater trochanteric bursitis of right hip 12/20/2016   Degenerative arthritis of knee, bilateral 10/08/2016   Upper airway cough syndrome 03/22/2016   Multiple pulmonary nodules 03/22/2016   Acute bronchitis 01/22/2016   Headache disorder 01/04/2016   Acute upper respiratory infection 10/25/2015   Elevated CK 07/18/2015   History of colonic polyps 04/11/2015    Lumbar stenosis with neurogenic claudication 11/14/2014   Spondylolysis of cervical region 02/25/2014   Lumbosacral spondylosis without myelopathy  01/06/2014   OSA (obstructive sleep apnea) 12/08/2013   SOB (shortness of breath) on exertion 11/04/2013   Morbid obesity due to excess calories ([place])    Cervicalgia    Venous insufficiency of leg 02/18/2012   Itching 02/18/2012   Mild dementia 05/28/2011   Hyperlipidemia associated with type 2 diabetes mellitus ([place]) 05/27/2011   Controlled type 2 diabetes mellitus with diabetic nephropathy ([place]) 04/10/2011   Gout 04/10/2011   Essential hypertension 04/10/2011   OA (osteoarthritis) 04/10/2011   GERD (gastroesophageal reflux disease) 04/10/2011   Migraine headache without aura 04/10/2011   Allergic rhinitis, cause unspecified 04/10/2011   Bee sting allergy 04/10/2011    Immunization History  Administered Date(s) Administered   Fluad Quad(high Dose 65+) 10/20/2018, 10/07/2019, 10/19/2020, 08/30/2021   Influenza Split 09/30/2012   Influenza, High Dose Seasonal PF 09/19/2016, 10/07/2017, 10/16/2018   Influenza,inj,Quad PF,6+ Mos 09/15/2013, 09/26/2014, 09/22/2015   Influenza,inj,quad, With Preservative 09/15/2013, 09/26/2014, 09/22/2015   PFIZER(Purple Top)SARS-COV-2 Vaccination 02/12/2020, 03/07/2020, 08/19/2020   Pneumococcal Conjugate-13 07/18/2015   Pneumococcal Polysaccharide-23 04/10/2011, 09/19/2016   Tdap 04/10/2011, 04/09/2019, 07/22/2019   Zoster, Live 03/28/2014    Conditions to be addressed/monitored: CHF, CAD, HTN, HLD, Hypertriglyceridemia, DMII, and Asthma, neuropathy, migraine headaches, GERD, gout, mild anemia, OSA; recurrent PE with chronic anticoagulation  Care Plan : General Pharmacy (Adult)  Updates made by Cherre Robins, RPH-CPP since 02/25/2022 12:00 AM     Problem: asthma, hyperlipidemia; type 2 DM; CAD; CHF; HTN; recurrent PE; chronic anticoagulation; migraines; neuropathy; gout; GERD; mild dementia; vertigo;  OSA; seasonal allergies   Priority: High  Onset Date: 08/07/2021  Note:   Current Barriers:  Unable to maintain control of type 2 DM Does not adhere to prescribed medication regimen Education needed regarding medication regimen and chronic conditions  Pharmacist Clinical Goal(s):  Over the next 90 days, patient will achieve adherence to monitoring guidelines and medication adherence to achieve therapeutic efficacy achieve control of type 2 DM as evidenced by A1c < 7.0 maintain control of HTN, CHF, hyperlipidemia and asthma  as evidenced by attainment of goals listed below  through collaboration with PharmD and provider.   Interventions: 1:1 collaboration with Carollee Herter, Alferd Apa, DO regarding development and update of comprehensive plan of care as evidenced by provider attestation and co-signature Inter-disciplinary care team collaboration (see longitudinal plan of care) Comprehensive medication review performed; medication list updated in electronic medical record    Hypertension / CHF:  Controlled; Goal: BP <140/90, minimize symptoms of CHF and prevent CHF exacerbations Current regimen:  Verapamil 240mg  daily at bedtime Metoprolol tartrate 50mg  twice a day Furosemide 40mg  each morning and 20mg  each evening Potassium chloride 12mEq - take 2 tablets daily Type: Diastolic Last ejection fraction: 60-65% (08/16/2021) - previously on 03/03/17 EF: 65-70% NYHA Class: II (slight limitation of activity) AHA HF Stage: B (Heart disease present - no symptoms present) Patient has failed these meds in past: None noted  Last BNP was 123 (02/26/2021) Home blood pressure readings:  118 to 132 / 70s Interventions: (addressed at previous visit)  Discussed blood pressure goal Check blood pressure 2 to 3 time per week, document, and provide at future appointments Ensure daily salt intake < 2300 mg/day  Hyperlipidemia / CAD;  LDL at goal; LDL goal < 70 but triglycerides still > goal of  150 Current regimen:  Atorvastatin 80mg  daily  Fenofibrate 160mg  daily Interventions: Discussed LDL goal Maintain cholesterol medication regimen - expect triglycerides will improved with improved BG  Diabetes A1c improved but not at  goal yet;  A1c goal <7.0 Checks BG: Daily Recent FBG Readings: 130-140's (previously 150-160's) Denie s/sx of hypoglycemia.  Current regimen:  Metformin 500mg  twice a day with food Ozempic 0.5mg  injected once per week (Increased from 0.25mg  2 weeks ago) Previous medications tried: metformin 1000mg  bid - GI side effects; Victoza daily - stopped after weight loss and improvement in A1c. [employer] patient assistance program for [employer] 01/16/2022 Interventions: Discussed A1c goals Continue Ozempic 0.5mg  SQ weekly.  [employer] patient assistance program to check on application sent in January. Representative states that online record for medicare monthly payment / income was not accepted, must send in copy of yearly letter stating patients monthly social security benefit amount. Discussed with patient today and he will get needed income verification bring back to the office. Patient was provided with 1 month sample of Ozempic until we can resubmit application with needed income documents.  Reviewed home blood glucose readings and reviewed goals  Fasting blood glucose goal (before meals) = 80 to 130 Blood glucose goal after a meal = less than 180    History of Pulmonary embolism (blood clot)  Controlled; Goal: prevent recurrence of blood clots and prevent stroke Current regimen:  Xarelto 20mg  daily with supper Interventions: (addressed at previous visit)  Screened for patient assistance program (unfortunately patient did not qualify for 2022)  Continue to take Xarelto daily   Asthma: Controlled; Goal: improve breathing and prevent hospitalizations related to asthma exacerbations Current regimen:  Symbicort 80/4.72mcg - inhaler 2 puffs  into lungs once a day (during allergy season) Albuterol inhaler or nebulization solution - use as needed for shortness of breath or wheezing Patient has failed these meds in past: Trelegy  Using maintenance inhaler regularly? Yes during allergy season (Spring and Fall)  Frequency of rescue inhaler use: rarely uses currently but during allergy season 1 or 2 time per week Managed by pulmonology - Dr. Melvyn Novas Interventions: Discussed maintenance versus rescue therapy.  Discussed seasonal triggers  Continue to follow up with Dr Melvyn Novas  Continue above medication regimen.   Medication management Pharmacist Clinical Goal(s): Over the next 90 days, patient will work with PharmD and providers to maintain optimal medication adherence Current pharmacy: [facility] Interventions Comprehensive medication review performed. Continue current medication management strategy Focus on medication adherence by filling and taking medications appropriately  Take medications as prescribed Report any questions or concerns to PharmD and/or provider(s)  Patient Goals/Self-Care Activities Over the next 90 days, patient will:  take medications as prescribed,  check glucose daily, document, and provide at future appointments,  collaborate with provider on medication access solutions. Bring in copy of social security benefits statement for 2023.   Follow Up Plan: Telephone follow up appointment with care management team member scheduled for:  2 to 3 weeks to check on patient assistance program application      Medication Assistance:  Working on [employer] patient assistance program for [employer]. Need updated income documentation. Patient aware and will bring into office in the next week.      Patient's preferred pharmacy is:  [place], [place] - [address] [place] [address] Phone: [phone] Fax: [phone]  [facility]  ([place], [place] [facility] [place] [address] [place] [address] Phone: [phone] Fax: [phone]  [facility] ([facility] ) - [place], [place]  [address] [place] [address] Phone: [phone] Fax: [phone]   Uses pill box? Yes Pt endorses 99% compliance  Follow Up:  Patient agrees to Care Plan and Follow-up.  Plan: Telephone follow up appointment with care management team member scheduled for:  2 to 3 weeks  Cherre Robins, PharmD Clinical Pharmacist [facility]

## 2022-02-25 NOTE — Patient Instructions (Signed)
Understanding Your Risk for Falls ?Each year, millions of people have serious injuries from falls. It is important to understand your risk for falling. Talk with your health care provider about your risk and what you can do to lower it. There are actions you can take at home to lower your risk and prevent falls. ?If you do have a serious fall, make sure to tell your health care provider. Falling once raises your risk of falling again. ?How can falls affect me? ?Serious injuries from falls are common. These include: ?Broken bones, such as hip fractures. ?Head injuries, such as traumatic brain injuries (TBI) or concussion. ?A fear of falling can cause you to avoid activities and stay at home. This can make your muscles weaker and actually raise your risk for a fall. ?What can increase my risk? ?There are a number of risk factors that increase your risk for falling. The more risk factors you have, the higher your risk of falling. Serious injuries from a fall happen most often to people older than age 26. Children and young adults ages 74-29 are also at higher risk. ?Common risk factors include: ?Weakness in the lower body. ?Lack (deficiency) of vitamin D. ?Being generally weak or confused due to long-term (chronic) illness. ?Dizziness or balance problems. ?Poor vision. ?Medicines that cause dizziness or drowsiness. These can include medicines for your blood pressure, heart, anxiety, insomnia, or edema, as well as pain medicines and muscle relaxants. ?Other risk factors include: ?Drinking alcohol. ?Having had a fall in the past. ?Having depression. ?Having foot pain or wearing improper footwear. ?Working at a dangerous job. ?Having any of the following in your home: ?Tripping hazards, such as floor clutter or loose rugs. ?Poor lighting. ?Pets. ?Having dementia or memory loss. ?What actions can I take to lower my risk of falling? ?  ?Physical activity ?Maintain physical fitness. Do strength and balance exercises.  Consider taking a regular class to build strength and balance. Yoga and tai chi are good options. ?Vision ?Have your eyes checked every year and your vision prescription updated as needed. ?Walking aids and footwear ?Wear nonskid shoes. Do not wear high heels. ?Do not walk around the house in socks or slippers. ?Use a cane or walker as told by your health care provider. ?Home safety ?Attach secure railings on both sides of your stairs. ?Install grab bars for your tub, shower, and toilet. Use a bath mat in your tub or shower. ?Use good lighting in all rooms. Keep a flashlight near your bed. ?Make sure there is a clear path from your bed to the bathroom. Use night-lights. ?Do not use throw rugs. Make sure all carpeting is taped or tacked down securely. ?Remove all clutter from walkways and stairways, including extension cords. ?Repair uneven or broken steps. ?Avoid walking on icy or slippery surfaces. Walk on the grass instead of on icy or slick sidewalks. Use ice melt to get rid of ice on walkways. ?Use a cordless phone. ?Questions to ask your health care provider ?Can you help me check my risk for a fall? ?Do any of my medicines make me more likely to fall? ?Should I take a vitamin D supplement? ?What exercises can I do to improve my strength and balance? ?Should I make an appointment to have my vision checked? ?Do I need a bone density test to check for weak bones or osteoporosis? ?Would it help to use a cane or a walker? ?Where to find more information ?Centers for Disease Control and Prevention, STEADI: http://www.wolf.info/ ?  Community-Based Fall Prevention Programs: http://www.wolf.info/ ?[employer] on Aging: http://kim-miller.com/ ?Contact a health care provider if: ?You fall at home. ?You are afraid of falling at home. ?You feel weak, drowsy, or dizzy. ?Summary ?Serious injuries from a fall happen most often to people older than age 47. Children and young adults ages 42-29 are also at higher risk. ?Talk with your health care  provider about your risks for falling and how to lower those risks. ?Taking certain precautions at home can lower your risk for falling. ?If you fall, always tell your health care provider. ?This information is not intended to replace advice given to you by your health care provider. Make sure you discuss any questions you have with your health care provider. ?Document Revised: 07/12/2020 Document Reviewed: 07/12/2020 ?Elsevier Patient Education ? [employer]. ? ?

## 2022-02-25 NOTE — Assessment & Plan Note (Signed)
pmd paperwork filled out  ?

## 2022-02-27 ENCOUNTER — Encounter: Payer: Self-pay | Admitting: Neurology

## 2022-02-27 ENCOUNTER — Ambulatory Visit: Payer: Medicare Other | Admitting: Neurology

## 2022-02-27 ENCOUNTER — Other Ambulatory Visit: Payer: Self-pay

## 2022-02-27 VITALS — BP 161/80 | HR 69 | Ht 69.0 in | Wt 211.8 lb

## 2022-02-27 DIAGNOSIS — G629 Polyneuropathy, unspecified: Secondary | ICD-10-CM | POA: Diagnosis not present

## 2022-02-27 DIAGNOSIS — M21371 Foot drop, right foot: Secondary | ICD-10-CM

## 2022-02-27 DIAGNOSIS — G43109 Migraine with aura, not intractable, without status migrainosus: Secondary | ICD-10-CM

## 2022-02-27 DIAGNOSIS — M5416 Radiculopathy, lumbar region: Secondary | ICD-10-CM
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
## 2022-02-27 NOTE — Patient Instructions (Signed)
Good to see you. ? ?Schedule EMG/NCV of the right arm and leg ? ?2. Schedule MRI lumbar spine without contrast ? ?3. Continue Topamax and Imitrex for migraines ? ?4. Follow-up in 3 months, call for any changes ?

## 2022-02-28 ENCOUNTER — Other Ambulatory Visit: Payer: Self-pay | Admitting: Gastroenterology

## 2022-02-28 ENCOUNTER — Other Ambulatory Visit: Payer: Self-pay | Admitting: Family Medicine

## 2022-02-28 DIAGNOSIS — M1000 Idiopathic gout, unspecified site: Secondary | ICD-10-CM

## 2022-03-01 ENCOUNTER — Telehealth: Payer: Self-pay | Admitting: Gastroenterology

## 2022-03-01 NOTE — Telephone Encounter (Signed)
Unable to reach wife at the listed number. Called and spoke with patient who did not know why wife had called in. He said he would relay the message to her that we had tried to call.  ?

## 2022-03-01 NOTE — Telephone Encounter (Signed)
Patti, ?Judi contacted me the other night about Mike's health issue regarding EPI. Can you please call her and discuss with her?   ?Thanks. ?

## 2022-03-04 DIAGNOSIS — R918 Other nonspecific abnormal finding of lung field: Secondary | ICD-10-CM | POA: Diagnosis not present

## 2022-03-04 DIAGNOSIS — J45991 Cough variant asthma: Secondary | ICD-10-CM | POA: Diagnosis not present

## 2022-03-04 DIAGNOSIS — J986 Disorders of diaphragm: Secondary | ICD-10-CM | POA: Diagnosis not present

## 2022-03-04 DIAGNOSIS — J449 Chronic obstructive pulmonary disease, unspecified: Secondary | ICD-10-CM | POA: Diagnosis not present

## 2022-03-04 DIAGNOSIS — I2782 Chronic pulmonary embolism: Secondary | ICD-10-CM | POA: Diagnosis not present

## 2022-03-04 DIAGNOSIS — J984 Other disorders of lung: Secondary | ICD-10-CM | POA: Diagnosis not present

## 2022-03-04 DIAGNOSIS — Z87891 Personal history of nicotine dependence: Secondary | ICD-10-CM | POA: Diagnosis not present

## 2022-03-12 ENCOUNTER — Telehealth: Payer: Self-pay | Admitting: Family Medicine

## 2022-03-12 NOTE — Telephone Encounter (Signed)
Patient dropped off Ozempic forms for Garrett [name]. Placed on Garrett [name]'s box. ?

## 2022-03-13 ENCOUNTER — Ambulatory Visit: Payer: Medicare Other | Admitting: Podiatry

## 2022-03-15 NOTE — Telephone Encounter (Signed)
Updated financial information faxed to patient assistance program for Ozempic.  ?

## 2022-03-18 ENCOUNTER — Other Ambulatory Visit: Payer: Self-pay

## 2022-03-18 ENCOUNTER — Ambulatory Visit
Admission: RE | Admit: 2022-03-18 | Discharge: 2022-03-18 | Disposition: A | Discharge status: Home / Self Care | Payer: Medicare Other | Source: Ambulatory Visit | Attending: Neurology | Admitting: Neurology

## 2022-03-18 DIAGNOSIS — M21371 Foot drop, right foot: Secondary | ICD-10-CM

## 2022-03-18 DIAGNOSIS — G43109 Migraine with aura, not intractable, without status migrainosus: Secondary | ICD-10-CM

## 2022-03-18 DIAGNOSIS — M5416 Radiculopathy, lumbar region: Secondary | ICD-10-CM

## 2022-03-18 DIAGNOSIS — G629 Polyneuropathy, unspecified: Secondary | ICD-10-CM

## 2022-03-21 ENCOUNTER — Other Ambulatory Visit: Payer: Self-pay | Admitting: Family Medicine

## 2022-03-22 DIAGNOSIS — E1169 Type 2 diabetes mellitus with other specified complication: Secondary | ICD-10-CM

## 2022-03-22 DIAGNOSIS — E785 Hyperlipidemia, unspecified: Secondary | ICD-10-CM | POA: Diagnosis not present

## 2022-03-22 DIAGNOSIS — E1121 Type 2 diabetes mellitus with diabetic nephropathy: Secondary | ICD-10-CM | POA: Diagnosis not present

## 2022-03-25 ENCOUNTER — Encounter: Payer: Self-pay | Admitting: Podiatry

## 2022-03-25 ENCOUNTER — Ambulatory Visit (INDEPENDENT_AMBULATORY_CARE_PROVIDER_SITE_OTHER): Payer: Medicare Other | Admitting: Podiatry

## 2022-03-25 DIAGNOSIS — M79675 Pain in left toe(s): Secondary | ICD-10-CM | POA: Diagnosis not present

## 2022-03-25 DIAGNOSIS — M79674 Pain in right toe(s): Secondary | ICD-10-CM | POA: Diagnosis not present

## 2022-03-25 DIAGNOSIS — B351 Tinea unguium: Secondary | ICD-10-CM

## 2022-03-25 DIAGNOSIS — I872 Venous insufficiency (chronic) (peripheral): Secondary | ICD-10-CM

## 2022-03-25 DIAGNOSIS — E1121 Type 2 diabetes mellitus with diabetic nephropathy: Secondary | ICD-10-CM | POA: Diagnosis not present

## 2022-03-25 NOTE — Progress Notes (Signed)
This patient returns to my office for at risk foot care.  This patient requires this care by a professional since this patient will be at risk due to having diabetes and coagulation defect. This patient is unable to cut nails himself since the patient cannot reach his nails.These nails are painful walking and wearing shoes.  This patient presents for at risk foot care today.  General Appearance  Alert, conversant and in no acute stress.  Vascular  Dorsalis pedis and posterior tibial  pulses are palpable  bilaterally.  Capillary return is within normal limits  bilaterally. Temperature is within normal limits  bilaterally.  Neurologic  Senn-Weinstein monofilament wire test diminished/absent bilaterally. Muscle power within normal limits bilaterally.  Nails Thick disfigured discolored nails with subungual debris  from hallux to fifth toes bilaterally. No evidence of bacterial infection or drainage bilaterally.  Orthopedic  No limitations of motion  feet .  No crepitus or effusions noted.  No bony pathology or digital deformities noted.  Midfoot arthritis  B/L.  Skin  normotropic skin with no porokeratosis noted bilaterally.  No signs of infections or ulcers noted.     Onychomycosis  Pain in right toes  Pain in left toes  Consent was obtained for treatment procedures.   Mechanical debridement of nails 1-5  bilaterally performed with a nail nipper.  Filed with dremel without incident.    Return office visit    4  months                 Told patient to return for periodic foot care and evaluation due to potential at risk complications.   Tristian Sickinger DPM   

## 2022-03-26 DIAGNOSIS — H903 Sensorineural hearing loss, bilateral: Secondary | ICD-10-CM | POA: Diagnosis not present

## 2022-03-26 DIAGNOSIS — Z45321 Encounter for adjustment and management of cochlear device: Secondary | ICD-10-CM | POA: Diagnosis not present

## 2022-03-28 ENCOUNTER — Telehealth: Payer: Self-pay

## 2022-03-28 ENCOUNTER — Ambulatory Visit: Payer: Medicare Other | Admitting: Neurology

## 2022-03-28 DIAGNOSIS — G629 Polyneuropathy, unspecified: Secondary | ICD-10-CM | POA: Diagnosis not present

## 2022-03-28 DIAGNOSIS — G43109 Migraine with aura, not intractable, without status migrainosus: Secondary | ICD-10-CM

## 2022-03-28 DIAGNOSIS — M21371 Foot drop, right foot: Secondary | ICD-10-CM

## 2022-03-28 DIAGNOSIS — M5416 Radiculopathy, lumbar region: Secondary | ICD-10-CM

## 2022-03-28 NOTE — Telephone Encounter (Signed)
Pt called an informed  EMG shows progressive neuropathy involving the hands, in addition to the legs, which would explain his weakness.    ?

## 2022-03-28 NOTE — Procedures (Signed)
[facility]  ?[address] ? [place], [place] [address] ?Tel: [phone] ?Fax:  [phone] ?Test Date:  03/28/2022 ? ?Patient: Garrett [name] DOB: [birth year] Physician: Narda Amber, DO  ?Sex: Male Height: '5\' 9"'$  Ref Phys: Ellouise Newer, M.D.  ?ID#: [id number]   Technician:   ? ?Patient Complaints: ?This is a 71 year old man with neuropathy referred for evaluation of generalized weakness. ? ?NCV & EMG Findings: ?Extensive electrodiagnostic testing of the right upper and lower extremity shows:  ?All sensory responses including the median, ulnar, radial, sural, and superficial peroneal nerves are absent. ?Right median motor response shows prolonged latency (4.1 ms) and decreased conduction velocity (Elbow-Wrist, 43 m/s).  Right ulnar motor responses within normal limits.  Right peroneal motor response at the extensor digitorum brevis is absent, and reduced at the tibialis anterior (1.1 mV).  Right tibial motor response is absent. ?Right tibial H reflex study is absent. ?In the right upper extremity, chronic motor axonal loss changes are seen affecting the distal hand muscles including the first dorsal interosseous, extensor indicis proprius, and abductor pollicis brevis muscles. ?In the right lower extremity, chronic motor axonal loss changes are seen in the anterior tibialis, gastrocnemius, and rectus femoris muscles.  Proximal and deep muscles were not tested as the patient is on anticoagulation therapy. ? ?Impression: ?The electrophysiologic findings are consistent with a chronic sensorimotor predominantly axonal neuropathy affecting her right upper and lower extremities, which has progressed compared to prior study on July 29, 2019. ? ? ?___________________________ ?Narda Amber, DO ? ? ? ?Nerve Conduction Studies ?Anti Sensory Summary Table ? ? Stim Site NR Peak (ms) Norm Peak (ms) P-T Amp (?V) Norm P-T Amp  ?Right Median Anti Sensory (2nd Digit)  33?C  ?Wrist NR  <3.8  >10  ?Right Radial  Anti Sensory (Base 1st Digit)  33?C  ?Wrist NR  <2.8  >10  ?Right Sup Peroneal Anti Sensory (Ant Lat Mall)  33?C  ?12 cm NR  <4.6  >3  ?Right Sural Anti Sensory (Lat Mall)  33?C  ?Calf NR  <4.6  >3  ?Right Ulnar Anti Sensory (5th Digit)  33?C  ?Wrist NR  <3.2  >5  ? ?Motor Summary Table ? ? Stim Site NR Onset (ms) Norm Onset (ms) O-P Amp (mV) Norm O-P Amp Site1 Site2 Delta-0 (ms) Dist (cm) Vel (m/s) Norm Vel (m/s)  ?Right Median Motor (Abd Poll Brev)  33?C  ?Wrist    4.1 <4.0 6.3 >5 Elbow Wrist 6.8 29.0 43 >50  ?Elbow    10.9  5.5         ?Right Peroneal Motor (Ext Dig Brev)  33?C  ?Ankle NR  <6.0  >2.5 B Fib Ankle  0.0  >40  ?B Fib NR     Poplt B Fib  0.0  >40  ?Poplt NR            ?Right Peroneal TA Motor (Tib Ant)  33?C  ?Fib Head    2.7 <4.5 1.1 >3 Poplit Fib Head 1.5 8.0 53 >40  ?Poplit    4.2  0.9         ?Right Tibial Motor (Abd [place] Crane Brev)  33?C  ?Ankle NR  <6.0  >4 Knee Ankle  0.0  >40  ?Knee NR            ?Right Ulnar Motor (Abd Dig Minimi)  33?C  ?Wrist    2.3 <3.1 8.2 >7 B Elbow Wrist 4.3 22.0 51 >50  ?B Elbow  6.6  7.6  A Elbow B Elbow 1.9 10.0 53 >50  ?A Elbow    8.5  7.5         ? ?H Reflex Studies ? ? NR H-Lat (ms) Lat Norm (ms) L-R H-Lat (ms)  ?Right Tibial (Gastroc)  33?C  ?NR  <35   ? ?EMG ? ? Side Muscle Ins Act Fibs Psw Fasc Number Recrt Dur Dur. Amp Amp. Poly Poly. Comment  ?Right 1stDorInt Nml Nml Nml Nml 2- Rapid Most 1+ Most 1+ Most 1+ N/A  ?Right Abd Poll Brev Nml Nml Nml Nml 2- Rapid Many 1+ Many 1+ Many 1+ N/A  ?Right Ext Indicis Nml Nml Nml Nml 2- Rapid Many 1+ Many 1+ Many 1+ N/A  ?Right AntTibialis Nml Nml Nml Nml SMU Rapid All 1+ All 1+ All 1+ ATR  ?Right Gastroc Nml Nml Nml Nml SMU Rapid All 1+ All 1+ All 1+ ATR  ?Right RectFemoris Nml Nml Nml Nml 3- Rapid All 1+ All 1+ All 1+ ATR  ?Right Biceps Nml Nml Nml Nml Nml Nml Nml Nml Nml Nml Nml Nml N/A  ?Right Deltoid Nml Nml Nml Nml Nml Nml Nml Nml Nml Nml Nml Nml N/A  ?Right Triceps Nml Nml Nml Nml Nml Nml Nml Nml Nml Nml Nml Nml N/A   ? ? ? ? ?Waveforms: ?    ? ?    ? ?    ? ?   ? ? ?

## 2022-03-28 NOTE — Telephone Encounter (Signed)
-----   Message from Alda Berthold, DO sent at 03/28/2022  3:46 PM EDT ----- ?Please let pt know that his EMG shows progressive neuropathy involving the hands, in addition to the legs, which would explain his weakness.   ?

## 2022-04-15 DIAGNOSIS — J449 Chronic obstructive pulmonary disease, unspecified: Secondary | ICD-10-CM | POA: Diagnosis not present

## 2022-04-15 DIAGNOSIS — R918 Other nonspecific abnormal finding of lung field: Secondary | ICD-10-CM | POA: Diagnosis not present

## 2022-04-15 DIAGNOSIS — Z87891 Personal history of nicotine dependence: Secondary | ICD-10-CM | POA: Diagnosis not present

## 2022-04-15 DIAGNOSIS — I2782 Chronic pulmonary embolism: Secondary | ICD-10-CM | POA: Diagnosis not present

## 2022-04-17 DIAGNOSIS — M1990 Unspecified osteoarthritis, unspecified site: Secondary | ICD-10-CM | POA: Diagnosis not present

## 2022-04-17 DIAGNOSIS — I509 Heart failure, unspecified: Secondary | ICD-10-CM | POA: Diagnosis not present

## 2022-04-19 ENCOUNTER — Encounter: Payer: Self-pay | Admitting: Family Medicine

## 2022-04-19 ENCOUNTER — Ambulatory Visit (INDEPENDENT_AMBULATORY_CARE_PROVIDER_SITE_OTHER): Payer: Medicare Other | Admitting: Family Medicine

## 2022-04-19 ENCOUNTER — Telehealth: Payer: Self-pay

## 2022-04-19 VITALS — BP 120/74 | HR 74 | Temp 98.1°F | Resp 20 | Ht 69.0 in | Wt 213.8 lb

## 2022-04-19 DIAGNOSIS — N390 Urinary tract infection, site not specified: Secondary | ICD-10-CM | POA: Diagnosis not present

## 2022-04-19 DIAGNOSIS — I2699 Other pulmonary embolism without acute cor pulmonale: Secondary | ICD-10-CM | POA: Diagnosis not present

## 2022-04-19 DIAGNOSIS — R319 Hematuria, unspecified: Secondary | ICD-10-CM | POA: Diagnosis not present

## 2022-04-19 DIAGNOSIS — I5032 Chronic diastolic (congestive) heart failure: Secondary | ICD-10-CM | POA: Diagnosis not present

## 2022-04-19 DIAGNOSIS — I2510 Atherosclerotic heart disease of native coronary artery without angina pectoris: Secondary | ICD-10-CM | POA: Diagnosis not present

## 2022-04-19 LAB — CBC WITH DIFFERENTIAL/PLATELET
Absolute Monocytes: 502 cells/uL (ref 200–950)
Basophils Absolute: 33 cells/uL (ref 0–200)
Basophils Relative: 0.5 %
Eosinophils Absolute: 119 cells/uL (ref 15–500)
Eosinophils Relative: 1.8 %
HCT: 37.5 % — ABNORMAL LOW (ref 38.5–50.0)
Hemoglobin: 12.4 g/dL — ABNORMAL LOW (ref 13.2–17.1)
Lymphs Abs: 1709 cells/uL (ref 850–3900)
MCH: 30 pg (ref 27.0–33.0)
MCHC: 33.1 g/dL (ref 32.0–36.0)
MCV: 90.6 fL (ref 80.0–100.0)
MPV: 10.1 fL (ref 7.5–12.5)
Monocytes Relative: 7.6 %
Neutro Abs: 4237 cells/uL (ref 1500–7800)
Neutrophils Relative %: 64.2 %
Platelets: 286 10*3/uL (ref 140–400)
RBC: 4.14 10*6/uL — ABNORMAL LOW (ref 4.20–5.80)
RDW: 13.9 % (ref 11.0–15.0)
Total Lymphocyte: 25.9 %
WBC: 6.6 10*3/uL (ref 3.8–10.8)

## 2022-04-19 LAB — POC URINALSYSI DIPSTICK (AUTOMATED)
Glucose, UA: NEGATIVE
Ketones, UA: NEGATIVE
Nitrite, UA: POSITIVE
Protein, UA: POSITIVE — AB
Spec Grav, UA: 1.02 (ref 1.010–1.025)
Urobilinogen, UA: 0.2 E.U./dL
pH, UA: 6 (ref 5.0–8.0)

## 2022-04-19 MED ORDER — CIPROFLOXACIN HCL 500 MG PO TABS: 500.0000 mg | ORAL_TABLET | Freq: Two times a day (BID) | ORAL | 0 refills | Status: AC

## 2022-04-19 NOTE — Assessment & Plan Note (Signed)
On xaralto  ?Will not hold it due to uti ?If pain worsens -- go to ER  ?Back pain is mild ==== not severe so doubt kidney stone or retroperitoneal bleed  ?F/u next week ?

## 2022-04-19 NOTE — Patient Instructions (Signed)
Urinary Tract Infection, Adult  A urinary tract infection (UTI) is an infection of any part of the urinary tract. The urinary tract includes the kidneys, ureters, bladder, and urethra. These organs make, store, and get rid of urine in the body. An upper UTI affects the ureters and kidneys. A lower UTI affects the bladder and urethra. What are the causes? Most urinary tract infections are caused by bacteria in your genital area around your urethra, where urine leaves your body. These bacteria grow and cause inflammation of your urinary tract. What increases the risk? You are more likely to develop this condition if: You have a urinary catheter that stays in place. You are not able to control when you urinate or have a bowel movement (incontinence). You are male and you: Use a spermicide or diaphragm for birth control. Have low estrogen levels. Are pregnant. You have certain genes that increase your risk. You are sexually active. You take antibiotic medicines. You have a condition that causes your flow of urine to slow down, such as: An enlarged prostate, if you are male. Blockage in your urethra. A kidney stone. A nerve condition that affects your bladder control (neurogenic bladder). Not getting enough to drink, or not urinating often. You have certain medical conditions, such as: Diabetes. A weak disease-fighting system (immunesystem). Sickle cell disease. Gout. Spinal cord injury. What are the signs or symptoms? Symptoms of this condition include: Needing to urinate right away (urgency). Frequent urination. This may include small amounts of urine each time you urinate. Pain or burning with urination. Blood in the urine. Urine that smells bad or unusual. Trouble urinating. Cloudy urine. Vaginal discharge, if you are male. Pain in the abdomen or the lower back. You may also have: Vomiting or a decreased appetite. Confusion. Irritability or tiredness. A fever or  chills. Diarrhea. The first symptom in older adults may be confusion. In some cases, they may not have any symptoms until the infection has worsened. How is this diagnosed? This condition is diagnosed based on your medical history and a physical exam. You may also have other tests, including: Urine tests. Blood tests. Tests for STIs (sexually transmitted infections). If you have had more than one UTI, a cystoscopy or imaging studies may be done to determine the cause of the infections. How is this treated? Treatment for this condition includes: Antibiotic medicine. Over-the-counter medicines to treat discomfort. Drinking enough water to stay hydrated. If you have frequent infections or have other conditions such as a kidney stone, you may need to see a health care provider who specializes in the urinary tract (urologist). In rare cases, urinary tract infections can cause sepsis. Sepsis is a life-threatening condition that occurs when the body responds to an infection. Sepsis is treated in the hospital with IV antibiotics, fluids, and other medicines. Follow these instructions at home:  Medicines Take over-the-counter and prescription medicines only as told by your health care provider. If you were prescribed an antibiotic medicine, take it as told by your health care provider. Do not stop using the antibiotic even if you start to feel better. General instructions Make sure you: Empty your bladder often and completely. Do not hold urine for long periods of time. Empty your bladder after sex. Wipe from front to back after urinating or having a bowel movement if you are male. Use each tissue only one time when you wipe. Drink enough fluid to keep your urine pale yellow. Keep all follow-up visits. This is important. Contact a health   care provider if: Your symptoms do not get better after 1-2 days. Your symptoms go away and then return. Get help right away if: You have severe pain in  your back or your lower abdomen. You have a fever or chills. You have nausea or vomiting. Summary A urinary tract infection (UTI) is an infection of any part of the urinary tract, which includes the kidneys, ureters, bladder, and urethra. Most urinary tract infections are caused by bacteria in your genital area. Treatment for this condition often includes antibiotic medicines. If you were prescribed an antibiotic medicine, take it as told by your health care provider. Do not stop using the antibiotic even if you start to feel better. Keep all follow-up visits. This is important. This information is not intended to replace advice given to you by your health care provider. Make sure you discuss any questions you have with your health care provider. Document Revised: 07/21/2020 Document Reviewed: 07/21/2020 Elsevier Patient Education  2023 Elsevier Inc.  

## 2022-04-19 NOTE — Telephone Encounter (Signed)
Triage nurse called and stated pt is urinating frequently and urine is very dark. Also states pt has lower back pain. Patient scheduled for same day appointment at 3:40 with Dr. Etter Sjogren. ?

## 2022-04-19 NOTE — Telephone Encounter (Signed)
Nurse Assessment ?Nurse: Zorita Pang, RN, Deborah Date/Time (Eastern Time): 04/19/2022 1:38:50 PM ?Confirm and document reason for call. If ?symptomatic, describe symptoms. ?---The caller states that he has reddish or coffee ?colored coffee. Drinks about a gallon of liquids ?everyday. Has not eaten beets or rhubarb. No pain ?anywhere and no fever. Is on xarelto ?Does the patient have any new or worsening ?symptoms? ---Yes ?Will a triage be completed? ---Yes ?Related visit to physician within the last 2 weeks? ---Yes ?Does the PT have any chronic conditions? (i.e. ?diabetes, asthma, this includes High risk factors for ?pregnancy, etc.) ?---Yes ?List chronic conditions. ---hypertension, diabetes, takes Lasix for respiratory, ?metropolol Takes xarelto for growths in his lungs ?Is this a behavioral health or substance abuse call? ---No ?Guidelines ?Guideline Title Affirmed Question Affirmed Notes Nurse Date/Time (Eastern ?Time) ?Urinary Symptoms Side (flank) or lower ?back pain present ?Womble, RN, ?Neoma Laming ?04/19/2022 1:43:46 ?PM ?Disp. Time (Eastern ?Time) Disposition Final User ?04/19/2022 1:51:23 PM See PCP within 24 Hours Yes Womble, RN, Neoma Laming ?PLEASE NOTE: All timestamps contained within this report are represented as [place] Standard Time. ?CONFIDENTIALTY NOTICE: This fax transmission is intended only for the addressee. It contains information that is legally privileged, confidential or ?otherwise protected from use or disclosure. If you are not the intended recipient, you are strictly prohibited from reviewing, disclosing, copying using ?or disseminating any of this information or taking any action in reliance on or regarding this information. If you have received this fax in error, please ?notify us immediately by telephone so that we can arrange for its return to [place]. Phone: [phone], Toll-Free: [phone], Fax: [phone] ?Page: 2 of 2 ?Call Id: [id number] ?Caller Disagree/Comply Comply ?Caller Understands  Yes ?PreDisposition Call Doctor ?Care Advice Given Per Guideline ?SEE PCP WITHIN 24 HOURS: CALL BACK IF: * You become worse ?Comments ?User: Marquis Buggy, RN Date/Time [name] Time): 04/19/2022 1:50:13 PM ?Caller takes a number of medications - nememba, proventil, symbicort, gabapentin, lipitor, xyloprine, prilosec, ?metformin, celebrex in addiiton to what was already documented. ?User: Marquis Buggy, RN Date/Time [name] Time): 04/19/2022 1:52:49 PM ?Called the backline at the office and got the caller scheduled for 3:40 this afternoon. He was given a choice of 3:40 ?or 4 pm and he took the 3:40 time. He had no further questions. Office staff member was very helpful. ?Referrals ?REFERRED TO PCP OFFICE ?
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
## 2022-04-20 LAB — URINE CULTURE
MICRO NUMBER:: 13326560
SPECIMEN QUALITY:: ADEQUATE

## 2022-05-03 ENCOUNTER — Encounter: Payer: Self-pay | Admitting: Family Medicine

## 2022-05-03 ENCOUNTER — Ambulatory Visit (INDEPENDENT_AMBULATORY_CARE_PROVIDER_SITE_OTHER): Payer: Medicare Other | Admitting: Family Medicine

## 2022-05-03 ENCOUNTER — Telehealth: Payer: Self-pay | Admitting: Pharmacist

## 2022-05-03 VITALS — BP 118/82 | HR 84 | Temp 98.5°F | Resp 20 | Ht 69.0 in | Wt 210.6 lb

## 2022-05-03 DIAGNOSIS — N390 Urinary tract infection, site not specified: Secondary | ICD-10-CM | POA: Diagnosis not present

## 2022-05-03 DIAGNOSIS — R319 Hematuria, unspecified: Secondary | ICD-10-CM | POA: Diagnosis not present

## 2022-05-03 LAB — POC URINALSYSI DIPSTICK (AUTOMATED)
Bilirubin, UA: NEGATIVE
Blood, UA: NEGATIVE
Glucose, UA: NEGATIVE
Ketones, UA: NEGATIVE
Leukocytes, UA: NEGATIVE
Nitrite, UA: NEGATIVE
Protein, UA: NEGATIVE
Spec Grav, UA: 1.015 (ref 1.010–1.025)
Urobilinogen, UA: 0.2 E.U./dL
pH, UA: 6 (ref 5.0–8.0)

## 2022-05-03 NOTE — Assessment & Plan Note (Signed)
uti and blood ---  Resolved  ?rto prn ?
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
## 2022-05-03 NOTE — Patient Instructions (Signed)
Urinary Tract Infection, Adult  A urinary tract infection (UTI) is an infection of any part of the urinary tract. The urinary tract includes the kidneys, ureters, bladder, and urethra. These organs make, store, and get rid of urine in the body. An upper UTI affects the ureters and kidneys. A lower UTI affects the bladder and urethra. What are the causes? Most urinary tract infections are caused by bacteria in your genital area around your urethra, where urine leaves your body. These bacteria grow and cause inflammation of your urinary tract. What increases the risk? You are more likely to develop this condition if: You have a urinary catheter that stays in place. You are not able to control when you urinate or have a bowel movement (incontinence). You are male and you: Use a spermicide or diaphragm for birth control. Have low estrogen levels. Are pregnant. You have certain genes that increase your risk. You are sexually active. You take antibiotic medicines. You have a condition that causes your flow of urine to slow down, such as: An enlarged prostate, if you are male. Blockage in your urethra. A kidney stone. A nerve condition that affects your bladder control (neurogenic bladder). Not getting enough to drink, or not urinating often. You have certain medical conditions, such as: Diabetes. A weak disease-fighting system (immunesystem). Sickle cell disease. Gout. Spinal cord injury. What are the signs or symptoms? Symptoms of this condition include: Needing to urinate right away (urgency). Frequent urination. This may include small amounts of urine each time you urinate. Pain or burning with urination. Blood in the urine. Urine that smells bad or unusual. Trouble urinating. Cloudy urine. Vaginal discharge, if you are male. Pain in the abdomen or the lower back. You may also have: Vomiting or a decreased appetite. Confusion. Irritability or tiredness. A fever or  chills. Diarrhea. The first symptom in older adults may be confusion. In some cases, they may not have any symptoms until the infection has worsened. How is this diagnosed? This condition is diagnosed based on your medical history and a physical exam. You may also have other tests, including: Urine tests. Blood tests. Tests for STIs (sexually transmitted infections). If you have had more than one UTI, a cystoscopy or imaging studies may be done to determine the cause of the infections. How is this treated? Treatment for this condition includes: Antibiotic medicine. Over-the-counter medicines to treat discomfort. Drinking enough water to stay hydrated. If you have frequent infections or have other conditions such as a kidney stone, you may need to see a health care provider who specializes in the urinary tract (urologist). In rare cases, urinary tract infections can cause sepsis. Sepsis is a life-threatening condition that occurs when the body responds to an infection. Sepsis is treated in the hospital with IV antibiotics, fluids, and other medicines. Follow these instructions at home:  Medicines Take over-the-counter and prescription medicines only as told by your health care provider. If you were prescribed an antibiotic medicine, take it as told by your health care provider. Do not stop using the antibiotic even if you start to feel better. General instructions Make sure you: Empty your bladder often and completely. Do not hold urine for long periods of time. Empty your bladder after sex. Wipe from front to back after urinating or having a bowel movement if you are male. Use each tissue only one time when you wipe. Drink enough fluid to keep your urine pale yellow. Keep all follow-up visits. This is important. Contact a health   care provider if: Your symptoms do not get better after 1-2 days. Your symptoms go away and then return. Get help right away if: You have severe pain in  your back or your lower abdomen. You have a fever or chills. You have nausea or vomiting. Summary A urinary tract infection (UTI) is an infection of any part of the urinary tract, which includes the kidneys, ureters, bladder, and urethra. Most urinary tract infections are caused by bacteria in your genital area. Treatment for this condition often includes antibiotic medicines. If you were prescribed an antibiotic medicine, take it as told by your health care provider. Do not stop using the antibiotic even if you start to feel better. Keep all follow-up visits. This is important. This information is not intended to replace advice given to you by your health care provider. Make sure you discuss any questions you have with your health care provider. Document Revised: 07/21/2020 Document Reviewed: 07/21/2020 Elsevier Patient Education  2023 Elsevier Inc.  

## 2022-05-03 NOTE — Telephone Encounter (Signed)
Called to check progress of Ozempic patient assistance program. Faxed needed financial documentation 03/15/2022. Spoke with [employer] and they were unable to find financial information faxed 03/15/2022.  ?Refaxed - received conformation that faxed was received. Will take 7 to 10 business days to process. Patient was provided with 1 month of Ozempic samples by Dr Carollee Herter.  ?Will check back in 10 business day.  ?

## 2022-05-03 NOTE — Assessment & Plan Note (Signed)
abx finished ?ua rechecked today---  Normal  ?rto prn  ?

## 2022-05-07 ENCOUNTER — Other Ambulatory Visit: Payer: Self-pay | Admitting: Family Medicine

## 2022-05-14 ENCOUNTER — Telehealth: Payer: Medicare Other

## 2022-05-16 NOTE — Telephone Encounter (Signed)
Pt called and informed that medication has arrived. Placed in the [place] by [place]. Pt states he will not be back in town til June 15ish.

## 2022-05-17 DIAGNOSIS — I509 Heart failure, unspecified: Secondary | ICD-10-CM | POA: Diagnosis not present

## 2022-05-17 DIAGNOSIS — M1990 Unspecified osteoarthritis, unspecified site: Secondary | ICD-10-CM | POA: Diagnosis not present

## 2022-05-18 ENCOUNTER — Other Ambulatory Visit: Payer: Self-pay | Admitting: Family Medicine

## 2022-05-27 ENCOUNTER — Ambulatory Visit: Payer: Medicare Other | Admitting: Family Medicine

## 2022-06-05 ENCOUNTER — Ambulatory Visit: Payer: Medicare Other | Admitting: Neurology

## 2022-06-05 ENCOUNTER — Other Ambulatory Visit: Payer: Self-pay | Admitting: Family Medicine

## 2022-06-05 ENCOUNTER — Encounter: Payer: Self-pay | Admitting: Neurology

## 2022-06-05 VITALS — BP 122/80 | HR 73 | Ht 69.0 in | Wt 204.0 lb

## 2022-06-05 DIAGNOSIS — G43109 Migraine with aura, not intractable, without status migrainosus: Secondary | ICD-10-CM

## 2022-06-05 DIAGNOSIS — M5416 Radiculopathy, lumbar region: Secondary | ICD-10-CM

## 2022-06-05 DIAGNOSIS — G629 Polyneuropathy, unspecified: Secondary | ICD-10-CM
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
## 2022-06-05 NOTE — Patient Instructions (Addendum)
Good to see you.  Let's do bloodwork for neuropathy. TSH, B12, B1, ESR, CRP, SPEP, IFE, folate, ANA, SS-A, SS-B  2. Check with your ENT about if implant is MRI-compatible. If not, we will plan for a lumbar CT instead  3. Continue with controlling glucose levels, regular exercise  4. Follow-up in 6 months, call for any changes

## 2022-06-06 ENCOUNTER — Other Ambulatory Visit: Payer: Self-pay | Admitting: Family Medicine

## 2022-06-07 ENCOUNTER — Other Ambulatory Visit (INDEPENDENT_AMBULATORY_CARE_PROVIDER_SITE_OTHER): Payer: Medicare Other

## 2022-06-07 ENCOUNTER — Encounter: Payer: Self-pay | Admitting: Family Medicine

## 2022-06-07 ENCOUNTER — Ambulatory Visit (INDEPENDENT_AMBULATORY_CARE_PROVIDER_SITE_OTHER): Payer: Medicare Other | Admitting: Family Medicine

## 2022-06-07 VITALS — BP 128/88 | HR 85 | Temp 97.8°F | Resp 18 | Ht 69.0 in | Wt 204.2 lb

## 2022-06-07 DIAGNOSIS — E1122 Type 2 diabetes mellitus with diabetic chronic kidney disease: Secondary | ICD-10-CM | POA: Diagnosis not present

## 2022-06-07 DIAGNOSIS — E785 Hyperlipidemia, unspecified: Secondary | ICD-10-CM

## 2022-06-07 DIAGNOSIS — I10 Essential (primary) hypertension: Secondary | ICD-10-CM

## 2022-06-07 DIAGNOSIS — M5416 Radiculopathy, lumbar region: Secondary | ICD-10-CM | POA: Diagnosis not present

## 2022-06-07 DIAGNOSIS — M48062 Spinal stenosis, lumbar region with neurogenic claudication: Secondary | ICD-10-CM

## 2022-06-07 DIAGNOSIS — G629 Polyneuropathy, unspecified: Secondary | ICD-10-CM | POA: Diagnosis not present

## 2022-06-07 DIAGNOSIS — I2510 Atherosclerotic heart disease of native coronary artery without angina pectoris: Secondary | ICD-10-CM | POA: Diagnosis not present

## 2022-06-07 DIAGNOSIS — E1165 Type 2 diabetes mellitus with hyperglycemia: Secondary | ICD-10-CM

## 2022-06-07 DIAGNOSIS — E1121 Type 2 diabetes mellitus with diabetic nephropathy: Secondary | ICD-10-CM | POA: Diagnosis not present

## 2022-06-07 DIAGNOSIS — E1169 Type 2 diabetes mellitus with other specified complication: Secondary | ICD-10-CM

## 2022-06-07 DIAGNOSIS — I5032 Chronic diastolic (congestive) heart failure: Secondary | ICD-10-CM | POA: Diagnosis not present

## 2022-06-07 DIAGNOSIS — R29898 Other symptoms and signs involving the musculoskeletal system: Secondary | ICD-10-CM

## 2022-06-07 DIAGNOSIS — N182 Chronic kidney disease, stage 2 (mild): Secondary | ICD-10-CM

## 2022-06-07 DIAGNOSIS — I2699 Other pulmonary embolism without acute cor pulmonale: Secondary | ICD-10-CM | POA: Diagnosis not present

## 2022-06-07 DIAGNOSIS — R296 Repeated falls: Secondary | ICD-10-CM

## 2022-06-07 DIAGNOSIS — I129 Hypertensive chronic kidney disease with stage 1 through stage 4 chronic kidney disease, or unspecified chronic kidney disease: Secondary | ICD-10-CM

## 2022-06-07 DIAGNOSIS — D500 Iron deficiency anemia secondary to blood loss (chronic): Secondary | ICD-10-CM

## 2022-06-07 LAB — TSH: TSH: 2.75 u[IU]/mL (ref 0.35–5.50)

## 2022-06-07 LAB — SEDIMENTATION RATE: Sed Rate: 44 mm/hr — ABNORMAL HIGH (ref 0–20)

## 2022-06-07 LAB — C-REACTIVE PROTEIN: CRP: <1 mg/dL (ref 0.5–20.0)

## 2022-06-07 LAB — VITAMIN B12: Vitamin B-12: >1504 pg/mL — ABNORMAL HIGH (ref 211–911)

## 2022-06-07 LAB — FOLATE: Folate: >24.2 ng/mL (ref >5.9)

## 2022-06-07 NOTE — Assessment & Plan Note (Signed)
Pt to f/u neuro and considere pt again

## 2022-06-07 NOTE — Assessment & Plan Note (Signed)
Encourage heart healthy diet such as MIND or DASH diet, increase exercise, avoid trans fats, simple carbohydrates and processed foods, consider a krill or fish or flaxseed oil cap daily.  °

## 2022-06-07 NOTE — Assessment & Plan Note (Signed)
On xaralto 

## 2022-06-07 NOTE — Assessment & Plan Note (Signed)
Pt does not think the neurontin is working He will decrease to 1 a day and see if he notices a difference  F/u ortho

## 2022-06-07 NOTE — Assessment & Plan Note (Signed)
Well controlled, no changes to meds. Encouraged heart healthy diet such as the DASH diet and exercise as tolerated.  °
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
## 2022-06-07 NOTE — Assessment & Plan Note (Signed)
Per cardiology 

## 2022-06-07 NOTE — Patient Instructions (Signed)

## 2022-06-07 NOTE — Assessment & Plan Note (Signed)
F/u neuro Dec gabepentin to 1 a day  Will see if weaning off will help balance He is willing to do Pt again as well

## 2022-06-07 NOTE — Assessment & Plan Note (Signed)
hgba1c to be checked, minimize simple carbs. Increase exercise as tolerated. Continue current meds  

## 2022-06-10 DIAGNOSIS — Z461 Encounter for fitting and adjustment of hearing aid: Secondary | ICD-10-CM | POA: Diagnosis not present

## 2022-06-11 ENCOUNTER — Encounter: Payer: Self-pay | Admitting: Neurology

## 2022-06-13 ENCOUNTER — Other Ambulatory Visit: Payer: Self-pay | Admitting: Neurology

## 2022-06-13 LAB — PROTEIN ELECTROPHORESIS, SERUM
Albumin ELP: 4.3 g/dL (ref 3.8–4.8)
Alpha 1: 0.3 g/dL (ref 0.2–0.3)
Alpha 2: 1.1 g/dL — ABNORMAL HIGH (ref 0.5–0.9)
Beta 2: 0.4 g/dL (ref 0.2–0.5)
Beta Globulin: 0.5 g/dL (ref 0.4–0.6)
Gamma Globulin: 1 g/dL (ref 0.8–1.7)
Total Protein: 7.5 g/dL (ref 6.1–8.1)

## 2022-06-13 LAB — IMMUNOFIXATION ELECTROPHORESIS
IgG (Immunoglobin G), Serum: 926 mg/dL (ref 600–1540)
IgM, Serum: 161 mg/dL (ref 50–300)
Immunofix Electr Int: NOT DETECTED
Immunoglobulin A: 114 mg/dL (ref 70–320)

## 2022-06-13 LAB — VITAMIN B1: Vitamin B1 (Thiamine): 30 nmol/L (ref 8–30)

## 2022-06-13 LAB — SJOGREN'S SYNDROME ANTIBODS(SSA + SSB)
SSA (Ro) (ENA) Antibody, IgG: <1 AI
SSB (La) (ENA) Antibody, IgG: <1 AI

## 2022-06-13 LAB — ANA: Anti Nuclear Antibody (ANA): NEGATIVE

## 2022-06-14 ENCOUNTER — Other Ambulatory Visit: Payer: Medicare Other

## 2022-06-14 ENCOUNTER — Encounter: Payer: Self-pay | Admitting: Gastroenterology

## 2022-06-14 ENCOUNTER — Encounter: Payer: Self-pay | Admitting: Neurology

## 2022-06-14 ENCOUNTER — Ambulatory Visit: Payer: Medicare Other | Admitting: Gastroenterology

## 2022-06-14 DIAGNOSIS — Z9889 Other specified postprocedural states: Secondary | ICD-10-CM

## 2022-06-14 DIAGNOSIS — R1310 Dysphagia, unspecified: Secondary | ICD-10-CM

## 2022-06-14 DIAGNOSIS — R197 Diarrhea, unspecified: Secondary | ICD-10-CM | POA: Diagnosis not present
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
## 2022-06-17 DIAGNOSIS — I509 Heart failure, unspecified: Secondary | ICD-10-CM | POA: Diagnosis not present

## 2022-06-17 DIAGNOSIS — M1990 Unspecified osteoarthritis, unspecified site: Secondary | ICD-10-CM | POA: Diagnosis not present

## 2022-06-18 ENCOUNTER — Ambulatory Visit (INDEPENDENT_AMBULATORY_CARE_PROVIDER_SITE_OTHER): Payer: Medicare Other | Admitting: Pharmacist

## 2022-06-18 DIAGNOSIS — E1165 Type 2 diabetes mellitus with hyperglycemia: Secondary | ICD-10-CM

## 2022-06-18 DIAGNOSIS — E1169 Type 2 diabetes mellitus with other specified complication: Secondary | ICD-10-CM

## 2022-06-18 DIAGNOSIS — I5032 Chronic diastolic (congestive) heart failure: Secondary | ICD-10-CM

## 2022-06-18 DIAGNOSIS — I10 Essential (primary) hypertension: Secondary | ICD-10-CM

## 2022-06-18 MED ORDER — METFORMIN HCL ER 500 MG PO TB24: 500.0000 mg | ORAL_TABLET | Freq: Every day | ORAL | 1 refills | Status: DC

## 2022-06-18 NOTE — Chronic Care Management (AMB) (Signed)
Chronic Care Management Pharmacy Note  06/18/2022 Name:  Garrett [name] MRN:  [id number] DOB:  14-Jan-1951  Summary:  Patient is having upper and lower GI symptoms. Diarrhea could be related to metformin. Plan to change metformin to ER 575m 1 tablet daily with food.  Also due to have labs recheck ( patient forgot to get checked before leaving office 06/07/2022 - lab appointment made) Subjective: MKRISTINE TILEYis an 71y.o. year old male who is a primary patient of Garrett Held DO.  The CCM team was consulted for assistance with disease management and care coordination needs.    Engaged with patient by telephone for follow up visit in response to provider referral for pharmacy case management and/or care coordination services.   Consent to Services:  The patient was given information about Chronic Care Management services, agreed to services, and gave verbal consent prior to initiation of services.  Please see initial visit note for detailed documentation.   Patient Care Team: LCarollee [name] YAlferd Apa DO as PCP - General (Family Medicine) JMilus Banister MD (Gastroenterology) PEarnie Larsson MD (Neurosurgery) WTanda Rockers MD as Consulting Physician (Pulmonary Disease) ACameron Sprang MD as Consulting Physician (Neurology) PEarnie Larsson MD as Consulting Physician (Neurosurgery) BMarlaine Hind MD as Consulting Physician (Physical Medicine and Rehabilitation) ECherre [name] RDennis [name](Pharmacist) RRalene Ok MD as Consulting Physician (General Surgery) MGardiner [name] DPM as Consulting Physician (Podiatry) RAntionette [name] YIsaias Cowman MD as Referring Physician (Ophthalmology)  Recent office visits: 06/07/2022 - Fam Med (Dr LCarollee [name] F/U chronic conditions. Patient mentions he does not think gabapentin is helping and would like to discontinue. Decreased dose of gabapentin to 1069mdaily. Ordered PT;  02/21/2022 - Fam Med (Dr LoCarollee HerterF/U hospitalization for  pneumonia. Prescribed albuterol neb solution to use up to every 6 hours as needed. Refilled gabapentin  Recent consult visits: 06/14/2022 - GI (Dr JaArdis HughsSeen for diarrhea. Ordered stool profile. Timing of onset shortly hafter he started mag oxide and double his metformin.  He decreased the metforming back to previous dose and noticed significant improvement in his diarrhea. 06/05/2022 - Neuro (Dr AqDelice LeschF/U weakness in legs and hand. Labs checked  H[facility]: [facility]17/2022 [facility] New medications: cefdinir 30029mwice a day for 4 more days; Mucinex DM every 12 hours for 10 days; prednisone 28m45mily for 3 days.  [facility] adn [facility]. Advised to go to ED / hospital.  [facility] Prescrived azithrymycin 250mg81m 5 days; hydrocodone syrup as needed for cough.  01/28/2022 - [facility] for acute maxillary sinusitis and cough. Prescribed Augmentin bid for 7 days and benzonatate Performed medication review today to reconcile discharge medications and medication list in Epic.    Objective:  Lab Results  Component Value Date   CREATININE 1.16 11/12/2021   CREATININE 1.41 08/30/2021   CREATININE 1.11 08/19/2021    Lab Results  Component Value Date   HGBA1C 7.2 (H) 11/12/2021   Last diabetic Eye exam:  Lab Results  Component Value Date/Time   HMDIABEYEEXA No Retinopathy 12/21/2018 12:34 PM    Last diabetic Foot exam: No results found for: "HMDIABFOOTEX"      Component Value Date/Time   CHOL 135 11/12/2021 1114   CHOL 162 07/18/2015 0744   TRIG 200.0 (  H) 11/12/2021 1114   TRIG 110 07/18/2015 0744   HDL 32.00 (L) 11/12/2021 1114   HDL 41 07/18/2015 0744   CHOLHDL 4 11/12/2021 1114   VLDL 40.0 11/12/2021 1114   LDLCALC 63 11/12/2021 1114    LDLCALC 129 (H) 08/17/2020 0948   LDLCALC 99 07/18/2015 0744   LDLDIRECT 104.0 02/14/2020 1025       Latest Ref Rng & Units 06/07/2022   10:13 AM 11/12/2021   11:14 AM 08/30/2021    2:58 PM  Hepatic Function  Total Protein 6.1 - 8.1 g/dL 7.5  6.5  7.2   Albumin 3.5 - 5.2 g/dL  4.0  4.1   AST 0 - 37 U/L  45  29   ALT 0 - 53 U/L  52  43   Alk Phosphatase 39 - 117 U/L  45  60   Total Bilirubin 0.2 - 1.2 mg/dL  0.4  0.6     Lab Results  Component Value Date/Time   TSH 2.75 06/07/2022 10:13 AM   TSH 1.30 04/28/2018 10:16 AM       Latest Ref Rng & Units 04/19/2022    4:12 PM 11/12/2021   11:14 AM 08/30/2021    2:58 PM  CBC  WBC 3.8 - 10.8 Thousand/uL 6.6  7.4  7.0   Hemoglobin 13.2 - 17.1 g/dL 12.4  12.0  12.8   Hematocrit 38.5 - 50.0 % 37.5  36.6  39.8   Platelets 140 - 400 Thousand/uL 286  261.0  362.0     No results found for: "VD25OH"  Clinical ASCVD: Yes  The 10-year ASCVD risk score (Arnett DK, et al., 2019) is: 32.2%   Values used to calculate the score:     Age: 71 years     Sex: Male     Is Non-Hispanic African American: No     Diabetic: Yes     Tobacco smoker: No     Systolic Blood Pressure: 149 mmHg     Is BP treated: Yes     HDL Cholesterol: 32 mg/dL     Total Cholesterol: 135 mg/dL      Social History   Tobacco Use  Smoking Status Former   Packs/day: 3.00   Years: 30.00   Total pack years: 90.00   Types: Cigarettes   Quit date: 01/09/1991   Years since quitting: 31.4  Smokeless Tobacco Former   Types: Snuff   BP Readings from Last 3 Encounters:  06/14/22 108/68  06/07/22 128/88  06/05/22 122/80   Pulse Readings from Last 3 Encounters:  06/14/22 70  06/07/22 85  06/05/22 73   Wt Readings from Last 3 Encounters:  06/14/22 204 lb 6 oz (92.7 kg)  06/07/22 204 lb 3.2 oz (92.6 kg)  06/05/22 204 lb (92.5 kg)    Assessment: Review of patient past medical history, allergies, medications, health status, including review of consultants reports,  laboratory and other test data, was performed as part of comprehensive evaluation and provision of chronic care management services.   SDOH:  (Social Determinants of Health) assessments and interventions performed:  SDOH Interventions    Flowsheet Row Most Recent Value  SDOH Interventions   Financial Strain Interventions Other (Comment)  [assisted with PAP for Ozempic - good thru 12/22/2022,  PAP not available for Xarelto for 2023 for Medicare patients.]  Transportation Interventions Intervention Not Indicated         CCM Care Plan  Allergies  Allergen Reactions   Bee Venom Anaphylaxis  Garlic Swelling    Medications Reviewed Today     Reviewed by Garrett [name], RPH-CPP (Pharmacist) on 06/18/22 at 80  Med List Status: <None>   Medication Order Taking? Sig Documenting Provider Last Dose Status Informant  albuterol (PROVENTIL) (2.5 MG/3ML) 0.083% nebulizer solution [id number] Yes Take 3 mLs (2.5 mg total) by nebulization every 6 (six) hours as needed for wheezing or shortness of breath. Garrett [name], Kendrick Fries R, DO Taking Active   allopurinol (ZYLOPRIM) 100 MG tablet [id number] Yes TAKE 1 TABLET BY MOUTH DAILY Garrett [name], Garrett Apa, DO Taking Active   atorvastatin (LIPITOR) 80 MG tablet [id number] Yes Take by mouth. [provider] Taking Active   budesonide-formoterol (SYMBICORT) 80-4.5 MCG/ACT inhaler [id number] Yes Inhale 2 puffs into the lungs daily. [name] R, DO Taking Active   celecoxib (CELEBREX) 200 MG capsule [id number] Yes TAKE 1 CAPSULE BY MOUTH  TWICE DAILY  Patient taking differently: 2 caps in AM   [name] R, DO Taking Active   EPINEPHrine 0.3 mg/0.3 mL IJ SOAJ injection [id number] Yes Inject 0.3 mg into the muscle as needed for anaphylaxis.  [provider] Taking Active Self  famotidine (PEPCID) 20 MG tablet [id number] Yes Take 1 tablet (20 mg total) by mouth at bedtime.  Patient taking differently: Take 20 mg by mouth at bedtime.  80m in the am   JMilus Banister MD Taking Active Self  fenofibrate 160 MG tablet [id number] Take by mouth. [provider] Taking Active   Ferrous Sulfate (IRON PO) [id number] Take 1 tablet by mouth daily. [provider] Taking Active Self  fluticasone (FLONASE) 50 MCG/ACT nasal spray [id number] Place 2 sprays into both nostrils daily. Garrett Held DO Taking Active Self  furosemide (LASIX) 40 MG tablet [id number] Take 40 mg by mouth See admin instructions. 440min the morning  4054mn evening [provider] Taking Active Self  gabapentin (NEURONTIN) 100 MG capsule [id number] Take 2 capsules (200 mg total) by mouth at bedtime. LowCarollee HertervoKendrick Fries DO Taking Active   levocetirizine (XYZAL) 5 MG tablet [id number] TAKE 1 TABLET BY MOUTH IN  THE EVENING LowAnn HeldO Taking Active   magnesium oxide (MAG-OX) 400 (241.3 Mg) MG tablet [id number] Take 1 tablet (400 mg total) by mouth daily. AbrReyne DumasD Taking Active Self  meclizine (ANTIVERT) 25 MG tablet [id number] Take 1 tablet (25 mg total) by mouth 3 (three) times daily as needed for dizziness. LowAnn HeldO Taking Active Self  memantine (NAMENDA) 10 MG tablet [id number] Take 2 tablets by mouth daily. [provider] Taking Active   metFORMIN (GLUCOPHAGE) 500 MG tablet [id number] Take 1 tablet (500 mg total) by mouth 2 (two) times daily with a meal. LowCarollee HertervoAlferd ApaO Taking Active   metoCLOPramide (REGLAN) 5 MG tablet [id number] Take 1 tablet (5 mg total) by mouth 4 (four) times daily -  before meals and at bedtime. JacMilus BanisterD Taking Active   metoprolol tartrate (LOPRESSOR) 50 MG tablet [id number] Take 50 mg by mouth 2 (two) times daily. [provider] Taking Active Self  Multiple Vitamins-Minerals (ONE-A-DAY WEIGHT SMART ADVANCE PO) [id number] Take 1 tablet by mouth daily. Centrum Silver [provider] Taking Active Self  nitroGLYCERIN (NITROSTAT) 0.4 MG SL tablet [id number] Place 0.4 mg under the tongue every 5 (five) minutes as needed for chest pain. [provider] Taking Active Self  omeprazole (PRILOSEC) 40 MG capsule [id number] Yes Take 1 capsule by mouth daily. [provider] Taking Active   [facility] ULTRA test strip [id number] Yes USE AS DIRECTED 3 TIMES  DAILY Ann Held, DO Taking Active   PROAIR HFA 108 [phone] Base) MCG/ACT inhaler [id number] Yes Inhale 1 puff into the lungs every 6 (six) hours as needed for wheezing or shortness of breath. Ann Held, DO Taking Active Self  Pseudoeph-HYDROcodone (P-V TUSSIN PO) [id number] Yes Take by mouth. [provider] Taking Active   rivaroxaban (XARELTO) 20 MG TABS tablet [id number] Yes Take 1 tablet (20 mg total) by mouth daily with supper. Garrett Rockers, MD Taking Active   Semaglutide,0.25 or 0.5MG/DOS, (OZEMPIC, 0.25 OR 0.5 MG/DOSE,) 2 MG/1.5ML SOPN [id number] Yes Inject 0.5 mg into the skin every Sunday. [provider] Taking Active Self  tadalafil (CIALIS) 20 MG tablet [id number] Yes Take 0.5-1 tablets (10-20 mg total) by mouth every other day as needed for erectile dysfunction. Garrett [name], Kendrick Fries R, DO Taking Active Self  topiramate (TOPAMAX) 50 MG tablet [id number] Yes TAKE 1 TABLET BY MOUTH  TWICE DAILY Garrett Sprang, MD Taking Active   vitamin B-12 (CYANOCOBALAMIN) 1000 MCG tablet [id number] Yes Take 1,000 mcg by mouth daily. [provider] Taking Active Self            Patient Active Problem List   Diagnosis Date Noted   Uncontrolled type 2 diabetes mellitus with hyperglycemia ([place]) 06/07/2022   Hematuria 05/03/2022   Urinary tract infection with hematuria 04/19/2022   Weakness of both lower extremities 02/25/2022   Frequent falls 02/25/2022   Pneumonia due to infectious organism 02/21/2022   Coronary artery disease involving native coronary artery of native  heart without angina pectoris 02/17/2022   Acute recurrent ethmoidal sinusitis 20/94/7096   Metabolic acidosis, normal anion gap (NAG) 02/17/2022   Sensorineural hearing loss (SNHL) of both ears 02/17/2022   Severe sepsis ([place]) 08/14/2021   Pneumonia 08/14/2021   Lactic acidosis 08/14/2021   Hypokalemia 08/14/2021   DOE (dyspnea on exertion) 05/11/2021   Abnormal stress test 04/20/2021   Acute respiratory distress    Acute respiratory failure with hypoxia (HCC)    S/P Nissen fundoplication (without gastrostomy tube) procedure 02/20/2021   Body mass index (BMI) 33.0-33.9, adult 11/03/2019   Laceration of right hand 08/02/2019   Acute pulmonary embolism ([place]) 01/07/2019   Chronic diastolic CHF (congestive heart failure) ([place]) 01/07/2019   Preventative health care 02/10/2018   Dizziness 02/10/2018   Spondylolisthesis at L3-L4 level 10/28/2017   Cough variant asthma  vs uacs/ pseudoasthma 06/17/2017   Pneumonia of right middle lobe due to infectious organism 02/28/2017   Sepsis ([place]) 02/28/2017   Pulmonary embolism and infarction ([place]) 02/09/2017   Bilateral pulmonary embolism ([place]) 02/04/2017   Greater trochanteric bursitis of left hip 01/14/2017   Greater trochanteric bursitis of right hip 12/20/2016   Degenerative arthritis of knee, bilateral 10/08/2016   Upper airway cough syndrome 03/22/2016   Multiple pulmonary nodules 03/22/2016   Acute bronchitis 01/22/2016   Headache disorder 01/04/2016   Acute upper respiratory infection 10/25/2015   Elevated CK 07/18/2015   History of colonic polyps 04/11/2015   Lumbar stenosis with neurogenic claudication 11/14/2014   Spondylolysis of cervical region 02/25/2014   Lumbosacral spondylosis without myelopathy 01/06/2014   OSA (obstructive sleep apnea) 12/08/2013   SOB (shortness of breath) on exertion 11/04/2013   Morbid obesity due to excess calories (  [place])    Cervicalgia    Venous insufficiency of leg 02/18/2012   Itching 02/18/2012    Mild dementia ([place]) 05/28/2011   Hyperlipidemia associated with type 2 diabetes mellitus ([place]) 05/27/2011   Controlled type 2 diabetes mellitus with diabetic nephropathy ([place]) 04/10/2011   Gout 04/10/2011   Essential hypertension 04/10/2011   OA (osteoarthritis) 04/10/2011   GERD (gastroesophageal reflux disease) 04/10/2011   Migraine headache without aura 04/10/2011   Allergic rhinitis, cause unspecified 04/10/2011   Bee sting allergy 04/10/2011   Generalized osteoarthritis of multiple sites 04/10/2011   Hypertension associated with stage 2 chronic kidney disease due to type 2 diabetes mellitus ([place]) 04/10/2011    Immunization History  Administered Date(s) Administered   Fluad Quad(high Dose 65+) 10/20/2018, 10/07/2019, 10/19/2020, 08/30/2021   Influenza Split 09/30/2012   Influenza, High Dose Seasonal PF 09/19/2016, 10/07/2017, 10/16/2018   Influenza,inj,Quad PF,6+ Mos 09/15/2013, 09/26/2014, 09/22/2015   Influenza,inj,quad, With Preservative 09/15/2013, 09/26/2014, 09/22/2015   PFIZER(Purple Top)SARS-COV-2 Vaccination 02/12/2020, 03/07/2020, 08/19/2020   Pneumococcal Conjugate-13 07/18/2015   Pneumococcal Polysaccharide-23 04/10/2011, 09/19/2016   Tdap 04/10/2011, 04/09/2019, 07/22/2019   Zoster, Live 03/28/2014    Conditions to be addressed/monitored: CHF, CAD, HTN, HLD, Hypertriglyceridemia, DMII, and Asthma, neuropathy, migraine headaches, GERD, gout, mild anemia, OSA; recurrent PE with chronic anticoagulation  Care Plan : General Pharmacy (Adult)  Updates made by Garrett [name], RPH-CPP since 06/18/2022 12:00 AM     Problem: asthma, hyperlipidemia; type 2 DM; CAD; CHF; HTN; recurrent PE; chronic anticoagulation; migraines; neuropathy; gout; GERD; mild dementia; vertigo; OSA; seasonal allergies   Priority: High  Onset Date: 08/07/2021  Note:   Current Barriers:  Unable to maintain control of type 2 DM Does not adhere to prescribed medication regimen Education needed  regarding medication regimen and chronic conditions  Pharmacist Clinical Goal(s):  Over the next 90 days, patient will achieve adherence to monitoring guidelines and medication adherence to achieve therapeutic efficacy achieve control of type 2 DM as evidenced by A1c < 7.0 maintain control of HTN, CHF, hyperlipidemia and asthma  as evidenced by attainment of goals listed below  through collaboration with PharmD and provider.   Interventions: 1:1 collaboration with Garrett [name], Garrett Apa, DO regarding development and update of comprehensive plan of care as evidenced by provider attestation and co-signature Inter-disciplinary care team collaboration (see longitudinal plan of care) Comprehensive medication review performed; medication list updated in electronic medical record    Hypertension / CHF:  Controlled; Goal: BP <140/90, minimize symptoms of CHF and prevent CHF exacerbations Current regimen:  Metoprolol tartrate 60m twice a day Furosemide 440meach morning and 4011mach evening Type: Diastolic Last ejection fraction: 60-65% (08/16/2021) - previously on 03/03/17 EF: 65-70% NYHA Class: II (slight limitation of activity) AHA HF Stage: B (Heart disease present - no symptoms present) Patient has failed these meds in past: verapamil Interventions: (addressed at previous visit)  Discussed blood pressure goal Check blood pressure 2 to 3 time per week, document, and provide at future appointments Ensure daily salt intake < 2300 mg/day  Hyperlipidemia / CAD;  LDL at goal; LDL goal < 70 but triglycerides still > goal of 150 Current regimen:  Atorvastatin 55m36mily  Fenofibrate 160mg24mly Interventions: Discussed LDL goal Maintain cholesterol medication regimen - expect triglycerides will improved with improved BG  Diabetes A1c improved but not at goal yet;  A1c goal <7.0 Checks BG: Daily Recent FBG Readings: 130-140's (previously 150-160's) Denie s/sx of hypoglycemia.  Weight  prior to starting ozempic = 224lbs  Current weight = 204 lbs Total weight loss = 20lbs Current regimen:  Metformin 537m twice a day with food Ozempic 0.569minjected once per week (Increased from 0.2510m weeks ago) Previous medications tried: metformin 1000m43md - GI side effects; Victoza daily - stopped after weight loss and improvement in A1c. Approved to received OzemPilgrim's [employer] assistance program thru 11/2022.  Patient has seen GI recently for dysphagia and diarrhea. Mentioned possibly related to diabetes medications Interventions: Discussed A1c goals Continue Ozempic 0.5mg 65mweekly.  Change metformin to ER 500mg 29my to see if diarrhea improves. Reviewed home blood glucose readings and reviewed goals  Fasting blood glucose goal (before meals) = 80 to 130 Blood glucose goal after a meal = less than 180    History of Pulmonary embolism (blood clot)  Controlled; Goal: prevent recurrence of blood clots and prevent stroke Current regimen:  Xarelto 20mg d4m with supper Interventions:  Continue to take Xarelto daily   Asthma: Controlled; Goal: improve breathing and prevent hospitalizations related to asthma exacerbations Current regimen:  Symbicort 80/4.5mcg - 90maler 2 puffs into lungs once a day (during allergy season) Albuterol inhaler or nebulization solution - use as needed for shortness of breath or wheezing Patient has failed these meds in past: Trelegy  Using maintenance inhaler regularly? Yes during allergy season (Spring and Fall)  Frequency of rescue inhaler use: rarely uses currently but during allergy season 1 or 2 time per week Managed by pulmonology - Dr. Wert IntMelvyn Novasntions: Discussed maintenance versus rescue therapy.  Discussed seasonal triggers  Continue to follow up with Dr Wert  CoMelvyn Novasue above medication regimen.   Medication management Pharmacist Clinical Goal(s): Over the next 90 days, patient will work with PharmD and providers to  maintain optimal medication adherence Current pharmacy: [facility] Interventions Comprehensive medication review performed. Continue current medication management strategy Focus on medication adherence by filling and taking medications appropriately  Take medications as prescribed Report any questions or concerns to PharmD and/or provider(s)  Patient Goals/Self-Care Activities Over the next 90 days, patient will:  take medications as prescribed,  check glucose daily, document, and provide at future appointments,  collaborate with provider on medication access solutions. Change metformin to ER 500mg - t69m1 tablet daily with food. Come in for labs 06/21/2022 at 8:20am  Follow Up Plan: Telephone follow up appointment with care management team member scheduled for:  2 to 3  months      Medication Assistance:  Working on [employer]istance program for Ozempic. [employer]dated income documentation. Patient aware and will bring into office in the next week.      [facility] [phone] [facility] ([facility] [address] [facility] [phone] ) - [place]-[phone] box? Yes Pt endorses 99% compliance  Follow Up:  Patient agrees to Care Plan and Follow-up.  Plan: Telephone follow up appointment with care management team member scheduled for:  2 to 3 months   EckCherre RobinsClinical Pharmacist [place] [facility] GaprJolietn[facility]

## 2022-06-21 ENCOUNTER — Other Ambulatory Visit (INDEPENDENT_AMBULATORY_CARE_PROVIDER_SITE_OTHER): Payer: Medicare Other

## 2022-06-21 DIAGNOSIS — E785 Hyperlipidemia, unspecified: Secondary | ICD-10-CM

## 2022-06-21 DIAGNOSIS — Z7984 Long term (current) use of oral hypoglycemic drugs: Secondary | ICD-10-CM

## 2022-06-21 DIAGNOSIS — D649 Anemia, unspecified: Secondary | ICD-10-CM | POA: Diagnosis not present

## 2022-06-21 DIAGNOSIS — Z7985 Long-term (current) use of injectable non-insulin antidiabetic drugs: Secondary | ICD-10-CM

## 2022-06-21 DIAGNOSIS — E1159 Type 2 diabetes mellitus with other circulatory complications: Secondary | ICD-10-CM | POA: Diagnosis not present

## 2022-06-21 DIAGNOSIS — J45909 Unspecified asthma, uncomplicated: Secondary | ICD-10-CM | POA: Diagnosis not present

## 2022-06-21 DIAGNOSIS — I509 Heart failure, unspecified: Secondary | ICD-10-CM

## 2022-06-21 DIAGNOSIS — I2510 Atherosclerotic heart disease of native coronary artery without angina pectoris: Secondary | ICD-10-CM

## 2022-06-21 DIAGNOSIS — I110 Hypertensive heart disease with heart failure: Secondary | ICD-10-CM

## 2022-06-21 LAB — IBC + FERRITIN
Ferritin: 155.5 ng/mL (ref 22.0–322.0)
Iron: 78 ug/dL (ref 42–165)
Saturation Ratios: 16.7 % — ABNORMAL LOW (ref 20.0–50.0)
TIBC: 467.6 ug/dL — ABNORMAL HIGH (ref 250.0–450.0)
Transferrin: 334 mg/dL (ref 212.0–360.0)

## 2022-06-21 LAB — IRON: Iron: 78 ug/dL (ref 42–165)

## 2022-07-01 ENCOUNTER — Other Ambulatory Visit: Payer: Medicare Other

## 2022-07-01 DIAGNOSIS — R1310 Dysphagia, unspecified: Secondary | ICD-10-CM | POA: Diagnosis not present

## 2022-07-01 DIAGNOSIS — Z9889 Other specified postprocedural states: Secondary | ICD-10-CM | POA: Diagnosis not present

## 2022-07-01 DIAGNOSIS — R197 Diarrhea, unspecified: Secondary | ICD-10-CM | POA: Diagnosis not present

## 2022-07-01 DIAGNOSIS — A048 Other specified bacterial intestinal infections: Secondary | ICD-10-CM | POA: Diagnosis not present

## 2022-07-03 LAB — GI PROFILE, STOOL, PCR

## 2022-07-04 ENCOUNTER — Other Ambulatory Visit (HOSPITAL_BASED_OUTPATIENT_CLINIC_OR_DEPARTMENT_OTHER): Payer: Self-pay

## 2022-07-04 ENCOUNTER — Ambulatory Visit (INDEPENDENT_AMBULATORY_CARE_PROVIDER_SITE_OTHER): Payer: Medicare Other

## 2022-07-04 ENCOUNTER — Other Ambulatory Visit: Payer: Self-pay | Admitting: Family Medicine

## 2022-07-04 VITALS — BP 127/84 | HR 67 | Temp 98.5°F | Resp 16 | Ht 69.0 in | Wt 204.0 lb

## 2022-07-04 DIAGNOSIS — Z0000 Encounter for general adult medical examination without abnormal findings: Secondary | ICD-10-CM

## 2022-07-04 MED ORDER — SHINGRIX 50 MCG/0.5ML IM SUSR
INTRAMUSCULAR | 1 refills | Status: DC
  Filled 2022-07-04: qty 0.5, 1d supply, fill #0

## 2022-07-04 NOTE — Patient Instructions (Signed)
Garrett [name] , Thank you for taking time to come for your Medicare Wellness Visit. I appreciate your ongoing commitment to your health goals. Please review the following plan we discussed and let me know if I can assist you in the future.   Screening recommendations/referrals: Colonoscopy: 08/14/21 due 08/14/28 Recommended yearly ophthalmology/optometry visit for glaucoma screening and checkup Recommended yearly dental visit for hygiene and checkup  Vaccinations: Influenza vaccine: up to date Pneumococcal vaccine: up to date Tdap vaccine: up to date Shingles vaccine: Due-May obtain vaccine at your local pharmacy.    Covid-19: Due-May obtain vaccine at your local pharmacy.   Advanced directives: no  Conditions/risks identified: see problem list   Next appointment: Follow up in one year for your annual wellness visit. 07/08/23  Preventive Care 72 Years and Older, Male Preventive care refers to lifestyle choices and visits with your health care provider that can promote health and wellness. What does preventive care include? A yearly physical exam. This is also called an annual well check. Dental exams once or twice a year. Routine eye exams. Ask your health care provider how often you should have your eyes checked. Personal lifestyle choices, including: Daily care of your teeth and gums. Regular physical activity. Eating a healthy diet. Avoiding tobacco and drug use. Limiting alcohol use. Practicing safe sex. Taking low doses of aspirin every day. Taking vitamin and mineral supplements as recommended by your health care provider. What happens during an annual well check? The services and screenings done by your health care provider during your annual well check will depend on your age, overall health, lifestyle risk factors, and family history of disease. Counseling  Your health care provider may ask you questions about your: Alcohol use. Tobacco use. Drug use. Emotional  well-being. Home and relationship well-being. Sexual activity. Eating habits. History of falls. Memory and ability to understand (cognition). Work and work Statistician. Screening  You may have the following tests or measurements: Height, weight, and BMI. Blood pressure. Lipid and cholesterol levels. These may be checked every 5 years, or more frequently if you are over 72 years old. Skin check. Lung cancer screening. You may have this screening every year starting at age 72 if you have a 30-pack-year history of smoking and currently smoke or have quit within the past 15 years. Fecal occult blood test (FOBT) of the stool. You may have this test every year starting at age 72. Flexible sigmoidoscopy or colonoscopy. You may have a sigmoidoscopy every 5 years or a colonoscopy every 10 years starting at age 72. Prostate cancer screening. Recommendations will vary depending on your family history and other risks. Hepatitis C blood test. Hepatitis B blood test. Sexually transmitted disease (STD) testing. Diabetes screening. This is done by checking your blood sugar (glucose) after you have not eaten for a while (fasting). You may have this done every 1-3 years. Abdominal aortic aneurysm (AAA) screening. You may need this if you are a current or former smoker. Osteoporosis. You may be screened starting at age 72 if you are at high risk. Talk with your health care provider about your test results, treatment options, and if necessary, the need for more tests. Vaccines  Your health care provider may recommend certain vaccines, such as: Influenza vaccine. This is recommended every year. Tetanus, diphtheria, and acellular pertussis (Tdap, Td) vaccine. You may need a Td booster every 10 years. Zoster vaccine. You may need this after age 20. Pneumococcal 13-valent conjugate (PCV13) vaccine. One dose is recommended after  age 40. Pneumococcal polysaccharide (PPSV23) vaccine. One dose is recommended after  age 62. Talk to your health care provider about which screenings and vaccines you need and how often you need them. This information is not intended to replace advice given to you by your health care provider. Make sure you discuss any questions you have with your health care provider. Document Released: 01/05/2016 Document Revised: 08/28/2016 Document Reviewed: 10/10/2015 Elsevier Interactive Patient Education  2017 [employer] Prevention in the Home Falls can cause injuries. They can happen to people of all ages. There are many things you can do to make your home safe and to help prevent falls. What can I do on the outside of my home? Regularly fix the edges of walkways and driveways and fix any cracks. Remove anything that might make you trip as you walk through a door, such as a raised step or threshold. Trim any bushes or trees on the path to your home. Use bright outdoor lighting. Clear any walking paths of anything that might make someone trip, such as rocks or tools. Regularly check to see if handrails are loose or broken. Make sure that both sides of any steps have handrails. Any raised decks and porches should have guardrails on the edges. Have any leaves, snow, or ice cleared regularly. Use sand or salt on walking paths during winter. Clean up any spills in your garage right away. This includes oil or grease spills. What can I do in the bathroom? Use night lights. Install grab bars by the toilet and in the tub and shower. Do not use towel bars as grab bars. Use non-skid mats or decals in the tub or shower. If you need to sit down in the shower, use a plastic, non-slip stool. Keep the floor dry. Clean up any water that spills on the floor as soon as it happens. Remove soap buildup in the tub or shower regularly. Attach bath mats securely with double-sided non-slip rug tape. Do not have throw rugs and other things on the floor that can make you trip. What can I do in the  bedroom? Use night lights. Make sure that you have a light by your bed that is easy to reach. Do not use any sheets or blankets that are too big for your bed. They should not hang down onto the floor. Have a firm chair that has side arms. You can use this for support while you get dressed. Do not have throw rugs and other things on the floor that can make you trip. What can I do in the kitchen? Clean up any spills right away. Avoid walking on wet floors. Keep items that you use a lot in easy-to-reach places. If you need to reach something above you, use a strong step stool that has a grab bar. Keep electrical cords out of the way. Do not use floor polish or wax that makes floors slippery. If you must use wax, use non-skid floor wax. Do not have throw rugs and other things on the floor that can make you trip. What can I do with my stairs? Do not leave any items on the stairs. Make sure that there are handrails on both sides of the stairs and use them. Fix handrails that are broken or loose. Make sure that handrails are as long as the stairways. Check any carpeting to make sure that it is firmly attached to the stairs. Fix any carpet that is loose or worn. Avoid having throw rugs  at the top or bottom of the stairs. If you do have throw rugs, attach them to the floor with carpet tape. Make sure that you have a light switch at the top of the stairs and the bottom of the stairs. If you do not have them, ask someone to add them for you. What else can I do to help prevent falls? Wear shoes that: Do not have high heels. Have rubber bottoms. Are comfortable and fit you well. Are closed at the toe. Do not wear sandals. If you use a stepladder: Make sure that it is fully opened. Do not climb a closed stepladder. Make sure that both sides of the stepladder are locked into place. Ask someone to hold it for you, if possible. Clearly mark and make sure that you can see: Any grab bars or  handrails. First and last steps. Where the edge of each step is. Use tools that help you move around (mobility aids) if they are needed. These include: Canes. Walkers. Scooters. Crutches. Turn on the lights when you go into a dark area. Replace any light bulbs as soon as they burn out. Set up your furniture so you have a clear path. Avoid moving your furniture around. If any of your floors are uneven, fix them. If there are any pets around you, be aware of where they are. Review your medicines with your doctor. Some medicines can make you feel dizzy. This can increase your chance of falling. Ask your doctor what other things that you can do to help prevent falls. This information is not intended to replace advice given to you by your health care provider. Make sure you discuss any questions you have with your health care provider. Document Released: 10/05/2009 Document Revised: 05/16/2016 Document Reviewed: 01/13/2015 Elsevier Interactive Patient Education  2017 [employer].
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
## 2022-07-10 ENCOUNTER — Encounter: Payer: Self-pay | Admitting: Medical

## 2022-07-10 ENCOUNTER — Other Ambulatory Visit: Payer: Self-pay | Admitting: Family Medicine

## 2022-07-10 ENCOUNTER — Ambulatory Visit (INDEPENDENT_AMBULATORY_CARE_PROVIDER_SITE_OTHER): Payer: Medicare Other | Admitting: Medical

## 2022-07-10 VITALS — BP 108/68 | HR 86 | Temp 97.5°F | Resp 18 | Ht 69.0 in | Wt 205.6 lb

## 2022-07-10 DIAGNOSIS — R3911 Hesitancy of micturition: Secondary | ICD-10-CM

## 2022-07-10 DIAGNOSIS — R102 Pelvic and perineal pain: Secondary | ICD-10-CM

## 2022-07-10 DIAGNOSIS — R319 Hematuria, unspecified: Secondary | ICD-10-CM | POA: Diagnosis not present

## 2022-07-10 DIAGNOSIS — R059 Cough, unspecified: Secondary | ICD-10-CM

## 2022-07-10 DIAGNOSIS — E1165 Type 2 diabetes mellitus with hyperglycemia: Secondary | ICD-10-CM | POA: Diagnosis not present

## 2022-07-10 DIAGNOSIS — R8290 Unspecified abnormal findings in urine: Secondary | ICD-10-CM | POA: Diagnosis not present

## 2022-07-10 LAB — POC URINALSYSI DIPSTICK (AUTOMATED)
Bilirubin, UA: NEGATIVE
Blood, UA: NEGATIVE
Glucose, UA: NEGATIVE
Ketones, UA: NEGATIVE
Leukocytes, UA: NEGATIVE
Nitrite, UA: NEGATIVE
Protein, UA: NEGATIVE
Spec Grav, UA: 1.015 (ref 1.010–1.025)
Urobilinogen, UA: 0.2 E.U./dL
pH, UA: 5 (ref 5.0–8.0)

## 2022-07-10 MED ORDER — CIPROFLOXACIN HCL 500 MG PO TABS: 500.0000 mg | ORAL_TABLET | Freq: Two times a day (BID) | ORAL | 0 refills | Status: DC

## 2022-07-10 NOTE — Patient Instructions (Addendum)
Hematuria described, hesitant urine flow and pressure over bladder. Symptoms are improving and urine looking clearer by your report.   Will get ua, urine culture, psa, cmp and cbc.  If unable to give urine sample today then bring back urine tomorrow morning.  Follow up 7-10 days or sooner if needed.  Urine was clear and culture pending.  Pending lab results prescribed cipro antibiotic.
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
## 2022-07-11 LAB — CBC WITH DIFFERENTIAL/PLATELET
Basophils Absolute: 0.1 10*3/uL (ref 0.0–0.1)
Basophils Relative: 0.7 % (ref 0.0–3.0)
Eosinophils Absolute: 0.1 10*3/uL (ref 0.0–0.7)
Eosinophils Relative: 1.3 % (ref 0.0–5.0)
HCT: 36.5 % — ABNORMAL LOW (ref 39.0–52.0)
Hemoglobin: 12 g/dL — ABNORMAL LOW (ref 13.0–17.0)
Lymphocytes Relative: 16.9 % (ref 12.0–46.0)
Lymphs Abs: 1.7 10*3/uL (ref 0.7–4.0)
MCHC: 33 g/dL (ref 30.0–36.0)
MCV: 89.7 fl (ref 78.0–100.0)
Monocytes Absolute: 0.7 10*3/uL (ref 0.1–1.0)
Monocytes Relative: 6.5 % (ref 3.0–12.0)
Neutro Abs: 7.5 10*3/uL (ref 1.4–7.7)
Neutrophils Relative %: 74.6 % (ref 43.0–77.0)
Platelets: 279 10*3/uL (ref 150.0–400.0)
RBC: 4.07 Mil/uL — ABNORMAL LOW (ref 4.22–5.81)
RDW: 15.3 % (ref 11.5–15.5)
WBC: 10.1 10*3/uL (ref 4.0–10.5)

## 2022-07-11 LAB — COMPREHENSIVE METABOLIC PANEL
ALT: 36 U/L (ref 0–53)
AST: 27 U/L (ref 0–37)
Albumin: 4.1 g/dL (ref 3.5–5.2)
Alkaline Phosphatase: 44 U/L (ref 39–117)
BUN: 15 mg/dL (ref 6–23)
CO2: 25 mEq/L (ref 19–32)
Calcium: 9.3 mg/dL (ref 8.4–10.5)
Chloride: 105 mEq/L (ref 96–112)
Creatinine, Ser: 1.34 mg/dL (ref 0.40–1.50)
GFR: 53.02 mL/min — ABNORMAL LOW (ref >60.00)
Glucose, Bld: 126 mg/dL — ABNORMAL HIGH (ref 70–99)
Potassium: 3.5 mEq/L (ref 3.5–5.1)
Sodium: 142 mEq/L (ref 135–145)
Total Bilirubin: 0.4 mg/dL (ref 0.2–1.2)
Total Protein: 6.6 g/dL (ref 6.0–8.3)

## 2022-07-11 LAB — HEMOGLOBIN A1C: Hgb A1c MFr Bld: 7 % — ABNORMAL HIGH (ref 4.6–6.5)

## 2022-07-11 LAB — PSA: PSA: 0.74 ng/mL (ref 0.10–4.00)

## 2022-07-12 ENCOUNTER — Encounter: Payer: Self-pay | Admitting: Medical

## 2022-07-12 MED ORDER — CIPROFLOXACIN HCL 500 MG PO TABS
500.0000 mg | ORAL_TABLET | Freq: Two times a day (BID) | ORAL | 0 refills | Status: DC
Start: 2022-07-12 — End: 2022-07-30

## 2022-07-12 NOTE — Addendum Note (Signed)
Addended by: Anabel Halon on: 07/12/2022 11:32 AM   Modules accepted: Orders

## 2022-07-16 ENCOUNTER — Other Ambulatory Visit: Payer: Self-pay | Admitting: Family Medicine

## 2022-07-17 DIAGNOSIS — M1990 Unspecified osteoarthritis, unspecified site: Secondary | ICD-10-CM | POA: Diagnosis not present

## 2022-07-17 DIAGNOSIS — I509 Heart failure, unspecified: Secondary | ICD-10-CM | POA: Diagnosis not present

## 2022-07-26 ENCOUNTER — Encounter: Payer: Self-pay | Admitting: Podiatry

## 2022-07-26 ENCOUNTER — Ambulatory Visit (INDEPENDENT_AMBULATORY_CARE_PROVIDER_SITE_OTHER): Payer: Medicare Other | Admitting: Podiatry

## 2022-07-26 DIAGNOSIS — I872 Venous insufficiency (chronic) (peripheral): Secondary | ICD-10-CM

## 2022-07-26 DIAGNOSIS — E1121 Type 2 diabetes mellitus with diabetic nephropathy: Secondary | ICD-10-CM

## 2022-07-26 DIAGNOSIS — M79675 Pain in left toe(s): Secondary | ICD-10-CM | POA: Diagnosis not present

## 2022-07-26 DIAGNOSIS — M129 Arthropathy, unspecified: Secondary | ICD-10-CM

## 2022-07-26 DIAGNOSIS — B351 Tinea unguium: Secondary | ICD-10-CM | POA: Diagnosis not present

## 2022-07-26 DIAGNOSIS — M79674 Pain in right toe(s): Secondary | ICD-10-CM

## 2022-07-26 DIAGNOSIS — E1149 Type 2 diabetes mellitus with other diabetic neurological complication: Secondary | ICD-10-CM

## 2022-07-26 NOTE — Progress Notes (Signed)
This patient returns to my office for at risk foot care.  This patient requires this care by a professional since this patient will be at risk due to having diabetes and coagulation defect. This patient is unable to cut nails himself since the patient cannot reach his nails.These nails are painful walking and wearing shoes.  This patient presents for at risk foot care today.  General Appearance  Alert, conversant and in no acute stress.  Vascular  Dorsalis pedis and posterior tibial  pulses are palpable  bilaterally.  Capillary return is within normal limits  bilaterally. Temperature is within normal limits  bilaterally.  Neurologic  Senn-Weinstein monofilament wire test diminished/absent bilaterally. Muscle power within normal limits bilaterally.  Nails Thick disfigured discolored nails with subungual debris  from hallux to fifth toes bilaterally. No evidence of bacterial infection or drainage bilaterally.  Orthopedic  No limitations of motion  feet .  No crepitus or effusions noted.  No bony pathology or digital deformities noted.  Midfoot arthritis  B/L.  Skin  normotropic skin with no porokeratosis noted bilaterally.  No signs of infections or ulcers noted.     Onychomycosis  Pain in right toes  Pain in left toes  Consent was obtained for treatment procedures.   Mechanical debridement of nails 1-5  bilaterally performed with a nail nipper.  Filed with dremel without incident.    Return office visit    4  months                 Told patient to return for periodic foot care and evaluation due to potential at risk complications.   Marcellous Snarski DPM   

## 2022-07-30 ENCOUNTER — Ambulatory Visit (INDEPENDENT_AMBULATORY_CARE_PROVIDER_SITE_OTHER): Payer: Medicare Other | Admitting: Pharmacist

## 2022-07-30 ENCOUNTER — Other Ambulatory Visit: Payer: Self-pay | Admitting: Family Medicine

## 2022-07-30 DIAGNOSIS — E1165 Type 2 diabetes mellitus with hyperglycemia: Secondary | ICD-10-CM

## 2022-07-30 DIAGNOSIS — E1121 Type 2 diabetes mellitus with diabetic nephropathy: Secondary | ICD-10-CM

## 2022-07-30 DIAGNOSIS — I2699 Other pulmonary embolism without acute cor pulmonale: Secondary | ICD-10-CM

## 2022-07-30 DIAGNOSIS — E1169 Type 2 diabetes mellitus with other specified complication: Secondary | ICD-10-CM

## 2022-07-30 DIAGNOSIS — R319 Hematuria, unspecified: Secondary | ICD-10-CM

## 2022-07-30 DIAGNOSIS — I10 Essential (primary) hypertension: Secondary | ICD-10-CM

## 2022-07-30 NOTE — Patient Instructions (Signed)
Mr. Dygert It was a pleasure speaking with you today.  Below is a summary of your health goals and summary of our recent visit. You can also view your updated Chronic Care Management Care plan through your MyChart account.    Patient Goals/Self-Care Activities Over the next 90 days, patient will:  take medications as prescribed,  check glucose daily, document, and provide at future appointments,  collaborate with provider on medication access solutions. Continue metformin to ER '500mg'$  - take 1 tablet daily with food. Change memantine to take '10mg'$  - 1 tablet twice a day Check into [employer] program to see if would lower cost of Xarelto (see below)   As always if you have any questions or concerns especially regarding medications, please feel free to contact me either at the phone number below or with a MyChart message.   Keep up the good work!  Cherre Robins, PharmD Clinical Pharmacist [name] [phone] (direct line)  [phone] (main office number)  Enrolling in [place] may help lower the cost of [place] is an affordability program to help address a coverage gap for both commercial and government-insured patients. Enroll today and, if you're eligible, you'll benefit from lower prescription costs for XARELTO, automatic refills, and your prescription delivered right to your door.   Sign up for [employer] by calling  888-XARELTO [phone]).   Affordability Support  Pay $85, plus sales tax if applicable, for a 91-MBW (6-month supply of XARELTO  Or pay $240 for a 90-day (315-monthsupply of XARELTO ($80 per month), plus sales tax if applicable, if you and your doctor choose a 90-day supply.  Provide payment and shipping information online for your prescription of XARELTO and have it conveniently delivered directly to your door.  Participate without paying a membership fee or sharing your income  information. This program can even help those who may not have qualified for affordability support in the past.  Register beginning April 1 and get refills until December 31, and you can discontinue anytime. Terms expire at the end of each calendar year and may change.    Program Requirements  There are 2 simple questions to see if you are eligible.  1 Do you have insurance covering a portion of the cost of XARELTO?  2 Are you being asked to pay more than $85 monthly for XARELTO through your insurance?   Other Requirements  This program is only available for patients who are taking XARELTO in accordance with the Prescribing Information. It is not valid for any prescription written with off-label dosing.  This program is not insurance and should not take the place of insurance.  The cost ($85 or $240, plus sales tax if applicable) will not count toward your deductible or cumulative out-of-pocket spend. The cost may not be submitted as a claim for payment to any third-party payer or pharmaceutical patient assistance foundation.  If you choose, you may submit the cost to a Flexible Spending Account (FSA) or a Health Savings Account ([place] Check with your employer if the cost may be submitted to a Health Reimbursement Account (HRA).  Registration begins April 1 each year and refills are available through December 31. Terms expire at the end of each calendar year and may change.  Before you register in the [employer] is important you understand that you will be asked to provide personal information that may include your name, address, phone number, email address, and  information related to your prescription insurance and treatment. This information will be used by the mail-order pharmacy for the program, in accordance with their privacy practices, to determine your eligibility, enroll you in the program, and administer the program. The information will also be used to learn more about  the people who use the program and improve the information we give them. It will be shared with companies supporting the program and your insurance provider, as required by law.  This program offer may not be used with any other coupon, discount, prescription savings card, free trial, or other offer. Offer good only in the [place] and [place]. Void where prohibited or limited by law.    Patient verbalizes understanding of instructions and care plan provided today and agrees to view in [place]. Active MyChart status and patient understanding of how to access instructions and care plan via MyChart confirmed with patient.

## 2022-07-30 NOTE — Chronic Care Management (AMB) (Signed)
Chronic Care Management Pharmacy Note  07/30/2022 Name:  Garrett [name] MRN:  [id number] DOB:  May 28, 1951  Summary:  Patient is having upper and lower GI symptoms. He reports diarrhea has improved since changed to metformin to ER 563m 1 tablet daily with food. He has lost about 20lbs over the last year. Patient reports this was unintended weight loss but could be related to either Ozempic or GI issues. [facility] We did discuss that Ozempic can be associated with increase in nausea and slows transit of food in his stomach. Patient states he feels that symptoms are not related to Ozempic and started prior to starting Ozempic. Home blood glucose reading have been 120 to 160. Seen recently for hematuria. Reports hematuria improved a little with a course of ciprofloxacin but he is still experience blood in his urine about 1 or 2 days per week. He even tried holding Xarelto for 1 week but did not notice a difference. Could consider lowering dose of Xarleto to 151mdaily since has been on 2075mor > 2 years, though hematology may prefer higher dose since patient had recurrence of PE in 2020 while taking aspirin 325m20mily. Sending message to PCP about hematuria. Consider referral back to hematology (Dr EnneMarin Olpo evaluate for alternative anticoagulation and/or to urology for work up of hematuria.  Medicaiton Adherence reviewed. Epic showing last refill for metoprolol tartrate 50mg47m 04/19/2022 for 90 days. [facility] and they last filled for 90 days 07/10/2022 Also checked RF for atorvastatin 80mg 72mr Epic LF was 02/11/2022 but [facility] confirmed was filled for 100 days on 06/02/2022; Checked fenofibrate 160mg -65m Epic Last RF was 02/11/2021 for 100 days but [facility] confirmed was filled 05/17/2022 for 100 days.  Subjective: [name]71 y.o.73ear old male who is a primary patient of Garrett [name] HeldThe CCM team was consulted for  assistance with disease management and care coordination needs.    Engaged with patient by telephone for follow up visit in response to provider referral for pharmacy case management and/or care coordination services.   Consent to Services:  The patient was given information about Chronic Care Management services, agreed to services, and gave verbal consent prior to initiation of services.  Please see initial visit note for detailed documentation.   Patient Care Team: Garrett CCarollee Hertere Alferd Apa PCP - General (Family Medicine) Jacobs,Milus Banisterastroenterology) Pool, HEarnie Larssoneurosurgery) Wert, MTanda Rockers Consulting Physician (Pulmonary Disease) Aquino,Cameron Sprang Consulting Physician (Neurology) Pool, HEarnie Larsson Consulting Physician (Neurosurgery) Bartko,Marlaine Hind Consulting Physician (Physical Medicine and Rehabilitation) ,Cherre RobinsPPStony Prairieacist) RamirezRalene Ok Consulting Physician (General Surgery) Mayer, Gardiner Barefoots Consulting Physician (Podiatry) RadioncAntionette Fairy VIsaias Cowman Referring Physician (Ophthalmology)  Recent office visits: [facility] Labs ordered. UA was clear - culture pending. Given Rx for ciprofloxacine 500mg tw35ma day. 06/07/2022 - Fam Med (Dr Garrett-ChCarollee Herterronic conditions. Patient mentions he does not think gabapentin is helping and would like to discontinue. Decreased dose of gabapentin to 100mg dai62mOrdered PT;  02/21/2022 - Fam Med (Dr Garrett-ChaCarollee Herterpitalization for pneumonia. Prescribed albuterol neb solution to use up to every 6 hours as needed. Refilled gabapentin  Recent consult visits: 07/26/2022 - Podiatry (Dr Mayer) SePrudence Davidsonr onychomycosis of toenails of both feet.  Ordered diabetic shoes.  06/14/2022 - GI (Dr [name]) Seen for diarrhea. Ordered stool profile. Timing of onset shortly hafter he started mag oxide and double his metformin.  He  decreased the metforming back to previous dose and noticed significant improvement in his diarrhea. 06/05/2022 - Neuro (Dr Delice Lesch) F/U weakness in legs and hand. Labs checked  Hospital visits: 02/17/2022 - to 02/19/2022 [facility] Admission at [facility] for pneumonia and asthma exacerbation.. New medications: cefdinir 342m twice a day for 4 more days; Mucinex DM every 12 hours for 10 days; prednisone 238mdaily for 3 days.  [facility]. Advised to go to ED / hospital.  [facility]. Prescrived azithrymycin 25070mor 5 days; hydrocodone syrup as needed for cough.  [facility] and [facility]. Prescribed Augmentin bid for 7 days and benzonatate Performed medication review today to reconcile discharge medications and medication list in Epic.    Objective:  Lab Results  Component Value Date   CREATININE 1.34 07/10/2022   CREATININE 1.16 11/12/2021   CREATININE 1.41 08/30/2021    Lab Results  Component Value Date   HGBA1C 7.0 (H) 07/10/2022   Last diabetic Eye exam:  Lab Results  Component Value Date/Time   HMDIABEYEEXA No Retinopathy 12/21/2018 12:34 PM    Last diabetic Foot exam: No results found for: "HMDIABFOOTEX"      Component Value Date/Time   CHOL 135 11/12/2021 1114   CHOL 162 07/18/2015 0744   TRIG 200.0 (H) 11/12/2021 1114   TRIG 110 07/18/2015 0744   HDL 32.00 (L) 11/12/2021 1114   HDL 41 07/18/2015 0744   CHOLHDL 4 11/12/2021 1114   VLDL 40.0 11/12/2021 1114   LDLCALC 63 11/12/2021 1114   LDLCALC 129 (H) 08/17/2020 0948   LDLCALC 99 07/18/2015 0744   LDLDIRECT 104.0 02/14/2020 1025       Latest Ref Rng & Units 07/10/2022    4:11 PM 06/07/2022   10:13 AM 11/12/2021   11:14 AM  Hepatic Function  Total Protein 6.0 - 8.3 g/dL 6.6  7.5  6.5   Albumin 3.5 - 5.2  g/dL 4.1   4.0   AST 0 - 37 U/L 27   45   ALT 0 - 53 U/L 36   52   Alk Phosphatase 39 - 117 U/L 44   45   Total Bilirubin 0.2 - 1.2 mg/dL 0.4   0.4     Lab Results  Component Value Date/Time   TSH 2.75 06/07/2022 10:13 AM   TSH 1.30 04/28/2018 10:16 AM       Latest Ref Rng & Units 07/10/2022    4:11 PM 04/19/2022    4:12 PM 11/12/2021   11:14 AM  CBC  WBC 4.0 - 10.5 K/uL 10.1  6.6  7.4   Hemoglobin 13.0 - 17.0 g/dL 12.0  12.4  12.0   Hematocrit 39.0 - 52.0 % 36.5  37.5  36.6   Platelets 150.0 - 400.0 K/uL 279.0  286  261.0     No results found for: "VD25OH"  Clinical ASCVD: Yes  The 10-year ASCVD risk score (Arnett DK, et al., 2019) is: 32.2%   Values used to calculate the score:     Age: [age]     Sex: Male     Is Non-Hispanic African American: No  Diabetic: Yes     Tobacco smoker: No     Systolic Blood Pressure: 664 mmHg     Is BP treated: Yes     HDL Cholesterol: 32 mg/dL     Total Cholesterol: 135 mg/dL      Social History   Tobacco Use  Smoking Status Former   Packs/day: 3.00   Years: 30.00   Total pack years: 90.00   Types: Cigarettes   Quit date: 01/09/1991   Years since quitting: 31.5  Smokeless Tobacco Former   Types: Snuff   BP Readings from Last 3 Encounters:  07/10/22 108/68  07/04/22 127/84  06/14/22 108/68   Pulse Readings from Last 3 Encounters:  07/10/22 86  07/04/22 67  06/14/22 70   Wt Readings from Last 3 Encounters:  07/10/22 205 lb 9.6 oz (93.3 kg)  07/04/22 204 lb (92.5 kg)  06/14/22 204 lb 6 oz (92.7 kg)    Assessment: Review of patient past medical history, allergies, medications, health status, including review of consultants reports, laboratory and other test data, was performed as part of comprehensive evaluation and provision of chronic care management services.   SDOH:  (Social Determinants of Health) assessments and interventions performed:       CCM Care Plan  Allergies  Allergen Reactions   Bee Venom  Anaphylaxis   Garlic Swelling    Medications Reviewed Today     Reviewed by Cherre Robins, RPH-CPP (Pharmacist) on 07/30/22 at 37  Med List Status: <None>   Medication Order Taking? Sig Documenting Provider Last Dose Status Informant  albuterol (PROVENTIL) (2.5 MG/3ML) 0.083% nebulizer solution [id number] Yes Take 3 mLs (2.5 mg total) by nebulization every 6 (six) hours as needed for wheezing or shortness of breath. Carollee Herter, Kendrick Fries R, DO Taking Active   allopurinol (ZYLOPRIM) 100 MG tablet [id number] Yes TAKE 1 TABLET BY MOUTH DAILY Carollee Herter, Alferd Apa, DO Taking Active   atorvastatin (LIPITOR) 80 MG tablet [id number] Yes Take 80 mg by mouth daily. [provider] Taking Active   celecoxib (CELEBREX) 200 MG capsule [id number] Yes TAKE 1 CAPSULE BY MOUTH  TWICE DAILY  Patient taking differently: 2 caps in AM   [name] R, DO Taking Active   EPINEPHrine 0.3 mg/0.3 mL IJ SOAJ injection [id number] Yes Inject 0.3 mg into the muscle as needed for anaphylaxis.  [provider] Taking Active Self  famotidine (PEPCID) 20 MG tablet [id number] Yes Take 1 tablet (20 mg total) by mouth at bedtime.  Patient taking differently: Take 20 mg by mouth 2 (two) times daily. 49m in the am   JMilus Banister MD Taking Active Self  fenofibrate 160 MG tablet [id number] Take by mouth. [provider] Taking Active   Ferrous Sulfate (IRON PO) [id number] Take 1 tablet by mouth daily. [provider] Taking Active Self  fluticasone (FLONASE) 50 MCG/ACT nasal spray [id number] Place 2 sprays into both nostrils daily. LAnn Held DO Taking Active Self  furosemide (LASIX) 40 MG tablet [id number] Take 40 mg by mouth 2 (two) times daily. 449min the morning  4073mn evening [provider] Taking Active Self  gabapentin (NEURONTIN) 100 MG capsule [id number] Take 2 capsules (200 mg total) by mouth at bedtime.  Patient taking differently: Take  100 mg by mouth at bedtime.   LowRoma Schanz DO Taking Active   levocetirizine (XYZAL) 5 MG tablet [id number] TAKE 1 TABLET BY MOUTH IN THE  EVENING [name] R, DO Taking Active   meclizine (ANTIVERT) 25 MG tablet [id number] Yes Take 1 tablet (25 mg total) by mouth 3 (three) times daily as needed for dizziness. Ann Held, DO Taking Active Self  memantine (NAMENDA) 10 MG tablet [id number] Yes Take 10 mg by mouth 2 (two) times daily. [provider] Taking Active   metFORMIN (GLUCOPHAGE-XR) 500 MG 24 hr tablet [id number] Yes Take 1 tablet (500 mg total) by mouth daily with breakfast. Carollee Herter, Alferd Apa, DO Taking Active   metoCLOPramide (REGLAN) 5 MG tablet [id number] Yes Take 1 tablet (5 mg total) by mouth 4 (four) times daily -  before meals and at bedtime. Milus Banister, MD Taking Active   metoprolol tartrate (LOPRESSOR) 50 MG tablet [id number] Yes Take 50 mg by mouth 2 (two) times daily. [provider] Taking Active Self  Multiple Vitamins-Minerals (ONE-A-DAY WEIGHT SMART ADVANCE PO) [id number] Yes Take 1 tablet by mouth daily. Centrum Silver [provider] Taking Active Self  nitroGLYCERIN (NITROSTAT) 0.4 MG SL tablet [id number] Yes Place 0.4 mg under the tongue every 5 (five) minutes as needed for chest pain. [provider] Taking Active Self  omeprazole (PRILOSEC) 40 MG capsule [id number] Yes Take 1 capsule by mouth daily. [provider] Taking Active   [facility] ULTRA test strip [id number] Yes USE AS DIRECTED 3 TIMES  DAILY Ann Held, DO Taking Active   PROAIR HFA 108 [id number] Base) MCG/ACT inhaler [id number] Yes Inhale 1 puff into the lungs every 6 (six) hours as needed for wheezing or shortness of breath. Ann Held, DO Taking Active Self  rivaroxaban (XARELTO) 20 MG TABS tablet [id number] Yes Take 1 tablet (20 mg total) by mouth daily with supper. Tanda Rockers, MD Taking Active   Semaglutide,0.25  or 0.5MG/DOS, (OZEMPIC, 0.25 OR 0.5 MG/DOSE,) 2 MG/1.5ML SOPN [id number] Yes Inject 0.5 mg into the skin every Sunday. [provider] Taking Active Self  SYMBICORT 80-4.5 MCG/ACT inhaler [id number] Yes USE 2 INHALATIONS BY MOUTH DAILY Carollee Herter, Alferd Apa, DO Taking Active   tadalafil (CIALIS) 20 MG tablet [id number] No Take 0.5-1 tablets (10-20 mg total) by mouth every other day as needed for erectile dysfunction.  Patient not taking: Reported on 07/30/2022   Ann Held, DO Not Taking Active Self  topiramate (TOPAMAX) 50 MG tablet [id number] Yes TAKE 1 TABLET BY MOUTH  TWICE DAILY Cameron Sprang, MD Taking Active   vitamin B-12 (CYANOCOBALAMIN) 1000 MCG tablet [id number] Yes Take 1,000 mcg by mouth daily. [provider] Taking Active Self            Patient Active Problem List   Diagnosis Date Noted   Uncontrolled type 2 diabetes mellitus with hyperglycemia ([place]) 06/07/2022   Hematuria 05/03/2022   Urinary tract infection with hematuria 04/19/2022   Weakness of both lower extremities 02/25/2022   Frequent falls 02/25/2022   Pneumonia due to infectious organism 02/21/2022   Coronary artery disease involving native coronary artery of native heart without angina pectoris 02/17/2022   Acute recurrent ethmoidal sinusitis 01/00/7121   Metabolic acidosis, normal anion gap (NAG) 02/17/2022   Sensorineural hearing loss (SNHL) of both ears 02/17/2022   Severe sepsis ([place]) 08/14/2021   Pneumonia 08/14/2021   Lactic acidosis 08/14/2021   Hypokalemia 08/14/2021   DOE (dyspnea on exertion) 05/11/2021   Abnormal stress test 04/20/2021   Acute respiratory distress    Acute respiratory failure with hypoxia ([place])  S/P Nissen fundoplication (without gastrostomy tube) procedure 02/20/2021   Body mass index (BMI) 33.0-33.9, adult 11/03/2019   Laceration of right hand 08/02/2019   Acute pulmonary embolism (HCC) 01/07/2019   Chronic diastolic CHF (congestive heart failure)  ([place]) 01/07/2019   Preventative health care 02/10/2018   Dizziness 02/10/2018   Spondylolisthesis at L3-L4 level 10/28/2017   Cough variant asthma  vs uacs/ pseudoasthma 06/17/2017   Pneumonia of right middle lobe due to infectious organism 02/28/2017   Sepsis ([place]) 02/28/2017   Pulmonary embolism and infarction ([place]) 02/09/2017   Bilateral pulmonary embolism ([place]) 02/04/2017   Greater trochanteric bursitis of left hip 01/14/2017   Greater trochanteric bursitis of right hip 12/20/2016   Degenerative arthritis of knee, bilateral 10/08/2016   Upper airway cough syndrome 03/22/2016   Multiple pulmonary nodules 03/22/2016   Acute bronchitis 01/22/2016   Headache disorder 01/04/2016   Acute upper respiratory infection 10/25/2015   Elevated CK 07/18/2015   History of colonic polyps 04/11/2015   Lumbar stenosis with neurogenic claudication 11/14/2014   Spondylolysis of cervical region 02/25/2014   Lumbosacral spondylosis without myelopathy 01/06/2014   OSA (obstructive sleep apnea) 12/08/2013   SOB (shortness of breath) on exertion 11/04/2013   Morbid obesity due to excess calories ([place])    Cervicalgia    Venous insufficiency of leg 02/18/2012   Itching 02/18/2012   Mild dementia ([place]) 05/28/2011   Hyperlipidemia associated with type 2 diabetes mellitus ([place]) 05/27/2011   Controlled type 2 diabetes mellitus with diabetic nephropathy ([place]) 04/10/2011   Gout 04/10/2011   Essential hypertension 04/10/2011   OA (osteoarthritis) 04/10/2011   GERD (gastroesophageal reflux disease) 04/10/2011   Migraine headache without aura 04/10/2011   Allergic rhinitis, cause unspecified 04/10/2011   Bee sting allergy 04/10/2011   Generalized osteoarthritis of multiple sites 04/10/2011   Hypertension associated with stage 2 chronic kidney disease due to type 2 diabetes mellitus ([place]) 04/10/2011    Immunization History  Administered Date(s) Administered   Fluad Quad(high Dose 65+) 10/20/2018,  10/07/2019, 10/19/2020, 08/30/2021   Influenza Split 09/30/2012   Influenza, High Dose Seasonal PF 09/19/2016, 10/07/2017, 10/16/2018   Influenza,inj,Quad PF,6+ Mos 09/15/2013, 09/26/2014, 09/22/2015   Influenza,inj,quad, With Preservative 09/15/2013, 09/26/2014, 09/22/2015   PFIZER(Purple Top)SARS-COV-2 Vaccination 02/12/2020, 03/07/2020, 08/19/2020   Pneumococcal Conjugate-13 07/18/2015   Pneumococcal Polysaccharide-23 04/10/2011, 09/19/2016   Tdap 04/10/2011, 04/09/2019, 07/22/2019   Zoster Recombinat (Shingrix) 07/04/2022   Zoster, Live 03/28/2014    Conditions to be addressed/monitored: CHF, CAD, HTN, HLD, Hypertriglyceridemia, DMII, and Asthma, neuropathy, migraine headaches, GERD, gout, mild anemia, OSA; recurrent PE with chronic anticoagulation  Care Plan : General Pharmacy (Adult)  Updates made by Cherre Robins, RPH-CPP since 07/30/2022 12:00 AM     Problem: asthma, hyperlipidemia; type 2 DM; CAD; CHF; HTN; recurrent PE; chronic anticoagulation; migraines; neuropathy; gout; GERD; mild dementia; vertigo; OSA; seasonal allergies   Priority: High  Onset Date: 08/07/2021  Note:   Current Barriers:  Unable to maintain control of type 2 DM Does not adhere to prescribed medication regimen Education needed regarding medication regimen and chronic conditions  Pharmacist Clinical Goal(s):  Over the next 90 days, patient will achieve adherence to monitoring guidelines and medication adherence to achieve therapeutic efficacy achieve control of type 2 DM as evidenced by A1c < 7.0 maintain control of HTN, CHF, hyperlipidemia and asthma  as evidenced by attainment of goals listed below  through collaboration with PharmD and provider.   Interventions: 1:1 collaboration with Carollee Herter, Alferd Apa, DO regarding development and  update of comprehensive plan of care as evidenced by provider attestation and co-signature Inter-disciplinary care team collaboration (see longitudinal plan of  care) Comprehensive medication review performed; medication list updated in electronic medical record    Hypertension / CHF:  Controlled; Goal: BP <140/90, minimize symptoms of CHF and prevent CHF exacerbations Current regimen:  Metoprolol tartrate 14m twice a day Furosemide 459meach morning and 4076mach evening Type: Diastolic Last ejection fraction: 60-65% (08/16/2021) - previously on 03/03/17 EF: 65-70% NYHA Class: II (slight limitation of activity) AHA HF Stage: B (Heart disease present - no symptoms present) Patient has failed these meds in past: verapamil Interventions: (addressed at previous visit)  Discussed blood pressure goal Check blood pressure 2 to 3 time per week, document, and provide at future appointments Ensure daily salt intake < 2300 mg/day  Hyperlipidemia / CAD;  LDL at goal; LDL goal < 70 but triglycerides still > goal of 150 Current regimen:  Atorvastatin 63m8mily  Fenofibrate 160mg59mly Interventions: Discussed LDL goal Maintain cholesterol medication regimen - expect triglycerides will improved with improved BG  Diabetes A1c improved but not at goal yet;  A1c goal <7.0 Checks BG: Daily Recent FBG Readings: 130-140's (previously 150-160's) Denie s/sx of hypoglycemia.  Weight prior to starting ozempic = 224lbs Current weight = 204 lbs Total weight loss = 20lbs Current regimen:  Metformin 500mg 82m a day with food Ozempic 0.5mg in13mted once per week Previous medications tried: metformin 1000mg bi7mGI side effects; Victoza daily - stopped after weight loss and improvement in A1c. Approved to received Ozempic [employer] assistance program thru 11/21/2022.  Patient has seen GI recently for dysphagia and diarrhea. He has been referred to [facility]. Mentioned possibly related to diabetes medications.  Interventions: Discussed A1c goals Continue Ozempic 0.5mg SQ w67mly.  Continue metformin ER 500mg dail52meviewed home blood glucose  readings and reviewed goals  Fasting blood glucose goal (before meals) = 80 to 130 Blood glucose goal after a meal = less than 180    History of Pulmonary embolism (blood clot)  Controlled; Goal: prevent recurrence of blood clots and prevent stroke Recent hematuria - continues to occur about once per week. Treated with ciprofloxacin for 10 days with improvement but not resolution of hematuria Current regimen:  Xarelto 20mg daily38mh supper Interventions:  Continue to take Xarelto daily  Will discuss with PCP that patient continues to have hematuria - consider referral to urology and back to hematologist to discuss anticoagulation to prevent recurrent PE Check into [employer] program to see if would lower cost of Xarelto (sent message to MyChart)   Asthma: Controlled; Goal: improve breathing and prevent hospitalizations related to asthma exacerbations Current regimen:  Symbicort 80/4.5mcg - inha65m 2 puffs into lungs once a day (during allergy season) Albuterol inhaler or nebulization solution - use as needed for shortness of breath or wheezing Patient has failed these meds in past: Trelegy  Using maintenance inhaler regularly? Yes during allergy season (Spring and Fall)  Frequency of rescue inhaler use: rarely uses currently but during allergy season 1 or 2 time per week Managed by pulmonology - Dr. Wert InterveMelvyn Novasns: Discussed maintenance versus rescue therapy.  Discussed seasonal triggers  Continue to follow up with Dr Wert  ContinMelvyn Novasbove medication regimen.   Medication management Pharmacist Clinical Goal(s): Over the next 90 days, patient will work with PharmD and providers to maintain optimal medication adherence Patient is taking memantine 10mg - 2 tab2m once a day -  recommended to take 1m 1 tablet twice a day.  [facility] Interventions Comprehensive medication review performed. Continue current medication management  strategy Focus on medication adherence by filling and taking medications appropriately  Take medications as prescribed Report any questions or concerns to PharmD and/or provider(s) Change memantine to take 166m- 1 tablet twice a day  Patient Goals/Self-Care Activities Over the next 90 days, patient will:  take medications as prescribed,  check glucose daily, document, and provide at future appointments,  collaborate with provider on medication access solutions. Continue metformin to ER 50059m take 1 tablet daily with food. Change memantine to take 52m68m1 tablet twice a day Check into [employer] to see if would lower cost of Xarelto (sent message to MyChEmmettFollow Up Plan: Telephone follow up appointment with care management team member scheduled for:  2 to 3  months      Medication Assistance:  Working on [employer] assistance program for OzemCardinal Healthed updated income documentation. Patient aware and will bring into office in the next week.     No medication assistance program for Xarelto for Medicare patients in 2023. Provided information to patient on X[employer] engineergram by MyChEMCORPatient's preferred pharmacy is:  [place] [id number]: [phone]: [phone] ([facility]: [phone]: [phone] Valley Specialty Hospital[facility] ([facility]) - [place] [address] [place]: [phone]: [phone] pill box? Yes Pt endorses 99% compliance  Follow Up:  Patient agrees to Care Plan and Follow-up.  Plan: Telephone follow up appointment with care management team member scheduled for:  2 to 3 months  TammCherre RobinsharmD Clinical Pharmacist Le[facility]landCCaliforniahBaylor Scott [facility]

## 2022-07-31 DIAGNOSIS — Z9889 Other specified postprocedural states: Secondary | ICD-10-CM | POA: Diagnosis not present

## 2022-07-31 DIAGNOSIS — K529 Noninfective gastroenteritis and colitis, unspecified: Secondary | ICD-10-CM | POA: Diagnosis not present

## 2022-07-31 DIAGNOSIS — K449 Diaphragmatic hernia without obstruction or gangrene: Secondary | ICD-10-CM | POA: Diagnosis not present

## 2022-07-31 DIAGNOSIS — R1319 Other dysphagia: Secondary | ICD-10-CM | POA: Diagnosis not present

## 2022-08-02 ENCOUNTER — Other Ambulatory Visit: Payer: Self-pay | Admitting: Family Medicine

## 2022-08-02 DIAGNOSIS — R319 Hematuria, unspecified: Secondary | ICD-10-CM

## 2022-08-02 DIAGNOSIS — I2699 Other pulmonary embolism without acute cor pulmonale: Secondary | ICD-10-CM

## 2022-08-05 ENCOUNTER — Encounter: Payer: Self-pay | Admitting: Family Medicine

## 2022-08-06 ENCOUNTER — Other Ambulatory Visit: Payer: Self-pay | Admitting: Family Medicine

## 2022-08-06 MED ORDER — POTASSIUM CHLORIDE CRYS ER 20 MEQ PO TBCR: 40.0000 meq | EXTENDED_RELEASE_TABLET | Freq: Every day | ORAL | 3 refills | Status: DC

## 2022-08-06 NOTE — Telephone Encounter (Signed)
Is pt supposed to be on potassium? Med no longer on list. Please advise

## 2022-08-07 ENCOUNTER — Inpatient Hospital Stay: Payer: Medicare Other | Attending: Hematology & Oncology

## 2022-08-07 ENCOUNTER — Inpatient Hospital Stay: Payer: Medicare Other | Admitting: Family

## 2022-08-07 ENCOUNTER — Encounter: Payer: Self-pay | Admitting: Family

## 2022-08-07 ENCOUNTER — Other Ambulatory Visit: Payer: Self-pay | Admitting: Family

## 2022-08-07 VITALS — BP 138/80 | HR 96 | Temp 98.1°F | Resp 18 | Ht 69.0 in | Wt 205.8 lb

## 2022-08-07 DIAGNOSIS — D6862 Lupus anticoagulant syndrome: Secondary | ICD-10-CM | POA: Insufficient documentation

## 2022-08-07 DIAGNOSIS — Z85828 Personal history of other malignant neoplasm of skin: Secondary | ICD-10-CM | POA: Diagnosis not present

## 2022-08-07 DIAGNOSIS — Z7901 Long term (current) use of anticoagulants: Secondary | ICD-10-CM | POA: Insufficient documentation

## 2022-08-07 DIAGNOSIS — Z86718 Personal history of other venous thrombosis and embolism: Secondary | ICD-10-CM | POA: Insufficient documentation

## 2022-08-07 DIAGNOSIS — I2699 Other pulmonary embolism without acute cor pulmonale: Secondary | ICD-10-CM | POA: Diagnosis not present

## 2022-08-07 DIAGNOSIS — Z87891 Personal history of nicotine dependence: Secondary | ICD-10-CM | POA: Insufficient documentation

## 2022-08-07 DIAGNOSIS — Z809 Family history of malignant neoplasm, unspecified: Secondary | ICD-10-CM | POA: Insufficient documentation

## 2022-08-07 DIAGNOSIS — E1142 Type 2 diabetes mellitus with diabetic polyneuropathy: Secondary | ICD-10-CM | POA: Insufficient documentation

## 2022-08-07 DIAGNOSIS — Z86711 Personal history of pulmonary embolism: Secondary | ICD-10-CM | POA: Diagnosis not present

## 2022-08-07 DIAGNOSIS — I509 Heart failure, unspecified: Secondary | ICD-10-CM | POA: Diagnosis not present

## 2022-08-07 DIAGNOSIS — I110 Hypertensive heart disease with heart failure: Secondary | ICD-10-CM | POA: Diagnosis not present

## 2022-08-07 LAB — CBC WITH DIFFERENTIAL (CANCER CENTER ONLY)
Abs Immature Granulocytes: 0.04 10*3/uL (ref 0.00–0.07)
Basophils Absolute: 0 10*3/uL (ref 0.0–0.1)
Basophils Relative: 0 %
Eosinophils Absolute: 0.1 10*3/uL (ref 0.0–0.5)
Eosinophils Relative: 1 %
HCT: 36.8 % — ABNORMAL LOW (ref 39.0–52.0)
Hemoglobin: 11.8 g/dL — ABNORMAL LOW (ref 13.0–17.0)
Immature Granulocytes: 0 %
Lymphocytes Relative: 12 %
Lymphs Abs: 1.5 10*3/uL (ref 0.7–4.0)
MCH: 29.6 pg (ref 26.0–34.0)
MCHC: 32.1 g/dL (ref 30.0–36.0)
MCV: 92.5 fL (ref 80.0–100.0)
Monocytes Absolute: 0.8 10*3/uL (ref 0.1–1.0)
Monocytes Relative: 6 %
Neutro Abs: 9.8 10*3/uL — ABNORMAL HIGH (ref 1.7–7.7)
Neutrophils Relative %: 81 %
Platelet Count: 287 10*3/uL (ref 150–400)
RBC: 3.98 MIL/uL — ABNORMAL LOW (ref 4.22–5.81)
RDW: 15.2 % (ref 11.5–15.5)
WBC Count: 12.2 10*3/uL — ABNORMAL HIGH (ref 4.0–10.5)
nRBC: 0 % (ref 0.0–0.2)

## 2022-08-07 LAB — CMP (CANCER CENTER ONLY)
ALT: 30 U/L (ref 0–44)
AST: 19 U/L (ref 15–41)
Albumin: 4.3 g/dL (ref 3.5–5.0)
Alkaline Phosphatase: 47 U/L (ref 38–126)
Anion gap: 12 (ref 5–15)
BUN: 16 mg/dL (ref 8–23)
CO2: 24 mmol/L (ref 22–32)
Calcium: 10.4 mg/dL — ABNORMAL HIGH (ref 8.9–10.3)
Chloride: 103 mmol/L (ref 98–111)
Creatinine: 1.41 mg/dL — ABNORMAL HIGH (ref 0.61–1.24)
GFR, Estimated: 53 mL/min — ABNORMAL LOW (ref >60)
Glucose, Bld: 220 mg/dL — ABNORMAL HIGH (ref 70–99)
Potassium: 3 mmol/L — ABNORMAL LOW (ref 3.5–5.1)
Sodium: 139 mmol/L (ref 135–145)
Total Bilirubin: 0.5 mg/dL (ref 0.3–1.2)
Total Protein: 7.5 g/dL (ref 6.5–8.1)

## 2022-08-07 LAB — ANTITHROMBIN III: AntiThromb III Func: 87 % (ref 75–120)

## 2022-08-07 LAB — LACTATE DEHYDROGENASE: LDH: 152 U/L (ref 98–192)
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
## 2022-08-08 ENCOUNTER — Telehealth: Payer: Self-pay | Admitting: *Deleted

## 2022-08-08 LAB — CARDIOLIPIN ANTIBODIES, IGG, IGM, IGA
Anticardiolipin IgA: <9 APL U/mL (ref 0–11)
Anticardiolipin IgG: <9 GPL U/mL (ref 0–14)
Anticardiolipin IgM: 10 MPL U/mL (ref 0–12)

## 2022-08-08 LAB — HOMOCYSTEINE: Homocysteine: 10 umol/L (ref 0.0–19.2)

## 2022-08-08 NOTE — Telephone Encounter (Signed)
Per 08/07/22 los -  : No follow-up needed.

## 2022-08-09 LAB — BETA-2-GLYCOPROTEIN I ABS, IGG/M/A
Beta-2 Glyco I IgG: <9 GPI IgG units (ref 0–20)
Beta-2-Glycoprotein I IgA: <9 GPI IgA units (ref 0–25)
Beta-2-Glycoprotein I IgM: 12 GPI IgM units (ref 0–32)

## 2022-08-10 ENCOUNTER — Other Ambulatory Visit: Payer: Self-pay | Admitting: Family Medicine

## 2022-08-10 LAB — PROTEIN C, TOTAL: Protein C, Total: 93 % (ref 60–150)

## 2022-08-13 DIAGNOSIS — Z129 Encounter for screening for malignant neoplasm, site unspecified: Secondary | ICD-10-CM | POA: Diagnosis not present

## 2022-08-13 DIAGNOSIS — L570 Actinic keratosis: Secondary | ICD-10-CM | POA: Diagnosis not present

## 2022-08-13 DIAGNOSIS — Z85828 Personal history of other malignant neoplasm of skin: Secondary | ICD-10-CM | POA: Diagnosis not present

## 2022-08-13 DIAGNOSIS — L821 Other seborrheic keratosis: Secondary | ICD-10-CM | POA: Diagnosis not present

## 2022-08-13 DIAGNOSIS — W908XXS Exposure to other nonionizing radiation, sequela: Secondary | ICD-10-CM | POA: Diagnosis not present

## 2022-08-13 DIAGNOSIS — X32XXXS Exposure to sunlight, sequela: Secondary | ICD-10-CM | POA: Diagnosis not present

## 2022-08-13 DIAGNOSIS — L814 Other melanin hyperpigmentation: Secondary | ICD-10-CM | POA: Diagnosis not present

## 2022-08-13 DIAGNOSIS — D1801 Hemangioma of skin and subcutaneous tissue: Secondary | ICD-10-CM | POA: Diagnosis not present

## 2022-08-13 DIAGNOSIS — L578 Other skin changes due to chronic exposure to nonionizing radiation: Secondary | ICD-10-CM | POA: Diagnosis not present

## 2022-08-13 LAB — FACTOR 5 LEIDEN

## 2022-08-14 ENCOUNTER — Ambulatory Visit: Payer: Medicare Other | Admitting: Pharmacist

## 2022-08-14 DIAGNOSIS — E1165 Type 2 diabetes mellitus with hyperglycemia: Secondary | ICD-10-CM

## 2022-08-14 DIAGNOSIS — I2699 Other pulmonary embolism without acute cor pulmonale: Secondary | ICD-10-CM

## 2022-08-14 LAB — PROTHROMBIN GENE MUTATION

## 2022-08-14 NOTE — Chronic Care Management (AMB) (Signed)
Chronic Care Management Pharmacy Note  08/14/2022 Name:  Garrett [name] MRN:  [id number] DOB:  1951-09-07  Summary:  Patient is having upper and lower GI symptoms. He met with GI at [facility] who felt Ozempic was possible reason for diarrhea and early satiety. Patient has been off Ozempic for 3 doses and also started fiber supplement. Reports diarrhea better. Blood glucose has been in 150's.He would like to restart Ozempic. Discussed that GI symptoms and diarrhea might return if he restarts Ozempic. He would like to retry. Recommended restart Ozempic at 0.25mg  weekly. Monitor for diarrhea and GI symptoms. Contact office if your experience any changes. . History of PE: Seen in last 2 months for hematuria. Reports hematuria improved a little with a course of ciprofloxacin but he is still. He saw Hemetology last [name] and approved lowering dose of Xarelto to 10mg  daily. Mailed patient application for Xarelto medication assistance program.   Subjective: Garrett [name] is an 71 y.o. year old male who is a primary patient of Ann Held, DO.  The CCM team was consulted for assistance with disease management and care coordination needs.    Engaged with patient by telephone for follow up visit in response to provider referral for pharmacy case management and/or care coordination services.   Consent to Services:  The patient was given information about Chronic Care Management services, agreed to services, and gave verbal consent prior to initiation of services.  Please see initial visit note for detailed documentation.   Patient Care Team: Carollee Herter, Alferd Apa, DO as PCP - General (Family Medicine) Milus Banister, MD (Gastroenterology) [name], MD (Neurosurgery) Tanda Rockers, MD as Consulting Physician (Pulmonary Disease) Cameron Sprang, MD as Consulting Physician (Neurology) [name], MD as Consulting Physician (Neurosurgery) Marlaine Hind, MD as Consulting Physician  (Physical Medicine and Rehabilitation) Cherre Robins, [place] (Pharmacist) Ralene Ok, MD as Consulting Physician (General Surgery) Gardiner Barefoot, DPM as Consulting Physician (Podiatry) Antionette Fairy, Isaias Cowman, MD as Referring Physician (Ophthalmology)  Recent office visits: 07/10/2022 - Fam Med ([place]) Acute visit for hematuria. Labs ordered. UA was clear - culture pending. Given Rx for ciprofloxacine 500mg  twice a day. 06/07/2022 - Fam Med (Dr Carollee Herter) F/U chronic conditions. Patient mentions he does not think gabapentin is helping and would like to discontinue. Decreased dose of gabapentin to 100mg  daily. Ordered PT;  02/21/2022 - Fam Med (Dr Carollee Herter) F/U hospitalization for pneumonia. Prescribed albuterol neb solution to use up to every 6 hours as needed. Refilled gabapentin  Recent consult visits: 08/07/2022 - Hematology Eulas Post, NP) History of PE with positive lupus anticoagulant. Reduced Xarelto to 10mg  daily 07/31/2022 - GI (Dr Stana Bunting [facility]) Seen for early satiety / dysphagia / diarrhea. Recommended hold Ozempic. Regarding his dysphagia, Planned to review his prior history with esophageal group to see if additional evaluation with Virgel Gess is helpful here. Dr Stana Bunting suspected that he likely has some form of dysmotility in the body of the esophagus that is more evident now following his fundoplication. Did not think additional surgery or EGD w/ empiric dilation would be beneficial. Diarrhea - stop artificial sweeteners and minimize dairy intake. Recommended celiac testing. Add fiber supplement.  07/26/2022 - Podiatry (Dr Prudence Davidson) Seen for onychomycosis of toenails of both feet. Ordered diabetic shoes.  06/14/2022 - GI (Dr [name]) Seen for diarrhea. Ordered stool profile. Timing of onset shortly hafter he started mag oxide and double his metformin.  He decreased the metforming back to  previous dose and noticed significant improvement in his  diarrhea. 06/05/2022 - Neuro (Dr Delice Lesch) F/U weakness in legs and hand. Labs checked  Hospital visits: 02/17/2022 - to 02/19/2022 [facility] at [facility] for pneumonia and asthma exacerbation.. New medications: cefdinir 300mg  twice a day for 4 more days; Mucinex DM every 12 hours for 10 days; prednisone 20mg  daily for 3 days.  02/17/2022 - Urgent Care at [facility] seen for cough adn SOB. Advised to go to ED / hospital.     Objective:  Lab Results  Component Value Date   CREATININE 1.41 (H) 08/07/2022   CREATININE 1.34 07/10/2022   CREATININE 1.16 11/12/2021    Lab Results  Component Value Date   HGBA1C 7.0 (H) 07/10/2022   Last diabetic Eye exam:  Lab Results  Component Value Date/Time   HMDIABEYEEXA No Retinopathy 12/21/2018 12:34 PM    Last diabetic Foot exam: No results found for: "HMDIABFOOTEX"      Component Value Date/Time   CHOL 135 11/12/2021 1114   CHOL 162 07/18/2015 0744   TRIG 200.0 (H) 11/12/2021 1114   TRIG 110 07/18/2015 0744   HDL 32.00 (L) 11/12/2021 1114   HDL 41 07/18/2015 0744   CHOLHDL 4 11/12/2021 1114   VLDL 40.0 11/12/2021 1114   LDLCALC 63 11/12/2021 1114   LDLCALC 129 (H) 08/17/2020 0948   LDLCALC 99 07/18/2015 0744   LDLDIRECT 104.0 02/14/2020 1025       Latest Ref Rng & Units 08/07/2022    3:09 PM 07/10/2022    4:11 PM 06/07/2022   10:13 AM  Hepatic Function  Total Protein 6.5 - 8.1 g/dL 7.5  6.6  7.5   Albumin 3.5 - 5.0 g/dL 4.3  4.1    AST 15 - 41 U/L 19  27    ALT 0 - 44 U/L 30  36    Alk Phosphatase 38 - 126 U/L 47  44    Total Bilirubin 0.3 - 1.2 mg/dL 0.5  0.4      Lab Results  Component Value Date/Time   TSH 2.75 06/07/2022 10:13 AM   TSH 1.30 04/28/2018 10:16 AM       Latest Ref Rng & Units 08/07/2022    3:09 PM 07/10/2022    4:11 PM 04/19/2022    4:12 PM  CBC  WBC 4.0 - 10.5 K/uL 12.2  10.1  6.6   Hemoglobin 13.0 - 17.0 g/dL 11.8  12.0  12.4   Hematocrit 39.0 - 52.0 % 36.8  36.5   37.5   Platelets 150 - 400 K/uL 287  279.0  286     No results found for: "VD25OH"  Clinical ASCVD: Yes  The 10-year ASCVD risk score (Arnett DK, et al., 2019) is: 45.3%   Values used to calculate the score:     Age: 26 years     Sex: Male     Is Non-Hispanic African American: No     Diabetic: Yes     Tobacco smoker: No     Systolic Blood Pressure: 962 mmHg     Is BP treated: Yes     HDL Cholesterol: 32 mg/dL     Total Cholesterol: 135 mg/dL      Social History   Tobacco Use  Smoking Status Former   Packs/day: 3.00   Years: 30.00   Total pack years: 90.00   Types: Cigarettes   Quit date: 01/09/1991   Years since quitting: 31.6  Smokeless Tobacco Former  Types: Snuff   BP Readings from Last 3 Encounters:  08/07/22 138/80  07/10/22 108/68  07/04/22 127/84   Pulse Readings from Last 3 Encounters:  08/07/22 96  07/10/22 86  07/04/22 67   Wt Readings from Last 3 Encounters:  08/07/22 205 lb 12.8 oz (93.4 kg)  07/10/22 205 lb 9.6 oz (93.3 kg)  07/04/22 204 lb (92.5 kg)    Assessment: Review of patient past medical history, allergies, medications, health status, including review of consultants reports, laboratory and other test data, was performed as part of comprehensive evaluation and provision of chronic care management services.   SDOH:  (Social Determinants of Health) assessments and interventions performed:       CCM Care Plan  Allergies  Allergen Reactions   Bee Venom Anaphylaxis   Garlic Swelling    Medications Reviewed Today     Reviewed by Cherre Robins, RPH-CPP (Pharmacist) on 08/14/22 at 1217  Med List Status: <None>   Medication Order Taking? Sig Documenting Provider Last Dose Status Informant  albuterol (PROVENTIL) (2.5 MG/3ML) 0.083% nebulizer solution [id number]  Take 3 mLs (2.5 mg total) by nebulization every 6 (six) hours as needed for wheezing or shortness of breath. Ann Held, DO  Active   allopurinol (ZYLOPRIM) 100 MG  tablet [id number]  Take 1 tablet (100 mg total) by mouth daily. Carollee Herter, Alferd Apa, DO  Active   atorvastatin (LIPITOR) 80 MG tablet [id number]  Take 80 mg by mouth daily. [provider]  Active   celecoxib (CELEBREX) 200 MG capsule [id number]  TAKE 1 CAPSULE BY MOUTH  TWICE DAILY  Patient taking differently: 2 caps in AM   Carollee Herter, Yvonne R, DO  Active   EPINEPHrine 0.3 mg/0.3 mL IJ SOAJ injection [id number]  Inject 0.3 mg into the muscle as needed for anaphylaxis.  [provider]  Active Self  famotidine (PEPCID) 20 MG tablet [id number]  Take 1 tablet (20 mg total) by mouth at bedtime.  Patient taking differently: Take 20 mg by mouth 2 (two) times daily. $RemoveBefo'40mg'GWOjkQxnPqt$  in the am   Milus Banister, MD  Active Self  fenofibrate 160 MG tablet [id number]  Take by mouth. [provider]  Active   Ferrous Sulfate (IRON PO) [id number]  Take 1 tablet by mouth daily. [provider]  Active Self  fluticasone (FLONASE) 50 MCG/ACT nasal spray [id number]  Place 2 sprays into both nostrils daily. Ann Held, DO  Active Self  furosemide (LASIX) 40 MG tablet [id number]  Take 40 mg by mouth 2 (two) times daily. $RemoveBefo'40mg'qQRdhJOwzuv$  in the morning  $Remove'40mg'WCZygfP$  in evening [provider]  Active Self  gabapentin (NEURONTIN) 100 MG capsule [id number]  Take 2 capsules (200 mg total) by mouth at bedtime.  Patient taking differently: Take 100 mg by mouth at bedtime.   Carollee Herter, Alferd Apa, DO  Active   levocetirizine (XYZAL) 5 MG tablet [id number]  TAKE 1 TABLET BY MOUTH IN THE  EVENING Lowne Chase, Alferd Apa, DO  Active   meclizine (ANTIVERT) 25 MG tablet [id number]  Take 1 tablet (25 mg total) by mouth 3 (three) times daily as needed for dizziness. Ann Held, DO  Active Self  memantine (NAMENDA) 10 MG tablet [id number]  Take 10 mg by mouth 2 (two) times daily. [provider]  Active   metFORMIN (GLUCOPHAGE-XR) 500 MG 24 hr tablet [id number]  Take 1 tablet (500 mg total) by  mouth daily with breakfast. Carollee Herter, Alferd Apa,  DO  Active   metoprolol tartrate (LOPRESSOR) 50 MG tablet [id number]  Take 50 mg by mouth 2 (two) times daily. [provider]  Active Self  Multiple Vitamins-Minerals (ONE-A-DAY WEIGHT SMART ADVANCE PO) [id number]  Take 1 tablet by mouth daily. Centrum Silver [provider]  Active Self  nitroGLYCERIN (NITROSTAT) 0.4 MG SL tablet [id number]  Place 0.4 mg under the tongue every 5 (five) minutes as needed for chest pain. [provider]  Active Self  omeprazole (PRILOSEC) 40 MG capsule [id number]  Take 1 capsule by mouth daily. [provider]  Active   Donald Siva test strip [id number]  USE AS DIRECTED 3 TIMES  DAILY Carollee Herter, Alferd Apa, DO  Active   potassium chloride SA (KLOR-CON M) 20 MEQ tablet [id number]  Take 2 tablets (40 mEq total) by mouth daily. Carollee Herter, Yvonne R, DO  Active   PROAIR HFA 108 [phone] Base) MCG/ACT inhaler [id number]  Inhale 1 puff into the lungs every 6 (six) hours as needed for wheezing or shortness of breath. Ann Held, DO  Active Self  rivaroxaban (XARELTO) 10 MG TABS tablet [id number] Yes Take 10 mg by mouth daily. [provider] Taking Active   Semaglutide (OZEMPIC, 0.25 OR 0.5 MG/DOSE, ) [id number] Yes Inject 0.25 mg into the skin once a [name]. [provider] Taking Active   SYMBICORT 80-4.5 MCG/ACT inhaler [id number]  USE 2 INHALATIONS BY MOUTH DAILY Carollee Herter, Alferd Apa, DO  Active   tadalafil (CIALIS) 20 MG tablet [id number]  Take 0.5-1 tablets (10-20 mg total) by mouth every other day as needed for erectile dysfunction.  Patient not taking: Reported on 07/30/2022   Ann Held, DO  Active Self  topiramate (TOPAMAX) 50 MG tablet [id number]  TAKE 1 TABLET BY MOUTH  TWICE DAILY Cameron Sprang, MD  Active   vitamin B-12 (CYANOCOBALAMIN) 1000 MCG tablet [id number]  Take 1,000 mcg by mouth daily. [provider]  Active Self             Patient Active Problem List   Diagnosis Date Noted   Uncontrolled type 2 diabetes mellitus with hyperglycemia ([place]) 06/07/2022   Hematuria 05/03/2022   Urinary tract infection with hematuria 04/19/2022   Weakness of both lower extremities 02/25/2022   Frequent falls 02/25/2022   Pneumonia due to infectious organism 02/21/2022   Coronary artery disease involving native coronary artery of native heart without angina pectoris 02/17/2022   Acute recurrent ethmoidal sinusitis 37/09/6268   Metabolic acidosis, normal anion gap (NAG) 02/17/2022   Sensorineural hearing loss (SNHL) of both ears 02/17/2022   Severe sepsis ([place]) 08/14/2021   Pneumonia 08/14/2021   Lactic acidosis 08/14/2021   Hypokalemia 08/14/2021   DOE (dyspnea on exertion) 05/11/2021   Abnormal stress test 04/20/2021   Acute respiratory distress    Acute respiratory failure with hypoxia (HCC)    S/P Nissen fundoplication (without gastrostomy tube) procedure 02/20/2021   Body mass index (BMI) 33.0-33.9, adult 11/03/2019   Laceration of right hand 08/02/2019   Acute pulmonary embolism ([place]) 01/07/2019   Chronic diastolic CHF (congestive heart failure) ([place]) 01/07/2019   Preventative health care 02/10/2018   Dizziness 02/10/2018   Spondylolisthesis at L3-L4 level 10/28/2017   Cough variant asthma  vs uacs/ pseudoasthma 06/17/2017   Pneumonia of right middle lobe due to infectious organism 02/28/2017   Sepsis ([place]) 02/28/2017   Pulmonary embolism and infarction ([place]) 02/09/2017   Bilateral pulmonary embolism ([place]) 02/04/2017   Greater trochanteric  bursitis of left hip 01/14/2017   Greater trochanteric bursitis of right hip 12/20/2016   Degenerative arthritis of knee, bilateral 10/08/2016   Upper airway cough syndrome 03/22/2016   Multiple pulmonary nodules 03/22/2016   Acute bronchitis 01/22/2016   Headache disorder 01/04/2016   Acute upper respiratory infection 10/25/2015   Elevated CK 07/18/2015   History of  colonic polyps 04/11/2015   Lumbar stenosis with neurogenic claudication 11/14/2014   Spondylolysis of cervical region 02/25/2014   Lumbosacral spondylosis without myelopathy 01/06/2014   OSA (obstructive sleep apnea) 12/08/2013   SOB (shortness of breath) on exertion 11/04/2013   Morbid obesity due to excess calories ([place])    Cervicalgia    Venous insufficiency of leg 02/18/2012   Itching 02/18/2012   Mild dementia ([place]) 05/28/2011   Hyperlipidemia associated with type 2 diabetes mellitus ([place]) 05/27/2011   Controlled type 2 diabetes mellitus with diabetic nephropathy ([place]) 04/10/2011   Gout 04/10/2011   Essential hypertension 04/10/2011   OA (osteoarthritis) 04/10/2011   GERD (gastroesophageal reflux disease) 04/10/2011   Migraine headache without aura 04/10/2011   Allergic rhinitis, cause unspecified 04/10/2011   Bee sting allergy 04/10/2011   Generalized osteoarthritis of multiple sites 04/10/2011   Hypertension associated with stage 2 chronic kidney disease due to type 2 diabetes mellitus ([place]) 04/10/2011    Immunization History  Administered Date(s) Administered   Fluad Quad(high Dose 65+) 10/20/2018, 10/07/2019, 10/19/2020, 08/30/2021   Influenza Split 09/30/2012   Influenza, High Dose Seasonal PF 09/19/2016, 10/07/2017, 10/16/2018   Influenza,inj,Quad PF,6+ Mos 09/15/2013, 09/26/2014, 09/22/2015   Influenza,inj,quad, With Preservative 09/15/2013, 09/26/2014, 09/22/2015   PFIZER(Purple Top)SARS-COV-2 Vaccination 02/12/2020, 03/07/2020, 08/19/2020   Pneumococcal Conjugate-13 07/18/2015   Pneumococcal Polysaccharide-23 04/10/2011, 09/19/2016   Tdap 04/10/2011, 04/09/2019, 07/22/2019   Zoster Recombinat (Shingrix) 07/04/2022   Zoster, Live 03/28/2014    Conditions to be addressed/monitored: CHF, CAD, HTN, HLD, Hypertriglyceridemia, DMII, and Asthma, neuropathy, migraine headaches, GERD, gout, mild anemia, OSA; recurrent PE with chronic anticoagulation  Care Plan :  General Pharmacy (Adult)  Updates made by Cherre Robins, RPH-CPP since 08/14/2022 12:00 AM     Problem: asthma, hyperlipidemia; type 2 DM; CAD; CHF; HTN; recurrent PE; chronic anticoagulation; migraines; neuropathy; gout; GERD; mild dementia; vertigo; OSA; seasonal allergies   Priority: High  Onset Date: 08/07/2021  Note:   Current Barriers:  Unable to maintain control of type 2 DM Does not adhere to prescribed medication regimen Education needed regarding medication regimen and chronic conditions  Pharmacist Clinical Goal(s):  Over the next 90 days, patient will achieve adherence to monitoring guidelines and medication adherence to achieve therapeutic efficacy achieve control of type 2 DM as evidenced by A1c < 7.0 maintain control of HTN, CHF, hyperlipidemia and asthma  as evidenced by attainment of goals listed below  through collaboration with PharmD and provider.   Interventions: 1:1 collaboration with Carollee Herter, Alferd Apa, DO regarding development and update of comprehensive plan of care as evidenced by provider attestation and co-signature Inter-disciplinary care team collaboration (see longitudinal plan of care) Comprehensive medication review performed; medication list updated in electronic medical record    Hypertension / CHF:  Controlled; Goal: BP <140/90, minimize symptoms of CHF and prevent CHF exacerbations Current regimen:  Metoprolol tartrate 50mg  twice a day Furosemide 40mg  each morning and 40mg  each evening Type: Diastolic Last ejection fraction: 60-65% (08/16/2021) - previously on 03/03/17 EF: 65-70% NYHA Class: II (slight limitation of activity) AHA HF Stage: B (Heart disease present - no symptoms present) Patient has failed these  meds in past: verapamil Interventions: (addressed at previous visit)  Discussed blood pressure goal Check blood pressure 2 to 3 time per [name], document, and provide at future appointments Ensure daily salt intake < 2300  mg/day  Hyperlipidemia / CAD;  LDL at goal; LDL goal < 70 but triglycerides still > goal of 150 Current regimen:  Atorvastatin $RemoveBefor'80mg'lLHVMSdLOIOB$  daily  Fenofibrate $RemoveBefo'160mg'UnUnAmQhbPW$  daily Interventions: Discussed LDL goal Maintain cholesterol medication regimen - expect triglycerides will improved with improved BG  Diabetes A1c improved but not at goal yet;  A1c goal <7.0 Checks BG: Daily Recent FBG Readings: 130-140's (previously 150-160's) Denie s/sx of hypoglycemia.  Weight prior to starting ozempic = 224lbs Current weight = 204 lbs Total weight loss = 20lbs Current regimen:  Metformin $RemoveBe'500mg'qWDhDjQbe$  once a day with food Ozempic 0.$RemoveBefore'5mg'WqAkJVDfMBfZx$  injected once per [name] (holding)  Previous medications tried: metformin $RemoveBeforeDE'1000mg'qtrMQzzbZaPcsss$  bid - GI side effects; Victoza daily - stopped after weight loss and improvement in A1c. Approved to received [employer] patient assistance program thru 11/21/2022.  Patient has seen GI recently for dysphagia and diarrhea. Has been holding Ozempic for last 3 weeks.  Interventions: Discussed A1c goals Retry Ozempic at 0.$RemoveBefo'25mg'kfGXDQtbDuM$  weekly - monitor for diarrhea or GI discomfort.  Continue metformin ER $RemoveBefo'500mg'CdzJbYWSOMr$  daily  Reviewed home blood glucose readings and reviewed goals  Fasting blood glucose goal (before meals) = 80 to 130 Blood glucose goal after a meal = less than 180    History of Pulmonary embolism (blood clot)  Controlled; Goal: prevent recurrence of blood clots and prevent stroke First PE that diagnosed in April 2018 with positive lupus anticoagulant.  Patient was treated with Xarelto for 1 year.  In November 2019 he was changed from Xarelto to Aspirin 325 mg. 12/2018 had recurrent PE. Xarleto $RemoveBef'20mg'uGpMCkvaLF$  daily was restarted.  Recent hematuria - continues to occur about once per [name]. Treated with ciprofloxacin for 10 days with improvement but not resolution of hematuria Current regimen:  Xarelto $Remove'10mg'yQYxbJv$  daily with supper (dose of lowered 08/16/203 by hematology) Interventions:  Continue to take  Xarelto daily  Provided Wynetta Emery and [employer] application for medication assistance program for [employer].  Asthma: Controlled; Goal: improve breathing and prevent hospitalizations related to asthma exacerbations Current regimen:  Symbicort 80/4.79mcg - inhaler 2 puffs into lungs once a day (during allergy season) Albuterol inhaler or nebulization solution - use as needed for shortness of breath or wheezing Patient has failed these meds in past: Trelegy  Using maintenance inhaler regularly? Yes during allergy season (Spring and Fall)  Frequency of rescue inhaler use: rarely uses currently but during allergy season 1 or 2 time per [name] Managed by pulmonology - Dr. Melvyn Novas Interventions: Discussed maintenance versus rescue therapy.  Discussed seasonal triggers  Continue to follow up with Dr Melvyn Novas  Continue above medication regimen.   Medication management Pharmacist Clinical Goal(s): Over the next 90 days, patient will work with PharmD and providers to maintain optimal medication adherence Patient is taking memantine $RemoveBeforeDE'10mg'KYGUPzdAHjtrrgC$  - 2 tablets once a day - recommended to take $Remov'10mg'qdPkpg$  1 tablet twice a day.  Current pharmacy: [facility] Interventions Comprehensive medication review performed. Continue current medication management strategy Focus on medication adherence by filling and taking medications appropriately  Take medications as prescribed Report any questions or concerns to PharmD and/or provider(s)  Patient Goals/Self-Care Activities Over the next 90 days, patient will:  take medications as prescribed,  check glucose daily, document, and provide at future appointments,  collaborate with provider on medication  access solutions. Continue metformin to ER $Rem'500mg'HBMQ$  - take 1 tablet daily with food. Complete medication assistance program application for Xarelto and return to our office.  Restart Ozempic at 0.$RemoveBefo'25mg'rUkikfHhpJW$  weekly - monitor for GI discomfort and diarrhea - stop if these symptoms  return.   Follow Up Plan: Telephone follow up appointment with care management team member scheduled for:  2 to 3  weeks       Medication Assistance:  Working on [employer] patient assistance program for [employer]. Need updated income documentation. Patient aware and will bring into office in the next [name].     No medication assistance program for Xarelto for Medicare patients in 2023. Provided information to patient on Network [employer] program by [employer].   Patient's preferred pharmacy is:  [place],  - [address] [place] [address] Phone: [phone] Fax: [phone]  [facility] ([place], [place] [facility] [address] [place] [address] [place] [address] Phone: [phone] Fax: [phone]  [facility] ([facility]) - [facility] [place] [address] Phone: [phone] Fax: [phone]   Uses pill box? Yes Pt endorses 99% compliance  Follow Up:  Patient agrees to Care Plan and Follow-up.  Plan: Telephone follow up appointment with care management team member scheduled for:  2 to 3 weeks  Cherre Robins, PharmD Clinical Pharmacist [facility]

## 2022-08-14 NOTE — Patient Instructions (Signed)
Garrett [name] It was a pleasure speaking with you today.  Below is a summary of your health goals and summary of our recent visit. You can also view your updated Chronic Care Management Care plan through your MyChart account.    As always if you have any questions or concerns especially regarding medications, please feel free to contact me either at the phone number below or with a MyChart message.   Keep up the good work!  Cherre Robins, PharmD Clinical Pharmacist [facility] [phone] (direct line)  [phone] (main office number)  Chronic Care Management Care Plan  Hypertension BP Readings from Last 3 Encounters:  08/07/22 138/80  07/10/22 108/68  07/04/22 127/84   Pharmacist Clinical Goal(s): Over the next 90 days, patient will work with PharmD and providers to maintain BP goal <140/90 Current regimen:  Metoprolol tartrate '50mg'$  twice a day Interventions: Discussed blood pressure goal Updated medication list to reflect increase in metoprolol after last visit with cardiology  Patient self care activities - Over the next 90 days, patient will: Check blood pressure 2 to 3 time per week, document, and provide at future appointments Ensure daily salt intake < 2300 mg/day  Hyperlipidemia Lab Results  Component Value Date/Time   LDLCALC 63 11/12/2021 11:14 AM   LDLCALC 129 (H) 08/17/2020 09:48 AM   LDLCALC 99 07/18/2015 07:44 AM   LDLDIRECT 104.0 02/14/2020 10:25 AM   Pharmacist Clinical Goal(s): Over the next 90 days, patient will work with PharmD and providers to maintain LDL goal < 70 Current regimen:  Atorvastatin '80mg'$  daily  Fenofibrate '160mg'$  daily Interventions: Discussed LDL goal Patient self care activities - Over the next 90 days, patient will: Maintain cholesterol medication regimen. Great job - your LDL is now at goal.   Diabetes Lab Results  Component Value Date/Time   HGBA1C 7.0 (H) 07/10/2022 04:11 PM   HGBA1C 7.2  (H) 11/12/2021 11:14 AM   A1 wa 6.5% 01/2022 at [place] Goal(s): Over the next 90 days, patient will work with PharmD and providers to achieve A1c goal <7% Current regimen:  Metformin '500mg'$  once a day Ozampic 0.'5mg'$  once per week (holding)  Interventions: Discussed J6E goals 8315 application for Ozempic patient assistance program still in process Reviewed home blood glucose readings and reviewed goals  Fasting blood glucose goal (before meals) = 80 to 130 Blood glucose goal after a meal = less than 180  Patient self care activities - Over the next 90 days, patient will: Check blood sugar once daily, document, and provide at future appointments Retry Ozempic at lower dose of 0.'25mg'$  inject subcutaneously once a week - monitor for diarrhea and GI discomfort Continue metformin ER '500mg'$  take 1 tablet ONCE a day with food Contact provider with any episodes of hypoglycemia  History of Pulmonary embolism (blood clot)  Pharmacist Clinical Goal(s) Over the next 90 days, patient will work with PharmD and providers to prevent recurrence of blood clots and prevent stroke Current regimen:  Xarelto '10mg'$  daily with supper Patient self care activities - Over the next 90 days, patient will: Continue to take Xarelto daily Complete application for medication assistance program for Xarelto and return to office.   Asthma: Pharmacist Clinical Goal(s) Over the next 90 days, patient will work with PharmD and providers to improve breathing and prevent hospitalizations related to asthma exacerbations Current regimen:  Symbicort 80/4.30mg - inhaler 2 puffs into lungs once a day Albuterol inhaler or nebulization solution -  use as needed for shortness of breath or wheezing Interventions: Discussed maintenance versus rescue therapy.  Discussed seasonal as the triggers  Patient self care activities - Over the next 90 days, patient will: Continue to follow up with Dr Melvyn Novas   Continue above medication regimen.   Medication management Pharmacist Clinical Goal(s): Over the next 90 days, patient will work with PharmD and providers to maintain optimal medication adherence Current pharmacy: [facility] Interventions Comprehensive medication review performed. Performed review of medications from recent hospitalization and discharge summary.  Continue current medication management strategy Patient self care activities - Over the next 90 days, patient will: Focus on medication adherence by filling and taking medications appropriately  Take medications as prescribed Report any questions or concerns to PharmD and/or provider(s) Change memantine to take '10mg'$  - 1 tablet twice a day  Patient Goals/Self-Care Activities Over the next 90 days, patient will:  take medications as prescribed,  check glucose daily, document, and provide at future appointments,  collaborate with provider on medication access solutions. Continue metformin to ER '500mg'$  - take 1 tablet daily with food. Complete medication assistance program application for Xarelto and return to our office.  Restart Ozempic at 0.'25mg'$  weekly - monitor for GI discomfort and diarrhea - stop if these symptoms return.   Follow Up Plan: Telephone follow up appointment with care management team member scheduled for:  2 to 3  weeks  Patient verbalizes understanding of instructions and care plan provided today and agrees to view in [place]. Active MyChart status and patient understanding of how to access instructions and care plan via MyChart confirmed with patient.

## 2022-08-15 LAB — DRVVT MIX: dRVVT Mix: 58.6 s — ABNORMAL HIGH (ref 0.0–40.4)

## 2022-08-15 LAB — LUPUS ANTICOAGULANT PANEL
DRVVT: 87.7 s — ABNORMAL HIGH (ref 0.0–47.0)
PTT Lupus Anticoagulant: 62 s — ABNORMAL HIGH (ref 0.0–43.5)

## 2022-08-15 LAB — PROTEIN S, TOTAL: Protein S Ag, Total: 110 % (ref 60–150)

## 2022-08-15 LAB — DRVVT CONFIRM: dRVVT Confirm: 1.5 ratio — ABNORMAL HIGH (ref 0.8–1.2)

## 2022-08-15 LAB — PROTEIN S ACTIVITY: Protein S Activity: 84 % (ref 63–140)

## 2022-08-15 LAB — PROTEIN C ACTIVITY: Protein C Activity: 125 % (ref 73–180)

## 2022-08-15 LAB — HEXAGONAL PHASE PHOSPHOLIPID: Hexagonal Phase Phospholipid: 8 s (ref 0–11)

## 2022-08-15 LAB — PTT-LA MIX: PTT-LA Mix: 58.1 s — ABNORMAL HIGH (ref 0.0–40.5)

## 2022-08-17 DIAGNOSIS — M1990 Unspecified osteoarthritis, unspecified site: Secondary | ICD-10-CM | POA: Diagnosis not present

## 2022-08-17 DIAGNOSIS — I509 Heart failure, unspecified: Secondary | ICD-10-CM | POA: Diagnosis not present

## 2022-08-21 ENCOUNTER — Telehealth: Payer: Self-pay

## 2022-08-21 NOTE — Telephone Encounter (Signed)
Pt called. LVM advised patient that Ozempic has arrived. Meds placed in the frig.

## 2022-08-22 ENCOUNTER — Other Ambulatory Visit: Payer: Self-pay | Admitting: Family Medicine

## 2022-08-22 DIAGNOSIS — Z7985 Long-term (current) use of injectable non-insulin antidiabetic drugs: Secondary | ICD-10-CM | POA: Diagnosis not present

## 2022-08-22 DIAGNOSIS — J45909 Unspecified asthma, uncomplicated: Secondary | ICD-10-CM

## 2022-08-22 DIAGNOSIS — E1159 Type 2 diabetes mellitus with other circulatory complications: Secondary | ICD-10-CM

## 2022-08-22 DIAGNOSIS — E785 Hyperlipidemia, unspecified: Secondary | ICD-10-CM

## 2022-08-22 DIAGNOSIS — I2510 Atherosclerotic heart disease of native coronary artery without angina pectoris: Secondary | ICD-10-CM

## 2022-08-24 ENCOUNTER — Encounter: Payer: Self-pay | Admitting: Emergency Medicine

## 2022-08-24 ENCOUNTER — Ambulatory Visit
Admission: EM | Admit: 2022-08-24 | Discharge: 2022-08-24 | Disposition: A | Discharge status: Home / Self Care | Payer: Medicare Other | Attending: Family Medicine | Admitting: Family Medicine

## 2022-08-24 ENCOUNTER — Ambulatory Visit (INDEPENDENT_AMBULATORY_CARE_PROVIDER_SITE_OTHER): Payer: Medicare Other

## 2022-08-24 DIAGNOSIS — R079 Chest pain, unspecified: Secondary | ICD-10-CM | POA: Diagnosis not present

## 2022-08-24 DIAGNOSIS — R0602 Shortness of breath: Secondary | ICD-10-CM | POA: Diagnosis not present

## 2022-08-24 DIAGNOSIS — R059 Cough, unspecified: Secondary | ICD-10-CM

## 2022-08-24 DIAGNOSIS — Z8701 Personal history of pneumonia (recurrent): Secondary | ICD-10-CM

## 2022-08-24 DIAGNOSIS — R0600 Dyspnea, unspecified: Secondary | ICD-10-CM

## 2022-08-24 DIAGNOSIS — R5383 Other fatigue: Secondary | ICD-10-CM

## 2022-08-24 MED ORDER — AZITHROMYCIN 250 MG PO TABS: ORAL_TABLET | ORAL | 0 refills | Status: DC

## 2022-08-24 MED ORDER — PREDNISONE 20 MG PO TABS: 20.0000 mg | ORAL_TABLET | Freq: Two times a day (BID) | ORAL | 0 refills | Status: DC

## 2022-08-24 NOTE — Discharge Instructions (Signed)
Drink lots of fluids Take the prednisone 2 times a day for 5 days Take the Z-Pak as directed Call your doctor if not improving by next week

## 2022-08-24 NOTE — ED Triage Notes (Signed)
Felt  SOB the last 3 days  Feels like he has pneumonia again - had it in March  Denies fever  Increased fatigue  Os sat at home was 92 on RA Congested cough
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
## 2022-08-25 ENCOUNTER — Telehealth: Payer: Self-pay | Admitting: Emergency Medicine

## 2022-08-25 NOTE — Telephone Encounter (Signed)
Call by this RN to see how Garrett [name] was today. Stated he is taking the medicine, feels about the same, but not worse & thanked me for the care yesterday & follow up call. No other questions or concerns

## 2022-08-28 ENCOUNTER — Telehealth: Payer: Self-pay

## 2022-08-28 ENCOUNTER — Telehealth (INDEPENDENT_AMBULATORY_CARE_PROVIDER_SITE_OTHER): Payer: Medicare Other | Admitting: Internal Medicine

## 2022-08-28 ENCOUNTER — Encounter: Payer: Self-pay | Admitting: Internal Medicine

## 2022-08-28 VITALS — Ht 69.0 in | Wt 205.0 lb

## 2022-08-28 DIAGNOSIS — U071 COVID-19: Secondary | ICD-10-CM | POA: Diagnosis not present

## 2022-08-28 MED ORDER — NIRMATRELVIR/RITONAVIR (PAXLOVID) TABLET (RENAL DOSING): 2.0000 | ORAL_TABLET | Freq: Two times a day (BID) | ORAL | 0 refills | Status: AC

## 2022-08-28 NOTE — Telephone Encounter (Signed)
Patient called in to advise he has taken 3 covid tests and they are all showing positive. His wife tested positive yesterday at the doctor. He would like to know if something can be called in for this. He is mainly having chest congestion. Advised patient he may need to be seen as well. He is okay with that, just wants to know what to do. He prefers [place], [place]; [phone]

## 2022-08-28 NOTE — Telephone Encounter (Signed)
Nurse Assessment Nurse: [name], RN, Garrett [name] Date/Time (Eastern Time): 08/27/2022 6:02:53 PM Confirm and document reason for call. If symptomatic, describe symptoms. ---caller states he took a home test for covid and he was positive. He has been taking prednisone and a zpack for pneumonia on Saturday. He is having lung congestion and SOB, headache. he has one dose left of medications Does the patient have any new or worsening symptoms? ---Yes Will a triage be completed? ---Yes Related visit to physician within the last 2 weeks? ---Yes Does the PT have any chronic conditions? (i.e. diabetes, asthma, this includes High risk factors for pregnancy, etc.) ---Yes List chronic conditions. ---PE, HTN, diabetes Is this a behavioral health or substance abuse call? ---No Guidelines Guideline Title Affirmed Question Affirmed Notes Nurse Date/Time (Eastern Time) COVID-19 - Diagnosed or Suspected MILD difficulty breathing (e.g., minimal/no SOB at rest, SOB with walking, pulse <100) [name], RN, Garrett [name] 08/27/2022 6:06:14 PM PLEASE NOTE: All timestamps contained within this report are represented as [place] Standard Time. CONFIDENTIALTY NOTICE: This fax transmission is intended only for the addressee. It contains information that is legally privileged, confidential or otherwise protected from use or disclosure. If you are not the intended recipient, you are strictly prohibited from reviewing, disclosing, copying using or disseminating any of this information or taking any action in reliance on or regarding this information. If you have received this fax in error, please notify us immediately by telephone so that we can arrange for its return to [place]. Phone: [phone], Toll-Free: [phone], Fax: [phone] Page: 2 of 2 Call Id: [id number] [name] Time [name] Time) Disposition Final User 08/27/2022 6:10:29 PM See HCP within 4 Hours (or PCP triage) Yes [name], RN, Garrett [name] Final  Disposition 08/27/2022 6:10:29 PM See HCP within 4 Hours (or PCP triage) Yes [name], RN, Garrett [name] Caller Disagree/Comply Comply Caller Understands Yes PreDisposition [name] Advice Given Per Guideline SEE HCP (OR PCP TRIAGE) WITHIN 4 HOURS: * IF OFFICE WILL BE CLOSED AND NO PCP (PRIMARY CARE PROVIDER) SECOND-LEVEL TRIAGE: You need to be seen within the next 3 or 4 hours. A nearby Urgent Care Center [facility]) is often a good source of care. Another choice is to go to the ED. Go sooner if you become worse. CALL BACK IF: * You become worse CARE ADVICE given per COVID-19 - DIAGNOSED OR SUSPECTED (Adult) guideline. Referrals GO TO FACILITY UNDECIDED

## 2022-08-28 NOTE — Telephone Encounter (Signed)
Pt scheduled a virtual with Dr. Regis Bill
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
## 2022-08-29 ENCOUNTER — Other Ambulatory Visit: Payer: Medicare Other

## 2022-08-30 ENCOUNTER — Ambulatory Visit (INDEPENDENT_AMBULATORY_CARE_PROVIDER_SITE_OTHER): Payer: Medicare Other | Admitting: Pharmacist

## 2022-08-30 DIAGNOSIS — E1165 Type 2 diabetes mellitus with hyperglycemia: Secondary | ICD-10-CM

## 2022-08-30 DIAGNOSIS — I2699 Other pulmonary embolism without acute cor pulmonale: Secondary | ICD-10-CM

## 2022-08-30 DIAGNOSIS — U071 COVID-19: Secondary | ICD-10-CM

## 2022-08-30 NOTE — Chronic Care Management (AMB) (Signed)
Chronic Care Management Pharmacy Note  08/30/2022 Name:  Garrett [name] MRN:  [id number] DOB:  Apr 12, 1951  Summary:  Garrett [name] to follow up on medication assistance program application for Xarelto. Patient has had COVID pneumonia and has not completed application yet. He does have about 2 or 3 weeks of Xarelto on hand. Patient mentions that he has experience diarrhea with Paxlovid. Paxlovid can cause diarrhea but it can also be a symptoms of COVID. Reminded him to stay hydrated and take Paxlovid with food. He has 6 doses of Paxlovid left. I asked if he thought diarrhea was related to restarting Ozempic but patient feels it is related to Paxlovid.  Patient also mentions cough / congestion. Recommended he take Mucinex 600 to 1200mg  twice a day. Call office if cough dose not improve.   Subjective: Garrett [name] is an 71 y.o. year old male who is a primary patient of Ann Held, DO.  The CCM team was consulted for assistance with disease management and care coordination needs.    Engaged with patient by telephone for follow up visit in response to provider referral for pharmacy case management and/or care coordination services.   Consent to Services:  The patient was given information about Chronic Care Management services, agreed to services, and gave verbal consent prior to initiation of services.  Please see initial visit note for detailed documentation.   Patient Care Team: Carollee Herter, Alferd Apa, DO as PCP - General (Family Medicine) Milus Banister, MD (Gastroenterology) [name], MD (Neurosurgery) Tanda Rockers, MD as Consulting Physician (Pulmonary Disease) Cameron Sprang, MD as Consulting Physician (Neurology) [name], MD as Consulting Physician (Neurosurgery) Marlaine Hind, MD as Consulting Physician (Physical Medicine and Rehabilitation) Cherre Robins, [place] (Pharmacist) Ralene Ok, MD as Consulting Physician (General Surgery) Gardiner Barefoot, DPM  as Consulting Physician (Podiatry) Antionette Fairy, Isaias Cowman, MD as Referring Physician (Ophthalmology)  Recent office visits: 08/28/2022 - Virtual Visit (Dr Regis Bill) COVID positive. Prescribed Paxlovid 2 tablets twice a day for 5 days.  08/28/2022 - [facility] - Phone call. COVID positive. Virtual Visit with Dr Regis Bill.  07/10/2022 - [facility] ([place], [place]) Acute visit for hematuria. Labs ordered. UA was clear - culture pending. Given Rx for ciprofloxacine 500mg  twice a day. 06/07/2022 - [facility]am [facility] (Dr Carollee Herter) F/U chronic conditions. Patient mentions he does not think gabapentin is helping and would like to discontinue. Decreased dose of gabapentin to 100mg  daily. Ordered PT;  02/21/2022 - [facility]am Med (Dr Carollee Herter) F/U hospitalization for pneumonia. Prescribed albuterol neb solution to use up to every 6 hours as needed. Refilled gabapentin  Recent consult visits: 08/28/2022 - GI at [facility] Phone Call (Dr Hurshel Keys) Called patient today to discuss next steps after reviewing available data with esophageal colleagues. Reiterated that ongoing dysphagia likely sequela of multiple prior surgeries and underlying esophageal dysmotility. Offered BaS to further evaluate post-fundoplication esophageal function. Pt amenable. Will place order.  08/07/2022 - Hematology Eulas Post, NP) History of PE with positive lupus anticoagulant. Reduced Xarelto to 10mg  daily 07/31/2022 - GI (Dr Stana Bunting [facility]) Seen for early satiety / dysphagia / diarrhea. Recommended hold Ozempic. Regarding his dysphagia, Planned to review his prior history with esophageal group to see if additional evaluation with Virgel Gess is helpful here. Dr Stana Bunting suspected that he likely has some form of dysmotility in the body of the esophagus that is more evident now following his fundoplication. Did not think additional surgery or EGD w/ empiric dilation would  be beneficial. Diarrhea - stop artificial sweeteners and minimize dairy intake. Recommended  celiac testing. Add fiber supplement.  07/26/2022 - Podiatry (Dr Prudence Davidson) Seen for onychomycosis of toenails of both feet. Ordered diabetic shoes.  06/14/2022 - GI (Dr [name]) Seen for diarrhea. Ordered stool profile. Timing of onset shortly hafter he started mag oxide and double his metformin.  He decreased the metforming back to previous dose and noticed significant improvement in his diarrhea. 06/05/2022 - Neuro (Dr Delice Lesch) F/U weakness in legs and hand. Labs checked  Hospital visits: 08/24/2022 - [facility] Visit for shortness of breath. Prescribed azithromycin x 5 days and predinsone 20mg  twice a day for 5 days 02/17/2022 - to 02/19/2022 [facility] Admission at [facility] for pneumonia and asthma exacerbation.. New medications: cefdinir 300mg  twice a day for 4 more days; Mucinex DM every 12 hours for 10 days; prednisone 20mg  daily for 3 days.  02/17/2022 - Urgent Care at [facility] seen for cough adn SOB. Advised to go to ED / hospital.     Objective:  Lab Results  Component Value Date   CREATININE 1.41 (H) 08/07/2022   CREATININE 1.34 07/10/2022   CREATININE 1.16 11/12/2021    Lab Results  Component Value Date   HGBA1C 7.0 (H) 07/10/2022   Last diabetic Eye exam:  Lab Results  Component Value Date/Time   HMDIABEYEEXA No Retinopathy 12/21/2018 12:34 PM    Last diabetic Foot exam: No results found for: "HMDIABFOOTEX"      Component Value Date/Time   CHOL 135 11/12/2021 1114   CHOL 162 07/18/2015 0744   TRIG 200.0 (H) 11/12/2021 1114   TRIG 110 07/18/2015 0744   HDL 32.00 (L) 11/12/2021 1114   HDL 41 07/18/2015 0744   CHOLHDL 4 11/12/2021 1114   VLDL 40.0 11/12/2021 1114   LDLCALC 63 11/12/2021 1114   LDLCALC 129 (H) 08/17/2020 0948   LDLCALC 99 07/18/2015 0744   LDLDIRECT 104.0 02/14/2020 1025       Latest Ref Rng & Units 08/07/2022    3:09 PM 07/10/2022    4:11 PM 06/07/2022   10:13 AM  Hepatic Function  Total Protein 6.5  - 8.1 g/dL 7.5  6.6  7.5   Albumin 3.5 - 5.0 g/dL 4.3  4.1    AST 15 - 41 U/L 19  27    ALT 0 - 44 U/L 30  36    Alk Phosphatase 38 - 126 U/L 47  44    Total Bilirubin 0.3 - 1.2 mg/dL 0.5  0.4      Lab Results  Component Value Date/Time   TSH 2.75 06/07/2022 10:13 AM   TSH 1.30 04/28/2018 10:16 AM       Latest Ref Rng & Units 08/07/2022    3:09 PM 07/10/2022    4:11 PM 04/19/2022    4:12 PM  CBC  WBC 4.0 - 10.5 K/uL 12.2  10.1  6.6   Hemoglobin 13.0 - 17.0 g/dL 11.8  12.0  12.4   Hematocrit 39.0 - 52.0 % 36.8  36.5  37.5   Platelets 150 - 400 K/uL 287  279.0  286     No results found for: "VD25OH"  Clinical ASCVD: Yes  The 10-year ASCVD risk score (Arnett DK, et al., 2019) is: 43.6%   Values used to calculate the score:     Age: 49 years     Sex: Male     Is Non-Hispanic African American: No  Diabetic: Yes     Tobacco smoker: No     Systolic Blood Pressure: 003 mmHg     Is BP treated: Yes     HDL Cholesterol: 32 mg/dL     Total Cholesterol: 135 mg/dL      Social History   Tobacco Use  Smoking Status Former   Packs/day: 3.00   Years: 30.00   Total pack years: 90.00   Types: Cigarettes   Quit date: 01/09/1991   Years since quitting: 31.6  Smokeless Tobacco Former   Types: Snuff   BP Readings from Last 3 Encounters:  08/24/22 (!) 134/96  08/07/22 138/80  07/10/22 108/68   Pulse Readings from Last 3 Encounters:  08/24/22 84  08/07/22 96  07/10/22 86   Wt Readings from Last 3 Encounters:  08/28/22 205 lb (93 kg)  08/24/22 205 lb 12.8 oz (93.3 kg)  08/07/22 205 lb 12.8 oz (93.4 kg)    Assessment: Review of patient past medical history, allergies, medications, health status, including review of consultants reports, laboratory and other test data, was performed as part of comprehensive evaluation and provision of chronic care management services.   SDOH:  (Social Determinants of Health) assessments and interventions performed:  SDOH Interventions     Flowsheet Row Chronic Care Management from 06/18/2022 in [facility] at [place] Management from 08/30/2021 in [facility] [place] Management from 08/07/2021 in [place] at [place] Interventions     Transportation Interventions Intervention Not Indicated -- --  Financial Strain Interventions Other (Comment)  [assisted with PAP for Ozempic - good thru 12/22/2022,  PAP not available for Xarelto for 2023 for Medicare patients.] Other (Comment)  [completed PAP application today. patient to sign when in office later today.] Other (Comment)  [performed assessment for patient assistance programs for [employer], Symbicort and Xarelto. Patient is stil working and yearly household income if above patient assistance programs cut off.]          CCM Care Plan  Allergies  Allergen Reactions   Bee Venom Anaphylaxis   Garlic Swelling    Medications Reviewed Today     Reviewed by Cherre Robins, RPH-CPP (Pharmacist) on 08/30/22 at 0926  Med List Status: <None>   Medication Order Taking? Sig Documenting Provider Last Dose Status Informant  albuterol (PROVENTIL) (2.5 MG/3ML) 0.083% nebulizer solution [id number] Yes Take 3 mLs (2.5 mg total) by nebulization every 6 (six) hours as needed for wheezing or shortness of breath. [name] R, DO Taking Active   allopurinol (ZYLOPRIM) 100 MG tablet [id number] Yes Take 1 tablet (100 mg total) by mouth daily. Ann Held, DO Taking Active   atorvastatin (LIPITOR) 80 MG tablet [id number] Yes Take 80 mg by mouth daily. [provider] Taking Active   celecoxib (CELEBREX) 200 MG capsule [id number] Yes TAKE 1 CAPSULE BY MOUTH  TWICE DAILY  Patient taking differently: 2 caps in AM   Carollee Herter, Kendrick Fries R, DO Taking Active   cycloSPORINE (RESTASIS) 0.05 % ophthalmic emulsion [id number]  one drop every 12 (twelve) hours.   Patient not taking: Reported on 08/28/2022   [provider]  Active   EPINEPHrine 0.3 mg/0.3 mL IJ SOAJ injection [id number]  Inject 0.3 mg into the muscle as needed for anaphylaxis.   Patient not taking: Reported on 08/28/2022   [provider]  Active Self  famotidine (PEPCID) 20 MG tablet [id number] Yes  Take 1 tablet (20 mg total) by mouth at bedtime.  Patient taking differently: Take 20 mg by mouth 2 (two) times daily. $RemoveBefo'40mg'pKKUbaoAjkV$  in the am   Milus Banister, MD Taking Active Self  fenofibrate 160 MG tablet [id number] Yes Take by mouth. [provider] Taking Active   Ferrous Sulfate (IRON PO) [id number] Yes Take 1 tablet by mouth daily. [provider] Taking Active Self  fluticasone (FLONASE) 50 MCG/ACT nasal spray [id number]  Place 2 sprays into both nostrils daily. Ann Held, DO  Active Self  furosemide (LASIX) 40 MG tablet [id number] Yes Take 40 mg by mouth 2 (two) times daily. $RemoveBefo'40mg'IoCTdEvMVWw$  in the morning  $Remove'40mg'VWQAydg$  in evening [provider] Taking Active Self  gabapentin (NEURONTIN) 100 MG capsule [id number] Yes Take 2 capsules (200 mg total) by mouth at bedtime.  Patient taking differently: Take 100 mg by mouth at bedtime.   Carollee Herter, Kendrick Fries R, DO Taking Active   levocetirizine (XYZAL) 5 MG tablet [id number] Yes TAKE 1 TABLET BY MOUTH IN THE  EVENING Ann Held, DO Taking Active   meclizine (ANTIVERT) 25 MG tablet [id number] No Take 1 tablet (25 mg total) by mouth 3 (three) times daily as needed for dizziness.  Patient not taking: Reported on 08/30/2022   Ann Held, DO Not Taking Active Self  memantine (NAMENDA) 10 MG tablet [id number] Yes Take 10 mg by mouth 2 (two) times daily. [provider] Taking Active   metFORMIN (GLUCOPHAGE-XR) 500 MG 24 hr tablet [id number] Yes Take 1 tablet (500 mg total) by mouth daily with breakfast. Carollee Herter, Alferd Apa, DO Taking Active   metoprolol tartrate (LOPRESSOR) 50 MG tablet [id number]  Yes Take 50 mg by mouth 2 (two) times daily. [provider] Taking Active Self  Multiple Vitamins-Minerals (ONE-A-DAY WEIGHT SMART ADVANCE PO) [id number] Yes Take 1 tablet by mouth daily. Centrum Silver [provider] Taking Active Self  nirmatrelvir/ritonavir EUA, renal dosing, (PAXLOVID) 10 x 150 MG & 10 x $Re'100MG'OIq$  TABS [id number] Yes Take 2 tablets by mouth 2 (two) times daily for 5 days. (Take nirmatrelvir 150 mg one tablet twice daily for 5 days and ritonavir 100 mg one tablet twice daily for 5 days) Patient GFR is 53 Panosh, Standley Brooking, MD Taking Active   nitroGLYCERIN (NITROSTAT) 0.4 MG SL tablet [id number] Yes Place 0.4 mg under the tongue every 5 (five) minutes as needed for chest pain. [provider] Taking Active Self  omeprazole (PRILOSEC) 40 MG capsule [id number] Yes Take 1 capsule by mouth daily. [provider] Taking Active   [facility] ULTRA test strip [id number] Yes USE AS DIRECTED 3 TIMES  DAILY Carollee Herter, Alferd Apa, DO Taking Active   potassium chloride SA (KLOR-CON M) 20 MEQ tablet [id number] Yes Take 2 tablets (40 mEq total) by mouth daily. Carollee Herter, Alferd Apa, DO Taking Active   PROAIR HFA 108 [phone] Base) MCG/ACT inhaler [id number]  Inhale 1 puff into the lungs every 6 (six) hours as needed for wheezing or shortness of breath. Ann Held, DO  Active Self  rivaroxaban (XARELTO) 10 MG TABS tablet [id number] Yes Take 10 mg by mouth daily. [provider] Taking Active   Semaglutide (OZEMPIC, 0.25 OR 0.5 MG/DOSE, [place]) [id number] Yes Inject 0.25 mg into the skin once a week. [provider] Taking Active   SYMBICORT 80-4.5 MCG/ACT inhaler [id number] Yes USE 2 INHALATIONS BY MOUTH DAILY Ann Held, DO Taking Active   tadalafil,  PAH, (ADCIRCA) 20 MG tablet [id number] No Take by mouth.  Patient not taking: Reported on 08/28/2022   [provider] Not Taking Active   topiramate (TOPAMAX) 50 MG tablet [id number] Yes TAKE 1  TABLET BY MOUTH  TWICE DAILY Cameron Sprang, MD Taking Active   vitamin B-12 (CYANOCOBALAMIN) 1000 MCG tablet [id number] Yes Take 1,000 mcg by mouth daily. [provider] Taking Active Self            Patient Active Problem List   Diagnosis Date Noted   Uncontrolled type 2 diabetes mellitus with hyperglycemia ([place]) 06/07/2022   Hematuria 05/03/2022   Urinary tract infection with hematuria 04/19/2022   Weakness of both lower extremities 02/25/2022   Frequent falls 02/25/2022   Coronary artery disease involving native coronary artery of native heart without angina pectoris 02/17/2022   Acute recurrent ethmoidal sinusitis 02/17/2022   Sensorineural hearing loss (SNHL) of both ears 02/17/2022   DOE (dyspnea on exertion) 05/11/2021   Abnormal stress test 04/20/2021   S/P Nissen fundoplication (without gastrostomy tube) procedure 02/20/2021   Body mass index (BMI) 33.0-33.9, adult 11/03/2019   Laceration of right hand 08/02/2019   Acute pulmonary embolism (HCC) 01/07/2019   Chronic diastolic CHF (congestive heart failure) ([place]) 01/07/2019   Preventative health care 02/10/2018   Spondylolisthesis at L3-L4 level 10/28/2017   Cough variant asthma  vs uacs/ pseudoasthma 06/17/2017   Pulmonary embolism and infarction ([place]) 02/09/2017   Bilateral pulmonary embolism ([place]) 02/04/2017   Greater trochanteric bursitis of left hip 01/14/2017   Greater trochanteric bursitis of right hip 12/20/2016   Degenerative arthritis of knee, bilateral 10/08/2016   Upper airway cough syndrome 03/22/2016   Multiple pulmonary nodules 03/22/2016   Headache disorder 01/04/2016   History of colonic polyps 04/11/2015   Lumbar stenosis with neurogenic claudication 11/14/2014   Spondylolysis of cervical region 02/25/2014   Lumbosacral spondylosis without myelopathy 01/06/2014   OSA (obstructive sleep apnea) 12/08/2013   SOB (shortness of breath) on exertion 11/04/2013   Morbid obesity due to excess  calories ([place])    Venous insufficiency of leg 02/18/2012   Itching 02/18/2012   Mild dementia ([place]) 05/28/2011   Hyperlipidemia associated with type 2 diabetes mellitus ([place]) 05/27/2011   Controlled type 2 diabetes mellitus with diabetic nephropathy ([place]) 04/10/2011   Gout 04/10/2011   Essential hypertension 04/10/2011   OA (osteoarthritis) 04/10/2011   GERD (gastroesophageal reflux disease) 04/10/2011   Migraine headache without aura 04/10/2011   Allergic rhinitis, cause unspecified 04/10/2011   Bee sting allergy 04/10/2011   Generalized osteoarthritis of multiple sites 04/10/2011   Hypertension associated with stage 2 chronic kidney disease due to type 2 diabetes mellitus ([place]) 04/10/2011    Immunization History  Administered Date(s) Administered   Fluad Quad(high Dose 65+) 10/20/2018, 10/07/2019, 10/19/2020, 08/30/2021   Influenza Split 09/30/2012   Influenza, High Dose Seasonal PF 09/19/2016, 10/07/2017, 10/16/2018   Influenza,inj,Quad PF,6+ Mos 09/15/2013, 09/26/2014, 09/22/2015   Influenza,inj,quad, With Preservative 09/15/2013, 09/26/2014, 09/22/2015   PFIZER(Purple Top)SARS-COV-2 Vaccination 02/12/2020, 03/07/2020, 08/19/2020   Pneumococcal Conjugate-13 07/18/2015   Pneumococcal Polysaccharide-23 04/10/2011, 09/19/2016   Tdap 04/10/2011, 04/09/2019, 07/22/2019   Zoster Recombinat (Shingrix) 07/04/2022   Zoster, Live 03/28/2014    Conditions to be addressed/monitored: CHF, CAD, HTN, HLD, Hypertriglyceridemia, DMII, and Asthma, neuropathy, migraine headaches, GERD, gout, mild anemia, OSA; recurrent PE with chronic anticoagulation  Care Plan : General Pharmacy (Adult)  Updates made by Cherre Robins, RPH-CPP since 08/30/2022 12:00 AM     Problem: asthma, hyperlipidemia;  type 2 DM; CAD; CHF; HTN; recurrent PE; chronic anticoagulation; migraines; neuropathy; gout; GERD; mild dementia; vertigo; OSA; seasonal allergies   Priority: High  Onset Date: 08/07/2021  Note:   Current  Barriers:  Unable to maintain control of type 2 DM Does not adhere to prescribed medication regimen Education needed regarding medication regimen and chronic conditions  Pharmacist Clinical Goal(s):  Over the next 90 days, patient will achieve adherence to monitoring guidelines and medication adherence to achieve therapeutic efficacy achieve control of type 2 DM as evidenced by A1c < 7.0 maintain control of HTN, CHF, hyperlipidemia and asthma  as evidenced by attainment of goals listed below  through collaboration with PharmD and provider.   Interventions: 1:1 collaboration with Carollee Herter, Alferd Apa, DO regarding development and update of comprehensive plan of care as evidenced by provider attestation and co-signature Inter-disciplinary care team collaboration (see longitudinal plan of care) Comprehensive medication review performed; medication list updated in electronic medical record    Hypertension / CHF:  Controlled; Goal: BP <140/90, minimize symptoms of CHF and prevent CHF exacerbations Current regimen:  Metoprolol tartrate 50mg  twice a day Furosemide 40mg  each morning and 40mg  each evening Type: Diastolic Last ejection fraction: 60-65% (08/16/2021) - previously on 03/03/17 EF: 65-70% NYHA Class: II (slight limitation of activity) AHA HF Stage: B (Heart disease present - no symptoms present) Patient has failed these meds in past: verapamil Interventions: (addressed at previous visit)  Discussed blood pressure goal Check blood pressure 2 to 3 time per week, document, and provide at future appointments Ensure daily salt intake < 2300 mg/day  Hyperlipidemia / CAD;  LDL at goal; LDL goal < 70 but triglycerides still > goal of 150 Current regimen:  Atorvastatin 80mg  daily  Fenofibrate 160mg  daily Interventions: Discussed LDL goal Maintain cholesterol medication regimen - expect triglycerides will improved with improved BG  Diabetes A1c improved but not at goal yet;  A1c goal  <7.0 Checks BG: Daily Recent FBG Readings: 130-140's (previously 150-160's) Denie s/sx of hypoglycemia.  Weight prior to starting ozempic = 224lbs Current weight = 204 lbs Total weight loss = 20lbs Current regimen:  Metformin 500mg  once a day with food Ozempic 0.25mg  injected once per week Previous medications tried: metformin 1000mg  bid - GI side effects; Victoza daily - stopped after weight loss and improvement in A1c. Approved to received [employer] patient assistance program thru 11/21/2022.  Patient has seen GI recently for dysphagia and diarrhea. Has been holding Ozempic for last 3 weeks.  Interventions: Discussed A1c goals Continue metformin ER 500mg  daily  Reviewed home blood glucose readings and reviewed goals  Fasting blood glucose goal (before meals) = 80 to 130 Blood glucose goal after a meal = less than 180    History of Pulmonary embolism (blood clot)  Controlled; Goal: prevent recurrence of blood clots and prevent stroke First PE that diagnosed in April 2018 with positive lupus anticoagulant.  Patient was treated with Xarelto for 1 year.  In November 2019 he was changed from Xarelto to Aspirin 325 mg. 12/2018 had recurrent PE. Xarleto 20mg  daily was restarted.  Recent hematuria - continues to occur about once per week. Treated with ciprofloxacin for 10 days with improvement but not resolution of hematuria Current regimen:  Xarelto 10mg  daily with supper (dose of lowered 08/16/203 by hematology) Interventions:  Continue to take Xarelto daily  Provided Wynetta Emery and [employer] application for medication assistance program for [employer]. (Addressed at previous visit)   Asthma: Controlled; Goal: improve breathing  and prevent hospitalizations related to asthma exacerbations Current regimen:  Symbicort 80/4.72mcg - inhaler 2 puffs into lungs once a day (during allergy season) Albuterol inhaler or nebulization solution - use as needed for shortness of breath or  wheezing Patient has failed these meds in past: Trelegy  Using maintenance inhaler regularly? Yes during allergy season (Spring and Fall)  Frequency of rescue inhaler use: rarely uses currently but during allergy season 1 or 2 time per week Managed by pulmonology - Dr. Melvyn Novas Interventions: Discussed maintenance versus rescue therapy.  Discussed seasonal triggers  Continue to follow up with Dr Melvyn Novas  Continue above medication regimen.   Medication management Pharmacist Clinical Goal(s): Over the next 90 days, patient will work with PharmD and providers to maintain optimal medication adherence Patient is taking memantine $RemoveBeforeDE'10mg'bWTwzgUvdsbvwPE$  - 2 tablets once a day - recommended to take $Remov'10mg'WrqlbM$  1 tablet twice a day.  Current pharmacy: [facility] Interventions Comprehensive medication review performed. Continue current medication management strategy Focus on medication adherence by filling and taking medications appropriately  Take medications as prescribed Report any questions or concerns to PharmD and/or provider(s)  Patient Goals/Self-Care Activities Over the next 90 days, patient will:  take medications as prescribed,  check glucose daily, document, and provide at future appointments,  collaborate with provider on medication access solutions. Continue metformin to ER $Rem'500mg'sUuC$  - take 1 tablet daily with food. Complete medication assistance program application for Xarelto and return to our office.  Continue Ozempic at 0.$RemoveBefo'25mg'kuYChlMVXSl$  weekly - monitor for GI discomfort and diarrhea - stop if these symptoms return.   Follow Up Plan: Telephone follow up appointment with care management team member scheduled for:  2 to 3  weeks        Medication Assistance:  Working on [employer] patient assistance program for [employer]. Need updated income documentation. Patient aware and will bring into office in the next week.     No medication assistance program for Xarelto for Medicare patients in 2023.  Provided information to patient on Network engineer program by [employer].   Patient's preferred pharmacy is:  [facility], [place] - [address] [address] Phone: [phone] Fax: [phone]  [facility] ([place], [place] [facility] [address] [place] [address] [place] [address] Phone: [phone] Fax: [phone]  [facility] ([facility]) - [place], [place] [address] Phone: [phone] Fax: [phone]   Uses pill box? Yes Pt endorses 99% compliance  Follow Up:  Patient agrees to Care Plan and Follow-up.  Plan: Telephone follow up appointment with care management team member scheduled for:  2 to 3 weeks  Cherre Robins, PharmD Clinical Pharmacist [place] [facility] [place] [facility]

## 2022-08-30 NOTE — Patient Instructions (Signed)
Garrett [name] It was a pleasure speaking with you today.  Below is a summary of your health goals and summary of our recent visit. You can also view your updated Chronic Care Management Care plan through your MyChart account.   Patient Goals/Self-Care Activities Over the next 90 days, patient will:  take medications as prescribed,  check glucose daily, document, and provide at future appointments,  collaborate with provider on medication access solutions. Continue metformin to ER '500mg'$  - take 1 tablet daily with food. Complete medication assistance program application for Xarelto and return to our office.  Continue Ozempic at 0.'25mg'$  weekly - monitor for GI discomfort and diarrhea - stop if these symptoms return.   Follow Up Plan: Telephone follow up appointment with care management team member scheduled for:  2 to 3  weeks   As always if you have any questions or concerns especially regarding medications, please feel free to contact me either at the phone number below or with a MyChart message.   Keep up the good work!  Cherre Robins, PharmD Clinical Pharmacist [place] [phone] (direct line)  [phone] (main office number)   Patient verbalizes understanding of instructions and care plan provided today and agrees to view in [place]. Active MyChart status and patient understanding of how to access instructions and care plan via MyChart confirmed with patient.

## 2022-09-02 ENCOUNTER — Ambulatory Visit (INDEPENDENT_AMBULATORY_CARE_PROVIDER_SITE_OTHER): Payer: Medicare Other

## 2022-09-02 DIAGNOSIS — E1121 Type 2 diabetes mellitus with diabetic nephropathy: Secondary | ICD-10-CM

## 2022-09-02 DIAGNOSIS — E1149 Type 2 diabetes mellitus with other diabetic neurological complication: Secondary | ICD-10-CM

## 2022-09-02 NOTE — Progress Notes (Signed)
Patient presents to the office today for diabetic shoe and insole measuring.  Patient was measured with brannock device to determine size and width for 1 pair of extra depth shoes and foam casted for 3 pair of insoles.   Documentation of medical necessity will be sent to patient's treating diabetic doctor to verify and sign.   Patient's diabetic provider: [name], DO  Shoes and insoles will be ordered at that time and patient will be notified for an appointment for fitting when they arrive.   Shoe size (per patient): 9 wide men's    Brannock measurement: 8.5 wide men's  Patient shoe selection-   1st choice:   633 orthofeet lava  2nd choice:  612 orthofeet cobalt work  Engineer, structural size ordered: 8.5 wide men's

## 2022-09-05 ENCOUNTER — Telehealth: Payer: Self-pay | Admitting: Medical

## 2022-09-05 MED ORDER — CIPROFLOXACIN HCL 500 MG PO TABS: 500.0000 mg | ORAL_TABLET | Freq: Two times a day (BID) | ORAL | 0 refills | Status: DC

## 2022-09-05 NOTE — Telephone Encounter (Signed)
Opened to review 

## 2022-09-05 NOTE — Addendum Note (Signed)
Addended by: Anabel Halon on: 09/05/2022 07:11 PM   Modules accepted: Orders

## 2022-09-06 ENCOUNTER — Ambulatory Visit (HOSPITAL_BASED_OUTPATIENT_CLINIC_OR_DEPARTMENT_OTHER)
Admission: RE | Admit: 2022-09-06 | Discharge: 2022-09-06 | Disposition: A | Discharge status: Home / Self Care | Payer: Medicare Other | Source: Ambulatory Visit | Attending: Medical | Admitting: Medical

## 2022-09-06 ENCOUNTER — Ambulatory Visit (INDEPENDENT_AMBULATORY_CARE_PROVIDER_SITE_OTHER): Payer: Medicare Other | Admitting: Medical

## 2022-09-06 VITALS — BP 137/79 | HR 72 | Temp 97.7°F | Resp 18 | Ht 69.0 in | Wt 203.2 lb

## 2022-09-06 DIAGNOSIS — R0602 Shortness of breath: Secondary | ICD-10-CM | POA: Diagnosis not present

## 2022-09-06 DIAGNOSIS — Z8616 Personal history of COVID-19: Secondary | ICD-10-CM

## 2022-09-06 DIAGNOSIS — Z86711 Personal history of pulmonary embolism: Secondary | ICD-10-CM

## 2022-09-06 DIAGNOSIS — J4520 Mild intermittent asthma, uncomplicated: Secondary | ICD-10-CM | POA: Diagnosis not present

## 2022-09-06 DIAGNOSIS — E1165 Type 2 diabetes mellitus with hyperglycemia: Secondary | ICD-10-CM

## 2022-09-06 LAB — COMPREHENSIVE METABOLIC PANEL
AG Ratio: 1.5 (calc) (ref 1.0–2.5)
ALT: 67 U/L — ABNORMAL HIGH (ref 9–46)
AST: 54 U/L — ABNORMAL HIGH (ref 10–35)
Albumin: 4.1 g/dL (ref 3.6–5.1)
Alkaline phosphatase (APISO): 50 U/L (ref 35–144)
BUN: 15 mg/dL (ref 7–25)
CO2: 27 mmol/L (ref 20–32)
Calcium: 9.8 mg/dL (ref 8.6–10.3)
Chloride: 106 mmol/L (ref 98–110)
Creat: 1.13 mg/dL (ref 0.70–1.28)
Globulin: 2.7 g/dL (calc) (ref 1.9–3.7)
Glucose, Bld: 100 mg/dL — ABNORMAL HIGH (ref 65–99)
Potassium: 3.9 mmol/L (ref 3.5–5.3)
Sodium: 143 mmol/L (ref 135–146)
Total Bilirubin: 0.5 mg/dL (ref 0.2–1.2)
Total Protein: 6.8 g/dL (ref 6.1–8.1)

## 2022-09-06 LAB — CBC WITH DIFFERENTIAL/PLATELET
Absolute Monocytes: 631 cells/uL (ref 200–950)
Basophils Absolute: 42 cells/uL (ref 0–200)
Basophils Relative: 0.5 %
Eosinophils Absolute: 166 cells/uL (ref 15–500)
Eosinophils Relative: 2 %
HCT: 37.6 % — ABNORMAL LOW (ref 38.5–50.0)
Hemoglobin: 12.5 g/dL — ABNORMAL LOW (ref 13.2–17.1)
Lymphs Abs: 1951 cells/uL (ref 850–3900)
MCH: 29.7 pg (ref 27.0–33.0)
MCHC: 33.2 g/dL (ref 32.0–36.0)
MCV: 89.3 fL (ref 80.0–100.0)
MPV: 10.1 fL (ref 7.5–12.5)
Monocytes Relative: 7.6 %
Neutro Abs: 5511 cells/uL (ref 1500–7800)
Neutrophils Relative %: 66.4 %
Platelets: 274 10*3/uL (ref 140–400)
RBC: 4.21 10*6/uL (ref 4.20–5.80)
RDW: 14.9 % (ref 11.0–15.0)
Total Lymphocyte: 23.5 %
WBC: 8.3 10*3/uL (ref 3.8–10.8)

## 2022-09-06 LAB — BRAIN NATRIURETIC PEPTIDE: Brain Natriuretic Peptide: 34 pg/mL (ref <100)

## 2022-09-06 MED ORDER — IOHEXOL 350 MG/ML SOLN
75.0000 mL | Freq: Once | INTRAVENOUS | Status: AC | PRN
  Administered 2022-09-06: 75 mL via INTRAVENOUS

## 2022-09-06 NOTE — Patient Instructions (Addendum)
Covid infection early September. Got zpack, prednisone and paxlovid. Some shortness of breath for 5 days but described dramatic dyspnea yesterday for 4 hours with 02 sat in 88%. Though today 98%. On review of hx of PE and recent reduced  xarelto dose one month ago .Do think best to try to get ct angio chest today stat. Order placed. Will send message to staff to see if we can get that done today. May need prior authorization.  Hx of asthma on review. Increase symbicort 2 inhaltion twice daily. Use albuterol as back up if needed.  Get cbc, cmp and bnp stat today.  Follow up in one week or sooner if needed.

## 2022-09-06 NOTE — Progress Notes (Signed)
   Subjective:    Patient ID: Garrett [name], male    DOB: 1951-11-18, 71 y.o.   MRN: [id number]  HPI  Pt in for follow up.   He states he go covid more than a week ago.  Pt states he got short of breath acutely since yesterday he states he felt like maybe asthma attack. He does not remember any wheezing.   Pt states at time had acute shortness of breath episode o2 sat dropped to 88%. Shortness of breath lasted for about 4 hours. He left work and when he got home 02 sat 88-89%. Pulse was 120. After nap pulse 50's and o2 back up to 95%.   He then clarifies he feels sob for 4-5 days.   Pt did take paxlovid. On review it looks like he got initialy covid symptoms on 08-24-2022.  Since 08-24-2022 he took prednisone, azithromycin and zpack.   In office pulse 73. O2 sat % 98.  Pt is on xarelto 10 mg daily. About 3 weeks ago cut back 20 mg to 10 mg.    Hx of asthma as child per pt. Pt pcp has given him albuterol in the past.   Review of Systems  Constitutional:  Negative for chills, fatigue and fever.  Respiratory:  Positive for cough and shortness of breath. Negative for chest tightness and wheezing.        Occasional cough.  Cardiovascular:  Negative for chest pain and palpitations.  Gastrointestinal:  Negative for abdominal pain and nausea.  Genitourinary:  Negative for dysuria and flank pain.  Musculoskeletal:  Negative for back pain and myalgias.  Skin:  Negative for rash.       Objective:   Physical Exam  General Mental Status- Alert. General Appearance- Not in acute distress.   Skin General: Color- Normal Color. Moisture- Normal Moisture.  Neck Carotid Arteries- Normal color. Moisture- Normal Moisture. No carotid bruits. No JVD.  Chest and Lung Exam Auscultation: Breath Sounds:-even and unlabored. 02 sat % stayed 98% and pulse in 80s walking to lab and back.   Cardiovascular Auscultation:Rythm- Regular. Murmurs & Other Heart Sounds:Auscultation of the heart  reveals- No Murmurs.  Abdomen Inspection:-Inspeection Normal. Palpation/Percussion:Note:No mass. Palpation and Percussion of the abdomen reveal- Non Tender, Non Distended + BS, no rebound or guarding.   Neurologic Cranial Nerve exam:- CN III-XII intact(No nystagmus), symmetric smile. Strength:- 5/5 equal and symmetric strength both upper and lower extremities.   Lower ext- calfs symmetric and no pedal edema.    Assessment & Plan:   Patient Instructions  Covid infection early September. Got zpack, prednisone and paxlovid. Some shortness of breath for 5 days but described dramatic dyspnea yesterday for 4 hours with 02 sat in 88%. Though today 98%. On review of hx of PE and recent reduced  xarelto dose one month ago .Do think best to try to get ct angio chest today stat. Order placed. Will send message to staff to see if we can get that done today. May need prior authorization.  Hx of asthma on review. Increase symbicort 2 inhaltion twice daily. Use albuterol as back up if needed.  Get cbc, cmp and bnp stat today.  Follow up in one week or sooner if needed.     Mackie Pai, PA-C   Time spent with patient today was  43 minutes which consisted of chart review, discussing diagnosis, work up, treatment, coordianting prior auth request and documentation.

## 2022-09-13 ENCOUNTER — Other Ambulatory Visit: Payer: Self-pay | Admitting: Neurology

## 2022-09-16 ENCOUNTER — Other Ambulatory Visit: Payer: Self-pay | Admitting: Family Medicine

## 2022-09-16 DIAGNOSIS — R911 Solitary pulmonary nodule: Secondary | ICD-10-CM

## 2022-09-17 DIAGNOSIS — I509 Heart failure, unspecified: Secondary | ICD-10-CM | POA: Diagnosis not present

## 2022-09-17 DIAGNOSIS — M1990 Unspecified osteoarthritis, unspecified site: Secondary | ICD-10-CM | POA: Diagnosis not present

## 2022-09-21 DIAGNOSIS — I2510 Atherosclerotic heart disease of native coronary artery without angina pectoris: Secondary | ICD-10-CM | POA: Diagnosis not present

## 2022-09-21 DIAGNOSIS — E1159 Type 2 diabetes mellitus with other circulatory complications: Secondary | ICD-10-CM | POA: Diagnosis not present

## 2022-09-21 DIAGNOSIS — Z7985 Long-term (current) use of injectable non-insulin antidiabetic drugs: Secondary | ICD-10-CM

## 2022-09-21 DIAGNOSIS — I509 Heart failure, unspecified: Secondary | ICD-10-CM

## 2022-09-21 DIAGNOSIS — E785 Hyperlipidemia, unspecified: Secondary | ICD-10-CM | POA: Diagnosis not present

## 2022-09-21 DIAGNOSIS — J45909 Unspecified asthma, uncomplicated: Secondary | ICD-10-CM

## 2022-09-21 DIAGNOSIS — Z7984 Long term (current) use of oral hypoglycemic drugs: Secondary | ICD-10-CM

## 2022-09-21 DIAGNOSIS — I110 Hypertensive heart disease with heart failure: Secondary | ICD-10-CM

## 2022-09-24 DIAGNOSIS — R310 Gross hematuria: Secondary | ICD-10-CM | POA: Diagnosis not present

## 2022-10-02 DIAGNOSIS — H903 Sensorineural hearing loss, bilateral: Secondary | ICD-10-CM | POA: Diagnosis not present

## 2022-10-03 ENCOUNTER — Other Ambulatory Visit: Payer: Self-pay | Admitting: Family Medicine

## 2022-10-10 ENCOUNTER — Telehealth: Payer: Self-pay

## 2022-10-10 NOTE — Patient Outreach (Signed)
  Care Coordination   Initial Visit Note   10/10/2022 Name: MATAN STEEN MRN: [id number] DOB: 1951/12/16  Juanda Bond Amyx is a 71 y.o. year old male who sees Carollee Herter, Alferd Apa, DO for primary care. I spoke with  Juanda Bond [place] by phone today.  What matters to the patients health and wellness today?  Mr. Easterly states he is still working. He reports he follows up with primary care provider as scheduled, he reports he is also being followed by embedded clinical pharmacist. Patient denies any care coordination, resource or disease management needs at this time.    Goals Addressed             This Visit's Progress    COMPLETED: Care Coordination Activities-no follow up required       Care Coordination Interventions: Discussed care coordination program with patient Encouraged patient to contact care coordinator, primary care provider or embedded clinical pharmacist if care coordination needs in the future. Contact number provided       SDOH assessments and interventions completed:  Yes  SDOH Interventions Today    Flowsheet Row Most Recent Value  SDOH Interventions   Food Insecurity Interventions Intervention Not Indicated  Transportation Interventions Intervention Not Indicated  Utilities Interventions Intervention Not Indicated     Care Coordination Interventions Activated:  Yes  Care Coordination Interventions:  Yes, provided   Follow up plan: No further intervention required.   Encounter Outcome:  Pt. Visit Completed   Thea Silversmith, RN, MSN, BSN, [place] Coordinator [phone]

## 2022-10-14 DIAGNOSIS — R310 Gross hematuria: Secondary | ICD-10-CM | POA: Diagnosis not present

## 2022-10-17 ENCOUNTER — Telehealth: Payer: Self-pay

## 2022-10-17 DIAGNOSIS — M1990 Unspecified osteoarthritis, unspecified site: Secondary | ICD-10-CM | POA: Diagnosis not present

## 2022-10-17 DIAGNOSIS — I509 Heart failure, unspecified: Secondary | ICD-10-CM | POA: Diagnosis not present

## 2022-10-17 NOTE — Telephone Encounter (Signed)
Mailed Novo Nordisk renewal application to patient home.   Leani Myron L. CPhT Rx Patient Advocate  

## 2022-10-18 ENCOUNTER — Other Ambulatory Visit: Payer: Self-pay | Admitting: Family Medicine

## 2022-10-18 DIAGNOSIS — R059 Cough, unspecified: Secondary | ICD-10-CM

## 2022-10-19 ENCOUNTER — Encounter: Payer: Self-pay | Admitting: Podiatry

## 2022-10-20 ENCOUNTER — Emergency Department (HOSPITAL_BASED_OUTPATIENT_CLINIC_OR_DEPARTMENT_OTHER)
Admission: EM | Admit: 2022-10-20 | Discharge: 2022-10-20 | Disposition: A | Discharge status: Home / Self Care | Payer: Medicare Other | Attending: Emergency Medicine | Admitting: Emergency Medicine

## 2022-10-20 ENCOUNTER — Encounter: Payer: Self-pay | Admitting: Emergency Medicine

## 2022-10-20 ENCOUNTER — Emergency Department (HOSPITAL_BASED_OUTPATIENT_CLINIC_OR_DEPARTMENT_OTHER): Payer: Medicare Other

## 2022-10-20 ENCOUNTER — Encounter (HOSPITAL_BASED_OUTPATIENT_CLINIC_OR_DEPARTMENT_OTHER): Payer: Self-pay | Admitting: Emergency Medicine

## 2022-10-20 ENCOUNTER — Ambulatory Visit
Admission: EM | Admit: 2022-10-20 | Discharge: 2022-10-20 | Disposition: A | Discharge status: Other Acute Inpatient Hospital | Payer: Medicare Other

## 2022-10-20 DIAGNOSIS — L539 Erythematous condition, unspecified: Secondary | ICD-10-CM | POA: Diagnosis not present

## 2022-10-20 DIAGNOSIS — Z86718 Personal history of other venous thrombosis and embolism: Secondary | ICD-10-CM

## 2022-10-20 DIAGNOSIS — L03115 Cellulitis of right lower limb: Secondary | ICD-10-CM | POA: Diagnosis not present

## 2022-10-20 DIAGNOSIS — M7989 Other specified soft tissue disorders: Secondary | ICD-10-CM

## 2022-10-20 DIAGNOSIS — R2240 Localized swelling, mass and lump, unspecified lower limb: Secondary | ICD-10-CM | POA: Diagnosis not present

## 2022-10-20 DIAGNOSIS — Z7901 Long term (current) use of anticoagulants: Secondary | ICD-10-CM

## 2022-10-20 DIAGNOSIS — I509 Heart failure, unspecified: Secondary | ICD-10-CM | POA: Diagnosis not present

## 2022-10-20 MED ORDER — CEPHALEXIN 500 MG PO CAPS: 500.0000 mg | ORAL_CAPSULE | Freq: Four times a day (QID) | ORAL | 0 refills | Status: DC

## 2022-10-20 NOTE — ED Notes (Signed)
Patient is being discharged from the Urgent Care and sent to the Emergency Department via private vehicle . Per Dr Blanchie Serve, patient is in need of higher level of care due to further evaluation. Patient is aware and verbalizes understanding of plan of care.  Vitals:   10/20/22 0937  BP: 130/86  Pulse: 72  Resp: 18  Temp: 98.3 F (36.8 C)  SpO2: 97%

## 2022-10-20 NOTE — Discharge Instructions (Addendum)
It was a pleasure taking care of you today!   Your ultrasound study did not show any concerns for blood clots to your bilateral legs.  Call your vascular specialist tomorrow to set up a follow-up appointment regarding today's ED visit.  Here is there information attached:   [facility] - [address]  [address]  [place], [place] [address]  [phone]   Attached is information as well for cones vascular surgeon that you may call and set up a follow-up appointment regarding today's ED visit.  It is important that you continue wearing your compression stockings and elevate your legs above the level of the heart while you are sitting.  You will also be sent a prescription called Keflex (antibiotic) to aid with concerns for the skin infection to your right foot, take as directed.  You may follow-up with your primary care provider as needed.  Return to the emergency department for experiencing increasing/worsening symptoms.

## 2022-10-20 NOTE — ED Triage Notes (Signed)
Patient c/o bilateral feet swelling x 2 days.  No apparent injury.  Some swelling in lower legs.  Patient is wearing support hose to help with the swelling
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
## 2022-10-20 NOTE — ED Provider Notes (Signed)
[facility]ERGENCY DEPARTMENT Provider Note   CSN: [id number] Arrival date & time: 10/20/22  1058     History  No chief complaint on file.   Garrett [name] is a 71 y.o. male who presents to the emergency department with concerns for bilateral feet swelling redness onset 3-4 days.  Denies recent fall, trauma, injury, heavy lifting.  Also notes knots to the back of his legs.  Patient took Xarelto and notes that his dose was reduced in August.  He takes Xarelto due to a previous PE.  Denies missed doses of his medication.  Has tried compression stockings at home for symptoms.  Was evaluated urgent care and told to come to the emergency department for his symptoms.  Denies chest pain, shortness of breath, knee pain.  Has a history of CHF and is compliant with his medications.  The history is provided by the patient. No language interpreter was used.       Home Medications Prior to Admission medications   Medication Sig Start Date End Date Taking? Authorizing Provider  cephALEXin (KEFLEX) 500 MG capsule Take 1 capsule (500 mg total) by mouth 4 (four) times daily. 10/20/22  Yes Eulalia Ellerman A, PA-C  albuterol (PROVENTIL) (2.5 MG/3ML) 0.083% nebulizer solution Take 3 mLs (2.5 mg total) by nebulization every 6 (six) hours as needed for wheezing or shortness of breath. 02/21/22   Ann Held, DO  allopurinol (ZYLOPRIM) 100 MG tablet Take 1 tablet (100 mg total) by mouth daily. 08/12/22   Ann Held, DO  atorvastatin (LIPITOR) 80 MG tablet Take 80 mg by mouth daily. 07/31/21   [provider]  celecoxib (CELEBREX) 200 MG capsule TAKE 1 CAPSULE BY MOUTH  TWICE DAILY Patient taking differently: 2 caps in AM 05/07/22   Carollee Herter, Kendrick Fries R, DO  fenofibrate 160 MG tablet TAKE 1 TABLET BY MOUTH DAILY 10/04/22   Carollee Herter, Alferd Apa, DO  Ferrous Sulfate (IRON PO) Take 1 tablet by mouth daily.    [provider]  fluticasone (FLONASE) 50 MCG/ACT nasal  spray Place 2 sprays into both nostrils daily. 09/10/19   Ann Held, DO  furosemide (LASIX) 40 MG tablet Take 40 mg by mouth 2 (two) times daily. '40mg'$  in the morning  '40mg'$  in evening    [provider]  gabapentin (NEURONTIN) 100 MG capsule Take 2 capsules (200 mg total) by mouth at bedtime. Patient taking differently: Take 100 mg by mouth at bedtime. 02/21/22   Ann Held, DO  levocetirizine (XYZAL) 5 MG tablet Take 1 tablet (5 mg total) by mouth every evening. 10/18/22   Ann Held, DO  meclizine (ANTIVERT) 25 MG tablet Take 1 tablet (25 mg total) by mouth 3 (three) times daily as needed for dizziness. 02/14/20   Ann Held, DO  memantine (NAMENDA) 10 MG tablet Take 10 mg by mouth 2 (two) times daily. 03/14/20   [provider]  metFORMIN (GLUCOPHAGE-XR) 500 MG 24 hr tablet Take 1 tablet (500 mg total) by mouth daily with breakfast. 08/23/22   Carollee Herter, Alferd Apa, DO  metoprolol tartrate (LOPRESSOR) 50 MG tablet Take 50 mg by mouth 2 (two) times daily. 06/11/21   [provider]  Multiple Vitamins-Minerals (ONE-A-DAY WEIGHT SMART ADVANCE PO) Take 1 tablet by mouth daily. Centrum Silver    [provider]  nitroGLYCERIN (NITROSTAT) 0.4 MG SL tablet Place 0.4 mg under the tongue every 5 (five) minutes  as needed for chest pain.    [provider]  omeprazole (PRILOSEC) 40 MG capsule Take 1 capsule by mouth daily. 04/19/20   [provider]  [facility] ULTRA test strip USE AS DIRECTED 3 TIMES  DAILY 03/21/22   Carollee Herter, Alferd Apa, DO  potassium chloride SA (KLOR-CON M) 20 MEQ tablet Take 2 tablets (40 mEq total) by mouth daily. 08/06/22   Lowne Chase, Yvonne R, DO  PROAIR HFA 108 [phone] Base) MCG/ACT inhaler Inhale 1 puff into the lungs every 6 (six) hours as needed for wheezing or shortness of breath. 06/28/20   Ann Held, DO  rivaroxaban (XARELTO) 10 MG TABS tablet Take 10 mg by mouth daily.    [provider]  Semaglutide (OZEMPIC, 0.25 OR 0.5 MG/DOSE, [place]) Inject 0.25 mg into the skin once a week.    [provider]  SYMBICORT 80-4.5 MCG/ACT inhaler USE 2 INHALATIONS BY MOUTH DAILY 07/04/22   Carollee Herter, Alferd Apa, DO  tadalafil, PAH, (ADCIRCA) 20 MG tablet Take by mouth.    [provider]  topiramate (TOPAMAX) 50 MG tablet TAKE 1 TABLET BY MOUTH TWICE  DAILY 09/16/22   Cameron Sprang, MD  vitamin B-12 (CYANOCOBALAMIN) 1000 MCG tablet Take 1,000 mcg by mouth daily.    [provider]      Allergies    Bee venom and Garlic    Review of Systems   Review of Systems  Respiratory:  Negative for shortness of breath.   Cardiovascular:  Negative for chest pain.  All other systems reviewed and are negative.   Physical Exam Updated Vital Signs BP (!) 129/91   Pulse 71   Temp 98.3 F (36.8 C) (Oral)   Resp 16   Ht '5\' 9"'$  (1.753 m)   Wt 90.7 kg   SpO2 99%   BMI 29.53 kg/m  Physical Exam Vitals and nursing note reviewed.  Constitutional:      General: He is not in acute distress.    Appearance: Normal appearance.  Eyes:     General: No scleral icterus.    Extraocular Movements: Extraocular movements intact.  Cardiovascular:     Rate and Rhythm: Normal rate.  Pulmonary:     Effort: Pulmonary effort is normal. No respiratory distress.  Abdominal:     Palpations: Abdomen is soft. There is no mass.     Tenderness: There is no abdominal tenderness.  Musculoskeletal:        General: Normal range of motion.     Cervical back: Neck supple.     Comments: Pedal pulses intact.  No tenderness to palpation noted to bilateral calves or popliteal regions.  No overlying skin changes to lower extremities.  Mild erythema noted to right foot more so than left foot with tenderness to palpation noted to the area.  Normal flexion and extension of bilateral ankles.  Skin:    General: Skin is warm and dry.     Findings: No rash.  Neurological:     Mental Status: He  is alert.     Sensory: Sensation is intact.     Motor: Motor function is intact.  Psychiatric:        Behavior: Behavior normal.     ED Results / Procedures / Treatments   Labs (all labs ordered are listed, but only abnormal results are displayed) Labs Reviewed - No data to display  EKG None  Radiology US Venous Img Lower Bilateral (DVT)  Result Date: 10/20/2022  CLINICAL DATA:  Bilateral lower extremity swelling. EXAM: BILATERAL LOWER EXTREMITY VENOUS DOPPLER ULTRASOUND TECHNIQUE: Gray-scale sonography with compression, as well as color and duplex ultrasound, were performed to evaluate the deep venous system(s) from the level of the common femoral vein through the popliteal and proximal calf veins. COMPARISON:  None Available. FINDINGS: VENOUS Normal compressibility of the common femoral, superficial femoral, and popliteal veins, as well as the visualized calf veins. Visualized portions of profunda femoral vein and great saphenous vein unremarkable. No filling defects to suggest DVT on grayscale or color Doppler imaging. Doppler waveforms show normal direction of venous flow, normal respiratory plasticity and response to augmentation. Limited views of the contralateral common femoral vein are unremarkable. OTHER None. Limitations: none IMPRESSION: Negative. Electronically Signed   By: Lajean Manes M.D.   On: 10/20/2022 12:25    Procedures Procedures    Medications Ordered in ED Medications - No data to display  ED Course/ Medical Decision Making/ A&P                           Medical Decision Making Risk Prescription drug management.   Pt presents with concerns for bilateral feet swelling onset 3-4 days. Has a history of peripheral vascular disease. Notes that he has a history of gout that is typically in his back and this doesn't feel similar to him. Pt afebrile. On exam, pt with Pedal pulses intact.  No tenderness to palpation noted to bilateral calves or popliteal regions.   No overlying skin changes to lower extremities.  Mild erythema noted to right foot more so than left foot with tenderness to palpation noted to the area.  Normal flexion and extension of bilateral ankles. No acute cardiovascular, respiratory, abdominal exam findings. Differential diagnosis includes CHF exacerbation, DVT, cellulitis, fracture, dislocation, sprain, gout.   Additional history obtained:  Additional history obtained from Spouse/Significant Other   Imaging: I ordered imaging studies including  BLE DVT ultrasound I independently visualized and interpreted imaging which showed: no acute DVT I agree with the radiologist interpretation   Disposition: Presentation suspicious for likely cellulitis of the right lower extremity.. Doubt DVT at this time. Doubt gout at this time.  Doubt fracture, dislocation at this time.  Doubt CHF exacerbation.  After consideration of the diagnostic results and the patients response to treatment, I feel that the patient would benefit from Discharge home.  Patient sent with a prescription for Keflex.  Also instructed patient to follow-up with his vascular surgeon regarding today's ED visit.  Patient provided with information for on-call vascular surgeon for follow-up regarding today's ED visit.  Case discussed with attending who agrees with discharge treatment plan.  Supportive care measures and strict return precautions discussed with patient at bedside. Pt acknowledges and verbalizes understanding. Pt appears safe for discharge. Follow up as indicated in discharge paperwork.    This chart was dictated using voice recognition software, Dragon. Despite the best efforts of this provider to proofread and correct errors, errors may still occur which can change documentation meaning.   Final Clinical Impression(s) / ED Diagnoses Final diagnoses:  Bilateral swelling of feet  Cellulitis of right lower extremity    Rx / DC Orders ED Discharge Orders           Ordered    cephALEXin (KEFLEX) 500 MG capsule  4 times daily        10/20/22 1513  Gwynneth Fabio A, PA-C 10/20/22 1650    Gareth Morgan, MD 10/22/22 1029

## 2022-10-20 NOTE — Discharge Instructions (Signed)
You need to go to an ER for additional testing for your circulation to the legs

## 2022-10-20 NOTE — ED Triage Notes (Signed)
Pt reports redness and swelling to bil feet for a few days; no injury; sts he has some knots on both lower legs; sent here from [facility] for [place]

## 2022-10-20 NOTE — ED Notes (Addendum)
Error in charting.

## 2022-10-21 ENCOUNTER — Other Ambulatory Visit: Payer: Self-pay | Admitting: Family Medicine

## 2022-10-21 ENCOUNTER — Telehealth: Payer: Self-pay | Admitting: Family Medicine

## 2022-10-21 ENCOUNTER — Telehealth: Payer: Self-pay | Admitting: Emergency Medicine

## 2022-10-21 DIAGNOSIS — L03116 Cellulitis of left lower limb: Secondary | ICD-10-CM

## 2022-10-21 DIAGNOSIS — R600 Localized edema: Secondary | ICD-10-CM

## 2022-10-21 NOTE — Telephone Encounter (Signed)
Pt was seen at the ED this weekend for cellulitis. They recommended he needed to f/u with a vascular dr and they would like to know if they can get a referral placed for as soon as possible. Please advise.

## 2022-10-22 ENCOUNTER — Ambulatory Visit: Payer: Medicare Other | Admitting: Pharmacist

## 2022-10-22 DIAGNOSIS — R600 Localized edema: Secondary | ICD-10-CM

## 2022-10-22 DIAGNOSIS — J4520 Mild intermittent asthma, uncomplicated: Secondary | ICD-10-CM

## 2022-10-22 DIAGNOSIS — I2510 Atherosclerotic heart disease of native coronary artery without angina pectoris: Secondary | ICD-10-CM

## 2022-10-22 DIAGNOSIS — R310 Gross hematuria: Secondary | ICD-10-CM | POA: Diagnosis not present

## 2022-10-22 DIAGNOSIS — E1165 Type 2 diabetes mellitus with hyperglycemia: Secondary | ICD-10-CM

## 2022-10-22 DIAGNOSIS — Z86711 Personal history of pulmonary embolism: Secondary | ICD-10-CM

## 2022-10-22 NOTE — Progress Notes (Signed)
Pharmacy Note  10/22/2022 Name: ORAL REMACHE MRN: [id number] DOB: [birth year]  Subjective: Garrett [name] is a 71 y.o. year old male who is a primary care patient of Carollee Herter, Alferd Apa, DO. Clinical Pharmacist Practitioner referral was placed to assist with medication and diabetes management.    Engaged with patient by telephone for follow up visit today.  Hypertension / CHF / edema / cellulitis: Patient was seen in ED 10/20/2022 for edema and redness in legs. Diagnosed with cellulitis and started cephalexin '500mg'$  4 times a day for 5 days. Patient reports today that redness and swelling are improving. He denies shortness of breath. Continues to take metoprolol and furosemide daily.  Patient is aware that Dr Etter Sjogren place referral for vascular surgeon yesterday.  Type 2 DM: Patient reports blood glucose was ranging from 190 to 200 when he was taking just 0.'25mg'$  weekly of Ozempic. He increased to 0.'5mg'$  weekly about 2 weeks ago and now blood glucose has been lower between 140 and 150's. He reports occasional nausea and diarrhea but no change since he increased dose. Denies recent abdominal pain.  Mixed Hyperlipidemia / CAD:  Last LDL was 63 and Tg 200. Taking fenofibrate '160mg'$  daily and atorvastatin '80mg'$  daily.  Chronic anticoagulation / history of PE: Patient is taking Xarelto - dose was lowered a few months ago from '20mg'$  to '10mg'$  daily. Patient recently had fill and was still the '20mg'$  strength. He is taking 0.5 tablet = '10mg'$  daily Asthma: No recent exacerbations. Usign Symbicort '80mg'$  2 puffs daily. Rarely needs to use albuterol.   SDOH (Social Determinants of Health) assessments and interventions performed:  SDOH Interventions    Flowsheet Row Telephone from 10/10/2022 in [place] [facility] from 06/18/2022 in [place] at [place] [facility] from 08/30/2021 in [place]  at [place] [facility] from 08/07/2021 in [place] at [place] Interventions      Food Insecurity Interventions Intervention Not Indicated -- -- --  Transportation Interventions Intervention Not Indicated Intervention Not Indicated -- --  Utilities Interventions Intervention Not Indicated -- -- --  Financial Strain Interventions -- Other (Comment)  [assisted with PAP for Ozempic - good thru 12/22/2022,  PAP not available for Xarelto for 2023 for Medicare patients.] Other (Comment)  [completed PAP application today. patient to sign when in office later today.] Other (Comment)  [performed assessment for patient assistance programs for [employer], Symbicort and Xarelto. Patient is stil working and yearly household income if above patient assistance programs cut off.]        Objective: Review of patient status, including review of consultants reports, laboratory and other test data, was performed as part of comprehensive evaluation and provision of chronic care management services.   Lab Results  Component Value Date   CREATININE 1.13 09/06/2022   CREATININE 1.41 (H) 08/07/2022   CREATININE 1.34 07/10/2022    Lab Results  Component Value Date   HGBA1C 7.0 (H) 07/10/2022       Component Value Date/Time   CHOL 135 11/12/2021 1114   CHOL 162 07/18/2015 0744   TRIG 200.0 (H) 11/12/2021 1114   TRIG 110 07/18/2015 0744   HDL 32.00 (L) 11/12/2021 1114   HDL 41 07/18/2015 0744   CHOLHDL 4 11/12/2021 1114   VLDL 40.0 11/12/2021 1114   LDLCALC 63 11/12/2021 1114   LDLCALC 129 (H) 08/17/2020 3016  [place] 99 07/18/2015 0744   LDLDIRECT 104.0 02/14/2020 1025     Clinical ASCVD: Yes  The 10-year ASCVD risk score (Arnett DK, et al., 2019) is: 41.4%   Values used to calculate the score:     Age: 32 years     Sex: Male     Is Non-Hispanic African American: No     Diabetic: Yes     Tobacco smoker: No     Systolic Blood  Pressure: 129 mmHg     Is BP treated: Yes     HDL Cholesterol: 32 mg/dL     Total Cholesterol: 135 mg/dL    BP Readings from Last 3 Encounters:  10/20/22 129/84  10/20/22 130/86  09/06/22 137/79     Allergies  Allergen Reactions   Bee Venom Anaphylaxis   Garlic Swelling    Medications Reviewed Today     Reviewed by Cherre Robins, RPH-CPP (Pharmacist) on 10/22/22 at 1328  Med List Status: <None>   Medication Order Taking? Sig Documenting Provider Last Dose Status Informant  albuterol (PROVENTIL) (2.5 MG/3ML) 0.083% nebulizer solution [id number]  Take 3 mLs (2.5 mg total) by nebulization every 6 (six) hours as needed for wheezing or shortness of breath. Ann Held, DO  Active   allopurinol (ZYLOPRIM) 100 MG tablet [id number] Yes Take 1 tablet (100 mg total) by mouth daily. Ann Held, DO Taking Active   atorvastatin (LIPITOR) 80 MG tablet [id number] Yes Take 80 mg by mouth daily. [provider] Taking Active   celecoxib (CELEBREX) 200 MG capsule [id number] Yes TAKE 1 CAPSULE BY MOUTH  TWICE DAILY  Patient taking differently: 2 caps in AM   Carollee Herter, Kendrick Fries R, DO Taking Active   cephALEXin (KEFLEX) 500 MG capsule [id number] Yes Take 1 capsule (500 mg total) by mouth 4 (four) times daily. Blue, [place] A, PA-C Taking Active   fenofibrate 160 MG tablet [id number] Yes TAKE 1 TABLET BY MOUTH DAILY Ann Held, DO Taking Active   Ferrous Sulfate (IRON PO) [id number] Yes Take 1 tablet by mouth daily. [provider] Taking Active Self  fluticasone (FLONASE) 50 MCG/ACT nasal spray [id number] Yes Place 2 sprays into both nostrils daily. Ann Held, DO Taking Active Self  furosemide (LASIX) 40 MG tablet [id number] Yes Take 40 mg by mouth 2 (two) times daily. '40mg'$  in the morning  '40mg'$  in evening [provider] Taking Active Self  gabapentin (NEURONTIN) 100 MG capsule [id number] Yes Take 2 capsules (200 mg total) by mouth at  bedtime.  Patient taking differently: Take 100 mg by mouth at bedtime.   Ann Held, DO Taking Active   levocetirizine (XYZAL) 5 MG tablet [id number] Yes Take 1 tablet (5 mg total) by mouth every evening. Ann Held, DO Taking Active   meclizine (ANTIVERT) 25 MG tablet [id number] Yes Take 1 tablet (25 mg total) by mouth 3 (three) times daily as needed for dizziness. Ann Held, DO Taking Active Self  memantine (NAMENDA) 10 MG tablet [id number] Yes Take 10 mg by mouth 2 (two) times daily. [provider] Taking Active   metFORMIN (GLUCOPHAGE-XR) 500 MG 24 hr tablet [id number] Yes Take 1 tablet (500 mg total) by mouth daily with breakfast. Carollee Herter, Alferd Apa, DO Taking Active   metoprolol tartrate (LOPRESSOR) 50 MG tablet [id number] Yes Take 50 mg by mouth 2 (two) times daily. [provider] Taking Active Self  Multiple Vitamins-Minerals (ONE-A-DAY WEIGHT SMART ADVANCE PO)  [id number] Yes Take 1 tablet by mouth daily. Centrum Silver [provider] Taking Active Self  nitroGLYCERIN (NITROSTAT) 0.4 MG SL tablet [id number] Yes Place 0.4 mg under the tongue every 5 (five) minutes as needed for chest pain. [provider] Taking Active Self  omeprazole (PRILOSEC) 40 MG capsule [id number] Yes Take 1 capsule by mouth daily. [provider] Taking Active   [facility] ULTRA test strip [id number] Yes USE AS DIRECTED 3 TIMES  DAILY Carollee Herter, Alferd Apa, DO Taking Active   potassium chloride SA (KLOR-CON M) 20 MEQ tablet [id number] Yes Take 2 tablets (40 mEq total) by mouth daily. Carollee Herter, Alferd Apa, DO Taking Active   PROAIR HFA 108 [phone] Base) MCG/ACT inhaler [id number]  Inhale 1 puff into the lungs every 6 (six) hours as needed for wheezing or shortness of breath. Ann Held, DO  Active Self  rivaroxaban (XARELTO) 10 MG TABS tablet [id number] Yes Take 10 mg by mouth daily. [provider] Taking Active   Semaglutide  (OZEMPIC, 0.25 OR 0.5 MG/DOSE, [place]) [id number] Yes Inject 0.5 mg into the skin once a week. [provider] Taking Active   SYMBICORT 80-4.5 MCG/ACT inhaler [id number] Yes USE 2 INHALATIONS BY MOUTH DAILY Ann Held, DO Taking Active   tadalafil, PAH, (ADCIRCA) 20 MG tablet [id number] Yes Take by mouth. [provider] Taking Active   topiramate (TOPAMAX) 50 MG tablet [id number] Yes TAKE 1 TABLET BY MOUTH TWICE  DAILY Cameron Sprang, MD Taking Active   vitamin B-12 (CYANOCOBALAMIN) 1000 MCG tablet [id number] Yes Take 1,000 mcg by mouth daily. [provider] Taking Active Self            Patient Active Problem List   Diagnosis Date Noted   Uncontrolled type 2 diabetes mellitus with hyperglycemia ([place]) 06/07/2022   Hematuria 05/03/2022   Urinary tract infection with hematuria 04/19/2022   Weakness of both lower extremities 02/25/2022   Frequent falls 02/25/2022   Coronary artery disease involving native coronary artery of native heart without angina pectoris 02/17/2022   Acute recurrent ethmoidal sinusitis 02/17/2022   Sensorineural hearing loss (SNHL) of both ears 02/17/2022   DOE (dyspnea on exertion) 05/11/2021   Abnormal stress test 04/20/2021   S/P Nissen fundoplication (without gastrostomy tube) procedure 02/20/2021   Body mass index (BMI) 33.0-33.9, adult 11/03/2019   Laceration of right hand 08/02/2019   Acute pulmonary embolism ([place]) 01/07/2019   Chronic diastolic CHF (congestive heart failure) ([place]) 01/07/2019   Preventative health care 02/10/2018   Spondylolisthesis at L3-L4 level 10/28/2017   Cough variant asthma  vs uacs/ pseudoasthma 06/17/2017   Pulmonary embolism and infarction ([place]) 02/09/2017   Bilateral pulmonary embolism ([place]) 02/04/2017   Greater trochanteric bursitis of left hip 01/14/2017   Greater trochanteric bursitis of right hip 12/20/2016   Degenerative arthritis of knee, bilateral 10/08/2016   Upper airway cough  syndrome 03/22/2016   Multiple pulmonary nodules 03/22/2016   Headache disorder 01/04/2016   History of colonic polyps 04/11/2015   Lumbar stenosis with neurogenic claudication 11/14/2014   Spondylolysis of cervical region 02/25/2014   Lumbosacral spondylosis without myelopathy 01/06/2014   OSA (obstructive sleep apnea) 12/08/2013   SOB (shortness of breath) on exertion 11/04/2013   Morbid obesity due to excess calories ([place])    Venous insufficiency of leg 02/18/2012   Itching 02/18/2012   Mild dementia ([place]) 05/28/2011   Hyperlipidemia associated with type 2 diabetes mellitus ([place]) 05/27/2011   Controlled type 2 diabetes mellitus  with diabetic nephropathy ([place]) 04/10/2011   Gout 04/10/2011   Essential hypertension 04/10/2011   OA (osteoarthritis) 04/10/2011   GERD (gastroesophageal reflux disease) 04/10/2011   Migraine headache without aura 04/10/2011   Allergic rhinitis, cause unspecified 04/10/2011   Bee sting allergy 04/10/2011   Generalized osteoarthritis of multiple sites 04/10/2011   Hypertension associated with stage 2 chronic kidney disease due to type 2 diabetes mellitus ([place]) 04/10/2011     Medication Assistance:   Ozempic obtained through [employer] medication assistance program.  Enrollment ends 52/84/1324 Application for 4010 was mailed to patient 10/17/2022 by pharmacy tech medication assistance program team.    Assessment / Plan: Hypertension / CHF / edema / cellulitis: controlled, edema improving Continue to take furosemide and metoprolol Continue cephalexin until complete Type 2 DM: controlled per last A1c. Home blood glucose a little high but improving Recommended continue Ozempic 0.'5mg'$  weekly. Updated medication list with this does.  Patient should received mailed application for 2725 Novo Nordisk medication assistance program.  Continue to check blood glucose daily. Recommended he try to check blood glucose in evening a few times per month.  Reviewed blood  glucose goals. .  Mixed Hyperlipidemia / CAD: LDL at goal; Tg still above goal of < 150. Due to recheck lipids in the next 1 to 2 months. If Tg still elevated could consider adding Lovaza Continue atorvastatin and fenofibrate Chronic anticoagulation / history of PE:  Continue Xarelto '10mg'$  daily Asthma: controlled Contiue Symbicort '80mg'$  2 puffs daily.  Use albuterol as needed.   Health Maintenance - reminded patient to get flu vaccine and 2nd shingles vaccines.  Also discussed COVID vaccine.  Discussed annual eye exam. Patient has appointment scheduled for 11/2022   Follow Up:  Telephone follow up appointment with care management team member scheduled for:  2 to 3 months   Cherre Robins, PharmD Clinical Pharmacist [place] [phone]

## 2022-10-22 NOTE — Patient Instructions (Signed)
Mr. Geer It was a pleasure speaking with you today.  Below is a summary of your health goals and summary of our recent visit.    Hypertension / leg swelling / cellulitis:  Continue to take furosemide and metoprolol Continue cephalexin until complete Type 2 DM:  Continue Ozempic 0.'5mg'$  weekly.  You should received mailed application for 8527 Novo Nordisk medication assistance program in the next week.  Continue to check blood glucose daily.Try to check blood glucose in evening a few times per month.  Home blood glucose readings and reviewed goals  Fasting blood glucose goal (before meals) = 80 to 130 Blood glucose goal after a meal = less than 180   Mixed Hyperlipidemia / CAD: LDL at goal; Tg still above goal of < 150. Due to recheck lipids in the next 1 to 2 months. Continue atorvastatin and fenofibrate Chronic anticoagulation / history of  pulmonary embolism  Continue Xarelto '10mg'$  daily Asthma: controlled Contiue Symbicort '80mg'$  2 puffs daily.  Use albuterol as needed.   As always if you have any questions or concerns especially regarding medications, please feel free to contact me either at the phone number below or with a MyChart message.   Keep up the good work!  Cherre Robins, PharmD Clinical Pharmacist [facility] [phone] (direct line)  [phone] (main office number)

## 2022-10-25 DIAGNOSIS — R350 Frequency of micturition: Secondary | ICD-10-CM | POA: Diagnosis not present

## 2022-10-25 DIAGNOSIS — R300 Dysuria: Secondary | ICD-10-CM | POA: Diagnosis not present

## 2022-10-25 DIAGNOSIS — R310 Gross hematuria: Secondary | ICD-10-CM | POA: Diagnosis not present

## 2022-10-30 ENCOUNTER — Ambulatory Visit (HOSPITAL_BASED_OUTPATIENT_CLINIC_OR_DEPARTMENT_OTHER): Payer: Medicare Other

## 2022-10-30 ENCOUNTER — Telehealth: Payer: Self-pay | Admitting: Family Medicine

## 2022-10-30 NOTE — Telephone Encounter (Signed)
Pt called to ask two questions regarding the order for the chest CT that was put in:  If he is currently taking antibiotics, would that mess with the contrast for this CT? Considering that he had a chest CT back on 9.15.23, is this one necessary?  Please Advise.

## 2022-10-30 NOTE — Telephone Encounter (Signed)
Pt made aware. He will call to reschedule

## 2022-11-01 DIAGNOSIS — N3000 Acute cystitis without hematuria: Secondary | ICD-10-CM | POA: Diagnosis not present

## 2022-11-05 ENCOUNTER — Ambulatory Visit (HOSPITAL_BASED_OUTPATIENT_CLINIC_OR_DEPARTMENT_OTHER): Payer: Medicare Other

## 2022-11-07 ENCOUNTER — Encounter: Payer: Self-pay | Admitting: Family Medicine

## 2022-11-11 ENCOUNTER — Other Ambulatory Visit: Payer: Self-pay | Admitting: Family Medicine

## 2022-11-12 ENCOUNTER — Other Ambulatory Visit: Payer: Self-pay | Admitting: Family Medicine

## 2022-11-12 ENCOUNTER — Telehealth: Payer: Self-pay | Admitting: Internal Medicine

## 2022-11-12 DIAGNOSIS — R310 Gross hematuria: Secondary | ICD-10-CM | POA: Diagnosis not present

## 2022-11-12 NOTE — Telephone Encounter (Signed)
Faxed Ov notes to Santiago Glad at [place]. Nothing further needed.

## 2022-11-19 DIAGNOSIS — N3000 Acute cystitis without hematuria: Secondary | ICD-10-CM | POA: Diagnosis not present

## 2022-11-19 DIAGNOSIS — R310 Gross hematuria: Secondary | ICD-10-CM | POA: Diagnosis not present

## 2022-11-20 ENCOUNTER — Other Ambulatory Visit: Payer: Self-pay | Admitting: Family Medicine

## 2022-11-20 ENCOUNTER — Other Ambulatory Visit: Payer: Self-pay | Admitting: Neurology

## 2022-11-20 ENCOUNTER — Telehealth: Payer: Self-pay

## 2022-11-20 NOTE — Telephone Encounter (Signed)
Pt made aware that Ozempic has been delivered.

## 2022-11-22 ENCOUNTER — Other Ambulatory Visit: Payer: Self-pay | Admitting: Family Medicine

## 2022-11-22 NOTE — Telephone Encounter (Signed)
Pt came into office and picked up Ozempic.

## 2022-11-26 ENCOUNTER — Other Ambulatory Visit: Payer: Self-pay | Admitting: *Deleted

## 2022-11-26 DIAGNOSIS — H903 Sensorineural hearing loss, bilateral: Secondary | ICD-10-CM | POA: Diagnosis not present

## 2022-11-26 DIAGNOSIS — Z461 Encounter for fitting and adjustment of hearing aid: Secondary | ICD-10-CM | POA: Diagnosis not present

## 2022-11-26 DIAGNOSIS — I872 Venous insufficiency (chronic) (peripheral): Secondary | ICD-10-CM

## 2022-11-29 ENCOUNTER — Ambulatory Visit (INDEPENDENT_AMBULATORY_CARE_PROVIDER_SITE_OTHER): Payer: Medicare Other | Admitting: Podiatry

## 2022-11-29 ENCOUNTER — Encounter: Payer: Self-pay | Admitting: Podiatry

## 2022-11-29 VITALS — BP 142/80

## 2022-11-29 DIAGNOSIS — M79675 Pain in left toe(s): Secondary | ICD-10-CM | POA: Diagnosis not present

## 2022-11-29 DIAGNOSIS — E1121 Type 2 diabetes mellitus with diabetic nephropathy: Secondary | ICD-10-CM

## 2022-11-29 DIAGNOSIS — B351 Tinea unguium: Secondary | ICD-10-CM | POA: Diagnosis not present

## 2022-11-29 DIAGNOSIS — M79674 Pain in right toe(s): Secondary | ICD-10-CM

## 2022-11-29 DIAGNOSIS — I872 Venous insufficiency (chronic) (peripheral): Secondary | ICD-10-CM | POA: Diagnosis not present

## 2022-11-29 DIAGNOSIS — M129 Arthropathy, unspecified: Secondary | ICD-10-CM

## 2022-11-29 NOTE — Progress Notes (Signed)
This patient returns to my office for at risk foot care.  This patient requires this care by a professional since this patient will be at risk due to having diabetes and coagulation defect. This patient is unable to cut nails himself since the patient cannot reach his nails.These nails are painful walking and wearing shoes.  This patient presents for at risk foot care today.  General Appearance  Alert, conversant and in no acute stress.  Vascular  Dorsalis pedis and posterior tibial  pulses are palpable  bilaterally.  Capillary return is within normal limits  bilaterally. Temperature is within normal limits  bilaterally.  Neurologic  Senn-Weinstein monofilament wire test diminished/absent bilaterally. Muscle power within normal limits bilaterally.  Nails Thick disfigured discolored nails with subungual debris  from hallux to fifth toes bilaterally. No evidence of bacterial infection or drainage bilaterally.  Orthopedic  No limitations of motion  feet .  No crepitus or effusions noted.  No bony pathology or digital deformities noted.  Midfoot arthritis  B/L.  Skin  normotropic skin with no porokeratosis noted bilaterally.  No signs of infections or ulcers noted.     Onychomycosis  Pain in right toes  Pain in left toes  Consent was obtained for treatment procedures.   Mechanical debridement of nails 1-5  bilaterally performed with a nail nipper.  Filed with dremel without incident.    Return office visit    4  months                 Told patient to return for periodic foot care and evaluation due to potential at risk complications.   Gardiner Barefoot DPM

## 2022-12-03 ENCOUNTER — Ambulatory Visit: Payer: Medicare Other | Admitting: Vascular Surgery

## 2022-12-03 ENCOUNTER — Encounter: Payer: Self-pay | Admitting: Vascular Surgery

## 2022-12-03 ENCOUNTER — Ambulatory Visit (HOSPITAL_COMMUNITY)
Admission: RE | Admit: 2022-12-03 | Discharge: 2022-12-03 | Disposition: A | Discharge status: Home / Self Care | Payer: Medicare Other | Source: Ambulatory Visit | Attending: Vascular Surgery | Admitting: Vascular Surgery

## 2022-12-03 VITALS — BP 134/92 | HR 86 | Temp 98.0°F | Resp 16 | Ht 69.0 in | Wt 200.0 lb

## 2022-12-03 DIAGNOSIS — I872 Venous insufficiency (chronic) (peripheral): Secondary | ICD-10-CM | POA: Diagnosis not present
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
## 2022-12-05 ENCOUNTER — Other Ambulatory Visit: Payer: Self-pay | Admitting: Gastroenterology

## 2022-12-06 NOTE — Telephone Encounter (Signed)
This is a patient of Dr [name].  Please advise refills as you are DOD am.  Thank you

## 2022-12-10 ENCOUNTER — Ambulatory Visit (INDEPENDENT_AMBULATORY_CARE_PROVIDER_SITE_OTHER): Payer: Medicare Other | Admitting: Family Medicine

## 2022-12-10 ENCOUNTER — Encounter: Payer: Self-pay | Admitting: Family Medicine

## 2022-12-10 VITALS — BP 110/80 | HR 94 | Temp 97.5°F | Resp 18 | Ht 69.0 in | Wt 205.4 lb

## 2022-12-10 DIAGNOSIS — I2699 Other pulmonary embolism without acute cor pulmonale: Secondary | ICD-10-CM | POA: Diagnosis not present

## 2022-12-10 DIAGNOSIS — I872 Venous insufficiency (chronic) (peripheral): Secondary | ICD-10-CM

## 2022-12-10 DIAGNOSIS — E1169 Type 2 diabetes mellitus with other specified complication: Secondary | ICD-10-CM | POA: Diagnosis not present

## 2022-12-10 DIAGNOSIS — E785 Hyperlipidemia, unspecified: Secondary | ICD-10-CM

## 2022-12-10 DIAGNOSIS — E1165 Type 2 diabetes mellitus with hyperglycemia: Secondary | ICD-10-CM | POA: Diagnosis not present

## 2022-12-10 DIAGNOSIS — Z23 Encounter for immunization: Secondary | ICD-10-CM | POA: Diagnosis not present

## 2022-12-10 DIAGNOSIS — I10 Essential (primary) hypertension: Secondary | ICD-10-CM

## 2022-12-10 DIAGNOSIS — I2510 Atherosclerotic heart disease of native coronary artery without angina pectoris: Secondary | ICD-10-CM

## 2022-12-10 DIAGNOSIS — I5032 Chronic diastolic (congestive) heart failure: Secondary | ICD-10-CM

## 2022-12-10 LAB — MICROALBUMIN / CREATININE URINE RATIO
Creatinine,U: 30.6 mg/dL
Microalb Creat Ratio: 2.3 mg/g (ref 0.0–30.0)
Microalb, Ur: <0.7 mg/dL (ref 0.0–1.9)

## 2022-12-10 LAB — CBC WITH DIFFERENTIAL/PLATELET
Basophils Absolute: 0 10*3/uL (ref 0.0–0.1)
Basophils Relative: 0.4 % (ref 0.0–3.0)
Eosinophils Absolute: 0.2 10*3/uL (ref 0.0–0.7)
Eosinophils Relative: 1.8 % (ref 0.0–5.0)
HCT: 37.2 % — ABNORMAL LOW (ref 39.0–52.0)
Hemoglobin: 12.5 g/dL — ABNORMAL LOW (ref 13.0–17.0)
Lymphocytes Relative: 13.4 % (ref 12.0–46.0)
Lymphs Abs: 1.3 10*3/uL (ref 0.7–4.0)
MCHC: 33.7 g/dL (ref 30.0–36.0)
MCV: 89.8 fl (ref 78.0–100.0)
Monocytes Absolute: 0.8 10*3/uL (ref 0.1–1.0)
Monocytes Relative: 8.7 % (ref 3.0–12.0)
Neutro Abs: 7.2 10*3/uL (ref 1.4–7.7)
Neutrophils Relative %: 75.7 % (ref 43.0–77.0)
Platelets: 290 10*3/uL (ref 150.0–400.0)
RBC: 4.15 Mil/uL — ABNORMAL LOW (ref 4.22–5.81)
RDW: 16.1 % — ABNORMAL HIGH (ref 11.5–15.5)
WBC: 9.5 10*3/uL (ref 4.0–10.5)

## 2022-12-10 LAB — COMPREHENSIVE METABOLIC PANEL
ALT: 40 U/L (ref 0–53)
AST: 27 U/L (ref 0–37)
Albumin: 4.2 g/dL (ref 3.5–5.2)
Alkaline Phosphatase: 56 U/L (ref 39–117)
BUN: 14 mg/dL (ref 6–23)
CO2: 25 mEq/L (ref 19–32)
Calcium: 9.3 mg/dL (ref 8.4–10.5)
Chloride: 108 mEq/L (ref 96–112)
Creatinine, Ser: 1.15 mg/dL (ref 0.40–1.50)
GFR: 63.52 mL/min (ref >60.00)
Glucose, Bld: 131 mg/dL — ABNORMAL HIGH (ref 70–99)
Potassium: 3.7 mEq/L (ref 3.5–5.1)
Sodium: 143 mEq/L (ref 135–145)
Total Bilirubin: 0.4 mg/dL (ref 0.2–1.2)
Total Protein: 6.9 g/dL (ref 6.0–8.3)

## 2022-12-10 LAB — HEMOGLOBIN A1C: Hgb A1c MFr Bld: 6.7 % — ABNORMAL HIGH (ref 4.6–6.5)

## 2022-12-10 LAB — LIPID PANEL
Cholesterol: 149 mg/dL (ref 0–200)
HDL: 44.9 mg/dL (ref >39.00)
LDL Cholesterol: 70 mg/dL (ref 0–99)
NonHDL: 104.13
Total CHOL/HDL Ratio: 3
Triglycerides: 170 mg/dL — ABNORMAL HIGH (ref 0.0–149.0)
VLDL: 34 mg/dL (ref 0.0–40.0)

## 2022-12-10 NOTE — Assessment & Plan Note (Signed)
Per vascular  
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
## 2022-12-10 NOTE — Assessment & Plan Note (Signed)
Well controlled, no changes to meds. Encouraged heart healthy diet such as the DASH diet and exercise as tolerated.  °

## 2022-12-10 NOTE — Assessment & Plan Note (Signed)
hgba1c to be checked, minimize simple carbs. Increase exercise as tolerated. Continue current meds  

## 2022-12-10 NOTE — Assessment & Plan Note (Signed)
Tolerating statin, encouraged heart healthy diet, avoid trans fats, minimize simple carbs and saturated fats. Increase exercise as tolerated 

## 2022-12-18 DIAGNOSIS — H524 Presbyopia: Secondary | ICD-10-CM | POA: Diagnosis not present

## 2022-12-18 DIAGNOSIS — H47012 Ischemic optic neuropathy, left eye: Secondary | ICD-10-CM | POA: Diagnosis not present

## 2022-12-18 DIAGNOSIS — E119 Type 2 diabetes mellitus without complications: Secondary | ICD-10-CM | POA: Diagnosis not present

## 2022-12-18 DIAGNOSIS — H52223 Regular astigmatism, bilateral: Secondary | ICD-10-CM | POA: Diagnosis not present

## 2022-12-18 DIAGNOSIS — H5201 Hypermetropia, right eye: Secondary | ICD-10-CM | POA: Diagnosis not present

## 2022-12-18 DIAGNOSIS — D3131 Benign neoplasm of right choroid: Secondary | ICD-10-CM | POA: Diagnosis not present

## 2022-12-18 DIAGNOSIS — H47292 Other optic atrophy, left eye: Secondary | ICD-10-CM | POA: Diagnosis not present

## 2022-12-18 DIAGNOSIS — R310 Gross hematuria: Secondary | ICD-10-CM | POA: Diagnosis not present

## 2022-12-18 DIAGNOSIS — Z961 Presence of intraocular lens: Secondary | ICD-10-CM | POA: Diagnosis not present

## 2022-12-18 DIAGNOSIS — Z7984 Long term (current) use of oral hypoglycemic drugs: Secondary | ICD-10-CM | POA: Diagnosis not present

## 2022-12-18 DIAGNOSIS — N3000 Acute cystitis without hematuria: Secondary | ICD-10-CM | POA: Diagnosis not present

## 2022-12-19 DIAGNOSIS — R310 Gross hematuria: Secondary | ICD-10-CM | POA: Diagnosis not present

## 2022-12-23 HISTORY — PX: MOUTH SURGERY: SHX715

## 2023-01-03 ENCOUNTER — Ambulatory Visit: Payer: Medicare Other | Admitting: Neurology

## 2023-01-06 ENCOUNTER — Other Ambulatory Visit (HOSPITAL_COMMUNITY): Payer: Self-pay

## 2023-01-09 ENCOUNTER — Ambulatory Visit (INDEPENDENT_AMBULATORY_CARE_PROVIDER_SITE_OTHER): Payer: PPO

## 2023-01-09 ENCOUNTER — Other Ambulatory Visit (HOSPITAL_BASED_OUTPATIENT_CLINIC_OR_DEPARTMENT_OTHER): Payer: Self-pay

## 2023-01-09 DIAGNOSIS — E1121 Type 2 diabetes mellitus with diabetic nephropathy: Secondary | ICD-10-CM

## 2023-01-09 DIAGNOSIS — I872 Venous insufficiency (chronic) (peripheral): Secondary | ICD-10-CM | POA: Diagnosis not present

## 2023-01-09 MED ORDER — SHINGRIX 50 MCG/0.5ML IM SUSR
INTRAMUSCULAR | 0 refills | Status: DC
  Filled 2023-01-09: qty 1, 1d supply, fill #0

## 2023-01-09 NOTE — Progress Notes (Signed)
Patient presents today to pick up diabetic shoes and insoles.  Patient was dispensed 1 pair of diabetic shoes and 3 pairs of foam casted diabetic insoles.   He tried on the shoes with the insoles and the fit was satisfactory.   Will follow up next year for new order.

## 2023-01-24 ENCOUNTER — Ambulatory Visit: Payer: PPO | Admitting: Neurology

## 2023-01-24 ENCOUNTER — Other Ambulatory Visit: Payer: Self-pay

## 2023-01-24 DIAGNOSIS — M5416 Radiculopathy, lumbar region: Secondary | ICD-10-CM | POA: Diagnosis not present

## 2023-01-24 DIAGNOSIS — G629 Polyneuropathy, unspecified: Secondary | ICD-10-CM

## 2023-01-24 DIAGNOSIS — G43109 Migraine with aura, not intractable, without status migrainosus: Secondary | ICD-10-CM | POA: Diagnosis not present

## 2023-01-24 MED ORDER — TOPIRAMATE 50 MG PO TABS
50.0000 mg | ORAL_TABLET | Freq: Two times a day (BID) | ORAL | 3 refills | Status: DC
  Filled 2023-01-24: qty 200, 100d supply, fill #0
  Filled 2023-05-27 – 2023-08-20 (×2): qty 200, 100d supply, fill #1

## 2023-01-24 MED ORDER — TOPIRAMATE 50 MG PO TABS
50.0000 mg | ORAL_TABLET | Freq: Two times a day (BID) | ORAL | 3 refills | Status: DC
Start: 2023-01-24 — End: 2023-01-24
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
## 2023-01-24 NOTE — Patient Instructions (Addendum)
Always a pleasure to see you.  Schedule MRI lumbar spine without contrast at [facility]  2. Continue Topiramate '50mg'$  twice a day  3. Follow-up in 6 months, call for any changes

## 2023-01-27 ENCOUNTER — Other Ambulatory Visit (HOSPITAL_COMMUNITY): Payer: Self-pay

## 2023-01-27 ENCOUNTER — Other Ambulatory Visit: Payer: Self-pay

## 2023-01-27 DIAGNOSIS — I872 Venous insufficiency (chronic) (peripheral): Secondary | ICD-10-CM | POA: Diagnosis not present

## 2023-01-27 DIAGNOSIS — I5032 Chronic diastolic (congestive) heart failure: Secondary | ICD-10-CM | POA: Diagnosis not present

## 2023-01-27 DIAGNOSIS — R079 Chest pain, unspecified: Secondary | ICD-10-CM | POA: Diagnosis not present

## 2023-01-27 DIAGNOSIS — I110 Hypertensive heart disease with heart failure: Secondary | ICD-10-CM | POA: Diagnosis not present

## 2023-01-28 DIAGNOSIS — I5032 Chronic diastolic (congestive) heart failure: Secondary | ICD-10-CM | POA: Diagnosis not present

## 2023-01-28 DIAGNOSIS — I4510 Unspecified right bundle-branch block: Secondary | ICD-10-CM | POA: Diagnosis not present

## 2023-01-30 ENCOUNTER — Other Ambulatory Visit (HOSPITAL_BASED_OUTPATIENT_CLINIC_OR_DEPARTMENT_OTHER): Payer: Self-pay

## 2023-01-30 ENCOUNTER — Encounter: Payer: Self-pay | Admitting: Family Medicine

## 2023-01-30 MED ORDER — NITROGLYCERIN 0.4 MG SL SUBL: 0.4000 mg | SUBLINGUAL_TABLET | SUBLINGUAL | 1 refills | Status: DC | PRN

## 2023-01-30 MED ORDER — COMIRNATY 30 MCG/0.3ML IM SUSY
PREFILLED_SYRINGE | INTRAMUSCULAR | 0 refills | Status: DC
  Filled 2023-01-30: qty 0.3, 1d supply, fill #0

## 2023-01-30 MED ORDER — AREXVY 120 MCG/0.5ML IM SUSR
INTRAMUSCULAR | 0 refills | Status: DC
  Filled 2023-01-30: qty 1, 1d supply, fill #0

## 2023-01-30 NOTE — Addendum Note (Signed)
Addended by: Darreld Mclean on: 01/30/2023 06:33 PM   Modules accepted: Orders

## 2023-01-31 ENCOUNTER — Encounter: Payer: Self-pay | Admitting: Neurology

## 2023-01-31 DIAGNOSIS — M5416 Radiculopathy, lumbar region: Secondary | ICD-10-CM

## 2023-02-03 ENCOUNTER — Telehealth: Payer: Self-pay | Admitting: Family Medicine

## 2023-02-03 NOTE — Telephone Encounter (Signed)
Pt's ins is trying to verify if he has diabetes or congestive heart failure for ins benefits. Please advise. They stated they have faxed this but have not gotten a response. Confirmed fax # and they will be faxing this again.

## 2023-02-06 DIAGNOSIS — Z7901 Long term (current) use of anticoagulants: Secondary | ICD-10-CM | POA: Diagnosis not present

## 2023-02-06 DIAGNOSIS — I110 Hypertensive heart disease with heart failure: Secondary | ICD-10-CM | POA: Diagnosis not present

## 2023-02-06 DIAGNOSIS — F17220 Nicotine dependence, chewing tobacco, uncomplicated: Secondary | ICD-10-CM | POA: Diagnosis not present

## 2023-02-06 DIAGNOSIS — I25110 Atherosclerotic heart disease of native coronary artery with unstable angina pectoris: Secondary | ICD-10-CM | POA: Diagnosis not present

## 2023-02-06 DIAGNOSIS — I5032 Chronic diastolic (congestive) heart failure: Secondary | ICD-10-CM | POA: Diagnosis not present

## 2023-02-06 DIAGNOSIS — Z79899 Other long term (current) drug therapy: Secondary | ICD-10-CM | POA: Diagnosis not present

## 2023-02-06 DIAGNOSIS — I2510 Atherosclerotic heart disease of native coronary artery without angina pectoris: Secondary | ICD-10-CM | POA: Diagnosis not present

## 2023-02-12 ENCOUNTER — Telehealth: Payer: Self-pay | Admitting: Family Medicine

## 2023-02-12 NOTE — Telephone Encounter (Signed)
Pt dropped off document to be filled out by Pharmacist ([employer] [place] Program) Pt would like to have filled out and mailed when done. Document put at front office tray under pharmacist tray.

## 2023-02-12 NOTE — Telephone Encounter (Signed)
Pt dropped off document for provider to fill out (1 page [employer] Chronic Condition Verification) Pt would like document to be faxed when ready to [phone]. Document put at front office tray under providers name.

## 2023-02-13 DIAGNOSIS — L570 Actinic keratosis: Secondary | ICD-10-CM | POA: Diagnosis not present

## 2023-02-13 DIAGNOSIS — Z129 Encounter for screening for malignant neoplasm, site unspecified: Secondary | ICD-10-CM | POA: Diagnosis not present

## 2023-02-13 DIAGNOSIS — L814 Other melanin hyperpigmentation: Secondary | ICD-10-CM | POA: Diagnosis not present

## 2023-02-13 DIAGNOSIS — D1801 Hemangioma of skin and subcutaneous tissue: Secondary | ICD-10-CM | POA: Diagnosis not present

## 2023-02-13 DIAGNOSIS — Z85828 Personal history of other malignant neoplasm of skin: Secondary | ICD-10-CM | POA: Diagnosis not present

## 2023-02-13 DIAGNOSIS — L821 Other seborrheic keratosis: Secondary | ICD-10-CM | POA: Diagnosis not present

## 2023-02-13 NOTE — Telephone Encounter (Signed)
Form completed, signed, and faxed.

## 2023-02-14 ENCOUNTER — Other Ambulatory Visit: Payer: Self-pay | Admitting: Family Medicine

## 2023-02-14 ENCOUNTER — Other Ambulatory Visit (HOSPITAL_COMMUNITY): Payer: Self-pay

## 2023-02-14 DIAGNOSIS — R059 Cough, unspecified: Secondary | ICD-10-CM

## 2023-02-14 MED ORDER — POTASSIUM CHLORIDE CRYS ER 20 MEQ PO TBCR
40.0000 meq | EXTENDED_RELEASE_TABLET | Freq: Every day | ORAL | 0 refills | Status: DC
  Filled 2023-02-14: qty 180, 90d supply, fill #0

## 2023-02-14 MED ORDER — LEVOCETIRIZINE DIHYDROCHLORIDE 5 MG PO TABS
5.0000 mg | ORAL_TABLET | Freq: Every evening | ORAL | 0 refills | Status: DC
  Filled 2023-02-14: qty 90, 90d supply, fill #0

## 2023-02-15 ENCOUNTER — Other Ambulatory Visit (HOSPITAL_COMMUNITY): Payer: Self-pay

## 2023-02-16 ENCOUNTER — Ambulatory Visit
Admission: RE | Admit: 2023-02-16 | Discharge: 2023-02-16 | Disposition: A | Discharge status: Home / Self Care | Payer: PPO | Source: Ambulatory Visit | Attending: Neurology | Admitting: Neurology

## 2023-02-16 DIAGNOSIS — M48061 Spinal stenosis, lumbar region without neurogenic claudication: Secondary | ICD-10-CM | POA: Diagnosis not present

## 2023-02-16 DIAGNOSIS — M5416 Radiculopathy, lumbar region: Secondary | ICD-10-CM

## 2023-02-18 NOTE — Telephone Encounter (Signed)
Reviewed medication assistance program application for [employer]. Completed provider and prescription information. Forwarded to PCP for review and signature.

## 2023-02-21 ENCOUNTER — Other Ambulatory Visit: Payer: Self-pay

## 2023-02-21 DIAGNOSIS — M5416 Radiculopathy, lumbar region: Secondary | ICD-10-CM

## 2023-02-26 ENCOUNTER — Telehealth: Payer: Self-pay | Admitting: Pharmacist

## 2023-02-26 NOTE — Telephone Encounter (Signed)
Received patient's application for [employer] medication assistance program for [employer]. Reviewed and completed provider portion. Provided reviewed and signed. Faxed application and income documents to Rx assist team attn Anette Riedel, CPhT.

## 2023-02-28 ENCOUNTER — Telehealth: Payer: Self-pay

## 2023-02-28 ENCOUNTER — Encounter: Payer: Self-pay | Admitting: Podiatry

## 2023-02-28 ENCOUNTER — Ambulatory Visit: Payer: PPO | Admitting: Podiatry

## 2023-02-28 DIAGNOSIS — B351 Tinea unguium: Secondary | ICD-10-CM | POA: Diagnosis not present

## 2023-02-28 DIAGNOSIS — M79675 Pain in left toe(s): Secondary | ICD-10-CM

## 2023-02-28 DIAGNOSIS — M79674 Pain in right toe(s): Secondary | ICD-10-CM | POA: Diagnosis not present

## 2023-02-28 DIAGNOSIS — E1121 Type 2 diabetes mellitus with diabetic nephropathy: Secondary | ICD-10-CM

## 2023-02-28 NOTE — Progress Notes (Signed)
This patient returns to my office for at risk foot care.  This patient requires this care by a professional since this patient will be at risk due to having diabetes and coagulation defect. This patient is unable to cut nails himself since the patient cannot reach his nails.These nails are painful walking and wearing shoes.  This patient presents for at risk foot care today. ? ?General Appearance  Alert, conversant and in no acute stress. ? ?Vascular  Dorsalis pedis and posterior tibial  pulses are palpable  bilaterally.  Capillary return is within normal limits  bilaterally. Temperature is within normal limits  bilaterally. ? ?Neurologic  Senn-Weinstein monofilament wire test diminished/absent bilaterally. Muscle power within normal limits bilaterally. ? ?Nails Thick disfigured discolored nails with subungual debris  from hallux to fifth toes bilaterally. No evidence of bacterial infection or drainage bilaterally. ? ?Orthopedic  No limitations of motion  feet .  No crepitus or effusions noted.  No bony pathology or digital deformities noted.  Midfoot arthritis  B/L. ? ?Skin  normotropic skin with no porokeratosis noted bilaterally.  No signs of infections or ulcers noted.    ? ?Onychomycosis  Pain in right toes  Pain in left toes ? ?Consent was obtained for treatment procedures.   Mechanical debridement of nails 1-5  bilaterally performed with a nail nipper.  Filed with dremel without incident.  ? ? ?Return office visit    4  months                 Told patient to return for periodic foot care and evaluation due to potential at risk complications. ? ? ?Rowen Wilmer DPM   ?

## 2023-02-28 NOTE — Telephone Encounter (Signed)
Submitted application for OZEMPIC to [place] for patient assistance.   Phone: [place] [facility] Patient Advocate [phone]

## 2023-03-02 DIAGNOSIS — S32028A Other fracture of second lumbar vertebra, initial encounter for closed fracture: Secondary | ICD-10-CM | POA: Diagnosis not present

## 2023-03-02 DIAGNOSIS — Z7985 Long-term (current) use of injectable non-insulin antidiabetic drugs: Secondary | ICD-10-CM | POA: Diagnosis not present

## 2023-03-02 DIAGNOSIS — D649 Anemia, unspecified: Secondary | ICD-10-CM | POA: Diagnosis not present

## 2023-03-02 DIAGNOSIS — Z9621 Cochlear implant status: Secondary | ICD-10-CM | POA: Diagnosis not present

## 2023-03-02 DIAGNOSIS — R079 Chest pain, unspecified: Secondary | ICD-10-CM | POA: Diagnosis not present

## 2023-03-02 DIAGNOSIS — S0083XA Contusion of other part of head, initial encounter: Secondary | ICD-10-CM | POA: Diagnosis not present

## 2023-03-02 DIAGNOSIS — E1149 Type 2 diabetes mellitus with other diabetic neurological complication: Secondary | ICD-10-CM | POA: Diagnosis not present

## 2023-03-02 DIAGNOSIS — Z7984 Long term (current) use of oral hypoglycemic drugs: Secondary | ICD-10-CM | POA: Diagnosis not present

## 2023-03-02 DIAGNOSIS — R531 Weakness: Secondary | ICD-10-CM | POA: Diagnosis not present

## 2023-03-02 DIAGNOSIS — S82121A Displaced fracture of lateral condyle of right tibia, initial encounter for closed fracture: Secondary | ICD-10-CM | POA: Diagnosis not present

## 2023-03-02 DIAGNOSIS — R109 Unspecified abdominal pain: Secondary | ICD-10-CM | POA: Diagnosis not present

## 2023-03-02 DIAGNOSIS — G4700 Insomnia, unspecified: Secondary | ICD-10-CM | POA: Diagnosis not present

## 2023-03-02 DIAGNOSIS — S82144A Nondisplaced bicondylar fracture of right tibia, initial encounter for closed fracture: Secondary | ICD-10-CM | POA: Diagnosis not present

## 2023-03-02 DIAGNOSIS — Z743 Need for continuous supervision: Secondary | ICD-10-CM | POA: Diagnosis not present

## 2023-03-02 DIAGNOSIS — M1A9XX0 Chronic gout, unspecified, without tophus (tophi): Secondary | ICD-10-CM | POA: Diagnosis not present

## 2023-03-02 DIAGNOSIS — S32038A Other fracture of third lumbar vertebra, initial encounter for closed fracture: Secondary | ICD-10-CM | POA: Diagnosis not present

## 2023-03-02 DIAGNOSIS — I5032 Chronic diastolic (congestive) heart failure: Secondary | ICD-10-CM | POA: Diagnosis not present

## 2023-03-02 DIAGNOSIS — Z7901 Long term (current) use of anticoagulants: Secondary | ICD-10-CM | POA: Diagnosis not present

## 2023-03-02 DIAGNOSIS — Z87891 Personal history of nicotine dependence: Secondary | ICD-10-CM | POA: Diagnosis not present

## 2023-03-02 DIAGNOSIS — M79671 Pain in right foot: Secondary | ICD-10-CM | POA: Diagnosis not present

## 2023-03-02 DIAGNOSIS — M25551 Pain in right hip: Secondary | ICD-10-CM | POA: Diagnosis not present

## 2023-03-02 DIAGNOSIS — S0091XA Abrasion of unspecified part of head, initial encounter: Secondary | ICD-10-CM | POA: Diagnosis not present

## 2023-03-02 DIAGNOSIS — S300XXA Contusion of lower back and pelvis, initial encounter: Secondary | ICD-10-CM | POA: Diagnosis not present

## 2023-03-02 DIAGNOSIS — K661 Hemoperitoneum: Secondary | ICD-10-CM | POA: Diagnosis not present

## 2023-03-02 DIAGNOSIS — S32029A Unspecified fracture of second lumbar vertebra, initial encounter for closed fracture: Secondary | ICD-10-CM | POA: Diagnosis not present

## 2023-03-02 DIAGNOSIS — Z86711 Personal history of pulmonary embolism: Secondary | ICD-10-CM | POA: Diagnosis not present

## 2023-03-02 DIAGNOSIS — Z981 Arthrodesis status: Secondary | ICD-10-CM | POA: Diagnosis not present

## 2023-03-02 DIAGNOSIS — F0390 Unspecified dementia without behavioral disturbance: Secondary | ICD-10-CM | POA: Diagnosis not present

## 2023-03-02 DIAGNOSIS — F02A18 Dementia in other diseases classified elsewhere, mild, with other behavioral disturbance: Secondary | ICD-10-CM | POA: Diagnosis not present

## 2023-03-02 DIAGNOSIS — S82124A Nondisplaced fracture of lateral condyle of right tibia, initial encounter for closed fracture: Secondary | ICD-10-CM | POA: Diagnosis not present

## 2023-03-02 DIAGNOSIS — T07XXXA Unspecified multiple injuries, initial encounter: Secondary | ICD-10-CM | POA: Diagnosis not present

## 2023-03-02 DIAGNOSIS — M7989 Other specified soft tissue disorders: Secondary | ICD-10-CM | POA: Diagnosis not present

## 2023-03-02 DIAGNOSIS — Z041 Encounter for examination and observation following transport accident: Secondary | ICD-10-CM | POA: Diagnosis not present

## 2023-03-02 DIAGNOSIS — S32039A Unspecified fracture of third lumbar vertebra, initial encounter for closed fracture: Secondary | ICD-10-CM | POA: Diagnosis not present

## 2023-03-02 DIAGNOSIS — S82141A Displaced bicondylar fracture of right tibia, initial encounter for closed fracture: Secondary | ICD-10-CM | POA: Diagnosis not present

## 2023-03-02 DIAGNOSIS — S3600XA Unspecified injury of spleen, initial encounter: Secondary | ICD-10-CM | POA: Diagnosis not present

## 2023-03-02 DIAGNOSIS — S3993XA Unspecified injury of pelvis, initial encounter: Secondary | ICD-10-CM | POA: Diagnosis not present

## 2023-03-02 DIAGNOSIS — K219 Gastro-esophageal reflux disease without esophagitis: Secondary | ICD-10-CM | POA: Diagnosis not present

## 2023-03-02 DIAGNOSIS — F0280 Dementia in other diseases classified elsewhere without behavioral disturbance: Secondary | ICD-10-CM | POA: Diagnosis not present

## 2023-03-02 DIAGNOSIS — M79601 Pain in right arm: Secondary | ICD-10-CM | POA: Diagnosis not present

## 2023-03-02 DIAGNOSIS — E119 Type 2 diabetes mellitus without complications: Secondary | ICD-10-CM | POA: Diagnosis not present

## 2023-03-02 DIAGNOSIS — S82221A Displaced transverse fracture of shaft of right tibia, initial encounter for closed fracture: Secondary | ICD-10-CM | POA: Diagnosis not present

## 2023-03-02 DIAGNOSIS — D62 Acute posthemorrhagic anemia: Secondary | ICD-10-CM | POA: Diagnosis not present

## 2023-03-02 DIAGNOSIS — R208 Other disturbances of skin sensation: Secondary | ICD-10-CM | POA: Diagnosis not present

## 2023-03-02 DIAGNOSIS — I110 Hypertensive heart disease with heart failure: Secondary | ICD-10-CM | POA: Diagnosis not present

## 2023-03-02 DIAGNOSIS — G309 Alzheimer's disease, unspecified: Secondary | ICD-10-CM | POA: Diagnosis not present

## 2023-03-02 DIAGNOSIS — E1169 Type 2 diabetes mellitus with other specified complication: Secondary | ICD-10-CM | POA: Diagnosis not present

## 2023-03-02 DIAGNOSIS — E1121 Type 2 diabetes mellitus with diabetic nephropathy: Secondary | ICD-10-CM | POA: Diagnosis not present

## 2023-03-02 DIAGNOSIS — R918 Other nonspecific abnormal finding of lung field: Secondary | ICD-10-CM | POA: Diagnosis not present

## 2023-03-02 DIAGNOSIS — D6862 Lupus anticoagulant syndrome: Secondary | ICD-10-CM | POA: Diagnosis not present

## 2023-03-02 DIAGNOSIS — S36899A Unspecified injury of other intra-abdominal organs, initial encounter: Secondary | ICD-10-CM | POA: Diagnosis not present

## 2023-03-02 DIAGNOSIS — Z7951 Long term (current) use of inhaled steroids: Secondary | ICD-10-CM | POA: Diagnosis not present

## 2023-03-02 DIAGNOSIS — S32009A Unspecified fracture of unspecified lumbar vertebra, initial encounter for closed fracture: Secondary | ICD-10-CM | POA: Diagnosis not present

## 2023-03-02 DIAGNOSIS — G301 Alzheimer's disease with late onset: Secondary | ICD-10-CM | POA: Diagnosis not present

## 2023-03-02 DIAGNOSIS — J984 Other disorders of lung: Secondary | ICD-10-CM | POA: Diagnosis not present

## 2023-03-03 DIAGNOSIS — S82124A Nondisplaced fracture of lateral condyle of right tibia, initial encounter for closed fracture: Secondary | ICD-10-CM | POA: Diagnosis not present

## 2023-03-03 DIAGNOSIS — K661 Hemoperitoneum: Secondary | ICD-10-CM | POA: Diagnosis not present

## 2023-03-03 DIAGNOSIS — S82141A Displaced bicondylar fracture of right tibia, initial encounter for closed fracture: Secondary | ICD-10-CM | POA: Diagnosis not present

## 2023-03-04 DIAGNOSIS — M7989 Other specified soft tissue disorders: Secondary | ICD-10-CM | POA: Diagnosis not present

## 2023-03-04 DIAGNOSIS — S32029A Unspecified fracture of second lumbar vertebra, initial encounter for closed fracture: Secondary | ICD-10-CM | POA: Diagnosis not present

## 2023-03-04 DIAGNOSIS — S32039A Unspecified fracture of third lumbar vertebra, initial encounter for closed fracture: Secondary | ICD-10-CM | POA: Diagnosis not present

## 2023-03-04 DIAGNOSIS — Z7901 Long term (current) use of anticoagulants: Secondary | ICD-10-CM | POA: Diagnosis not present

## 2023-03-04 DIAGNOSIS — R208 Other disturbances of skin sensation: Secondary | ICD-10-CM | POA: Diagnosis not present

## 2023-03-04 DIAGNOSIS — S36899A Unspecified injury of other intra-abdominal organs, initial encounter: Secondary | ICD-10-CM | POA: Diagnosis not present

## 2023-03-04 DIAGNOSIS — J984 Other disorders of lung: Secondary | ICD-10-CM | POA: Diagnosis not present

## 2023-03-04 DIAGNOSIS — Z86711 Personal history of pulmonary embolism: Secondary | ICD-10-CM | POA: Diagnosis not present

## 2023-03-04 DIAGNOSIS — M79671 Pain in right foot: Secondary | ICD-10-CM | POA: Diagnosis not present

## 2023-03-05 ENCOUNTER — Other Ambulatory Visit (HOSPITAL_COMMUNITY): Payer: Self-pay

## 2023-03-05 DIAGNOSIS — S32039A Unspecified fracture of third lumbar vertebra, initial encounter for closed fracture: Secondary | ICD-10-CM | POA: Diagnosis not present

## 2023-03-05 DIAGNOSIS — J984 Other disorders of lung: Secondary | ICD-10-CM | POA: Diagnosis not present

## 2023-03-05 DIAGNOSIS — S32029A Unspecified fracture of second lumbar vertebra, initial encounter for closed fracture: Secondary | ICD-10-CM | POA: Diagnosis not present

## 2023-03-05 DIAGNOSIS — Z86711 Personal history of pulmonary embolism: Secondary | ICD-10-CM | POA: Diagnosis not present

## 2023-03-06 ENCOUNTER — Other Ambulatory Visit (HOSPITAL_BASED_OUTPATIENT_CLINIC_OR_DEPARTMENT_OTHER): Payer: Medicare Other

## 2023-03-06 DIAGNOSIS — J984 Other disorders of lung: Secondary | ICD-10-CM | POA: Diagnosis not present

## 2023-03-06 DIAGNOSIS — Z86711 Personal history of pulmonary embolism: Secondary | ICD-10-CM | POA: Diagnosis not present

## 2023-03-06 DIAGNOSIS — S32029A Unspecified fracture of second lumbar vertebra, initial encounter for closed fracture: Secondary | ICD-10-CM | POA: Diagnosis not present

## 2023-03-06 DIAGNOSIS — S32039A Unspecified fracture of third lumbar vertebra, initial encounter for closed fracture: Secondary | ICD-10-CM | POA: Diagnosis not present

## 2023-03-07 DIAGNOSIS — Z86711 Personal history of pulmonary embolism: Secondary | ICD-10-CM | POA: Diagnosis not present

## 2023-03-07 DIAGNOSIS — J984 Other disorders of lung: Secondary | ICD-10-CM | POA: Diagnosis not present

## 2023-03-07 DIAGNOSIS — S32029A Unspecified fracture of second lumbar vertebra, initial encounter for closed fracture: Secondary | ICD-10-CM | POA: Diagnosis not present

## 2023-03-07 DIAGNOSIS — S32039A Unspecified fracture of third lumbar vertebra, initial encounter for closed fracture: Secondary | ICD-10-CM | POA: Diagnosis not present

## 2023-03-08 DIAGNOSIS — Z7901 Long term (current) use of anticoagulants: Secondary | ICD-10-CM | POA: Diagnosis not present

## 2023-03-08 DIAGNOSIS — J984 Other disorders of lung: Secondary | ICD-10-CM | POA: Diagnosis not present

## 2023-03-08 DIAGNOSIS — S32029A Unspecified fracture of second lumbar vertebra, initial encounter for closed fracture: Secondary | ICD-10-CM | POA: Diagnosis not present

## 2023-03-08 DIAGNOSIS — G309 Alzheimer's disease, unspecified: Secondary | ICD-10-CM | POA: Diagnosis not present

## 2023-03-08 DIAGNOSIS — Z86711 Personal history of pulmonary embolism: Secondary | ICD-10-CM | POA: Diagnosis not present

## 2023-03-08 DIAGNOSIS — S300XXA Contusion of lower back and pelvis, initial encounter: Secondary | ICD-10-CM | POA: Diagnosis not present

## 2023-03-08 DIAGNOSIS — S32039A Unspecified fracture of third lumbar vertebra, initial encounter for closed fracture: Secondary | ICD-10-CM | POA: Diagnosis not present

## 2023-03-08 DIAGNOSIS — F0280 Dementia in other diseases classified elsewhere without behavioral disturbance: Secondary | ICD-10-CM | POA: Diagnosis not present

## 2023-03-09 DIAGNOSIS — F0280 Dementia in other diseases classified elsewhere without behavioral disturbance: Secondary | ICD-10-CM | POA: Diagnosis not present

## 2023-03-09 DIAGNOSIS — S300XXA Contusion of lower back and pelvis, initial encounter: Secondary | ICD-10-CM | POA: Diagnosis not present

## 2023-03-09 DIAGNOSIS — Z86711 Personal history of pulmonary embolism: Secondary | ICD-10-CM | POA: Diagnosis not present

## 2023-03-09 DIAGNOSIS — S32029A Unspecified fracture of second lumbar vertebra, initial encounter for closed fracture: Secondary | ICD-10-CM | POA: Diagnosis not present

## 2023-03-09 DIAGNOSIS — J984 Other disorders of lung: Secondary | ICD-10-CM | POA: Diagnosis not present

## 2023-03-09 DIAGNOSIS — G4700 Insomnia, unspecified: Secondary | ICD-10-CM | POA: Diagnosis not present

## 2023-03-09 DIAGNOSIS — G309 Alzheimer's disease, unspecified: Secondary | ICD-10-CM | POA: Diagnosis not present

## 2023-03-09 DIAGNOSIS — Z7901 Long term (current) use of anticoagulants: Secondary | ICD-10-CM | POA: Diagnosis not present

## 2023-03-09 DIAGNOSIS — S32039A Unspecified fracture of third lumbar vertebra, initial encounter for closed fracture: Secondary | ICD-10-CM | POA: Diagnosis not present

## 2023-03-10 DIAGNOSIS — S32039A Unspecified fracture of third lumbar vertebra, initial encounter for closed fracture: Secondary | ICD-10-CM | POA: Diagnosis not present

## 2023-03-10 DIAGNOSIS — F0390 Unspecified dementia without behavioral disturbance: Secondary | ICD-10-CM | POA: Diagnosis not present

## 2023-03-10 DIAGNOSIS — S36899A Unspecified injury of other intra-abdominal organs, initial encounter: Secondary | ICD-10-CM | POA: Diagnosis not present

## 2023-03-10 DIAGNOSIS — J984 Other disorders of lung: Secondary | ICD-10-CM | POA: Diagnosis not present

## 2023-03-10 DIAGNOSIS — S32029A Unspecified fracture of second lumbar vertebra, initial encounter for closed fracture: Secondary | ICD-10-CM | POA: Diagnosis not present

## 2023-03-10 DIAGNOSIS — Z7901 Long term (current) use of anticoagulants: Secondary | ICD-10-CM | POA: Diagnosis not present

## 2023-03-10 DIAGNOSIS — G4700 Insomnia, unspecified: Secondary | ICD-10-CM | POA: Diagnosis not present

## 2023-03-10 DIAGNOSIS — G309 Alzheimer's disease, unspecified: Secondary | ICD-10-CM | POA: Diagnosis not present

## 2023-03-10 DIAGNOSIS — Z86711 Personal history of pulmonary embolism: Secondary | ICD-10-CM | POA: Diagnosis not present

## 2023-03-11 DIAGNOSIS — Z86711 Personal history of pulmonary embolism: Secondary | ICD-10-CM | POA: Diagnosis not present

## 2023-03-11 DIAGNOSIS — S36899A Unspecified injury of other intra-abdominal organs, initial encounter: Secondary | ICD-10-CM | POA: Diagnosis not present

## 2023-03-11 DIAGNOSIS — J984 Other disorders of lung: Secondary | ICD-10-CM | POA: Diagnosis not present

## 2023-03-11 DIAGNOSIS — S32029A Unspecified fracture of second lumbar vertebra, initial encounter for closed fracture: Secondary | ICD-10-CM | POA: Diagnosis not present

## 2023-03-11 DIAGNOSIS — Z7901 Long term (current) use of anticoagulants: Secondary | ICD-10-CM | POA: Diagnosis not present

## 2023-03-11 DIAGNOSIS — G4700 Insomnia, unspecified: Secondary | ICD-10-CM | POA: Diagnosis not present

## 2023-03-11 DIAGNOSIS — S32039A Unspecified fracture of third lumbar vertebra, initial encounter for closed fracture: Secondary | ICD-10-CM | POA: Diagnosis not present

## 2023-03-12 DIAGNOSIS — J984 Other disorders of lung: Secondary | ICD-10-CM | POA: Diagnosis not present

## 2023-03-12 DIAGNOSIS — S32039A Unspecified fracture of third lumbar vertebra, initial encounter for closed fracture: Secondary | ICD-10-CM | POA: Diagnosis not present

## 2023-03-12 DIAGNOSIS — G4700 Insomnia, unspecified: Secondary | ICD-10-CM | POA: Diagnosis not present

## 2023-03-12 DIAGNOSIS — S32029A Unspecified fracture of second lumbar vertebra, initial encounter for closed fracture: Secondary | ICD-10-CM | POA: Diagnosis not present

## 2023-03-12 DIAGNOSIS — S36899A Unspecified injury of other intra-abdominal organs, initial encounter: Secondary | ICD-10-CM | POA: Diagnosis not present

## 2023-03-12 DIAGNOSIS — Z7901 Long term (current) use of anticoagulants: Secondary | ICD-10-CM | POA: Diagnosis not present

## 2023-03-12 DIAGNOSIS — Z86711 Personal history of pulmonary embolism: Secondary | ICD-10-CM | POA: Diagnosis not present

## 2023-03-12 NOTE — Telephone Encounter (Signed)
Received notification from [place] regarding approval for [place]. Patient assistance approved from 03//20/2024 to 02/27/2024.  Phone: [phone]  Approval letter in scanned in media of chart will be shipped to office 10-14 days  [facility] Patient [facility]ocate [phone]

## 2023-03-13 ENCOUNTER — Telehealth: Payer: Self-pay

## 2023-03-13 DIAGNOSIS — T07XXXA Unspecified multiple injuries, initial encounter: Secondary | ICD-10-CM | POA: Diagnosis not present

## 2023-03-13 DIAGNOSIS — S32029A Unspecified fracture of second lumbar vertebra, initial encounter for closed fracture: Secondary | ICD-10-CM | POA: Diagnosis not present

## 2023-03-13 DIAGNOSIS — G4700 Insomnia, unspecified: Secondary | ICD-10-CM | POA: Diagnosis not present

## 2023-03-13 DIAGNOSIS — Z7901 Long term (current) use of anticoagulants: Secondary | ICD-10-CM | POA: Diagnosis not present

## 2023-03-13 DIAGNOSIS — S32039A Unspecified fracture of third lumbar vertebra, initial encounter for closed fracture: Secondary | ICD-10-CM | POA: Diagnosis not present

## 2023-03-13 DIAGNOSIS — R531 Weakness: Secondary | ICD-10-CM | POA: Diagnosis not present

## 2023-03-13 DIAGNOSIS — M25551 Pain in right hip: Secondary | ICD-10-CM | POA: Diagnosis not present

## 2023-03-13 DIAGNOSIS — S36899A Unspecified injury of other intra-abdominal organs, initial encounter: Secondary | ICD-10-CM | POA: Diagnosis not present

## 2023-03-13 DIAGNOSIS — Z86711 Personal history of pulmonary embolism: Secondary | ICD-10-CM | POA: Diagnosis not present

## 2023-03-13 DIAGNOSIS — J984 Other disorders of lung: Secondary | ICD-10-CM | POA: Diagnosis not present

## 2023-03-13 DIAGNOSIS — M79601 Pain in right arm: Secondary | ICD-10-CM | POA: Diagnosis not present

## 2023-03-13 DIAGNOSIS — Z743 Need for continuous supervision: Secondary | ICD-10-CM | POA: Diagnosis not present

## 2023-03-13 NOTE — Telephone Encounter (Signed)
Spoke with patient. Ozempic has arrived. Pt states he will send his wife to pick it up.

## 2023-03-14 ENCOUNTER — Telehealth: Payer: Self-pay | Admitting: Family Medicine

## 2023-03-14 ENCOUNTER — Ambulatory Visit (HOSPITAL_BASED_OUTPATIENT_CLINIC_OR_DEPARTMENT_OTHER): Payer: PPO
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
## 2023-03-17 ENCOUNTER — Ambulatory Visit (HOSPITAL_COMMUNITY): Payer: PPO

## 2023-03-17 ENCOUNTER — Encounter: Payer: Self-pay | Admitting: Family Medicine

## 2023-03-18 ENCOUNTER — Telehealth: Payer: Self-pay

## 2023-03-18 ENCOUNTER — Encounter: Payer: Self-pay | Admitting: Family Medicine

## 2023-03-18 DIAGNOSIS — S32009D Unspecified fracture of unspecified lumbar vertebra, subsequent encounter for fracture with routine healing: Secondary | ICD-10-CM

## 2023-03-18 DIAGNOSIS — T1490XA Injury, unspecified, initial encounter: Secondary | ICD-10-CM

## 2023-03-18 DIAGNOSIS — S82141D Displaced bicondylar fracture of right tibia, subsequent encounter for closed fracture with routine healing: Secondary | ICD-10-CM

## 2023-03-18 NOTE — Telephone Encounter (Signed)
-----   Message from Cameron Sprang, MD sent at 03/16/2023  6:33 PM EDT ----- Pls let him know the lumbar MRI showed arthritis changes in the lower back at L5, that may cause pinched nerves right more than left. Pls have him f/u with his back surgeon with these results, can forward results to his surgeon, thanks

## 2023-03-18 NOTE — Telephone Encounter (Signed)
Pt called an informed that lumbar MRI showed arthritis changes in the lower back at L5, that may cause pinched nerves right more than left. Pls have him f/u with his back surgeon with these results. [place] scheduled called and 2nd MRI was cancelled, pt ortho Dr will have to order if a 2nd one is needed; MRI from [facility] neuro was ordered twice,

## 2023-03-18 NOTE — Telephone Encounter (Signed)
Pt was made aware via Mychart

## 2023-04-01 ENCOUNTER — Ambulatory Visit (INDEPENDENT_AMBULATORY_CARE_PROVIDER_SITE_OTHER): Payer: PPO | Admitting: Family Medicine

## 2023-04-01 ENCOUNTER — Encounter: Payer: Self-pay | Admitting: Family Medicine

## 2023-04-01 VITALS — BP 120/84 | HR 77 | Temp 98.2°F | Resp 18 | Ht 69.0 in | Wt 202.4 lb

## 2023-04-01 DIAGNOSIS — S82401A Unspecified fracture of shaft of right fibula, initial encounter for closed fracture: Secondary | ICD-10-CM | POA: Diagnosis not present

## 2023-04-01 DIAGNOSIS — S82201A Unspecified fracture of shaft of right tibia, initial encounter for closed fracture: Secondary | ICD-10-CM | POA: Diagnosis not present

## 2023-04-01 DIAGNOSIS — M542 Cervicalgia: Secondary | ICD-10-CM | POA: Diagnosis not present
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
## 2023-04-02 ENCOUNTER — Ambulatory Visit (HOSPITAL_BASED_OUTPATIENT_CLINIC_OR_DEPARTMENT_OTHER)
Admission: RE | Admit: 2023-04-02 | Discharge: 2023-04-02 | Disposition: A | Discharge status: Home / Self Care | Payer: PPO | Source: Ambulatory Visit | Attending: Family Medicine | Admitting: Family Medicine

## 2023-04-02 DIAGNOSIS — R911 Solitary pulmonary nodule: Secondary | ICD-10-CM | POA: Diagnosis not present

## 2023-04-02 DIAGNOSIS — S2241XA Multiple fractures of ribs, right side, initial encounter for closed fracture: Secondary | ICD-10-CM | POA: Diagnosis not present

## 2023-04-05 ENCOUNTER — Encounter (HOSPITAL_COMMUNITY): Payer: Self-pay

## 2023-04-07 ENCOUNTER — Ambulatory Visit (HOSPITAL_COMMUNITY)
Admission: RE | Admit: 2023-04-07 | Discharge: 2023-04-07 | Disposition: A | Discharge status: Home / Self Care | Payer: PPO | Source: Ambulatory Visit | Attending: Family Medicine | Admitting: Family Medicine

## 2023-04-07 DIAGNOSIS — M4802 Spinal stenosis, cervical region: Secondary | ICD-10-CM | POA: Insufficient documentation

## 2023-04-07 DIAGNOSIS — M542 Cervicalgia: Secondary | ICD-10-CM | POA: Diagnosis not present

## 2023-04-07 DIAGNOSIS — X58XXXA Exposure to other specified factors, initial encounter: Secondary | ICD-10-CM | POA: Diagnosis not present

## 2023-04-07 DIAGNOSIS — G8929 Other chronic pain: Secondary | ICD-10-CM | POA: Diagnosis not present

## 2023-04-08 ENCOUNTER — Other Ambulatory Visit (INDEPENDENT_AMBULATORY_CARE_PROVIDER_SITE_OTHER): Payer: PPO

## 2023-04-08 ENCOUNTER — Ambulatory Visit: Payer: PPO | Admitting: Orthopaedic Surgery

## 2023-04-08 DIAGNOSIS — S82121A Displaced fracture of lateral condyle of right tibia, initial encounter for closed fracture: Secondary | ICD-10-CM
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
## 2023-04-09 ENCOUNTER — Ambulatory Visit (HOSPITAL_BASED_OUTPATIENT_CLINIC_OR_DEPARTMENT_OTHER)
Admission: RE | Admit: 2023-04-09 | Discharge: 2023-04-09 | Disposition: A | Discharge status: Home / Self Care | Payer: PPO | Source: Ambulatory Visit | Attending: Family Medicine | Admitting: Family Medicine

## 2023-04-09 DIAGNOSIS — Y939 Activity, unspecified: Secondary | ICD-10-CM | POA: Insufficient documentation

## 2023-04-09 DIAGNOSIS — S82401A Unspecified fracture of shaft of right fibula, initial encounter for closed fracture: Secondary | ICD-10-CM | POA: Insufficient documentation

## 2023-04-09 DIAGNOSIS — Y929 Unspecified place or not applicable: Secondary | ICD-10-CM | POA: Insufficient documentation

## 2023-04-09 DIAGNOSIS — Z87891 Personal history of nicotine dependence: Secondary | ICD-10-CM | POA: Insufficient documentation

## 2023-04-09 DIAGNOSIS — M85832 Other specified disorders of bone density and structure, left forearm: Secondary | ICD-10-CM | POA: Diagnosis not present

## 2023-04-09 DIAGNOSIS — S82201A Unspecified fracture of shaft of right tibia, initial encounter for closed fracture: Secondary | ICD-10-CM | POA: Diagnosis not present

## 2023-04-10 ENCOUNTER — Other Ambulatory Visit: Payer: Self-pay | Admitting: Family Medicine

## 2023-04-10 ENCOUNTER — Encounter: Payer: Self-pay | Admitting: Family Medicine

## 2023-04-10 DIAGNOSIS — M5030 Other cervical disc degeneration, unspecified cervical region: Secondary | ICD-10-CM

## 2023-04-11 ENCOUNTER — Other Ambulatory Visit: Payer: Self-pay

## 2023-04-11 ENCOUNTER — Other Ambulatory Visit (HOSPITAL_COMMUNITY): Payer: Self-pay

## 2023-04-11 MED ORDER — METFORMIN HCL ER 500 MG PO TB24
500.0000 mg | ORAL_TABLET | Freq: Every day | ORAL | 1 refills | Status: DC
  Filled 2023-04-11: qty 90, 90d supply, fill #0
  Filled 2023-06-07: qty 90, 90d supply, fill #1

## 2023-04-11 MED ORDER — OMEPRAZOLE 40 MG PO CPDR
40.0000 mg | DELAYED_RELEASE_CAPSULE | Freq: Two times a day (BID) | ORAL | 1 refills | Status: DC
  Filled 2023-04-11: qty 180, 90d supply, fill #0
  Filled 2023-06-30 – 2023-07-05 (×3): qty 180, 90d supply, fill #1

## 2023-04-14 ENCOUNTER — Encounter: Payer: Self-pay | Admitting: Family Medicine

## 2023-04-15 ENCOUNTER — Other Ambulatory Visit: Payer: Self-pay | Admitting: Family Medicine

## 2023-04-15 ENCOUNTER — Telehealth: Payer: Self-pay | Admitting: Family Medicine

## 2023-04-15 MED ORDER — CELECOXIB 200 MG PO CAPS: 200.0000 mg | ORAL_CAPSULE | Freq: Two times a day (BID) | ORAL | 1 refills | Status: DC

## 2023-04-15 NOTE — Telephone Encounter (Signed)
Pt would like a call from pharmacist regarding medications

## 2023-04-17 ENCOUNTER — Other Ambulatory Visit (HOSPITAL_COMMUNITY): Payer: Self-pay

## 2023-04-17 ENCOUNTER — Other Ambulatory Visit (HOSPITAL_COMMUNITY): Payer: PPO

## 2023-04-17 ENCOUNTER — Other Ambulatory Visit: Payer: Self-pay | Admitting: Pharmacist

## 2023-04-17 MED ORDER — CELECOXIB 200 MG PO CAPS
200.0000 mg | ORAL_CAPSULE | Freq: Two times a day (BID) | ORAL | 1 refills | Status: DC
  Filled 2023-04-17: qty 200, 100d supply, fill #0

## 2023-04-17 NOTE — Telephone Encounter (Addendum)
Patient called. He is having trouble getting his celebrex from [facility].  Looks like it was sent to [facility]. Patient prefer [facility] Outpatient pharmacy. Called to cancel Rx at [facility] and will resent to [facility] Outpatient Rx.   Seen Rx encounter.

## 2023-04-17 NOTE — Progress Notes (Signed)
04/17/2023 Name: Garrett [name] MRN: [id number] DOB: 1951-06-25  Chief Complaint  Patient presents with   Medication Problem    Garrett [name] is a 72 y.o. year old male who presented for a telephone visit.   They were referred to the pharmacist by  self  for assistance with medication problema.   Patient is requesting help getting Celebrex. He was suppose to get filled for mailing with [facility] but they have not received yet. Looks like Rx was sent to [facility]  He also has question about referral for [facility] for back pain. He would prefer to see Dr Amada Jupiter with [facility].    Medication Access/Adherence  Current Pharmacy:  [facility] [address] [place] - [address] [place] [address] Phone: [phone] Fax: [phone]  [facility] [address] [place] [address] Phone: [phone] Fax: [phone]   Patient reports affordability concerns with their medications: No  Patient reports access/transportation concerns to their pharmacy: No  Patient reports adherence concerns with their medications:  Yes  unable to get refill  Objective:  Lab Results  Component Value Date   WBC 9.5 12/10/2022   HGB 12.5 (L) 12/10/2022   HCT 37.2 (L) 12/10/2022   MCV 89.8 12/10/2022   PLT 290.0 12/10/2022     Medications Reviewed Today     Reviewed by Donato Schultz, DO (Physician) on 04/01/23 at 1418  Med List Status: <None>   Medication Order Taking? Sig Documenting Provider Last Dose Status Informant  albuterol (PROVENTIL) (2.5 MG/3ML) 0.083% nebulizer solution [id number] Yes Take 3 mLs (2.5 mg total) by nebulization every 6 (six) hours as needed for wheezing or shortness of breath. Seabron Spates R, DO Taking Active   allopurinol (ZYLOPRIM) 100 MG tablet [id number] Yes Take 1 tablet (100 mg total) by mouth daily. Donato Schultz, DO Taking Active    atorvastatin (LIPITOR) 80 MG tablet [id number] Yes Take 80 mg by mouth daily. [provider] Taking Active   celecoxib (CELEBREX) 200 MG capsule [id number] Yes TAKE 1 CAPSULE BY MOUTH  TWICE DAILY  Patient taking differently: 2 caps in AM   Donato Schultz, DO Taking Active     Discontinued 04/01/23 1347 (Completed Course)   Patient not taking:  Discontinued 04/01/23 1347 (Completed Course)   fenofibrate 160 MG tablet [id number] Yes TAKE 1 TABLET BY MOUTH DAILY Donato Schultz, DO Taking Active   Ferrous Sulfate (IRON PO) [id number] Yes Take 1 tablet by mouth daily. [provider] Taking Active Self  fluticasone (FLONASE) 50 MCG/ACT nasal spray [id number] Yes Place 2 sprays into both nostrils daily. Donato Schultz, DO Taking Active Self  furosemide (LASIX) 40 MG tablet [id number] Yes Take 40 mg by mouth 2 (two) times daily.  in the morning   in evening [provider] Taking Active Self  gabapentin (NEURONTIN) 100 MG capsule [id number] Yes Take 2 capsules (200 mg total) by mouth at bedtime.  Patient taking differently: Take 100 mg by mouth at bedtime.   Donato Schultz, DO Taking Active   levocetirizine (XYZAL) 5 MG tablet [id number] Yes Take 1 tablet (5 mg total) by mouth every evening. Donato Schultz, DO Taking Active   meclizine (ANTIVERT) 25 MG tablet [id number] Yes Take 1 tablet (25 mg total) by mouth 3 (three) times daily as needed for dizziness. Donato Schultz, DO  Taking Active Self  memantine (NAMENDA) 10 MG tablet [id number] Yes Take 2 tablets (20 mg total) by mouth daily. Donato Schultz, DO Taking Active   metFORMIN (GLUCOPHAGE-XR) 500 MG 24 hr tablet [id number] Yes Take 1 tablet (500 mg total) by mouth daily with breakfast. Zola Button, Grayling Congress, DO Taking Active   metoprolol tartrate (LOPRESSOR) 50 MG tablet [id number] Yes Take 50 mg by mouth 2 (two) times daily. [provider] Taking Active Self   Multiple Vitamins-Minerals (ONE-A-DAY WEIGHT SMART ADVANCE PO) [id number] Yes Take 1 tablet by mouth daily. Centrum Silver [provider] Taking Active Self  nitroGLYCERIN (NITROSTAT) 0.4 MG SL tablet [id number] Yes Place 1 tablet (0.4 mg total) under the tongue every 5 (five) minutes as needed for chest pain. Copland, Gwenlyn Found, MD Taking Active   omeprazole (PRILOSEC) 40 MG capsule [id number] Yes TAKE 1 CAPSULE BY MOUTH SHORTLY  BEFORE BREAKFAST AND 1 CAPSULE  BY MOUTH SHORTLY BEFORE DINNER  MEAL Iva Boop, MD Taking Active   [facility] ULTRA test strip [id number] Yes USE AS DIRECTED 3 TIMES  DAILY Zola Button, Grayling Congress, DO Taking Active   potassium chloride SA (KLOR-CON M) 20 MEQ tablet [id number] Yes Take 2 tablets (40 mEq total) by mouth daily. Zola Button, Grayling Congress, DO Taking Active   PROAIR HFA 108 [id number] Base) MCG/ACT inhaler [id number] Yes Inhale 1 puff into the lungs every 6 (six) hours as needed for wheezing or shortness of breath. Donato Schultz, DO Taking Active Self  rivaroxaban (XARELTO) 10 MG TABS tablet [id number] Yes Take 10 mg by mouth daily. [provider] Taking Active   Patient not taking:  Discontinued 04/01/23 1347 (Completed Course)   Semaglutide (OZEMPIC, 0.25 OR 0.5 MG/DOSE, [place]) [id number] Yes Inject 0.5 mg into the skin once a week. [provider] Taking Active   SYMBICORT 80-4.5 MCG/ACT inhaler [id number] Yes USE 2 INHALATIONS BY MOUTH DAILY Donato Schultz, DO Taking Active   tadalafil, PAH, (ADCIRCA) 20 MG tablet [id number] Yes Take by mouth. [provider] Taking Active   topiramate (TOPAMAX) 50 MG tablet [id number] Yes Take 1 tablet (50 mg total) by mouth 2 (two) times daily. Van Clines, MD Taking Active   vitamin B-12 (CYANOCOBALAMIN) 1000 MCG tablet [id number] Yes Take 1,000 mcg by mouth daily. [provider] Taking Active Self  Zoster Vaccine Adjuvanted [facility]) injection [id number] Yes Inject into the muscle.  Judyann Munson, MD Taking Active               Assessment/Plan:   Medication Management: - called [facility] to cancel Rx filled there 2 days ago - verified patient had not picked up yet.  - Sent Rx for celebrex to [facility].  Meds ordered this encounter  Medications   celecoxib (CELEBREX) 200 MG capsule    Sig: Take 1 capsule (200 mg total) by mouth 2 (two) times daily.    Dispense:  200 capsule    Refill:  1    [facility] filled 2 days ago but pt preferred [facility] mail order. [facility] is reversing their fill.   - Also messaged our referral department - Hardie Lora) regarding change in patient preference for neurosurgery consult to Dr Amada Jupiter with [facility].    Henrene Pastor, PharmD Clinical Pharmacist [facility]

## 2023-04-18 ENCOUNTER — Other Ambulatory Visit (HOSPITAL_COMMUNITY): Payer: Self-pay

## 2023-04-18 ENCOUNTER — Other Ambulatory Visit: Payer: Self-pay

## 2023-04-25 DIAGNOSIS — Z961 Presence of intraocular lens: Secondary | ICD-10-CM | POA: Diagnosis not present

## 2023-04-25 DIAGNOSIS — H40003 Preglaucoma, unspecified, bilateral: Secondary | ICD-10-CM | POA: Diagnosis not present

## 2023-04-25 DIAGNOSIS — H47012 Ischemic optic neuropathy, left eye: Secondary | ICD-10-CM | POA: Diagnosis not present
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
## 2023-04-28 ENCOUNTER — Ambulatory Visit: Payer: PPO | Attending: Family Medicine | Admitting: Physical Therapy

## 2023-04-28 ENCOUNTER — Encounter: Payer: Self-pay | Admitting: Physical Therapy

## 2023-04-28 DIAGNOSIS — S82401A Unspecified fracture of shaft of right fibula, initial encounter for closed fracture: Secondary | ICD-10-CM | POA: Diagnosis not present

## 2023-04-28 DIAGNOSIS — R2689 Other abnormalities of gait and mobility: Secondary | ICD-10-CM | POA: Diagnosis not present

## 2023-04-28 DIAGNOSIS — M25661 Stiffness of right knee, not elsewhere classified: Secondary | ICD-10-CM

## 2023-04-28 DIAGNOSIS — R2681 Unsteadiness on feet: Secondary | ICD-10-CM

## 2023-04-28 DIAGNOSIS — S82201A Unspecified fracture of shaft of right tibia, initial encounter for closed fracture: Secondary | ICD-10-CM | POA: Insufficient documentation

## 2023-04-28 DIAGNOSIS — M6281 Muscle weakness (generalized): Secondary | ICD-10-CM | POA: Insufficient documentation

## 2023-04-29 DIAGNOSIS — I517 Cardiomegaly: Secondary | ICD-10-CM | POA: Diagnosis not present

## 2023-04-30 ENCOUNTER — Other Ambulatory Visit: Payer: Self-pay | Admitting: Family Medicine

## 2023-05-01 ENCOUNTER — Other Ambulatory Visit (HOSPITAL_COMMUNITY): Payer: Self-pay

## 2023-05-01 ENCOUNTER — Other Ambulatory Visit: Payer: Self-pay

## 2023-05-01 ENCOUNTER — Ambulatory Visit: Payer: PPO

## 2023-05-01 ENCOUNTER — Encounter: Payer: Self-pay | Admitting: Pharmacist

## 2023-05-01 DIAGNOSIS — R2681 Unsteadiness on feet: Secondary | ICD-10-CM

## 2023-05-01 DIAGNOSIS — M6281 Muscle weakness (generalized): Secondary | ICD-10-CM

## 2023-05-01 DIAGNOSIS — R2689 Other abnormalities of gait and mobility: Secondary | ICD-10-CM

## 2023-05-01 DIAGNOSIS — M25661 Stiffness of right knee, not elsewhere classified: Secondary | ICD-10-CM

## 2023-05-01 MED ORDER — RIVAROXABAN 10 MG PO TABS
10.0000 mg | ORAL_TABLET | Freq: Every day | ORAL | 1 refills | Status: DC
  Filled 2023-05-01 – 2023-05-06 (×2): qty 90, 90d supply, fill #0
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
## 2023-05-02 ENCOUNTER — Other Ambulatory Visit: Payer: Self-pay

## 2023-05-05 DIAGNOSIS — E1142 Type 2 diabetes mellitus with diabetic polyneuropathy: Secondary | ICD-10-CM | POA: Diagnosis not present

## 2023-05-05 DIAGNOSIS — M5030 Other cervical disc degeneration, unspecified cervical region: Secondary | ICD-10-CM | POA: Diagnosis not present

## 2023-05-05 DIAGNOSIS — M47816 Spondylosis without myelopathy or radiculopathy, lumbar region: Secondary | ICD-10-CM | POA: Diagnosis not present

## 2023-05-06 ENCOUNTER — Other Ambulatory Visit (HOSPITAL_COMMUNITY): Payer: Self-pay

## 2023-05-06 ENCOUNTER — Encounter: Payer: Self-pay | Admitting: Physical Therapy

## 2023-05-06 ENCOUNTER — Other Ambulatory Visit: Payer: Self-pay

## 2023-05-06 ENCOUNTER — Ambulatory Visit: Payer: PPO | Admitting: Physical Therapy

## 2023-05-06 DIAGNOSIS — R2681 Unsteadiness on feet: Secondary | ICD-10-CM

## 2023-05-06 DIAGNOSIS — M6281 Muscle weakness (generalized): Secondary | ICD-10-CM | POA: Diagnosis not present

## 2023-05-06 DIAGNOSIS — R2689 Other abnormalities of gait and mobility: Secondary | ICD-10-CM

## 2023-05-06 DIAGNOSIS — M25661 Stiffness of right knee, not elsewhere classified: Secondary | ICD-10-CM
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
## 2023-05-08 ENCOUNTER — Ambulatory Visit: Payer: PPO

## 2023-05-08 DIAGNOSIS — M6281 Muscle weakness (generalized): Secondary | ICD-10-CM

## 2023-05-08 DIAGNOSIS — R2681 Unsteadiness on feet: Secondary | ICD-10-CM

## 2023-05-08 DIAGNOSIS — R2689 Other abnormalities of gait and mobility: Secondary | ICD-10-CM

## 2023-05-08 DIAGNOSIS — M25661 Stiffness of right knee, not elsewhere classified: Secondary | ICD-10-CM
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
## 2023-05-12 ENCOUNTER — Other Ambulatory Visit: Payer: Self-pay

## 2023-05-12 ENCOUNTER — Encounter: Payer: Self-pay | Admitting: Physical Therapy

## 2023-05-12 ENCOUNTER — Other Ambulatory Visit: Payer: Self-pay | Admitting: Family Medicine

## 2023-05-12 ENCOUNTER — Ambulatory Visit: Payer: PPO | Admitting: Physical Therapy

## 2023-05-12 ENCOUNTER — Other Ambulatory Visit (HOSPITAL_COMMUNITY): Payer: Self-pay

## 2023-05-12 DIAGNOSIS — M6281 Muscle weakness (generalized): Secondary | ICD-10-CM | POA: Diagnosis not present

## 2023-05-12 DIAGNOSIS — I2089 Other forms of angina pectoris: Secondary | ICD-10-CM | POA: Diagnosis not present

## 2023-05-12 DIAGNOSIS — R2689 Other abnormalities of gait and mobility: Secondary | ICD-10-CM

## 2023-05-12 DIAGNOSIS — I110 Hypertensive heart disease with heart failure: Secondary | ICD-10-CM | POA: Diagnosis not present

## 2023-05-12 DIAGNOSIS — I5032 Chronic diastolic (congestive) heart failure: Secondary | ICD-10-CM | POA: Diagnosis not present

## 2023-05-12 DIAGNOSIS — Z87891 Personal history of nicotine dependence: Secondary | ICD-10-CM | POA: Diagnosis not present

## 2023-05-12 DIAGNOSIS — M25661 Stiffness of right knee, not elsewhere classified: Secondary | ICD-10-CM

## 2023-05-12 DIAGNOSIS — I83899 Varicose veins of unspecified lower extremities with other complications: Secondary | ICD-10-CM | POA: Diagnosis not present

## 2023-05-12 DIAGNOSIS — R2681 Unsteadiness on feet: Secondary | ICD-10-CM

## 2023-05-12 DIAGNOSIS — R059 Cough, unspecified: Secondary | ICD-10-CM

## 2023-05-12 DIAGNOSIS — Z7901 Long term (current) use of anticoagulants: Secondary | ICD-10-CM | POA: Diagnosis not present

## 2023-05-12 MED ORDER — FUROSEMIDE 40 MG PO TABS
40.0000 mg | ORAL_TABLET | Freq: Two times a day (BID) | ORAL | 3 refills | Status: DC
  Filled 2023-05-12: qty 180, 90d supply, fill #0

## 2023-05-12 MED ORDER — LEVOCETIRIZINE DIHYDROCHLORIDE 5 MG PO TABS
5.0000 mg | ORAL_TABLET | Freq: Every evening | ORAL | 0 refills | Status: DC
Start: 2023-05-12 — End: 2023-08-20
  Filled 2023-05-12 – 2023-05-27 (×2): qty 90, 90d supply, fill #0

## 2023-05-12 MED ORDER — ATORVASTATIN CALCIUM 80 MG PO TABS
80.0000 mg | ORAL_TABLET | Freq: Every day | ORAL | 3 refills | Status: DC
  Filled 2023-05-12: qty 90, 90d supply, fill #0

## 2023-05-12 MED ORDER — POTASSIUM CHLORIDE CRYS ER 20 MEQ PO TBCR
40.0000 meq | EXTENDED_RELEASE_TABLET | Freq: Every day | ORAL | 0 refills | Status: DC
  Filled 2023-05-12 – 2023-05-27 (×2): qty 180, 90d supply, fill #0

## 2023-05-12 MED ORDER — METOPROLOL TARTRATE 75 MG PO TABS
75.0000 mg | ORAL_TABLET | Freq: Two times a day (BID) | ORAL | 3 refills | Status: DC
  Filled 2023-05-12: qty 180, 90d supply, fill #0

## 2023-05-13 ENCOUNTER — Other Ambulatory Visit: Payer: Self-pay

## 2023-05-15 ENCOUNTER — Ambulatory Visit: Payer: PPO | Admitting: Physical Therapy

## 2023-05-15 ENCOUNTER — Encounter: Payer: Self-pay | Admitting: Physical Therapy

## 2023-05-15 DIAGNOSIS — M6281 Muscle weakness (generalized): Secondary | ICD-10-CM

## 2023-05-15 DIAGNOSIS — R2681 Unsteadiness on feet: Secondary | ICD-10-CM
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
## 2023-05-20 ENCOUNTER — Ambulatory Visit (INDEPENDENT_AMBULATORY_CARE_PROVIDER_SITE_OTHER): Payer: PPO | Admitting: Orthopaedic Surgery

## 2023-05-20 ENCOUNTER — Ambulatory Visit: Payer: PPO | Admitting: Physical Therapy

## 2023-05-20 ENCOUNTER — Other Ambulatory Visit (INDEPENDENT_AMBULATORY_CARE_PROVIDER_SITE_OTHER): Payer: PPO

## 2023-05-20 DIAGNOSIS — S82121A Displaced fracture of lateral condyle of right tibia, initial encounter for closed fracture: Secondary | ICD-10-CM

## 2023-05-22 ENCOUNTER — Ambulatory Visit: Payer: PPO | Admitting: Physical Therapy

## 2023-05-26 ENCOUNTER — Ambulatory Visit: Payer: PPO | Attending: Family Medicine | Admitting: Physical Therapy

## 2023-05-26 ENCOUNTER — Encounter: Payer: Self-pay | Admitting: Physical Therapy

## 2023-05-26 DIAGNOSIS — M6281 Muscle weakness (generalized): Secondary | ICD-10-CM | POA: Diagnosis present

## 2023-05-26 DIAGNOSIS — M25661 Stiffness of right knee, not elsewhere classified: Secondary | ICD-10-CM | POA: Diagnosis present

## 2023-05-26 DIAGNOSIS — R2689 Other abnormalities of gait and mobility: Secondary | ICD-10-CM | POA: Diagnosis present

## 2023-05-26 DIAGNOSIS — R2681 Unsteadiness on feet: Secondary | ICD-10-CM | POA: Insufficient documentation
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
## 2023-05-27 ENCOUNTER — Other Ambulatory Visit: Payer: Self-pay

## 2023-05-27 ENCOUNTER — Encounter: Payer: Self-pay | Admitting: Family Medicine

## 2023-05-27 ENCOUNTER — Other Ambulatory Visit (HOSPITAL_COMMUNITY): Payer: Self-pay

## 2023-05-27 ENCOUNTER — Other Ambulatory Visit: Payer: Self-pay | Admitting: Family Medicine

## 2023-05-27 MED ORDER — ONETOUCH ULTRA VI STRP
ORAL_STRIP | 12 refills | Status: DC
  Filled 2023-05-27: qty 300, 100d supply, fill #0
  Filled 2024-04-06: qty 300, 100d supply, fill #1

## 2023-05-27 MED ORDER — MEMANTINE HCL 10 MG PO TABS
20.0000 mg | ORAL_TABLET | Freq: Every day | ORAL | 0 refills | Status: DC
  Filled 2023-05-27: qty 180, 90d supply, fill #0

## 2023-05-27 MED ORDER — GABAPENTIN 100 MG PO CAPS
200.0000 mg | ORAL_CAPSULE | Freq: Every day | ORAL | 0 refills | Status: DC
  Filled 2023-05-27: qty 180, 90d supply, fill #0

## 2023-05-28 DIAGNOSIS — N3000 Acute cystitis without hematuria: Secondary | ICD-10-CM | POA: Diagnosis not present

## 2023-05-28 DIAGNOSIS — N401 Enlarged prostate with lower urinary tract symptoms: Secondary | ICD-10-CM | POA: Diagnosis not present

## 2023-05-28 DIAGNOSIS — N3941 Urge incontinence: Secondary | ICD-10-CM | POA: Diagnosis not present

## 2023-05-28 DIAGNOSIS — N200 Calculus of kidney: Secondary | ICD-10-CM | POA: Diagnosis not present

## 2023-05-29 ENCOUNTER — Encounter: Payer: Self-pay | Admitting: Physical Therapy

## 2023-05-29 ENCOUNTER — Ambulatory Visit: Payer: PPO | Admitting: Physical Therapy

## 2023-05-29 DIAGNOSIS — R2689 Other abnormalities of gait and mobility: Secondary | ICD-10-CM

## 2023-05-29 DIAGNOSIS — M25661 Stiffness of right knee, not elsewhere classified: Secondary | ICD-10-CM

## 2023-05-29 DIAGNOSIS — R2681 Unsteadiness on feet: Secondary | ICD-10-CM

## 2023-05-29 DIAGNOSIS — M6281 Muscle weakness (generalized): Secondary | ICD-10-CM
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
## 2023-06-02 ENCOUNTER — Ambulatory Visit: Payer: PPO | Admitting: Physical Therapy

## 2023-06-02 ENCOUNTER — Encounter: Payer: Self-pay | Admitting: Physical Therapy

## 2023-06-02 DIAGNOSIS — R2689 Other abnormalities of gait and mobility: Secondary | ICD-10-CM

## 2023-06-02 DIAGNOSIS — M6281 Muscle weakness (generalized): Secondary | ICD-10-CM | POA: Diagnosis not present

## 2023-06-02 DIAGNOSIS — R2681 Unsteadiness on feet: Secondary | ICD-10-CM

## 2023-06-02 DIAGNOSIS — M25661 Stiffness of right knee, not elsewhere classified: Secondary | ICD-10-CM
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
## 2023-06-05 ENCOUNTER — Ambulatory Visit: Payer: PPO

## 2023-06-05 DIAGNOSIS — M6281 Muscle weakness (generalized): Secondary | ICD-10-CM

## 2023-06-05 DIAGNOSIS — M25661 Stiffness of right knee, not elsewhere classified: Secondary | ICD-10-CM

## 2023-06-05 DIAGNOSIS — R2681 Unsteadiness on feet: Secondary | ICD-10-CM

## 2023-06-05 DIAGNOSIS — R2689 Other abnormalities of gait and mobility: Secondary | ICD-10-CM
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
## 2023-06-09 ENCOUNTER — Ambulatory Visit: Payer: PPO

## 2023-06-09 ENCOUNTER — Other Ambulatory Visit (HOSPITAL_COMMUNITY): Payer: Self-pay

## 2023-06-09 DIAGNOSIS — M25661 Stiffness of right knee, not elsewhere classified: Secondary | ICD-10-CM

## 2023-06-09 DIAGNOSIS — R2681 Unsteadiness on feet: Secondary | ICD-10-CM

## 2023-06-09 DIAGNOSIS — M6281 Muscle weakness (generalized): Secondary | ICD-10-CM | POA: Diagnosis not present

## 2023-06-09 DIAGNOSIS — R2689 Other abnormalities of gait and mobility: Secondary | ICD-10-CM
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
## 2023-06-12 ENCOUNTER — Encounter: Payer: Self-pay | Admitting: Physical Therapy

## 2023-06-12 ENCOUNTER — Ambulatory Visit: Payer: PPO | Admitting: Physical Therapy

## 2023-06-12 DIAGNOSIS — R2681 Unsteadiness on feet: Secondary | ICD-10-CM

## 2023-06-12 DIAGNOSIS — M25661 Stiffness of right knee, not elsewhere classified: Secondary | ICD-10-CM

## 2023-06-12 DIAGNOSIS — R2689 Other abnormalities of gait and mobility: Secondary | ICD-10-CM

## 2023-06-12 DIAGNOSIS — M6281 Muscle weakness (generalized): Secondary | ICD-10-CM
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
## 2023-06-13 ENCOUNTER — Other Ambulatory Visit (HOSPITAL_COMMUNITY): Payer: Self-pay

## 2023-06-17 ENCOUNTER — Encounter: Payer: Self-pay | Admitting: Family Medicine

## 2023-06-17 ENCOUNTER — Other Ambulatory Visit (HOSPITAL_COMMUNITY): Payer: Self-pay

## 2023-06-17 ENCOUNTER — Other Ambulatory Visit: Payer: Self-pay

## 2023-06-17 MED ORDER — BUDESONIDE-FORMOTEROL FUMARATE 80-4.5 MCG/ACT IN AERO
2.0000 | INHALATION_SPRAY | Freq: Every day | RESPIRATORY_TRACT | 5 refills | Status: DC
  Filled 2023-06-17 – 2023-06-25 (×2): qty 10.2, 30d supply, fill #0
  Filled 2023-07-01: qty 10.3, 30d supply, fill #0
  Filled 2023-07-10: qty 10.2, 30d supply, fill #0

## 2023-06-18 ENCOUNTER — Ambulatory Visit: Payer: PPO

## 2023-06-18 DIAGNOSIS — M6281 Muscle weakness (generalized): Secondary | ICD-10-CM

## 2023-06-18 DIAGNOSIS — M25661 Stiffness of right knee, not elsewhere classified: Secondary | ICD-10-CM

## 2023-06-18 DIAGNOSIS — R2681 Unsteadiness on feet: Secondary | ICD-10-CM

## 2023-06-18 DIAGNOSIS — R2689 Other abnormalities of gait and mobility: Secondary | ICD-10-CM
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
## 2023-06-20 ENCOUNTER — Telehealth: Payer: Self-pay | Admitting: Family Medicine

## 2023-06-20 ENCOUNTER — Telehealth: Payer: Self-pay

## 2023-06-20 NOTE — Telephone Encounter (Signed)
See phone note by Luster Landsberg - patient has been notified that Ozempic was delivered to our office.

## 2023-06-20 NOTE — Telephone Encounter (Signed)
[employer] called to advise the following:  Pt's Ozempic was delivered today (6.28.24) @ 9:19a and was signed for by [employer] #: [phone]

## 2023-06-20 NOTE — Telephone Encounter (Signed)
Pt called and made aware that Ozempic has arrived. Pt states he will pick it up at his appointment next week. Med placed in back frig.

## 2023-06-25 ENCOUNTER — Other Ambulatory Visit (HOSPITAL_COMMUNITY): Payer: Self-pay

## 2023-06-25 ENCOUNTER — Other Ambulatory Visit: Payer: Self-pay | Admitting: Family Medicine

## 2023-06-27 ENCOUNTER — Ambulatory Visit (INDEPENDENT_AMBULATORY_CARE_PROVIDER_SITE_OTHER): Payer: PPO | Admitting: Family Medicine

## 2023-06-27 ENCOUNTER — Encounter: Payer: Self-pay | Admitting: Family Medicine

## 2023-06-27 VITALS — BP 118/80 | HR 81 | Temp 98.4°F | Resp 18 | Ht 69.0 in | Wt 196.2 lb

## 2023-06-27 DIAGNOSIS — E1165 Type 2 diabetes mellitus with hyperglycemia: Secondary | ICD-10-CM | POA: Diagnosis not present

## 2023-06-27 DIAGNOSIS — Z0000 Encounter for general adult medical examination without abnormal findings: Secondary | ICD-10-CM

## 2023-06-27 DIAGNOSIS — R3911 Hesitancy of micturition: Secondary | ICD-10-CM | POA: Insufficient documentation

## 2023-06-27 DIAGNOSIS — I5032 Chronic diastolic (congestive) heart failure: Secondary | ICD-10-CM

## 2023-06-27 DIAGNOSIS — E1169 Type 2 diabetes mellitus with other specified complication: Secondary | ICD-10-CM | POA: Diagnosis not present

## 2023-06-27 DIAGNOSIS — M4316 Spondylolisthesis, lumbar region: Secondary | ICD-10-CM

## 2023-06-27 DIAGNOSIS — I129 Hypertensive chronic kidney disease with stage 1 through stage 4 chronic kidney disease, or unspecified chronic kidney disease: Secondary | ICD-10-CM | POA: Diagnosis not present

## 2023-06-27 DIAGNOSIS — N182 Chronic kidney disease, stage 2 (mild): Secondary | ICD-10-CM

## 2023-06-27 DIAGNOSIS — I2510 Atherosclerotic heart disease of native coronary artery without angina pectoris: Secondary | ICD-10-CM

## 2023-06-27 DIAGNOSIS — I10 Essential (primary) hypertension: Secondary | ICD-10-CM

## 2023-06-27 DIAGNOSIS — I2699 Other pulmonary embolism without acute cor pulmonale: Secondary | ICD-10-CM | POA: Diagnosis not present

## 2023-06-27 DIAGNOSIS — E1122 Type 2 diabetes mellitus with diabetic chronic kidney disease: Secondary | ICD-10-CM | POA: Diagnosis not present

## 2023-06-27 DIAGNOSIS — Z7984 Long term (current) use of oral hypoglycemic drugs: Secondary | ICD-10-CM

## 2023-06-27 DIAGNOSIS — E785 Hyperlipidemia, unspecified: Secondary | ICD-10-CM

## 2023-06-27 MED ORDER — METFORMIN HCL ER 500 MG PO TB24
500.0000 mg | ORAL_TABLET | Freq: Every day | ORAL | 1 refills | Status: DC
Start: 2023-06-27 — End: 2023-08-05

## 2023-06-27 NOTE — Assessment & Plan Note (Signed)
hgba1c to be checked minimize simple carbs. Increase exercise as tolerated. Continue current meds 

## 2023-06-27 NOTE — Assessment & Plan Note (Signed)
Well controlled, no changes to meds. Encouraged heart healthy diet such as the DASH diet and exercise as tolerated.  °

## 2023-06-27 NOTE — Assessment & Plan Note (Signed)
Ghm utd Check labs  See [facility] Health Maintenance  Topic Date Due   HEMOGLOBIN A1C  06/11/2023   Medicare Annual Wellness (AWV)  07/05/2023   INFLUENZA VACCINE  07/24/2023   Diabetic kidney evaluation - eGFR measurement  12/11/2023   Diabetic kidney evaluation - Urine ACR  12/11/2023   FOOT EXAM  12/11/2023   OPHTHALMOLOGY EXAM  12/19/2023   Colonoscopy  08/14/2028   DTaP/Tdap/Td (4 - Td or Tdap) 07/21/2029   Pneumonia Vaccine 43+ Years old  Completed   COVID-19 Vaccine  Completed   Hepatitis C Screening  Completed   Zoster Vaccines- Shingrix  Completed   HPV VACCINES  Aged Out

## 2023-06-27 NOTE — Assessment & Plan Note (Signed)
hgba1c to be checked, minimize simple carbs. Increase exercise as tolerated. Continue current meds  

## 2023-06-27 NOTE — Assessment & Plan Note (Signed)
Con't PT  Pt doing well

## 2023-06-27 NOTE — Assessment & Plan Note (Signed)
On xaralto 
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
## 2023-06-30 ENCOUNTER — Ambulatory Visit (INDEPENDENT_AMBULATORY_CARE_PROVIDER_SITE_OTHER): Payer: PPO | Admitting: Podiatry

## 2023-06-30 ENCOUNTER — Encounter: Payer: Self-pay | Admitting: Podiatry

## 2023-06-30 DIAGNOSIS — M79674 Pain in right toe(s): Secondary | ICD-10-CM | POA: Diagnosis not present

## 2023-06-30 DIAGNOSIS — M79675 Pain in left toe(s): Secondary | ICD-10-CM

## 2023-06-30 DIAGNOSIS — B351 Tinea unguium: Secondary | ICD-10-CM | POA: Diagnosis not present

## 2023-06-30 DIAGNOSIS — E1121 Type 2 diabetes mellitus with diabetic nephropathy: Secondary | ICD-10-CM | POA: Diagnosis not present

## 2023-06-30 NOTE — Progress Notes (Addendum)
This patient returns to my office for at risk foot care.  This patient requires this care by a professional since this patient will be at risk due to having diabetes and coagulation defect. This patient is unable to cut nails himself since the patient cannot reach his nails.These nails are painful walking and wearing shoes.  This patient presents for at risk foot care today.  General Appearance  Alert, conversant and in no acute stress.  Vascular  Dorsalis pedis and posterior tibial  pulses are palpable  bilaterally.  Capillary return is within normal limits  bilaterally. Cold feet. bilaterally.  Neurologic  Senn-Weinstein monofilament wire test diminished/absent bilaterally. Muscle power within normal limits bilaterally.  Nails Thick disfigured discolored nails with subungual debris  from hallux to fifth toes bilaterally. No evidence of bacterial infection or drainage bilaterally.  Orthopedic  No limitations of motion  feet .  No crepitus or effusions noted.  No bony pathology or digital deformities noted.  Midfoot arthritis  B/L.  Skin  normotropic skin with no porokeratosis noted bilaterally.  No signs of infections or ulcers noted.     Onychomycosis  Pain in right toes  Pain in left toes  Consent was obtained for treatment procedures.   Mechanical debridement of nails 1-5  bilaterally performed with a nail nipper.  Filed with dremel without incident.    Return office visit    4  months                 Told patient to return for periodic foot care and evaluation due to potential at risk complications.   Helane Gunther DPM

## 2023-07-01 ENCOUNTER — Ambulatory Visit: Payer: PPO | Attending: Family Medicine

## 2023-07-01 ENCOUNTER — Other Ambulatory Visit: Payer: Self-pay

## 2023-07-01 ENCOUNTER — Other Ambulatory Visit (HOSPITAL_COMMUNITY): Payer: Self-pay

## 2023-07-01 DIAGNOSIS — R2689 Other abnormalities of gait and mobility: Secondary | ICD-10-CM | POA: Diagnosis present

## 2023-07-01 DIAGNOSIS — M6281 Muscle weakness (generalized): Secondary | ICD-10-CM | POA: Insufficient documentation

## 2023-07-01 DIAGNOSIS — R2681 Unsteadiness on feet: Secondary | ICD-10-CM | POA: Insufficient documentation

## 2023-07-01 DIAGNOSIS — M5459 Other low back pain: Secondary | ICD-10-CM | POA: Insufficient documentation

## 2023-07-01 DIAGNOSIS — M542 Cervicalgia: Secondary | ICD-10-CM | POA: Diagnosis present

## 2023-07-01 DIAGNOSIS — M25661 Stiffness of right knee, not elsewhere classified: Secondary | ICD-10-CM | POA: Diagnosis present
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
## 2023-07-02 ENCOUNTER — Other Ambulatory Visit (INDEPENDENT_AMBULATORY_CARE_PROVIDER_SITE_OTHER): Payer: PPO

## 2023-07-02 DIAGNOSIS — E1165 Type 2 diabetes mellitus with hyperglycemia: Secondary | ICD-10-CM

## 2023-07-02 DIAGNOSIS — E1169 Type 2 diabetes mellitus with other specified complication: Secondary | ICD-10-CM

## 2023-07-02 DIAGNOSIS — E785 Hyperlipidemia, unspecified: Secondary | ICD-10-CM | POA: Diagnosis not present

## 2023-07-02 DIAGNOSIS — R3911 Hesitancy of micturition: Secondary | ICD-10-CM | POA: Diagnosis not present

## 2023-07-02 DIAGNOSIS — I10 Essential (primary) hypertension: Secondary | ICD-10-CM | POA: Diagnosis not present

## 2023-07-02 LAB — COMPREHENSIVE METABOLIC PANEL
ALT: 29 U/L (ref 0–53)
AST: 20 U/L (ref 0–37)
Albumin: 3.7 g/dL (ref 3.5–5.2)
Alkaline Phosphatase: 56 U/L (ref 39–117)
BUN: 21 mg/dL (ref 6–23)
CO2: 23 mEq/L (ref 19–32)
Calcium: 9.3 mg/dL (ref 8.4–10.5)
Chloride: 105 mEq/L (ref 96–112)
Creatinine, Ser: 1.4 mg/dL (ref 0.40–1.50)
GFR: 49.96 mL/min — ABNORMAL LOW (ref >60.00)
Glucose, Bld: 170 mg/dL — ABNORMAL HIGH (ref 70–99)
Potassium: 3.5 mEq/L (ref 3.5–5.1)
Sodium: 139 mEq/L (ref 135–145)
Total Bilirubin: 0.6 mg/dL (ref 0.2–1.2)
Total Protein: 6.6 g/dL (ref 6.0–8.3)

## 2023-07-02 LAB — LIPID PANEL
Cholesterol: 111 mg/dL (ref 0–200)
HDL: 29 mg/dL — ABNORMAL LOW (ref >39.00)
LDL Cholesterol: 48 mg/dL (ref 0–99)
NonHDL: 81.98
Total CHOL/HDL Ratio: 4
Triglycerides: 172 mg/dL — ABNORMAL HIGH (ref 0.0–149.0)
VLDL: 34.4 mg/dL (ref 0.0–40.0)

## 2023-07-02 LAB — PSA: PSA: 1.07 ng/mL (ref 0.10–4.00)

## 2023-07-02 LAB — CBC WITH DIFFERENTIAL/PLATELET
Basophils Absolute: 0.1 10*3/uL (ref 0.0–0.1)
Basophils Relative: 0.4 % (ref 0.0–3.0)
Eosinophils Absolute: 0 10*3/uL (ref 0.0–0.7)
Eosinophils Relative: 0.2 % (ref 0.0–5.0)
HCT: 34.1 % — ABNORMAL LOW (ref 39.0–52.0)
Hemoglobin: 11 g/dL — ABNORMAL LOW (ref 13.0–17.0)
Lymphocytes Relative: 8.9 % — ABNORMAL LOW (ref 12.0–46.0)
Lymphs Abs: 1.5 10*3/uL (ref 0.7–4.0)
MCHC: 32.3 g/dL (ref 30.0–36.0)
MCV: 87.5 fl (ref 78.0–100.0)
Monocytes Absolute: 1.4 10*3/uL — ABNORMAL HIGH (ref 0.1–1.0)
Monocytes Relative: 8.8 % (ref 3.0–12.0)
Neutro Abs: 13.4 10*3/uL — ABNORMAL HIGH (ref 1.4–7.7)
Neutrophils Relative %: 81.7 % — ABNORMAL HIGH (ref 43.0–77.0)
Platelets: 359 10*3/uL (ref 150.0–400.0)
RBC: 3.9 Mil/uL — ABNORMAL LOW (ref 4.22–5.81)
RDW: 17.1 % — ABNORMAL HIGH (ref 11.5–15.5)
WBC: 16.4 10*3/uL — ABNORMAL HIGH (ref 4.0–10.5)

## 2023-07-02 LAB — TSH: TSH: 1.27 u[IU]/mL (ref 0.35–5.50)

## 2023-07-03 ENCOUNTER — Ambulatory Visit: Payer: PPO | Admitting: Physical Therapy

## 2023-07-05 ENCOUNTER — Other Ambulatory Visit (HOSPITAL_COMMUNITY): Payer: Self-pay

## 2023-07-07 ENCOUNTER — Other Ambulatory Visit: Payer: Self-pay | Admitting: Family Medicine

## 2023-07-07 ENCOUNTER — Other Ambulatory Visit (HOSPITAL_COMMUNITY): Payer: Self-pay

## 2023-07-07 ENCOUNTER — Ambulatory Visit: Payer: PPO | Admitting: Physical Therapy

## 2023-07-07 ENCOUNTER — Other Ambulatory Visit: Payer: Self-pay

## 2023-07-07 DIAGNOSIS — R2689 Other abnormalities of gait and mobility: Secondary | ICD-10-CM

## 2023-07-07 DIAGNOSIS — M542 Cervicalgia: Secondary | ICD-10-CM

## 2023-07-07 DIAGNOSIS — M6281 Muscle weakness (generalized): Secondary | ICD-10-CM | POA: Diagnosis not present

## 2023-07-07 DIAGNOSIS — M5459 Other low back pain: Secondary | ICD-10-CM

## 2023-07-07 DIAGNOSIS — R2681 Unsteadiness on feet: Secondary | ICD-10-CM

## 2023-07-07 DIAGNOSIS — M25661 Stiffness of right knee, not elsewhere classified: Secondary | ICD-10-CM

## 2023-07-07 DIAGNOSIS — J4520 Mild intermittent asthma, uncomplicated: Secondary | ICD-10-CM

## 2023-07-07 MED ORDER — TRELEGY ELLIPTA 100-62.5-25 MCG/ACT IN AEPB
1.0000 | INHALATION_SPRAY | Freq: Every day | RESPIRATORY_TRACT | 11 refills | Status: DC
Start: 2023-07-07 — End: 2023-08-05
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
## 2023-07-07 NOTE — Telephone Encounter (Signed)
Called pharmacy to see what would be covered. Pharmacy unable to give me alternative. Please advise

## 2023-07-08 ENCOUNTER — Ambulatory Visit: Payer: PPO | Admitting: *Deleted

## 2023-07-08 VITALS — BP 118/71 | HR 80 | Ht 69.0 in | Wt 197.0 lb

## 2023-07-08 DIAGNOSIS — Z0000 Encounter for general adult medical examination without abnormal findings: Secondary | ICD-10-CM | POA: Diagnosis not present

## 2023-07-08 NOTE — Patient Instructions (Signed)
[name] , Thank you for taking time to come for your Medicare Wellness Visit. I appreciate your ongoing commitment to your health goals. Please review the following plan we discussed and let me know if I can assist you in the future.     This is a list of the screening recommended for you and due dates:  Health Maintenance  Topic Date Due   COVID-19 Vaccine (5 - 2023-24 season) 05/31/2023   Hemoglobin A1C  06/11/2023   Flu Shot  07/24/2023   Yearly kidney health urinalysis for diabetes  12/11/2023   Complete foot exam   12/11/2023   Eye exam for diabetics  12/19/2023   Yearly kidney function blood test for diabetes  07/01/2024   Medicare Annual Wellness Visit  07/07/2024   Colon Cancer Screening  08/14/2028   DTaP/Tdap/Td vaccine (4 - Td or Tdap) 07/21/2029   Pneumonia Vaccine  Completed   Hepatitis C Screening  Completed   Zoster (Shingles) Vaccine  Completed   HPV Vaccine  Aged Out    Next appointment: Follow up in one year for your annual wellness visit.   Preventive Care 77 Years and Older, Male Preventive care refers to lifestyle choices and visits with your health care provider that can promote health and wellness. What does preventive care include? A yearly physical exam. This is also called an annual well check. Dental exams once or twice a year. Routine eye exams. Ask your health care provider how often you should have your eyes checked. Personal lifestyle choices, including: Daily care of your teeth and gums. Regular physical activity. Eating a healthy diet. Avoiding tobacco and drug use. Limiting alcohol use. Practicing safe sex. Taking low doses of aspirin every day. Taking vitamin and mineral supplements as recommended by your health care provider. What happens during an annual well check? The services and screenings done by your health care provider during your annual well check will depend on your age, overall health, lifestyle risk factors, and family  history of disease. Counseling  Your health care provider may ask you questions about your: Alcohol use. Tobacco use. Drug use. Emotional well-being. Home and relationship well-being. Sexual activity. Eating habits. History of falls. Memory and ability to understand (cognition). Work and work Astronomer. Screening  You may have the following tests or measurements: Height, weight, and BMI. Blood pressure. Lipid and cholesterol levels. These may be checked every 5 years, or more frequently if you are over 13 years old. Skin check. Lung cancer screening. You may have this screening every year starting at age 35 if you have a 30-pack-year history of smoking and currently smoke or have quit within the past 15 years. Fecal occult blood test (FOBT) of the stool. You may have this test every year starting at age 2. Flexible sigmoidoscopy or colonoscopy. You may have a sigmoidoscopy every 5 years or a colonoscopy every 10 years starting at age 69. Prostate cancer screening. Recommendations will vary depending on your family history and other risks. Hepatitis C blood test. Hepatitis B blood test. Sexually transmitted disease (STD) testing. Diabetes screening. This is done by checking your blood sugar (glucose) after you have not eaten for a while (fasting). You may have this done every 1-3 years. Abdominal aortic aneurysm (AAA) screening. You may need this if you are a current or former smoker. Osteoporosis. You may be screened starting at age 58 if you are at high risk. Talk with your health care provider about your test results, treatment options, and  if necessary, the need for more tests. Vaccines  Your health care provider may recommend certain vaccines, such as: Influenza vaccine. This is recommended every year. Tetanus, diphtheria, and acellular pertussis (Tdap, Td) vaccine. You may need a Td booster every 10 years. Zoster vaccine. You may need this after age 33. Pneumococcal  13-valent conjugate (PCV13) vaccine. One dose is recommended after age 85. Pneumococcal polysaccharide (PPSV23) vaccine. One dose is recommended after age 60. Talk to your health care provider about which screenings and vaccines you need and how often you need them. This information is not intended to replace advice given to you by your health care provider. Make sure you discuss any questions you have with your health care provider. Document Released: 01/05/2016 Document Revised: 08/28/2016 Document Reviewed: 10/10/2015 Elsevier Interactive Patient Education  2017 [employer].  Fall Prevention in the Home Falls can cause injuries. They can happen to people of all ages. There are many things you can do to make your home safe and to help prevent falls. What can I do on the outside of my home? Regularly fix the edges of walkways and driveways and fix any cracks. Remove anything that might make you trip as you walk through a door, such as a raised step or threshold. Trim any bushes or trees on the path to your home. Use bright outdoor lighting. Clear any walking paths of anything that might make someone trip, such as rocks or tools. Regularly check to see if handrails are loose or broken. Make sure that both sides of any steps have handrails. Any raised decks and porches should have guardrails on the edges. Have any leaves, snow, or ice cleared regularly. Use sand or salt on walking paths during winter. Clean up any spills in your garage right away. This includes oil or grease spills. What can I do in the bathroom? Use night lights. Install grab bars by the toilet and in the tub and shower. Do not use towel bars as grab bars. Use non-skid mats or decals in the tub or shower. If you need to sit down in the shower, use a plastic, non-slip stool. Keep the floor dry. Clean up any water that spills on the floor as soon as it happens. Remove soap buildup in the tub or shower regularly. Attach  bath mats securely with double-sided non-slip rug tape. Do not have throw rugs and other things on the floor that can make you trip. What can I do in the bedroom? Use night lights. Make sure that you have a light by your bed that is easy to reach. Do not use any sheets or blankets that are too big for your bed. They should not hang down onto the floor. Have a firm chair that has side arms. You can use this for support while you get dressed. Do not have throw rugs and other things on the floor that can make you trip. What can I do in the kitchen? Clean up any spills right away. Avoid walking on wet floors. Keep items that you use a lot in easy-to-reach places. If you need to reach something above you, use a strong step stool that has a grab bar. Keep electrical cords out of the way. Do not use floor polish or wax that makes floors slippery. If you must use wax, use non-skid floor wax. Do not have throw rugs and other things on the floor that can make you trip. What can I do with my stairs? Do not leave any  items on the stairs. Make sure that there are handrails on both sides of the stairs and use them. Fix handrails that are broken or loose. Make sure that handrails are as long as the stairways. Check any carpeting to make sure that it is firmly attached to the stairs. Fix any carpet that is loose or worn. Avoid having throw rugs at the top or bottom of the stairs. If you do have throw rugs, attach them to the floor with carpet tape. Make sure that you have a light switch at the top of the stairs and the bottom of the stairs. If you do not have them, ask someone to add them for you. What else can I do to help prevent falls? Wear shoes that: Do not have high heels. Have rubber bottoms. Are comfortable and fit you well. Are closed at the toe. Do not wear sandals. If you use a stepladder: Make sure that it is fully opened. Do not climb a closed stepladder. Make sure that both sides of the  stepladder are locked into place. Ask someone to hold it for you, if possible. Clearly mark and make sure that you can see: Any grab bars or handrails. First and last steps. Where the edge of each step is. Use tools that help you move around (mobility aids) if they are needed. These include: Canes. Walkers. Scooters. Crutches. Turn on the lights when you go into a dark area. Replace any light bulbs as soon as they burn out. Set up your furniture so you have a clear path. Avoid moving your furniture around. If any of your floors are uneven, fix them. If there are any pets around you, be aware of where they are. Review your medicines with your doctor. Some medicines can make you feel dizzy. This can increase your chance of falling. Ask your doctor what other things that you can do to help prevent falls. This information is not intended to replace advice given to you by your health care provider. Make sure you discuss any questions you have with your health care provider. Document Released: 10/05/2009 Document Revised: 05/16/2016 Document Reviewed: 01/13/2015 Elsevier Interactive Patient Education  2017 [employer].
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
## 2023-07-09 ENCOUNTER — Ambulatory Visit: Payer: PPO

## 2023-07-09 ENCOUNTER — Other Ambulatory Visit (HOSPITAL_COMMUNITY): Payer: Self-pay

## 2023-07-09 DIAGNOSIS — M5459 Other low back pain: Secondary | ICD-10-CM

## 2023-07-09 DIAGNOSIS — R2681 Unsteadiness on feet: Secondary | ICD-10-CM

## 2023-07-09 DIAGNOSIS — M25661 Stiffness of right knee, not elsewhere classified: Secondary | ICD-10-CM

## 2023-07-09 DIAGNOSIS — M6281 Muscle weakness (generalized): Secondary | ICD-10-CM

## 2023-07-09 DIAGNOSIS — R2689 Other abnormalities of gait and mobility: Secondary | ICD-10-CM

## 2023-07-09 DIAGNOSIS — M542 Cervicalgia: Secondary | ICD-10-CM
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
## 2023-07-10 ENCOUNTER — Other Ambulatory Visit (HOSPITAL_COMMUNITY): Payer: Self-pay

## 2023-07-10 ENCOUNTER — Other Ambulatory Visit: Payer: Self-pay

## 2023-07-14 ENCOUNTER — Ambulatory Visit: Payer: PPO | Admitting: Physical Therapy

## 2023-07-14 ENCOUNTER — Encounter: Payer: Self-pay | Admitting: Physical Therapy

## 2023-07-14 ENCOUNTER — Other Ambulatory Visit: Payer: Self-pay

## 2023-07-14 ENCOUNTER — Other Ambulatory Visit (HOSPITAL_COMMUNITY): Payer: Self-pay

## 2023-07-14 DIAGNOSIS — R2681 Unsteadiness on feet: Secondary | ICD-10-CM

## 2023-07-14 DIAGNOSIS — M5459 Other low back pain: Secondary | ICD-10-CM

## 2023-07-14 DIAGNOSIS — M542 Cervicalgia: Secondary | ICD-10-CM

## 2023-07-14 DIAGNOSIS — M6281 Muscle weakness (generalized): Secondary | ICD-10-CM

## 2023-07-14 DIAGNOSIS — M25661 Stiffness of right knee, not elsewhere classified: Secondary | ICD-10-CM
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
## 2023-07-17 ENCOUNTER — Ambulatory Visit: Payer: PPO

## 2023-07-17 DIAGNOSIS — M25661 Stiffness of right knee, not elsewhere classified: Secondary | ICD-10-CM

## 2023-07-17 DIAGNOSIS — R2681 Unsteadiness on feet: Secondary | ICD-10-CM

## 2023-07-17 DIAGNOSIS — M6281 Muscle weakness (generalized): Secondary | ICD-10-CM

## 2023-07-17 DIAGNOSIS — R2689 Other abnormalities of gait and mobility: Secondary | ICD-10-CM

## 2023-07-17 DIAGNOSIS — M542 Cervicalgia: Secondary | ICD-10-CM

## 2023-07-17 DIAGNOSIS — M5459 Other low back pain: Secondary | ICD-10-CM
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
## 2023-07-21 ENCOUNTER — Ambulatory Visit: Payer: PPO

## 2023-07-21 DIAGNOSIS — M6281 Muscle weakness (generalized): Secondary | ICD-10-CM | POA: Diagnosis not present

## 2023-07-21 DIAGNOSIS — M542 Cervicalgia: Secondary | ICD-10-CM

## 2023-07-21 DIAGNOSIS — M5459 Other low back pain: Secondary | ICD-10-CM

## 2023-07-21 DIAGNOSIS — R2689 Other abnormalities of gait and mobility: Secondary | ICD-10-CM

## 2023-07-21 DIAGNOSIS — R2681 Unsteadiness on feet: Secondary | ICD-10-CM

## 2023-07-21 DIAGNOSIS — M25661 Stiffness of right knee, not elsewhere classified: Secondary | ICD-10-CM
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
## 2023-07-22 ENCOUNTER — Other Ambulatory Visit: Payer: Self-pay

## 2023-07-23 ENCOUNTER — Encounter: Payer: Self-pay | Admitting: Emergency Medicine

## 2023-07-23 ENCOUNTER — Ambulatory Visit
Admission: EM | Admit: 2023-07-23 | Discharge: 2023-07-23 | Disposition: A | Discharge status: Home / Self Care | Payer: PPO | Attending: Family Medicine | Admitting: Family Medicine

## 2023-07-23 DIAGNOSIS — R509 Fever, unspecified: Secondary | ICD-10-CM | POA: Diagnosis not present

## 2023-07-23 DIAGNOSIS — R059 Cough, unspecified: Secondary | ICD-10-CM | POA: Diagnosis not present

## 2023-07-23 MED ORDER — ACETAMINOPHEN 500 MG PO TABS
1000.0000 mg | ORAL_TABLET | Freq: Once | ORAL | Status: AC
  Administered 2023-07-23: 1000 mg via ORAL

## 2023-07-23 MED ORDER — BENZONATATE 200 MG PO CAPS: 200.0000 mg | ORAL_CAPSULE | Freq: Three times a day (TID) | ORAL | 0 refills | Status: DC | PRN

## 2023-07-23 NOTE — ED Triage Notes (Signed)
Patient's wife c/o cough, congestion, SOB and fever x 1 day.  Denies any OTC meds.
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
## 2023-07-23 NOTE — Discharge Instructions (Addendum)
Advised patient/wife COVID-19 was negative.  Advised may take Tessalon Perles daily or as needed for cough.  Advised patient/wife may take OTC Tylenol 1000 mg every 6 hours for fever (oral temperature greater than 100.3) Advised patient if symptoms worsen and/or unresolved please follow-up with PCP or here for further evaluation.

## 2023-07-24 ENCOUNTER — Encounter: Payer: PPO | Admitting: Physical Therapy

## 2023-07-28 ENCOUNTER — Ambulatory Visit (INDEPENDENT_AMBULATORY_CARE_PROVIDER_SITE_OTHER): Payer: PPO

## 2023-07-28 ENCOUNTER — Inpatient Hospital Stay (HOSPITAL_COMMUNITY)
Admission: EM | Admit: 2023-07-28 | Discharge: 2023-08-01 | DRG: 660 | Disposition: A | Discharge status: Home / Self Care | Payer: PPO | Attending: Family Medicine | Admitting: Family Medicine

## 2023-07-28 ENCOUNTER — Telehealth: Payer: Self-pay | Admitting: Family Medicine

## 2023-07-28 ENCOUNTER — Other Ambulatory Visit: Payer: Self-pay

## 2023-07-28 ENCOUNTER — Ambulatory Visit: Payer: PPO

## 2023-07-28 ENCOUNTER — Encounter (HOSPITAL_COMMUNITY): Payer: Self-pay

## 2023-07-28 ENCOUNTER — Ambulatory Visit
Admission: EM | Admit: 2023-07-28 | Discharge: 2023-07-28 | Disposition: A | Discharge status: Home / Self Care | Payer: PPO | Attending: Family Medicine | Admitting: Family Medicine

## 2023-07-28 DIAGNOSIS — I110 Hypertensive heart disease with heart failure: Secondary | ICD-10-CM | POA: Diagnosis present

## 2023-07-28 DIAGNOSIS — N179 Acute kidney failure, unspecified: Principal | ICD-10-CM | POA: Diagnosis present

## 2023-07-28 DIAGNOSIS — R3911 Hesitancy of micturition: Secondary | ICD-10-CM | POA: Diagnosis present

## 2023-07-28 DIAGNOSIS — E872 Acidosis, unspecified: Secondary | ICD-10-CM | POA: Diagnosis present

## 2023-07-28 DIAGNOSIS — I5032 Chronic diastolic (congestive) heart failure: Secondary | ICD-10-CM | POA: Diagnosis not present

## 2023-07-28 DIAGNOSIS — E785 Hyperlipidemia, unspecified: Secondary | ICD-10-CM | POA: Diagnosis present

## 2023-07-28 DIAGNOSIS — R209 Unspecified disturbances of skin sensation: Secondary | ICD-10-CM | POA: Diagnosis not present

## 2023-07-28 DIAGNOSIS — R5383 Other fatigue: Secondary | ICD-10-CM

## 2023-07-28 DIAGNOSIS — Z823 Family history of stroke: Secondary | ICD-10-CM

## 2023-07-28 DIAGNOSIS — Z7901 Long term (current) use of anticoagulants: Secondary | ICD-10-CM

## 2023-07-28 DIAGNOSIS — Z8249 Family history of ischemic heart disease and other diseases of the circulatory system: Secondary | ICD-10-CM

## 2023-07-28 DIAGNOSIS — Z85828 Personal history of other malignant neoplasm of skin: Secondary | ICD-10-CM

## 2023-07-28 DIAGNOSIS — B965 Pseudomonas (aeruginosa) (mallei) (pseudomallei) as the cause of diseases classified elsewhere: Secondary | ICD-10-CM | POA: Diagnosis present

## 2023-07-28 DIAGNOSIS — M109 Gout, unspecified: Secondary | ICD-10-CM | POA: Diagnosis present

## 2023-07-28 DIAGNOSIS — I2699 Other pulmonary embolism without acute cor pulmonale: Secondary | ICD-10-CM | POA: Diagnosis present

## 2023-07-28 DIAGNOSIS — Z91018 Allergy to other foods: Secondary | ICD-10-CM

## 2023-07-28 DIAGNOSIS — G4733 Obstructive sleep apnea (adult) (pediatric): Secondary | ICD-10-CM | POA: Diagnosis present

## 2023-07-28 DIAGNOSIS — Z1152 Encounter for screening for COVID-19: Secondary | ICD-10-CM

## 2023-07-28 DIAGNOSIS — Z7984 Long term (current) use of oral hypoglycemic drugs: Secondary | ICD-10-CM

## 2023-07-28 DIAGNOSIS — N3000 Acute cystitis without hematuria: Secondary | ICD-10-CM | POA: Diagnosis not present

## 2023-07-28 DIAGNOSIS — R531 Weakness: Secondary | ICD-10-CM

## 2023-07-28 DIAGNOSIS — R059 Cough, unspecified: Secondary | ICD-10-CM

## 2023-07-28 DIAGNOSIS — E871 Hypo-osmolality and hyponatremia: Secondary | ICD-10-CM | POA: Diagnosis present

## 2023-07-28 DIAGNOSIS — R32 Unspecified urinary incontinence: Secondary | ICD-10-CM | POA: Diagnosis present

## 2023-07-28 DIAGNOSIS — E1169 Type 2 diabetes mellitus with other specified complication: Secondary | ICD-10-CM | POA: Diagnosis present

## 2023-07-28 DIAGNOSIS — Z91030 Bee allergy status: Secondary | ICD-10-CM

## 2023-07-28 DIAGNOSIS — Z79899 Other long term (current) drug therapy: Secondary | ICD-10-CM

## 2023-07-28 DIAGNOSIS — G43009 Migraine without aura, not intractable, without status migrainosus: Secondary | ICD-10-CM | POA: Diagnosis present

## 2023-07-28 DIAGNOSIS — I10 Essential (primary) hypertension: Secondary | ICD-10-CM | POA: Diagnosis present

## 2023-07-28 DIAGNOSIS — E1121 Type 2 diabetes mellitus with diabetic nephropathy: Secondary | ICD-10-CM | POA: Diagnosis present

## 2023-07-28 DIAGNOSIS — Z86711 Personal history of pulmonary embolism: Secondary | ICD-10-CM

## 2023-07-28 DIAGNOSIS — K219 Gastro-esophageal reflux disease without esophagitis: Secondary | ICD-10-CM | POA: Diagnosis present

## 2023-07-28 DIAGNOSIS — D649 Anemia, unspecified: Secondary | ICD-10-CM | POA: Insufficient documentation

## 2023-07-28 DIAGNOSIS — Z7985 Long-term (current) use of injectable non-insulin antidiabetic drugs: Secondary | ICD-10-CM

## 2023-07-28 DIAGNOSIS — Z87891 Personal history of nicotine dependence: Secondary | ICD-10-CM

## 2023-07-28 DIAGNOSIS — I2510 Atherosclerotic heart disease of native coronary artery without angina pectoris: Secondary | ICD-10-CM | POA: Diagnosis present

## 2023-07-28 DIAGNOSIS — N136 Pyonephrosis: Secondary | ICD-10-CM | POA: Diagnosis present

## 2023-07-28 DIAGNOSIS — J029 Acute pharyngitis, unspecified: Secondary | ICD-10-CM | POA: Diagnosis not present

## 2023-07-28 DIAGNOSIS — Z7951 Long term (current) use of inhaled steroids: Secondary | ICD-10-CM

## 2023-07-28 DIAGNOSIS — E860 Dehydration: Secondary | ICD-10-CM | POA: Diagnosis present

## 2023-07-28 DIAGNOSIS — E1151 Type 2 diabetes mellitus with diabetic peripheral angiopathy without gangrene: Secondary | ICD-10-CM | POA: Diagnosis present

## 2023-07-28 DIAGNOSIS — N401 Enlarged prostate with lower urinary tract symptoms: Secondary | ICD-10-CM | POA: Diagnosis present

## 2023-07-28 DIAGNOSIS — Z833 Family history of diabetes mellitus: Secondary | ICD-10-CM

## 2023-07-28 DIAGNOSIS — N201 Calculus of ureter: Secondary | ICD-10-CM | POA: Diagnosis present

## 2023-07-28 LAB — CBC WITH DIFFERENTIAL/PLATELET
Abs Immature Granulocytes: 0.43 10*3/uL — ABNORMAL HIGH (ref 0.00–0.07)
Basophils Absolute: 0 10*3/uL (ref 0.0–0.2)
Basophils Absolute: 0 10*3/uL (ref 0.0–0.1)
Basophils Relative: 0 %
Basos: 0 %
EOS (ABSOLUTE): 0.2 10*3/uL (ref 0.0–0.4)
Eos: 1 %
Eosinophils Absolute: 0.1 10*3/uL (ref 0.0–0.5)
Eosinophils Relative: 1 %
HCT: 32.9 % — ABNORMAL LOW (ref 39.0–52.0)
Hematocrit: 32 % — ABNORMAL LOW (ref 37.5–51.0)
Hemoglobin: 10.3 g/dL — ABNORMAL LOW (ref 13.0–17.7)
Hemoglobin: 10.3 g/dL — ABNORMAL LOW (ref 13.0–17.0)
Immature Granulocytes: 2 %
Immature Granulocytes: 4 %
Lymphocytes Absolute: 1.3 10*3/uL (ref 0.7–3.1)
Lymphocytes Relative: 7 %
Lymphs Abs: 0.8 10*3/uL (ref 0.7–4.0)
Lymphs: 9 %
MCH: 28.1 pg (ref 26.6–33.0)
MCH: 27.2 pg (ref 26.0–34.0)
MCHC: 32.2 g/dL (ref 31.5–35.7)
MCHC: 31.3 g/dL (ref 30.0–36.0)
MCV: 87 fL (ref 79–97)
MCV: 87 fL (ref 80.0–100.0)
Monocytes Absolute: 1.3 10*3/uL — ABNORMAL HIGH (ref 0.1–0.9)
Monocytes Absolute: 0.5 10*3/uL (ref 0.1–1.0)
Monocytes Relative: 4 %
Monocytes: 9 %
Neutro Abs: 9.9 10*3/uL — ABNORMAL HIGH (ref 1.7–7.7)
Neutrophils Absolute: 11.3 10*3/uL — ABNORMAL HIGH (ref 1.4–7.0)
Neutrophils Relative %: 84 %
Neutrophils: 79 %
Platelets: 337 10*3/uL (ref 150–450)
Platelets: 322 10*3/uL (ref 150–400)
RBC: 3.67 x10E6/uL — ABNORMAL LOW (ref 4.14–5.80)
RBC: 3.78 MIL/uL — ABNORMAL LOW (ref 4.22–5.81)
RDW: 15.9 % — ABNORMAL HIGH (ref 11.6–15.4)
RDW: 16.2 % — ABNORMAL HIGH (ref 11.5–15.5)
WBC: 14.4 10*3/uL — ABNORMAL HIGH (ref 3.4–10.8)
WBC: 11.7 10*3/uL — ABNORMAL HIGH (ref 4.0–10.5)
nRBC: 0 % (ref 0.0–0.2)

## 2023-07-28 LAB — COMPREHENSIVE METABOLIC PANEL
ALT: 70 IU/L — ABNORMAL HIGH (ref 0–44)
ALT: 68 U/L — ABNORMAL HIGH (ref 0–44)
AST: 64 IU/L — ABNORMAL HIGH (ref 0–40)
AST: 54 U/L — ABNORMAL HIGH (ref 15–41)
Albumin: 2.9 g/dL — ABNORMAL LOW (ref 3.8–4.8)
Albumin: 2.2 g/dL — ABNORMAL LOW (ref 3.5–5.0)
Alkaline Phosphatase: 170 IU/L — ABNORMAL HIGH (ref 44–121)
Alkaline Phosphatase: 127 U/L — ABNORMAL HIGH (ref 38–126)
Anion gap: 12 (ref 5–15)
BUN/Creatinine Ratio: 19 (ref 10–24)
BUN: 68 mg/dL — ABNORMAL HIGH (ref 8–27)
BUN: 66 mg/dL — ABNORMAL HIGH (ref 8–23)
Bilirubin Total: 0.7 mg/dL (ref 0.0–1.2)
CO2: 18 mmol/L — ABNORMAL LOW (ref 20–29)
CO2: 18 mmol/L — ABNORMAL LOW (ref 22–32)
Calcium: 8.9 mg/dL (ref 8.6–10.2)
Calcium: 9.1 mg/dL (ref 8.9–10.3)
Chloride: 102 mmol/L (ref 96–106)
Chloride: 100 mmol/L (ref 98–111)
Creatinine, Ser: 3.56 mg/dL — ABNORMAL HIGH (ref 0.76–1.27)
Creatinine, Ser: 3.65 mg/dL — ABNORMAL HIGH (ref 0.61–1.24)
GFR, Estimated: 17 mL/min — ABNORMAL LOW (ref >60)
Globulin, Total: 3.5 g/dL (ref 1.5–4.5)
Glucose, Bld: 164 mg/dL — ABNORMAL HIGH (ref 70–99)
Glucose: 175 mg/dL — ABNORMAL HIGH (ref 70–99)
Potassium: 5 mmol/L (ref 3.5–5.2)
Potassium: 4.7 mmol/L (ref 3.5–5.1)
Sodium: 134 mmol/L (ref 134–144)
Sodium: 130 mmol/L — ABNORMAL LOW (ref 135–145)
Total Bilirubin: 0.9 mg/dL (ref 0.3–1.2)
Total Protein: 6.4 g/dL (ref 6.0–8.5)
Total Protein: 7.1 g/dL (ref 6.5–8.1)
eGFR: 17 mL/min/{1.73_m2} — ABNORMAL LOW (ref >59)

## 2023-07-28 LAB — URINALYSIS, ROUTINE W REFLEX MICROSCOPIC
Bilirubin Urine: NEGATIVE
Glucose, UA: NEGATIVE mg/dL
Ketones, ur: NEGATIVE mg/dL
Nitrite: NEGATIVE
Protein, ur: NEGATIVE mg/dL
Specific Gravity, Urine: 1.006 (ref 1.005–1.030)
WBC, UA: >50 WBC/hpf (ref 0–5)
pH: 5 (ref 5.0–8.0)

## 2023-07-28 LAB — SARS CORONAVIRUS 2 BY RT PCR: SARS Coronavirus 2 by RT PCR: NEGATIVE

## 2023-07-28 LAB — HEMOGLOBIN A1C
Hgb A1c MFr Bld: 7 % — ABNORMAL HIGH (ref 4.8–5.6)
Mean Plasma Glucose: 154.2 mg/dL

## 2023-07-28 LAB — GROUP A STREP BY PCR: Group A Strep by PCR: NOT DETECTED

## 2023-07-28 LAB — I-STAT CG4 LACTIC ACID, ED: Lactic Acid, Venous: 1.5 mmol/L (ref 0.5–1.9)

## 2023-07-28 LAB — GLUCOSE, CAPILLARY: Glucose-Capillary: 166 mg/dL — ABNORMAL HIGH (ref 70–99)

## 2023-07-28 MED ORDER — INSULIN ASPART 100 UNIT/ML IJ SOLN: 0.0000 [IU] | Freq: Every day | INTRAMUSCULAR | Status: DC

## 2023-07-28 MED ORDER — MEMANTINE HCL 10 MG PO TABS
20.0000 mg | ORAL_TABLET | Freq: Every day | ORAL | Status: DC
  Administered 2023-07-29 – 2023-08-01 (×3): 20 mg via ORAL
  Filled 2023-07-28 (×4): qty 2

## 2023-07-28 MED ORDER — SODIUM CHLORIDE 0.9 % IV SOLN
1.0000 g | INTRAVENOUS | Status: DC
  Administered 2023-07-29: 1 g via INTRAVENOUS
  Filled 2023-07-28: qty 10

## 2023-07-28 MED ORDER — FENOFIBRATE 160 MG PO TABS
160.0000 mg | ORAL_TABLET | Freq: Every day | ORAL | Status: DC
  Administered 2023-07-29 – 2023-07-31 (×3): 160 mg via ORAL
  Filled 2023-07-28 (×3): qty 1

## 2023-07-28 MED ORDER — GABAPENTIN 100 MG PO CAPS
200.0000 mg | ORAL_CAPSULE | Freq: Every day | ORAL | Status: DC
  Administered 2023-07-29 – 2023-07-31 (×4): 200 mg via ORAL
  Filled 2023-07-28 (×4): qty 2

## 2023-07-28 MED ORDER — SODIUM CHLORIDE 0.9 % IV SOLN
1.0000 g | Freq: Once | INTRAVENOUS | Status: AC
  Administered 2023-07-28: 1 g via INTRAVENOUS
  Filled 2023-07-28: qty 10

## 2023-07-28 MED ORDER — ONDANSETRON HCL 4 MG PO TABS: 4.0000 mg | ORAL_TABLET | Freq: Four times a day (QID) | ORAL | Status: DC | PRN

## 2023-07-28 MED ORDER — INSULIN ASPART 100 UNIT/ML IJ SOLN
0.0000 [IU] | Freq: Three times a day (TID) | INTRAMUSCULAR | Status: DC
  Administered 2023-07-29 (×2): 5 [IU] via SUBCUTANEOUS

## 2023-07-28 MED ORDER — PREDNISONE 20 MG PO TABS: ORAL_TABLET | ORAL | 0 refills | Status: DC

## 2023-07-28 MED ORDER — SODIUM CHLORIDE 0.9 % IV BOLUS (SEPSIS)
1000.0000 mL | Freq: Once | INTRAVENOUS | Status: AC
  Administered 2023-07-28: 1000 mL via INTRAVENOUS

## 2023-07-28 MED ORDER — TAMSULOSIN HCL 0.4 MG PO CAPS
0.4000 mg | ORAL_CAPSULE | Freq: Every day | ORAL | Status: DC
  Administered 2023-07-29 – 2023-08-01 (×3): 0.4 mg via ORAL
  Filled 2023-07-28 (×3): qty 1

## 2023-07-28 MED ORDER — TOPIRAMATE 25 MG PO TABS
50.0000 mg | ORAL_TABLET | Freq: Two times a day (BID) | ORAL | Status: DC
  Administered 2023-07-29 – 2023-08-01 (×7): 50 mg via ORAL
  Filled 2023-07-28 (×7): qty 2

## 2023-07-28 MED ORDER — ATORVASTATIN CALCIUM 80 MG PO TABS
80.0000 mg | ORAL_TABLET | Freq: Every day | ORAL | Status: DC
  Administered 2023-07-29 – 2023-07-31 (×3): 80 mg via ORAL
  Filled 2023-07-28 (×3): qty 1

## 2023-07-28 MED ORDER — MELATONIN 5 MG PO TABS
10.0000 mg | ORAL_TABLET | Freq: Every evening | ORAL | Status: DC | PRN
  Filled 2023-07-28: qty 2

## 2023-07-28 MED ORDER — AMOXICILLIN 875 MG PO TABS: 875.0000 mg | ORAL_TABLET | Freq: Two times a day (BID) | ORAL | 0 refills | Status: DC

## 2023-07-28 MED ORDER — ONDANSETRON HCL 4 MG/2ML IJ SOLN: 4.0000 mg | Freq: Four times a day (QID) | INTRAMUSCULAR | Status: DC | PRN

## 2023-07-28 MED ORDER — METOPROLOL TARTRATE 50 MG PO TABS
75.0000 mg | ORAL_TABLET | Freq: Two times a day (BID) | ORAL | Status: DC
  Administered 2023-07-29 – 2023-08-01 (×6): 75 mg via ORAL
  Filled 2023-07-28 (×7): qty 2

## 2023-07-28 MED ORDER — RIVAROXABAN 10 MG PO TABS
10.0000 mg | ORAL_TABLET | Freq: Every day | ORAL | Status: DC
  Administered 2023-07-29 – 2023-08-01 (×3): 10 mg via ORAL
  Filled 2023-07-28 (×4): qty 1

## 2023-07-28 MED ORDER — PANTOPRAZOLE SODIUM 40 MG PO TBEC
40.0000 mg | DELAYED_RELEASE_TABLET | Freq: Every day | ORAL | Status: DC
  Administered 2023-07-29 – 2023-08-01 (×3): 40 mg via ORAL
  Filled 2023-07-28 (×3): qty 1

## 2023-07-28 MED ORDER — ACETAMINOPHEN 650 MG RE SUPP: 650.0000 mg | Freq: Four times a day (QID) | RECTAL | Status: DC | PRN

## 2023-07-28 MED ORDER — ACETAMINOPHEN 325 MG PO TABS
650.0000 mg | ORAL_TABLET | Freq: Four times a day (QID) | ORAL | Status: DC | PRN
  Administered 2023-07-30: 650 mg via ORAL
  Filled 2023-07-28: qty 2

## 2023-07-28 MED ORDER — SODIUM CHLORIDE 0.9 % IV SOLN
1000.0000 mL | INTRAVENOUS | Status: DC
  Administered 2023-07-28 – 2023-07-29 (×2): 1000 mL via INTRAVENOUS

## 2023-07-28 NOTE — ED Notes (Signed)
Contacted [facility] ready to receive pt.

## 2023-07-28 NOTE — ED Triage Notes (Addendum)
Pt to ED from UC for further evaluation of AKI (creatinine 3.56). Reports decreased PO intake and urination. Increased weakness.

## 2023-07-28 NOTE — Assessment & Plan Note (Addendum)
Continue lipitor 80 mg daily, fenofibrate 160 mg daily.

## 2023-07-28 NOTE — Assessment & Plan Note (Signed)
Likely due to hypovolemia and possibly Lasix.  Continue with IV normal saline.  Repeat BMP in the morning.

## 2023-07-28 NOTE — Assessment & Plan Note (Signed)
Nonpalpable DT/PT foot. Cap refill 3 seconds. Pt does not c/o of any pain.  But right foot is notably cooler than his warm left foot. Check abi.

## 2023-07-28 NOTE — Assessment & Plan Note (Signed)
Stable. Continue with lopressor 75 mg bid. Hold lasix.
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
## 2023-07-28 NOTE — Subjective & Objective (Signed)
CC: send to ER from UC due to AKI HPI: 72 year old male with viral-like illness since last Wednesday presents the ER from urgent care.  He was seen last Wednesday.  Diagnosed with an upper wrist infection.  Was given Tylenol.  Symptoms continue to have worsened.  He had a fever initially.  He has been having some difficulty with urination.  No burning at all.  Seen back in urgent care today.  Had labs drawn which showed acute kidney injury.  Sent to ER for evaluation.  Patient son Arlys John states the patient's blood sugars been running in the 300s.  Patient uses metformin at home.  Does not use insulin.  Vital signs Temp 97.8, heart rate 71 blood pressure 90/62.  Sat 10% on room air.  White 11.7, hemoglobin 10.3, platelets of 322  Sodium 130, potassium 4.7, bicarb 18, BUN 66, creatinine 3.65, glucose 164  Cath UA shows moderate hemoglobin, large leukocyte esterase, greater than 50 WBCs.  Many bacteria.  COVID was negative.  Chest x-ray shows no acute cardiopulmonary use.  Patient started IV fluids.  Given a 1 gram of Rocephin.  Urine culture sent.  [facility] hospitalist contacted for admission.

## 2023-07-28 NOTE — Assessment & Plan Note (Signed)
Hold metformin. Add SSI. 

## 2023-07-28 NOTE — Assessment & Plan Note (Signed)
Continue topomax for prophylaxis.

## 2023-07-28 NOTE — Assessment & Plan Note (Signed)
Observation med/surg bed. Continue with IVF. Hold lasix and celebrex. Likely due to poor po intake, UTI, meds. Avoid nephrotoxic agents. Repeat BMP in AM.

## 2023-07-28 NOTE — ED Triage Notes (Signed)
C/o sore throat since Saturday. Reports already has had covid and flu tests which were negative.

## 2023-07-28 NOTE — Assessment & Plan Note (Signed)
Continue with xarelto 20 mg daily

## 2023-07-28 NOTE — Telephone Encounter (Signed)
Patient instructed to present to ED per Ragan NP, for acute kidney injury and elevated WBC count. Patient verbalizes understanding.

## 2023-07-28 NOTE — Discharge Instructions (Addendum)
Advised patient/family of chest x-ray results with hardcopy provided.  Advised patient to take medications as directed with food to completion. Encouraged increase daily water intake to 64 ounces per day while taking these medications.  Advised patient and family we will follow-up with lab results once received.  Advised patient if symptoms worsen and/or unresolved please follow-up with PCP or here for further evaluation.
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
## 2023-07-28 NOTE — Assessment & Plan Note (Signed)
Continue with IVF. Pt had some urinary retention. Start po flomax.

## 2023-07-28 NOTE — ED Notes (Signed)
Stat lab pickup called [facility]; confirmation number [id number]

## 2023-07-28 NOTE — Assessment & Plan Note (Signed)
Continue with CPAP

## 2023-07-28 NOTE — Assessment & Plan Note (Signed)
Hold lasix due to AKI. Continue lopressor bid.

## 2023-07-28 NOTE — Assessment & Plan Note (Signed)
Check iron panel. Pt state he follows with urology. Has had hematuria in the past.

## 2023-07-28 NOTE — ED Provider Notes (Signed)
[place] EMERGENCY DEPARTMENT AT [facility] Provider Note   CSN: [id number] Arrival date & time: 07/28/23  1627     History  Chief Complaint  Patient presents with   Abnormal Lab    Garrett [name] is a 72 y.o. male.   Abnormal Lab    Patient has a history of asthma arthritis gout hypertension reflux cellulitis CHF pulmonary embolism.  Per the medical records patient went to an urgent care today complaining of sore throat for a few days.  He also had generalized malaise and weakness.  Patient noted to have oropharyngeal erythema and tonsillar edema.  Patient was given an empiric course of antibiotics and steroids for his acute pharyngitis.  Patient also had laboratory tests.  His outpatient labs showed a creatinine of 3.56.  3 weeks ago his creatinine was 1.4.  His BUN was also elevated at 68 and his bicarb is decreased at 83  Family states he has not been feeling well for the last week.  He has not been eating or drinking as well.  Has had a sore throat.  He did urinate this morning but has noticed he has not been urinating as much.  He does not feel bladder swelling or distention  Home Medications Prior to Admission medications   Medication Sig Start Date End Date Taking? Authorizing Provider  albuterol (PROVENTIL) (2.5 MG/3ML) 0.083% nebulizer solution Take 3 mLs (2.5 mg total) by nebulization every 6 (six) hours as needed for wheezing or shortness of breath. 02/21/22   Donato Schultz, DO  allopurinol (ZYLOPRIM) 100 MG tablet Take 1 tablet (100 mg total) by mouth daily. 11/12/22   Donato Schultz, DO  amoxicillin (AMOXIL) 875 MG tablet Take 1 tablet (875 mg total) by mouth 2 (two) times daily for 7 days. 07/28/23 08/04/23  Trevor Iha, FNP  atorvastatin (LIPITOR) 80 MG tablet Take 1 tablet (80 mg total) by mouth daily. 05/12/23     benzonatate (TESSALON) 200 MG capsule Take 1 capsule (200 mg total) by mouth 3 (three) times daily as needed for up to 7 days.  07/23/23 07/30/23  Trevor Iha, FNP  budesonide-formoterol (SYMBICORT) 80-4.5 MCG/ACT inhaler Inhale 2 puffs into the lungs daily. 06/17/23   Donato Schultz, DO  celecoxib (CELEBREX) 200 MG capsule Take 1 capsule (200 mg total) by mouth 2 (two) times daily. 04/17/23   Seabron Spates R, DO  fenofibrate 160 MG tablet TAKE 1 TABLET BY MOUTH DAILY 10/04/22   Zola Button, Grayling Congress, DO  Ferrous Sulfate (IRON PO) Take 1 tablet by mouth daily.    [provider]  fluticasone (FLONASE) 50 MCG/ACT nasal spray Place 2 sprays into both nostrils daily. 09/10/19   Donato Schultz, DO  Fluticasone-Umeclidin-Vilant (TRELEGY ELLIPTA) 100-62.5-25 MCG/ACT AEPB Inhale 1 puff into the lungs daily. 07/07/23   Donato Schultz, DO  furosemide (LASIX) 40 MG tablet Take 1 tablet (40 mg total) by mouth 2 (two) times daily. 05/12/23     gabapentin (NEURONTIN) 100 MG capsule Take 2 capsules (200 mg total) by mouth at bedtime. 05/27/23   Seabron Spates R, DO  glucose blood (ONETOUCH ULTRA) test strip USE AS DIRECTED 3 TIMES  DAILY 05/27/23   Zola Button, Grayling Congress, DO  levocetirizine (XYZAL) 5 MG tablet Take 1 tablet (5 mg total) by mouth every evening. 05/12/23   Zola Button, Grayling Congress, DO  meclizine (ANTIVERT) 25 MG tablet Take 1 tablet (25 mg total)  by mouth 3 (three) times daily as needed for dizziness. 02/14/20   Seabron Spates R, DO  memantine (NAMENDA) 10 MG tablet Take 2 tablets (20 mg total) by mouth daily. 05/27/23   Donato Schultz, DO  metFORMIN (GLUCOPHAGE-XR) 500 MG 24 hr tablet Take 1 tablet (500 mg) by mouth daily with breakfast. 06/27/23   Zola Button, Grayling Congress, DO  Metoprolol Tartrate 75 MG TABS Take 1 tablet (75 mg total) by mouth 2 (two) times daily. 05/12/23     Multiple Vitamins-Minerals (ONE-A-DAY WEIGHT SMART ADVANCE PO) Take 1 tablet by mouth daily. Centrum Silver    [provider]  nitroGLYCERIN (NITROSTAT) 0.4 MG SL tablet Place 1 tablet (0.4 mg total) under the  tongue every 5 (five) minutes as needed for chest pain. 01/30/23   Copland, Gwenlyn Found, MD  omeprazole (PRILOSEC) 40 MG capsule Take 1 capsule (40 mg total) by mouth 2 (two) times daily shortly before a meal (breakfast and dinner). 04/11/23   Seabron Spates R, DO  potassium chloride SA (KLOR-CON M) 20 MEQ tablet Take 2 tablets (40 mEq total) by mouth daily. 05/12/23   Donato Schultz, DO  predniSONE (DELTASONE) 20 MG tablet Take 3 tabs PO daily x 5 days. 07/28/23   Trevor Iha, FNP  PROAIR HFA 108 (90 Base) MCG/ACT inhaler Inhale 1 puff into the lungs every 6 (six) hours as needed for wheezing or shortness of breath. 06/28/20   Donato Schultz, DO  rivaroxaban (XARELTO) 10 MG TABS tablet Take 1 tablet (10 mg total) by mouth daily. 05/01/23   Donato Schultz, DO  Semaglutide (OZEMPIC, 0.25 OR 0.5 MG/DOSE, [place]) Inject 0.5 mg into the skin once a week.    [provider]  tadalafil, PAH, (ADCIRCA) 20 MG tablet Take by mouth.    [provider]  topiramate (TOPAMAX) 50 MG tablet Take 1 tablet (50 mg total) by mouth 2 (two) times daily. 01/24/23   Van Clines, MD  vitamin B-12 (CYANOCOBALAMIN) 1000 MCG tablet Take 1,000 mcg by mouth daily.    [provider]      Allergies    Bee venom and Garlic    Review of Systems   Review of Systems  Physical Exam Updated Vital Signs BP 119/80   Pulse 67   Temp 97.8 F (36.6 C) (Oral)   Resp 19   SpO2 100%  Physical Exam Vitals and nursing note reviewed.  Constitutional:      Appearance: He is well-developed. He is not diaphoretic.  HENT:     Head: Normocephalic and atraumatic.     Right Ear: External ear normal.     Left Ear: External ear normal.     Mouth/Throat:     Mouth: Mucous membranes are dry.     Pharynx: No oropharyngeal exudate.     Comments: No uvular or tonsillar edema noted on exam Eyes:     General: No scleral icterus.       Right eye: No discharge.        Left eye: No discharge.      Conjunctiva/sclera: Conjunctivae normal.  Neck:     Trachea: No tracheal deviation.  Cardiovascular:     Rate and Rhythm: Normal rate and regular rhythm.  Pulmonary:     Effort: Pulmonary effort is normal. No respiratory distress.     Breath sounds: Normal breath sounds. No stridor. No wheezing or rales.  Abdominal:     General:  Bowel sounds are normal. There is no distension.     Palpations: Abdomen is soft.     Tenderness: There is no abdominal tenderness. There is no guarding or rebound.  Musculoskeletal:        General: No tenderness or deformity.     Cervical back: Neck supple.  Skin:    General: Skin is warm and dry.     Findings: No rash.  Neurological:     General: No focal deficit present.     Mental Status: He is alert.     Cranial Nerves: No cranial nerve deficit, dysarthria or facial asymmetry.     Sensory: No sensory deficit.     Motor: No abnormal muscle tone or seizure activity.     Coordination: Coordination normal.  Psychiatric:        Mood and Affect: Mood normal.     ED Results / Procedures / Treatments   Labs (all labs ordered are listed, but only abnormal results are displayed) Labs Reviewed  COMPREHENSIVE METABOLIC PANEL - Abnormal; Notable for the following components:      Result Value   Sodium 130 (*)    CO2 18 (*)    Glucose, Bld 164 (*)    BUN 66 (*)    Creatinine, Ser 3.65 (*)    Albumin 2.2 (*)    AST 54 (*)    ALT 68 (*)    Alkaline Phosphatase 127 (*)    GFR, Estimated 17 (*)    All other components within normal limits  CBC WITH DIFFERENTIAL/PLATELET - Abnormal; Notable for the following components:   WBC 11.7 (*)    RBC 3.78 (*)    Hemoglobin 10.3 (*)    HCT 32.9 (*)    RDW 16.2 (*)    Neutro Abs 9.9 (*)    Abs Immature Granulocytes 0.43 (*)    All other components within normal limits  URINALYSIS, ROUTINE W REFLEX MICROSCOPIC - Abnormal; Notable for the following components:   APPearance HAZY (*)    Hgb urine dipstick  MODERATE (*)    Leukocytes,Ua LARGE (*)    Bacteria, UA MANY (*)    All other components within normal limits  SARS CORONAVIRUS 2 BY RT PCR  GROUP A STREP BY PCR  URINE CULTURE  I-STAT CG4 LACTIC ACID, ED  I-STAT CG4 LACTIC ACID, ED    EKG None  Radiology DG Chest 2 View  Result Date: 07/28/2023 CLINICAL DATA:  Cough/fatigue x 1 week EXAM: CHEST - 2 VIEW COMPARISON:  Chest x-ray 08/24/2022, CT chest 04/02/2023 FINDINGS: The heart and mediastinal contours are unchanged. Aortic calcification. Low lung volumes. Left base atelectasis. No focal consolidation. No pulmonary edema. No pleural effusion. No pneumothorax. No acute osseous abnormality. IMPRESSION: Low lung volumes with no active cardiopulmonary disease. Electronically Signed   By: Tish Frederickson M.D.   On: 07/28/2023 13:05    Procedures Procedures    Medications Ordered in ED Medications  sodium chloride 0.9 % bolus 1,000 mL (0 mLs Intravenous Stopped 07/28/23 2113)    Followed by  0.9 %  sodium chloride infusion (1,000 mLs Intravenous New Bag/Given 07/28/23 2007)  cefTRIAXone (ROCEPHIN) 1 g in sodium chloride 0.9 % 100 mL IVPB (1 g Intravenous New Bag/Given 07/28/23 2114)    ED Course/ Medical Decision Making/ A&P Clinical Course as of 07/28/23 2123  Mon Jul 28, 2023  2108 Bladder scan shows 300 cc.  UA suggestive of uti [JK]  2108 COvid negatiave.  Strep negative.  Metabolic panel with elevated bun and creatinine [JK]  2123 Patient discussed with Dr. Imogene Burn. [JK]    Clinical Course User Index [JK] Linwood Dibbles, MD                                 Medical Decision Making Amount and/or Complexity of Data Reviewed Labs: ordered.  Risk Prescription drug management. Decision regarding hospitalization.   Patient presented to the ED for evaluation of elevated BUN and creatinine.  Patient has not been feeling well for several days.  He went to an urgent care also complaining of sore throat.  Laboratory tests reveal elevated  BUN and creatinine.  Patient's bladder scan showed slight lower 300 cc in his bladder.  Not consistent with acute urinary retention.  Patient does have a leukocytosis and persistent elevated BUN/creatinine.  Suspect this is related to dehydration decreased p.o. intake.  Patient has been started on IV fluids.  Patient also mentioned decreased urine output but his urinalysis also does suggest infection with many bacteria pyuria and leukocyte esterase.  Will start him on a course of Rocephin.  Lactic acid not elevated, doubt evolving sepsis.  Will plan on admission to the hospital for IV fluids continue antibiotic treatment and further evaluation        Final Clinical Impression(s) / ED Diagnoses Final diagnoses:  AKI (acute kidney injury) (HCC)  Acute cystitis without hematuria  Dehydration    Rx / DC Orders ED Discharge Orders     None         Linwood Dibbles, MD 07/28/23 2123

## 2023-07-28 NOTE — ED Notes (Signed)
Stat labs picked up at [facility] at 1355

## 2023-07-28 NOTE — Assessment & Plan Note (Signed)
On lopressor, lipitor.

## 2023-07-29 ENCOUNTER — Inpatient Hospital Stay (HOSPITAL_COMMUNITY): Payer: PPO

## 2023-07-29 ENCOUNTER — Ambulatory Visit: Payer: PPO | Admitting: Family Medicine

## 2023-07-29 DIAGNOSIS — E1121 Type 2 diabetes mellitus with diabetic nephropathy: Secondary | ICD-10-CM | POA: Diagnosis present

## 2023-07-29 DIAGNOSIS — I7090 Unspecified atherosclerosis: Secondary | ICD-10-CM

## 2023-07-29 DIAGNOSIS — E1151 Type 2 diabetes mellitus with diabetic peripheral angiopathy without gangrene: Secondary | ICD-10-CM | POA: Diagnosis present

## 2023-07-29 DIAGNOSIS — Z86711 Personal history of pulmonary embolism: Secondary | ICD-10-CM | POA: Diagnosis not present

## 2023-07-29 DIAGNOSIS — N179 Acute kidney failure, unspecified: Secondary | ICD-10-CM | POA: Diagnosis present

## 2023-07-29 DIAGNOSIS — Z7984 Long term (current) use of oral hypoglycemic drugs: Secondary | ICD-10-CM | POA: Diagnosis not present

## 2023-07-29 DIAGNOSIS — I2510 Atherosclerotic heart disease of native coronary artery without angina pectoris: Secondary | ICD-10-CM | POA: Diagnosis present

## 2023-07-29 DIAGNOSIS — E785 Hyperlipidemia, unspecified: Secondary | ICD-10-CM | POA: Diagnosis present

## 2023-07-29 DIAGNOSIS — N201 Calculus of ureter: Secondary | ICD-10-CM | POA: Diagnosis not present

## 2023-07-29 DIAGNOSIS — Z79899 Other long term (current) drug therapy: Secondary | ICD-10-CM | POA: Diagnosis not present

## 2023-07-29 DIAGNOSIS — I110 Hypertensive heart disease with heart failure: Secondary | ICD-10-CM | POA: Diagnosis present

## 2023-07-29 DIAGNOSIS — Z7951 Long term (current) use of inhaled steroids: Secondary | ICD-10-CM | POA: Diagnosis not present

## 2023-07-29 DIAGNOSIS — G4733 Obstructive sleep apnea (adult) (pediatric): Secondary | ICD-10-CM | POA: Diagnosis present

## 2023-07-29 DIAGNOSIS — Z1152 Encounter for screening for COVID-19: Secondary | ICD-10-CM | POA: Diagnosis not present

## 2023-07-29 DIAGNOSIS — Z87891 Personal history of nicotine dependence: Secondary | ICD-10-CM | POA: Diagnosis not present

## 2023-07-29 DIAGNOSIS — Z85828 Personal history of other malignant neoplasm of skin: Secondary | ICD-10-CM | POA: Diagnosis not present

## 2023-07-29 DIAGNOSIS — I130 Hypertensive heart and chronic kidney disease with heart failure and stage 1 through stage 4 chronic kidney disease, or unspecified chronic kidney disease: Secondary | ICD-10-CM | POA: Diagnosis not present

## 2023-07-29 DIAGNOSIS — N182 Chronic kidney disease, stage 2 (mild): Secondary | ICD-10-CM | POA: Diagnosis not present

## 2023-07-29 DIAGNOSIS — E1169 Type 2 diabetes mellitus with other specified complication: Secondary | ICD-10-CM | POA: Diagnosis present

## 2023-07-29 DIAGNOSIS — E871 Hypo-osmolality and hyponatremia: Secondary | ICD-10-CM | POA: Diagnosis present

## 2023-07-29 DIAGNOSIS — B965 Pseudomonas (aeruginosa) (mallei) (pseudomallei) as the cause of diseases classified elsewhere: Secondary | ICD-10-CM | POA: Diagnosis present

## 2023-07-29 DIAGNOSIS — E860 Dehydration: Secondary | ICD-10-CM | POA: Diagnosis present

## 2023-07-29 DIAGNOSIS — Z7985 Long-term (current) use of injectable non-insulin antidiabetic drugs: Secondary | ICD-10-CM | POA: Diagnosis not present

## 2023-07-29 DIAGNOSIS — Z7901 Long term (current) use of anticoagulants: Secondary | ICD-10-CM | POA: Diagnosis not present

## 2023-07-29 DIAGNOSIS — I5032 Chronic diastolic (congestive) heart failure: Secondary | ICD-10-CM | POA: Diagnosis not present

## 2023-07-29 DIAGNOSIS — E1122 Type 2 diabetes mellitus with diabetic chronic kidney disease: Secondary | ICD-10-CM | POA: Diagnosis not present

## 2023-07-29 DIAGNOSIS — N136 Pyonephrosis: Secondary | ICD-10-CM | POA: Diagnosis present

## 2023-07-29 DIAGNOSIS — E872 Acidosis, unspecified: Secondary | ICD-10-CM | POA: Diagnosis present

## 2023-07-29 LAB — GLUCOSE, CAPILLARY
Glucose-Capillary: 216 mg/dL — ABNORMAL HIGH (ref 70–99)
Glucose-Capillary: 211 mg/dL — ABNORMAL HIGH (ref 70–99)
Glucose-Capillary: 195 mg/dL — ABNORMAL HIGH (ref 70–99)
Glucose-Capillary: 135 mg/dL — ABNORMAL HIGH (ref 70–99)
Glucose-Capillary: 125 mg/dL — ABNORMAL HIGH (ref 70–99)

## 2023-07-29 MED ORDER — INSULIN ASPART 100 UNIT/ML IJ SOLN
0.0000 [IU] | Freq: Three times a day (TID) | INTRAMUSCULAR | Status: DC
  Administered 2023-07-29: 3 [IU] via SUBCUTANEOUS
  Administered 2023-07-30: 4 [IU] via SUBCUTANEOUS
  Administered 2023-07-30 – 2023-07-31 (×4): 3 [IU] via SUBCUTANEOUS
  Administered 2023-08-01: 4 [IU] via SUBCUTANEOUS

## 2023-07-29 MED ORDER — LACTATED RINGERS IV SOLN: INTRAVENOUS | Status: DC

## 2023-07-29 MED ORDER — INSULIN ASPART 100 UNIT/ML IJ SOLN
0.0000 [IU] | Freq: Every day | INTRAMUSCULAR | Status: DC
  Administered 2023-07-31: 3 [IU] via SUBCUTANEOUS

## 2023-07-29 NOTE — Progress Notes (Signed)
ABI's have been completed. Preliminary results can be found in CV Proc through chart review.   07/29/23 3:04 PM Olen Cordial RVT

## 2023-07-29 NOTE — Progress Notes (Signed)
[facility] PROGRESS NOTE  Garrett [name] (DOB: 1951-08-19) ZOX:[id number] PCP: Zola Button, Grayling Congress, DO  Brief Narrative: Garrett [name] is a 72 y.o. male with a history of chronic HFpEF, recurrent PE, CAD, T2DM, HTN, HLD who presented to the ED on 07/28/2023 from urgent care due to recent URI symptoms and poor per oral intake with AKI.   Subjective: Feels better than at admission.   Objective: BP 105/66 (BP Location: Right Arm)   Pulse 73   Temp 97.9 F (36.6 C)   Resp 16   Wt 84.1 kg   SpO2 99%   BMI 27.38 kg/m   Gen: No distress Pulm: Clear, nonlabored  CV: RRR, no MRG, no edema GI: Soft, NT, ND, +BS Neuro: Alert and oriented. No new focal deficits. Ext: Warm, no deformities. Intact cap refill Skin: No rashes, lesions or ulcers on visualized skin   Assessment & Plan: AKI, NAGMA:  - Continue holding lasix, celebrex, metformin, avoid other nephrotoxins - Continue IVF, decrease rate - Check renal lytes and U/S. With BPH, continue flomax. Urinary hesitancy ?from inflammation from UTI.   UTI:  - Continue ceftriaxone pending urine culture.   CAD, HLD:  - Continue BB, statin  Chronic HFpEF:  - Monitor I/O, weights, volume status. Judicious IVF.  T2DM: HbA1c 7%. - Augment to resistant SSI  HTN:  - Continue metoprolol  Cold right foot:  - ABI formal read pending, cap refill is intact, no suspicion for limb threatening ischemia.   History of PE:  - Continue xarelto.  OSA:  - CPAP qHS  Garrett Nine, MD [facility] [email or web address] 07/29/2023, 3:47 PM

## 2023-07-29 NOTE — Progress Notes (Signed)
   07/29/23 0303  BiPAP/CPAP/SIPAP  Reason BIPAP/CPAP not in use Non-compliant   Pt refused CPAP for night time use.

## 2023-07-30 ENCOUNTER — Inpatient Hospital Stay (HOSPITAL_COMMUNITY): Payer: PPO

## 2023-07-30 DIAGNOSIS — N179 Acute kidney failure, unspecified: Secondary | ICD-10-CM | POA: Diagnosis not present

## 2023-07-30 LAB — GLUCOSE, CAPILLARY
Glucose-Capillary: 139 mg/dL — ABNORMAL HIGH (ref 70–99)
Glucose-Capillary: 158 mg/dL — ABNORMAL HIGH (ref 70–99)
Glucose-Capillary: 123 mg/dL — ABNORMAL HIGH (ref 70–99)
Glucose-Capillary: 150 mg/dL — ABNORMAL HIGH (ref 70–99)

## 2023-07-30 MED ORDER — MAGIC MOUTHWASH W/LIDOCAINE
10.0000 mL | Freq: Once | ORAL | Status: AC
  Administered 2023-07-30: 10 mL via ORAL
  Filled 2023-07-30: qty 10

## 2023-07-30 MED ORDER — CIPROFLOXACIN HCL 500 MG PO TABS
500.0000 mg | ORAL_TABLET | Freq: Every day | ORAL | Status: DC
  Administered 2023-07-30: 500 mg via ORAL
  Filled 2023-07-30: qty 1

## 2023-07-30 NOTE — Progress Notes (Signed)
[facility] PROGRESS NOTE  Garrett [name] (DOB: [birth year]) [id number] PCP: Zola Button, Grayling Congress, DO  Brief Narrative: Garrett [name] is a 72 y.o. male with a history of chronic HFpEF, recurrent PE, CAD, T2DM, HTN, HLD who presented to the ED on 07/28/2023 from urgent care due to recent URI symptoms and poor per oral intake with AKI.   Subjective: Feels better, urine output picking up, he's starting to eat better. No flank/abd pain or gross hematuria.   Objective: BP 113/76 (BP Location: Left Arm)   Pulse 94   Temp 98.1 F (36.7 C) (Oral)   Resp 18   Wt 84.1 kg   SpO2 95%   BMI 27.38 kg/m   Gen: No distress Pulm: Clear, nonlabored  CV: RRR, no MRG or pitting edema GI: Soft, NT, ND, +BS Neuro: Alert and oriented. No new focal deficits. Ext: Warm, no deformities. Skin: No new rashes, lesions or ulcers on visualized skin   Assessment & Plan: AKI, NAGMA:  - Continue holding lasix, celebrex, metformin, avoid other nephrotoxins - Continue IVF, decreased rate - Renal U/S with fullness in R collecting system, also microscopic hematuria noted so will check CT abd/pelvis w/o contrast.  - Renal function improving, no where near his baseline still. Mild acidosis will also be monitored on AM labs.   BPH:  - Continue flomax. Urinary hesitancy ?from inflammation from UTI.   UTI: Pseudomonas by culture.  - Change to ciprofloxacin (FQ is only oral option), dosing will be 500mg  daily based on current renal function. Will monitor closely.   CAD, HLD:  - Continue BB, statin  Chronic HFpEF:  - Monitor I/O, weights, volume status. Judicious IVF.  T2DM: HbA1c 7%. - Augment to resistant SSI  HTN:  - Continue metoprolol  Cold right foot: This is warmer, pt reports no numbness or pain.  - ABIs show moderate predominantly small vessel PVD [ABIs normal at 1.1 bilaterally, TBIs 0.7 (R) and 0.65 (L)], can f/u with vascular surgery after discharge.  History of PE:  - Continue  xarelto.  OSA:  - CPAP qHS  Tyrone Nine, MD [facility] [email or web address] 07/30/2023, 10:49 AM
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
## 2023-07-30 NOTE — Evaluation (Signed)
Physical Therapy Evaluation Patient Details Name: Garrett [name] MRN: [id number] DOB: March 12, 1951 Today's Date: 07/30/2023  History of Present Illness  72 y.o. male presents to [facility] on 07[facility]28/2023 with URI symptoms and poor PO intake as well as AKI. PMH includes chronic HFpEF, PE, CAD, HTN, DMII, HLD.  Clinical Impression  Pt presents to PT with deficits in functional mobility, gait, balance, endurance, strength, power. Pt ambulates for household distances with support of RW, fatigued after ambulating during this session. Pt makes multiple attempts at standing prior to success, and will benefit from more frequent mobilization to improve LE power and balance. PT recommends discharge home when medically appropriate. PT encourages frequent out of bed mobility in an effort to return to baseline. Pt may benefit from assessment of gait with SPC next session as this is what the pt typically utilizes in the home.        If plan is discharge home, recommend the following: A little help with bathing/dressing/bathroom;Assistance with cooking/housework;Assist for transportation;Help with stairs or ramp for entrance   Can travel by private vehicle        Equipment Recommendations None recommended by PT  Recommendations for Other Services       Functional Status Assessment Patient has had a recent decline in their functional status and demonstrates the ability to make significant improvements in function in a reasonable and predictable amount of time.     Precautions / Restrictions Precautions Precautions: Fall Restrictions Weight Bearing Restrictions: No      Mobility  Bed Mobility Overal bed mobility: Needs Assistance Bed Mobility: Supine to Sit, Sit to Supine     Supine to sit: Supervision Sit to supine: Supervision        Transfers Overall transfer level: Needs assistance Equipment used: Rolling walker (2 wheels) Transfers: Sit to/from Stand Sit to Stand: Contact guard  assist                Ambulation/Gait Ambulation/Gait assistance: Supervision Gait Distance (Feet): 250 Feet Assistive device: Rolling walker (2 wheels) Gait Pattern/deviations: Step-through pattern Gait velocity: reduced Gait velocity interpretation: <1.8 ft/sec, indicate of risk for recurrent falls   General Gait Details: slowed step-through gait, increased time for changes in direction  Stairs            Wheelchair Mobility     Tilt Bed    Modified Rankin (Stroke Patients Only)       Balance Overall balance assessment: Needs assistance Sitting-balance support: No upper extremity supported, Feet supported Sitting balance-Leahy Scale: Good     Standing balance support: Single extremity supported, Bilateral upper extremity supported, Reliant on assistive device for balance Standing balance-Leahy Scale: Poor                               Pertinent Vitals/Pain Pain Assessment Pain Assessment: 0-10 Pain Score: 6  Pain Location: back Pain Descriptors / Indicators: Aching Pain Intervention(s): Monitored during session    Home Living Family/patient expects to be discharged to:: Private residence Living Arrangements: Spouse/significant other Available Help at Discharge: Family;Available 24 hours/day Type of Home: House Home Access: Stairs to enter Entrance Stairs-Rails: Left Entrance Stairs-Number of Steps: 3   Home Layout: One level Home Equipment: Agricultural consultant (2 wheels);Rollator (4 wheels);Electric scooter;Cane - single point      Prior Function Prior Level of Function : Independent/Modified Independent             Mobility Comments:  ambulates with SPC in the home, utilizes electric scooter in the community       Extremity/Trunk Assessment   Upper Extremity Assessment Upper Extremity Assessment: Overall WFL for tasks assessed    Lower Extremity Assessment Lower Extremity Assessment: Generalized weakness    Cervical /  Trunk Assessment Cervical / Trunk Assessment: Kyphotic  Communication   Communication Communication: Hearing impairment;Other (comment) (cochlear implant) Cueing Techniques: Verbal cues  Cognition Arousal: Alert Behavior During Therapy: WFL for tasks assessed/performed Overall Cognitive Status: Within Functional Limits for tasks assessed                                          General Comments General comments (skin integrity, edema, etc.): VSS on RA    Exercises     Assessment/Plan    PT Assessment Patient needs continued PT services  PT Problem List Decreased strength;Decreased activity tolerance;Decreased balance;Decreased mobility;Decreased knowledge of use of DME;Pain       PT Treatment Interventions DME instruction;Gait training;Stair training;Functional mobility training;Therapeutic activities;Therapeutic exercise;Balance training;Neuromuscular re-education;Patient/family education    PT Goals (Current goals can be found in the Care Plan section)  Acute Rehab PT Goals Patient Stated Goal: to return home PT Goal Formulation: With patient Time For Goal Achievement: 08/13/23 Potential to Achieve Goals: Good    Frequency Min 1X/week     Co-evaluation               AM-PAC PT "6 Clicks" Mobility  Outcome Measure Help needed turning from your back to your side while in a flat bed without using bedrails?: A Little Help needed moving from lying on your back to sitting on the side of a flat bed without using bedrails?: A Little Help needed moving to and from a bed to a chair (including a wheelchair)?: A Little Help needed standing up from a chair using your arms (e.g., wheelchair or bedside chair)?: A Little Help needed to walk in hospital room?: A Little Help needed climbing 3-5 steps with a railing? : A Lot 6 Click Score: 17    End of Session   Activity Tolerance: Patient tolerated treatment well Patient left: in bed;with call bell/phone  within reach;with family/visitor present Nurse Communication: Mobility status PT Visit Diagnosis: Other abnormalities of gait and mobility (R26.89);Muscle weakness (generalized) (M62.81)    Time: 2595-6387 PT Time Calculation (min) (ACUTE ONLY): 21 min   Charges:   PT Evaluation $PT Eval Low Complexity: 1 Low   PT General Charges $$ ACUTE PT VISIT: 1 Visit         Arlyss Gandy, PT, DPT Acute Rehabilitation Office [phone]   Arlyss Gandy 07/30/2023, 5:03 PM

## 2023-07-30 NOTE — TOC Initial Note (Signed)
Transition of Care (TOC) - Initial/Assessment Note   Spoke to patient at bedside.   Confirmed face sheet information  Patient from home with wife. Has walker, cane, shower chair at home.   Has transportation to appointments and can get prescriptions filled.  Patient Details  Name: Garrett [name] MRN: [id number] Date of Birth: 1951/07/06  Transition of Care [facility]) CM/SW Contact:    Kingsley Plan, RN Phone Number: 07/30/2023, 11:44 AM  Clinical Narrative:                   Expected Discharge Plan: Home/Self Care Barriers to Discharge: Continued Medical Work up   Patient Goals and CMS Choice Patient states their goals for this hospitalization and ongoing recovery are:: to return to home     [place] ownership interest in [facility].provided to:: Patient    Expected Discharge Plan and Services   Discharge Planning Services: CM Consult Post Acute Care Choice: NA Living arrangements for the past 2 months: Single Family Home                 DME Arranged: N/A DME Agency: NA       HH Arranged: NA          Prior Living Arrangements/Services Living arrangements for the past 2 months: Single Family Home Lives with:: Spouse Patient language and need for interpreter reviewed:: Yes Do you feel safe going back to the place where you live?: Yes      Need for Family Participation in Patient Care: Yes (Comment) Care giver support system in place?: Yes (comment) Current home services: DME Criminal Activity/Legal Involvement Pertinent to Current Situation/Hospitalization: No - Comment as needed  Activities of Daily Living Home Assistive Devices/Equipment: None ADL Screening (condition at time of admission) Patient's cognitive ability adequate to safely complete daily activities?: Yes Is the patient deaf or have difficulty hearing?: Yes (with hearing aid and cochlear implant) Does the patient have difficulty seeing, even when wearing glasses/contacts?:  No Does the patient have difficulty concentrating, remembering, or making decisions?: No Patient able to express need for assistance with ADLs?: No Does the patient have difficulty dressing or bathing?: No Independently performs ADLs?: Yes (appropriate for developmental age) Does the patient have difficulty walking or climbing stairs?: Yes Weakness of Legs: Both Weakness of Arms/Hands: None  Permission Sought/Granted   Permission granted to share information with : No              Emotional Assessment Appearance:: Appears stated age Attitude/Demeanor/Rapport: Engaged Affect (typically observed): Accepting Orientation: : Oriented to Self, Oriented to Place, Oriented to  Time, Oriented to Situation Alcohol / Substance Use: Not Applicable Psych Involvement: No (comment)  Admission diagnosis:  Dehydration [E86.0] Acute cystitis without hematuria [N30.00] AKI (acute kidney injury) (HCC) [N17.9] Patient Active Problem List   Diagnosis Date Noted   AKI (acute kidney injury) (HCC) 07/28/2023   Acute cystitis without hematuria 07/28/2023   Cold right foot 07/28/2023   Normocytic anemia 07/28/2023   Hyponatremia 07/28/2023   Urinary hesitancy 06/27/2023   Need for influenza vaccination 12/10/2022   Uncontrolled type 2 diabetes mellitus with hyperglycemia (HCC) 06/07/2022   Hematuria 05/03/2022   Weakness of both lower extremities 02/25/2022   Frequent falls 02/25/2022   Coronary artery disease involving native coronary artery of native heart without angina pectoris 02/17/2022   Sensorineural hearing loss (SNHL) of both ears 02/17/2022   DOE (dyspnea on exertion) 05/11/2021   Abnormal stress test 04/20/2021  S/P Nissen fundoplication (without gastrostomy tube) procedure 02/20/2021   Body mass index (BMI) 33.0-33.9, adult 11/03/2019   Chronic diastolic CHF (congestive heart failure) (HCC) 01/07/2019   Preventative health care 02/10/2018   Spondylolisthesis at L3-L4 level  10/28/2017   Cough variant asthma  vs uacs/ pseudoasthma 06/17/2017   Recurrent pulmonary embolism (HCC) 02/04/2017   Degenerative arthritis of knee, bilateral 10/08/2016   Upper airway cough syndrome 03/22/2016   Multiple pulmonary nodules 03/22/2016   Headache disorder 01/04/2016   History of colonic polyps 04/11/2015   Lumbar stenosis with neurogenic claudication 11/14/2014   Spondylolysis of cervical region 02/25/2014   Lumbosacral spondylosis without myelopathy 01/06/2014   OSA (obstructive sleep apnea) 12/08/2013   SOB (shortness of breath) on exertion 11/04/2013   Morbid obesity due to excess calories (HCC)    Venous insufficiency of leg 02/18/2012   Itching 02/18/2012   Mild dementia (HCC) 05/28/2011   Hyperlipidemia associated with type 2 diabetes mellitus (HCC) 05/27/2011   Controlled type 2 diabetes mellitus with diabetic nephropathy (HCC) 04/10/2011   Gout 04/10/2011   Primary hypertension 04/10/2011   OA (osteoarthritis) 04/10/2011   GERD (gastroesophageal reflux disease) 04/10/2011   Migraine headache without aura 04/10/2011   Allergic rhinitis, cause unspecified 04/10/2011   Bee sting allergy 04/10/2011   Generalized osteoarthritis of multiple sites 04/10/2011   Hypertension associated with stage 2 chronic kidney disease due to type 2 diabetes mellitus (HCC) 04/10/2011   PCP:  Donato Schultz, DO Pharmacy:   [facility] [address], [place] - [address] [place] [address] Phone: [phone] Fax: [phone]  [place] [facility] [address] [place] [address] Phone: [phone] Fax: [phone]     Social Determinants of Health (SDOH) Social History: SDOH Screenings   Food Insecurity: No Food Insecurity (07/29/2023)  Housing: Patient Declined (07/29/2023)  Transportation Needs: No Transportation Needs (07/29/2023)  Utilities: Not At Risk (07/29/2023)  Alcohol Screen: Low Risk   (07/07/2023)  Depression (PHQ2-9): Low Risk  (07/08/2023)  Financial Resource Strain: Low Risk  (07/07/2023)  Recent Concern: Financial Resource Strain - Medium Risk (05/04/2023)   Received from [facility]  Physical Activity: Insufficiently Active (07/07/2023)  Social Connections: Socially Integrated (07/07/2023)  Stress: No Stress Concern Present (07/07/2023)  Tobacco Use: Medium Risk (07/28/2023)  Health Literacy: Adequate Health Literacy (07/08/2023)   SDOH Interventions:     Readmission Risk Interventions     No data to display

## 2023-07-31 ENCOUNTER — Inpatient Hospital Stay (HOSPITAL_COMMUNITY): Payer: PPO

## 2023-07-31 ENCOUNTER — Encounter (HOSPITAL_COMMUNITY)
Admission: EM | Disposition: A | Discharge status: Home / Self Care | Payer: Self-pay | Source: Home / Self Care | Attending: Family Medicine

## 2023-07-31 ENCOUNTER — Encounter (HOSPITAL_COMMUNITY): Payer: Self-pay | Admitting: Internal Medicine

## 2023-07-31 ENCOUNTER — Inpatient Hospital Stay (HOSPITAL_COMMUNITY): Payer: PPO | Admitting: Anesthesiology

## 2023-07-31 ENCOUNTER — Encounter: Payer: PPO | Admitting: Physical Therapy

## 2023-07-31 DIAGNOSIS — E1122 Type 2 diabetes mellitus with diabetic chronic kidney disease: Secondary | ICD-10-CM | POA: Diagnosis not present

## 2023-07-31 DIAGNOSIS — N182 Chronic kidney disease, stage 2 (mild): Secondary | ICD-10-CM

## 2023-07-31 DIAGNOSIS — I130 Hypertensive heart and chronic kidney disease with heart failure and stage 1 through stage 4 chronic kidney disease, or unspecified chronic kidney disease: Secondary | ICD-10-CM | POA: Diagnosis not present

## 2023-07-31 DIAGNOSIS — N201 Calculus of ureter: Secondary | ICD-10-CM

## 2023-07-31 DIAGNOSIS — Z87891 Personal history of nicotine dependence: Secondary | ICD-10-CM

## 2023-07-31 DIAGNOSIS — I5032 Chronic diastolic (congestive) heart failure: Secondary | ICD-10-CM | POA: Diagnosis not present

## 2023-07-31 DIAGNOSIS — N179 Acute kidney failure, unspecified: Secondary | ICD-10-CM | POA: Diagnosis not present

## 2023-07-31 HISTORY — PX: CYSTOSCOPY WITH RETROGRADE PYELOGRAM, URETEROSCOPY AND STENT PLACEMENT: SHX5789

## 2023-07-31 LAB — GLUCOSE, CAPILLARY
Glucose-Capillary: 136 mg/dL — ABNORMAL HIGH (ref 70–99)
Glucose-Capillary: 138 mg/dL — ABNORMAL HIGH (ref 70–99)
Glucose-Capillary: 126 mg/dL — ABNORMAL HIGH (ref 70–99)
Glucose-Capillary: 103 mg/dL — ABNORMAL HIGH (ref 70–99)
Glucose-Capillary: 126 mg/dL — ABNORMAL HIGH (ref 70–99)
Glucose-Capillary: 273 mg/dL — ABNORMAL HIGH (ref 70–99)

## 2023-07-31 SURGERY — CYSTOURETEROSCOPY, WITH RETROGRADE PYELOGRAM AND STENT INSERTION
Anesthesia: General | Laterality: Right

## 2023-07-31 MED ORDER — PROPOFOL 10 MG/ML IV BOLUS
INTRAVENOUS | Status: AC
  Filled 2023-07-31: qty 20

## 2023-07-31 MED ORDER — LACTATED RINGERS IV SOLN: INTRAVENOUS | Status: DC

## 2023-07-31 MED ORDER — HYDROMORPHONE HCL 1 MG/ML IJ SOLN
0.5000 mg | INTRAMUSCULAR | Status: DC | PRN
  Administered 2023-07-31: 1 mg via INTRAVENOUS
  Filled 2023-07-31 (×2): qty 1

## 2023-07-31 MED ORDER — FENTANYL CITRATE (PF) 100 MCG/2ML IJ SOLN
INTRAMUSCULAR | Status: DC | PRN
  Administered 2023-07-31: 25 ug via INTRAVENOUS

## 2023-07-31 MED ORDER — FENTANYL CITRATE (PF) 250 MCG/5ML IJ SOLN
INTRAMUSCULAR | Status: AC
  Filled 2023-07-31: qty 5

## 2023-07-31 MED ORDER — FENTANYL CITRATE (PF) 100 MCG/2ML IJ SOLN
INTRAMUSCULAR | Status: AC
  Filled 2023-07-31: qty 2

## 2023-07-31 MED ORDER — PROPOFOL 1000 MG/100ML IV EMUL
INTRAVENOUS | Status: AC
  Filled 2023-07-31: qty 100

## 2023-07-31 MED ORDER — LIDOCAINE HCL (PF) 2 % IJ SOLN
INTRAMUSCULAR | Status: AC
  Filled 2023-07-31: qty 5

## 2023-07-31 MED ORDER — CIPROFLOXACIN IN D5W 400 MG/200ML IV SOLN
400.0000 mg | INTRAVENOUS | Status: DC
  Filled 2023-07-31: qty 200

## 2023-07-31 MED ORDER — SODIUM CHLORIDE 0.9 % IR SOLN
Status: DC | PRN
  Administered 2023-07-31: 3000 mL via INTRAVESICAL

## 2023-07-31 MED ORDER — IOHEXOL 300 MG/ML  SOLN
INTRAMUSCULAR | Status: DC | PRN
  Administered 2023-07-31: 4 mL

## 2023-07-31 MED ORDER — CIPROFLOXACIN IN D5W 400 MG/200ML IV SOLN
INTRAVENOUS | Status: DC | PRN
  Administered 2023-07-31: 400 mg via INTRAVENOUS

## 2023-07-31 MED ORDER — LIDOCAINE HCL URETHRAL/MUCOSAL 2 % EX GEL
CUTANEOUS | Status: AC
  Filled 2023-07-31: qty 11

## 2023-07-31 MED ORDER — INSULIN ASPART 100 UNIT/ML IJ SOLN: 0.0000 [IU] | INTRAMUSCULAR | Status: DC | PRN

## 2023-07-31 MED ORDER — PROPOFOL 10 MG/ML IV BOLUS
INTRAVENOUS | Status: DC | PRN
Start: 2023-07-31 — End: 2023-07-31
  Administered 2023-07-31: 150 mg via INTRAVENOUS
  Administered 2023-07-31: 50 mg via INTRAVENOUS

## 2023-07-31 MED ORDER — ONDANSETRON HCL 4 MG/2ML IJ SOLN
INTRAMUSCULAR | Status: AC
  Filled 2023-07-31: qty 2

## 2023-07-31 MED ORDER — ONDANSETRON HCL 4 MG/2ML IJ SOLN
INTRAMUSCULAR | Status: DC | PRN
  Administered 2023-07-31: 4 mg via INTRAVENOUS

## 2023-07-31 MED ORDER — MIDAZOLAM HCL 2 MG/2ML IJ SOLN
INTRAMUSCULAR | Status: AC
  Filled 2023-07-31: qty 2

## 2023-07-31 MED ORDER — DEXAMETHASONE SODIUM PHOSPHATE 10 MG/ML IJ SOLN
INTRAMUSCULAR | Status: AC
  Filled 2023-07-31: qty 1

## 2023-07-31 MED ORDER — SODIUM CHLORIDE 0.9 % IV SOLN: INTRAVENOUS | Status: DC

## 2023-07-31 MED ORDER — LIDOCAINE 2% (20 MG/ML) 5 ML SYRINGE
INTRAMUSCULAR | Status: DC | PRN
  Administered 2023-07-31: 60 mg via INTRAVENOUS

## 2023-07-31 MED ORDER — CHLORHEXIDINE GLUCONATE 0.12 % MT SOLN
15.0000 mL | Freq: Once | OROMUCOSAL | Status: AC
  Administered 2023-07-31: 15 mL via OROMUCOSAL

## 2023-07-31 MED ORDER — DEXAMETHASONE SODIUM PHOSPHATE 10 MG/ML IJ SOLN
INTRAMUSCULAR | Status: DC | PRN
  Administered 2023-07-31: 4 mg via INTRAVENOUS

## 2023-07-31 SURGICAL SUPPLY — 19 items
BAG COUNTER SPONGE SURGICOUNT (BAG) ×1 IMPLANT
BAG SPNG CNTER NS LX DISP (BAG) ×1
BAG URO CATCHER STRL LF (MISCELLANEOUS) ×1 IMPLANT
CATH URETL OPEN END 6FR 70 (CATHETERS) ×1 IMPLANT
ELECT REM PT RETURN 9FT ADLT (ELECTROSURGICAL)
ELECTRODE REM PT RTRN 9FT ADLT (ELECTROSURGICAL) IMPLANT
EXTRACTOR STONE NITINOL NGAGE (UROLOGICAL SUPPLIES) IMPLANT
FIBER LASER TRAC TIP (UROLOGICAL SUPPLIES) ×1 IMPLANT
GLOVE BIOGEL M STRL SZ7.5 (GLOVE) ×1 IMPLANT
GOWN STRL REUS W/ TWL LRG LVL3 (GOWN DISPOSABLE) ×2 IMPLANT
GOWN STRL REUS W/TWL LRG LVL3 (GOWN DISPOSABLE) ×2
GUIDEWIRE ANG ZIPWIRE 038X150 (WIRE) IMPLANT
GUIDEWIRE STR DUAL SENSOR (WIRE) ×1 IMPLANT
MANIFOLD NEPTUNE II (INSTRUMENTS) ×2 IMPLANT
PACK CYSTO (CUSTOM PROCEDURE TRAY) ×1 IMPLANT
SHEATH URETERAL 12FRX35CM (MISCELLANEOUS) IMPLANT
SOL PREP POV-IOD 4OZ 10% (MISCELLANEOUS) ×1 IMPLANT
STENT URET 6FRX26 CONTOUR (STENTS) IMPLANT
TUBE CONNECTING 12X1/4 (SUCTIONS) ×1 IMPLANT

## 2023-07-31 NOTE — Anesthesia Preprocedure Evaluation (Signed)
Anesthesia Evaluation  Patient identified by MRN, date of birth, ID band Patient awake    Reviewed: Allergy & Precautions, NPO status , Patient's Chart, lab work & pertinent test results  Airway Mallampati: I  TM Distance: >3 FB Neck ROM: Full    Dental no notable dental hx. (+) Edentulous Upper, Edentulous Lower   Pulmonary asthma , sleep apnea and Continuous Positive Airway Pressure Ventilation , former smoker   Pulmonary exam normal breath sounds clear to auscultation       Cardiovascular hypertension, Pt. on medications Normal cardiovascular exam Rhythm:Regular Rate:Normal  07/2021 Echo  1. Left ventricular ejection fraction, by estimation, is 60 to 65%. The  left ventricle has normal function. The left ventricle has no regional  wall motion abnormalities. Left ventricular diastolic parameters were  normal.   2. Right ventricular systolic function is normal. The right ventricular  size is normal.   3. The mitral valve is normal in structure. No evidence of mitral valve  regurgitation. No evidence of mitral stenosis.   4. The aortic valve is tricuspid. Aortic valve regurgitation is trivial.  Mild aortic valve sclerosis is present, with no evidence of aortic valve  stenosis.   5. The inferior vena cava is normal in size with greater than 50%  respiratory variability, suggesting right atrial pressure of 3 mmHg.     Neuro/Psych    GI/Hepatic hiatal hernia,GERD  Medicated and Controlled,,  Endo/Other  diabetes    Renal/GU ARFRenal diseaseLab Results      Component                Value               Date                      NA                       137                 07/31/2023                CL                       107                 07/31/2023                K                        3.7                 07/31/2023                CO2                      20 (L)              07/31/2023                BUN                       53 (H)              07/31/2023                CREATININE  2.22 (H)            07/31/2023                GFRNONAA                 31 (L)              07/31/2023                CALCIUM                  8.6 (L)             07/31/2023                PHOS                     3.7                 07/31/2023                ALBUMIN                  1.9 (L)             07/31/2023                GLUCOSE                  133 (H)             07/31/2023                Musculoskeletal   Abdominal   Peds  Hematology  (+) Blood dyscrasia, anemia Lab Results      Component                Value               Date                      WBC                      10.5                07/31/2023                HGB                      9.6 (L)             07/31/2023                HCT                      30.3 (L)            07/31/2023                MCV                      87.6                07/31/2023                PLT                      356                 07/31/2023  Anesthesia Other Findings   Reproductive/Obstetrics                              Anesthesia Physical Anesthesia Plan  ASA: 3  Anesthesia Plan: General   Post-op Pain Management:    Induction: Intravenous  PONV Risk Score and Plan: 3 and Treatment may vary due to age or medical condition, Ondansetron and Midazolam  Airway Management Planned: LMA  Additional Equipment: None  Intra-op Plan:   Post-operative Plan:   Informed Consent: I have reviewed the patients History and Physical, chart, labs and discussed the procedure including the risks, benefits and alternatives for the proposed anesthesia with the patient or authorized representative who has indicated his/her understanding and acceptance.     Dental advisory given  Plan Discussed with:   Anesthesia Plan Comments:          Anesthesia Quick Evaluation

## 2023-07-31 NOTE — Op Note (Signed)
Preoperative diagnosis:  Right ureteral stone Acute kidney injury UTI   Postoperative diagnosis:  Right ureteral stone Acute kidney injury UTI   Procedure:  Cystoscopy Right ureteral stent placement (6 x 26 - no string)  Right retrograde pyelography with interpretation  Right ureteroscopy  Surgeon: Moody Bruins. M.D.  Anesthesia: General  Complications: None  Intraoperative findings: Right retrograde pyelography was performed with a 6 French ureteral catheter and Omnipaque contrast.  This demonstrated a filling defect within the distal right ureter consistent with the patient's known calculus without other abnormalities.  EBL: Minimal  Specimens: None  Indication: Garrett [name] is a 72 y.o. patient with right ureteral obstruction related to a right ureteral stone.  He also has a Pseudomonas urinary tract infection and acute kidney injury.  After reviewing the management options for treatment, he elected to proceed with the above surgical procedure(s). We have discussed the potential benefits and risks of the procedure, side effects of the proposed treatment, the likelihood of the patient achieving the goals of the procedure, and any potential problems that might occur during the procedure or recuperation. Informed consent has been obtained.  Description of procedure:  The patient was taken to the operating room and general anesthesia was induced.  The patient was placed in the dorsal lithotomy position, prepped and draped in the usual sterile fashion, and preoperative antibiotics were administered. A preoperative time-out was performed.   Cystourethroscopy was performed.  The patient's urethra was examined and was normal. The bladder was then systematically examined in its entirety. There was no evidence for any bladder tumors, stones, or other mucosal pathology.    Attention then turned to the right ureteral orifice and a ureteral catheter was used to intubate the  ureteral orifice.  Omnipaque contrast was injected through the ureteral catheter and a retrograde pyelogram was performed with findings as dictated above.  A 0.38 sensor guidewire was then advanced up the right ureter into the renal pelvis under fluoroscopic guidance.  The wire was then backloaded through the cystoscope and a ureteral stent was advance over the wire using Seldinger technique.  The stent was positioned appropriately under fluoroscopic and cystoscopic guidance.  The wire was then removed with an adequate stent curl noted in the renal pelvis as well as in the bladder.  As the wire was removed, the ureteral stent retracted up into the distal ureter.  Semirigid ureteroscopy was then performed with an engage basket which was able to manipulate the stent back into the bladder.  Appropriate positioning was then confirmed under fluoroscopic guidance but proximally and distally.  The bladder was then emptied and the procedure ended.  The patient appeared to tolerate the procedure well and without complications.  The patient was able to be awakened and transferred to the recovery unit in satisfactory condition.    Moody Bruins MD

## 2023-07-31 NOTE — Plan of Care (Signed)
  Problem: Coping: Goal: Ability to adjust to condition or change in health will improve Outcome: Progressing   Problem: Fluid Volume: Goal: Ability to maintain a balanced intake and output will improve Outcome: Progressing   Problem: Health Behavior/Discharge Planning: Goal: Ability to identify and utilize available resources and services will improve Outcome: Progressing Goal: Ability to manage health-related needs will improve Outcome: Progressing   Problem: Metabolic: Goal: Ability to maintain appropriate glucose levels will improve Outcome: Progressing   Problem: Nutritional: Goal: Maintenance of adequate nutrition will improve Outcome: Progressing Goal: Progress toward achieving an optimal weight will improve Outcome: Progressing   Problem: Skin Integrity: Goal: Risk for impaired skin integrity will decrease Outcome: Progressing   Problem: Tissue Perfusion: Goal: Adequacy of tissue perfusion will improve Outcome: Progressing   Problem: Education: Goal: Knowledge of General Education information will improve Description: Including pain rating scale, medication(s)/side effects and non-pharmacologic comfort measures Outcome: Progressing   Problem: Health Behavior/Discharge Planning: Goal: Ability to manage health-related needs will improve Outcome: Progressing   Problem: Clinical Measurements: Goal: Ability to maintain clinical measurements within normal limits will improve Outcome: Progressing Goal: Will remain free from infection Outcome: Progressing Goal: Diagnostic test results will improve Outcome: Progressing Goal: Respiratory complications will improve Outcome: Progressing Goal: Cardiovascular complication will be avoided Outcome: Progressing   Problem: Activity: Goal: Risk for activity intolerance will decrease Outcome: Progressing   Problem: Nutrition: Goal: Adequate nutrition will be maintained Outcome: Progressing   Problem: Coping: Goal:  Level of anxiety will decrease Outcome: Progressing   Problem: Elimination: Goal: Will not experience complications related to bowel motility Outcome: Progressing Goal: Will not experience complications related to urinary retention Outcome: Progressing   Problem: Pain Managment: Goal: General experience of comfort will improve Outcome: Progressing   Problem: Safety: Goal: Ability to remain free from injury will improve Outcome: Progressing   Problem: Skin Integrity: Goal: Risk for impaired skin integrity will decrease Outcome: Progressing   

## 2023-07-31 NOTE — Transfer of Care (Signed)
Immediate Anesthesia Transfer of Care Note  Patient: Garrett [name]  Procedure(s) Performed: CYSTOSCOPY WITH RETROGRADE PYELOGRAM, URETEROSCOPY AND STENT PLACEMENT (Right)  Patient Location: PACU  Anesthesia Type:General  Level of Consciousness: drowsy  Airway & Oxygen Therapy: Patient Spontanous Breathing and Patient connected to face mask oxygen  Post-op Assessment: Report given to RN, Post -op Vital signs reviewed and stable, and Patient moving all extremities X 4  Post vital signs: Reviewed and stable  Last Vitals:  Vitals Value Taken Time  BP 123/73 07/31/23 1654  Temp    Pulse 67   Resp 12 07/31/23 1654  SpO2 96   Vitals shown include unfiled device data.  Last Pain:  Vitals:   07/31/23 1531  TempSrc: Oral  PainSc:          Complications: No notable events documented.

## 2023-07-31 NOTE — Progress Notes (Signed)
Patient ID: Garrett [name], male   DOB: Apr 29, 1951, 72 y.o.   MRN: [id number]  Day of Surgery Subjective: Pt doing well.  Still with right flank pain but well controlled.  Objective: Vital signs in last 24 hours: Temp:  [98 F (36.7 C)-98.5 F (36.9 C)] 98 F (36.7 C) (08/08 0947) Pulse Rate:  [75-84] 80 (08/08 0947) Resp:  [16-18] 18 (08/08 0947) BP: (106-140)/(70-80) 106/80 (08/08 0947) SpO2:  [94 %-96 %] 96 % (08/08 0947) Weight:  [82.1 kg] 82.1 kg (08/08 0953)  Intake/Output from previous day: 08/07 0701 - 08/08 0700 In: 2113.9 [P.O.:200; I.V.:1913.9] Out: -  Intake/Output this shift: No intake/output data recorded.  Physical Exam:  General: Alert and oriented Abdomen: Soft, ND, Minimal right CVAT  Lab Results: Recent Labs    07/28/23 1958 07/29/23 0015 07/31/23 0021  HGB 10.3* 9.7* 9.6*  HCT 32.9* 30.8* 30.3*   BMET Recent Labs    07/30/23 0203 07/31/23 0021  NA 138 137  K 3.6 3.7  CL 102 107  CO2 21* 20*  GLUCOSE 152* 133*  BUN 63* 53*  CREATININE 2.73* 2.22*  CALCIUM 8.9 8.6*     Studies/Results:   Assessment/Plan: 1) Right ureteral stone/AKI/UTI: Cr improved slightly.  Plan for cystoscopy and right ureteral stone today. I discussed the potential benefits and risks of the procedure, side effects of the proposed treatment, the likelihood of the patient achieving the goals of the procedure, and any potential problems that might occur during the procedure or recuperation. He gives informed consent.  Will need outpatient follow up with Dr. Annabell Howells for definitive stone treatment after resolution of AKI and treatment of infection.    LOS: 2 days   [name] 07/31/2023, 10:25 AM

## 2023-07-31 NOTE — Progress Notes (Signed)
[facility] PROGRESS NOTE  Garrett [name] (DOB: 29-Aug-1951) YQM:[id number] PCP: Zola Button, Grayling Congress, DO  Brief Narrative: Garrett [name] is a 72 y.o. male with a history of chronic HFpEF, recurrent PE, CAD, T2DM, HTN, HLD who presented to the ED on 07/28/2023 from urgent care due to recent URI symptoms and poor per oral intake with AKI. Work up revealed an obstructing right ureterolithiasis for which stent placement is planned 8/8.   Subjective: Pt seen this evening after returning from [facility] for procedure which ultimately had to be performed at [facility]. He has burning with urination after catheterization, no other complaints. Eating a sandwich. Denies dyspnea  Objective: BP 106/80   Pulse 80   Temp 98 F (36.7 C) (Oral)   Resp 18   Ht 5\' 9"  (1.753 m)   Wt 82.1 kg   SpO2 96%   BMI 26.73 kg/m   No distress, alert and bright eyed with family in room Crackles at bases improving with coughs, nonlabored, no wheezes RRR, no MRG trace pitting edema L foot is slightly cold, not right foot. No numbness, weakness, tingling. DP pulses palpable bilaterally Alert, oriented, no focal deficits.   Assessment & Plan: AKI, NAGMA: Prerenal etiology as evidenced by improvement with IVF (3.6 > 2.2) though suspect obstructive uropathy as well.  - Continue holding lasix, celebrex, metformin, avoid other nephrotoxins - DC IVF once taking po post-procedure - Renal U/S with fullness in R collecting system, also microscopic hematuria > CT showed an obstructing 8mm distal ureteral stone for which urology was consulted and plans stent placement 8/8. Will need f/u with Dr. Annabell Howells post discharge for definitive management.  BPH:  - Continue flomax.  UTI: Pseudomonas by culture.  - Changed to ciprofloxacin (FQ is only oral option), dosing will be 500mg  daily based on current renal function. Will monitor closely.   CAD, HLD:  - Continue BB, statin  Chronic HFpEF:  - Monitor I/O, weights, volume status.  Judicious IVF.  T2DM: HbA1c 7%. - Augment to resistant SSI  HTN:  - Continue metoprolol  Cold right foot: This is warmer, pt reports no numbness or pain.  - ABIs show moderate predominantly small vessel PVD [ABIs normal at 1.1 bilaterally, TBIs 0.7 (R) and 0.65 (L)], can f/u with vascular surgery after discharge. D/w family.  History of PE:  - Continue xarelto.  OSA:  - CPAP qHS  Tyrone Nine, MD [facility] [email or web address] 07/31/2023, 1:57 PM

## 2023-07-31 NOTE — Progress Notes (Signed)
CARELINK has been scheduled for the pt @ 0900. Consent has been signed and is in the chart.

## 2023-07-31 NOTE — H&P (View-Only) (Signed)
 Patient ID: Garrett [name], male   DOB: Apr 29, 1951, 72 y.o.   MRN: [id number]  Day of Surgery Subjective: Pt doing well.  Still with right flank pain but well controlled.  Objective: Vital signs in last 24 hours: Temp:  [98 F (36.7 C)-98.5 F (36.9 C)] 98 F (36.7 C) (08/08 0947) Pulse Rate:  [75-84] 80 (08/08 0947) Resp:  [16-18] 18 (08/08 0947) BP: (106-140)/(70-80) 106/80 (08/08 0947) SpO2:  [94 %-96 %] 96 % (08/08 0947) Weight:  [82.1 kg] 82.1 kg (08/08 0953)  Intake/Output from previous day: 08/07 0701 - 08/08 0700 In: 2113.9 [P.O.:200; I.V.:1913.9] Out: -  Intake/Output this shift: No intake/output data recorded.  Physical Exam:  General: Alert and oriented Abdomen: Soft, ND, Minimal right CVAT  Lab Results: Recent Labs    07/28/23 1958 07/29/23 0015 07/31/23 0021  HGB 10.3* 9.7* 9.6*  HCT 32.9* 30.8* 30.3*   BMET Recent Labs    07/30/23 0203 07/31/23 0021  NA 138 137  K 3.6 3.7  CL 102 107  CO2 21* 20*  GLUCOSE 152* 133*  BUN 63* 53*  CREATININE 2.73* 2.22*  CALCIUM 8.9 8.6*     Studies/Results:   Assessment/Plan: 1) Right ureteral stone/AKI/UTI: Cr improved slightly.  Plan for cystoscopy and right ureteral stone today. I discussed the potential benefits and risks of the procedure, side effects of the proposed treatment, the likelihood of the patient achieving the goals of the procedure, and any potential problems that might occur during the procedure or recuperation. He gives informed consent.  Will need outpatient follow up with Dr. Annabell Howells for definitive stone treatment after resolution of AKI and treatment of infection.    LOS: 2 days   Garrett [name] 07/31/2023, 10:25 AM

## 2023-07-31 NOTE — Anesthesia Procedure Notes (Signed)
Procedure Name: LMA Insertion Date/Time: 07/31/2023 4:00 PM  Performed by: Nelle Don, CRNAPre-anesthesia Checklist: Patient identified, Emergency Drugs available, Suction available and Patient being monitored Patient Re-evaluated:Patient Re-evaluated prior to induction Oxygen Delivery Method: Circle system utilized Preoxygenation: Pre-oxygenation with 100% oxygen Induction Type: IV induction LMA: LMA inserted LMA Size: 4.0 Number of attempts: 1 Dental Injury: Teeth and Oropharynx as per pre-operative assessment

## 2023-08-01 ENCOUNTER — Encounter (HOSPITAL_COMMUNITY): Payer: Self-pay | Admitting: Urology

## 2023-08-01 DIAGNOSIS — N179 Acute kidney failure, unspecified: Secondary | ICD-10-CM | POA: Diagnosis not present

## 2023-08-01 LAB — GLUCOSE, CAPILLARY: Glucose-Capillary: 193 mg/dL — ABNORMAL HIGH (ref 70–99)

## 2023-08-01 MED ORDER — CIPROFLOXACIN HCL 500 MG PO TABS
500.0000 mg | ORAL_TABLET | Freq: Two times a day (BID) | ORAL | Status: DC
  Administered 2023-08-01: 500 mg via ORAL
  Filled 2023-08-01: qty 1

## 2023-08-01 MED ORDER — TAMSULOSIN HCL 0.4 MG PO CAPS: 0.4000 mg | ORAL_CAPSULE | Freq: Every day | ORAL | 0 refills | Status: DC

## 2023-08-01 MED ORDER — CIPROFLOXACIN HCL 500 MG PO TABS: 500.0000 mg | ORAL_TABLET | Freq: Two times a day (BID) | ORAL | 0 refills | Status: AC

## 2023-08-01 NOTE — Progress Notes (Signed)
Patient ID: Garrett [name], male   DOB: 06-06-1951, 72 y.o.   MRN: [id number]  1 Day Post-Op Subjective: Pt doing well.  Still with right flank pain but well controlled.  Objective: Vital signs in last 24 hours: Temp:  [97 F (36.1 C)-98.2 F (36.8 C)] 98.2 F (36.8 C) (08/09 0742) Pulse Rate:  [59-73] 69 (08/09 0742) Resp:  [13-20] 16 (08/09 0742) BP: (102-132)/(64-88) 120/73 (08/09 0742) SpO2:  [93 %-100 %] 100 % (08/09 0742) Weight:  [81 kg-82 kg] 81 kg (08/09 0420)  Intake/Output from previous day: 08/08 0701 - 08/09 0700 In: 1000 [I.V.:800; IV Piggyback:200] Out: 1200 [Urine:1200] Intake/Output this shift: No intake/output data recorded.  Physical Exam:  General: Alert and oriented Abdomen: Soft, ND, Minimal right CVAT  Lab Results: Recent Labs    07/31/23 0021 07/31/23 2328  HGB 9.6* 9.2*  HCT 30.3* 30.1*   BMET Recent Labs    07/31/23 0021 07/31/23 2328  NA 137 136  K 3.7 3.9  CL 107 110  CO2 20* 16*  GLUCOSE 133* 197*  BUN 53* 44*  CREATININE 2.22* 1.84*  CALCIUM 8.6* 8.3*     Studies/Results:   Assessment/Plan: 1) Right ureteral stone/AKI/UTI:  - improvement in Scr yesterday. Today's labs unavailable.  -s/p cysto stent with Dr. Laverle Patter 8/8 - Will see Dr. Annabell Howells for definitive stone mgmt. In a few weeks after AKI and UTI have resolved.  -reviewed case and plan with he and his son today. -Okay to discharge from a urologic perspective.    LOS: 3 days   Scherrie Bateman  08/01/2023, 11:16 AM

## 2023-08-01 NOTE — Discharge Summary (Signed)
Physician Discharge Summary   Patient: Garrett [name] MRN: [id number] DOB: Aug 20, 1951  Admit date:     07/28/2023  Discharge date: 08/01/23  Discharge Physician: Tyrone Nine   PCP: Donato Schultz, DO   Recommendations at discharge:  Follow up with PCP in 1-2 weeks, will need repeat BMP Follow up with [facility], Dr. Annabell Howells, for definitive management of right ureterolithiasis s/p stent 8/8 by Dr. Laverle Patter. Follow up with vascular surgery postdischarge due to abnormal TBIs.   Discharge Diagnoses: Principal Problem:   AKI (acute kidney injury) (HCC) Active Problems:   Acute cystitis without hematuria   Cold right foot   Controlled type 2 diabetes mellitus with diabetic nephropathy (HCC)   Primary hypertension   Migraine headache without aura   Hyperlipidemia associated with type 2 diabetes mellitus (HCC)   OSA (obstructive sleep apnea)   Recurrent pulmonary embolism (HCC)   Chronic diastolic CHF (congestive heart failure) (HCC)   Coronary artery disease involving native coronary artery of native heart without angina pectoris   Normocytic anemia   Hyponatremia  Hospital Course: Garrett [name] is a 72 y.o. male with a history of chronic HFpEF, recurrent PE, CAD, T2DM, HTN, HLD who presented to the ED on 07/28/2023 from urgent care due to recent URI symptoms and poor per oral intake with AKI. Work up revealed an obstructing right ureterolithiasis for which stent was placed 8/8. Renal function has continued improvement and he is hemodynamically stable for discharge. Urine culture grew Pseudomonas which is treated with ciprofloxacin at discharge. Will need urology follow up for definitive stone management. Return precautions provided.   Assessment and Plan: AKI, NAGMA: Prerenal etiology as evidenced by improvement with IVF (3.6 > 2.2) though suspect obstructive uropathy as well (creatinine down to 1.84 on day of discharge).  - Continue holding celebrex until follow up - Good  urine output and oral intake.  - Continue flomax    - Renal U/S with fullness in R collecting system, also microscopic hematuria > CT showed an obstructing 8mm distal ureteral stone for which urology was consulted and placed right ureteral stent 8/8. Will need f/u with Dr. Annabell Howells post discharge for definitive management. - Acidosis attributed to topamax.   BPH:  - Continue flomax.   UTI: Pseudomonas by culture.  - Changed to ciprofloxacin (FQ is only oral option), dosing will be 500mg  BID since CrCl >23ml/min on day of discharge and expected to continue improvement.     CAD, HLD:  - Continue BB, statin   Chronic HFpEF:  - Monitor I/O, weights, volume status. Now taking normal po and renal function improving, will restart home HF medications including lasix.   T2DM: HbA1c 7%. Continue home Tx.   HTN:  - Continue metoprolol   Cold right foot: This is warmer, pt reports no numbness or pain.  - ABIs show moderate predominantly small vessel PVD [ABIs normal at 1.1 bilaterally, TBIs 0.7 (R) and 0.65 (L)], can f/u with vascular surgery after discharge. D/w family.   History of PE:  - Continue xarelto.  Consultants: Urology, pharmacy Procedures performed:  Procedure:   Cystoscopy Right ureteral stent placement (6 x 26 - no string)  Right retrograde pyelography with interpretation  Right ureteroscopy   Surgeon: Moody Bruins. M.D.  Disposition: Home Diet recommendation: Heart healthy DISCHARGE MEDICATION: Allergies as of 08/01/2023       Reactions   Bee Venom Anaphylaxis   Garlic Swelling, Anaphylaxis, Other (See Comments)   Allergic to  all forms of garlic, including powder. ECG 03/06/22        Medication List     STOP taking these medications    amoxicillin 875 MG tablet Commonly known as: AMOXIL   benzonatate 200 MG capsule Commonly known as: TESSALON   budesonide-formoterol 80-4.5 MCG/ACT inhaler Commonly known as: Symbicort   celecoxib 200 MG  capsule Commonly known as: CELEBREX   predniSONE 20 MG tablet Commonly known as: DELTASONE       TAKE these medications    allopurinol 100 MG tablet Commonly known as: ZYLOPRIM Take 1 tablet (100 mg total) by mouth daily.   atorvastatin 80 MG tablet Commonly known as: LIPITOR Take 1 tablet (80 mg total) by mouth daily.   ciprofloxacin 500 MG tablet Commonly known as: CIPRO Take 1 tablet (500 mg total) by mouth 2 (two) times daily for 7 days.   cyanocobalamin 1000 MCG tablet Commonly known as: VITAMIN B12 Take 1,000 mcg by mouth daily.   fenofibrate 160 MG tablet TAKE 1 TABLET BY MOUTH DAILY   fluticasone 50 MCG/ACT nasal spray Commonly known as: FLONASE Place 2 sprays into both nostrils daily.   furosemide 40 MG tablet Commonly known as: LASIX Take 1 tablet (40 mg total) by mouth 2 (two) times daily.   gabapentin 100 MG capsule Commonly known as: NEURONTIN Take 2 capsules (200 mg total) by mouth at bedtime.   IRON PO Take 1 tablet by mouth daily.   levocetirizine 5 MG tablet Commonly known as: XYZAL Take 1 tablet (5 mg total) by mouth every evening.   memantine 10 MG tablet Commonly known as: NAMENDA Take 2 tablets (20 mg total) by mouth daily.   metFORMIN 500 MG 24 hr tablet Commonly known as: GLUCOPHAGE-XR Take 1 tablet (500 mg) by mouth daily with breakfast.   Metoprolol Tartrate 75 MG Tabs Take 1 tablet (75 mg total) by mouth 2 (two) times daily.   nitroGLYCERIN 0.4 MG SL tablet Commonly known as: NITROSTAT Place 1 tablet (0.4 mg total) under the tongue every 5 (five) minutes as needed for chest pain.   omeprazole 40 MG capsule Commonly known as: PRILOSEC Take 1 capsule (40 mg total) by mouth 2 (two) times daily shortly before a meal (breakfast and dinner).   ONE-A-DAY WEIGHT SMART ADVANCE PO Take 1 tablet by mouth daily. Centrum Silver   OneTouch Ultra test strip Generic drug: glucose blood USE AS DIRECTED 3 TIMES  DAILY   OZEMPIC (0.25  OR 0.5 MG/DOSE)  [place] Inject 0.5 mg into the skin once a week. Sunday   potassium chloride SA 20 MEQ tablet Commonly known as: KLOR-CON M Take 2 tablets (40 mEq total) by mouth daily.   ProAir HFA 108 (90 Base) MCG/ACT inhaler Generic drug: albuterol Inhale 1 puff into the lungs every 6 (six) hours as needed for wheezing or shortness of breath.   tamsulosin 0.4 MG Caps capsule Commonly known as: FLOMAX Take 1 capsule (0.4 mg total) by mouth daily.   topiramate 50 MG tablet Commonly known as: TOPAMAX Take 1 tablet (50 mg total) by mouth 2 (two) times daily.   Trelegy Ellipta 100-62.5-25 MCG/ACT Aepb Generic drug: Fluticasone-Umeclidin-Vilant Inhale 1 puff into the lungs daily.   Xarelto 10 MG Tabs tablet Generic drug: rivaroxaban Take 1 tablet (10 mg total) by mouth daily.        Follow-up Information     Donato Schultz, DO Follow up.   Specialty: Family Medicine Contact information: [address] [place] [address] [phone]         Bjorn Pippin, MD. Schedule an appointment as soon as possible for a visit in 1 week(s).   Specialty: Urology Contact information: [address] [place] [address] [phone]                Discharge Exam: Filed Weights   07/31/23 0953 07/31/23 1531 08/01/23 0420  Weight: 82.1 kg 82 kg 81 kg  BP 120/73 (BP Location: Right Arm)   Pulse 69   Temp 98.2 F (36.8 C) (Oral)   Resp 16   Ht 5\' 9"  (1.753 m)   Wt 81 kg   SpO2 100%   BMI 26.37 kg/m   No distress, sitting up in bed having eaten all of breakfast. Urine is now clear yellow and less and less painful with each void since surgery Cochlear implant noted Soft, NT, ND, +BS Clear, nonlabored RRR, no edema  Condition at discharge: stable  The results of significant diagnostics from this hospitalization (including imaging, microbiology, ancillary and laboratory) are listed below for reference.   Imaging Studies: DG C-Arm 1-60 Min-No  Report  Result Date: 07/31/2023 Fluoroscopy was utilized by the requesting physician.  No radiographic interpretation.   CT ABDOMEN PELVIS WO CONTRAST  Result Date: 07/30/2023 CLINICAL DATA:  Hydronephrosis hematuria, right hydronephrosis EXAM: CT ABDOMEN AND PELVIS WITHOUT CONTRAST TECHNIQUE: Multidetector CT imaging of the abdomen and pelvis was performed following the standard protocol without IV contrast. RADIATION DOSE REDUCTION: This exam was performed according to the departmental dose-optimization program which includes automated exposure control, adjustment of the mA and/or kV according to patient size and/or use of iterative reconstruction technique. COMPARISON:  CT 03/02/2023, ultrasound 07/29/2023 FINDINGS: Lower chest: Bibasilar atelectasis with band-like opacity in the right lower lobe, unchanged. Heart size is normal. Hepatobiliary: Liver measures 23 cm in length. Stable hepatic cyst. Additional subcentimeter low-density lesions within the liver remain too small to characterize. Unremarkable gallbladder. No hyperdense gallstone. No biliary dilatation. Pancreas: Unremarkable. No pancreatic ductal dilatation or surrounding inflammatory changes. Spleen: Normal in size without focal abnormality. Adrenals/Urinary Tract: Unremarkable adrenal glands. 1.3 cm stone within the lower pole of the right kidney. There is a 0.8 cm stone within the distal right ureter at the level of the pelvic sidewall. Mild right hydroureteronephrosis. Nonobstructing 0.4 cm stone within the left kidney. No left-sided ureteral calculi. No left hydronephrosis. Urinary bladder within normal limits. Stomach/Bowel: Moderate volume of ingested material within the stomach. Distal esophagus is fluid-filled. No dilated loops of bowel. Normal appendix in the right lower quadrant. Scattered colonic diverticulosis. No focal bowel wall thickening or inflammatory changes. Vascular/Lymphatic: Aortic atherosclerosis. No enlarged abdominal or  pelvic lymph nodes. Reproductive: Mild prostatomegaly. Other: No ascites or pneumoperitoneum. Small fat containing inguinal hernias. Musculoskeletal: No new or acute bony abnormality. IMPRESSION: 1. Obstructing 8 mm stone within the distal right ureter at the level of the pelvic sidewall. Mild right hydroureteronephrosis. 2. Additional bilateral nonobstructing renal calculi, as above. 3. Hepatomegaly. 4. Colonic diverticulosis without evidence of acute diverticulitis. 5. Mild prostatomegaly. 6. Aortic atherosclerosis (ICD10-I70.0). Electronically Signed   By: Duanne Guess D.O.   On: 07/30/2023 12:59   VAS [place] ABI WITH/WO TBI  Result Date: 07/29/2023  LOWER EXTREMITY DOPPLER STUDY Patient Name:  Garrett [name]  Date of Exam:   07/29/2023 Medical Rec #: [id number]         Accession #:    [id number] Date of Birth: 1951/07/11  Patient Gender: M Patient Age:   1 years Exam Location:  [facility] Procedure:      VAS [place] ABI WITH/WO TBI Referring Phys: ERIC CHEN --------------------------------------------------------------------------------  Indications: Peripheral artery disease. High Risk Factors: Hypertension, hyperlipidemia, Diabetes.  Comparison Study: No prior studies. Performing Technologist: Olen Cordial RVT  Examination Guidelines: A complete evaluation includes at minimum, Doppler waveform signals and systolic blood pressure reading at the level of bilateral brachial, anterior tibial, and posterior tibial arteries, when vessel segments are accessible. Bilateral testing is considered an integral part of a complete examination. Photoelectric Plethysmograph (PPG) waveforms and toe systolic pressure readings are included as required and additional duplex testing as needed. Limited examinations for reoccurring indications may be performed as noted.  ABI Findings: +---------+------------------+-----+-----------+--------+ Right    Rt Pressure (mmHg)IndexWaveform   Comment   +---------+------------------+-----+-----------+--------+ Brachial 119                    triphasic           +---------+------------------+-----+-----------+--------+ PTA      133               1.12 multiphasic         +---------+------------------+-----+-----------+--------+ DP       119               1.00 multiphasic         +---------+------------------+-----+-----------+--------+ Great Toe83                0.70                     +---------+------------------+-----+-----------+--------+ +---------+------------------+-----+-----------+-------+ Left     Lt Pressure (mmHg)IndexWaveform   Comment +---------+------------------+-----+-----------+-------+ Brachial 115                    triphasic          +---------+------------------+-----+-----------+-------+ PTA      132               1.11 multiphasic        +---------+------------------+-----+-----------+-------+ DP       126               1.06 multiphasic        +---------+------------------+-----+-----------+-------+ Great Toe77                0.65                    +---------+------------------+-----+-----------+-------+ +-------+-----------+-----------+------------+------------+ ABI/TBIToday's ABIToday's TBIPrevious ABIPrevious TBI +-------+-----------+-----------+------------+------------+ Right  1.12       0.7                                 +-------+-----------+-----------+------------+------------+ Left   1.11       0.65                                +-------+-----------+-----------+------------+------------+  Summary: Right: Resting right ankle-brachial index is within normal range. The right toe-brachial index is normal. Left: Resting left ankle-brachial index is within normal range. The left toe-brachial index is abnormal. *See table(s) above for measurements and observations.  Electronically signed by Waverly Ferrari MD on 07/29/2023 at 5:45:21 PM.    Final    US  RENAL  Result Date: 07/29/2023 CLINICAL DATA:  Acute kidney insufficiency EXAM: RENAL / URINARY TRACT ULTRASOUND COMPLETE COMPARISON:  CT 08/14/2021. Renal ultrasound 10/25/2022. Images only. No report FINDINGS: Right Kidney: Renal measurements: 12.0 x 5.9 x 6.7 cm = volume: 247.5 mL. Mild collecting system dilatation. No perinephric fluid. Left Kidney: Renal measurements: 11.0 x 6.4 x 6.5 cm = volume: 237.5 mL. Echogenicity within normal limits. No mass or hydronephrosis visualized. There are some echogenic areas along the collecting system. Possible small stones. One focus towards the lower pole does shadow measuring up to 5 mm. Bladder: Appears normal for degree of bladder distention. Other: None. IMPRESSION: Mild right-sided renal collecting system dilatation of uncertain etiology. There are some nonobstructing left-sided renal stones suggested. Overall please correlate for any known history and if needed additional noncontrast CT scan may be useful to further delineate etiology of the collecting system dilatation on the right. Electronically Signed   By: Karen Kays M.D.   On: 07/29/2023 17:30   DG Chest 2 View  Result Date: 07/28/2023 CLINICAL DATA:  Cough/fatigue x 1 week EXAM: CHEST - 2 VIEW COMPARISON:  Chest x-ray 08/24/2022, CT chest 04/02/2023 FINDINGS: The heart and mediastinal contours are unchanged. Aortic calcification. Low lung volumes. Left base atelectasis. No focal consolidation. No pulmonary edema. No pleural effusion. No pneumothorax. No acute osseous abnormality. IMPRESSION: Low lung volumes with no active cardiopulmonary disease. Electronically Signed   By: Tish Frederickson M.D.   On: 07/28/2023 13:05    Microbiology: Results for orders placed or performed during the hospital encounter of 07/28/23  Urine Culture     Status: Abnormal   Collection Time: 07/28/23  7:56 PM   Specimen: Urine, Catheterized  Result Value Ref Range Status   Specimen Description URINE, CATHETERIZED  Final    Special Requests   Final    NONE Performed at [facility], [address], [place], [place] [address]    Culture >=100,000 COLONIES/mL PSEUDOMONAS AERUGINOSA (A)  Final   Report Status 07/30/2023 FINAL  Final   Organism ID, Bacteria PSEUDOMONAS AERUGINOSA (A)  Final      Susceptibility   Pseudomonas aeruginosa - MIC*    CEFTAZIDIME 2 SENSITIVE Sensitive     CIPROFLOXACIN <=0.25 SENSITIVE Sensitive     GENTAMICIN <=1 SENSITIVE Sensitive     IMIPENEM 2 SENSITIVE Sensitive     PIP/TAZO 16 SENSITIVE Sensitive     CEFEPIME 2 SENSITIVE Sensitive     * >=100,000 COLONIES/mL PSEUDOMONAS AERUGINOSA  SARS Coronavirus 2 by RT PCR (hospital order, performed in [facility] hospital lab) *cepheid single result test* Anterior Nasal Swab     Status: None   Collection Time: 07/28/23  8:00 PM   Specimen: Anterior Nasal Swab  Result Value Ref Range Status   SARS Coronavirus 2 by RT PCR NEGATIVE NEGATIVE Final    Comment: Performed at [facility], [address], [place], [place] [address]  Group A Strep by PCR     Status: None   Collection Time: 07/28/23  8:00 PM   Specimen: Anterior Nasal Swab; Sterile Swab  Result Value Ref Range Status   Group A Strep by PCR NOT DETECTED NOT DETECTED Final    Comment: Performed at [facility], [address] [place], [place] [address]   *Note: Due to a large number of results and/or encounters for the requested time period, some results have not been displayed. A complete set of results can be found in Results Review.    Labs: CBC: Recent Labs  Lab 07/28/23 1256 07/28/23 1958 07/29/23 0015 07/31/23 0021 07/31/23 2328  WBC 14.4* 11.7* 7.8 10.5 11.0*  NEUTROABS 11.3* 9.9* 6.4  --   --   HGB 10.3* 10.3* 9.7* 9.6* 9.2*  HCT 32.0* 32.9* 30.8* 30.3* 30.1*  MCV 87 87.0 86.8 87.6 90.1  PLT 337 322 305 356 376   Basic Metabolic Panel: Recent Labs  Lab 07/28/23 1958 07/29/23 0015 07/30/23 0203 07/31/23 0021 07/31/23 2328  NA  130* 131* 138 137 136  K 4.7 4.3 3.6 3.7 3.9  CL 100 102 102 107 110  CO2 18* 18* 21* 20* 16*  GLUCOSE 164* 222* 152* 133* 197*  BUN 66* 65* 63* 53* 44*  CREATININE 3.65* 3.35* 2.73* 2.22* 1.84*  CALCIUM 9.1 8.5* 8.9 8.6* 8.3*  MG  --  2.1  --   --   --   PHOS  --   --   --  3.7  --    Liver Function Tests: Recent Labs  Lab 07/28/23 1256 07/28/23 1958 07/29/23 0015 07/31/23 0021  AST 64* 54* 42*  --   ALT 70* 68* 58*  --   ALKPHOS 170* 127* 109  --   BILITOT 0.7 0.9 0.9  --   PROT 6.4 7.1 6.6  --   ALBUMIN 2.9* 2.2* 2.0* 1.9*   CBG: Recent Labs  Lab 07/31/23 1317 07/31/23 1529 07/31/23 1659 07/31/23 2033 08/01/23 0828  GLUCAP 126* 103* 126* 273* 193*    Discharge time spent: greater than 30 minutes.  Signed: Tyrone Nine, MD [facility] 08/01/2023

## 2023-08-01 NOTE — Anesthesia Postprocedure Evaluation (Signed)
Anesthesia Post Note  Patient: Garrett [name]  Procedure(s) Performed: CYSTOSCOPY WITH RETROGRADE PYELOGRAM, URETEROSCOPY AND STENT PLACEMENT (Right)     Patient location during evaluation: PACU Anesthesia Type: General Level of consciousness: awake and alert Pain management: pain level controlled Vital Signs Assessment: post-procedure vital signs reviewed and stable Respiratory status: spontaneous breathing, nonlabored ventilation and respiratory function stable Cardiovascular status: blood pressure returned to baseline and stable Postop Assessment: no apparent nausea or vomiting Anesthetic complications: no   No notable events documented.  Last Vitals:  Vitals:   07/31/23 2032 08/01/23 0431  BP: 123/76 102/64  Pulse: 70 (!) 59  Resp:    Temp: 36.6 C 36.7 C  SpO2: 97% 96%    Last Pain:  Vitals:   08/01/23 0431  TempSrc: Oral  PainSc:    Pain Goal:                   Lowella Curb

## 2023-08-01 NOTE — Plan of Care (Signed)
  Problem: Coping: Goal: Ability to adjust to condition or change in health will improve Outcome: Progressing   Problem: Health Behavior/Discharge Planning: Goal: Ability to identify and utilize available resources and services will improve Outcome: Progressing   

## 2023-08-04 ENCOUNTER — Telehealth: Payer: Self-pay | Admitting: Family Medicine

## 2023-08-04 ENCOUNTER — Encounter: Payer: Self-pay | Admitting: *Deleted

## 2023-08-04 ENCOUNTER — Ambulatory Visit: Payer: Self-pay | Admitting: *Deleted

## 2023-08-04 ENCOUNTER — Telehealth: Payer: Self-pay | Admitting: *Deleted

## 2023-08-04 ENCOUNTER — Other Ambulatory Visit: Payer: Self-pay | Admitting: Urology

## 2023-08-04 ENCOUNTER — Ambulatory Visit: Payer: PPO

## 2023-08-04 ENCOUNTER — Other Ambulatory Visit: Payer: Self-pay | Admitting: Family Medicine

## 2023-08-04 DIAGNOSIS — K120 Recurrent oral aphthae: Secondary | ICD-10-CM

## 2023-08-04 MED ORDER — MAGIC MOUTHWASH W/LIDOCAINE: 5.0000 mL | Freq: Four times a day (QID) | ORAL | 0 refills | Status: DC | PRN

## 2023-08-04 NOTE — Telephone Encounter (Signed)
Please send to [phone] instead of [facility] (they are not able to do it)

## 2023-08-04 NOTE — Telephone Encounter (Signed)
Rx faxed

## 2023-08-04 NOTE — Telephone Encounter (Signed)
[facility] pharmacist called & stated that they no longer compound the Magic Mouthwash & it needs to be sent to another pharmacy. Please advise.

## 2023-08-04 NOTE — Transitions of Care (Post Inpatient/ED Visit) (Signed)
08/04/2023  Name: Garrett [name] MRN: [id number] DOB: March 03, 1951  Today's TOC FU Call Status: Today's TOC FU Call Status:: Successful TOC FU Call Completed  Transition Care Management Follow-up Telephone Call Date of Discharge: 08/01/23 Discharge Facility: [facility]) Type of Discharge: Inpatient Admission Primary Inpatient Discharge Diagnosis:: (R) ureterolithiasis with stent placement/ UTI/ AKI How have you been since you were released from the hospital?: Better ("I am doing fair; overall pretty good; since they put that stent in- I am peeing a lot; I am looking forward to seeing my PCP tomorrow and my urologist the day after that") Any questions or concerns?: No  Items Reviewed: Did you receive and understand the discharge instructions provided?: Yes (thoroughly reviewed with patient who verbalizes good understanding of same) Medications obtained,verified, and reconciled?: Partial Review Completed (Partial medication review completed; no concerns or discrepancies identified; confirmed patient obtained/ is taking all newly Rx'd medications as instructed; self-manages medications and denies questions/ concerns around medications today) Reason for Partial Mediation Review: Patient declined full review- not near medications Any new allergies since your discharge?: No Dietary orders reviewed?: Yes Type of Diet Ordered:: "whatever I want- try to eat healthy" Do you have support at home?: Yes People in Home: spouse Name of Support/Comfort Primary Source: Reports independent in self-care activities; supportive spouse assists as/ if needed/ indicated  Medications Reviewed Today: Medications Reviewed Today     Reviewed by Michaela Corner, RN (Registered Nurse) on 08/04/23 at 1105  Med List Status: <None>   Medication Order Taking? Sig Documenting Provider Last Dose Status Informant  allopurinol (ZYLOPRIM) 100 MG tablet [id number]  Take 1 tablet (100 mg total) by mouth daily. Zola Button, Grayling Congress, DO  Active   atorvastatin (LIPITOR) 80 MG tablet [id number]  Take 1 tablet (80 mg total) by mouth daily.   Active   ciprofloxacin (CIPRO) 500 MG tablet [id number] Yes Take 1 tablet (500 mg total) by mouth 2 (two) times daily for 7 days. Tyrone Nine, MD Taking Active   fenofibrate 160 MG tablet [id number]  TAKE 1 TABLET BY MOUTH DAILY Donato Schultz, DO  Active   Ferrous Sulfate (IRON PO) [id number]  Take 1 tablet by mouth daily. [provider]  Active Self  fluticasone (FLONASE) 50 MCG/ACT nasal spray [id number]  Place 2 sprays into both nostrils daily. Donato Schultz, DO  Active Self  Fluticasone-Umeclidin-Vilant (TRELEGY ELLIPTA) 100-62.5-25 MCG/ACT AEPB [id number]  Inhale 1 puff into the lungs daily. Zola Button, Grayling Congress, DO  Active   furosemide (LASIX) 40 MG tablet [id number] Yes Take 1 tablet (40 mg total) by mouth 2 (two) times daily.  Taking Active   gabapentin (NEURONTIN) 100 MG capsule [id number]  Take 2 capsules (200 mg total) by mouth at bedtime. Zola Button, Myrene Buddy R, DO  Active   glucose blood [facility] ULTRA) test strip [id number]  USE AS DIRECTED 3 TIMES  DAILY Zola Button, Grayling Congress, DO  Active   levocetirizine (XYZAL) 5 MG tablet [id number]  Take 1 tablet (5 mg total) by mouth every evening. Zola Button, Grayling Congress, DO  Active   memantine (NAMENDA) 10 MG tablet [id number]  Take 2 tablets (20 mg total) by mouth daily. Zola Button, Grayling Congress, DO  Active   metFORMIN (GLUCOPHAGE-XR) 500 MG 24 hr tablet [id number]  Take 1 tablet (500 mg) by mouth daily with breakfast. Zola Button, Grayling Congress, DO  Active   Metoprolol Tartrate 75 MG TABS [id number]  Take 1 tablet (  75 mg total) by mouth 2 (two) times daily.   Active   Multiple Vitamins-Minerals (ONE-A-DAY WEIGHT SMART ADVANCE PO) [id number]  Take 1 tablet by mouth daily. Centrum Silver [provider]  Active Self  nitroGLYCERIN (NITROSTAT) 0.4 MG SL tablet [id number]  Place 1 tablet (0.4 mg total) under the  tongue every 5 (five) minutes as needed for chest pain.  Patient not taking: Reported on 07/28/2023   Copland, Gwenlyn Found, MD  Active   omeprazole (PRILOSEC) 40 MG capsule [id number]  Take 1 capsule (40 mg total) by mouth 2 (two) times daily shortly before a meal (breakfast and dinner). Zola Button, Myrene Buddy R, DO  Active   potassium chloride SA (KLOR-CON M) 20 MEQ tablet [id number] Yes Take 2 tablets (40 mEq total) by mouth daily. Zola Button, Grayling Congress, DO Taking Active   PROAIR HFA 108 [phone] Base) MCG/ACT inhaler [id number]  Inhale 1 puff into the lungs every 6 (six) hours as needed for wheezing or shortness of breath. Donato Schultz, DO  Active Self  rivaroxaban (XARELTO) 10 MG TABS tablet [id number]  Take 1 tablet (10 mg total) by mouth daily. Zola Button, Yvonne R, DO  Active   Semaglutide (OZEMPIC, 0.25 OR 0.5 MG/DOSE, [place]) [id number]  Inject 0.5 mg into the skin once a week. Sunday [provider]  Active   tamsulosin (FLOMAX) 0.4 MG CAPS capsule [id number] Yes Take 1 capsule (0.4 mg total) by mouth daily. Tyrone Nine, MD Taking Active   topiramate (TOPAMAX) 50 MG tablet [id number]  Take 1 tablet (50 mg total) by mouth 2 (two) times daily. Van Clines, MD  Active   vitamin B-12 (CYANOCOBALAMIN) 1000 MCG tablet [id number]  Take 1,000 mcg by mouth daily. [provider]  Active Self           Home Care and Equipment/Supplies: Were Home Health Services Ordered?: No Any new equipment or medical supplies ordered?: No  Functional Questionnaire: Do you need assistance with bathing/showering or dressing?: No Do you need assistance with meal preparation?: No Do you need assistance with eating?: No Do you have difficulty maintaining continence: No Do you need assistance with getting out of bed/getting out of a chair/moving?: No Do you have difficulty managing or taking your medications?: No  Follow up appointments reviewed: PCP Follow-up appointment confirmed?: Yes Date of  PCP follow-up appointment?: 08/05/23 Follow-up Provider: PCP Specialist Hospital Follow-up appointment confirmed?: Yes Date of Specialist follow-up appointment?: 08/06/23 Follow-Up Specialty Provider:: Urology- Dr. Annabell Howells Do you need transportation to your follow-up appointment?: No Do you understand care options if your condition(s) worsen?: Yes-patient verbalized understanding  SDOH Interventions Today    Flowsheet Row Most Recent Value  SDOH Interventions   Food Insecurity Interventions Intervention Not Indicated  Transportation Interventions Intervention Not Indicated  [normally drives self,  wife assisting with transportation after recent hospitalization]      TOC Interventions Today    Flowsheet Row Most Recent Value  TOC Interventions   TOC Interventions Discussed/Reviewed TOC Interventions Discussed  [Patient declines need for ongoing/ further care coordination outreach,  no care coordination needs identified at time of TOC call today]      Interventions Today    Flowsheet Row Most Recent Value  Chronic Disease   Chronic disease during today's visit Other  [AKI/ UTI/ kidney- ureter stone with stent placement]  General Interventions   General Interventions Discussed/Reviewed General Interventions Discussed, Durable Medical Equipment (DME), Doctor Visits  Doctor Visits Discussed/Reviewed PCP, Specialist, Doctor Visits  Discussed  Durable Medical Equipment (DME) Val Riles currently requiring/ using assistive devices - walker]  PCP/Specialist Visits Compliance with follow-up visit  Nutrition Interventions   Nutrition Discussed/Reviewed Nutrition Discussed  Pharmacy Interventions   Pharmacy Dicussed/Reviewed Pharmacy Topics Discussed  Safety Interventions   Safety Discussed/Reviewed Safety Discussed      Caryl Pina, RN, BSN, CCRN Alumnus RN CM Care Coordination/ Transition of Care- [facility] Care Management [phone]: direct office

## 2023-08-04 NOTE — Chronic Care Management (AMB) (Signed)
   08/04/2023  Garrett [name] March 18, 1951 [id number]   Pt is not currently participating in Chronic Care Management services (CCM), status changed to previously enrolled.   Irving Shows [facility], BSN [place]/ Ambulatory Care Management [phone]

## 2023-08-04 NOTE — Telephone Encounter (Signed)
See previous note

## 2023-08-04 NOTE — Telephone Encounter (Signed)
Pt's wife requesting magic mouthwash to be called in because patient's mouth is still raw and he's losing weight like crazy so she's hoping the mouthwash will numb his mouth some so he is able to eat. Pt was in the hospital and has been discharged and will follow up Tuesday. Pt uses [facility] in [place].

## 2023-08-04 NOTE — Telephone Encounter (Signed)
Pt requested this to be sent as urgent

## 2023-08-05 ENCOUNTER — Other Ambulatory Visit (HOSPITAL_COMMUNITY): Payer: Self-pay

## 2023-08-05 ENCOUNTER — Encounter: Payer: Self-pay | Admitting: Family Medicine

## 2023-08-05 ENCOUNTER — Ambulatory Visit (INDEPENDENT_AMBULATORY_CARE_PROVIDER_SITE_OTHER): Payer: PPO | Admitting: Family Medicine

## 2023-08-05 VITALS — BP 100/70 | HR 79 | Temp 97.9°F | Resp 20 | Ht 69.0 in | Wt 179.0 lb

## 2023-08-05 DIAGNOSIS — N182 Chronic kidney disease, stage 2 (mild): Secondary | ICD-10-CM

## 2023-08-05 DIAGNOSIS — D500 Iron deficiency anemia secondary to blood loss (chronic): Secondary | ICD-10-CM

## 2023-08-05 DIAGNOSIS — E1122 Type 2 diabetes mellitus with diabetic chronic kidney disease: Secondary | ICD-10-CM

## 2023-08-05 DIAGNOSIS — R197 Diarrhea, unspecified: Secondary | ICD-10-CM | POA: Diagnosis not present

## 2023-08-05 DIAGNOSIS — E1165 Type 2 diabetes mellitus with hyperglycemia: Secondary | ICD-10-CM

## 2023-08-05 DIAGNOSIS — R600 Localized edema: Secondary | ICD-10-CM | POA: Insufficient documentation

## 2023-08-05 DIAGNOSIS — T560X1D Toxic effect of lead and its compounds, accidental (unintentional), subsequent encounter: Secondary | ICD-10-CM

## 2023-08-05 DIAGNOSIS — E785 Hyperlipidemia, unspecified: Secondary | ICD-10-CM

## 2023-08-05 DIAGNOSIS — E1169 Type 2 diabetes mellitus with other specified complication: Secondary | ICD-10-CM | POA: Diagnosis not present

## 2023-08-05 DIAGNOSIS — M1A10X0 Lead-induced chronic gout, unspecified site, without tophus (tophi): Secondary | ICD-10-CM

## 2023-08-05 DIAGNOSIS — I10 Essential (primary) hypertension: Secondary | ICD-10-CM

## 2023-08-05 DIAGNOSIS — I2699 Other pulmonary embolism without acute cor pulmonale: Secondary | ICD-10-CM

## 2023-08-05 DIAGNOSIS — K219 Gastro-esophageal reflux disease without esophagitis: Secondary | ICD-10-CM

## 2023-08-05 DIAGNOSIS — J4520 Mild intermittent asthma, uncomplicated: Secondary | ICD-10-CM | POA: Insufficient documentation

## 2023-08-05 DIAGNOSIS — I129 Hypertensive chronic kidney disease with stage 1 through stage 4 chronic kidney disease, or unspecified chronic kidney disease: Secondary | ICD-10-CM

## 2023-08-05 MED ORDER — RIVAROXABAN 10 MG PO TABS
10.0000 mg | ORAL_TABLET | Freq: Every day | ORAL | 1 refills | Status: DC
Start: 2023-08-05 — End: 2024-02-03
  Filled 2023-08-05: qty 90, 90d supply, fill #0
  Filled 2023-10-26: qty 60, 60d supply, fill #1
  Filled 2024-01-07: qty 30, 30d supply, fill #2

## 2023-08-05 MED ORDER — ATORVASTATIN CALCIUM 80 MG PO TABS
80.0000 mg | ORAL_TABLET | Freq: Every day | ORAL | 3 refills | Status: DC
Start: 2023-08-05 — End: 2024-04-01
  Filled 2023-08-05: qty 90, 90d supply, fill #0

## 2023-08-05 MED ORDER — ALBUTEROL SULFATE HFA 108 (90 BASE) MCG/ACT IN AERS
1.0000 | INHALATION_SPRAY | Freq: Four times a day (QID) | RESPIRATORY_TRACT | 3 refills | Status: DC | PRN
Start: 2023-08-05 — End: 2024-09-24
  Filled 2023-08-05: qty 13.4, 100d supply, fill #0

## 2023-08-05 MED ORDER — TRELEGY ELLIPTA 100-62.5-25 MCG/ACT IN AEPB
1.0000 | INHALATION_SPRAY | Freq: Every day | RESPIRATORY_TRACT | 11 refills | Status: DC
Start: 2023-08-05 — End: 2023-12-18
  Filled 2023-08-05: qty 60, 30d supply, fill #0

## 2023-08-05 MED ORDER — POTASSIUM CHLORIDE CRYS ER 20 MEQ PO TBCR
40.0000 meq | EXTENDED_RELEASE_TABLET | Freq: Every day | ORAL | 0 refills | Status: DC
Start: 2023-08-05 — End: 2023-12-03
  Filled 2023-08-05 – 2023-08-29 (×4): qty 180, 90d supply, fill #0

## 2023-08-05 MED ORDER — METOPROLOL TARTRATE 75 MG PO TABS
75.0000 mg | ORAL_TABLET | Freq: Two times a day (BID) | ORAL | 3 refills | Status: DC
Start: 2023-08-05 — End: 2024-07-15
  Filled 2023-08-05: qty 180, 90d supply, fill #0
  Filled 2024-03-31: qty 180, 90d supply, fill #1
  Filled 2024-06-30: qty 180, 90d supply, fill #2

## 2023-08-05 MED ORDER — FUROSEMIDE 40 MG PO TABS
40.0000 mg | ORAL_TABLET | Freq: Two times a day (BID) | ORAL | 3 refills | Status: DC
Start: 2023-08-05 — End: 2024-04-01
  Filled 2023-08-05: qty 180, 90d supply, fill #0

## 2023-08-05 MED ORDER — ALLOPURINOL 100 MG PO TABS
100.0000 mg | ORAL_TABLET | Freq: Every day | ORAL | 0 refills | Status: DC
Start: 2023-08-05 — End: 2023-11-26
  Filled 2023-08-05: qty 90, 90d supply, fill #0

## 2023-08-05 MED ORDER — MEMANTINE HCL 10 MG PO TABS
20.0000 mg | ORAL_TABLET | Freq: Every day | ORAL | 0 refills | Status: DC
  Filled 2023-08-05 – 2023-08-29 (×4): qty 180, 90d supply, fill #0

## 2023-08-05 MED ORDER — METFORMIN HCL ER 500 MG PO TB24
500.0000 mg | ORAL_TABLET | Freq: Every day | ORAL | 1 refills | Status: DC
Start: 2023-08-05 — End: 2024-05-24
  Filled 2023-08-05 – 2023-12-29 (×5): qty 93, 93d supply, fill #0
  Filled 2024-03-31: qty 93, 93d supply, fill #1

## 2023-08-05 MED ORDER — OMEPRAZOLE 40 MG PO CPDR
40.0000 mg | DELAYED_RELEASE_CAPSULE | Freq: Two times a day (BID) | ORAL | 1 refills | Status: DC
Start: 2023-08-05 — End: 2024-03-31
  Filled 2023-08-05 – 2023-10-17 (×3): qty 180, 90d supply, fill #0
  Filled 2024-01-07: qty 180, 90d supply, fill #1

## 2023-08-05 NOTE — Assessment & Plan Note (Signed)
F/u pulmonary  

## 2023-08-05 NOTE — Assessment & Plan Note (Signed)
Cont xaralto 

## 2023-08-05 NOTE — Assessment & Plan Note (Signed)
Well controlled, no changes to meds. Encouraged heart healthy diet such as the DASH diet and exercise as tolerated.  °
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
## 2023-08-05 NOTE — Assessment & Plan Note (Signed)
Encourage heart healthy diet such as MIND or DASH diet, increase exercise, avoid trans fats, simple carbohydrates and processed foods, consider a krill or fish or flaxseed oil cap daily.  °

## 2023-08-05 NOTE — Assessment & Plan Note (Signed)
hgba1c good.  minimize simple carbs. Increase exercise as tolerated. Continue current meds Lab Results  Component Value Date   HGBA1C 7.0 (H) 07/29/2023

## 2023-08-05 NOTE — Telephone Encounter (Signed)
Rx faxed to [facility]

## 2023-08-05 NOTE — Patient Instructions (Signed)
DUE TO COVID-19 ONLY TWO VISITORS  (aged 73 and older)  ARE ALLOWED TO COME WITH YOU AND STAY IN THE WAITING ROOM ONLY DURING PRE OP AND PROCEDURE.   **NO VISITORS ARE ALLOWED IN THE SHORT STAY AREA OR RECOVERY ROOM!!**  IF YOU WILL BE ADMITTED INTO THE HOSPITAL YOU ARE ALLOWED ONLY FOUR SUPPORT PEOPLE DURING VISITATION HOURS ONLY (7 AM -8PM)   The support person(s) must pass our screening, gel in and out, and wear a mask at all times, including in the patient's room. Patients must also wear a mask when staff or their support person are in the room. Visitors GUEST BADGE MUST BE WORN VISIBLY  One adult visitor may remain with you overnight and MUST be in the room by 8 P.M.     Your procedure is scheduled on: 08/15/23   Report to [facility] Main Entrance    Report to admitting at : 8:45 AM   Call this number if you have problems the morning of surgery [phone]   Do not eat food :After Midnight.   After Midnight you may have the following liquids until : 8:00 AM DAY OF SURGERY  Water Black Coffee (sugar ok, NO MILK/CREAM OR CREAMERS)  Tea (sugar ok, NO MILK/CREAM OR CREAMERS) regular and decaf                             Plain Jell-O (NO RED)                                           Fruit ices (not with fruit pulp, NO RED)                                     Popsicles (NO RED)                                                                  Juice: apple, WHITE grape, WHITE cranberry Sports drinks like Gatorade (NO RED)  FOLLOW ANY ADDITIONAL PRE OP INSTRUCTIONS YOU RECEIVED FROM YOUR SURGEON'S OFFICE!!!  Oral Hygiene is also important to reduce your risk of infection.                                    Remember - BRUSH YOUR TEETH THE MORNING OF SURGERY WITH YOUR REGULAR TOOTHPASTE  DENTURES WILL BE REMOVED PRIOR TO SURGERY PLEASE DO NOT APPLY "Poly grip" OR ADHESIVES!!!   Do NOT smoke after Midnight   Take these medicines the morning of surgery with A SIP OF WATER:  topiramate(Topamax),metoprolol,allopurinol,tamsulosin,memantine,omeprazole.Use inhalers as usual. How to Manage Your Diabetes Before and After Surgery  Why is it important to control my blood sugar before and after surgery? Improving blood sugar levels before and after surgery helps healing and can limit problems. A way of improving blood sugar control is eating a healthy diet by:  Eating less sugar and carbohydrates  Increasing activity/exercise  Talking with your doctor about  reaching your blood sugar goals High blood sugars (greater than 180 mg/dL) can raise your risk of infections and slow your recovery, so you will need to focus on controlling your diabetes during the weeks before surgery. Make sure that the doctor who takes care of your diabetes knows about your planned surgery including the date and location.  How do I manage my blood sugar before surgery? Check your blood sugar at least 4 times a day, starting 2 days before surgery, to make sure that the level is not too high or low. Check your blood sugar the morning of your surgery when you wake up and every 2 hours until you get to the Short Stay unit. If your blood sugar is less than 70 mg/dL, you will need to treat for low blood sugar: Do not take insulin. Treat a low blood sugar (less than 70 mg/dL) with  cup of clear juice (cranberry or apple), 4 glucose tablets, OR glucose gel. Recheck blood sugar in 15 minutes after treatment (to make sure it is greater than 70 mg/dL). If your blood sugar is not greater than 70 mg/dL on recheck, call [phone] for further instructions. Report your blood sugar to the short stay nurse when you get to Short Stay.  If you are admitted to the hospital after surgery: Your blood sugar will be checked by the staff and you will probably be given insulin after surgery (instead of oral diabetes medicines) to make sure you have good blood sugar levels. The goal for blood sugar control after surgery  is 80-180 mg/dL.   WHAT DO I DO ABOUT MY DIABETES MEDICATION?     THE MORNING OF SURGERY,DO NOT TAKE ANY ORAL DIABETIC MEDICATIONS DAY OF YOUR SURGERY  DO NOT TAKE THE FOLLOWING 7 DAYS PRIOR TO SURGERY: Ozempic, Wegovy, Rybelsus (Semaglutide), Byetta (exenatide), Bydureon (exenatide ER), Victoza, Saxenda (liraglutide), or Trulicity (dulaglutide) Mounjaro (Tirzepatide) Adlyxin (Lixisenatide), Polyethylene Glycol Loxenatide. Last dose: 08/07/23  Bring CPAP mask and tubing day of surgery.                              You may not have any metal on your body including hair pins, jewelry, and body piercing             Do not wear lotions, powders, perfumes/cologne, or deodorant              Men may shave face and neck.   Do not bring valuables to the hospital. [place] IS NOT             RESPONSIBLE   FOR VALUABLES.   Contacts, glasses, or bridgework may not be worn into surgery.   Bring small overnight bag day of surgery.   DO NOT BRING YOUR HOME MEDICATIONS TO THE HOSPITAL. PHARMACY WILL DISPENSE MEDICATIONS LISTED ON YOUR MEDICATION LIST TO YOU DURING YOUR ADMISSION IN THE HOSPITAL!    Patients discharged on the day of surgery will not be allowed to drive home.  Someone NEEDS to stay with you for the first 24 hours after anesthesia.   Special Instructions: Bring a copy of your healthcare power of attorney and living will documents         the day of surgery if you haven't scanned them before.              Please read over the following fact sheets you were given: IF YOU HAVE QUESTIONS ABOUT YOUR  PRE-OP INSTRUCTIONS PLEASE CALL [phone]    [facility] - Preparing for Surgery Before surgery, you can play an important role.  Because skin is not sterile, your skin needs to be as free of germs as possible.  You can reduce the number of germs on your skin by washing with CHG (chlorahexidine gluconate) soap before surgery.  CHG is an antiseptic cleaner which kills germs and bonds with  the skin to continue killing germs even after washing. Please DO NOT use if you have an allergy to CHG or antibacterial soaps.  If your skin becomes reddened/irritated stop using the CHG and inform your nurse when you arrive at Short Stay. Do not shave (including legs and underarms) for at least 48 hours prior to the first CHG shower.  You may shave your face/neck. Please follow these instructions carefully:  1.  Shower with CHG Soap the night before surgery and the  morning of Surgery.  2.  If you choose to wash your hair, wash your hair first as usual with your  normal  shampoo.  3.  After you shampoo, rinse your hair and body thoroughly to remove the  shampoo.                           4.  Use CHG as you would any other liquid soap.  You can apply chg directly  to the skin and wash                       Gently with a scrungie or clean washcloth.  5.  Apply the CHG Soap to your body ONLY FROM THE NECK DOWN.   Do not use on face/ open                           Wound or open sores. Avoid contact with eyes, ears mouth and genitals (private parts).                       Wash face,  Genitals (private parts) with your normal soap.             6.  Wash thoroughly, paying special attention to the area where your surgery  will be performed.  7.  Thoroughly rinse your body with warm water from the neck down.  8.  DO NOT shower/wash with your normal soap after using and rinsing off  the CHG Soap.                9.  Pat yourself dry with a clean towel.            10.  Wear clean pajamas.            11.  Place clean sheets on your bed the night of your first shower and do not  sleep with pets. Day of Surgery : Do not apply any lotions/deodorants the morning of surgery.  Please wear clean clothes to the hospital/surgery center.  FAILURE TO FOLLOW THESE INSTRUCTIONS MAY RESULT IN THE CANCELLATION OF YOUR SURGERY PATIENT SIGNATURE_________________________________  NURSE  SIGNATURE__________________________________  ________________________________________________________________________

## 2023-08-06 ENCOUNTER — Other Ambulatory Visit: Payer: Self-pay

## 2023-08-06 ENCOUNTER — Other Ambulatory Visit (HOSPITAL_COMMUNITY): Payer: Self-pay

## 2023-08-07 ENCOUNTER — Encounter (HOSPITAL_COMMUNITY): Payer: Self-pay

## 2023-08-07 ENCOUNTER — Other Ambulatory Visit (INDEPENDENT_AMBULATORY_CARE_PROVIDER_SITE_OTHER): Payer: PPO

## 2023-08-07 ENCOUNTER — Encounter (HOSPITAL_COMMUNITY)
Admission: RE | Admit: 2023-08-07 | Discharge: 2023-08-07 | Disposition: A | Discharge status: Home / Self Care | Payer: PPO | Source: Ambulatory Visit | Attending: Urology | Admitting: Urology

## 2023-08-07 ENCOUNTER — Other Ambulatory Visit: Payer: Self-pay

## 2023-08-07 VITALS — BP 114/67 | HR 83 | Temp 98.0°F | Ht 69.0 in | Wt 181.0 lb

## 2023-08-07 DIAGNOSIS — Z01818 Encounter for other preprocedural examination: Secondary | ICD-10-CM | POA: Insufficient documentation

## 2023-08-07 DIAGNOSIS — E1165 Type 2 diabetes mellitus with hyperglycemia: Secondary | ICD-10-CM

## 2023-08-07 DIAGNOSIS — R197 Diarrhea, unspecified: Secondary | ICD-10-CM | POA: Diagnosis not present

## 2023-08-07 DIAGNOSIS — E1122 Type 2 diabetes mellitus with diabetic chronic kidney disease: Secondary | ICD-10-CM

## 2023-08-07 HISTORY — DX: Anemia, unspecified: D64.9

## 2023-08-07 HISTORY — DX: Personal history of urinary calculi: Z87.442

## 2023-08-07 LAB — GLUCOSE, CAPILLARY: Glucose-Capillary: 112 mg/dL — ABNORMAL HIGH (ref 70–99)

## 2023-08-07 NOTE — Progress Notes (Addendum)
For Short Stay: COVID SWAB appointment date:  Bowel Prep reminder:   For Anesthesia: PCP - Donato Schultz, DO Cardiologist - [facility]. : Records: Chart.  Chest x-ray -  EKG - 07/28/23: CEW Stress Test - 10/10/10 ECHO - 08/16/21 Cardiac Cath - 02/07/23 Pacemaker/ICD device last checked: Pacemaker orders received: Device Rep notified:  Spinal Cord Stimulator: N/A  Sleep Study - Yes CPAP - Yes  Fasting Blood Sugar - 160's - 170's Checks Blood Sugar __1___ times a day Date and result of last Hgb A1c-7.0: 07/29/23  Last dose of GLP1 agonist- semaglutide. Last dose: 08/03/23 GLP1 instructions: To keep on hold.  Last dose of SGLT-2 inhibitors- N/A SGLT-2 instructions:   Blood Thinner Instructions: Pt's wife will check with Dr. Belva Crome office because as per pt. Pharmacy told him to don't stop taking the medication. Aspirin Instructions: Last Dose:  Activity level: Can go up a flight of stairs and activities of daily living without stopping and without chest pain and/or shortness of breath   Able to exercise without chest pain and/or shortness of breath   Unable to go up a flight of stairs without shortness of breath    Anesthesia review: Hx: PE,DIA,Heart murmur,OSA(CPAP),pt. Has been feeling sick with diarrhea,loss of appetite and weight loss.Waiting for stools cultures results.  Patient denies shortness of breath, fever, cough and chest pain at PAT appointment   Patient verbalized understanding of instructions that were given to them at the PAT appointment. Patient was also instructed that they will need to review over the PAT instructions again at home before surgery.

## 2023-08-07 NOTE — Addendum Note (Signed)
Addended by: Mervin Kung A on: 08/07/2023 01:11 PM   Modules accepted: Orders

## 2023-08-08 ENCOUNTER — Other Ambulatory Visit (HOSPITAL_COMMUNITY): Payer: Self-pay

## 2023-08-08 MED ORDER — DULERA 100-5 MCG/ACT IN AERO
1.0000 | INHALATION_SPRAY | Freq: Two times a day (BID) | RESPIRATORY_TRACT | 11 refills | Status: DC
Start: 2023-08-08 — End: 2024-09-24
  Filled 2023-08-08 – 2023-08-29 (×3): qty 13, 30d supply, fill #0
  Filled 2023-10-26: qty 13, 30d supply, fill #1
  Filled 2023-12-29: qty 13, 30d supply, fill #2
  Filled 2024-03-08: qty 13, 30d supply, fill #3
  Filled 2024-05-19: qty 13, 30d supply, fill #4
  Filled 2024-06-30: qty 13, 30d supply, fill #5

## 2023-08-08 NOTE — Progress Notes (Signed)
Received a phone call from pt's wife.pt. needs to continue Xarelto as per MD.

## 2023-08-11 ENCOUNTER — Ambulatory Visit: Payer: PPO

## 2023-08-13 ENCOUNTER — Encounter: Payer: Self-pay | Admitting: Pharmacist

## 2023-08-13 ENCOUNTER — Other Ambulatory Visit (HOSPITAL_COMMUNITY): Payer: Self-pay

## 2023-08-13 ENCOUNTER — Other Ambulatory Visit: Payer: Self-pay

## 2023-08-13 LAB — CDIFF NAA+O+P+STOOL CULTURE
E coli, Shiga toxin Assay: NEGATIVE
Toxigenic C. Difficile by PCR: NEGATIVE
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
## 2023-08-13 NOTE — Anesthesia Preprocedure Evaluation (Addendum)
Anesthesia Evaluation  Patient identified by MRN, date of birth, ID band Patient awake    Reviewed: Allergy & Precautions, H&P , NPO status , Patient's Chart, lab work & pertinent test results  Airway Mallampati: II   Neck ROM: full    Dental   Pulmonary shortness of breath, asthma , sleep apnea , former smoker   breath sounds clear to auscultation       Cardiovascular hypertension, +CHF   Rhythm:regular Rate:Normal     Neuro/Psych  Headaches PSYCHIATRIC DISORDERS     Dementia    GI/Hepatic ,GERD  ,,  Endo/Other  diabetes, Type 2    Renal/GU Renal InsufficiencyRenal diseasestones     Musculoskeletal  (+) Arthritis ,    Abdominal   Peds  Hematology   Anesthesia Other Findings   Reproductive/Obstetrics                             Anesthesia Physical Anesthesia Plan  ASA: 3  Anesthesia Plan: General   Post-op Pain Management:    Induction: Intravenous  PONV Risk Score and Plan: 2 and Ondansetron, Dexamethasone and Treatment may vary due to age or medical condition  Airway Management Planned: LMA  Additional Equipment:   Intra-op Plan:   Post-operative Plan: Extubation in OR  Informed Consent: I have reviewed the patients History and Physical, chart, labs and discussed the procedure including the risks, benefits and alternatives for the proposed anesthesia with the patient or authorized representative who has indicated his/her understanding and acceptance.     Dental advisory given  Plan Discussed with: CRNA, Anesthesiologist and Surgeon  Anesthesia Plan Comments: (See PAT note 08/07/2023)       Anesthesia Quick Evaluation

## 2023-08-14 ENCOUNTER — Other Ambulatory Visit: Payer: Self-pay

## 2023-08-14 ENCOUNTER — Encounter: Payer: PPO | Admitting: Physical Therapy

## 2023-08-15 ENCOUNTER — Ambulatory Visit (HOSPITAL_COMMUNITY)
Admission: RE | Admit: 2023-08-15 | Discharge: 2023-08-15 | Disposition: A | Discharge status: Home / Self Care | Payer: PPO | Attending: Urology | Admitting: Urology

## 2023-08-15 ENCOUNTER — Ambulatory Visit (HOSPITAL_COMMUNITY): Payer: PPO

## 2023-08-15 ENCOUNTER — Encounter (HOSPITAL_COMMUNITY)
Admission: RE | Disposition: A | Discharge status: Home / Self Care | Payer: Self-pay | Source: Home / Self Care | Attending: Urology

## 2023-08-15 ENCOUNTER — Encounter (HOSPITAL_COMMUNITY): Payer: Self-pay | Admitting: Urology

## 2023-08-15 ENCOUNTER — Ambulatory Visit (HOSPITAL_COMMUNITY): Payer: PPO | Admitting: Physician Assistant

## 2023-08-15 ENCOUNTER — Ambulatory Visit (HOSPITAL_BASED_OUTPATIENT_CLINIC_OR_DEPARTMENT_OTHER): Payer: PPO | Admitting: Anesthesiology

## 2023-08-15 DIAGNOSIS — I110 Hypertensive heart disease with heart failure: Secondary | ICD-10-CM | POA: Diagnosis not present

## 2023-08-15 DIAGNOSIS — N390 Urinary tract infection, site not specified: Secondary | ICD-10-CM | POA: Insufficient documentation

## 2023-08-15 DIAGNOSIS — Z87891 Personal history of nicotine dependence: Secondary | ICD-10-CM | POA: Insufficient documentation

## 2023-08-15 DIAGNOSIS — N182 Chronic kidney disease, stage 2 (mild): Secondary | ICD-10-CM

## 2023-08-15 DIAGNOSIS — I130 Hypertensive heart and chronic kidney disease with heart failure and stage 1 through stage 4 chronic kidney disease, or unspecified chronic kidney disease: Secondary | ICD-10-CM

## 2023-08-15 DIAGNOSIS — I509 Heart failure, unspecified: Secondary | ICD-10-CM | POA: Diagnosis not present

## 2023-08-15 DIAGNOSIS — N202 Calculus of kidney with calculus of ureter: Secondary | ICD-10-CM | POA: Insufficient documentation

## 2023-08-15 DIAGNOSIS — E1122 Type 2 diabetes mellitus with diabetic chronic kidney disease: Secondary | ICD-10-CM | POA: Diagnosis not present

## 2023-08-15 DIAGNOSIS — I5032 Chronic diastolic (congestive) heart failure: Secondary | ICD-10-CM | POA: Diagnosis not present

## 2023-08-15 DIAGNOSIS — E119 Type 2 diabetes mellitus without complications: Secondary | ICD-10-CM | POA: Insufficient documentation

## 2023-08-15 DIAGNOSIS — N201 Calculus of ureter: Secondary | ICD-10-CM | POA: Diagnosis not present

## 2023-08-15 DIAGNOSIS — E1165 Type 2 diabetes mellitus with hyperglycemia: Secondary | ICD-10-CM

## 2023-08-15 HISTORY — PX: CYSTOSCOPY/URETEROSCOPY/HOLMIUM LASER/STENT PLACEMENT: SHX6546

## 2023-08-15 LAB — CBC
HCT: 33.2 % — ABNORMAL LOW (ref 39.0–52.0)
Hemoglobin: 10.5 g/dL — ABNORMAL LOW (ref 13.0–17.0)
MCH: 28.1 pg (ref 26.0–34.0)
MCHC: 31.6 g/dL (ref 30.0–36.0)
MCV: 88.8 fL (ref 80.0–100.0)
Platelets: 400 10*3/uL (ref 150–400)
RBC: 3.74 MIL/uL — ABNORMAL LOW (ref 4.22–5.81)
RDW: 18.1 % — ABNORMAL HIGH (ref 11.5–15.5)
WBC: 13.7 10*3/uL — ABNORMAL HIGH (ref 4.0–10.5)
nRBC: 0 % (ref 0.0–0.2)

## 2023-08-15 LAB — GLUCOSE, CAPILLARY
Glucose-Capillary: 154 mg/dL — ABNORMAL HIGH (ref 70–99)
Glucose-Capillary: 116 mg/dL — ABNORMAL HIGH (ref 70–99)

## 2023-08-15 SURGERY — CYSTOSCOPY/URETEROSCOPY/HOLMIUM LASER/STENT PLACEMENT
Anesthesia: General | Laterality: Right

## 2023-08-15 MED ORDER — CIPROFLOXACIN HCL 250 MG PO TABS: 250.0000 mg | ORAL_TABLET | Freq: Two times a day (BID) | ORAL | 0 refills | Status: AC

## 2023-08-15 MED ORDER — ONDANSETRON HCL 4 MG/2ML IJ SOLN
INTRAMUSCULAR | Status: AC
  Filled 2023-08-15: qty 2

## 2023-08-15 MED ORDER — CHLORHEXIDINE GLUCONATE 0.12 % MT SOLN
15.0000 mL | Freq: Once | OROMUCOSAL | Status: AC
  Administered 2023-08-15: 15 mL via OROMUCOSAL

## 2023-08-15 MED ORDER — PROPOFOL 10 MG/ML IV BOLUS
INTRAVENOUS | Status: AC
  Filled 2023-08-15: qty 20

## 2023-08-15 MED ORDER — LACTATED RINGERS IV SOLN: INTRAVENOUS | Status: DC

## 2023-08-15 MED ORDER — PROPOFOL 10 MG/ML IV BOLUS
INTRAVENOUS | Status: DC | PRN
  Administered 2023-08-15: 160 mg via INTRAVENOUS

## 2023-08-15 MED ORDER — FLUCONAZOLE IN SODIUM CHLORIDE 200-0.9 MG/100ML-% IV SOLN
200.0000 mg | Freq: Once | INTRAVENOUS | Status: AC
  Administered 2023-08-15: 200 mg via INTRAVENOUS
  Filled 2023-08-15: qty 100

## 2023-08-15 MED ORDER — DEXAMETHASONE SODIUM PHOSPHATE 10 MG/ML IJ SOLN
INTRAMUSCULAR | Status: AC
  Filled 2023-08-15: qty 1

## 2023-08-15 MED ORDER — FENTANYL CITRATE PF 50 MCG/ML IJ SOSY: 25.0000 ug | PREFILLED_SYRINGE | INTRAMUSCULAR | Status: DC | PRN

## 2023-08-15 MED ORDER — EPHEDRINE SULFATE (PRESSORS) 50 MG/ML IJ SOLN
INTRAMUSCULAR | Status: DC | PRN
Start: 2023-08-15 — End: 2023-08-15
  Administered 2023-08-15 (×3): 5 mg via INTRAVENOUS

## 2023-08-15 MED ORDER — FENTANYL CITRATE (PF) 100 MCG/2ML IJ SOLN
INTRAMUSCULAR | Status: DC | PRN
  Administered 2023-08-15: 25 ug via INTRAVENOUS
  Administered 2023-08-15: 50 ug via INTRAVENOUS
  Administered 2023-08-15: 25 ug via INTRAVENOUS

## 2023-08-15 MED ORDER — ONDANSETRON HCL 4 MG/2ML IJ SOLN: 4.0000 mg | Freq: Four times a day (QID) | INTRAMUSCULAR | Status: DC | PRN

## 2023-08-15 MED ORDER — ORAL CARE MOUTH RINSE: 15.0000 mL | Freq: Once | OROMUCOSAL | Status: AC

## 2023-08-15 MED ORDER — FENTANYL CITRATE (PF) 100 MCG/2ML IJ SOLN
INTRAMUSCULAR | Status: AC
  Filled 2023-08-15: qty 2

## 2023-08-15 MED ORDER — HYDROCODONE-ACETAMINOPHEN 5-325 MG PO TABS: 1.0000 | ORAL_TABLET | Freq: Four times a day (QID) | ORAL | 0 refills | Status: DC | PRN

## 2023-08-15 MED ORDER — SODIUM CHLORIDE 0.9 % IR SOLN
Status: DC | PRN
  Administered 2023-08-15: 1000 mL via INTRAVESICAL

## 2023-08-15 MED ORDER — ONDANSETRON HCL 4 MG/2ML IJ SOLN
INTRAMUSCULAR | Status: DC | PRN
  Administered 2023-08-15: 4 mg via INTRAVENOUS

## 2023-08-15 MED ORDER — SODIUM CHLORIDE 0.9% FLUSH: 3.0000 mL | Freq: Two times a day (BID) | INTRAVENOUS | Status: DC

## 2023-08-15 MED ORDER — OXYCODONE HCL 5 MG PO TABS: 5.0000 mg | ORAL_TABLET | Freq: Once | ORAL | Status: DC | PRN

## 2023-08-15 MED ORDER — CIPROFLOXACIN IN D5W 400 MG/200ML IV SOLN
400.0000 mg | INTRAVENOUS | Status: AC
  Administered 2023-08-15: 400 mg via INTRAVENOUS
  Filled 2023-08-15: qty 200

## 2023-08-15 MED ORDER — INSULIN ASPART 100 UNIT/ML IJ SOLN: 0.0000 [IU] | INTRAMUSCULAR | Status: DC | PRN

## 2023-08-15 MED ORDER — OXYCODONE HCL 5 MG/5ML PO SOLN: 5.0000 mg | Freq: Once | ORAL | Status: DC | PRN

## 2023-08-15 MED ORDER — LIDOCAINE HCL (CARDIAC) PF 100 MG/5ML IV SOSY
PREFILLED_SYRINGE | INTRAVENOUS | Status: DC | PRN
  Administered 2023-08-15: 40 mg via INTRAVENOUS

## 2023-08-15 SURGICAL SUPPLY — 23 items
BAG URO CATCHER STRL LF (MISCELLANEOUS) ×1 IMPLANT
BASKET STONE NCOMPASS (UROLOGICAL SUPPLIES) IMPLANT
CATH URETERAL DUAL LUMEN 10F (MISCELLANEOUS) IMPLANT
CATH URETL OPEN 5X70 (CATHETERS) IMPLANT
CLOTH BEACON ORANGE TIMEOUT ST (SAFETY) ×1 IMPLANT
EXTRACTOR STONE NITINOL NGAGE (UROLOGICAL SUPPLIES) IMPLANT
GLOVE SURG SS PI 8.0 STRL IVOR (GLOVE) ×1 IMPLANT
GOWN STRL REUS W/ TWL XL LVL3 (GOWN DISPOSABLE) ×1 IMPLANT
GOWN STRL REUS W/TWL XL LVL3 (GOWN DISPOSABLE) ×1
GUIDEWIRE STR DUAL SENSOR (WIRE) ×1 IMPLANT
IV NS IRRIG 3000ML ARTHROMATIC (IV SOLUTION) ×1 IMPLANT
KIT TURNOVER KIT A (KITS) IMPLANT
LASER FIB FLEXIVA PULSE ID 365 (Laser) IMPLANT
LASER FIB FLEXIVA PULSE ID 550 (Laser) IMPLANT
LASER FIB FLEXIVA PULSE ID 910 (Laser) IMPLANT
MANIFOLD NEPTUNE II (INSTRUMENTS) ×1 IMPLANT
PACK CYSTO (CUSTOM PROCEDURE TRAY) ×1 IMPLANT
SHEATH NAVIGATOR HD 11/13X36 (SHEATH) IMPLANT
STENT URET 6FRX26 CONTOUR (STENTS) IMPLANT
TRACTIP FLEXIVA PULS ID 200XHI (Laser) IMPLANT
TRACTIP FLEXIVA PULSE ID 200 (Laser) ×1
TUBING CONNECTING 10 (TUBING) ×1 IMPLANT
TUBING UROLOGY SET (TUBING) ×1 IMPLANT

## 2023-08-15 NOTE — Interval H&P Note (Signed)
History and Physical Interval Note:  Culture was negative on 8/14.   08/15/2023 9:49 AM  Garrett [name]  has presented today for surgery, with the diagnosis of RIGHT URETERAL STONE.  The various methods of treatment have been discussed with the patient and family. After consideration of risks, benefits and other options for treatment, the patient has consented to  Procedure(s) with comments: CYSTOSCOPY RIGHT URETEROSCOPY/HOLMIUM LASER/STENT EXCHANGE (Right) - 1 HR FOR CASE as a surgical intervention.  The patient's history has been reviewed, patient examined, no change in status, stable for surgery.  I have reviewed the patient's chart and labs.  Questions were answered to the patient's satisfaction.     Bjorn Pippin

## 2023-08-15 NOTE — Op Note (Signed)
Procedure: 1.  Cystoscopy with right ureteroscopic stone extraction. 2.  Right ureteroscopy with holmium laser application and stent exchange. 3.  Application of fluoroscopy.  Preop diagnosis: Right ureteral and renal stones.  Postop diagnosis: Same.  Surgeon: Dr. Bjorn Pippin.  Anesthesia: General.  Specimen: Stones.  Drains: 6 French by 26 cm right contour double-J stent with tether.  EBL: None.  Complications: None.  Indications: The patient is a 72 year old male who was originally seen earlier this month for a febrile urinary tract infection with an obstructing right mid ureteral stone.  He underwent stenting and treatment of his urinary infection.  A follow-up culture on 814 was negative.  He returns today for stone removal.  Procedure: He was taken operating room where a general anesthetic was induced.  He was given Cipro 4 mg IV.  He was placed in lithotomy position and fitted with PAS hose.  His perineum and genitalia were prepped Betadine solution was draped in usual sterile fashion.  Cystoscopy was performed using a 21 [place] scope and 30 degree lens.  Examination revealed a normal urethra with the exception of a mild bulbar stricture which did not prevent scope passage.  The prostatic urethra short with lateral lobe hyperplasia.  Examination of bladder revealed mild trabeculation with some stent irritation.  The urine was turbid with some flocculent material requiring a bit of irrigation.  Because of this finding I added fluconazole 200 mg to his antibiotics.  The stent was grasped at the right ureteral orifice and pulled the urethral meatus.  A sensor wire was advanced the kidney under fluoroscopic guidance and the stent was removed.  A 11/13 French 36 cm digital access sheath was advanced over the wire easily to the level of the stone that was seen in the mid ureter.  The 6.5 French dual-lumen semirigid ureteroscope was then advanced alongside the wire without difficulty.   The stone was visualized and grasped with an engage basket and removed intact.  The access sheath was then replaced to the level of the UPJ and the inner core and wire removed.  The dual-lumen digital flexible ureteroscope was then advanced to the kidney and the stone was identified in the lower pole of the kidney is noted on prior CT.  The stone was then fragmented using the 242 m tract of laser fiber initially with the laser set on 0.5 J and 20 Hz with a subsequent power increased to 0.8 J.  The stone was broken into manageable fragments which were then removed using the engage basket.  Once all significant fragments were removed and only grit remained ureteroscope was removed.  A sensor wire was then advanced back to the kidney under fluoroscopic guidance and the access sheath was removed.  A 6 French by 26 cm contour double-J stent was then advanced over the wire to the kidney under fluoroscopic guidance.  The wire was removed, a good coil in the kidney and the distal stent in the distal ureter.  The cystoscope was then passed and the stent was pulled into the proper position with confirmation visually of the distal end and fluoroscopically at the proximal end.  The bladder was drained and the cystoscope was removed.  The stent string was left exiting the urethra.  The string was secured to the patient's penis.  He was taken down from lithotomy position, his anesthetic was reversed and he was moved recovery room in stable condition.  There were no complications.  I gave the stone fragments to the  family to bring to the office for analysis.

## 2023-08-15 NOTE — Anesthesia Procedure Notes (Signed)
Procedure Name: LMA Insertion Date/Time: 08/15/2023 10:13 AM  Performed by: Sindy Guadeloupe, CRNAPre-anesthesia Checklist: Patient identified, Emergency Drugs available, Suction available, Patient being monitored and Timeout performed Patient Re-evaluated:Patient Re-evaluated prior to induction Oxygen Delivery Method: Circle system utilized Preoxygenation: Pre-oxygenation with 100% oxygen Induction Type: IV induction Ventilation: Mask ventilation without difficulty LMA: LMA inserted LMA Size: 4.0 Number of attempts: 1 Tube secured with: Tape Dental Injury: Teeth and Oropharynx as per pre-operative assessment

## 2023-08-15 NOTE — Transfer of Care (Signed)
Immediate Anesthesia Transfer of Care Note  Patient: Garrett [name]  Procedure(s) Performed: CYSTOSCOPY RIGHT URETEROSCOPY/HOLMIUM LASER/STENT EXCHANGE (Right)  Patient Location: PACU  Anesthesia Type:General  Level of Consciousness: awake, alert , oriented, and patient cooperative  Airway & Oxygen Therapy: Patient Spontanous Breathing and Patient connected to face mask oxygen  Post-op Assessment: Report given to RN and Post -op Vital signs reviewed and stable  Post vital signs: Reviewed and stable  Last Vitals:  Vitals Value Taken Time  BP    Temp    Pulse    Resp    SpO2      Last Pain:  Vitals:   08/15/23 0917  TempSrc: Oral  PainSc:          Complications: No notable events documented.

## 2023-08-15 NOTE — Discharge Instructions (Addendum)
You may remove the stent by pulling the attached string on Monday morning and if you don't feel you can do that, call the office to arrange an appointment to have it removed.

## 2023-08-18 ENCOUNTER — Encounter (HOSPITAL_COMMUNITY): Payer: Self-pay | Admitting: Urology

## 2023-08-18 NOTE — Anesthesia Postprocedure Evaluation (Signed)
Anesthesia Post Note  Patient: Garrett [name]  Procedure(s) Performed: CYSTOSCOPY RIGHT URETEROSCOPY/HOLMIUM LASER/STENT EXCHANGE (Right)     Patient location during evaluation: PACU Anesthesia Type: General Level of consciousness: awake and alert Pain management: pain level controlled Vital Signs Assessment: post-procedure vital signs reviewed and stable Respiratory status: spontaneous breathing, nonlabored ventilation, respiratory function stable and patient connected to nasal cannula oxygen Cardiovascular status: blood pressure returned to baseline and stable Postop Assessment: no apparent nausea or vomiting Anesthetic complications: no   No notable events documented.  Last Vitals:  Vitals:   08/15/23 1130 08/15/23 1201  BP: 120/75 116/74  Pulse: 63 61  Resp: 14 17  Temp:  36.5 C  SpO2: 98% 99%    Last Pain:  Vitals:   08/15/23 1201  TempSrc:   PainSc: 0-No pain                 Hesper Venturella S

## 2023-08-20 ENCOUNTER — Other Ambulatory Visit: Payer: Self-pay | Admitting: Family Medicine

## 2023-08-20 ENCOUNTER — Other Ambulatory Visit: Payer: Self-pay

## 2023-08-20 ENCOUNTER — Other Ambulatory Visit (HOSPITAL_COMMUNITY): Payer: Self-pay

## 2023-08-20 ENCOUNTER — Encounter: Payer: Self-pay | Admitting: Pharmacist

## 2023-08-20 DIAGNOSIS — R059 Cough, unspecified: Secondary | ICD-10-CM

## 2023-08-20 MED ORDER — LEVOCETIRIZINE DIHYDROCHLORIDE 5 MG PO TABS
5.0000 mg | ORAL_TABLET | Freq: Every evening | ORAL | 0 refills | Status: DC
Start: 2023-08-20 — End: 2023-12-03
  Filled 2023-08-20 – 2023-08-29 (×3): qty 90, 90d supply, fill #0

## 2023-08-20 NOTE — Telephone Encounter (Signed)
Last refill 05/12/23 #90

## 2023-08-21 ENCOUNTER — Encounter: Payer: PPO | Admitting: Physical Therapy

## 2023-08-21 ENCOUNTER — Other Ambulatory Visit (HOSPITAL_COMMUNITY): Payer: Self-pay

## 2023-08-21 ENCOUNTER — Other Ambulatory Visit: Payer: Self-pay

## 2023-08-21 MED ORDER — GABAPENTIN 100 MG PO CAPS
200.0000 mg | ORAL_CAPSULE | Freq: Every day | ORAL | 0 refills | Status: DC
  Filled 2023-08-21 – 2023-08-29 (×3): qty 180, 90d supply, fill #0

## 2023-08-29 ENCOUNTER — Other Ambulatory Visit: Payer: Self-pay

## 2023-08-29 ENCOUNTER — Encounter: Payer: Self-pay | Admitting: Family Medicine

## 2023-08-29 ENCOUNTER — Other Ambulatory Visit: Payer: Self-pay | Admitting: Family Medicine

## 2023-08-29 ENCOUNTER — Other Ambulatory Visit (HOSPITAL_COMMUNITY): Payer: Self-pay

## 2023-08-29 DIAGNOSIS — E1169 Type 2 diabetes mellitus with other specified complication: Secondary | ICD-10-CM

## 2023-08-29 MED ORDER — FENOFIBRATE 160 MG PO TABS
160.0000 mg | ORAL_TABLET | Freq: Every day | ORAL | 3 refills | Status: DC
Start: 2023-08-29 — End: 2023-08-29

## 2023-08-29 MED ORDER — FENOFIBRATE 160 MG PO TABS
160.0000 mg | ORAL_TABLET | Freq: Every day | ORAL | 3 refills | Status: DC
Start: 2023-08-29 — End: 2024-04-01
  Filled 2023-08-29 – 2024-01-20 (×3): qty 90, 90d supply, fill #0

## 2023-08-29 NOTE — Telephone Encounter (Signed)
I don't see this medication on his med list? Please advise

## 2023-09-02 ENCOUNTER — Encounter: Payer: Self-pay | Admitting: Physician Assistant

## 2023-09-02 ENCOUNTER — Other Ambulatory Visit (HOSPITAL_COMMUNITY): Payer: Self-pay

## 2023-09-02 ENCOUNTER — Ambulatory Visit (INDEPENDENT_AMBULATORY_CARE_PROVIDER_SITE_OTHER): Payer: PPO | Admitting: Physician Assistant

## 2023-09-02 VITALS — BP 120/68 | HR 109 | Temp 99.2°F | Resp 20 | Wt 187.8 lb

## 2023-09-02 DIAGNOSIS — J208 Acute bronchitis due to other specified organisms: Secondary | ICD-10-CM

## 2023-09-02 DIAGNOSIS — B9689 Other specified bacterial agents as the cause of diseases classified elsewhere: Secondary | ICD-10-CM | POA: Diagnosis not present

## 2023-09-02 MED ORDER — FENOFIBRATE 160 MG PO TABS
160.0000 mg | ORAL_TABLET | Freq: Every day | ORAL | 3 refills | Status: DC
Start: 2023-08-29 — End: 2024-09-24
  Filled 2023-09-02: qty 90, 90d supply, fill #0
  Filled 2024-04-06: qty 90, 90d supply, fill #1
  Filled 2024-07-09: qty 90, 90d supply, fill #2

## 2023-09-02 MED ORDER — AMOXICILLIN 875 MG PO TABS: 875.0000 mg | ORAL_TABLET | Freq: Two times a day (BID) | ORAL | 0 refills | Status: AC

## 2023-09-02 MED ORDER — AMOXICILLIN 875 MG PO TABS
875.0000 mg | ORAL_TABLET | Freq: Two times a day (BID) | ORAL | 0 refills | Status: DC
  Filled 2023-09-02: qty 14, 7d supply, fill #0

## 2023-09-02 NOTE — Progress Notes (Signed)
Established patient visit   Patient: Garrett [name]   DOB: 12/25/1950   72 y.o. Male  MRN: [id number] Visit Date: 09/02/2023  Today's healthcare provider: Alfredia Ferguson, PA-C   Cc. Cough, congestion x 2 weeks  Subjective    HPI  Pt reports two weeks of cough, productive with green/yellow mucous, chest congestion, slight SOB, and fatigue. Denies fevers, wheezing. Using sudafed and a tessalon rx he had left.  Medications: Outpatient Medications Prior to Visit  Medication Sig   albuterol (PROAIR HFA) 108 (90 Base) MCG/ACT inhaler Inhale 1 puff into the lungs every 6 (six) hours as needed for wheezing or shortness of breath.   allopurinol (ZYLOPRIM) 100 MG tablet Take 1 tablet (100 mg total) by mouth daily.   atorvastatin (LIPITOR) 80 MG tablet Take 1 tablet (80 mg total) by mouth daily.   EPINEPHrine 0.3 mg/0.3 mL IJ SOAJ injection Inject 0.3 mg into the muscle as needed.   fenofibrate 160 MG tablet Take 1 tablet (160 mg total) by mouth daily.   Ferrous Sulfate (IRON PO) Take 1 tablet by mouth daily.   fluticasone (FLONASE) 50 MCG/ACT nasal spray Place 2 sprays into both nostrils daily.   Fluticasone-Umeclidin-Vilant (TRELEGY ELLIPTA) 100-62.5-25 MCG/ACT AEPB Inhale 1 puff into the lungs daily.   furosemide (LASIX) 40 MG tablet Take 1 tablet (40 mg total) by mouth 2 (two) times daily.   gabapentin (NEURONTIN) 100 MG capsule Take 2 capsules (200 mg total) by mouth at bedtime.   glucose blood (ONETOUCH ULTRA) test strip USE AS DIRECTED 3 TIMES  DAILY   HYDROcodone-acetaminophen (NORCO/VICODIN) 5-325 MG tablet Take 1 tablet by mouth every 6 (six) hours as needed for moderate pain.   levocetirizine (XYZAL) 5 MG tablet Take 1 tablet (5 mg total) by mouth every evening.   magic mouthwash w/lidocaine SOLN Take 5 mLs by mouth 4 (four) times daily as needed for mouth pain. Swish and Spit   memantine (NAMENDA) 10 MG tablet Take 2 tablets (20 mg total) by mouth daily.   metFORMIN  (GLUCOPHAGE-XR) 500 MG 24 hr tablet Take 1 tablet (500 mg) by mouth daily with breakfast.   Metoprolol Tartrate 75 MG TABS Take 1 tablet (75 mg total) by mouth 2 (two) times daily.   mometasone-formoterol (DULERA) 100-5 MCG/ACT AERO Inhale 1-2 puffs into the lungs 2 (two) times daily.   Multiple Vitamins-Minerals (ONE-A-DAY WEIGHT SMART ADVANCE PO) Take 1 tablet by mouth daily. Centrum Silver   nitroGLYCERIN (NITROSTAT) 0.4 MG SL tablet Place 1 tablet (0.4 mg total) under the tongue every 5 (five) minutes as needed for chest pain.   omeprazole (PRILOSEC) 40 MG capsule Take 1 capsule (40 mg total) by mouth 2 (two) times daily shortly before a meal (breakfast and dinner).   potassium chloride SA (KLOR-CON M) 20 MEQ tablet Take 2 tablets (40 mEq total) by mouth daily.   rivaroxaban (XARELTO) 10 MG TABS tablet Take 1 tablet (10 mg total) by mouth daily.   Semaglutide (OZEMPIC, 0.25 OR 0.5 MG/DOSE, [place]) Inject 0.5 mg into the skin once a week. Sunday   tamsulosin (FLOMAX) 0.4 MG CAPS capsule Take 1 capsule (0.4 mg total) by mouth daily.   topiramate (TOPAMAX) 50 MG tablet Take 1 tablet (50 mg total) by mouth 2 (two) times daily.   vitamin B-12 (CYANOCOBALAMIN) 1000 MCG tablet Take 1,000 mcg by mouth daily.   No facility-administered medications prior to visit.    Review of Systems  Constitutional:  Positive for fatigue.  Negative for fever.  HENT:  Positive for congestion.   Respiratory:  Positive for cough. Negative for shortness of breath.   Cardiovascular:  Negative for chest pain, palpitations and leg swelling.  Neurological:  Negative for dizziness and headaches.      Objective    BP 120/68 (BP Location: Left Arm, Patient Position: Sitting, Cuff Size: Normal)   Pulse (!) 109   Temp 99.2 F (37.3 C) (Oral)   Resp 20   Wt 187 lb 12.8 oz (85.2 kg)   SpO2 98%   BMI 27.73 kg/m   Physical Exam Constitutional:      General: He is awake.     Appearance: He is well-developed.  HENT:      Head: Normocephalic.  Eyes:     Conjunctiva/sclera: Conjunctivae normal.  Cardiovascular:     Rate and Rhythm: Normal rate and regular rhythm.     Heart sounds: Normal heart sounds.  Pulmonary:     Effort: Pulmonary effort is normal.     Breath sounds: Normal breath sounds. No wheezing, rhonchi or rales.  Skin:    General: Skin is warm.  Neurological:     Mental Status: He is alert and oriented to person, place, and time.  Psychiatric:        Attention and Perception: Attention normal.        Mood and Affect: Mood normal.        Speech: Speech normal.        Behavior: Behavior is cooperative.      No results found for any visits on 09/02/23.  Assessment & Plan     1. Acute bacterial bronchitis Given prolonged nature and pt's poor health, will tx with abx Amoxicillin bid x 7 days  Cont tessalon, recommending mucinex.  Vitals stable.   If symptoms are not improved or worsen through the end of the week, please contact the office.  - amoxicillin (AMOXIL) 875 MG tablet; Take 1 tablet (875 mg total) by mouth 2 (two) times daily for 7 days.  Dispense: 14 tablet; Refill: 0  Return if symptoms worsen or fail to improve.      I, Alfredia Ferguson, PA-C have reviewed all documentation for this visit. The documentation on  09/02/23   for the exam, diagnosis, procedures, and orders are all accurate and complete.    Alfredia Ferguson, PA-C  [facility] [phone] (phone) [phone] (fax)  [facility]

## 2023-09-03 ENCOUNTER — Other Ambulatory Visit (HOSPITAL_COMMUNITY): Payer: Self-pay

## 2023-09-04 ENCOUNTER — Other Ambulatory Visit (HOSPITAL_COMMUNITY): Payer: Self-pay

## 2023-09-04 ENCOUNTER — Other Ambulatory Visit: Payer: Self-pay

## 2023-09-04 ENCOUNTER — Encounter: Payer: Self-pay | Admitting: Pharmacist

## 2023-09-04 MED ORDER — TRELEGY ELLIPTA 100-62.5-25 MCG/ACT IN AEPB
1.0000 | INHALATION_SPRAY | Freq: Every day | RESPIRATORY_TRACT | 11 refills | Status: DC
  Filled 2023-09-04: qty 60, 30d supply, fill #0

## 2023-09-04 MED ORDER — METFORMIN HCL ER 500 MG PO TB24
500.0000 mg | ORAL_TABLET | Freq: Every day | ORAL | 1 refills | Status: DC
  Filled 2023-09-04 – 2023-10-01 (×2): qty 93, 93d supply, fill #0

## 2023-09-04 MED ORDER — TOPIRAMATE 50 MG PO TABS
50.0000 mg | ORAL_TABLET | Freq: Two times a day (BID) | ORAL | 3 refills | Status: DC
  Filled 2023-09-04: qty 180, 90d supply, fill #0

## 2023-09-04 MED ORDER — CELECOXIB 200 MG PO CAPS
200.0000 mg | ORAL_CAPSULE | Freq: Two times a day (BID) | ORAL | 1 refills | Status: DC
  Filled 2023-09-04 – 2024-01-20 (×2): qty 200, 100d supply, fill #0
  Filled 2024-03-31: qty 200, 100d supply, fill #1

## 2023-09-04 MED ORDER — FUROSEMIDE 40 MG PO TABS
40.0000 mg | ORAL_TABLET | Freq: Two times a day (BID) | ORAL | 3 refills | Status: DC
  Filled 2023-09-04: qty 180, 90d supply, fill #0

## 2023-09-04 MED ORDER — NITROGLYCERIN 0.4 MG SL SUBL: SUBLINGUAL_TABLET | SUBLINGUAL | 1 refills | Status: DC

## 2023-09-04 MED ORDER — METOPROLOL TARTRATE 75 MG PO TABS
75.0000 mg | ORAL_TABLET | Freq: Two times a day (BID) | ORAL | 3 refills | Status: DC
  Filled 2023-09-04 – 2023-11-05 (×2): qty 180, 90d supply, fill #0

## 2023-09-04 MED ORDER — ATORVASTATIN CALCIUM 80 MG PO TABS
80.0000 mg | ORAL_TABLET | Freq: Every day | ORAL | 3 refills | Status: DC
  Filled 2023-09-04: qty 90, 90d supply, fill #0

## 2023-09-05 ENCOUNTER — Other Ambulatory Visit: Payer: Self-pay

## 2023-10-01 ENCOUNTER — Other Ambulatory Visit: Payer: Self-pay

## 2023-10-17 ENCOUNTER — Other Ambulatory Visit (HOSPITAL_COMMUNITY): Payer: Self-pay

## 2023-10-21 ENCOUNTER — Telehealth: Payer: Self-pay

## 2023-10-21 NOTE — Telephone Encounter (Signed)
Pt called and notified that Ozempic has arrived. Pt verbalized understanding. Placed in frig.

## 2023-10-27 ENCOUNTER — Other Ambulatory Visit: Payer: Self-pay

## 2023-10-27 ENCOUNTER — Other Ambulatory Visit (HOSPITAL_COMMUNITY): Payer: Self-pay

## 2023-10-28 ENCOUNTER — Other Ambulatory Visit (HOSPITAL_COMMUNITY): Payer: Self-pay

## 2023-10-31 ENCOUNTER — Encounter: Payer: Self-pay | Admitting: Podiatry

## 2023-10-31 ENCOUNTER — Ambulatory Visit: Payer: PPO | Admitting: Podiatry

## 2023-10-31 VITALS — Ht 69.0 in | Wt 187.8 lb

## 2023-10-31 DIAGNOSIS — M79674 Pain in right toe(s): Secondary | ICD-10-CM

## 2023-10-31 DIAGNOSIS — E1121 Type 2 diabetes mellitus with diabetic nephropathy: Secondary | ICD-10-CM

## 2023-10-31 DIAGNOSIS — B351 Tinea unguium: Secondary | ICD-10-CM

## 2023-10-31 DIAGNOSIS — M79675 Pain in left toe(s): Secondary | ICD-10-CM

## 2023-10-31 NOTE — Progress Notes (Signed)
This patient returns to my office for at risk foot care.  This patient requires this care by a professional since this patient will be at risk due to having diabetes and coagulation defect. This patient is unable to cut nails himself since the patient cannot reach his nails.These nails are painful walking and wearing shoes.  This patient presents for at risk foot care today.  General Appearance  Alert, conversant and in no acute stress.  Vascular  Dorsalis pedis and posterior tibial  pulses are palpable  bilaterally.  Capillary return is within normal limits  bilaterally. Cold feet. bilaterally.  Neurologic  Senn-Weinstein monofilament wire test diminished/absent bilaterally. Muscle power within normal limits bilaterally.  Nails Thick disfigured discolored nails with subungual debris  from hallux to fifth toes bilaterally. No evidence of bacterial infection or drainage bilaterally.  Orthopedic  No limitations of motion  feet .  No crepitus or effusions noted.  No bony pathology or digital deformities noted.  Midfoot arthritis  B/L.  Skin  normotropic skin with no porokeratosis noted bilaterally.  No signs of infections or ulcers noted.     Onychomycosis  Pain in right toes  Pain in left toes  Consent was obtained for treatment procedures.   Mechanical debridement of nails 1-5  bilaterally performed with a nail nipper.  Filed with dremel without incident.    Return office visit    4  months                 Told patient to return for periodic foot care and evaluation due to potential at risk complications.   Helane Gunther DPM

## 2023-11-05 ENCOUNTER — Other Ambulatory Visit: Payer: Self-pay

## 2023-11-05 ENCOUNTER — Other Ambulatory Visit (HOSPITAL_COMMUNITY): Payer: Self-pay

## 2023-11-13 ENCOUNTER — Other Ambulatory Visit (HOSPITAL_COMMUNITY): Payer: Self-pay

## 2023-11-14 ENCOUNTER — Other Ambulatory Visit: Payer: Self-pay

## 2023-11-14 ENCOUNTER — Other Ambulatory Visit (HOSPITAL_COMMUNITY): Payer: Self-pay

## 2023-11-14 ENCOUNTER — Encounter: Payer: Self-pay | Admitting: Pharmacist

## 2023-11-14 MED ORDER — ATORVASTATIN CALCIUM 80 MG PO TABS
80.0000 mg | ORAL_TABLET | Freq: Every day | ORAL | 3 refills | Status: DC
  Filled 2023-11-14: qty 90, 90d supply, fill #0
  Filled 2024-03-08: qty 90, 90d supply, fill #1

## 2023-11-14 MED ORDER — FUROSEMIDE 40 MG PO TABS
40.0000 mg | ORAL_TABLET | Freq: Two times a day (BID) | ORAL | 3 refills | Status: DC
  Filled 2023-11-14: qty 180, 90d supply, fill #0
  Filled 2024-03-08: qty 180, 90d supply, fill #1

## 2023-11-14 MED ORDER — POTASSIUM CITRATE ER 10 MEQ (1080 MG) PO TBCR
10.0000 meq | EXTENDED_RELEASE_TABLET | Freq: Three times a day (TID) | ORAL | 3 refills | Status: DC
  Filled 2023-11-14: qty 270, 90d supply, fill #0
  Filled 2024-03-08: qty 270, 90d supply, fill #1
  Filled 2024-06-14: qty 270, 90d supply, fill #2

## 2023-11-14 MED ORDER — METOPROLOL TARTRATE 75 MG PO TABS
75.0000 mg | ORAL_TABLET | Freq: Two times a day (BID) | ORAL | 3 refills | Status: AC
  Filled 2023-11-14 – 2024-09-24 (×2): qty 180, 90d supply, fill #0

## 2023-11-26 ENCOUNTER — Other Ambulatory Visit: Payer: Self-pay | Admitting: Family Medicine

## 2023-11-26 ENCOUNTER — Other Ambulatory Visit (HOSPITAL_COMMUNITY): Payer: Self-pay

## 2023-11-26 DIAGNOSIS — M1A10X0 Lead-induced chronic gout, unspecified site, without tophus (tophi): Secondary | ICD-10-CM

## 2023-11-26 MED ORDER — ALLOPURINOL 100 MG PO TABS
100.0000 mg | ORAL_TABLET | Freq: Every day | ORAL | 0 refills | Status: DC
Start: 2023-11-26 — End: 2024-02-17
  Filled 2023-11-26: qty 90, 90d supply, fill #0

## 2023-12-03 ENCOUNTER — Other Ambulatory Visit: Payer: Self-pay

## 2023-12-03 ENCOUNTER — Other Ambulatory Visit: Payer: Self-pay | Admitting: Family Medicine

## 2023-12-03 ENCOUNTER — Other Ambulatory Visit (HOSPITAL_COMMUNITY): Payer: Self-pay

## 2023-12-03 DIAGNOSIS — I10 Essential (primary) hypertension: Secondary | ICD-10-CM

## 2023-12-03 DIAGNOSIS — R059 Cough, unspecified: Secondary | ICD-10-CM

## 2023-12-03 MED ORDER — MEMANTINE HCL 10 MG PO TABS
20.0000 mg | ORAL_TABLET | Freq: Every day | ORAL | 1 refills | Status: DC
  Filled 2023-12-03: qty 180, 90d supply, fill #0
  Filled 2024-02-03: qty 180, 90d supply, fill #1

## 2023-12-03 MED ORDER — POTASSIUM CHLORIDE CRYS ER 20 MEQ PO TBCR
40.0000 meq | EXTENDED_RELEASE_TABLET | Freq: Every day | ORAL | 1 refills | Status: DC
Start: 2023-12-03 — End: 2024-04-30
  Filled 2023-12-03: qty 180, 90d supply, fill #0
  Filled 2024-02-03: qty 180, 90d supply, fill #1

## 2023-12-03 MED ORDER — LEVOCETIRIZINE DIHYDROCHLORIDE 5 MG PO TABS
5.0000 mg | ORAL_TABLET | Freq: Every evening | ORAL | 1 refills | Status: DC
Start: 2023-12-03 — End: 2024-04-30
  Filled 2023-12-03: qty 90, 90d supply, fill #0
  Filled 2024-02-03: qty 90, 90d supply, fill #1

## 2023-12-12 ENCOUNTER — Encounter: Payer: Self-pay | Admitting: Neurology

## 2023-12-18 ENCOUNTER — Ambulatory Visit: Payer: PPO | Admitting: Neurology

## 2023-12-18 ENCOUNTER — Encounter: Payer: Self-pay | Admitting: Neurology

## 2023-12-18 VITALS — BP 123/76 | HR 73 | Ht 69.0 in | Wt 188.4 lb

## 2023-12-18 DIAGNOSIS — M21371 Foot drop, right foot: Secondary | ICD-10-CM

## 2023-12-18 DIAGNOSIS — G629 Polyneuropathy, unspecified: Secondary | ICD-10-CM

## 2023-12-18 DIAGNOSIS — I872 Venous insufficiency (chronic) (peripheral): Secondary | ICD-10-CM | POA: Diagnosis not present

## 2023-12-18 DIAGNOSIS — M21372 Foot drop, left foot: Secondary | ICD-10-CM

## 2023-12-18 DIAGNOSIS — G43109 Migraine with aura, not intractable, without status migrainosus: Secondary | ICD-10-CM

## 2023-12-18 NOTE — Patient Instructions (Addendum)
Always good to see you.  We will send the order for PT for bilateral foot drop, right leg weakness  2. Referral will also be sent to Vascular Surgery   3. I will speak with our Neuromuscular docs and let you know if they feel any further tests need to be done (such as a spinal tap)  4. Follow-up in 6 months, call for any changes
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
## 2023-12-29 ENCOUNTER — Other Ambulatory Visit (HOSPITAL_COMMUNITY): Payer: Self-pay

## 2023-12-29 ENCOUNTER — Other Ambulatory Visit: Payer: Self-pay

## 2023-12-30 ENCOUNTER — Other Ambulatory Visit (HOSPITAL_COMMUNITY): Payer: Self-pay

## 2023-12-30 ENCOUNTER — Other Ambulatory Visit: Payer: Self-pay
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
## 2024-01-06 ENCOUNTER — Other Ambulatory Visit: Payer: Self-pay

## 2024-01-06 ENCOUNTER — Encounter: Payer: Self-pay | Admitting: Physical Therapy

## 2024-01-06 ENCOUNTER — Ambulatory Visit: Payer: PPO | Attending: Orthopedic Surgery | Admitting: Physical Therapy

## 2024-01-06 DIAGNOSIS — M542 Cervicalgia: Secondary | ICD-10-CM | POA: Diagnosis present

## 2024-01-06 DIAGNOSIS — G629 Polyneuropathy, unspecified: Secondary | ICD-10-CM | POA: Diagnosis not present

## 2024-01-06 DIAGNOSIS — M21371 Foot drop, right foot: Secondary | ICD-10-CM | POA: Diagnosis not present

## 2024-01-06 DIAGNOSIS — M21372 Foot drop, left foot: Secondary | ICD-10-CM | POA: Insufficient documentation

## 2024-01-06 DIAGNOSIS — M6281 Muscle weakness (generalized): Secondary | ICD-10-CM | POA: Diagnosis present

## 2024-01-06 DIAGNOSIS — R2689 Other abnormalities of gait and mobility: Secondary | ICD-10-CM | POA: Diagnosis present

## 2024-01-06 DIAGNOSIS — R2681 Unsteadiness on feet: Secondary | ICD-10-CM | POA: Insufficient documentation

## 2024-01-06 DIAGNOSIS — M5459 Other low back pain: Secondary | ICD-10-CM | POA: Diagnosis present

## 2024-01-07 ENCOUNTER — Other Ambulatory Visit: Payer: Self-pay

## 2024-01-08 ENCOUNTER — Ambulatory Visit: Payer: PPO

## 2024-01-08 DIAGNOSIS — M542 Cervicalgia: Secondary | ICD-10-CM

## 2024-01-08 DIAGNOSIS — R2689 Other abnormalities of gait and mobility: Secondary | ICD-10-CM

## 2024-01-08 DIAGNOSIS — R2681 Unsteadiness on feet: Secondary | ICD-10-CM

## 2024-01-08 DIAGNOSIS — M5459 Other low back pain: Secondary | ICD-10-CM

## 2024-01-08 DIAGNOSIS — M6281 Muscle weakness (generalized): Secondary | ICD-10-CM
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
## 2024-01-13 ENCOUNTER — Ambulatory Visit: Payer: PPO | Admitting: Physical Therapy

## 2024-01-13 DIAGNOSIS — R2689 Other abnormalities of gait and mobility: Secondary | ICD-10-CM

## 2024-01-13 DIAGNOSIS — R2681 Unsteadiness on feet: Secondary | ICD-10-CM

## 2024-01-13 DIAGNOSIS — M542 Cervicalgia: Secondary | ICD-10-CM

## 2024-01-13 DIAGNOSIS — M6281 Muscle weakness (generalized): Secondary | ICD-10-CM
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
## 2024-01-20 ENCOUNTER — Ambulatory Visit: Payer: PPO

## 2024-01-20 ENCOUNTER — Other Ambulatory Visit: Payer: Self-pay

## 2024-01-20 ENCOUNTER — Other Ambulatory Visit (HOSPITAL_COMMUNITY): Payer: Self-pay

## 2024-01-20 DIAGNOSIS — R2681 Unsteadiness on feet: Secondary | ICD-10-CM

## 2024-01-20 DIAGNOSIS — M6281 Muscle weakness (generalized): Secondary | ICD-10-CM

## 2024-01-20 DIAGNOSIS — M542 Cervicalgia: Secondary | ICD-10-CM | POA: Diagnosis not present

## 2024-01-20 DIAGNOSIS — R2689 Other abnormalities of gait and mobility: Secondary | ICD-10-CM
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
## 2024-01-22 ENCOUNTER — Ambulatory Visit: Payer: PPO | Admitting: Physical Therapy

## 2024-01-22 ENCOUNTER — Encounter: Payer: Self-pay | Admitting: Physical Therapy

## 2024-01-22 DIAGNOSIS — M6281 Muscle weakness (generalized): Secondary | ICD-10-CM

## 2024-01-22 DIAGNOSIS — M542 Cervicalgia: Secondary | ICD-10-CM | POA: Diagnosis not present

## 2024-01-22 DIAGNOSIS — R2681 Unsteadiness on feet: Secondary | ICD-10-CM

## 2024-01-22 DIAGNOSIS — R2689 Other abnormalities of gait and mobility: Secondary | ICD-10-CM
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
## 2024-01-26 ENCOUNTER — Other Ambulatory Visit: Payer: Self-pay

## 2024-01-26 DIAGNOSIS — I872 Venous insufficiency (chronic) (peripheral): Secondary | ICD-10-CM

## 2024-01-27 ENCOUNTER — Ambulatory Visit: Payer: PPO | Attending: Orthopedic Surgery

## 2024-01-27 DIAGNOSIS — M542 Cervicalgia: Secondary | ICD-10-CM | POA: Diagnosis present

## 2024-01-27 DIAGNOSIS — M6281 Muscle weakness (generalized): Secondary | ICD-10-CM | POA: Diagnosis present

## 2024-01-27 DIAGNOSIS — M25661 Stiffness of right knee, not elsewhere classified: Secondary | ICD-10-CM | POA: Diagnosis present

## 2024-01-27 DIAGNOSIS — R2681 Unsteadiness on feet: Secondary | ICD-10-CM | POA: Insufficient documentation

## 2024-01-27 DIAGNOSIS — M5459 Other low back pain: Secondary | ICD-10-CM | POA: Diagnosis present

## 2024-01-27 DIAGNOSIS — R2689 Other abnormalities of gait and mobility: Secondary | ICD-10-CM | POA: Diagnosis present
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
## 2024-01-29 ENCOUNTER — Ambulatory Visit: Payer: PPO

## 2024-01-29 DIAGNOSIS — M6281 Muscle weakness (generalized): Secondary | ICD-10-CM

## 2024-01-29 DIAGNOSIS — R2681 Unsteadiness on feet: Secondary | ICD-10-CM

## 2024-01-29 DIAGNOSIS — M542 Cervicalgia: Secondary | ICD-10-CM | POA: Diagnosis not present
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
## 2024-01-30 ENCOUNTER — Ambulatory Visit (INDEPENDENT_AMBULATORY_CARE_PROVIDER_SITE_OTHER): Payer: PPO | Admitting: Podiatry

## 2024-01-30 ENCOUNTER — Encounter: Payer: Self-pay | Admitting: Podiatry

## 2024-01-30 DIAGNOSIS — M79674 Pain in right toe(s): Secondary | ICD-10-CM

## 2024-01-30 DIAGNOSIS — E1121 Type 2 diabetes mellitus with diabetic nephropathy: Secondary | ICD-10-CM | POA: Diagnosis not present

## 2024-01-30 DIAGNOSIS — B351 Tinea unguium: Secondary | ICD-10-CM

## 2024-01-30 DIAGNOSIS — M79675 Pain in left toe(s): Secondary | ICD-10-CM | POA: Diagnosis not present

## 2024-01-30 NOTE — Progress Notes (Signed)
This patient returns to my office for at risk foot care.  This patient requires this care by a professional since this patient will be at risk due to having diabetes and coagulation defect. This patient is unable to cut nails himself since the patient cannot reach his nails.These nails are painful walking and wearing shoes.  This patient presents for at risk foot care today.  General Appearance  Alert, conversant and in no acute stress.  Vascular  Dorsalis pedis and posterior tibial  pulses are palpable  bilaterally.  Capillary return is within normal limits  bilaterally. Cold feet. bilaterally.  Neurologic  Senn-Weinstein monofilament wire test diminished/absent bilaterally. Muscle power within normal limits bilaterally.  Nails Thick disfigured discolored nails with subungual debris  from hallux to fifth toes bilaterally. No evidence of bacterial infection or drainage bilaterally.  Orthopedic  No limitations of motion  feet .  No crepitus or effusions noted.  No bony pathology or digital deformities noted.  Midfoot arthritis  B/L.  Skin  normotropic skin with no porokeratosis noted bilaterally.  No signs of infections or ulcers noted.     Onychomycosis  Pain in right toes  Pain in left toes  Consent was obtained for treatment procedures.   Mechanical debridement of nails 1-5  bilaterally performed with a nail nipper.  Filed with dremel without incident.    Return office visit    4  months                 Told patient to return for periodic foot care and evaluation due to potential at risk complications.   Helane Gunther DPM

## 2024-02-03 ENCOUNTER — Other Ambulatory Visit (HOSPITAL_COMMUNITY): Payer: Self-pay

## 2024-02-03 ENCOUNTER — Other Ambulatory Visit: Payer: Self-pay | Admitting: Family Medicine

## 2024-02-03 ENCOUNTER — Encounter (HOSPITAL_COMMUNITY): Payer: Self-pay

## 2024-02-03 ENCOUNTER — Encounter: Payer: Self-pay | Admitting: Podiatry

## 2024-02-03 ENCOUNTER — Other Ambulatory Visit: Payer: Self-pay

## 2024-02-03 ENCOUNTER — Ambulatory Visit: Payer: PPO

## 2024-02-03 DIAGNOSIS — M542 Cervicalgia: Secondary | ICD-10-CM | POA: Diagnosis not present

## 2024-02-03 DIAGNOSIS — R2681 Unsteadiness on feet: Secondary | ICD-10-CM

## 2024-02-03 DIAGNOSIS — M6281 Muscle weakness (generalized): Secondary | ICD-10-CM

## 2024-02-03 DIAGNOSIS — I2699 Other pulmonary embolism without acute cor pulmonale: Secondary | ICD-10-CM

## 2024-02-03 MED ORDER — RIVAROXABAN 10 MG PO TABS
10.0000 mg | ORAL_TABLET | Freq: Every day | ORAL | 0 refills | Status: DC
Start: 2024-02-03 — End: 2024-05-27
  Filled 2024-02-03: qty 90, 90d supply, fill #0
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
## 2024-02-04 ENCOUNTER — Other Ambulatory Visit (HOSPITAL_COMMUNITY): Payer: Self-pay

## 2024-02-05 ENCOUNTER — Ambulatory Visit: Payer: PPO

## 2024-02-05 ENCOUNTER — Other Ambulatory Visit: Payer: Self-pay

## 2024-02-05 DIAGNOSIS — M6281 Muscle weakness (generalized): Secondary | ICD-10-CM

## 2024-02-05 DIAGNOSIS — M542 Cervicalgia: Secondary | ICD-10-CM

## 2024-02-05 DIAGNOSIS — R2689 Other abnormalities of gait and mobility: Secondary | ICD-10-CM

## 2024-02-05 DIAGNOSIS — R2681 Unsteadiness on feet: Secondary | ICD-10-CM
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
## 2024-02-06 ENCOUNTER — Ambulatory Visit (HOSPITAL_COMMUNITY)
Admission: RE | Admit: 2024-02-06 | Discharge: 2024-02-06 | Disposition: A | Discharge status: Home / Self Care | Payer: PPO | Source: Ambulatory Visit | Attending: Vascular Surgery | Admitting: Vascular Surgery

## 2024-02-06 ENCOUNTER — Ambulatory Visit: Payer: PPO | Admitting: Physician Assistant

## 2024-02-06 VITALS — BP 123/86 | HR 76 | Temp 97.4°F | Ht 69.0 in | Wt 192.3 lb

## 2024-02-06 DIAGNOSIS — I872 Venous insufficiency (chronic) (peripheral): Secondary | ICD-10-CM | POA: Diagnosis present
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
## 2024-02-10 ENCOUNTER — Ambulatory Visit: Payer: PPO

## 2024-02-10 DIAGNOSIS — R2681 Unsteadiness on feet: Secondary | ICD-10-CM

## 2024-02-10 DIAGNOSIS — R2689 Other abnormalities of gait and mobility: Secondary | ICD-10-CM

## 2024-02-10 DIAGNOSIS — M542 Cervicalgia: Secondary | ICD-10-CM

## 2024-02-10 DIAGNOSIS — M6281 Muscle weakness (generalized): Secondary | ICD-10-CM

## 2024-02-10 NOTE — Therapy (Addendum)
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 and Moist heat  PLAN FOR NEXT SESSION: focus on LE strengthening, balance and endurance, manual therapy to neck including TrDN to [place]/LS with PT.   Darleene Cleaver, PTA 02/10/2024, 1:59 PM

## 2024-02-12 ENCOUNTER — Encounter: Payer: Self-pay | Admitting: Physical Therapy

## 2024-02-12 ENCOUNTER — Ambulatory Visit: Payer: PPO | Admitting: Physical Therapy

## 2024-02-12 DIAGNOSIS — M6281 Muscle weakness (generalized): Secondary | ICD-10-CM

## 2024-02-12 DIAGNOSIS — R2681 Unsteadiness on feet: Secondary | ICD-10-CM

## 2024-02-12 DIAGNOSIS — R2689 Other abnormalities of gait and mobility: Secondary | ICD-10-CM

## 2024-02-12 DIAGNOSIS — M542 Cervicalgia: Secondary | ICD-10-CM
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
## 2024-02-17 ENCOUNTER — Ambulatory Visit: Payer: PPO

## 2024-02-17 ENCOUNTER — Other Ambulatory Visit (HOSPITAL_COMMUNITY): Payer: Self-pay

## 2024-02-17 ENCOUNTER — Other Ambulatory Visit: Payer: Self-pay | Admitting: Family Medicine

## 2024-02-17 ENCOUNTER — Other Ambulatory Visit: Payer: Self-pay

## 2024-02-17 DIAGNOSIS — M542 Cervicalgia: Secondary | ICD-10-CM

## 2024-02-17 DIAGNOSIS — R2681 Unsteadiness on feet: Secondary | ICD-10-CM

## 2024-02-17 DIAGNOSIS — R2689 Other abnormalities of gait and mobility: Secondary | ICD-10-CM

## 2024-02-17 DIAGNOSIS — T560X1D Toxic effect of lead and its compounds, accidental (unintentional), subsequent encounter: Secondary | ICD-10-CM

## 2024-02-17 DIAGNOSIS — M6281 Muscle weakness (generalized): Secondary | ICD-10-CM

## 2024-02-17 DIAGNOSIS — M25661 Stiffness of right knee, not elsewhere classified: Secondary | ICD-10-CM

## 2024-02-17 DIAGNOSIS — M5459 Other low back pain: Secondary | ICD-10-CM

## 2024-02-17 MED ORDER — ALLOPURINOL 100 MG PO TABS
100.0000 mg | ORAL_TABLET | Freq: Every day | ORAL | 0 refills | Status: DC
Start: 2024-02-17 — End: 2024-04-30
  Filled 2024-02-17: qty 90, 90d supply, fill #0
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
## 2024-02-19 ENCOUNTER — Ambulatory Visit: Payer: PPO

## 2024-02-19 DIAGNOSIS — M6281 Muscle weakness (generalized): Secondary | ICD-10-CM

## 2024-02-19 DIAGNOSIS — M542 Cervicalgia: Secondary | ICD-10-CM

## 2024-02-19 DIAGNOSIS — R2681 Unsteadiness on feet: Secondary | ICD-10-CM

## 2024-02-19 DIAGNOSIS — M5459 Other low back pain: Secondary | ICD-10-CM

## 2024-02-19 DIAGNOSIS — M25661 Stiffness of right knee, not elsewhere classified: Secondary | ICD-10-CM

## 2024-02-19 DIAGNOSIS — R2689 Other abnormalities of gait and mobility: Secondary | ICD-10-CM
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
## 2024-02-20 LAB — HM DIABETES EYE EXAM

## 2024-02-24 ENCOUNTER — Encounter: Payer: PPO | Admitting: Physical Therapy

## 2024-02-26 ENCOUNTER — Ambulatory Visit: Payer: PPO | Attending: Orthopedic Surgery

## 2024-02-26 DIAGNOSIS — R2689 Other abnormalities of gait and mobility: Secondary | ICD-10-CM | POA: Diagnosis present

## 2024-02-26 DIAGNOSIS — M542 Cervicalgia: Secondary | ICD-10-CM | POA: Diagnosis present

## 2024-02-26 DIAGNOSIS — R2681 Unsteadiness on feet: Secondary | ICD-10-CM | POA: Insufficient documentation

## 2024-02-26 DIAGNOSIS — M6281 Muscle weakness (generalized): Secondary | ICD-10-CM | POA: Diagnosis present

## 2024-02-26 DIAGNOSIS — M25661 Stiffness of right knee, not elsewhere classified: Secondary | ICD-10-CM | POA: Diagnosis present

## 2024-02-26 DIAGNOSIS — M5459 Other low back pain: Secondary | ICD-10-CM | POA: Diagnosis present

## 2024-02-26 NOTE — Therapy (Signed)
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 Joint mobilization, Joint manipulation, Spinal manipulation, Spinal mobilization, Vestibular training, Cryotherapy, and Moist heat  PLAN FOR NEXT SESSION: fill out NDI; focus on LE strengthening, balance and endurance, manual therapy to neck including TrDN to UT/LS with PT.   Darleene Cleaver, PTA 02/26/2024, 2:49 PM

## 2024-03-02 ENCOUNTER — Encounter: Payer: Self-pay | Admitting: Physical Therapy

## 2024-03-02 ENCOUNTER — Ambulatory Visit: Payer: PPO | Admitting: Physical Therapy

## 2024-03-02 DIAGNOSIS — R2681 Unsteadiness on feet: Secondary | ICD-10-CM

## 2024-03-02 DIAGNOSIS — M542 Cervicalgia: Secondary | ICD-10-CM | POA: Diagnosis not present

## 2024-03-02 DIAGNOSIS — M6281 Muscle weakness (generalized): Secondary | ICD-10-CM

## 2024-03-02 DIAGNOSIS — R2689 Other abnormalities of gait and mobility: Secondary | ICD-10-CM
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
## 2024-03-04 ENCOUNTER — Ambulatory Visit: Payer: PPO

## 2024-03-04 DIAGNOSIS — M542 Cervicalgia: Secondary | ICD-10-CM

## 2024-03-04 DIAGNOSIS — R2689 Other abnormalities of gait and mobility: Secondary | ICD-10-CM

## 2024-03-04 DIAGNOSIS — M6281 Muscle weakness (generalized): Secondary | ICD-10-CM

## 2024-03-04 DIAGNOSIS — R2681 Unsteadiness on feet: Secondary | ICD-10-CM

## 2024-03-04 DIAGNOSIS — M25661 Stiffness of right knee, not elsewhere classified: Secondary | ICD-10-CM

## 2024-03-04 DIAGNOSIS — M5459 Other low back pain: Secondary | ICD-10-CM
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
## 2024-03-08 ENCOUNTER — Other Ambulatory Visit: Payer: Self-pay | Admitting: Family Medicine

## 2024-03-08 ENCOUNTER — Ambulatory Visit: Payer: PPO

## 2024-03-08 ENCOUNTER — Other Ambulatory Visit (HOSPITAL_COMMUNITY): Payer: Self-pay

## 2024-03-08 DIAGNOSIS — E1149 Type 2 diabetes mellitus with other diabetic neurological complication: Secondary | ICD-10-CM

## 2024-03-08 DIAGNOSIS — M2141 Flat foot [pes planus] (acquired), right foot: Secondary | ICD-10-CM

## 2024-03-08 NOTE — Progress Notes (Signed)
 Patient presents to the office today for diabetic shoe and insole measuring.  Patient was measured with brannock device to determine size and width for 1 pair of extra depth shoes scans on file for 3 pair of custom DM inserts / insoles.   Documentation of medical necessity will be sent to patient's treating diabetic doctor to verify and sign.   Patient's diabetic provider: Loreen Freud -chase MD   Shoes and insoles will be ordered at that time and patient will be notified for an appointment for fitting when they arrive.   Shoe size (per patient): 9W Patient shoe selection- Shoe choice:   1260M / X920 M Shoe size ordered: 9WD  Ppw / ABN signed

## 2024-03-09 ENCOUNTER — Other Ambulatory Visit: Payer: Self-pay

## 2024-03-09 ENCOUNTER — Other Ambulatory Visit (HOSPITAL_COMMUNITY): Payer: Self-pay

## 2024-03-09 ENCOUNTER — Telehealth: Payer: Self-pay | Admitting: Emergency Medicine

## 2024-03-09 MED ORDER — GABAPENTIN 100 MG PO CAPS
200.0000 mg | ORAL_CAPSULE | Freq: Every day | ORAL | 0 refills | Status: DC
  Filled 2024-03-09: qty 180, 90d supply, fill #0

## 2024-03-09 NOTE — Telephone Encounter (Signed)
 Copied from CRM [id number]. Topic: Clinical - Medication Question >> Mar 09, 2024  3:41 PM Kathryne Eriksson wrote: Reason for CRM: Semaglutide (OZEMPIC, 0.25 OR 0.5 MG/DOSE, [place])

## 2024-03-11 ENCOUNTER — Encounter: Payer: Self-pay | Admitting: Physical Therapy

## 2024-03-11 ENCOUNTER — Ambulatory Visit: Payer: PPO | Admitting: Physical Therapy

## 2024-03-11 DIAGNOSIS — M6281 Muscle weakness (generalized): Secondary | ICD-10-CM

## 2024-03-11 DIAGNOSIS — M542 Cervicalgia: Secondary | ICD-10-CM | POA: Diagnosis not present

## 2024-03-11 DIAGNOSIS — R2689 Other abnormalities of gait and mobility: Secondary | ICD-10-CM

## 2024-03-11 DIAGNOSIS — R2681 Unsteadiness on feet: Secondary | ICD-10-CM
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
## 2024-03-16 ENCOUNTER — Ambulatory Visit: Payer: PPO

## 2024-03-16 DIAGNOSIS — M5459 Other low back pain: Secondary | ICD-10-CM

## 2024-03-16 DIAGNOSIS — M6281 Muscle weakness (generalized): Secondary | ICD-10-CM

## 2024-03-16 DIAGNOSIS — R2681 Unsteadiness on feet: Secondary | ICD-10-CM

## 2024-03-16 DIAGNOSIS — M542 Cervicalgia: Secondary | ICD-10-CM | POA: Diagnosis not present

## 2024-03-16 DIAGNOSIS — R2689 Other abnormalities of gait and mobility: Secondary | ICD-10-CM
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
## 2024-03-18 ENCOUNTER — Encounter: Payer: Self-pay | Admitting: Physical Therapy

## 2024-03-18 ENCOUNTER — Ambulatory Visit: Payer: PPO | Admitting: Physical Therapy

## 2024-03-18 DIAGNOSIS — M6281 Muscle weakness (generalized): Secondary | ICD-10-CM

## 2024-03-18 DIAGNOSIS — M542 Cervicalgia: Secondary | ICD-10-CM | POA: Diagnosis not present

## 2024-03-18 DIAGNOSIS — R2689 Other abnormalities of gait and mobility: Secondary | ICD-10-CM

## 2024-03-18 DIAGNOSIS — R2681 Unsteadiness on feet: Secondary | ICD-10-CM
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
## 2024-03-29 ENCOUNTER — Encounter (HOSPITAL_BASED_OUTPATIENT_CLINIC_OR_DEPARTMENT_OTHER): Payer: Self-pay | Admitting: Physical Therapy

## 2024-03-29 ENCOUNTER — Ambulatory Visit (HOSPITAL_BASED_OUTPATIENT_CLINIC_OR_DEPARTMENT_OTHER): Attending: Family Medicine | Admitting: Physical Therapy

## 2024-03-29 DIAGNOSIS — M6281 Muscle weakness (generalized): Secondary | ICD-10-CM | POA: Diagnosis present

## 2024-03-29 DIAGNOSIS — M542 Cervicalgia: Secondary | ICD-10-CM | POA: Diagnosis present

## 2024-03-29 DIAGNOSIS — R2689 Other abnormalities of gait and mobility: Secondary | ICD-10-CM | POA: Insufficient documentation

## 2024-03-29 DIAGNOSIS — M5459 Other low back pain: Secondary | ICD-10-CM | POA: Insufficient documentation

## 2024-03-29 DIAGNOSIS — R2681 Unsteadiness on feet: Secondary | ICD-10-CM | POA: Insufficient documentation
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
## 2024-03-31 ENCOUNTER — Other Ambulatory Visit: Payer: Self-pay | Admitting: Neurology

## 2024-03-31 ENCOUNTER — Other Ambulatory Visit (HOSPITAL_COMMUNITY): Payer: Self-pay

## 2024-03-31 ENCOUNTER — Other Ambulatory Visit: Payer: Self-pay

## 2024-03-31 ENCOUNTER — Encounter: Payer: Self-pay | Admitting: Family Medicine

## 2024-03-31 ENCOUNTER — Other Ambulatory Visit: Payer: Self-pay | Admitting: Family Medicine

## 2024-03-31 DIAGNOSIS — K219 Gastro-esophageal reflux disease without esophagitis: Secondary | ICD-10-CM

## 2024-03-31 MED ORDER — TOPIRAMATE 50 MG PO TABS
50.0000 mg | ORAL_TABLET | Freq: Two times a day (BID) | ORAL | 0 refills | Status: DC
  Filled 2024-03-31: qty 200, 100d supply, fill #0

## 2024-03-31 MED ORDER — NITROGLYCERIN 0.4 MG SL SUBL
SUBLINGUAL_TABLET | SUBLINGUAL | 1 refills | Status: AC
  Filled 2024-03-31: qty 25, 10d supply, fill #0
  Filled 2024-09-24: qty 25, 10d supply, fill #1

## 2024-03-31 MED ORDER — OMEPRAZOLE 40 MG PO CPDR
40.0000 mg | DELAYED_RELEASE_CAPSULE | Freq: Two times a day (BID) | ORAL | 1 refills | Status: DC
  Filled 2024-03-31: qty 180, 90d supply, fill #0
  Filled 2024-07-09: qty 180, 90d supply, fill #1

## 2024-04-01 ENCOUNTER — Telehealth: Payer: Self-pay | Admitting: Pharmacist

## 2024-04-01 ENCOUNTER — Other Ambulatory Visit (HOSPITAL_COMMUNITY): Payer: Self-pay

## 2024-04-01 ENCOUNTER — Other Ambulatory Visit: Payer: Self-pay

## 2024-04-01 ENCOUNTER — Other Ambulatory Visit: Payer: Self-pay | Admitting: Pharmacist

## 2024-04-01 MED ORDER — OZEMPIC (0.25 OR 0.5 MG/DOSE) 2 MG/3ML ~~LOC~~ SOPN
0.5000 mg | PEN_INJECTOR | SUBCUTANEOUS | 1 refills | Status: DC
Start: 2024-04-01 — End: 2024-09-16
  Filled 2024-04-01: qty 9, 84d supply, fill #0
  Filled 2024-06-30: qty 9, 84d supply, fill #1

## 2024-04-01 NOTE — Telephone Encounter (Signed)
 Patient was c/o cost of Dulera - $100 per month.  Checked his [employer] formulary for lower cost alternatives  possible switch to lower cost alternative. Provided the information below to pulmonary clinic - [facility] (has to LM on VM of nurse that handles pharmacy cost issues)              Breo - tier 3 $47 / $117.50             Symbicort or generic - non formualry             Advair HFA tier 3 $47 / $117.50             Airsupra - tier 3 $47 / $117.50             Generic Advair - fluticasone / salmeterol - $0

## 2024-04-01 NOTE — Progress Notes (Signed)
 04/01/2024 Name: Garrett [name] MRN: [id number] DOB: [birth year]  Chief Complaint  Patient presents with   Medication Management   Diabetes    Garrett [name] is a 73 y.o. year old male who presented for a telephone visit.   They were referred to the pharmacist by  self  for assistance with medication costs  Medication Access/Adherence  Current Pharmacy:  [facility] [address], [place] - [address] [place] [address] Phone: [phone] Fax: [phone]  [place] [facility] [address] [place] [address] Phone: [phone] Fax: [phone]  [facility] - [place], [place] - [address] [place] [address] Phone: [phone] Fax: [phone]   Patient reports affordability concerns with their medications: Yes  - In 2024 patient was enrolled in Novo Nordisk medication assistance program. He is not currently enrolled for 2025 Patient reports access/transportation concerns to their pharmacy: No  Patient reports adherence concerns with their medications:  Yes  unable to get refill  Garrett [name] has [employer] Heart and Diabetes Care plan. His copay for heart and diabetes medications is $0. Since Medicare ha snot coverage gap for 2025 his coapy for the following medications will be $0 for the full year - Ozempic, atorvastatin, furosemide, metformin.   Xarelto is not included as a heart medication for this plan - patient states that Xarelto and Dulera have high copays.   Diabetes Current regimen:  Metformin 500mg  twice a day with food Ozempic 0.5mg  injected once per week  - Started Ozempic 0.25mg  on  08/26/2021. He has bee receiving from [employer] medication assistance program due to Medicare coverage gap but is not eligible for 2025 because no coverage gap and his copay with his Diabetes and Heart Care plan is $0.  - A1c when started Ozempic was 7.5% -  Starting weight was 224 lbs - Current A1c is 7.0% - Current weight = 192lbs - Current BMI = 192lbs - Total weight loss = 32 lbs  Objective:  Lab Results  Component Value Date   HGBA1C 7.0 (H) 07/29/2023   Lab Results  Component Value Date   CHOL 111 07/02/2023   HDL 29.00 (L) 07/02/2023   LDLCALC 48 07/02/2023   TRIG 172.0 (H) 07/02/2023   CHOLHDL 4 07/02/2023    Lab Results  Component Value Date   WBC 13.7 (H) 08/15/2023   HGB 10.5 (L) 08/15/2023   HCT 33.2 (L) 08/15/2023   MCV 88.8 08/15/2023   PLT 400 08/15/2023     Medications Reviewed Today     Reviewed by Garrett [name], RPH-CPP (Pharmacist) on 04/01/24 at 1516  Med List Status: <None>   Medication Order Taking? Sig Documenting Provider Last Dose Status Informant  albuterol (PROAIR HFA) 108 (90 Base) MCG/ACT inhaler [id number]  Inhale 1 puff into the lungs every 6 (six) hours as needed for wheezing or shortness of breath. Garrett [name]  Active   allopurinol (ZYLOPRIM) 100 MG tablet [id number] Yes Take 1 tablet (100 mg total) by mouth daily. Garrett [name] Taking Active   atorvastatin (LIPITOR) 80 MG tablet [id number] Yes Take 1 tablet (80 mg total) by mouth daily.  Taking Active   celecoxib (CELEBREX) 200 MG capsule [id number] Yes Take 1 capsule (200 mg total) by mouth 2 (two) times daily. Garrett [name] Taking Active   EPINEPHrine 0.3 mg/0.3 mL IJ  SOAJ injection [id number]  Inject 0.3 mg into the muscle as needed. Garrett [name]  Active Self  fenofibrate 160 MG tablet [id number] Yes Take 1 tablet (160 mg total) by mouth daily. Garrett [name] Taking Active   Ferrous Sulfate (IRON PO) [id number] Yes Take 1 tablet by mouth daily. Garrett [name] Taking Active Self  fluticasone (FLONASE) 50 MCG/ACT nasal spray [id number] Yes Place 2 sprays into both nostrils daily. Garrett [name] Taking Active Self  furosemide (LASIX) 40 MG tablet [id number]  Take 1 tablet  (40 mg total) by mouth 2 (two) times daily.   Active   furosemide (LASIX) 40 MG tablet [id number]  Take 1 tablet (40 mg total) by mouth 2 (two) times daily.   Active   gabapentin (NEURONTIN) 100 MG capsule [id number] Yes Take 2 capsules (200 mg total) by mouth at bedtime. Garrett [name] Taking Active   glucose blood [facility] ULTRA) test strip [id number]  USE AS DIRECTED 3 TIMES  DAILY Garrett [name], Garrett [name]  Active Self  HYDROcodone-acetaminophen (NORCO/VICODIN) 5-325 MG tablet [id number] Yes Take 1 tablet by mouth every 6 (six) hours as needed for moderate pain. Garrett Pippin, [name] Taking Active   levocetirizine (XYZAL) 5 MG tablet [id number] Yes Take 1 tablet (5 mg total) by mouth every evening. Garrett [name] Taking Active   memantine (NAMENDA) 10 MG tablet [id number] Yes Take 2 tablets (20 mg total) by mouth daily. Garrett [name] Taking Active   metFORMIN (GLUCOPHAGE-XR) 500 MG 24 hr tablet [id number] Yes Take 1 tablet (500 mg) by mouth daily with breakfast. Garrett [name], Garrett [name] Taking Active   Metoprolol Tartrate 75 MG TABS [id number] Yes Take 1 tablet (75 mg total) by mouth 2 (two) times daily. Garrett [name] Taking Active   Metoprolol Tartrate 75 MG TABS [id number]  Take 1 tablet (75 mg total) by mouth 2 (two) times daily.   Active   mometasone-formoterol (DULERA) 100-5 MCG/ACT AERO [id number] Yes Inhale 1-2 puffs into the lungs 2 (two) times daily.  Taking Active   Multiple Vitamins-Minerals (ONE-A-DAY WEIGHT SMART ADVANCE PO) [id number] Yes Take 1 tablet by mouth daily. Centrum Silver Garrett [name] Taking Active Self  nitroGLYCERIN (NITROSTAT) 0.4 MG SL tablet [id number]  Place 1 tablet under the tongue every 5 minutes as needed for chest pain. Garrett [name], Garrett [name]  Active   omeprazole (PRILOSEC) 40 MG capsule [id number] Yes Take 1 capsule (40 mg total) by mouth 2 (two) times daily shortly before a meal (breakfast and dinner). Garrett [name] Taking Active   potassium chloride SA (KLOR-CON M) 20 MEQ tablet [id number] Yes Take 2 tablets (40 mEq total) by mouth daily. Garrett [name] Taking Active   potassium citrate (UROCIT-K) 10 MEQ (1080 MG) SR tablet [id number] Yes Take 1 tablet (10 mEq total) by mouth 3 (three) times daily.  Taking Active   rivaroxaban (XARELTO) 10 MG TABS tablet [id number] Yes Take 1 tablet (10 mg total) by mouth daily. Garrett [name] Taking Active   Semaglutide (OZEMPIC, 0.25 OR 0.5 MG/DOSE, [place]) [id number] Yes Inject 0.5 mg into the skin once a week. Sunday Garrett [name] Taking Active Self  topiramate (TOPAMAX) 50 MG tablet [id number] Yes Take 1 tablet (50 mg total) by mouth 2 (two) times daily. Van Clines, [name] Taking Active   vitamin B-12 (CYANOCOBALAMIN) 1000 MCG tablet [id number] Yes  Take 1,000 mcg by mouth daily. Garrett [name] Taking Active Self              Assessment/Plan:   Medication Management: - Reviewed 50 Form[employer] for [facility] Heart and Diabetes Care:   Ozempic cost $0  Xarelto - $47 / 30 days or $117.50 for 90 days  Dulera - $100 for 30 days or $200 for 90 days   - Checked on lower cost alternatives to [facility] - will forward information to pulmonary clinic to review for possible switch to lower cost alternative.   Breo - tier 3 $47 / $117.50  Symbicort or generic - non formualry  Advair HFA tier 3 $47 / $117.50  Airsupra - tier 3 $47 / $117.50  Generic Advair - fluticasone / salmeterol - $0  Type 2 DM - Close to A1c goal of < 7.0%; Last A1c was 7.0%-  - Continue Ozempic 0.5mg  weekly and metformin 500mg  twice a day - Limit intake of breads, potatoes, other high CHO foods and avoid sugar containing foods.  - If A1c not at goal with next check, could consider increasing Ozepic to 1mg  weekly   Meds ordered this encounter  Medications   Semaglutide,0.25 or 0.5MG /DOS, (OZEMPIC, 0.25 OR 0.5 MG/DOSE,) 2 MG/3ML SOPN    Sig: Inject 0.5 mg  into the skin once a week. Sunday    Dispense:  9 mL    Refill:  1   Ozempic prior authorization needed - submitted on Cover My Meds  (Key: [id number])  Garrett [name], PharmD Clinical Pharmacist [facility]

## 2024-04-02 ENCOUNTER — Other Ambulatory Visit (HOSPITAL_COMMUNITY): Payer: Self-pay

## 2024-04-02 ENCOUNTER — Telehealth: Payer: Self-pay

## 2024-04-02 NOTE — Telephone Encounter (Signed)
 Pharmacy Patient Advocate Encounter   Received notification from Onbase that prior authorization for Ozempic (0.25 or 0.5 MG/DOSE) 2MG /3ML pen-injectors is required/requested.   Insurance verification completed.   The patient is insured through [facility] ADVANTAGE/RX ADVANCE .   Per test claim: PA required; PA submitted to above mentioned insurance via CoverMyMeds Key/confirmation #/EOC [id number] Status is pending

## 2024-04-05 ENCOUNTER — Other Ambulatory Visit (HOSPITAL_COMMUNITY): Payer: Self-pay

## 2024-04-05 ENCOUNTER — Encounter: Payer: Self-pay | Admitting: Gastroenterology

## 2024-04-05 ENCOUNTER — Ambulatory Visit (INDEPENDENT_AMBULATORY_CARE_PROVIDER_SITE_OTHER): Admitting: Pharmacist

## 2024-04-05 ENCOUNTER — Telehealth

## 2024-04-05 ENCOUNTER — Ambulatory Visit (INDEPENDENT_AMBULATORY_CARE_PROVIDER_SITE_OTHER): Payer: PPO | Admitting: Gastroenterology

## 2024-04-05 VITALS — Ht 69.0 in | Wt 200.4 lb

## 2024-04-05 DIAGNOSIS — K219 Gastro-esophageal reflux disease without esophagitis: Secondary | ICD-10-CM | POA: Insufficient documentation

## 2024-04-05 DIAGNOSIS — R1310 Dysphagia, unspecified: Secondary | ICD-10-CM | POA: Diagnosis not present

## 2024-04-05 DIAGNOSIS — E1165 Type 2 diabetes mellitus with hyperglycemia: Secondary | ICD-10-CM

## 2024-04-05 DIAGNOSIS — Z860100 Personal history of colon polyps, unspecified: Secondary | ICD-10-CM

## 2024-04-05 DIAGNOSIS — R197 Diarrhea, unspecified: Secondary | ICD-10-CM

## 2024-04-05 NOTE — Patient Instructions (Addendum)
 _______________________________________________________  If your blood pressure at your visit was 140/90 or greater, please contact your primary care physician to follow up on this.  _______________________________________________________  If you are age 74 or older, your body mass index should be between 23-30. Your Body mass index is 29.59 kg/m. If this is out of the aforementioned range listed, please consider follow up with your Primary Care Provider.  If you are age 6 or younger, your body mass index should be between 19-25. Your Body mass index is 29.59 kg/m. If this is out of the aformentioned range listed, please consider follow up with your Primary Care Provider.   ________________________________________________________  The [facility] providers would like to encourage you to use MYCHART to communicate with providers for non-urgent requests or questions.  Due to long hold times on the telephone, sending your provider a message by [facility] may be a faster and more efficient way to get a response.  Please allow 48 business hours for a response.  Please remember that this is for non-urgent requests.  _______________________________________________________  Garrett [name] have been scheduled for an Upper GI Series at [facility]. Your appointment is on 04-15-24 at 9am. Please arrive 30 minutes prior to your test for registration. Make sure not to eat or drink anything after midnight on the night before your test. If you need to reschedule, please call radiology at [phone]. ________________________________________________________________ An upper GI series uses x rays to help diagnose problems of the upper GI tract, which includes the esophagus, stomach, and duodenum. The duodenum is the first part of the small intestine. An upper GI series is conducted by a radiology technologist or a radiologist--a doctor who specializes in x-ray imaging--at a hospital or outpatient center. While  sitting or standing in front of an x-ray machine, the patient drinks barium liquid, which is often white and has a chalky consistency and taste. The barium liquid coats the lining of the upper GI tract and makes signs of disease show up more clearly on x rays. X-ray video, called fluoroscopy, is used to view the barium liquid moving through the esophagus, stomach, and duodenum. Additional x rays and fluoroscopy are performed while the patient lies on an x-ray table. To fully coat the upper GI tract with barium liquid, the technologist or radiologist may press on the abdomen or ask the patient to change position. Patients hold still in various positions, allowing the technologist or radiologist to take x rays of the upper GI tract at different angles. If a technologist conducts the upper GI series, a radiologist will later examine the images to look for problems.  This test typically takes about 1 hour to complete. __________________________________________________________________  Garrett [name] will be contacted by our office prior to your procedure for directions on holding your blood thinner.  If you do not hear from our office 2 week prior to your scheduled procedure, please call [phone] to discuss.   You have been scheduled for an endoscopy. Please follow written instructions given to you at your visit today.  If you use inhalers (even only as needed), please bring them with you on the day of your procedure.  If you take any of the following medications, they will need to be adjusted prior to your procedure:   DO NOT TAKE 7 DAYS PRIOR TO TEST- Trulicity (dulaglutide) Ozempic, Wegovy (semaglutide) Mounjaro (tirzepatide) Bydureon Bcise (exanatide extended release)  DO NOT TAKE 1 DAY PRIOR TO YOUR TEST Rybelsus (semaglutide) Adlyxin (lixisenatide) Victoza (liraglutide) Byetta (exanatide) ___________________________________________________________________________  Garrett [name]  you,  Dr. Lajuan Pila

## 2024-04-05 NOTE — Telephone Encounter (Signed)
 Pharmacy Patient Advocate Encounter  Received notification from [facility] ADVANTAGE/RX ADVANCE that Prior Authorization for Ozempic (0.25 or 0.5 MG/DOSE) 2MG /3ML pen-injectors has been APPROVED from 04/01/24 to 04/01/25. Ran test claim, Copay is $0. This test claim was processed through [facility]- copay amounts may vary at other pharmacies due to pharmacy/plan contracts, or as the patient moves through the different stages of their insurance plan.   PA #/Case ID/Reference #: [id number]

## 2024-04-05 NOTE — Progress Notes (Signed)
 04/05/2024 Name: STEPHANIE MCGLONE MRN: [id number] DOB: July 01, 1951  Chief Complaint  Patient presents with   Medication Management    Garrett [name] is a 73 y.o. year old male who presented for a telephone visit.   They were referred to the pharmacist by  self  for assistance with medication costs  Medication Access/Adherence  Current Pharmacy:  [facility] [address] [place] - [address] [place]  [address] Phone: [phone] Fax: [phone]  [place] [facility] [address] [place] [address] Phone: [phone] Fax: [phone]  [facility] - [place], [place] - [address] [place] [address] Phone: [phone] Fax: [phone]   Patient reports affordability concerns with their medications: Yes  - In 2024 patient was enrolled in Novo Nordisk medication assistance program. He is not currently enrolled for 2025 Patient reports access/transportation concerns to their pharmacy: No  Patient reports adherence concerns with their medications:  Yes  unable to get refill  Garrett [name] has [employer] Heart and Diabetes Care plan. His copay for heart and diabetes medications is $0. Since Medicare has no coverage gap for 2025 his coapy for the following medications will be $0 for the full year - Ozempic, atorvastatin, furosemide, metformin.   His Ozempic did need prior authorization and was submitted last week.   Xarelto is not included as a heart medication for this plan - patient states that Xarelto and Dulera have high copays.   Diabetes Current regimen:  Metformin 500mg  twice a day with food Ozempic 0.5mg  injected once per week  - Started Ozempic 0.25mg  on  08/26/2021. He has bee receiving from [employer] medication assistance program due to Medicare coverage gap but is not eligible for 2025 because no coverage gap and his copay with his Diabetes and  Heart Care plan is $0.  - A1c when started Ozempic was 7.5% - Starting weight was 224 lbs - Current A1c is 7.0% - Current weight = 192lbs - Current BMI = 192lbs - Total weight loss = 32 lbs  Objective:  Lab Results  Component Value Date   HGBA1C 7.0 (H) 07/29/2023   Lab Results  Component Value Date   CHOL 111 07/02/2023   HDL 29.00 (L) 07/02/2023   LDLCALC 48 07/02/2023   TRIG 172.0 (H) 07/02/2023   CHOLHDL 4 07/02/2023    Lab Results  Component Value Date   WBC 13.7 (H) 08/15/2023   HGB 10.5 (L) 08/15/2023   HCT 33.2 (L) 08/15/2023   MCV 88.8 08/15/2023   PLT 400 08/15/2023     Medications Reviewed Today     Reviewed by Henrene Pastor, RPH-CPP (Pharmacist) on 04/05/24 at 1332  Med List Status: <None>   Medication Order Taking? Sig Documenting Provider Last Dose Status Informant  albuterol (PROAIR HFA) 108 (90 Base) MCG/ACT inhaler [id number] No Inhale 1 puff into the lungs every 6 (six) hours as needed for wheezing or shortness of breath. Donato Schultz, DO Taking Active   allopurinol (ZYLOPRIM) 100 MG tablet [id number] No Take 1 tablet (100 mg total) by mouth daily. Donato Schultz, DO Taking Active   atorvastatin (LIPITOR) 80 MG tablet [id number] No Take 1 tablet (80 mg total) by mouth daily.  Taking Active   celecoxib (CELEBREX) 200 MG capsule [id number] No Take 1 capsule (200 mg total) by mouth 2 (two) times daily. Zola Button, Grayling Congress,  DO Taking Active   EPINEPHrine 0.3 mg/0.3 mL IJ SOAJ injection [id number] No Inject 0.3 mg into the muscle as needed. [provider] Taking Active Self  fenofibrate 160 MG tablet [id number] No Take 1 tablet (160 mg total) by mouth daily. Seabron Spates R, DO Taking Active   Ferrous Sulfate (IRON PO) [id number] No Take 1 tablet by mouth daily. [provider] Taking Active Self  fluticasone (FLONASE) 50 MCG/ACT nasal spray [id number] No Place 2 sprays into both nostrils daily. Donato Schultz, DO  Taking Active Self  furosemide (LASIX) 40 MG tablet [id number] No Take 1 tablet (40 mg total) by mouth 2 (two) times daily.  Taking Active   furosemide (LASIX) 40 MG tablet [id number] No Take 1 tablet (40 mg total) by mouth 2 (two) times daily.  Taking Active   gabapentin (NEURONTIN) 100 MG capsule [id number] No Take 2 capsules (200 mg total) by mouth at bedtime. Seabron Spates R, DO Taking Active   glucose blood [facility] ULTRA) test strip [id number] No USE AS DIRECTED 3 TIMES  DAILY Zola Button, Grayling Congress, DO Taking Active Self  HYDROcodone-acetaminophen (NORCO/VICODIN) 5-325 MG tablet [id number] No Take 1 tablet by mouth every 6 (six) hours as needed for moderate pain. Bjorn Pippin, MD Taking Active   levocetirizine (XYZAL) 5 MG tablet [id number] No Take 1 tablet (5 mg total) by mouth every evening. Donato Schultz, DO Taking Active   memantine (NAMENDA) 10 MG tablet [id number] No Take 2 tablets (20 mg total) by mouth daily. Donato Schultz, DO Taking Active   metFORMIN (GLUCOPHAGE-XR) 500 MG 24 hr tablet [id number] No Take 1 tablet (500 mg) by mouth daily with breakfast. Zola Button, Grayling Congress, DO Taking Active   Metoprolol Tartrate 75 MG TABS [id number] No Take 1 tablet (75 mg total) by mouth 2 (two) times daily. Seabron Spates R, DO Taking Active   Metoprolol Tartrate 75 MG TABS [id number] No Take 1 tablet (75 mg total) by mouth 2 (two) times daily.  Patient not taking: Reported on 04/01/2024    Not Taking Active   mometasone-formoterol (DULERA) 100-5 MCG/ACT AERO [id number] No Inhale 1-2 puffs into the lungs 2 (two) times daily.  Taking Active   Multiple Vitamins-Minerals (ONE-A-DAY WEIGHT SMART ADVANCE PO) [id number] No Take 1 tablet by mouth daily. Centrum Silver [provider] Taking Active Self  nitroGLYCERIN (NITROSTAT) 0.4 MG SL tablet [id number]  Place 1 tablet under the tongue every 5 minutes as needed for chest pain. Zola Button, Grayling Congress, DO  Active   omeprazole  (PRILOSEC) 40 MG capsule [id number] No Take 1 capsule (40 mg total) by mouth 2 (two) times daily shortly before a meal (breakfast and dinner). Donato Schultz, DO Taking Active   potassium chloride SA (KLOR-CON M) 20 MEQ tablet [id number] No Take 2 tablets (40 mEq total) by mouth daily. Donato Schultz, DO Taking Active   potassium citrate (UROCIT-K) 10 MEQ (1080 MG) SR tablet [id number] No Take 1 tablet (10 mEq total) by mouth 3 (three) times daily.  Taking Active   rivaroxaban (XARELTO) 10 MG TABS tablet [id number] No Take 1 tablet (10 mg total) by mouth daily. Donato Schultz, DO Taking Active   Semaglutide,0.25 or 0.5MG /DOS, (OZEMPIC, 0.25 OR 0.5 MG/DOSE,) 2 MG/3ML SOPN [id number]  Inject 0.5 mg into the skin once a week. Sunday Zola Button, Myrene Buddy R, DO  Active   topiramate (TOPAMAX) 50 MG tablet [id number] No Take 1  tablet (50 mg total) by mouth 2 (two) times daily. Jhonny Moss, MD Taking Active   vitamin B-12 (CYANOCOBALAMIN) 1000 MCG tablet [id number] No Take 1,000 mcg by mouth daily. [provider] Taking Active Self              Assessment/Plan:   Medication Management  - Last week left message at his pulmonology clinic for possible switch to lower cost alternative. Patient reports he has not heard from pulmonology office yet. Will forward list of medications to patient.   Breo - tier 3 $47 / $117.50  Symbicort or generic - non formualry  Advair HFA tier 3 $47 / $117.50  Airsupra - tier 3 $47 / $117.50  Generic Advair - fluticasone / salmeterol - $0  Type 2 DM - Close to A1c goal of < 7.0%; Last A1c was 7.0%-  - Continue Ozempic 0.5mg  weekly and metformin 500mg  twice a day - Limit intake of breads, potatoes, other high CHO foods and avoid sugar containing foods.  - If A1c not at goal with next check, could consider increasing Ozempic to 1mg  weekly   Ozempic prior authorization approved- submitted on Cover My Meds  (Key: [id number])- approved; Notified  pharmacy - [facility] - they ran and cost was $0   Cecilie Coffee, PharmD Clinical Therapist, [facility]
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
## 2024-04-05 NOTE — Addendum Note (Signed)
 Addended by: Lajuan Pila on: 04/05/2024 05:19 PM   Modules accepted: Level of Service

## 2024-04-06 ENCOUNTER — Other Ambulatory Visit: Payer: Self-pay

## 2024-04-06 ENCOUNTER — Telehealth: Payer: Self-pay

## 2024-04-06 ENCOUNTER — Other Ambulatory Visit (HOSPITAL_COMMUNITY): Payer: Self-pay

## 2024-04-06 NOTE — Telephone Encounter (Signed)
 Dr Crecencio Dodge,  This mutual patient of ours is having EGD with Dr Venice Gillis on 07-19-24 and we are asking if you will clear this patient to hold is Xarelto 2 days prior to his procedure which is scheduled for 07-19-24? Please let us  know as soon as you can. Thank you         [facility] Pre-operative Risk Assessment     Request for surgical clearance:     Endoscopy Procedure  What type of surgery is being performed?     EGD  When is this surgery scheduled?     07-19-24  What type of clearance is required ?   Pharmacy  Are there any medications that need to be held prior to surgery and how long? Xarelto 2 day hold   Practice name and name of physician performing surgery?      [facility]  What is your office phone and fax number?      Phone- [phone]  Fax- [phone]  Anesthesia type (None, local, MAC, general) ?       MAC   Please route your response to [facility])

## 2024-04-07 ENCOUNTER — Telehealth: Payer: Self-pay

## 2024-04-07 NOTE — Telephone Encounter (Signed)
 Faxed clearance to [facility]

## 2024-04-07 NOTE — Telephone Encounter (Signed)
 Diabetic shoe request form from [facility] signed and faxed back with office notes.

## 2024-04-07 NOTE — Telephone Encounter (Signed)
 I'm sorry. I'm confused when you say patient see cardiology. Does that mean you are saying that the clearance needs to go out to his cardiologist at [facility]?

## 2024-04-08 ENCOUNTER — Encounter (HOSPITAL_BASED_OUTPATIENT_CLINIC_OR_DEPARTMENT_OTHER): Payer: Self-pay | Admitting: Physical Therapy

## 2024-04-08 ENCOUNTER — Ambulatory Visit (HOSPITAL_BASED_OUTPATIENT_CLINIC_OR_DEPARTMENT_OTHER): Admitting: Physical Therapy

## 2024-04-08 DIAGNOSIS — R2689 Other abnormalities of gait and mobility: Secondary | ICD-10-CM

## 2024-04-08 DIAGNOSIS — R2681 Unsteadiness on feet: Secondary | ICD-10-CM

## 2024-04-08 DIAGNOSIS — M6281 Muscle weakness (generalized): Secondary | ICD-10-CM | POA: Diagnosis not present
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
## 2024-04-13 ENCOUNTER — Ambulatory Visit (HOSPITAL_BASED_OUTPATIENT_CLINIC_OR_DEPARTMENT_OTHER): Admitting: Physical Therapy

## 2024-04-13 ENCOUNTER — Encounter (HOSPITAL_BASED_OUTPATIENT_CLINIC_OR_DEPARTMENT_OTHER): Payer: Self-pay | Admitting: Physical Therapy

## 2024-04-13 ENCOUNTER — Other Ambulatory Visit (HOSPITAL_COMMUNITY): Payer: Self-pay

## 2024-04-13 DIAGNOSIS — M6281 Muscle weakness (generalized): Secondary | ICD-10-CM

## 2024-04-13 DIAGNOSIS — R2689 Other abnormalities of gait and mobility: Secondary | ICD-10-CM

## 2024-04-13 DIAGNOSIS — M542 Cervicalgia: Secondary | ICD-10-CM

## 2024-04-13 DIAGNOSIS — M5459 Other low back pain: Secondary | ICD-10-CM

## 2024-04-13 DIAGNOSIS — R2681 Unsteadiness on feet: Secondary | ICD-10-CM
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
## 2024-04-15 ENCOUNTER — Ambulatory Visit (HOSPITAL_BASED_OUTPATIENT_CLINIC_OR_DEPARTMENT_OTHER): Admitting: Physical Therapy

## 2024-04-15 ENCOUNTER — Ambulatory Visit (HOSPITAL_COMMUNITY)
Admission: RE | Admit: 2024-04-15 | Discharge: 2024-04-15 | Disposition: A | Discharge status: Home / Self Care | Source: Ambulatory Visit | Attending: Gastroenterology | Admitting: Gastroenterology

## 2024-04-15 DIAGNOSIS — K219 Gastro-esophageal reflux disease without esophagitis: Secondary | ICD-10-CM | POA: Insufficient documentation

## 2024-04-15 DIAGNOSIS — R1310 Dysphagia, unspecified: Secondary | ICD-10-CM | POA: Diagnosis present

## 2024-04-16 ENCOUNTER — Ambulatory Visit (HOSPITAL_BASED_OUTPATIENT_CLINIC_OR_DEPARTMENT_OTHER): Admitting: Physical Therapy

## 2024-04-16 ENCOUNTER — Encounter (HOSPITAL_BASED_OUTPATIENT_CLINIC_OR_DEPARTMENT_OTHER): Payer: Self-pay | Admitting: Physical Therapy

## 2024-04-16 DIAGNOSIS — R2689 Other abnormalities of gait and mobility: Secondary | ICD-10-CM

## 2024-04-16 DIAGNOSIS — R2681 Unsteadiness on feet: Secondary | ICD-10-CM

## 2024-04-16 DIAGNOSIS — M6281 Muscle weakness (generalized): Secondary | ICD-10-CM | POA: Diagnosis not present

## 2024-04-16 NOTE — Telephone Encounter (Signed)
 Spoke to [employer] at the [employer] office yesterday. She directed me to [place] and they told me to fax clearance to [phone] and I did phone number [phone]

## 2024-04-16 NOTE — Telephone Encounter (Signed)
 Patient made aware to hold his xarelto  2 days prior to his procedure in July and he voiced understanding. Paper sent to be scanned
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
## 2024-04-20 ENCOUNTER — Ambulatory Visit (HOSPITAL_BASED_OUTPATIENT_CLINIC_OR_DEPARTMENT_OTHER): Admitting: Physical Therapy

## 2024-04-22 ENCOUNTER — Ambulatory Visit (HOSPITAL_BASED_OUTPATIENT_CLINIC_OR_DEPARTMENT_OTHER): Admitting: Physical Therapy

## 2024-04-27 ENCOUNTER — Ambulatory Visit (HOSPITAL_BASED_OUTPATIENT_CLINIC_OR_DEPARTMENT_OTHER): Admitting: Physical Therapy

## 2024-04-29 ENCOUNTER — Ambulatory Visit (HOSPITAL_BASED_OUTPATIENT_CLINIC_OR_DEPARTMENT_OTHER): Admitting: Physical Therapy

## 2024-04-30 ENCOUNTER — Other Ambulatory Visit: Payer: Self-pay

## 2024-04-30 ENCOUNTER — Other Ambulatory Visit (HOSPITAL_COMMUNITY): Payer: Self-pay

## 2024-04-30 ENCOUNTER — Other Ambulatory Visit: Payer: Self-pay | Admitting: Family Medicine

## 2024-04-30 DIAGNOSIS — R059 Cough, unspecified: Secondary | ICD-10-CM

## 2024-04-30 DIAGNOSIS — I10 Essential (primary) hypertension: Secondary | ICD-10-CM

## 2024-04-30 DIAGNOSIS — M1A10X0 Lead-induced chronic gout, unspecified site, without tophus (tophi): Secondary | ICD-10-CM

## 2024-04-30 MED ORDER — FUROSEMIDE 40 MG PO TABS
40.0000 mg | ORAL_TABLET | Freq: Two times a day (BID) | ORAL | 0 refills | Status: DC
  Filled 2024-05-27: qty 180, 90d supply, fill #0

## 2024-04-30 MED ORDER — ATORVASTATIN CALCIUM 80 MG PO TABS
80.0000 mg | ORAL_TABLET | Freq: Every day | ORAL | 0 refills | Status: DC
  Filled 2024-08-16: qty 90, 90d supply, fill #0

## 2024-04-30 MED ORDER — POTASSIUM CHLORIDE CRYS ER 20 MEQ PO TBCR
40.0000 meq | EXTENDED_RELEASE_TABLET | Freq: Every day | ORAL | 1 refills | Status: DC
Start: 2024-04-30 — End: 2024-10-25
  Filled 2024-04-30: qty 180, 90d supply, fill #0
  Filled 2024-08-19: qty 180, 90d supply, fill #1

## 2024-04-30 MED ORDER — ALLOPURINOL 100 MG PO TABS
100.0000 mg | ORAL_TABLET | Freq: Every day | ORAL | 0 refills | Status: DC
  Filled 2024-04-30: qty 90, 90d supply, fill #0

## 2024-04-30 MED ORDER — LEVOCETIRIZINE DIHYDROCHLORIDE 5 MG PO TABS
5.0000 mg | ORAL_TABLET | Freq: Every evening | ORAL | 1 refills | Status: DC
  Filled 2024-04-30: qty 90, 90d supply, fill #0
  Filled 2024-08-19: qty 90, 90d supply, fill #1

## 2024-05-03 ENCOUNTER — Other Ambulatory Visit (HOSPITAL_COMMUNITY): Payer: Self-pay

## 2024-05-04 ENCOUNTER — Encounter (HOSPITAL_BASED_OUTPATIENT_CLINIC_OR_DEPARTMENT_OTHER): Payer: Self-pay

## 2024-05-04 ENCOUNTER — Ambulatory Visit (HOSPITAL_BASED_OUTPATIENT_CLINIC_OR_DEPARTMENT_OTHER): Attending: Family Medicine | Admitting: Physical Therapy

## 2024-05-06 ENCOUNTER — Ambulatory Visit: Attending: Orthopedic Surgery

## 2024-05-06 ENCOUNTER — Other Ambulatory Visit: Payer: Self-pay

## 2024-05-06 DIAGNOSIS — M6281 Muscle weakness (generalized): Secondary | ICD-10-CM | POA: Diagnosis present

## 2024-05-06 DIAGNOSIS — R2681 Unsteadiness on feet: Secondary | ICD-10-CM | POA: Diagnosis present

## 2024-05-06 DIAGNOSIS — R2689 Other abnormalities of gait and mobility: Secondary | ICD-10-CM | POA: Diagnosis present
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
## 2024-05-06 NOTE — Therapy (Signed)
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 improved functional ability.  Baseline: 17/50 Goal status: MET 03/02/24 3/50  4.  Patient will demonstrate improved functional strength as demonstrated by 5x STS <25 seconds.   Baseline: 39 seconds with bil UE assist.  Goal status: IN PROGRESS- 02/05/24 29.65 sec with bil UE support  03/18/24- 18 seconds with bil UE support 05/06/24: 17.2 sec  improved  5. Patient will be able to stand without support for 30 seconds.    Baseline: 5 seconds.  Goal status: IN PROGRESS - 03/18/24- 10 seconds 05/06/24: 4 sec  declined since last assessment  6. Patient will score >35/56 on BERG to begin using cane again safely.   Baseline: NT, not safe with cane Goal status: IN PROGRESS  03/18/24 - 21/56  05/06/24: declined since last assessment 16/56    PLAN:  PT FREQUENCY: 1-2x/week  PT DURATION: 6 weeks  PLANNED INTERVENTIONS: 97164- PT Re-evaluation, 97110-Therapeutic exercises, 97530- Therapeutic  activity, 97112- Neuromuscular re-education, 97535- Self Care, [id number]- Manual therapy, [phone]- Gait training, [phone]- Aquatic Therapy, [phone]- Ultrasound, Patient/Family education, Balance training, Stair training, Taping, Dry Needling, Joint mobilization,  Joint manipulation, Spinal manipulation, Spinal mobilization, Vestibular training, Cryotherapy, and Moist heat  PLAN FOR NEXT SESSION:  land alternating with aquatic therapy to continue to work on balance and LE strengthening   Crescentia Boutwell Era Hasty, PT, DPT Board-certified Specialist in Orthopaedic Physical Therapy  05/06/24

## 2024-05-12 ENCOUNTER — Other Ambulatory Visit (HOSPITAL_COMMUNITY): Payer: Self-pay

## 2024-05-12 ENCOUNTER — Other Ambulatory Visit: Payer: Self-pay

## 2024-05-12 MED ORDER — TADALAFIL 20 MG PO TABS
20.0000 mg | ORAL_TABLET | Freq: Every day | ORAL | 99 refills | Status: AC | PRN
  Filled 2024-05-12: qty 30, 30d supply, fill #0

## 2024-05-12 MED ORDER — SILDENAFIL CITRATE 100 MG PO TABS
100.0000 mg | ORAL_TABLET | Freq: Every day | ORAL | 99 refills | Status: AC | PRN
  Filled 2024-05-12: qty 30, 30d supply, fill #0

## 2024-05-12 MED ORDER — POTASSIUM CITRATE ER 10 MEQ (1080 MG) PO TBCR
10.0000 meq | EXTENDED_RELEASE_TABLET | Freq: Three times a day (TID) | ORAL | 3 refills | Status: AC
  Filled 2024-05-12 – 2024-08-24 (×3): qty 270, 90d supply, fill #0
  Filled 2024-12-06: qty 270, 90d supply, fill #1

## 2024-05-13 ENCOUNTER — Other Ambulatory Visit (HOSPITAL_COMMUNITY): Payer: Self-pay

## 2024-05-13 ENCOUNTER — Other Ambulatory Visit: Payer: Self-pay

## 2024-05-19 ENCOUNTER — Other Ambulatory Visit (HOSPITAL_COMMUNITY): Payer: Self-pay

## 2024-05-19 ENCOUNTER — Telehealth: Payer: Self-pay | Admitting: *Deleted

## 2024-05-19 NOTE — Telephone Encounter (Signed)
 Received fax from [facility] to complete for therapeutic shoes. Pt has OV with PCP on 05/24/24 to discuss.  Form placed in PCP red folder.

## 2024-05-24 ENCOUNTER — Encounter: Payer: Self-pay | Admitting: Family Medicine

## 2024-05-24 ENCOUNTER — Ambulatory Visit (INDEPENDENT_AMBULATORY_CARE_PROVIDER_SITE_OTHER): Admitting: Family Medicine

## 2024-05-24 VITALS — BP 129/86 | HR 80 | Temp 97.8°F | Ht 69.0 in | Wt 202.0 lb

## 2024-05-24 DIAGNOSIS — E1165 Type 2 diabetes mellitus with hyperglycemia: Secondary | ICD-10-CM | POA: Diagnosis not present

## 2024-05-24 DIAGNOSIS — E1169 Type 2 diabetes mellitus with other specified complication: Secondary | ICD-10-CM | POA: Diagnosis not present

## 2024-05-24 DIAGNOSIS — E785 Hyperlipidemia, unspecified: Secondary | ICD-10-CM

## 2024-05-24 DIAGNOSIS — E1122 Type 2 diabetes mellitus with diabetic chronic kidney disease: Secondary | ICD-10-CM | POA: Diagnosis not present

## 2024-05-24 DIAGNOSIS — K7689 Other specified diseases of liver: Secondary | ICD-10-CM | POA: Diagnosis not present

## 2024-05-24 DIAGNOSIS — Z7984 Long term (current) use of oral hypoglycemic drugs: Secondary | ICD-10-CM

## 2024-05-24 DIAGNOSIS — N182 Chronic kidney disease, stage 2 (mild): Secondary | ICD-10-CM

## 2024-05-24 DIAGNOSIS — I129 Hypertensive chronic kidney disease with stage 1 through stage 4 chronic kidney disease, or unspecified chronic kidney disease: Secondary | ICD-10-CM

## 2024-05-24 MED ORDER — METFORMIN HCL ER 500 MG PO TB24: ORAL_TABLET | ORAL | 1 refills | Status: DC

## 2024-05-24 NOTE — Assessment & Plan Note (Signed)
 Well controlled, no changes to meds. Encouraged heart healthy diet such as the DASH diet and exercise as tolerated.
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
## 2024-05-24 NOTE — Assessment & Plan Note (Signed)
 Encourage heart healthy diet such as MIND or DASH diet, increase exercise, avoid trans fats, simple carbohydrates and processed foods, consider a krill or fish or flaxseed oil cap daily.

## 2024-05-25 ENCOUNTER — Ambulatory Visit: Attending: Orthopedic Surgery | Admitting: Physical Therapy

## 2024-05-25 ENCOUNTER — Ambulatory Visit (HOSPITAL_BASED_OUTPATIENT_CLINIC_OR_DEPARTMENT_OTHER)
Admission: RE | Admit: 2024-05-25 | Discharge: 2024-05-25 | Disposition: A | Discharge status: Home / Self Care | Source: Ambulatory Visit | Attending: Family Medicine | Admitting: Family Medicine

## 2024-05-25 ENCOUNTER — Other Ambulatory Visit: Payer: Self-pay

## 2024-05-25 DIAGNOSIS — R2681 Unsteadiness on feet: Secondary | ICD-10-CM | POA: Insufficient documentation

## 2024-05-25 DIAGNOSIS — M6281 Muscle weakness (generalized): Secondary | ICD-10-CM | POA: Diagnosis present

## 2024-05-25 DIAGNOSIS — K7689 Other specified diseases of liver: Secondary | ICD-10-CM | POA: Diagnosis present

## 2024-05-25 DIAGNOSIS — M542 Cervicalgia: Secondary | ICD-10-CM | POA: Insufficient documentation

## 2024-05-25 DIAGNOSIS — R2689 Other abnormalities of gait and mobility: Secondary | ICD-10-CM | POA: Insufficient documentation

## 2024-05-25 LAB — COMPREHENSIVE METABOLIC PANEL WITH GFR
ALT: 36 U/L (ref 0–53)
AST: 31 U/L (ref 0–37)
Albumin: 4.3 g/dL (ref 3.5–5.2)
Alkaline Phosphatase: 43 U/L (ref 39–117)
BUN: 31 mg/dL — ABNORMAL HIGH (ref 6–23)
CO2: 24 meq/L (ref 19–32)
Calcium: 9.6 mg/dL (ref 8.4–10.5)
Chloride: 103 meq/L (ref 96–112)
Creatinine, Ser: 2.05 mg/dL — ABNORMAL HIGH (ref 0.40–1.50)
GFR: 31.42 mL/min — ABNORMAL LOW (ref >60.00)
Glucose, Bld: 119 mg/dL — ABNORMAL HIGH (ref 70–99)
Potassium: 3.9 meq/L (ref 3.5–5.1)
Sodium: 139 meq/L (ref 135–145)
Total Bilirubin: 0.5 mg/dL (ref 0.2–1.2)
Total Protein: 7 g/dL (ref 6.0–8.3)

## 2024-05-25 LAB — CBC WITH DIFFERENTIAL/PLATELET
Basophils Absolute: 0 10*3/uL (ref 0.0–0.1)
Basophils Relative: 0.4 % (ref 0.0–3.0)
Eosinophils Absolute: 0.2 10*3/uL (ref 0.0–0.7)
Eosinophils Relative: 2.2 % (ref 0.0–5.0)
HCT: 37.4 % — ABNORMAL LOW (ref 39.0–52.0)
Hemoglobin: 12.4 g/dL — ABNORMAL LOW (ref 13.0–17.0)
Lymphocytes Relative: 20.3 % (ref 12.0–46.0)
Lymphs Abs: 1.6 10*3/uL (ref 0.7–4.0)
MCHC: 33.1 g/dL (ref 30.0–36.0)
MCV: 88.6 fl (ref 78.0–100.0)
Monocytes Absolute: 0.7 10*3/uL (ref 0.1–1.0)
Monocytes Relative: 8.9 % (ref 3.0–12.0)
Neutro Abs: 5.3 10*3/uL (ref 1.4–7.7)
Neutrophils Relative %: 68.2 % (ref 43.0–77.0)
Platelets: 304 10*3/uL (ref 150.0–400.0)
RBC: 4.22 Mil/uL (ref 4.22–5.81)
RDW: 16.6 % — ABNORMAL HIGH (ref 11.5–15.5)
WBC: 7.7 10*3/uL (ref 4.0–10.5)

## 2024-05-25 LAB — LIPID PANEL
Cholesterol: 132 mg/dL (ref 0–200)
HDL: 35 mg/dL — ABNORMAL LOW (ref >39.00)
LDL Cholesterol: 55 mg/dL (ref 0–99)
NonHDL: 97.46
Total CHOL/HDL Ratio: 4
Triglycerides: 210 mg/dL — ABNORMAL HIGH (ref 0.0–149.0)
VLDL: 42 mg/dL — ABNORMAL HIGH (ref 0.0–40.0)

## 2024-05-25 LAB — HEMOGLOBIN A1C: Hgb A1c MFr Bld: 6.9 % — ABNORMAL HIGH (ref 4.6–6.5)

## 2024-05-25 NOTE — Therapy (Signed)
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 therapy.  05/06/24: progressing  2.  Patient will report 75% improvement in neck pain to improve QOL.  Baseline: 6/10 when moves neck Goal status: MET - 03/02/24 - neck pain has resolved  3.  Patient will report at least 8 points improvement on NDI to demonstrate improved functional ability.  Baseline: 17/50 Goal status: MET - 03/02/24 - 3/50  4.  Patient will demonstrate improved functional strength as demonstrated by 5x STS <25 seconds.   Baseline: 39 seconds with bil UE assist.  02/05/24: 29.65 sec with bil UE support   03/18/24: 18 seconds with bil UE support 05/06/24: 17.2 sec  improved Goal status: MET - 03/18/24 & 05/06/24  5. Patient will be able to stand without support for 30 seconds.    Baseline: 5 seconds.  03/18/24- 10 seconds Goal status: IN PROGRESS - 05/06/24: 4 sec declined since last assessment   6. Patient will score >35/56 on BERG to begin using cane again safely.   Baseline: NT, not safe  with cane 03/18/24 - 21/56  Goal status: IN PROGRESS - 05/06/24: declined since last assessment 16/56    PLAN:  PT FREQUENCY: 1-2x/week  PT DURATION: 6 weeks  PLANNED INTERVENTIONS: 97164- PT Re-evaluation, 97110-Therapeutic exercises, 97530- Therapeutic activity, 97112- Neuromuscular re-education, 97535- Self Care, 16109- Manual therapy, [id number]- Gait training, [id number]- Aquatic Therapy, [id number]-  Ultrasound, Patient/Family education, Balance training, Stair training, Taping, Dry Needling, Joint mobilization, Joint manipulation, Spinal manipulation, Spinal mobilization, Vestibular training, Cryotherapy, and Moist heat  PLAN FOR NEXT SESSION:  land alternating with aquatic therapy to continue to work on balance and LE strengthening   Francisco Irving, PT  05/25/2024, 4:03 PM  [facility] [address]  [address] [place], [place], [address] Phone: [phone]   Fax:  [phone]

## 2024-05-26 ENCOUNTER — Ambulatory Visit: Payer: Self-pay | Admitting: Family Medicine

## 2024-05-27 ENCOUNTER — Other Ambulatory Visit: Payer: Self-pay

## 2024-05-27 ENCOUNTER — Other Ambulatory Visit: Payer: Self-pay | Admitting: Family Medicine

## 2024-05-27 ENCOUNTER — Ambulatory Visit

## 2024-05-27 DIAGNOSIS — M6281 Muscle weakness (generalized): Secondary | ICD-10-CM

## 2024-05-27 DIAGNOSIS — R2681 Unsteadiness on feet: Secondary | ICD-10-CM

## 2024-05-27 DIAGNOSIS — R2689 Other abnormalities of gait and mobility: Secondary | ICD-10-CM

## 2024-05-27 DIAGNOSIS — I2699 Other pulmonary embolism without acute cor pulmonale: Secondary | ICD-10-CM

## 2024-05-27 DIAGNOSIS — M542 Cervicalgia: Secondary | ICD-10-CM

## 2024-05-27 NOTE — Therapy (Signed)
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 fundoplication, hernia repair, R foot drop, peripheral neuropathy. are also affecting patient's functional outcome.   REHAB POTENTIAL: Good  CLINICAL DECISION MAKING: Evolving/moderate complexity  EVALUATION COMPLEXITY: Moderate   GOALS: Goals reviewed with patient? Yes  SHORT TERM GOALS: Target date: 01/27/2024   Patient will be independent with initial HEP.  Baseline:  needs Goal status: MET - 01/29/24   LONG TERM GOALS: Target date: 03/30/2024 extended to 06/17/24  Patient will be independent with advanced/ongoing HEP to improve outcomes and carryover.  Baseline:  Goal status: MET - 03/18/24 - met for current , discussed gym exercises, transitioning to aquatic therapy.  05/06/24: progressing  2.  Patient will report 75% improvement in neck pain to improve QOL.  Baseline: 6/10 when moves neck Goal status: MET - 03/02/24 - neck pain has resolved  3.  Patient will report at least 8 points improvement on NDI to demonstrate improved functional ability.   Baseline: 17/50 Goal status: MET - 03/02/24 - 3/50  4.  Patient will demonstrate improved functional strength as demonstrated by 5x STS <25 seconds.   Baseline: 39 seconds with bil UE assist.  02/05/24: 29.65 sec with bil UE support   03/18/24: 18 seconds with bil UE support 05/06/24: 17.2 sec  improved Goal status: MET - 03/18/24 & 05/06/24  5. Patient will be able to stand without support for 30 seconds.    Baseline: 5 seconds.  03/18/24- 10  seconds Goal status: IN PROGRESS - 05/06/24: 4 sec declined since last assessment   6. Patient will score >35/56 on BERG to begin using cane again safely.   Baseline: NT, not safe with cane 03/18/24 - 21/56  Goal status: IN PROGRESS - 05/06/24: declined since last assessment 16/56    PLAN:  PT FREQUENCY: 1-2x/week  PT DURATION: 6 weeks  PLANNED INTERVENTIONS: 97164- PT Re-evaluation, 97110-Therapeutic exercises, 97530- Therapeutic activity, 97112- Neuromuscular re-education, 97535- Self Care, 16109- Manual therapy, [phone]- Gait training, [phone]- Aquatic Therapy, [phone]- Ultrasound, Patient/Family education, Balance training, Stair training, Taping, Dry Needling, Joint mobilization, Joint manipulation, Spinal manipulation, Spinal mobilization, Vestibular training, Cryotherapy, and Moist heat  PLAN FOR NEXT SESSION:  land alternating with aquatic therapy to continue to work on balance and LE strengthening   Consetta Cosner L Maurissa Ambrose, PT  05/27/2024, 4:35 PM  [facility] [address]  [address] [place], [place], [address] Phone: [phone]   Fax:  [phone]

## 2024-05-28 ENCOUNTER — Ambulatory Visit: Payer: PPO | Admitting: Podiatry

## 2024-05-28 ENCOUNTER — Other Ambulatory Visit: Payer: Self-pay

## 2024-05-28 ENCOUNTER — Other Ambulatory Visit (HOSPITAL_COMMUNITY): Payer: Self-pay

## 2024-05-28 MED ORDER — MEMANTINE HCL 10 MG PO TABS
20.0000 mg | ORAL_TABLET | Freq: Every day | ORAL | 0 refills | Status: DC
  Filled 2024-05-28: qty 180, 90d supply, fill #0

## 2024-05-28 MED ORDER — RIVAROXABAN 10 MG PO TABS
10.0000 mg | ORAL_TABLET | Freq: Every day | ORAL | 0 refills | Status: DC
  Filled 2024-05-28: qty 90, 90d supply, fill #0

## 2024-05-31 ENCOUNTER — Encounter: Payer: Self-pay | Admitting: Podiatry

## 2024-05-31 ENCOUNTER — Ambulatory Visit: Admitting: Podiatry

## 2024-05-31 DIAGNOSIS — B351 Tinea unguium: Secondary | ICD-10-CM | POA: Diagnosis not present

## 2024-05-31 DIAGNOSIS — M79675 Pain in left toe(s): Secondary | ICD-10-CM

## 2024-05-31 DIAGNOSIS — E1121 Type 2 diabetes mellitus with diabetic nephropathy: Secondary | ICD-10-CM | POA: Diagnosis not present

## 2024-05-31 DIAGNOSIS — M79674 Pain in right toe(s): Secondary | ICD-10-CM

## 2024-05-31 NOTE — Progress Notes (Signed)
This patient returns to my office for at risk foot care.  This patient requires this care by a professional since this patient will be at risk due to having diabetes and coagulation defect. This patient is unable to cut nails himself since the patient cannot reach his nails.These nails are painful walking and wearing shoes.  This patient presents for at risk foot care today.  General Appearance  Alert, conversant and in no acute stress.  Vascular  Dorsalis pedis and posterior tibial  pulses are palpable  bilaterally.  Capillary return is within normal limits  bilaterally. Cold feet. bilaterally.  Neurologic  Senn-Weinstein monofilament wire test diminished/absent bilaterally. Muscle power within normal limits bilaterally.  Nails Thick disfigured discolored nails with subungual debris  from hallux to fifth toes bilaterally. No evidence of bacterial infection or drainage bilaterally.  Orthopedic  No limitations of motion  feet .  No crepitus or effusions noted.  No bony pathology or digital deformities noted.  Midfoot arthritis  B/L.  Skin  normotropic skin with no porokeratosis noted bilaterally.  No signs of infections or ulcers noted.     Onychomycosis  Pain in right toes  Pain in left toes  Consent was obtained for treatment procedures.   Mechanical debridement of nails 1-5  bilaterally performed with a nail nipper.  Filed with dremel without incident.    Return office visit    4  months                 Told patient to return for periodic foot care and evaluation due to potential at risk complications.   Helane Gunther DPM

## 2024-06-01 ENCOUNTER — Ambulatory Visit

## 2024-06-01 ENCOUNTER — Other Ambulatory Visit: Payer: Self-pay

## 2024-06-01 DIAGNOSIS — R2689 Other abnormalities of gait and mobility: Secondary | ICD-10-CM

## 2024-06-01 DIAGNOSIS — M6281 Muscle weakness (generalized): Secondary | ICD-10-CM

## 2024-06-01 DIAGNOSIS — R2681 Unsteadiness on feet: Secondary | ICD-10-CM

## 2024-06-01 NOTE — Therapy (Signed)
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 injury/illness/exacerbation, and 3+ comorbidities: ACDF 2015, multiple back surgeries, T2DM, CHF, Gout, history of PE, HTN, arthritis, migraines, sensorineural hearing loss both ears, L cochlear implant, [employer] fundoplication, hernia repair, R foot drop, peripheral neuropathy. are also affecting patient's functional outcome.   REHAB POTENTIAL: Good  CLINICAL DECISION MAKING: Evolving/moderate complexity  EVALUATION COMPLEXITY: Moderate   GOALS: Goals reviewed with patient? Yes  SHORT TERM GOALS: Target date: 01/27/2024   Patient will be independent with initial HEP.  Baseline:  needs Goal status: MET - 01/29/24   LONG TERM GOALS: Target date: 03/30/2024 extended to 06/17/24  Patient will be independent with advanced/ongoing HEP to improve outcomes and carryover.  Baseline:  Goal status: MET - 03/18/24 - met for current , discussed gym exercises, transitioning to aquatic therapy.   05/06/24: progressing  2.  Patient will report 75% improvement in neck pain to improve QOL.  Baseline: 6/10 when moves neck Goal status: MET - 03/02/24 - neck pain has resolved  3.  Patient will report at least 8 points improvement on NDI to demonstrate improved functional ability.  Baseline: 17/50 Goal status: MET - 03/02/24 - 3/50  4.  Patient will demonstrate improved functional strength as demonstrated by 5x STS <25 seconds.  Baseline: 39 seconds with bil UE assist.  02/05/24: 29.65 sec with bil UE support   03/18/24: 18 seconds with bil UE support 05/06/24: 17.2 sec  improved Goal status: MET - 03/18/24 & 05/06/24  5. Patient will be able to stand without support for 30 seconds.    Baseline: 5 seconds.  03/18/24- 10 seconds Goal status: IN PROGRESS - 05/06/24: 4 sec declined since last assessment   6. Patient will score >35/56 on BERG to begin using cane again safely.   Baseline: NT, not safe with cane 03/18/24 - 21/56  Goal status: IN PROGRESS - 05/06/24: declined since last assessment 16/56    PLAN:  PT FREQUENCY: 1-2x/week  PT DURATION: 6 weeks  PLANNED INTERVENTIONS: 97164- PT Re-evaluation, 97110-Therapeutic exercises, 97530- Therapeutic activity, 97112- Neuromuscular re-education, 97535- Self Care, 29562- Manual therapy, [phone]- Gait training, [phone]- Aquatic Therapy, [phone]- Ultrasound, Patient/Family education, Balance training, Stair training, Taping, Dry Needling, Joint mobilization, Joint manipulation, Spinal manipulation, Spinal mobilization, Vestibular training, Cryotherapy, and Moist heat  PLAN FOR NEXT SESSION:  land alternating with aquatic therapy to continue to work on balance and LE strengthening   Rubena Roseman L Haim Hansson, PT , DPT, OCS 06/01/2024, 2:03 PM  [facility] [address]  [address] [place], [place], [address] Phone: [phone]   Fax:  [phone]

## 2024-06-03 ENCOUNTER — Ambulatory Visit

## 2024-06-03 ENCOUNTER — Other Ambulatory Visit: Payer: Self-pay

## 2024-06-03 DIAGNOSIS — M6281 Muscle weakness (generalized): Secondary | ICD-10-CM | POA: Diagnosis not present

## 2024-06-03 DIAGNOSIS — R2681 Unsteadiness on feet: Secondary | ICD-10-CM

## 2024-06-03 DIAGNOSIS — R2689 Other abnormalities of gait and mobility: Secondary | ICD-10-CM

## 2024-06-03 NOTE — Therapy (Signed)
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 ROM, decreased strength, increased fascial restrictions, impaired perceived functional ability, increased muscle spasms, impaired flexibility, impaired sensation, postural dysfunction, and pain.   ACTIVITY LIMITATIONS: carrying, lifting, bending, standing, sleeping, stairs, transfers, locomotion level, and caring for others  PARTICIPATION LIMITATIONS: meal prep, cleaning, laundry, driving, shopping, community activity, and yard work  PERSONAL FACTORS: Age, Past/current experiences, Time since onset of injury/illness/exacerbation, and 3+ comorbidities: ACDF 2015, multiple back surgeries, T2DM, CHF, Gout, history of PE, HTN, arthritis, migraines, sensorineural hearing loss both ears, L cochlear implant, [employer] fundoplication, hernia repair, R foot drop, peripheral neuropathy. are also affecting patient's functional outcome.    REHAB POTENTIAL: Good  CLINICAL DECISION MAKING: Evolving/moderate complexity  EVALUATION COMPLEXITY: Moderate   GOALS: Goals reviewed with patient? Yes  SHORT TERM GOALS: Target date: 01/27/2024   Patient will be independent with initial HEP.  Baseline:  needs Goal status: MET - 01/29/24   LONG TERM GOALS: Target date: 03/30/2024 extended to 06/17/24  Patient will be independent with advanced/ongoing HEP to improve outcomes and carryover.  Baseline:  Goal status: MET -  03/18/24 - met for current , discussed gym exercises, transitioning to aquatic therapy.  05/06/24: progressing  2.  Patient will report 75% improvement in neck pain to improve QOL.  Baseline: 6/10 when moves neck Goal status: MET - 03/02/24 - neck pain has resolved  3.  Patient will report at least 8 points improvement on NDI to demonstrate improved functional ability.  Baseline: 17/50 Goal status: MET - 03/02/24 - 3/50  4.  Patient will demonstrate improved functional strength as demonstrated by 5x STS <25 seconds.   Baseline: 39 seconds with bil UE assist.  02/05/24: 29.65 sec with bil UE support   03/18/24: 18 seconds with bil UE support 05/06/24: 17.2 sec  improved Goal status: MET - 03/18/24 & 05/06/24  5. Patient will be able to stand without support for 30 seconds.    Baseline: 5 seconds.  03/18/24- 10 seconds Goal status: IN PROGRESS - 05/06/24: 4 sec declined since last assessment   6. Patient will score >35/56 on BERG to begin using cane again safely.   Baseline: NT, not safe with cane 03/18/24 - 21/56  Goal status: IN PROGRESS - 05/06/24: declined since last assessment 16/56    PLAN:  PT FREQUENCY: 1-2x/week  PT DURATION: 6 weeks  PLANNED INTERVENTIONS: 97164- PT Re-evaluation, 97110-Therapeutic exercises, 97530- Therapeutic activity, 97112- Neuromuscular re-education, 97535- Self Care, 16109- Manual therapy, [phone]- Gait training, [phone]- Aquatic Therapy, [phone]- Ultrasound, Patient/Family education,  Balance training, Stair training, Taping, Dry Needling, Joint mobilization, Joint manipulation, Spinal manipulation, Spinal mobilization, Vestibular training, Cryotherapy, and Moist heat  PLAN FOR NEXT SESSION:  land alternating with aquatic therapy to continue to work on balance and LE strengthening   Yogi Arther L Nasteho Glantz, PT , DPT, OCS 06/03/2024, 3:48 PM  [facility] [address]  [address] [place], [place], [address] Phone: [phone]   Fax:  [phone]

## 2024-06-08 ENCOUNTER — Ambulatory Visit (HOSPITAL_BASED_OUTPATIENT_CLINIC_OR_DEPARTMENT_OTHER): Admitting: Physical Therapy

## 2024-06-09 ENCOUNTER — Other Ambulatory Visit (HOSPITAL_COMMUNITY): Payer: Self-pay

## 2024-06-09 ENCOUNTER — Other Ambulatory Visit: Payer: Self-pay

## 2024-06-09 MED ORDER — METFORMIN HCL ER 500 MG PO TB24
1000.0000 mg | ORAL_TABLET | Freq: Every day | ORAL | 1 refills | Status: DC
  Filled 2024-06-09: qty 180, 90d supply, fill #0
  Filled 2024-09-16: qty 180, 90d supply, fill #1

## 2024-06-11 ENCOUNTER — Ambulatory Visit (HOSPITAL_BASED_OUTPATIENT_CLINIC_OR_DEPARTMENT_OTHER): Attending: Family Medicine | Admitting: Physical Therapy

## 2024-06-11 ENCOUNTER — Encounter (HOSPITAL_BASED_OUTPATIENT_CLINIC_OR_DEPARTMENT_OTHER): Payer: Self-pay | Admitting: Physical Therapy

## 2024-06-11 DIAGNOSIS — R2681 Unsteadiness on feet: Secondary | ICD-10-CM | POA: Insufficient documentation

## 2024-06-11 DIAGNOSIS — M542 Cervicalgia: Secondary | ICD-10-CM | POA: Diagnosis present

## 2024-06-11 DIAGNOSIS — M5459 Other low back pain: Secondary | ICD-10-CM | POA: Diagnosis present

## 2024-06-11 DIAGNOSIS — M6281 Muscle weakness (generalized): Secondary | ICD-10-CM | POA: Diagnosis present

## 2024-06-11 DIAGNOSIS — R2689 Other abnormalities of gait and mobility: Secondary | ICD-10-CM | POA: Diagnosis present

## 2024-06-11 NOTE — Therapy (Signed)
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 seconds with UE support.  However, he is still extremely unsteady and demonstrates poor core strength, and fatigues very quickly.  He is independent with land program and is planning to transition to [facility] to start using machines there.   Due to his very poor balance (today Randye Buttner was only 21/56 and he was only able to stand 5-10 seconds without support), he is going to start transitioning to aquatic therapy, where the support of water  will let him work on his balance in a safer environment, so extending his POC for additional 8 weeks (1-2x/week).  Abe Hodgkins Faughn continues to demonstrate potential for improvement and would benefit from continued skilled therapy to address impairments.    OBJECTIVE IMPAIRMENTS: Abnormal gait, decreased activity tolerance, decreased balance, decreased endurance, decreased mobility,  difficulty walking, decreased ROM, decreased strength, increased fascial restrictions, impaired perceived functional ability, increased muscle spasms, impaired flexibility, impaired sensation, postural dysfunction, and pain.   ACTIVITY LIMITATIONS: carrying, lifting, bending, standing, sleeping, stairs, transfers, locomotion level, and  caring for others  PARTICIPATION LIMITATIONS: meal prep, cleaning, laundry, driving, shopping, community activity, and yard work  PERSONAL FACTORS: Age, Past/current experiences, Time since onset of injury/illness/exacerbation, and 3+ comorbidities: ACDF 2015, multiple back surgeries, T2DM, CHF, Gout, history of PE, HTN, arthritis, migraines, sensorineural hearing loss both ears, L cochlear implant, [employer] fundoplication, hernia repair, R foot drop, peripheral neuropathy. are also affecting patient's functional outcome.   REHAB POTENTIAL: Good  CLINICAL DECISION MAKING: Evolving/moderate complexity  EVALUATION COMPLEXITY: Moderate   GOALS: Goals reviewed with patient? Yes  SHORT TERM GOALS: Target date: 01/27/2024   Patient will be independent with initial HEP.  Baseline:  needs Goal status: MET - 01/29/24   LONG TERM GOALS: Target date: 03/30/2024 extended to 06/17/24  Patient will be independent with advanced/ongoing HEP to improve outcomes and carryover.  Baseline:  Goal status: MET - 03/18/24 - met for current , discussed gym exercises, transitioning to aquatic therapy.  05/06/24: progressing  2.  Patient will report 75% improvement in neck pain to improve QOL.  Baseline: 6/10 when moves neck Goal status: MET - 03/02/24 - neck pain has resolved  3.  Patient will report at least 8 points improvement on NDI to demonstrate improved functional ability.  Baseline: 17/50 Goal status: MET - 03/02/24 - 3/50  4.  Patient will demonstrate improved functional strength as demonstrated by 5x STS <25 seconds.   Baseline: 39 seconds with bil UE assist.  02/05/24:  29.65 sec with bil UE support   03/18/24: 18 seconds with bil UE support 05/06/24: 17.2 sec  improved Goal status: MET - 03/18/24 & 05/06/24  5. Patient will be able to stand without support for 30 seconds.    Baseline: 5 seconds.  03/18/24- 10 seconds Goal status: IN PROGRESS - 05/06/24: 4 sec declined since last assessment   6. Patient will score >35/56 on BERG to begin using cane again safely.   Baseline: NT, not safe with cane 03/18/24 - 21/56  Goal status: IN PROGRESS - 05/06/24: declined since last assessment 16/56    PLAN:  PT FREQUENCY: 1-2x/week  PT DURATION: 6 weeks  PLANNED INTERVENTIONS: 97164- PT Re-evaluation, 97110-Therapeutic exercises, 97530- Therapeutic activity, 97112- Neuromuscular re-education, 97535- Self Care, 29562- Manual therapy, [phone]- Gait training, [phone]- Aquatic Therapy, [phone]- Ultrasound, Patient/Family education, Balance training, Stair training, Taping, Dry Needling, Joint mobilization, Joint manipulation, Spinal manipulation, Spinal mobilization, Vestibular training, Cryotherapy, and Moist heat  PLAN FOR NEXT SESSION:  land alternating with aquatic therapy to continue to work on balance and LE strengthening   Almedia Jacobsen, PTA 06/11/24 5:20 PM [facility] [address] [place], [place], [address] Phone: [phone]   Fax:  [phone]

## 2024-06-14 ENCOUNTER — Telehealth: Payer: Self-pay | Admitting: Gastroenterology

## 2024-06-14 ENCOUNTER — Other Ambulatory Visit (HOSPITAL_COMMUNITY): Payer: Self-pay

## 2024-06-14 NOTE — Telephone Encounter (Signed)
 Patient is calling to reschedule his Hospital procedure for July the 28 th. Patient stated that he will be out of town for a high school reunion and will not be back until Tuesday July the 29 th. Patient is requesting a call back.Please advise.

## 2024-06-15 ENCOUNTER — Encounter (HOSPITAL_BASED_OUTPATIENT_CLINIC_OR_DEPARTMENT_OTHER): Payer: Self-pay | Admitting: Physical Therapy

## 2024-06-15 ENCOUNTER — Ambulatory Visit (HOSPITAL_BASED_OUTPATIENT_CLINIC_OR_DEPARTMENT_OTHER): Admitting: Physical Therapy

## 2024-06-15 DIAGNOSIS — M6281 Muscle weakness (generalized): Secondary | ICD-10-CM | POA: Diagnosis not present

## 2024-06-15 DIAGNOSIS — R2689 Other abnormalities of gait and mobility: Secondary | ICD-10-CM

## 2024-06-15 DIAGNOSIS — M5459 Other low back pain: Secondary | ICD-10-CM

## 2024-06-15 DIAGNOSIS — M542 Cervicalgia: Secondary | ICD-10-CM

## 2024-06-15 DIAGNOSIS — R2681 Unsteadiness on feet: Secondary | ICD-10-CM

## 2024-06-15 NOTE — Therapy (Signed)
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 goals.    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  No longer has neck pain.  Reassessment with a general decline in balance, unable to stand unsupported and decreased Berg score, possibly due to being down with his surgery, also he may be reaching a plateau with balance due to his co morbidities.  We determined to extend his treatment to allow for an additional 2 to 4 more treatment sessions in the pool, alternating with some land sessions for balance, with the intention to [facility] fully [facility] the [facility] in the next 6  weeks.  His strength overall tested  better today and hopefully the buoyancy of the pool will provide the support for him to develop better control of his righting reactions, ankles.   PN: Garrel has made good balance in PT.  His neck pain has resolved, and his functional LE strength has improved significantly, with his 5x STS time improving from 39 seconds to 18 seconds with UE support.  However, he is still extremely unsteady and demonstrates poor core strength, and fatigues very quickly.  He is independent with land program and is planning to transition to [facility] to start using machines there.   Due to his very poor balance (today Lars was only 21/56 and he was only able to stand 5-10 seconds without support), he is going to start transitioning to aquatic therapy, where the support of water  will let him work on his balance in a safer environment, so extending his POC for additional 8 weeks (1-2x/week).  Ozell BIRCH Bohlin continues to demonstrate potential for improvement and would benefit from continued skilled therapy to address impairments.    OBJECTIVE IMPAIRMENTS: Abnormal gait, decreased activity tolerance, decreased balance, decreased endurance, decreased mobility, difficulty walking, decreased ROM, decreased strength, increased fascial restrictions, impaired perceived functional ability, increased muscle spasms, impaired flexibility, impaired sensation, postural dysfunction, and pain.   ACTIVITY LIMITATIONS: carrying, lifting, bending, standing, sleeping, stairs, transfers, locomotion level, and caring for others  PARTICIPATION LIMITATIONS: meal prep, cleaning, laundry, driving, shopping, community activity, and yard work  PERSONAL FACTORS: Age, Past/current experiences, Time since onset of injury/illness/exacerbation, and 3+ comorbidities: ACDF 2015, multiple back surgeries, T2DM, CHF, Gout, history of PE, HTN, arthritis, migraines, sensorineural hearing loss both ears, L cochlear implant, [employer] fundoplication, hernia repair, R foot  drop, peripheral neuropathy. are also affecting patient's functional outcome.   REHAB POTENTIAL: Good  CLINICAL DECISION MAKING: Evolving/moderate complexity  EVALUATION COMPLEXITY: Moderate   GOALS: Goals reviewed with patient? Yes  SHORT TERM GOALS: Target date: 01/27/2024   Patient will be independent with initial HEP.  Baseline:  needs Goal status: MET - 01/29/24   LONG TERM GOALS: Target date: POC  Patient will be independent with advanced/ongoing HEP to improve outcomes and carryover.  Baseline:  Goal status: MET - 03/18/24 - met for current , discussed gym exercises, transitioning to aquatic therapy.  05/06/24: progressing  2.  Patient will report 75% improvement in neck pain to improve QOL.  Baseline: 6/10 when moves neck Goal status: MET - 03/02/24 - neck pain has resolved  3.  Patient will report at least 8 points improvement on NDI to demonstrate improved functional ability.  Baseline: 17/50 Goal status: MET - 03/02/24 - 3/50  4.  Patient will demonstrate improved functional strength as demonstrated by 5x STS <25 seconds.   Baseline: 39 seconds with bil UE assist.  02/05/24: 29.65 sec with bil UE support   03/18/24: 18 seconds with bil UE support 05/06/24: 17.2 sec  improved Goal status: MET - 03/18/24 & 05/06/24  5. Patient will be able to stand without support for 30 seconds.    Baseline: 5 seconds.  03/18/24- 10 seconds Goal status: IN PROGRESS - 05/06/24: 4 sec declined since last assessment   6. Patient will score >35/56 on BERG to begin using cane again safely.   Baseline: NT, not safe with cane 03/18/24 - 21/56  Goal status: IN PROGRESS - 05/06/24: declined since last assessment 16/56    PLAN:  PT FREQUENCY: 1-2x/week  PT DURATION: 6 weeks  PLANNED INTERVENTIONS: 02835-  PT Re-evaluation, 97110-Therapeutic exercises, 97530- Therapeutic activity, [id number]- Neuromuscular re-education, [phone]- Self Care, 02859- Manual therapy, [phone]- Gait training, [phone]- Aquatic  Therapy, [phone]- Ultrasound, Patient/Family education, Balance training, Stair training, Taping, Dry Needling, Joint mobilization, Joint manipulation, Spinal manipulation, Spinal mobilization, Vestibular training, Cryotherapy, and Moist heat  PLAN FOR NEXT SESSION:  land alternating with aquatic therapy to continue to work on balance and LE strengthening  Delon Aquas, PTA 06/15/24 3:08 PM [facility] [address] [place], [place], [address] Phone: [phone]   Fax:  [phone]

## 2024-06-17 ENCOUNTER — Encounter (HOSPITAL_BASED_OUTPATIENT_CLINIC_OR_DEPARTMENT_OTHER): Payer: Self-pay | Admitting: Physical Therapy

## 2024-06-17 ENCOUNTER — Ambulatory Visit (HOSPITAL_BASED_OUTPATIENT_CLINIC_OR_DEPARTMENT_OTHER): Admitting: Physical Therapy

## 2024-06-17 DIAGNOSIS — M6281 Muscle weakness (generalized): Secondary | ICD-10-CM | POA: Diagnosis not present

## 2024-06-17 DIAGNOSIS — M5459 Other low back pain: Secondary | ICD-10-CM

## 2024-06-17 DIAGNOSIS — R2689 Other abnormalities of gait and mobility: Secondary | ICD-10-CM

## 2024-06-17 DIAGNOSIS — R2681 Unsteadiness on feet: Secondary | ICD-10-CM

## 2024-06-17 NOTE — Telephone Encounter (Signed)
 Rescheduled egd with patient for 09/02/24 at 7:30 am at [facility] with Dr. Charlanne. Updated instructions sent to patient on mychart. He verbalized all understanding & had no further questions.

## 2024-06-17 NOTE — Therapy (Signed)
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 Seated Long Arc Quad with Ankle Weight  - 1 x daily - 7 x weekly - 2 sets - 10 reps - Seated March with Resistance  - 1 x daily - 7 x weekly - 2 sets - 10 reps - Standing Hip Abduction with Counter Support  - 1 x daily - 7 x weekly - 2 sets - 10 reps - Standing Hip Extension with Counter Support  - 1 x daily - 7 x weekly - 2 sets - 10 reps - Standing March with Counter Support  - 1 x daily - 7 x weekly - 2 sets - 10 reps - Seated Shoulder Row with Anchored Resistance  - 1 x daily - 7 x weekly -  3 sets - 10 reps - Seated Shoulder Extension and Scapular Retraction with Resistance  - 1 x daily - 7 x weekly - 3 sets - 10 reps  AQUATIC Access Code: [email or web address] URL: [email or web address] Date: 06/15/2024 Prepared by: [facility] [address] This aquatic home exercise program from [employer] utilizes pictures from land based exercises, but has been adapted prior to lamination and issuance.    ASSESSMENT:  CLINICAL IMPRESSION: Instructed pt on aquatic exercises of HEP.  Pt was issued laminated HEP; verbalized understanding and was  able to complete exercises with minimal cues.  Progressing towards remaining goals. PT to assess goals next visit.    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  No longer has neck pain.  Reassessment with a general decline in balance, unable to stand unsupported and decreased Berg score, possibly due to being down with his surgery, also he may be reaching a plateau with balance due to his co morbidities.  We determined to extend his treatment to allow for an additional 2 to 4 more treatment sessions in the pool, alternating with some land sessions for balance, with the intention to [facility] fully [facility] the [facility] in the next 6 weeks.  His strength overall tested better today and hopefully the buoyancy of the pool will provide the support for him to develop better control of his righting reactions, ankles.   PN: Garrel has made good balance in PT.  His neck pain has resolved, and his functional LE strength has improved significantly, with his 5x STS time improving from 39 seconds to 18 seconds with UE support.  However, he is still extremely unsteady and demonstrates poor core strength, and fatigues very quickly.  He is independent with land program and is planning to transition to [facility] to start using machines there.   Due to his very poor balance (today Lars was only 21/56 and he was only able to stand 5-10 seconds without support), he is going to start transitioning to aquatic therapy, where the support of water  will let him work on his balance in a safer environment, so extending his POC for additional 8 weeks (1-2x/week).  Ozell BIRCH Pitkin continues to demonstrate potential for improvement and would benefit from continued skilled therapy to  address impairments.    OBJECTIVE IMPAIRMENTS: Abnormal gait, decreased activity tolerance, decreased balance, decreased endurance, decreased mobility, difficulty walking, decreased ROM, decreased strength, increased fascial restrictions, impaired perceived functional ability, increased muscle spasms, impaired flexibility, impaired sensation, postural dysfunction, and pain.   ACTIVITY LIMITATIONS: carrying, lifting, bending, standing, sleeping, stairs, transfers, locomotion level, and caring for others  PARTICIPATION LIMITATIONS: meal prep, cleaning, laundry, driving, shopping, community activity, and yard work  PERSONAL FACTORS: Age, Past/current experiences, Time since onset of injury/illness/exacerbation, and 3+ comorbidities: ACDF 2015, multiple back surgeries, T2DM, CHF, Gout, history of PE, HTN, arthritis, migraines, sensorineural hearing loss both ears, L cochlear implant, [employer] fundoplication, hernia repair, R foot drop, peripheral neuropathy. are also affecting patient's functional outcome.   REHAB POTENTIAL: Good  CLINICAL DECISION MAKING: Evolving/moderate complexity  EVALUATION COMPLEXITY: Moderate   GOALS: Goals reviewed with patient? Yes  SHORT TERM GOALS: Target date: 01/27/2024   Patient will be independent with initial  HEP.  Baseline:  needs Goal status: MET - 01/29/24   LONG TERM GOALS: Target date: POC  Patient will be independent with advanced/ongoing HEP to improve outcomes and carryover.  Baseline:  Goal status: MET - 03/18/24 - met for current , discussed gym exercises, transitioning to aquatic therapy.  05/06/24: progressing  2.  Patient will report 75% improvement in neck pain to improve QOL.  Baseline: 6/10 when moves neck Goal status: MET - 03/02/24 - neck pain has resolved  3.  Patient will report at least 8 points improvement on NDI to demonstrate improved functional ability.  Baseline: 17/50 Goal status: MET - 03/02/24 - 3/50  4.  Patient will  demonstrate improved functional strength as demonstrated by 5x STS <25 seconds.   Baseline: 39 seconds with bil UE assist.  02/05/24: 29.65 sec with bil UE support   03/18/24: 18 seconds with bil UE support 05/06/24: 17.2 sec  improved Goal status: MET - 03/18/24 & 05/06/24  5. Patient will be able to stand without support for 30 seconds.    Baseline: 5 seconds.  03/18/24- 10 seconds Goal status: IN PROGRESS - 05/06/24: 4 sec declined since last assessment   6. Patient will score >35/56 on BERG to begin using cane again safely.   Baseline: NT, not safe with cane 03/18/24 - 21/56  Goal status: IN PROGRESS - 05/06/24: declined since last assessment 16/56    PLAN:  PT FREQUENCY: 1-2x/week  PT DURATION: 6 weeks  PLANNED INTERVENTIONS: 97164- PT Re-evaluation, 97110-Therapeutic exercises, 97530- Therapeutic activity, 97112- Neuromuscular re-education, 97535- Self Care, [id number]- Manual therapy, [phone]- Gait training, [phone]- Aquatic Therapy, [phone]- Ultrasound, Patient/Family education, Balance training, Stair training, Taping, Dry Needling, Joint mobilization, Joint manipulation, Spinal manipulation, Spinal mobilization, Vestibular training, Cryotherapy, and Moist heat  PLAN FOR NEXT SESSION:  land alternating with aquatic therapy to continue to work on balance and LE strengthening  Delon Aquas, PTA 06/17/24 2:50 PM [facility] [address] [place], [place], [address] Phone: [phone]   Fax:  [phone]

## 2024-06-22 ENCOUNTER — Ambulatory Visit: Attending: Orthopedic Surgery

## 2024-06-22 ENCOUNTER — Other Ambulatory Visit: Payer: Self-pay

## 2024-06-22 DIAGNOSIS — M5459 Other low back pain: Secondary | ICD-10-CM | POA: Diagnosis present

## 2024-06-22 DIAGNOSIS — M6281 Muscle weakness (generalized): Secondary | ICD-10-CM | POA: Diagnosis present

## 2024-06-22 DIAGNOSIS — M25661 Stiffness of right knee, not elsewhere classified: Secondary | ICD-10-CM | POA: Diagnosis present

## 2024-06-22 DIAGNOSIS — R2681 Unsteadiness on feet: Secondary | ICD-10-CM | POA: Diagnosis present

## 2024-06-22 DIAGNOSIS — R2689 Other abnormalities of gait and mobility: Secondary | ICD-10-CM | POA: Insufficient documentation

## 2024-06-22 DIAGNOSIS — M542 Cervicalgia: Secondary | ICD-10-CM | POA: Insufficient documentation
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
## 2024-06-29 ENCOUNTER — Ambulatory Visit: Payer: Self-pay | Admitting: Family Medicine

## 2024-06-29 ENCOUNTER — Ambulatory Visit: Admitting: Family Medicine

## 2024-06-29 ENCOUNTER — Encounter: Payer: Self-pay | Admitting: Family Medicine

## 2024-06-29 VITALS — BP 112/80 | HR 86 | Temp 97.8°F | Resp 18 | Ht 69.0 in | Wt 198.8 lb

## 2024-06-29 DIAGNOSIS — E1122 Type 2 diabetes mellitus with diabetic chronic kidney disease: Secondary | ICD-10-CM | POA: Diagnosis not present

## 2024-06-29 DIAGNOSIS — E1121 Type 2 diabetes mellitus with diabetic nephropathy: Secondary | ICD-10-CM

## 2024-06-29 DIAGNOSIS — E1169 Type 2 diabetes mellitus with other specified complication: Secondary | ICD-10-CM | POA: Diagnosis not present

## 2024-06-29 DIAGNOSIS — K219 Gastro-esophageal reflux disease without esophagitis: Secondary | ICD-10-CM

## 2024-06-29 DIAGNOSIS — Z0000 Encounter for general adult medical examination without abnormal findings: Secondary | ICD-10-CM | POA: Diagnosis not present

## 2024-06-29 DIAGNOSIS — E1165 Type 2 diabetes mellitus with hyperglycemia: Secondary | ICD-10-CM

## 2024-06-29 DIAGNOSIS — N182 Chronic kidney disease, stage 2 (mild): Secondary | ICD-10-CM

## 2024-06-29 DIAGNOSIS — D500 Iron deficiency anemia secondary to blood loss (chronic): Secondary | ICD-10-CM

## 2024-06-29 DIAGNOSIS — Z23 Encounter for immunization: Secondary | ICD-10-CM

## 2024-06-29 DIAGNOSIS — I2510 Atherosclerotic heart disease of native coronary artery without angina pectoris: Secondary | ICD-10-CM

## 2024-06-29 DIAGNOSIS — E785 Hyperlipidemia, unspecified: Secondary | ICD-10-CM

## 2024-06-29 DIAGNOSIS — I5032 Chronic diastolic (congestive) heart failure: Secondary | ICD-10-CM

## 2024-06-29 DIAGNOSIS — I129 Hypertensive chronic kidney disease with stage 1 through stage 4 chronic kidney disease, or unspecified chronic kidney disease: Secondary | ICD-10-CM

## 2024-06-29 DIAGNOSIS — H903 Sensorineural hearing loss, bilateral: Secondary | ICD-10-CM

## 2024-06-29 LAB — MICROALBUMIN / CREATININE URINE RATIO
Creatinine,U: 91.6 mg/dL
Microalb Creat Ratio: 12.3 mg/g (ref 0.0–30.0)
Microalb, Ur: 1.1 mg/dL (ref 0.0–1.9)

## 2024-06-29 NOTE — Assessment & Plan Note (Signed)
 Well controlled, no changes to meds. Encouraged heart healthy diet such as the DASH diet and exercise as tolerated.

## 2024-06-29 NOTE — Patient Instructions (Signed)
 Preventive Care 73 Years and Older, Male Preventive care refers to lifestyle choices and visits with your health care provider that can promote health and wellness. Preventive care visits are also called wellness exams. What can I expect for my preventive care visit? Counseling During your preventive care visit, your health care provider may ask about your: Medical history, including: Past medical problems. Family medical history. History of falls. Current health, including: Emotional well-being. Home life and relationship well-being. Sexual activity. Memory and ability to understand (cognition). Lifestyle, including: Alcohol, nicotine or tobacco, and drug use. Access to firearms. Diet, exercise, and sleep habits. Work and work Astronomer. Sunscreen use. Safety issues such as seatbelt and bike helmet use. Physical exam Your health care provider will check your: Height and weight. These may be used to calculate your BMI (body mass index). BMI is a measurement that tells if you are at a healthy weight. Waist circumference. This measures the distance around your waistline. This measurement also tells if you are at a healthy weight and may help predict your risk of certain diseases, such as type 2 diabetes and high blood pressure. Heart rate and blood pressure. Body temperature. Skin for abnormal spots. What immunizations do I need?  Vaccines are usually given at various ages, according to a schedule. Your health care provider will recommend vaccines for you based on your age, medical history, and lifestyle or other factors, such as travel or where you work. What tests do I need? Screening Your health care provider may recommend screening tests for certain conditions. This may include: Lipid and cholesterol levels. Diabetes screening. This is done by checking your blood sugar (glucose) after you have not eaten for a while (fasting). Hepatitis C test. Hepatitis B test. HIV (human  immunodeficiency virus) test. STI (sexually transmitted infection) testing, if you are at risk. Lung cancer screening. Colorectal cancer screening. Prostate cancer screening. Abdominal aortic aneurysm (AAA) screening. You may need this if you are a current or former smoker. Talk with your health care provider about your test results, treatment options, and if necessary, the need for more tests. Follow these instructions at home: Eating and drinking  Eat a diet that includes fresh fruits and vegetables, whole grains, lean protein, and low-fat dairy products. Limit your intake of foods with high amounts of sugar, saturated fats, and salt. Take vitamin and mineral supplements as recommended by your health care provider. Do not drink alcohol if your health care provider tells you not to drink. If you drink alcohol: Limit how much you have to 0-2 drinks a day. Know how much alcohol is in your drink. In the [place] one drink equals one 12 oz bottle of beer (355 mL), one 5 oz glass of wine (148 mL), or one 1 oz glass of hard liquor (44 mL). Lifestyle Brush your teeth every morning and night with fluoride toothpaste. Floss one time each day. Exercise for at least 30 minutes 5 or more days each week. Do not use any products that contain nicotine or tobacco. These products include cigarettes, chewing tobacco, and vaping devices, such as e-cigarettes. If you need help quitting, ask your health care provider. Do not use drugs. If you are sexually active, practice safe sex. Use a condom or other form of protection to prevent STIs. Take aspirin only as told by your health care provider. Make sure that you understand how much to take and what form to take. Work with your health care provider to find out whether it is safe  and beneficial for you to take aspirin daily. Ask your health care provider if you need to take a cholesterol-lowering medicine (statin). Find healthy ways to manage stress, such  as: Meditation, yoga, or listening to music. Journaling. Talking to a trusted person. Spending time with friends and family. Safety Always wear your seat belt while driving or riding in a vehicle. Do not drive: If you have been drinking alcohol. Do not ride with someone who has been drinking. When you are tired or distracted. While texting. If you have been using any mind-altering substances or drugs. Wear a helmet and other protective equipment during sports activities. If you have firearms in your house, make sure you follow all gun safety procedures. Minimize exposure to UV radiation to reduce your risk of skin cancer. What's next? Visit your health care provider once a year for an annual wellness visit. Ask your health care provider how often you should have your eyes and teeth checked. Stay up to date on all vaccines. This information is not intended to replace advice given to you by your health care provider. Make sure you discuss any questions you have with your health care provider. Document Revised: 06/06/2021 Document Reviewed: 06/06/2021 Elsevier Patient Education  2024 [employer].

## 2024-06-29 NOTE — Assessment & Plan Note (Signed)
 Hgba1c to be checked , minimize simple carbs. Increase exercise as tolerated. Continue current meds
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
## 2024-06-29 NOTE — Assessment & Plan Note (Signed)
 Check labs

## 2024-06-29 NOTE — Assessment & Plan Note (Signed)
Stable Per cardiology 

## 2024-06-29 NOTE — Assessment & Plan Note (Signed)
 Tolerating statin, encouraged heart healthy diet, avoid trans fats, minimize simple carbs and saturated fats. Increase exercise as tolerated

## 2024-06-29 NOTE — Assessment & Plan Note (Signed)
 Ghm utd Check labs  See AVS Health Maintenance  Topic Date Due   Diabetic kidney evaluation - Urine ACR  08/17/2021   COVID-19 Vaccine (5 - 2024-25 season) 08/24/2023   Medicare Annual Wellness (AWV)  07/07/2024   INFLUENZA VACCINE  07/23/2024   HEMOGLOBIN A1C  11/23/2024   OPHTHALMOLOGY EXAM  02/19/2025   FOOT EXAM  03/08/2025   Diabetic kidney evaluation - eGFR measurement  05/24/2025   Colonoscopy  08/14/2028   DTaP/Tdap/Td (4 - Td or Tdap) 07/21/2029   Pneumococcal Vaccine: 50+ Years  Completed   Hepatitis C Screening  Completed   Zoster Vaccines- Shingrix   Completed   Hepatitis B Vaccines  Aged Out   HPV VACCINES  Aged Out   Meningococcal B Vaccine  Aged Out

## 2024-06-29 NOTE — Assessment & Plan Note (Addendum)
 Lab Results  Component Value Date   WBC 7.7 05/24/2024   HGB 12.4 (L) 05/24/2024   HCT 37.4 (L) 05/24/2024   MCV 88.6 05/24/2024   PLT 304.0 05/24/2024

## 2024-06-29 NOTE — Assessment & Plan Note (Signed)
 cochlear implant

## 2024-06-30 ENCOUNTER — Other Ambulatory Visit: Payer: Self-pay

## 2024-07-09 ENCOUNTER — Other Ambulatory Visit (HOSPITAL_COMMUNITY): Payer: Self-pay

## 2024-07-15 ENCOUNTER — Telehealth: Payer: Self-pay | Admitting: Family Medicine

## 2024-07-15 ENCOUNTER — Telehealth: Payer: Self-pay | Admitting: *Deleted

## 2024-07-15 ENCOUNTER — Ambulatory Visit: Admitting: *Deleted

## 2024-07-15 VITALS — BP 125/78 | HR 85 | Temp 98.7°F | Resp 18 | Ht 69.0 in | Wt 199.2 lb

## 2024-07-15 DIAGNOSIS — Z0000 Encounter for general adult medical examination without abnormal findings: Secondary | ICD-10-CM

## 2024-07-15 NOTE — Telephone Encounter (Signed)
 Pt dropped of papers to be filled out by pcp. Placed papers in pcps box. Please call pt when papers are ready to be picked up

## 2024-07-15 NOTE — Telephone Encounter (Signed)
 FYI:  Pt had AWV today. Notes that he dropped off a handicap parking application at front desk today. Also reports fecal incontinence 1-2 x monthly and only occurs during the night. Has been occurring x 1 year. Currently wears depends. We have scheduled him an appointment on 07/27/24 at 5:20pm to discuss.
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
## 2024-07-15 NOTE — Patient Instructions (Addendum)
 [name] , Thank you for taking time out of your busy schedule to complete your Annual Wellness Visit with me. I enjoyed our conversation and look forward to speaking with you again next year. I, as well as your care team,  appreciate your ongoing commitment to your health goals. Please review the following plan we discussed and let me know if I can assist you in the future. Your Game plan/ To Do List     Follow up Visits: Next Medicare AWV with our clinical staff: 07/19/25 3pm    Next Office Visit with your provider: 07/27/25 5:20pm  Clinician Recommendations:  Aim for 30 minutes of exercise or brisk walking, 6-8 glasses of water , and 5 servings of fruits and vegetables each day.   You will need to get the following vaccines at your local pharmacy: COVID.-+     This is a list of the screening recommended for you and due dates:  Health Maintenance  Topic Date Due   COVID-19 Vaccine (5 - 2024-25 season) 08/24/2023   Medicare Annual Wellness Visit  07/07/2024   Flu Shot  07/23/2024   Hemoglobin A1C  11/23/2024   Eye exam for diabetics  02/19/2025   Complete foot exam   03/08/2025   Yearly kidney function blood test for diabetes  05/24/2025   Yearly kidney health urinalysis for diabetes  06/29/2025   Colon Cancer Screening  08/14/2028   DTaP/Tdap/Td vaccine (4 - Td or Tdap) 07/21/2029   Pneumococcal Vaccine for age over 12  Completed   Hepatitis C Screening  Completed   Zoster (Shingles) Vaccine  Completed   Hepatitis B Vaccine  Aged Out   HPV Vaccine  Aged Out   Meningitis B Vaccine  Aged Out    Advanced directives: (ACP Link)Information on Advanced Care Planning can be found at [place]  Print production planner Health Care Directives Advance Health Care Directives. [email or web address]  Advance Care Planning is important because it:  [x]  Makes sure you receive the medical care that is consistent with your values, goals, and preferences  [x]  It provides guidance to your family and  loved ones and reduces their decisional burden about whether or not they are making the right decisions based on your wishes.  Follow the link provided in your after visit summary or read over the paperwork we have mailed to you to help you started getting your Advance Directives in place. If you need assistance in completing these, please reach out to us  so that we can help you!  See attachments for Preventive Care and Fall Prevention Tips.

## 2024-07-16 ENCOUNTER — Ambulatory Visit: Payer: PPO | Admitting: Neurology

## 2024-07-19 DIAGNOSIS — K219 Gastro-esophageal reflux disease without esophagitis: Secondary | ICD-10-CM | POA: Insufficient documentation

## 2024-07-19 DIAGNOSIS — R1310 Dysphagia, unspecified: Secondary | ICD-10-CM

## 2024-07-21 NOTE — Telephone Encounter (Signed)
Placed in folder for sig

## 2024-07-22 ENCOUNTER — Encounter (HOSPITAL_BASED_OUTPATIENT_CLINIC_OR_DEPARTMENT_OTHER): Payer: Self-pay | Admitting: Emergency Medicine

## 2024-07-22 ENCOUNTER — Emergency Department (HOSPITAL_BASED_OUTPATIENT_CLINIC_OR_DEPARTMENT_OTHER)

## 2024-07-22 ENCOUNTER — Emergency Department (HOSPITAL_BASED_OUTPATIENT_CLINIC_OR_DEPARTMENT_OTHER)
Admission: EM | Admit: 2024-07-22 | Discharge: 2024-07-22 | Disposition: A | Discharge status: Home / Self Care | Attending: Emergency Medicine | Admitting: Emergency Medicine

## 2024-07-22 ENCOUNTER — Other Ambulatory Visit: Payer: Self-pay

## 2024-07-22 DIAGNOSIS — S81801A Unspecified open wound, right lower leg, initial encounter: Secondary | ICD-10-CM | POA: Insufficient documentation

## 2024-07-22 DIAGNOSIS — L0889 Other specified local infections of the skin and subcutaneous tissue: Secondary | ICD-10-CM | POA: Insufficient documentation

## 2024-07-22 DIAGNOSIS — X58XXXA Exposure to other specified factors, initial encounter: Secondary | ICD-10-CM | POA: Insufficient documentation

## 2024-07-22 DIAGNOSIS — L089 Local infection of the skin and subcutaneous tissue, unspecified: Secondary | ICD-10-CM

## 2024-07-22 LAB — CBC WITH DIFFERENTIAL/PLATELET
Abs Immature Granulocytes: 0.03 K/uL (ref 0.00–0.07)
Basophils Absolute: 0.1 K/uL (ref 0.0–0.1)
Basophils Relative: 1 %
Eosinophils Absolute: 0.1 K/uL (ref 0.0–0.5)
Eosinophils Relative: 1 %
HCT: 38.4 % — ABNORMAL LOW (ref 39.0–52.0)
Hemoglobin: 12.4 g/dL — ABNORMAL LOW (ref 13.0–17.0)
Immature Granulocytes: 0 %
Lymphocytes Relative: 18 %
Lymphs Abs: 1.5 K/uL (ref 0.7–4.0)
MCH: 29.3 pg (ref 26.0–34.0)
MCHC: 32.3 g/dL (ref 30.0–36.0)
MCV: 90.8 fL (ref 80.0–100.0)
Monocytes Absolute: 0.7 K/uL (ref 0.1–1.0)
Monocytes Relative: 8 %
Neutro Abs: 6.3 K/uL (ref 1.7–7.7)
Neutrophils Relative %: 72 %
Platelets: 370 K/uL (ref 150–400)
RBC: 4.23 MIL/uL (ref 4.22–5.81)
RDW: 15.3 % (ref 11.5–15.5)
WBC: 8.7 K/uL (ref 4.0–10.5)
nRBC: 0 % (ref 0.0–0.2)

## 2024-07-22 LAB — COMPREHENSIVE METABOLIC PANEL WITH GFR
ALT: 45 U/L — ABNORMAL HIGH (ref 0–44)
AST: 35 U/L (ref 15–41)
Albumin: 4.3 g/dL (ref 3.5–5.0)
Alkaline Phosphatase: 51 U/L (ref 38–126)
Anion gap: 13 (ref 5–15)
BUN: 27 mg/dL — ABNORMAL HIGH (ref 8–23)
CO2: 23 mmol/L (ref 22–32)
Calcium: 10.1 mg/dL (ref 8.9–10.3)
Chloride: 106 mmol/L (ref 98–111)
Creatinine, Ser: 1.77 mg/dL — ABNORMAL HIGH (ref 0.61–1.24)
GFR, Estimated: 40 mL/min — ABNORMAL LOW (ref >60)
Glucose, Bld: 101 mg/dL — ABNORMAL HIGH (ref 70–99)
Potassium: 4.3 mmol/L (ref 3.5–5.1)
Sodium: 142 mmol/L (ref 135–145)
Total Bilirubin: 0.3 mg/dL (ref 0.0–1.2)
Total Protein: 7.8 g/dL (ref 6.5–8.1)

## 2024-07-22 LAB — LACTIC ACID, PLASMA: Lactic Acid, Venous: 1.8 mmol/L (ref 0.5–1.9)

## 2024-07-22 MED ORDER — DOXYCYCLINE HYCLATE 100 MG PO CAPS: 100.0000 mg | ORAL_CAPSULE | Freq: Two times a day (BID) | ORAL | 0 refills | Status: DC

## 2024-07-22 MED ORDER — DOXYCYCLINE HYCLATE 100 MG PO TABS
100.0000 mg | ORAL_TABLET | Freq: Once | ORAL | Status: AC
Start: 2024-07-22 — End: 2024-07-22
  Administered 2024-07-22: 100 mg via ORAL
  Filled 2024-07-22: qty 1

## 2024-07-22 NOTE — ED Triage Notes (Signed)
 Pt POV with personal rolling walker-  reports dropping grinder on R knee a week ago. C/o increased soreness, redness, swelling, dehiscence of wound. Reports clear drainage.

## 2024-07-22 NOTE — ED Notes (Signed)
 Bandage and nonadherant gauze placed over 2 laceration to knee. Green and red skin around region with no strong odor. Various stages of healing across wound.

## 2024-07-22 NOTE — ED Provider Notes (Signed)
 [facility] Provider Note   CSN: [id number] Arrival date & time: 07/22/24  1255     Patient presents with: Wound Check   Garrett [name] is a 73 y.o. male.   Patient presents to the emergency department today for evaluation of right lower extremity wound.  Patient states that he dropped an angle grinder on his knee about 1 week ago.  He had a wound that he bandaged.  He did treat it with peroxide and topical medication.  Over the past couple of days, he noted increased redness and tenderness.  Patient has had some clear drainage.  He has been keeping a bandage.  No fever, nausea or vomiting.  Reports tetanus is up-to-date.       Prior to Admission medications   Medication Sig Start Date End Date Taking? Authorizing Provider  doxycycline  (VIBRAMYCIN ) 100 MG capsule Take 1 capsule (100 mg total) by mouth 2 (two) times daily. 07/22/24  Yes Desiderio Chew, PA-C  albuterol  (PROAIR  HFA) 108 (90 Base) MCG/ACT inhaler Inhale 1 puff into the lungs every 6 (six) hours as needed for wheezing or shortness of breath. 08/05/23   Antonio Cyndee Jamee [name], DO  allopurinol  (ZYLOPRIM ) 100 MG tablet Take 1 tablet (100 mg total) by mouth daily. 04/30/24   Antonio Cyndee Jamee [name], DO  atorvastatin  (LIPITOR ) 80 MG tablet Take 1 tablet (80 mg total) by mouth daily. 04/30/24     celecoxib  (CELEBREX ) 200 MG capsule Take 1 capsule (200 mg total) by mouth 2 (two) times daily. 04/15/23   Antonio Cyndee Jamee [name], DO  EPINEPHrine  0.3 mg/0.3 mL IJ SOAJ injection Inject 0.3 mg into the muscle as needed. 09/08/20   [provider]  fenofibrate  160 MG tablet Take 1 tablet (160 mg total) by mouth daily. 08/29/23   Lowne Chase, Yvonne R, DO  Ferrous Sulfate (IRON PO) Take 1 tablet by mouth daily.    [provider]  fluticasone  (FLONASE ) 50 MCG/ACT nasal spray Place 2 sprays into both nostrils daily. 09/10/19   Antonio Cyndee Jamee [name], DO  furosemide  (LASIX ) 40 MG tablet Take 1  tablet (40 mg total) by mouth 2 (two) times daily. 04/30/24     gabapentin  (NEURONTIN ) 100 MG capsule Take 2 capsules (200 mg total) by mouth at bedtime. 03/09/24   Antonio Cyndee Jamee R, DO  glucose blood (ONETOUCH ULTRA) test strip USE AS DIRECTED 3 TIMES  DAILY 05/27/23   Antonio Cyndee, Jamee R, DO  levocetirizine (XYZAL ) 5 MG tablet Take 1 tablet (5 mg total) by mouth every evening. 04/30/24   Antonio Cyndee Jamee [name], DO  memantine  (NAMENDA ) 10 MG tablet Take 2 tablets (20 mg total) by mouth daily. 05/28/24   Antonio Cyndee Jamee [name], DO  metFORMIN  (GLUCOPHAGE -XR) 500 MG 24 hr tablet Take 2 tablets (1,000 mg total) by mouth daily. 05/24/24   Antonio Cyndee Jamee [name], DO  Metoprolol  Tartrate 75 MG TABS Take 1 tablet (75 mg total) by mouth 2 (two) times daily. 11/14/23     mometasone -formoterol  (DULERA ) 100-5 MCG/ACT AERO Inhale 1-2 puffs into the lungs 2 (two) times daily. 08/08/23     Multiple Vitamins-Minerals (ONE-A-DAY WEIGHT SMART ADVANCE PO) Take 1 tablet by mouth daily. Centrum Silver    [provider]  nitroGLYCERIN  (NITROSTAT ) 0.4 MG SL tablet Place 1 tablet under the tongue every 5 minutes as needed for chest pain. 03/31/24   Antonio Cyndee Jamee [name], DO  omeprazole  (PRILOSEC) 40 MG capsule Take  1 capsule (40 mg total) by mouth 2 (two) times daily shortly before a meal (breakfast and dinner). 03/31/24   Antonio Cyndee Jamee [name], DO  potassium chloride  SA (KLOR-CON  M) 20 MEQ tablet Take 2 tablets (40 mEq total) by mouth daily. 04/30/24   Antonio Cyndee Jamee [name], DO  potassium citrate  (UROCIT-K ) 10 MEQ (1080 MG) SR tablet Take 1 tablet (10 mEq total) by mouth 3 (three) times daily. 05/12/24     rivaroxaban  (XARELTO ) 10 MG TABS tablet Take 1 tablet (10 mg total) by mouth daily. 05/28/24   Antonio Cyndee Jamee R, DO  Semaglutide ,0.25 or 0.5MG /DOS, (OZEMPIC , 0.25 OR 0.5 MG/DOSE,) 2 MG/3ML SOPN Inject 0.5 mg into the skin once a week. Sunday 04/01/24   Antonio Cyndee Jamee [name], DO  sildenafil  (VIAGRA ) 100 MG tablet Take 1  tablet (100 mg total) by mouth daily as needed. 05/12/24     tadalafil  (CIALIS ) 20 MG tablet Take 1 tablet (20 mg total) by mouth daily as needed. 05/12/24     topiramate  (TOPAMAX ) 50 MG tablet Take 1 tablet (50 mg total) by mouth 2 (two) times daily. 03/31/24   Georjean Darice HERO, MD  vitamin B-12 (CYANOCOBALAMIN ) 1000 MCG tablet Take 1,000 mcg by mouth daily.    [provider]    Allergies: Bee venom and Garlic    Review of Systems  Updated Vital Signs BP (!) 100/53   Pulse 82   Temp 98.1 F (36.7 C)   Resp 16   Ht 5' 9 (1.753 m)   Wt 90.7 kg   SpO2 94%   BMI 29.53 kg/m   Physical Exam Vitals and nursing note reviewed.  Constitutional:      Appearance: He is well-developed.  HENT:     Head: Normocephalic and atraumatic.  Eyes:     Conjunctiva/sclera: Conjunctivae normal.  Pulmonary:     Effort: No respiratory distress.  Musculoskeletal:     Cervical back: Normal range of motion and neck supple.     Left knee: No effusion. Normal range of motion. Tenderness (mild) present.     Comments: 3 cm laceration over the right knee.  There is a scant amount of clear drainage.  There is some surrounding redness, no lymphangitis.  Skin:    General: Skin is warm and dry.  Neurological:     Mental Status: He is alert.    \  (all labs ordered are listed, but only abnormal results are displayed) Labs Reviewed  COMPREHENSIVE METABOLIC PANEL WITH GFR - Abnormal; Notable for the following components:      Result Value   Glucose, Bld 101 (*)    BUN 27 (*)    Creatinine, Ser 1.77 (*)    ALT 45 (*)    GFR, Estimated 40 (*)    All other components within normal limits  CBC WITH DIFFERENTIAL/PLATELET - Abnormal; Notable for the following components:   Hemoglobin 12.4 (*)    HCT 38.4 (*)    All other components within normal limits  LACTIC ACID, PLASMA    EKG: None  Radiology: DG Knee Complete 4 Views Right Result Date: 07/22/2024 CLINICAL DATA:  Right knee pain and  swelling after dropping object on the knee EXAM: RIGHT KNEE - COMPLETE 4 VIEW COMPARISON:  Radiograph of the knees dated 05/20/2023 FINDINGS: Partially imaged postsurgical changes of the right tibia. Intramedullary rod and transverse screws appear intact without abnormal surrounding lucency. There are no findings of fracture or dislocation. Small joint effusion. Moderate tricompartmental degenerative  changes of the knee. Soft tissues are unremarkable. IMPRESSION: 1. No acute fracture or dislocation. 2. Small joint effusion. 3. Moderate tricompartmental degenerative changes of the knee. Electronically Signed   By: Limin  Xu M.D.   On: 07/22/2024 13:55     Procedures   Medications Ordered in the ED  doxycycline  (VIBRA -TABS) tablet 100 mg (100 mg Oral Given 07/22/24 1456)   ED Course  Patient seen and examined. History obtained directly from patient. Work-up including labs, imaging, EKG ordered in triage, if performed, were reviewed.    Labs/EKG: Independently reviewed and interpreted.  This included: CBC with normal white blood cell count, hemoglobin anemia at baseline; CMP creatinine elevated at 1.77 but at baseline, BUN 27; lactate is normal at 1.8.  Imaging: Independently visualized and interpreted.  This included: X-ray of the knee shows postsurgical change of the right tibia with IM rod, small joint effusion.  Medications/Fluids: Ordered: PO doxycycline   Most recent vital signs reviewed and are as follows: BP (!) 100/53   Pulse 82   Temp 98.1 F (36.7 C)   Resp 16   Ht 5' 9 (1.753 m)   Wt 90.7 kg   SpO2 94%   BMI 29.53 kg/m   Initial impression: Right lower extremity wound with soft tissue infection, some of the changes in the knee are chronic from previous surgical procedures.  3:06 PM Reassessment performed. Patient appears stable.  Tetanus up-to-date.  Plan: Discharge to home.   Prescriptions written for: Doxycycline   Other home care instructions discussed: Discussed  wound care, avoidance of peroxide at this point  ED return instructions discussed: Pt urged to return with worsening pain, worsening swelling, expanding area of redness or streaking up extremity, fever, or any other concerns.  Urged to take complete course of antibiotics as prescribed.   Follow-up instructions discussed: Patient encouraged to follow-up with their PCP in 2-3 days.                                    Medical Decision Making Amount and/or Complexity of Data Reviewed Labs: ordered. Radiology: ordered.  Risk Prescription drug management.   Patient with superficial laceration sustained 1 week ago over the right knee, patient with increasing redness and tenderness since that time.  Patient has normal range of motion of the knee.  No large joint effusion.  No systemic symptoms.  Lab workup ordered in triage, was reassuring.  Will start on oral doxycycline .  Do not suspect septic arthritis, osteomyelitis, or deep abscess at this time.     Final diagnoses:  Wound infection    ED Discharge Orders          Ordered    doxycycline  (VIBRAMYCIN ) 100 MG capsule  2 times daily        07/22/24 1458               Desiderio Chew, PA-C 07/22/24 1513    Towana Ozell BROCKS, MD 07/23/24 [phone]

## 2024-07-22 NOTE — Discharge Instructions (Signed)
 Please read and follow all provided instructions.  Your diagnoses today include:  1. Wound infection     Tests performed today include: Vital signs. See below for your results today.  Complete blood cell count: Normal white blood cell count, stable mild anemia Complete metabolic panel: Weak kidney function but stable at baseline Lactic acid test: Test for more severe infection, was normal X-ray does not show fracture or dislocation  Medications prescribed:  Doxycycline  - antibiotic  You have been prescribed an antibiotic medicine: take the entire course of medicine even if you are feeling better. Stopping early can cause the antibiotic not to work.  Take any prescribed medications only as directed.   Home care instructions:  Follow any educational materials contained in this packet. Keep affected area above the level of your heart when possible. Wash area gently twice a day with warm soapy water . Do not apply alcohol or hydrogen peroxide. Cover the area if it draining or weeping.   Follow-up instructions: Please see your doctor in the next 40 to 72 hours for recheck.  Return to the emergency department with any worsening.  Return instructions:  Return to the Emergency Department if you have: Fever Worsening symptoms Worsening pain Worsening swelling Redness of the skin that moves away from the affected area, especially if it streaks away from the affected area  Any other emergent concerns  Your vital signs today were: BP (!) 100/53   Pulse 82   Temp 98.1 F (36.7 C)   Resp 16   Ht 5' 9 (1.753 m)   Wt 90.7 kg   SpO2 94%   BMI 29.53 kg/m  If your blood pressure (BP) was elevated above 135/85 this visit, please have this repeated by your doctor within one month. --------------

## 2024-07-26 ENCOUNTER — Telehealth: Payer: Self-pay | Admitting: Podiatry

## 2024-07-26 NOTE — Telephone Encounter (Signed)
 Patient is calling for status of his DM shoes ordered back in March. Thanks

## 2024-07-27 ENCOUNTER — Ambulatory Visit: Admitting: Family Medicine

## 2024-07-27 ENCOUNTER — Other Ambulatory Visit: Payer: Self-pay | Admitting: Family Medicine

## 2024-07-27 ENCOUNTER — Other Ambulatory Visit (HOSPITAL_COMMUNITY): Payer: Self-pay

## 2024-07-27 VITALS — BP 137/86 | HR 92 | Resp 18 | Ht 69.0 in | Wt 201.5 lb

## 2024-07-27 DIAGNOSIS — Z91030 Bee allergy status: Secondary | ICD-10-CM

## 2024-07-27 DIAGNOSIS — S81001A Unspecified open wound, right knee, initial encounter: Secondary | ICD-10-CM | POA: Diagnosis not present

## 2024-07-27 DIAGNOSIS — R159 Full incontinence of feces: Secondary | ICD-10-CM

## 2024-07-27 MED ORDER — NEFFY 2 MG/0.1ML NA SOLN
2.0000 mg | Freq: Once | NASAL | 1 refills | Status: DC | PRN
Start: 2024-07-27 — End: 2024-07-27
  Filled 2024-07-27: qty 2, 1d supply, fill #0

## 2024-07-27 MED ORDER — EPINEPHRINE 0.3 MG/0.3ML IJ SOAJ: 0.3000 mg | INTRAMUSCULAR | 2 refills | Status: AC | PRN

## 2024-07-27 MED ORDER — DOXYCYCLINE HYCLATE 100 MG PO TABS
100.0000 mg | ORAL_TABLET | Freq: Two times a day (BID) | ORAL | 0 refills | Status: DC
Start: 2024-07-27 — End: 2024-08-12
  Filled 2024-07-27: qty 20, 10d supply, fill #0
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
## 2024-07-28 ENCOUNTER — Encounter: Payer: Self-pay | Admitting: Family Medicine

## 2024-07-28 ENCOUNTER — Other Ambulatory Visit: Payer: Self-pay

## 2024-07-28 DIAGNOSIS — R159 Full incontinence of feces: Secondary | ICD-10-CM | POA: Insufficient documentation

## 2024-07-28 DIAGNOSIS — S81001A Unspecified open wound, right knee, initial encounter: Secondary | ICD-10-CM | POA: Insufficient documentation

## 2024-07-28 NOTE — Assessment & Plan Note (Signed)
 Refill epi pen

## 2024-07-28 NOTE — Assessment & Plan Note (Signed)
 Abx-- doxycycline  Wound culture done  Keep area clean

## 2024-07-28 NOTE — Assessment & Plan Note (Signed)
 Refer to GI

## 2024-07-29 NOTE — Telephone Encounter (Signed)
 [employer] are here waiting on prior auth  Ppwk exp 08/24/24

## 2024-07-30 LAB — WOUND CULTURE
AMOXICILLIN/CLAVULANATE: >=32 — AB
CEFAZOLIN: >=32 — AB
CEFEPIME: <=0.12 — AB
Ceftazidime: <=0.5 — AB
Ciprofloxacin: <=0.06 — AB
Gentamicin Susc lslt: <=1 — AB
Imipenem: 0.5 — AB
LEVOFLOXACIN: <=0.12 — AB
Meropenem: <=0.25 — AB
Piperacillin + Tazobactam: <=4 — AB
Trimethoprim/Sulfa: <=20 — AB

## 2024-07-31 LAB — WOUND CULTURE
MICRO NUMBER:: 16789033
SPECIMEN QUALITY:: ADEQUATE

## 2024-08-01 ENCOUNTER — Ambulatory Visit: Payer: Self-pay | Admitting: Family Medicine

## 2024-08-04 ENCOUNTER — Telehealth: Payer: Self-pay

## 2024-08-04 NOTE — Telephone Encounter (Signed)
 Prior auth resent 8/13.SABRA never recvd auth back from when I sent it on 8/7

## 2024-08-06 ENCOUNTER — Other Ambulatory Visit: Payer: Self-pay | Admitting: Urology

## 2024-08-06 NOTE — Telephone Encounter (Signed)
 Resent prior shara (previous shara had a lack of information)

## 2024-08-09 ENCOUNTER — Telehealth: Payer: Self-pay | Admitting: Family Medicine

## 2024-08-09 NOTE — Telephone Encounter (Signed)
 Pt needs a pre-op appt please

## 2024-08-09 NOTE — Telephone Encounter (Signed)
 Copied from CRM #[id number]. Topic: General - Other >> Aug 09, 2024  9:27 AM [place] E wrote: Reason for CRM: Zona, with [facility], stated she faxed over a surgical clearance form for the patient on 8/15 as he is having surgery on 9/23. Callback number for Candie is [phone] ext. [phone].

## 2024-08-09 NOTE — Telephone Encounter (Signed)
 Lvm for pt to call back to schedule

## 2024-08-10 NOTE — Telephone Encounter (Signed)
 [facility] PA rcvd and approved  Auth #[id number]  08/04/24-11/02/24  Indexed in media

## 2024-08-11 ENCOUNTER — Ambulatory Visit (INDEPENDENT_AMBULATORY_CARE_PROVIDER_SITE_OTHER)

## 2024-08-11 DIAGNOSIS — E1149 Type 2 diabetes mellitus with other diabetic neurological complication: Secondary | ICD-10-CM | POA: Diagnosis not present

## 2024-08-11 DIAGNOSIS — M2142 Flat foot [pes planus] (acquired), left foot: Secondary | ICD-10-CM | POA: Diagnosis not present

## 2024-08-11 DIAGNOSIS — M2141 Flat foot [pes planus] (acquired), right foot: Secondary | ICD-10-CM

## 2024-08-11 NOTE — Progress Notes (Signed)

## 2024-08-12 ENCOUNTER — Ambulatory Visit (INDEPENDENT_AMBULATORY_CARE_PROVIDER_SITE_OTHER): Admitting: Family Medicine

## 2024-08-12 ENCOUNTER — Encounter: Payer: Self-pay | Admitting: Family Medicine

## 2024-08-12 VITALS — BP 110/70 | HR 81 | Temp 98.1°F | Resp 18 | Ht 69.0 in | Wt 198.0 lb

## 2024-08-12 DIAGNOSIS — Z01818 Encounter for other preprocedural examination: Secondary | ICD-10-CM

## 2024-08-12 NOTE — Progress Notes (Signed)
 Subjective:    Garrett [name] is a 73 y.o. male who presents to the office today for a preoperative consultation at the request of surgeon Dr Lovie  who plans on performing penile prosthetic  on September 23. This consultation is requested for the specific conditions prompting preoperative evaluation (i.e. because of potential affect on operative risk): blood thinners. Planned anesthesia: IV sedation. The patient has the following known anesthesia issues: none. Patients bleeding risk: no recent abnormal bleeding and  . Patient does not have objections to receiving blood products if needed.  The following portions of the patient's history were reviewed and updated as appropriate: allergies, current medications, past family history, past medical history, past social history, past surgical history, and problem list.  Review of Systems .  Review of Systems  Constitutional: Negative for activity change, appetite change and fatigue.  HENT: Negative for hearing loss, congestion, tinnitus and ear discharge.  dentist q68m Eyes: Negative for visual disturbance ( Respiratory: Negative for cough, chest tightness and shortness of breath.   Cardiovascular: Negative for chest pain, palpitations and leg swelling.  Gastrointestinal: Negative for abdominal pain, diarrhea, constipation and abdominal distention.  Genitourinary: Negative for urgency, frequency, decreased urine volume and difficulty urinating.  Musculoskeletal: Negative for back pain, arthralgias and gait problem.  Skin: Negative for color change, pallor and rash.  Neurological: Negative for dizziness, light-headedness, numbness and headaches.  Hematological: Negative for adenopathy. Does not bruise/bleed easily.  Psychiatric/Behavioral: Negative for suicidal ideas, confusion, sleep disturbance, self-injury, dysphoric mood, decreased concentration and agitation.           Objective:    BP 110/70 (BP Location: Left Arm, Patient Position:  Sitting, Cuff Size: Normal)   Pulse 81   Temp 98.1 F (36.7 C) (Oral)   Resp 18   Ht 5' 9 (1.753 m)   Wt 198 lb (89.8 kg)   SpO2 99%   BMI 29.24 kg/m  General appearance: alert, cooperative, appears stated age, and no distress Head: Normocephalic, without obvious abnormality, atraumatic Eyes: conjunctivae/corneas clear. PERRL, EOM's intact. Fundi benign. Ears: normal TM's and external ear canals both ears Nose: Nares normal. Septum midline. Mucosa normal. No drainage or sinus tenderness. Throat: lips, mucosa, and tongue normal; teeth and gums normal Neck: no adenopathy, no carotid bruit, no JVD, supple, symmetrical, trachea midline, and thyroid  not enlarged, symmetric, no tenderness/mass/nodules Back: symmetric, no curvature. ROM normal. No CVA tenderness. Lungs: clear to auscultation bilaterally Chest wall: no tenderness Heart: regular rate and rhythm, S1, S2 normal, no murmur, click, rub or gallop Abdomen: soft, non-tender; bowel sounds normal; no masses,  no organomegaly Extremities: extremities normal, atraumatic, no cyanosis or edema Pulses: 2+ and symmetric Skin: Skin color, texture, turgor normal. No rashes or lesions Lymph nodes: Cervical, supraclavicular, and axillary nodes normal. Neurologic: Grossly normal   Lab Review  Office Visit on 07/27/2024  Component Date Value   MICRO NUMBER: 07/27/2024 [id number]    SPECIMEN QUALITY: 07/27/2024 Adequate    SOURCE: 07/27/2024 WOUND (SITE NOT SPECIFIED)    STATUS: 07/27/2024 FINAL    GRAM STAIN: 07/27/2024 No epithelial cells seen No white blood cells seen Many Gram negative bacilli    ISOLATE 1: 07/27/2024 methicillin resistant Staphylococcus aureus (A)    ISOLATE 2: 07/27/2024 Enterobacter cloacae (A)   Admission on 07/22/2024, Discharged on 07/22/2024  Component Date Value   Lactic Acid, Venous 07/22/2024 1.8    Sodium 07/22/2024 142    Potassium 07/22/2024 4.3    Chloride 07/22/2024 106  CO2 07/22/2024 23     Glucose, Bld 07/22/2024 101 (H)    BUN 07/22/2024 27 (H)    Creatinine, Ser 07/22/2024 1.77 (H)    Calcium  07/22/2024 10.1    Total Protein 07/22/2024 7.8    Albumin  07/22/2024 4.3    AST 07/22/2024 35    ALT 07/22/2024 45 (H)    Alkaline Phosphatase 07/22/2024 51    Total Bilirubin 07/22/2024 0.3    GFR, Estimated 07/22/2024 40 (L)    Anion gap 07/22/2024 13    WBC 07/22/2024 8.7    RBC 07/22/2024 4.23    Hemoglobin 07/22/2024 12.4 (L)    HCT 07/22/2024 38.4 (L)    MCV 07/22/2024 90.8    MCH 07/22/2024 29.3    MCHC 07/22/2024 32.3    RDW 07/22/2024 15.3    Platelets 07/22/2024 370    nRBC 07/22/2024 0.0    Neutrophils Relative % 07/22/2024 72    Neutro Abs 07/22/2024 6.3    Lymphocytes Relative 07/22/2024 18    Lymphs Abs 07/22/2024 1.5    Monocytes Relative 07/22/2024 8    Monocytes Absolute 07/22/2024 0.7    Eosinophils Relative 07/22/2024 1    Eosinophils Absolute 07/22/2024 0.1    Basophils Relative 07/22/2024 1    Basophils Absolute 07/22/2024 0.1    Immature Granulocytes 07/22/2024 0    Abs Immature Granulocytes 07/22/2024 0.03   Office Visit on 06/29/2024  Component Date Value   Microalb, Ur 06/29/2024 1.1    Creatinine,U 06/29/2024 91.6    Microalb Creat Ratio 06/29/2024 12.3   Refill on 05/27/2024  Component Date Value   HM Diabetic Eye Exam 02/20/2024 No Retinopathy   Office Visit on 05/24/2024  Component Date Value   Cholesterol 05/24/2024 132    Triglycerides 05/24/2024 210.0 (H)    HDL 05/24/2024 35.00 (L)    VLDL 05/24/2024 42.0 (H)    LDL Cholesterol 05/24/2024 55    Total CHOL/HDL Ratio 05/24/2024 4    NonHDL 05/24/2024 97.46    WBC 05/24/2024 7.7    RBC 05/24/2024 4.22    Hemoglobin 05/24/2024 12.4 (L)    HCT 05/24/2024 37.4 (L)    MCV 05/24/2024 88.6    MCHC 05/24/2024 33.1    RDW 05/24/2024 16.6 (H)    Platelets 05/24/2024 304.0    Neutrophils Relative % 05/24/2024 68.2    Lymphocytes Relative 05/24/2024 20.3    Monocytes Relative  05/24/2024 8.9    Eosinophils Relative 05/24/2024 2.2    Basophils Relative 05/24/2024 0.4    Neutro Abs 05/24/2024 5.3    Lymphs Abs 05/24/2024 1.6    Monocytes Absolute 05/24/2024 0.7    Eosinophils Absolute 05/24/2024 0.2    Basophils Absolute 05/24/2024 0.0    Sodium 05/24/2024 139    Potassium 05/24/2024 3.9    Chloride 05/24/2024 103    CO2 05/24/2024 24    Glucose, Bld 05/24/2024 119 (H)    BUN 05/24/2024 31 (H)    Creatinine, Ser 05/24/2024 2.05 (H)    Total Bilirubin 05/24/2024 0.5    Alkaline Phosphatase 05/24/2024 43    AST 05/24/2024 31    ALT 05/24/2024 36    Total Protein 05/24/2024 7.0    Albumin  05/24/2024 4.3    GFR 05/24/2024 31.42 (L)    Calcium  05/24/2024 9.6    Hgb A1c MFr Bld 05/24/2024 6.9 (H)       Assessment:      73 y.o. male with planned surgery as above.   Known risk factors for perioperative  complications: hx dm, on blood thinners.  Current medications which may produce withdrawal symptoms if withheld perioperatively: na    Plan:    1. Preoperative workup as follows [id number]. Change in medication regimen before surgery: per surgical team. 3. Prophylaxis for cardiac events with perioperative beta-blockers: not indicated. 4. Invasive hemodynamic monitoring perioperatively: not indicated. 5. Deep vein thrombosis prophylaxis postoperatively:regimen to be chosen by surgical team. 6. Surveillance for postoperative MI with ECG immediately postoperatively and on postoperative days 1 and 2 AND troponin levels 24 hours postoperatively and on day 4 or hospital discharge (whichever comes first): not indicated. 7. Other measures: na

## 2024-08-16 ENCOUNTER — Other Ambulatory Visit (HOSPITAL_COMMUNITY): Payer: Self-pay

## 2024-08-16 ENCOUNTER — Other Ambulatory Visit: Payer: Self-pay | Admitting: Family Medicine

## 2024-08-16 ENCOUNTER — Other Ambulatory Visit: Payer: Self-pay

## 2024-08-16 DIAGNOSIS — M1A10X0 Lead-induced chronic gout, unspecified site, without tophus (tophi): Secondary | ICD-10-CM

## 2024-08-16 MED ORDER — MEMANTINE HCL 10 MG PO TABS
20.0000 mg | ORAL_TABLET | Freq: Every day | ORAL | 1 refills | Status: AC
  Filled 2024-08-16: qty 180, 90d supply, fill #0
  Filled 2024-11-19: qty 180, 90d supply, fill #1

## 2024-08-16 MED ORDER — GABAPENTIN 100 MG PO CAPS
200.0000 mg | ORAL_CAPSULE | Freq: Every day | ORAL | 1 refills | Status: AC
  Filled 2024-08-16: qty 180, 90d supply, fill #0
  Filled 2024-12-06: qty 180, 90d supply, fill #1

## 2024-08-16 MED ORDER — ALLOPURINOL 100 MG PO TABS
100.0000 mg | ORAL_TABLET | Freq: Every day | ORAL | 1 refills | Status: AC
  Filled 2024-08-16: qty 90, 90d supply, fill #0
  Filled 2024-11-10: qty 90, 90d supply, fill #1

## 2024-08-17 ENCOUNTER — Other Ambulatory Visit: Payer: Self-pay

## 2024-08-19 ENCOUNTER — Other Ambulatory Visit: Payer: Self-pay | Admitting: Family Medicine

## 2024-08-19 ENCOUNTER — Other Ambulatory Visit: Payer: Self-pay

## 2024-08-19 ENCOUNTER — Other Ambulatory Visit (HOSPITAL_COMMUNITY): Payer: Self-pay

## 2024-08-19 DIAGNOSIS — I2699 Other pulmonary embolism without acute cor pulmonale: Secondary | ICD-10-CM

## 2024-08-20 ENCOUNTER — Other Ambulatory Visit: Payer: Self-pay

## 2024-08-20 ENCOUNTER — Encounter: Payer: Self-pay | Admitting: Neurology

## 2024-08-20 ENCOUNTER — Other Ambulatory Visit (HOSPITAL_COMMUNITY): Payer: Self-pay

## 2024-08-20 ENCOUNTER — Ambulatory Visit (INDEPENDENT_AMBULATORY_CARE_PROVIDER_SITE_OTHER): Admitting: Neurology

## 2024-08-20 VITALS — BP 131/80 | HR 81 | Resp 20 | Ht 69.0 in | Wt 200.0 lb

## 2024-08-20 DIAGNOSIS — G629 Polyneuropathy, unspecified: Secondary | ICD-10-CM

## 2024-08-20 DIAGNOSIS — M5416 Radiculopathy, lumbar region: Secondary | ICD-10-CM | POA: Diagnosis not present

## 2024-08-20 DIAGNOSIS — G43109 Migraine with aura, not intractable, without status migrainosus: Secondary | ICD-10-CM | POA: Diagnosis not present

## 2024-08-20 MED ORDER — TOPIRAMATE 50 MG PO TABS
50.0000 mg | ORAL_TABLET | Freq: Two times a day (BID) | ORAL | 4 refills | Status: AC
  Filled 2024-08-20: qty 200, 100d supply, fill #0
  Filled 2024-12-06: qty 200, 100d supply, fill #1
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
## 2024-08-20 NOTE — Patient Instructions (Signed)
 It's always a pleasure to see you. Continue all your medications. Wishing you well with the upcoming spinal cord stimulator surgery. Follow-up in 1 year, call for any changes.

## 2024-08-22 ENCOUNTER — Other Ambulatory Visit (HOSPITAL_COMMUNITY): Payer: Self-pay

## 2024-08-22 MED ORDER — RIVAROXABAN 10 MG PO TABS
10.0000 mg | ORAL_TABLET | Freq: Every day | ORAL | 1 refills | Status: AC
  Filled 2024-08-22: qty 90, 90d supply, fill #0

## 2024-08-23 ENCOUNTER — Other Ambulatory Visit: Payer: Self-pay

## 2024-08-24 ENCOUNTER — Other Ambulatory Visit (HOSPITAL_COMMUNITY): Payer: Self-pay

## 2024-08-24 ENCOUNTER — Other Ambulatory Visit: Payer: Self-pay

## 2024-08-25 ENCOUNTER — Telehealth: Payer: Self-pay | Admitting: Gastroenterology

## 2024-08-25 NOTE — Telephone Encounter (Addendum)
 Procedure:Endoscopy  Procedure date: 09/02/24 Procedure location: [facility] Arrival Time: 6:00 am Spoke with the patient Y/N: Yes Any prep concerns? No Has the patient obtained the prep from the pharmacy ? No prep needed Do you have a care partner and transportation: Yes Any additional concerns? No

## 2024-08-27 ENCOUNTER — Ambulatory Visit: Admitting: Neurology

## 2024-09-01 ENCOUNTER — Telehealth (HOSPITAL_COMMUNITY): Payer: Self-pay

## 2024-09-01 NOTE — Telephone Encounter (Signed)
 Spoke with pt.  Chart was reviewed and noted that his procedure prep instructions were sent to him on 06/20/2024. Pt was notified that we would place him on Dr. Charlanne Salvage wait list.  Pt verbalized understanding with all questions answered.

## 2024-09-01 NOTE — Telephone Encounter (Signed)
 Garrett [name] was scheduled for Esophagogastroduodenoscopy with Dr. Charlanne on 09/02/2024, at [facility].   Called patient on 09/01/2024 to go over pre-operative instructions. Pt states he had not received letter with pre-op instructions. States he took his Ozempic  on 08/29/2024. States he took Xarelto  on 09/01/24. Advised pt that we would need to cancel his procedure d/t not holding Xarelto  and Ozempic .  Dr Charlanne & office notified. Patient instructed to call physician's office to reschedule their procedure. Patient demonstrated understanding.  Garrett VEAR Pouch, RN 09/01/24 9:10 AM

## 2024-09-02 ENCOUNTER — Encounter (HOSPITAL_COMMUNITY): Admission: RE | Payer: Self-pay | Source: Home / Self Care

## 2024-09-02 ENCOUNTER — Ambulatory Visit (HOSPITAL_COMMUNITY): Admission: RE | Admit: 2024-09-02 | Source: Home / Self Care | Admitting: Gastroenterology

## 2024-09-02 DIAGNOSIS — K219 Gastro-esophageal reflux disease without esophagitis: Secondary | ICD-10-CM | POA: Insufficient documentation

## 2024-09-02 DIAGNOSIS — R1310 Dysphagia, unspecified: Secondary | ICD-10-CM

## 2024-09-02 SURGERY — EGD (ESOPHAGOGASTRODUODENOSCOPY)
Anesthesia: Monitor Anesthesia Care

## 2024-09-02 NOTE — Telephone Encounter (Signed)
 PCP sent in a referral to our office on 07/27/24 for full incontinence of feces. Pt stated he has frequent loose stools & incontinence at least every other day. He takes imodium every morning which provides some relief, but not entirely. He also has complaints of LUQ pain & reflux. He's currently on hospital waitlist to be rescheduled for EGD. Advised patient I'd discuss recent symptoms with Dr. Charlanne & be back in touch for rescheduling.

## 2024-09-02 NOTE — Telephone Encounter (Signed)
 Lets make sure he is not taking any magnesium  or multivitamins causing diarrhea, as before Check stool for GI pathogens, calprotectin Please have him come in for OV.  Then we can set him up for EGD at hospital (+/- colon If needed) RG

## 2024-09-03 ENCOUNTER — Other Ambulatory Visit (HOSPITAL_COMMUNITY): Payer: Self-pay

## 2024-09-03 ENCOUNTER — Other Ambulatory Visit: Payer: Self-pay

## 2024-09-03 DIAGNOSIS — R197 Diarrhea, unspecified: Secondary | ICD-10-CM

## 2024-09-03 NOTE — Telephone Encounter (Signed)
 Left message for patient to call back. Stool orders placed.

## 2024-09-03 NOTE — Telephone Encounter (Signed)
 Patient made aware of MD recommendations. He's not currently taking magnesium . OV scheduled for 9/18 at 1:30 pm with Alan, [name]. He will come by & pick up stool kit at earliest convenience, and has been advised on where to go. Pt verbalized all understanding.

## 2024-09-09 ENCOUNTER — Telehealth: Payer: Self-pay

## 2024-09-09 ENCOUNTER — Ambulatory Visit: Admitting: Physician Assistant

## 2024-09-09 ENCOUNTER — Ambulatory Visit (INDEPENDENT_AMBULATORY_CARE_PROVIDER_SITE_OTHER)
Admission: RE | Admit: 2024-09-09 | Discharge: 2024-09-09 | Disposition: A | Discharge status: Home / Self Care | Source: Ambulatory Visit | Attending: Physician Assistant | Admitting: Physician Assistant

## 2024-09-09 ENCOUNTER — Other Ambulatory Visit (HOSPITAL_COMMUNITY): Payer: Self-pay

## 2024-09-09 ENCOUNTER — Encounter: Payer: Self-pay | Admitting: Physician Assistant

## 2024-09-09 VITALS — BP 124/66 | HR 81 | Ht 69.0 in | Wt 194.0 lb

## 2024-09-09 DIAGNOSIS — R1310 Dysphagia, unspecified: Secondary | ICD-10-CM

## 2024-09-09 DIAGNOSIS — K219 Gastro-esophageal reflux disease without esophagitis: Secondary | ICD-10-CM

## 2024-09-09 DIAGNOSIS — Z860100 Personal history of colon polyps, unspecified: Secondary | ICD-10-CM

## 2024-09-09 DIAGNOSIS — R194 Change in bowel habit: Secondary | ICD-10-CM

## 2024-09-09 DIAGNOSIS — R159 Full incontinence of feces: Secondary | ICD-10-CM

## 2024-09-09 DIAGNOSIS — R197 Diarrhea, unspecified: Secondary | ICD-10-CM

## 2024-09-09 DIAGNOSIS — I5032 Chronic diastolic (congestive) heart failure: Secondary | ICD-10-CM

## 2024-09-09 MED ORDER — NA SULFATE-K SULFATE-MG SULF 17.5-3.13-1.6 GM/177ML PO SOLN: 1.0000 | Freq: Once | ORAL | 0 refills | Status: AC

## 2024-09-09 NOTE — Telephone Encounter (Signed)
   [facility] [address] [place], [place]  [address] Phone:  [phone]   Fax:  [phone]      Garrett [name] [birth year] [id number]  09/09/2024   Dear Dr. Jamee Shanks Chase:  We have scheduled the above named patient for an EGD and colonoscopy procedure. Our records show that he is on anticoagulation therapy.  Please advise as to whether the patient may come off their therapy of Xarelto  2 days prior to their procedure which is scheduled for 11/15/24.  Please route your response to Jola Concha, RMA or fax response to [phone].  Sincerely,    [facility]

## 2024-09-09 NOTE — Patient Instructions (Signed)
 SURGICAL WAITING ROOM VISITATION  Patients having surgery or a procedure may have no more than 2 support people in the waiting area - these visitors may rotate.    Children under the age of 43 must have an adult with them who is not the patient.  Visitors with respiratory illnesses are discouraged from visiting and should remain at home.  If the patient needs to stay at the hospital during part of their recovery, the visitor guidelines for inpatient rooms apply. Pre-op nurse will coordinate an appropriate time for 1 support person to accompany patient in pre-op.  This support person may not rotate.    Please refer to the [facility] website for the visitor guidelines for Inpatients (after your surgery is over and you are in a regular room).       Your procedure is scheduled on: 09-14-24   Report to [facility] Main Entrance    Report to admitting at    0845  AM   Call this number if you have problems the morning of surgery [phone]   Do not eat food or drink liquids :After Midnight.              If you have questions, please contact your surgeon's office.   FOLLOW ANY ADDITIONAL PRE OP INSTRUCTIONS YOU RECEIVED FROM YOUR SURGEON'S OFFICE!!!     Oral Hygiene is also important to reduce your risk of infection.                                    Remember - BRUSH YOUR TEETH THE MORNING OF SURGERY WITH YOUR REGULAR TOOTHPASTE  DENTURES WILL BE REMOVED PRIOR TO SURGERY PLEASE DO NOT APPLY Poly grip OR ADHESIVES!!!   Do NOT smoke after Midnight   Stop all vitamins and herbal supplements 7 days before surgery.   Take these medicines the morning of surgery with A SIP OF WATER : Topiramate , omeprazole , inhaler and bring rescue inhaler with you, metoprolol , memantine (namenda ), Flonase , fenofibrate , famotidine , atorvastatin , allopurinol   DO NOT TAKE ANY ORAL DIABETIC MEDICATIONS DAY OF YOUR SURGERY  Ozempic  hold for 7 days prior to day of surgery  Bring CPAP mask and  tubing day of surgery.                              You may not have any metal on your body including hair pins, jewelry, and body piercing             Do not wear  lotions, powders, perfumes/cologne, or deodorant   .               Men may shave face and neck.   Do not bring valuables to the hospital. [facility] IS NOT             RESPONSIBLE   FOR VALUABLES.   Contacts, glasses, dentures or bridgework may not be worn into surgery.   Bring small overnight bag day of surgery.   DO NOT BRING YOUR HOME MEDICATIONS TO THE HOSPITAL. PHARMACY WILL DISPENSE MEDICATIONS LISTED ON YOUR MEDICATION LIST TO YOU DURING YOUR ADMISSION IN THE HOSPITAL!    Patients discharged on the day of surgery will not be allowed to drive home.  Someone NEEDS to stay with you for the first 24 hours after anesthesia.   Special Instructions: Bring a copy of your healthcare  power of attorney and living will documents the day of surgery if you haven't scanned them before.              Please read over the following fact sheets you were given: IF YOU HAVE QUESTIONS ABOUT YOUR PRE-OP INSTRUCTIONS PLEASE CALL [phone].   If you test positive for Covid or have been in contact with anyone that has tested positive in the last 10 days please notify you surgeon.    [facility] - Preparing for Surgery Before surgery, you can play an important role.  Because skin is not sterile, your skin needs to be as free of germs as possible.  You can reduce the number of germs on your skin by washing with CHG (chlorahexidine gluconate) soap before surgery.  CHG is an antiseptic cleaner which kills germs and bonds with the skin to continue killing germs even after washing. Please DO NOT use if you have an allergy to CHG or antibacterial soaps.  If your skin becomes reddened/irritated stop using the CHG and inform your nurse when you arrive at Short Stay. Do not shave (including legs and underarms) for at least 48 hours prior to the first  CHG shower.  You may shave your face/neck. Please follow these instructions carefully:  1.  Shower with CHG Soap the night before surgery and the  morning of Surgery.  2.  If you choose to wash your hair, wash your hair first as usual with your  normal  shampoo.  3.  After you shampoo, rinse your hair and body thoroughly to remove the  shampoo.                            4.  Use CHG as you would any other liquid soap.  You can apply chg directly  to the skin and wash                       Gently with a scrungie or clean washcloth.  5.  Apply the CHG Soap to your body ONLY FROM THE NECK DOWN.   Do not use on face/ open                           Wound or open sores. Avoid contact with eyes, ears mouth and genitals (private parts).                       Wash face,  Genitals (private parts) with your normal soap.             6.  Wash thoroughly, paying special attention to the area where your surgery  will be performed.  7.  Thoroughly rinse your body with warm water  from the neck down.  8.  DO NOT shower/wash with your normal soap after using and rinsing off  the CHG Soap.                9.  Pat yourself dry with a clean towel.            10.  Wear clean pajamas.            11.  Place clean sheets on your bed the night of your first shower and do not  sleep with pets. Day of Surgery : Do not apply any lotions/deodorants the morning of surgery.  Please wear clean clothes  to the hospital/surgery center.  FAILURE TO FOLLOW THESE INSTRUCTIONS MAY RESULT IN THE CANCELLATION OF YOUR SURGERY PATIENT SIGNATURE_________________________________  NURSE SIGNATURE__________________________________  ________________________________________________________________________

## 2024-09-09 NOTE — Patient Instructions (Addendum)
 Your provider has requested that you go to the basement level for lab work before leaving today. Press B on the elevator. The lab is located at the first door on the left as you exit the elevator.  Your provider has requested that you have an abdominal x ray before leaving today. Please go to the basement floor to our Radiology department for the test.  You have been scheduled for an endoscopy and colonoscopy. Please follow the written instructions given to you at your visit today.  If you use inhalers (even only as needed), please bring them with you on the day of your procedure.  DO NOT TAKE 7 DAYS PRIOR TO TEST- Trulicity (dulaglutide) Ozempic , Wegovy  (semaglutide ) Mounjaro (tirzepatide) Bydureon Bcise (exanatide extended release)  DO NOT TAKE 1 DAY PRIOR TO YOUR TEST Rybelsus  (semaglutide ) Adlyxin (lixisenatide) Victoza  (liraglutide ) Byetta (exanatide) ___________________________________________________________________________   Behavioral and Dietary Strategies for Management of Esophageal Dysmotility/dysphagia 1. Take reflux medications 30+ minutes before food in the morning.  2. Begin meals with warm beverage 3. Eat smaller more frequent meals 4. Eat slowly, taking small bites and sips 5. Alternate solids and liquids 6. Avoid foods/liquids that increase acid production 7. Sit upright during and for 30+ minutes after meals to facilitate esophageal clearing 8. Can try altoid melting in mouth before food.  Gastroparesis Gastroparesis is a condition in which food takes longer than normal to empty from the stomach.  This condition is also known as delayed gastric emptying. It is usually a long-term (chronic) condition.  What are the signs or symptoms? Symptoms of this condition include: Feeling full after eating very little or a loss of appetite. Nausea, vomiting, or heartburn. Bloating of your abdomen. Inconsistent blood sugar (glucose) levels on blood  tests. Unexplained weight loss. Acid from the stomach coming up into the esophagus (gastroesophageal reflux). Sudden tightening (spasm) of the stomach, which can be painful. Symptoms may come and go. Some people may not notice any symptoms.  What increases the risk? You are more likely to develop this condition if: You have certain disorders or diseases. These may include: An endocrine disorder. An eating disorder. Amyloidosis. Scleroderma. Parkinson's disease. Multiple sclerosis. Cancer or infection of the stomach or the vagus nerve. You have had surgery on your stomach or vagus nerve. You take certain medicines. You are male.  Things you can do: Please do small frequent meals like 4-6 meals a day.  Eat and drink liquids at separate times.  Avoid high fiber foods, cook your vegetables, avoid high fat food.  Suggest spreading protein throughout the day (greek yogurt, glucerna, soft meat, milk, eggs) Choose soft foods that you can mash with a fork When you are more symptomatic, change to pureed foods foods and liquids.  Consider reading Living well with Gastroparesis by [facility] Check out this link to a diet online https://my.GroupJournal.fr  Small intestinal bacterial overgrowth (SIBO) occurs when there is an abnormal increase in the overall bacterial population in the small intestine -- particularly types of bacteria not commonly found in that part of the digestive tract. Small intestinal bacterial overgrowth (SIBO) commonly results when a circumstance -- such as surgery or disease -- slows the passage of food and waste products in the digestive tract, creating a breeding ground for bacteria.  Signs and symptoms of SIBO often include: Loss of appetite Abdominal pain Nausea Bloating An uncomfortable feeling of fullness after eating Diarrhea or constipation, depending on the  type of gas produced  What foods trigger SIBO? While  foods aren't the original cause of SIBO, certain foods do encourage the overgrowth of the wrong bacteria in your small intestine. If you're feeding them their favorite foods, they're going to grow more, and that will trigger more of your SIBO symptoms. By the same token, you can help reduce the overgrowth by starving the problematic bacteria of their favorite foods. This strategy has led to a number of proposed SIBO eating plans. The plans vary, and so do individual results. But in general, they tend to recommend limiting carbohydrates.  These include: Sugars and sweeteners. Fruits and starchy vegetables. Dairy products. Grains.  There is a test for this we can do called a breath test, if you are positive we will treat you with an antibiotic to see if it helps.  Your symptoms are very suspicious for this condition, as discussed, we will start you on an antibiotic to see if this helps.    Thank you for trusting me with your gastrointestinal care!   Alan Coombs, PA-C   _______________________________________________________  If your blood pressure at your visit was 140/90 or greater, please contact your primary care physician to follow up on this.  _______________________________________________________  If you are age 43 or older, your body mass index should be between 23-30. Your Body mass index is 28.65 kg/m. If this is out of the aforementioned range listed, please consider follow up with your Primary Care Provider.  If you are age 60 or younger, your body mass index should be between 19-25. Your Body mass index is 28.65 kg/m. If this is out of the aformentioned range listed, please consider follow up with your Primary Care Provider.   ________________________________________________________  The [facility] providers would like to encourage you to use MYCHART to communicate with providers for non-urgent requests or questions.   Due to long hold times on the telephone, sending your provider a message by [facility] may be a faster and more efficient way to get a response.  Please allow 48 business hours for a response.  Please remember that this is for non-urgent requests.  _______________________________________________________  [facility] is using a team-based approach to care.  Your team is made up of your doctor and two to three APPS. Our APPS (Nurse Practitioners and Physician Assistants) work with your physician to ensure care continuity for you. They are fully qualified to address your health concerns and develop a treatment plan. They communicate directly with your gastroenterologist to care for you. Seeing the Advanced Practice Practitioners on your physician's team can help you by facilitating care more promptly, often allowing for earlier appointments, access to diagnostic testing, procedures, and other specialty referrals.
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
## 2024-09-10 ENCOUNTER — Other Ambulatory Visit: Payer: Self-pay

## 2024-09-10 ENCOUNTER — Ambulatory Visit: Payer: Self-pay | Admitting: Physician Assistant

## 2024-09-10 ENCOUNTER — Encounter (HOSPITAL_COMMUNITY)
Admission: RE | Admit: 2024-09-10 | Discharge: 2024-09-10 | Disposition: A | Discharge status: Home / Self Care | Source: Ambulatory Visit | Attending: Urology | Admitting: Urology

## 2024-09-10 ENCOUNTER — Encounter (HOSPITAL_COMMUNITY): Payer: Self-pay

## 2024-09-10 DIAGNOSIS — G43909 Migraine, unspecified, not intractable, without status migrainosus: Secondary | ICD-10-CM | POA: Diagnosis not present

## 2024-09-10 DIAGNOSIS — K219 Gastro-esophageal reflux disease without esophagitis: Secondary | ICD-10-CM | POA: Insufficient documentation

## 2024-09-10 DIAGNOSIS — D649 Anemia, unspecified: Secondary | ICD-10-CM | POA: Diagnosis not present

## 2024-09-10 DIAGNOSIS — Z87891 Personal history of nicotine dependence: Secondary | ICD-10-CM | POA: Insufficient documentation

## 2024-09-10 DIAGNOSIS — M1990 Unspecified osteoarthritis, unspecified site: Secondary | ICD-10-CM | POA: Insufficient documentation

## 2024-09-10 DIAGNOSIS — N5201 Erectile dysfunction due to arterial insufficiency: Secondary | ICD-10-CM | POA: Insufficient documentation

## 2024-09-10 DIAGNOSIS — G4733 Obstructive sleep apnea (adult) (pediatric): Secondary | ICD-10-CM | POA: Insufficient documentation

## 2024-09-10 DIAGNOSIS — Z01818 Encounter for other preprocedural examination: Secondary | ICD-10-CM | POA: Insufficient documentation

## 2024-09-10 DIAGNOSIS — I5032 Chronic diastolic (congestive) heart failure: Secondary | ICD-10-CM | POA: Insufficient documentation

## 2024-09-10 DIAGNOSIS — J45909 Unspecified asthma, uncomplicated: Secondary | ICD-10-CM | POA: Diagnosis not present

## 2024-09-10 DIAGNOSIS — E1142 Type 2 diabetes mellitus with diabetic polyneuropathy: Secondary | ICD-10-CM | POA: Insufficient documentation

## 2024-09-10 DIAGNOSIS — I2510 Atherosclerotic heart disease of native coronary artery without angina pectoris: Secondary | ICD-10-CM | POA: Diagnosis not present

## 2024-09-10 DIAGNOSIS — I110 Hypertensive heart disease with heart failure: Secondary | ICD-10-CM | POA: Insufficient documentation

## 2024-09-10 DIAGNOSIS — I771 Stricture of artery: Secondary | ICD-10-CM | POA: Insufficient documentation

## 2024-09-10 DIAGNOSIS — E1121 Type 2 diabetes mellitus with diabetic nephropathy: Secondary | ICD-10-CM

## 2024-09-10 HISTORY — DX: Bell's palsy: G51.0

## 2024-09-10 HISTORY — DX: Atherosclerotic heart disease of native coronary artery without angina pectoris: I25.10

## 2024-09-10 HISTORY — DX: Chronic kidney disease, unspecified: N18.9

## 2024-09-10 LAB — CBC
HCT: 38.6 % — ABNORMAL LOW (ref 39.0–52.0)
Hemoglobin: 11.7 g/dL — ABNORMAL LOW (ref 13.0–17.0)
MCH: 28.1 pg (ref 26.0–34.0)
MCHC: 30.3 g/dL (ref 30.0–36.0)
MCV: 92.6 fL (ref 80.0–100.0)
Platelets: 343 K/uL (ref 150–400)
RBC: 4.17 MIL/uL — ABNORMAL LOW (ref 4.22–5.81)
RDW: 16.2 % — ABNORMAL HIGH (ref 11.5–15.5)
WBC: 9.1 K/uL (ref 4.0–10.5)
nRBC: 0 % (ref 0.0–0.2)

## 2024-09-10 LAB — BASIC METABOLIC PANEL WITH GFR
Anion gap: 16 — ABNORMAL HIGH (ref 5–15)
BUN: 23 mg/dL (ref 8–23)
CO2: 23 mmol/L (ref 22–32)
Calcium: 10.2 mg/dL (ref 8.9–10.3)
Chloride: 102 mmol/L (ref 98–111)
Creatinine, Ser: 1.79 mg/dL — ABNORMAL HIGH (ref 0.61–1.24)
GFR, Estimated: 39 mL/min — ABNORMAL LOW (ref >60)
Glucose, Bld: 111 mg/dL — ABNORMAL HIGH (ref 70–99)
Potassium: 3.8 mmol/L (ref 3.5–5.1)
Sodium: 141 mmol/L (ref 135–145)

## 2024-09-10 LAB — HEMOGLOBIN A1C
Hgb A1c MFr Bld: 6.1 % — ABNORMAL HIGH (ref 4.8–5.6)
Mean Plasma Glucose: 128.37 mg/dL

## 2024-09-10 LAB — GLUCOSE, CAPILLARY: Glucose-Capillary: 107 mg/dL — ABNORMAL HIGH (ref 70–99)

## 2024-09-10 NOTE — Progress Notes (Addendum)
 PCP - Nigel Antonio Meth , MD  Garrett [name] 08-12-24 epic Cardiologist - Debora Lennart Domino, MD LOV 05-07-24 epic     PPM/ICD -  Device Orders -  Rep Notified -   Chest x-ray - CT chest 05-06-24 CE EKG - preop Stress Test - 05-20-24 CE ECHO -  Cardiac Cath -   Sleep Study - yes INSPIRE  Fasting Blood Sugar - 150-180 Checks Blood Sugar __1___ times a day  Blood Thinner Instructions:Xarelto  Last dose 09-10-24  Aspirin  Instructions:  OZEMPIC - last dose 09-05-24  ERAS Protcol - PRE-SURGERY n/a   COVID vaccine -  Activity-- Able to complete ADL's without CP or SOB   Anesthesia review: OSA ,INSPIRE ,Cochlear implant, CAD, DVT,HTN,   Patient denies shortness of breath, fever, cough and chest pain at PAT appointment   All instructions explained to the patient, with a verbal understanding of the material. Patient agrees to go over the instructions while at home for a better understanding. Patient also instructed to self quarantine after being tested for COVID-19. The opportunity to ask questions was provided.

## 2024-09-10 NOTE — Patient Instructions (Signed)
 SURGICAL WAITING ROOM VISITATION  Patients having surgery or a procedure may have no more than 2 support people in the waiting area - these visitors may rotate.    Children under the age of 50 must have an adult with them who is not the patient.  Visitors with respiratory illnesses are discouraged from visiting and should remain at home.  If the patient needs to stay at the hospital during part of their recovery, the visitor guidelines for inpatient rooms apply. Pre-op nurse will coordinate an appropriate time for 1 support person to accompany patient in pre-op.  This support person may not rotate.    Please refer to the [facility] website for the visitor guidelines for Inpatients (after your surgery is over and you are in a regular room).       Your procedure is scheduled on:    Report to [facility] Main Entrance    Report to admitting at AM   Call this number if you have problems the morning of surgery [phone]   Do not eat food :After Midnight.   After Midnight you may have the following liquids until ______ AM/ PM DAY OF SURGERY  Water  Non-Citrus Juices (without pulp, NO RED-Apple, White grape, White cranberry) Black Coffee (NO MILK/CREAM OR CREAMERS, sugar ok)  Clear Tea (NO MILK/CREAM OR CREAMERS, sugar ok) regular and decaf                             Plain Jell-O (NO RED)                                           Fruit ices (not with fruit pulp, NO RED)                                     Popsicles (NO RED)                                                               Sports drinks like Gatorade (NO RED)              Drink 2 Ensure/G2 drinks AT 10:00 PM the night before surgery.        The day of surgery:  Drink ONE (1) Pre-Surgery Clear Ensure or G2 at AM the morning of surgery. Drink in one sitting. Do not sip.  This drink was given to you during your hospital  pre-op appointment visit. Nothing else to drink after completing the  Pre-Surgery Clear  Ensure or G2.          If you have questions, please contact your surgeon's office.   FOLLOW BOWEL PREP AND ANY ADDITIONAL PRE OP INSTRUCTIONS YOU RECEIVED FROM YOUR SURGEON'S OFFICE!!!     Oral Hygiene is also important to reduce your risk of infection.                                    Remember - BRUSH YOUR TEETH THE MORNING  OF SURGERY WITH YOUR REGULAR TOOTHPASTE  DENTURES WILL BE REMOVED PRIOR TO SURGERY PLEASE DO NOT APPLY Poly grip OR ADHESIVES!!!   Do NOT smoke after Midnight   Stop all vitamins and herbal supplements 7 days before surgery.   Take these medicines the morning of surgery with A SIP OF WATER : Topiramate , omeprazole   DO NOT TAKE ANY ORAL DIABETIC MEDICATIONS DAY OF YOUR SURGERY  Bring CPAP mask and tubing day of surgery.                              You may not have any metal on your body including hair pins, jewelry, and body piercing             Do not wear  lotions, powders, perfumes/cologne, or deodorant               Men may shave face and neck.   Do not bring valuables to the hospital. [place] IS NOT             RESPONSIBLE   FOR VALUABLES.   Contacts, glasses, dentures or bridgework may not be worn into surgery.   Bring small overnight bag day of surgery.   DO NOT BRING YOUR HOME MEDICATIONS TO THE HOSPITAL. PHARMACY WILL DISPENSE MEDICATIONS LISTED ON YOUR MEDICATION LIST TO YOU DURING YOUR ADMISSION IN THE HOSPITAL!    Patients discharged on the day of surgery will not be allowed to drive home.  Someone NEEDS to stay with you for the first 24 hours after anesthesia.   Special Instructions: Bring a copy of your healthcare power of attorney and living will documents the day of surgery if you haven't scanned them before.              Please read over the following fact sheets you were given: IF YOU HAVE QUESTIONS ABOUT YOUR PRE-OP INSTRUCTIONS PLEASE CALL [phone].   If you received a COVID test during your pre-op visit  it is requested  that you wear a mask when out in public, stay away from anyone that may not be feeling well and notify your surgeon if you develop symptoms. If you test positive for Covid or have been in contact with anyone that has tested positive in the last 10 days please notify you surgeon.    [place] - Preparing for Surgery Before surgery, you can play an important role.  Because skin is not sterile, your skin needs to be as free of germs as possible.  You can reduce the number of germs on your skin by washing with CHG (chlorahexidine gluconate) soap before surgery.  CHG is an antiseptic cleaner which kills germs and bonds with the skin to continue killing germs even after washing. Please DO NOT use if you have an allergy to CHG or antibacterial soaps.  If your skin becomes reddened/irritated stop using the CHG and inform your nurse when you arrive at Short Stay. Do not shave (including legs and underarms) for at least 48 hours prior to the first CHG shower.  You may shave your face/neck. Please follow these instructions carefully:  1.  Shower with CHG Soap the night before surgery and the  morning of Surgery.  2.  If you choose to wash your hair, wash your hair first as usual with your  normal  shampoo.  3.  After you shampoo, rinse your hair and body thoroughly to remove the  shampoo.  4.  Use CHG as you would any other liquid soap.  You can apply chg directly  to the skin and wash                       Gently with a scrungie or clean washcloth.  5.  Apply the CHG Soap to your body ONLY FROM THE NECK DOWN.   Do not use on face/ open                           Wound or open sores. Avoid contact with eyes, ears mouth and genitals (private parts).                       Wash face,  Genitals (private parts) with your normal soap.             6.  Wash thoroughly, paying special attention to the area where your surgery  will be performed.  7.  Thoroughly rinse your body with warm water   from the neck down.  8.  DO NOT shower/wash with your normal soap after using and rinsing off  the CHG Soap.                9.  Pat yourself dry with a clean towel.            10.  Wear clean pajamas.            11.  Place clean sheets on your bed the night of your first shower and do not  sleep with pets. Day of Surgery : Do not apply any lotions/deodorants the morning of surgery.  Please wear clean clothes to the hospital/surgery center.  FAILURE TO FOLLOW THESE INSTRUCTIONS MAY RESULT IN THE CANCELLATION OF YOUR SURGERY PATIENT SIGNATURE_________________________________  NURSE SIGNATURE__________________________________  ________________________________________________________________________

## 2024-09-13 ENCOUNTER — Encounter (HOSPITAL_COMMUNITY): Payer: Self-pay
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
## 2024-09-13 NOTE — Anesthesia Preprocedure Evaluation (Signed)
 Anesthesia Evaluation  Patient identified by MRN, date of birth, ID band Patient awake    Reviewed: Allergy & Precautions, NPO status , Patient's Chart, lab work & pertinent test results, reviewed documented beta blocker date and time   Airway Mallampati: I  TM Distance: >3 FB Neck ROM: Full    Dental  (+) Edentulous Upper, Edentulous Lower, Upper Dentures, Dental Advisory Given   Pulmonary asthma , sleep apnea , former smoker, PE   Pulmonary exam normal breath sounds clear to auscultation       Cardiovascular hypertension, Pt. on home beta blockers and Pt. on medications + CAD and +CHF  Normal cardiovascular exam Rhythm:Regular Rate:Normal  TTE 2022 1. Left ventricular ejection fraction, by estimation, is 60 to 65%. The  left ventricle has normal function. The left ventricle has no regional  wall motion abnormalities. Left ventricular diastolic parameters were  normal.   2. Right ventricular systolic function is normal. The right ventricular  size is normal.   3. The mitral valve is normal in structure. No evidence of mitral valve  regurgitation. No evidence of mitral stenosis.   4. The aortic valve is tricuspid. Aortic valve regurgitation is trivial.  Mild aortic valve sclerosis is present, with no evidence of aortic valve  stenosis.   5. The inferior vena cava is normal in size with greater than 50%  respiratory variability, suggesting right atrial pressure of 3 mmHg.   Stress test 05/20/24 ([facility] - scanned in media on 9/22) 1.  Normal SPECT perfusion imaging 2.  Pre and post myocardial perfusion scans are within normal limits 3.  No significant ischemia detected 4.  No evidence of infarct 5.  Negative for detection of ischemia or scar 6.  The calculated ejection fraction is 68%      Neuro/Psych  Headaches PSYCHIATRIC DISORDERS     Dementia    GI/Hepatic Neg liver ROS, hiatal hernia,GERD  ,,  Endo/Other  diabetes,  Type 2    Renal/GU Renal InsufficiencyRenal disease  negative genitourinary   Musculoskeletal  (+) Arthritis ,    Abdominal   Peds  Hematology  (+) Blood dyscrasia (xarelto )   Anesthesia Other Findings 73 yo male with PMH of former smoking, nonobstructive CAD (by cath in 2022), HFpEF, hx of recurrent PE (on Xarelto ), OSA (s/p Inspire in 03/2024), asthma, migraines, GERD, hiatal hernia s/p repair (2022), DM (A1c 6.1), anemia, arthritis, s/p lumbar fusion L2-L4 (2015, 2018), s/p ACDF (C5-C6)  Reproductive/Obstetrics                              Anesthesia Physical Anesthesia Plan  ASA: 3  Anesthesia Plan: General   Post-op Pain Management: Tylenol  PO (pre-op)*   Induction: Intravenous  PONV Risk Score and Plan: 2 and Ondansetron , Dexamethasone  and Treatment may vary due to age or medical condition  Airway Management Planned: LMA  Additional Equipment:   Intra-op Plan:   Post-operative Plan: Extubation in OR  Informed Consent: I have reviewed the patients History and Physical, chart, labs and discussed the procedure including the risks, benefits and alternatives for the proposed anesthesia with the patient or authorized representative who has indicated his/her understanding and acceptance.     Dental advisory given  Plan Discussed with: CRNA  Anesthesia Plan Comments: (See PAT note from 9/19 )         Anesthesia Quick Evaluation

## 2024-09-14 ENCOUNTER — Encounter (HOSPITAL_COMMUNITY)
Admission: RE | Disposition: A | Discharge status: Home / Self Care | Payer: Self-pay | Source: Home / Self Care | Attending: Urology

## 2024-09-14 ENCOUNTER — Other Ambulatory Visit: Payer: Self-pay

## 2024-09-14 ENCOUNTER — Ambulatory Visit (HOSPITAL_COMMUNITY)
Admission: RE | Admit: 2024-09-14 | Discharge: 2024-09-14 | Disposition: A | Discharge status: Home / Self Care | Attending: Urology | Admitting: Urology

## 2024-09-14 ENCOUNTER — Telehealth: Payer: Self-pay | Admitting: Physician Assistant

## 2024-09-14 ENCOUNTER — Ambulatory Visit (HOSPITAL_BASED_OUTPATIENT_CLINIC_OR_DEPARTMENT_OTHER): Payer: Self-pay | Admitting: Certified Registered"

## 2024-09-14 ENCOUNTER — Encounter (HOSPITAL_COMMUNITY): Payer: Self-pay | Admitting: Urology

## 2024-09-14 ENCOUNTER — Ambulatory Visit (HOSPITAL_COMMUNITY): Payer: Self-pay | Admitting: Physician Assistant

## 2024-09-14 DIAGNOSIS — E1122 Type 2 diabetes mellitus with diabetic chronic kidney disease: Secondary | ICD-10-CM | POA: Insufficient documentation

## 2024-09-14 DIAGNOSIS — I5032 Chronic diastolic (congestive) heart failure: Secondary | ICD-10-CM | POA: Diagnosis not present

## 2024-09-14 DIAGNOSIS — I2510 Atherosclerotic heart disease of native coronary artery without angina pectoris: Secondary | ICD-10-CM | POA: Diagnosis not present

## 2024-09-14 DIAGNOSIS — Z87891 Personal history of nicotine dependence: Secondary | ICD-10-CM | POA: Diagnosis not present

## 2024-09-14 DIAGNOSIS — K219 Gastro-esophageal reflux disease without esophagitis: Secondary | ICD-10-CM | POA: Diagnosis not present

## 2024-09-14 DIAGNOSIS — I509 Heart failure, unspecified: Secondary | ICD-10-CM | POA: Diagnosis not present

## 2024-09-14 DIAGNOSIS — I130 Hypertensive heart and chronic kidney disease with heart failure and stage 1 through stage 4 chronic kidney disease, or unspecified chronic kidney disease: Secondary | ICD-10-CM | POA: Insufficient documentation

## 2024-09-14 DIAGNOSIS — N529 Male erectile dysfunction, unspecified: Secondary | ICD-10-CM | POA: Insufficient documentation

## 2024-09-14 DIAGNOSIS — G4733 Obstructive sleep apnea (adult) (pediatric): Secondary | ICD-10-CM | POA: Insufficient documentation

## 2024-09-14 DIAGNOSIS — N5201 Erectile dysfunction due to arterial insufficiency: Secondary | ICD-10-CM | POA: Diagnosis present

## 2024-09-14 DIAGNOSIS — N189 Chronic kidney disease, unspecified: Secondary | ICD-10-CM | POA: Diagnosis not present

## 2024-09-14 DIAGNOSIS — J45909 Unspecified asthma, uncomplicated: Secondary | ICD-10-CM | POA: Insufficient documentation

## 2024-09-14 DIAGNOSIS — E1121 Type 2 diabetes mellitus with diabetic nephropathy: Secondary | ICD-10-CM

## 2024-09-14 DIAGNOSIS — Z86711 Personal history of pulmonary embolism: Secondary | ICD-10-CM | POA: Diagnosis not present

## 2024-09-14 DIAGNOSIS — Z85828 Personal history of other malignant neoplasm of skin: Secondary | ICD-10-CM | POA: Diagnosis not present

## 2024-09-14 HISTORY — PX: PENILE PROSTHESIS IMPLANT: SHX240

## 2024-09-14 LAB — GLUCOSE, CAPILLARY
Glucose-Capillary: 134 mg/dL — ABNORMAL HIGH (ref 70–99)
Glucose-Capillary: 108 mg/dL — ABNORMAL HIGH (ref 70–99)

## 2024-09-14 SURGERY — INSERTION, PENILE PROSTHESIS
Anesthesia: General

## 2024-09-14 MED ORDER — CHLORHEXIDINE GLUCONATE 0.12 % MT SOLN
15.0000 mL | Freq: Once | OROMUCOSAL | Status: AC
  Administered 2024-09-14: 15 mL via OROMUCOSAL

## 2024-09-14 MED ORDER — PROPOFOL 10 MG/ML IV BOLUS
INTRAVENOUS | Status: DC | PRN
Start: 2024-09-14 — End: 2024-09-14
  Administered 2024-09-14: 100 mg via INTRAVENOUS

## 2024-09-14 MED ORDER — ONDANSETRON HCL 4 MG/2ML IJ SOLN
INTRAMUSCULAR | Status: AC
  Filled 2024-09-14: qty 2

## 2024-09-14 MED ORDER — MUPIROCIN 2 % EX OINT
1.0000 | TOPICAL_OINTMENT | Freq: Once | CUTANEOUS | Status: AC
  Administered 2024-09-14: 1 via NASAL
  Filled 2024-09-14: qty 22

## 2024-09-14 MED ORDER — OXYCODONE HCL 5 MG PO TABS
ORAL_TABLET | ORAL | Status: AC
  Filled 2024-09-14: qty 1

## 2024-09-14 MED ORDER — LIDOCAINE HCL 1 % IJ SOLN
INTRAMUSCULAR | Status: DC | PRN
  Administered 2024-09-14: 30 mL

## 2024-09-14 MED ORDER — FLUCONAZOLE IN SODIUM CHLORIDE 200-0.9 MG/100ML-% IV SOLN
200.0000 mg | INTRAVENOUS | Status: DC
  Administered 2024-09-14: 200 mg via INTRAVENOUS
  Filled 2024-09-14: qty 100

## 2024-09-14 MED ORDER — CHLORHEXIDINE GLUCONATE 4 % EX SOLN: Freq: Once | CUTANEOUS | Status: DC

## 2024-09-14 MED ORDER — LACTATED RINGERS IV SOLN: INTRAVENOUS | Status: DC

## 2024-09-14 MED ORDER — PHENYLEPHRINE 80 MCG/ML (10ML) SYRINGE FOR IV PUSH (FOR BLOOD PRESSURE SUPPORT)
PREFILLED_SYRINGE | INTRAVENOUS | Status: AC
  Filled 2024-09-14: qty 10

## 2024-09-14 MED ORDER — PROPOFOL 10 MG/ML IV BOLUS
INTRAVENOUS | Status: AC
  Filled 2024-09-14: qty 20

## 2024-09-14 MED ORDER — ACETAMINOPHEN 500 MG PO TABS
1000.0000 mg | ORAL_TABLET | Freq: Once | ORAL | Status: AC
  Administered 2024-09-14: 1000 mg via ORAL
  Filled 2024-09-14: qty 2

## 2024-09-14 MED ORDER — FENTANYL CITRATE PF 50 MCG/ML IJ SOSY
25.0000 ug | PREFILLED_SYRINGE | INTRAMUSCULAR | Status: DC | PRN
  Administered 2024-09-14: 50 ug via INTRAVENOUS

## 2024-09-14 MED ORDER — CELECOXIB 200 MG PO CAPS: 200.0000 mg | ORAL_CAPSULE | Freq: Two times a day (BID) | ORAL | 1 refills | Status: AC

## 2024-09-14 MED ORDER — PHENYLEPHRINE HCL-NACL 20-0.9 MG/250ML-% IV SOLN
INTRAVENOUS | Status: DC | PRN
  Administered 2024-09-14: 40 ug/min via INTRAVENOUS

## 2024-09-14 MED ORDER — FENTANYL CITRATE (PF) 100 MCG/2ML IJ SOLN
INTRAMUSCULAR | Status: AC
  Filled 2024-09-14: qty 2

## 2024-09-14 MED ORDER — FENTANYL CITRATE (PF) 100 MCG/2ML IJ SOLN
INTRAMUSCULAR | Status: DC | PRN
  Administered 2024-09-14 (×2): 25 ug via INTRAVENOUS
  Administered 2024-09-14: 50 ug via INTRAVENOUS

## 2024-09-14 MED ORDER — OXYCODONE HCL 5 MG/5ML PO SOLN: 5.0000 mg | Freq: Once | ORAL | Status: AC | PRN

## 2024-09-14 MED ORDER — AMISULPRIDE (ANTIEMETIC) 5 MG/2ML IV SOLN: 10.0000 mg | Freq: Once | INTRAVENOUS | Status: DC | PRN

## 2024-09-14 MED ORDER — LIDOCAINE HCL (CARDIAC) PF 100 MG/5ML IV SOSY
PREFILLED_SYRINGE | INTRAVENOUS | Status: DC | PRN
  Administered 2024-09-14: 40 mg via INTRAVENOUS

## 2024-09-14 MED ORDER — LIDOCAINE HCL (PF) 1 % IJ SOLN
INTRAMUSCULAR | Status: AC
  Filled 2024-09-14: qty 30

## 2024-09-14 MED ORDER — INSULIN ASPART 100 UNIT/ML IJ SOLN: 0.0000 [IU] | INTRAMUSCULAR | Status: DC | PRN

## 2024-09-14 MED ORDER — ONDANSETRON HCL 4 MG/2ML IJ SOLN
INTRAMUSCULAR | Status: DC | PRN
  Administered 2024-09-14: 4 mg via INTRAVENOUS

## 2024-09-14 MED ORDER — STERILE WATER FOR IRRIGATION IR SOLN
Status: DC | PRN
  Administered 2024-09-14: 500 mL

## 2024-09-14 MED ORDER — PHENYLEPHRINE 80 MCG/ML (10ML) SYRINGE FOR IV PUSH (FOR BLOOD PRESSURE SUPPORT)
PREFILLED_SYRINGE | INTRAVENOUS | Status: DC | PRN
  Administered 2024-09-14 (×2): 160 ug via INTRAVENOUS

## 2024-09-14 MED ORDER — LIDOCAINE HCL (PF) 2 % IJ SOLN
INTRAMUSCULAR | Status: AC
  Filled 2024-09-14: qty 5

## 2024-09-14 MED ORDER — IRRISEPT - 450ML BOTTLE WITH 0.05% CHG IN STERILE WATER, USP 99.95% OPTIME
TOPICAL | Status: DC | PRN
  Administered 2024-09-14: 900 mL via TOPICAL

## 2024-09-14 MED ORDER — BUPIVACAINE HCL 0.5 % IJ SOLN
INTRAMUSCULAR | Status: DC | PRN
  Administered 2024-09-14: 30 mL

## 2024-09-14 MED ORDER — BUPIVACAINE HCL (PF) 0.5 % IJ SOLN
INTRAMUSCULAR | Status: AC
  Filled 2024-09-14: qty 30

## 2024-09-14 MED ORDER — DEXAMETHASONE SODIUM PHOSPHATE 10 MG/ML IJ SOLN
INTRAMUSCULAR | Status: DC | PRN
  Administered 2024-09-14: 4 mg via INTRAVENOUS

## 2024-09-14 MED ORDER — ORAL CARE MOUTH RINSE: 15.0000 mL | Freq: Once | OROMUCOSAL | Status: AC

## 2024-09-14 MED ORDER — OXYCODONE HCL 5 MG PO TABS: 5.0000 mg | ORAL_TABLET | Freq: Four times a day (QID) | ORAL | 0 refills | Status: DC | PRN

## 2024-09-14 MED ORDER — OXYCODONE HCL 5 MG PO TABS
5.0000 mg | ORAL_TABLET | Freq: Once | ORAL | Status: AC | PRN
  Administered 2024-09-14: 5 mg via ORAL

## 2024-09-14 MED ORDER — SODIUM CHLORIDE 0.9 % IR SOLN
Status: DC | PRN
  Administered 2024-09-14: 1000 mL

## 2024-09-14 MED ORDER — ACETAMINOPHEN 500 MG PO TABS: 1000.0000 mg | ORAL_TABLET | Freq: Four times a day (QID) | ORAL | 0 refills | Status: DC

## 2024-09-14 MED ORDER — DEXTROSE 5 % IV SOLN
5.0000 mg/kg | INTRAVENOUS | Status: AC
  Administered 2024-09-14: 390 mg via INTRAVENOUS
  Filled 2024-09-14: qty 9.75

## 2024-09-14 MED ORDER — DEXAMETHASONE SODIUM PHOSPHATE 10 MG/ML IJ SOLN
INTRAMUSCULAR | Status: AC
  Filled 2024-09-14: qty 1

## 2024-09-14 MED ORDER — FENTANYL CITRATE PF 50 MCG/ML IJ SOSY
PREFILLED_SYRINGE | INTRAMUSCULAR | Status: AC
  Filled 2024-09-14: qty 1

## 2024-09-14 MED ORDER — VANCOMYCIN HCL 1000 MG IV SOLR
INTRAVENOUS | Status: AC
  Filled 2024-09-14: qty 20

## 2024-09-14 MED ORDER — VANCOMYCIN HCL 1500 MG/300ML IV SOLN
1500.0000 mg | INTRAVENOUS | Status: AC
  Administered 2024-09-14: 1500 mg via INTRAVENOUS
  Filled 2024-09-14: qty 300

## 2024-09-14 SURGICAL SUPPLY — 48 items
BAG URINE DRAIN 2000ML AR STRL (UROLOGICAL SUPPLIES) IMPLANT
BLADE SURG 15 STRL LF DISP TIS (BLADE) ×1 IMPLANT
BNDG GAUZE DERMACEA FLUFF 4 (GAUZE/BANDAGES/DRESSINGS) ×1 IMPLANT
BRIEF MESH DISP LRG (UNDERPADS AND DIAPERS) ×1 IMPLANT
CATH COUDE 5CC RIBBED (CATHETERS) ×1 IMPLANT
CHLORAPREP W/TINT 26 (MISCELLANEOUS) ×2 IMPLANT
COVER MAYO STAND STRL (DRAPES) ×1 IMPLANT
COVER SURGICAL LIGHT HANDLE (MISCELLANEOUS) ×1 IMPLANT
DERMABOND ADVANCED .7 DNX12 (GAUZE/BANDAGES/DRESSINGS) ×1 IMPLANT
DRAIN CHANNEL 10F 3/8 F FF (DRAIN) ×1 IMPLANT
DRAPE INCISE IOBAN 66X45 STRL (DRAPES) ×1 IMPLANT
DRAPE LAPAROTOMY T 98X78 PEDS (DRAPES) ×1 IMPLANT
DRSG TEGADERM 4X4.75 (GAUZE/BANDAGES/DRESSINGS) ×1 IMPLANT
ELECT REM PT RETURN 15FT ADLT (MISCELLANEOUS) ×1 IMPLANT
EVACUATOR SILICONE 100CC (DRAIN) ×1 IMPLANT
GLOVE BIO SURGEON STRL SZ7 (GLOVE) ×1 IMPLANT
GLOVE BIOGEL PI IND STRL 7.0 (GLOVE) ×1 IMPLANT
GOWN STRL REUS W/ TWL XL LVL3 (GOWN DISPOSABLE) ×1 IMPLANT
HOLDER FOLEY CATH W/STRAP (MISCELLANEOUS) IMPLANT
IMPL RTE SNAPCONE CX 1.0 (Urological Implant) IMPLANT
IMPL RTE STACKING CX LGX1.5 (Urological Implant) IMPLANT
KIT ACCESSORY AMS 700 PUMP (Urological Implant) IMPLANT
KIT BASIN OR (CUSTOM PROCEDURE TRAY) ×1 IMPLANT
KIT TURNOVER KIT A (KITS) ×1 IMPLANT
LAVAGE JET IRRISEPT WOUND (IRRIGATION / IRRIGATOR) IMPLANT
NDL HYPO 22X1.5 SAFETY MO (MISCELLANEOUS) ×1 IMPLANT
NEEDLE HYPO 22X1.5 SAFETY MO (MISCELLANEOUS) ×1 IMPLANT
NS IRRIG 1000ML POUR BTL (IV SOLUTION) ×1 IMPLANT
PACK GENERAL/GYN (CUSTOM PROCEDURE TRAY) ×1 IMPLANT
PLUG CATH AND CAP STRL 200 (CATHETERS) ×1 IMPLANT
PUMP PRECONNECT MS 18 LGX (Miscellaneous) IMPLANT
RESERVOIR FLAT IZ 100ML (Miscellaneous) IMPLANT
RETRACTOR DEEP SCROTAL PENILE (MISCELLANEOUS) IMPLANT
RETRACTOR WILSON SYSTEM (INSTRUMENTS) IMPLANT
SET COLLECT BLD 21X.75 12 PB G (NEEDLE) ×1 IMPLANT
SOLUTION PRONTOSAN WOUND 350ML (IRRIGATION / IRRIGATOR) IMPLANT
SURGILUBE 2OZ TUBE FLIPTOP (MISCELLANEOUS) IMPLANT
SUT ETHILON 3 0 PS 1 (SUTURE) ×1 IMPLANT
SUT MNCRL AB 4-0 PS2 18 (SUTURE) ×1 IMPLANT
SUT VIC AB 2-0 UR6 27 (SUTURE) ×4 IMPLANT
SUT VIC AB 3-0 SH 27X BRD (SUTURE) ×2 IMPLANT
SYR 10ML LL (SYRINGE) ×2 IMPLANT
SYR 20ML LL LF (SYRINGE) ×1 IMPLANT
SYR 50ML LL SCALE MARK (SYRINGE) ×3 IMPLANT
SYR CONTROL 10ML LL (SYRINGE) ×1 IMPLANT
TOWEL GREEN STERILE FF (TOWEL DISPOSABLE) ×1 IMPLANT
TOWEL OR 17X26 10 PK STRL BLUE (TOWEL DISPOSABLE) ×1 IMPLANT
WATER STERILE IRR 500ML POUR (IV SOLUTION) ×1 IMPLANT
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
## 2024-09-14 NOTE — Op Note (Signed)
 PATIENT:  Ozell BIRCH Mineo  PRE-OPERATIVE DIAGNOSIS:  Organic erectile dysfunction  POST-OPERATIVE DIAGNOSIS:  Same  PROCEDURE:   3 piece inflatable penile prosthesis (BS/AMS) Injection of pharmacoagent into penis  SURGEON:  Herlene Foot MD  ASST: Lyle Civil, MD  INDICATION: He has had long-standing organic erectile dysfunction and refractory to other modes of treatment. He has elected to proceed with prosthesis implantation.  ANESTHESIA:  General  EBL:  50 cc  Device: 3 piece AMS LGX 700: 99 cc reservoir, 18 cm cylinders and 2.5 cm rear-tip extenders on right and left sides  LOCAL MEDICATIONS USED:   penile block done with 10 cc lidocaine /marcaine  mixture 10 cc injected into corpora directly via butterfly needle  SPECIMEN: None  Description of procedure: The patient was taken to the major operating room, placed on the table and administered general anesthesia in the supine position. His genitalia was then prepped with chlorhexidine  x 2. He was draped in the usual sterile fashion, and I used Ioban on the field. An official timeout was then performed.  A dorsal penile block was performed. A butterfly needle was then used to inject normal saline into the penis to give an artificial erection. There was no clinically significant curvature or deformity. I then injected 10 cc of lidocaine /marcaine  into the penis.   A 14 French coude catheter was then placed in the bladder and the bladder was drained and the catheter was plugged. A midline penoscrotal incision was then made and the dissection was carried down to the corpora and urethra. The lonestar retractor was positioned so as to have excellent exposure. 2-0 Vicryl sutures were then placed proximally in each corpus cavernosum to serve as stay sutures. An incision was then made in the corpus cavernosum first on the left-hand side with the bovie. Tamra were used to gently dilate the opening. I then dilated the corpus cavernosum with the a  12 Fr brooks dilator distally and proximally. Field goal post tests were performed and there was no evidence of perforations or crossover. I then irrigated the corpus cavernosum with antibiotic solution and measured the distance proximally and distally from the stay suture and was found to be 9 and 11 cm, respectively.I then turned my attention to the contralateral corpus cavernosum and placed my stay sutures, made my corporotomy and dilated the corpus cavernosum in an identical fashion. This was measured and also was found to be 9.5 cm proximally and 11 distally. It was irrigated with anastomotic solution as was the scrotum. I then chose an 18 cm cylinder set with 2.5 cm rear-tip extenders and these were prepped while I prepared the site for reservoir placement.  I then digitally probed into the Left external inguinal ring. My finger was used to poke through the posterior wall of the ring. I used my finger to ensure I was in the appropriate space, and to clear room for the reservoir. I irrigated the space with anastomotic solution and then placed the reservoir in this location. I then filled the reservoir with 99 cc of sterile saline, and checked to confirm proper position. There was minimal backpressure with the reservoir max-filled.  Attention was redirected to the corporotomies where the cylinders were then placed by first fixing the suture to the distal aspect of the right cylinder to a straight needle. This was then loaded on the [facility] inserter and passed through the corporotomy and distally. I then advanced the straight needle with the Furlow inserter out through the glans and this was grasped  with a hemostat and pulled through the glans and the suture was secured with a hemostat. I then performed an identical maneuver on the contralateral side. After this was performed I irrigated both corpus cavernosum; there was no evidence of urethral perforation. I inserted the distal portion of the cylinder through  the corporotomies and pulled this to the end of the corpora with the suture. The proximal aspect with the rear-tip extender was then passed through the corporotomy and into the seated position on each side. I then connected reservoir tubing to a syringe filled with sterile saline and inflated the device. I noted a good straight erection with both cylinders equidistant under the glans and no buckling of the cylinders. I therefore deflated the device and closed the corporotomies with used my previously placed stay sutures.   I then grasped the scrotal skin in the midline with a babcock, and used a hemostat to dissect down to the dependent-most portion of the scrotum. The nasal speculum was inserted into this space, and facilitated placement of the pump. The cylinder was then connected to the pump after excising the excess tubing with appropriate shodded hemostats in place and then I used the supplied connectors to make the connection. I then again cycled the device with the pump and it cycled properly. I deflated the device and pumped it up about three quarters of the way to aid with hemostasis. I irrigated the wound one last time with antibiotic irrigation and then closed the deep scrotal tissue over the tubing and pump with running 3-0 monocryl suture. I placed a 10 Fr blake drain over the corporotomies. A second layer was then closed over this first layer with running 3- 0 monocryl, and running skin suture w/ 4-0 monocryl performed. Incision dressed with dermabond.  A mummy wrap was applied. The catheter was removed, and drain connected to suction bulb and the patient was awakened and taken recovery room in stable and satisfactory condition. He tolerated the procedure well and there were no intraoperative complications. Needle sponge and instrument counts were correct at the end of the operation.

## 2024-09-14 NOTE — Anesthesia Procedure Notes (Signed)
 Procedure Name: LMA Insertion Date/Time: 09/14/2024 11:05 AM  Performed by: Metta Andrea NOVAK, CRNAPre-anesthesia Checklist: Patient identified, Emergency Drugs available, Suction available, Patient being monitored and Timeout performed Patient Re-evaluated:Patient Re-evaluated prior to induction Oxygen  Delivery Method: Circle system utilized Preoxygenation: Pre-oxygenation with 100% oxygen  Induction Type: IV induction Ventilation: Mask ventilation without difficulty LMA: LMA inserted LMA Size: 4.0 Number of attempts: 1 Tube secured with: Tape Dental Injury: Teeth and Oropharynx as per pre-operative assessment

## 2024-09-14 NOTE — Discharge Instructions (Signed)
 Penile prosthesis postoperative instructions  Wound:  In most cases your incision will have absorbable sutures that will dissolve within the first 10-20 days. Some will fall out even earlier. Expect some redness as the sutures dissolved but this should occur only around the sutures. If there is generalized redness, especially with increasing pain or swelling, let us know. The scrotum and penis will very likely get "black and blue" as the blood in the tissues spread. Sometimes the whole scrotum will turn colors. The black and blue is followed by a yellow and brown color. In time, all the discoloration will go away. In some cases some firm swelling in the area of the testicle and pump may persist for up to 4-6 weeks after the surgery and is considered normal in most cases.  Drain:   You may be discharged home with a drain in place. If so, you will be taught how to empty it and should keep track of the output. Additionally, you should call the office to arrange for an appointment to have it removed after a few days.   Diet:  You may return to your normal diet within 24 hours following your surgery. You may note some mild nausea and possibly vomiting the first 6-8 hours following surgery. This is usually due to the side effects of anesthesia, and will disappear quite soon. I would suggest clear liquids and a very light meal the first evening following your surgery.  Activity:  Your physical activity should be restricted the first 48 hours. During that time you should remain relatively inactive, moving about only when necessary. During the first 3 weeks following surgery you should avoid lifting any heavy objects (anything greater than 15 pounds), and avoid strenuous exercise. If you work, ask [place] specifically about your restrictions, both for work and home. We will write a note to your employer if needed.  Avoid using your penis until your follow up visit with Dr Lafonda Mosses, which will typically be around  3-4 weeks following the surgery. Most people are able to start cycling their device after that appointment, and can have intercourse soon thereafter.   You should plan to wear a tight pair of jockey shorts or an athletic supporter for the first 4-5 days, even to sleep. This will keep the scrotum immobilized to some degree and keep the swelling down.The position of your penis will determine what is most comfortable but I strongly urge you to keep the penis in the "up" position (toward your head). You should continue to tuck "up" your penis when possible for the first 3 months following surgery.  Ice packs should be placed on and off over the scrotum for the first 48 hours. Frozen peas or corn in a ZipLock bag can be frozen, used and re-frozen. Fifteen minutes on and 15 minutes off is a reasonable schedule. The ice is a good pain reliever and keeps the swelling down.  Hygiene:  You may shower 48 hours after your surgery. Tub bathing should be restricted until the wound is completely healed, typically around 2-3 weeks.  Medication:  You will be sent home with some type of pain medication. In many cases you will be sent home with a strong anti-inflammatory medication (Celebrex, Meloxicam) and a narcotic pain pill (hydrocodone or oxycodone). You can also supplement these medications with tylenol (acetaminophen). If the pain medication you are sent home with does not control the pain, please notify the office Problems you should report to [place]:  Fever of 101.0 degrees  Fahrenheit or greater. Moderate or severe swelling under the skin incision or involving the scrotum. Drug reaction such as hives, a rash, nausea or vomiting.

## 2024-09-14 NOTE — Transfer of Care (Signed)
 Immediate Anesthesia Transfer of Care Note  Patient: Garrett [name]  Procedure(s) Performed: INSERTION, PENILE PROSTHESIS  Patient Location: PACU  Anesthesia Type:General  Level of Consciousness: drowsy and patient cooperative  Airway & Oxygen  Therapy: Patient Spontanous Breathing and Patient connected to face mask oxygen   Post-op Assessment: Report given to RN and Post -op Vital signs reviewed and stable  Post vital signs: Reviewed and stable  Last Vitals:  Vitals Value Taken Time  BP 124/75 09/14/24 12:48  Temp    Pulse 75 09/14/24 12:51  Resp 9 09/14/24 12:51  SpO2 100 % 09/14/24 12:51  Vitals shown include unfiled device data.  Last Pain:  Vitals:   09/14/24 0910  TempSrc: Oral         Complications: No notable events documented.

## 2024-09-15 ENCOUNTER — Encounter (HOSPITAL_COMMUNITY): Payer: Self-pay | Admitting: Urology

## 2024-09-15 NOTE — Anesthesia Postprocedure Evaluation (Signed)
 Anesthesia Post Note  Patient: Garrett [name]  Procedure(s) Performed: INSERTION, PENILE PROSTHESIS     Patient location during evaluation: PACU Anesthesia Type: General Level of consciousness: awake Pain management: pain level controlled Vital Signs Assessment: post-procedure vital signs reviewed and stable Respiratory status: spontaneous breathing, nonlabored ventilation and respiratory function stable Cardiovascular status: blood pressure returned to baseline and stable Postop Assessment: no apparent nausea or vomiting Anesthetic complications: no   No notable events documented.  Last Vitals:  Vitals:   09/14/24 1444 09/14/24 1704  BP: 109/70 103/72  Pulse: 77   Resp: 18   Temp: 36.5 C 36.5 C  SpO2: 98% 96%    Last Pain:  Vitals:   09/14/24 1704  TempSrc:   PainSc: 4                  Garrett [name]

## 2024-09-16 ENCOUNTER — Other Ambulatory Visit (HOSPITAL_COMMUNITY): Payer: Self-pay

## 2024-09-16 ENCOUNTER — Other Ambulatory Visit: Payer: Self-pay

## 2024-09-16 ENCOUNTER — Other Ambulatory Visit: Payer: Self-pay | Admitting: Family Medicine

## 2024-09-17 ENCOUNTER — Other Ambulatory Visit (HOSPITAL_COMMUNITY): Payer: Self-pay

## 2024-09-17 ENCOUNTER — Other Ambulatory Visit: Payer: Self-pay

## 2024-09-17 MED ORDER — OZEMPIC (0.25 OR 0.5 MG/DOSE) 2 MG/3ML ~~LOC~~ SOPN
0.5000 mg | PEN_INJECTOR | SUBCUTANEOUS | 1 refills | Status: DC
  Filled 2024-09-17: qty 9, 84d supply, fill #0
  Filled 2024-12-06: qty 9, 84d supply, fill #1

## 2024-09-24 ENCOUNTER — Other Ambulatory Visit: Payer: Self-pay | Admitting: Family Medicine

## 2024-09-24 ENCOUNTER — Other Ambulatory Visit (HOSPITAL_COMMUNITY): Payer: Self-pay

## 2024-09-24 ENCOUNTER — Other Ambulatory Visit: Payer: Self-pay

## 2024-09-24 ENCOUNTER — Encounter: Payer: Self-pay | Admitting: Family Medicine

## 2024-09-24 DIAGNOSIS — I10 Essential (primary) hypertension: Secondary | ICD-10-CM

## 2024-09-24 DIAGNOSIS — D500 Iron deficiency anemia secondary to blood loss (chronic): Secondary | ICD-10-CM

## 2024-09-24 DIAGNOSIS — I2699 Other pulmonary embolism without acute cor pulmonale: Secondary | ICD-10-CM

## 2024-09-24 MED ORDER — ALBUTEROL SULFATE HFA 108 (90 BASE) MCG/ACT IN AERS
1.0000 | INHALATION_SPRAY | Freq: Four times a day (QID) | RESPIRATORY_TRACT | 1 refills | Status: AC | PRN
  Filled 2024-09-24: qty 6.7, 50d supply, fill #0

## 2024-09-24 MED ORDER — FUROSEMIDE 20 MG PO TABS
20.0000 mg | ORAL_TABLET | Freq: Every day | ORAL | 1 refills | Status: AC
  Filled 2024-09-24: qty 90, 90d supply, fill #0
  Filled 2024-12-19: qty 90, 90d supply, fill #1

## 2024-09-24 MED ORDER — CELECOXIB 200 MG PO CAPS
200.0000 mg | ORAL_CAPSULE | Freq: Two times a day (BID) | ORAL | 1 refills | Status: AC
  Filled 2024-09-24: qty 180, 90d supply, fill #0
  Filled 2024-12-19: qty 180, 90d supply, fill #1

## 2024-09-24 MED ORDER — FENOFIBRATE 160 MG PO TABS
160.0000 mg | ORAL_TABLET | Freq: Every day | ORAL | 1 refills | Status: AC
  Filled 2024-09-24: qty 90, 90d supply, fill #0
  Filled 2024-12-19: qty 90, 90d supply, fill #1

## 2024-09-24 MED ORDER — DULERA 100-5 MCG/ACT IN AERO
1.0000 | INHALATION_SPRAY | Freq: Two times a day (BID) | RESPIRATORY_TRACT | 11 refills | Status: AC
  Filled 2024-09-24: qty 13, 30d supply, fill #0

## 2024-09-24 MED ORDER — ONETOUCH ULTRA TEST VI STRP
ORAL_STRIP | 12 refills | Status: DC
  Filled 2024-09-24: qty 300, 100d supply, fill #0

## 2024-09-25 ENCOUNTER — Other Ambulatory Visit (HOSPITAL_COMMUNITY): Payer: Self-pay

## 2024-09-27 ENCOUNTER — Other Ambulatory Visit: Payer: Self-pay

## 2024-09-30 ENCOUNTER — Ambulatory Visit: Admitting: Podiatry

## 2024-09-30 ENCOUNTER — Encounter: Payer: Self-pay | Admitting: Podiatry

## 2024-09-30 DIAGNOSIS — M79675 Pain in left toe(s): Secondary | ICD-10-CM

## 2024-09-30 DIAGNOSIS — B351 Tinea unguium: Secondary | ICD-10-CM | POA: Diagnosis not present

## 2024-09-30 DIAGNOSIS — M79674 Pain in right toe(s): Secondary | ICD-10-CM | POA: Diagnosis not present

## 2024-09-30 DIAGNOSIS — E1121 Type 2 diabetes mellitus with diabetic nephropathy: Secondary | ICD-10-CM | POA: Diagnosis not present

## 2024-09-30 NOTE — Progress Notes (Addendum)
 This patient returns to my office for at risk foot care.  This patient requires this care by a professional since this patient will be at risk due to having diabetes and coagulation defect. This patient is unable to cut nails himself since the patient cannot reach his nails.These nails are painful walking and wearing shoes.  This patient presents for at risk foot care today.  General Appearance  Alert, conversant and in no acute stress.  Vascular  Dorsalis pedis and posterior tibial  pulses are palpable  bilaterally.  Capillary return is within normal limits  bilaterally. Cold feet. bilaterally.  Neurologic  Senn-Weinstein monofilament wire test diminished/absent bilaterally. Muscle power within normal limits bilaterally.  Nails Thick disfigured discolored nails with subungual debris  from hallux to fifth toes bilaterally. No evidence of bacterial infection or drainage bilaterally.  Orthopedic  No limitations of motion  feet .  No crepitus or effusions noted.  No bony pathology or digital deformities noted.  Midfoot arthritis  B/L.  Swelling  B/L.  Skin  normotropic skin with no porokeratosis noted bilaterally.  No signs of infections or ulcers noted.     Onychomycosis  Pain in right toes  Pain in left toes  Consent was obtained for treatment procedures.   Mechanical debridement of nails 1-5  bilaterally performed with a nail nipper.  Filed with dremel without incident.    Return office visit    4  months                 Told patient to return for periodic foot care and evaluation due to potential at risk complications.   Cordella Bold DPM

## 2024-10-01 ENCOUNTER — Other Ambulatory Visit: Payer: Self-pay | Admitting: Family Medicine

## 2024-10-01 ENCOUNTER — Other Ambulatory Visit: Payer: Self-pay

## 2024-10-01 ENCOUNTER — Other Ambulatory Visit (HOSPITAL_COMMUNITY): Payer: Self-pay

## 2024-10-01 DIAGNOSIS — K219 Gastro-esophageal reflux disease without esophagitis: Secondary | ICD-10-CM

## 2024-10-01 MED ORDER — OMEPRAZOLE 40 MG PO CPDR
40.0000 mg | DELAYED_RELEASE_CAPSULE | Freq: Two times a day (BID) | ORAL | 1 refills | Status: AC
  Filled 2024-10-01: qty 180, 90d supply, fill #0
  Filled 2024-10-25 – 2024-12-31 (×2): qty 180, 90d supply, fill #1

## 2024-10-04 ENCOUNTER — Other Ambulatory Visit (HOSPITAL_COMMUNITY): Payer: Self-pay

## 2024-10-04 ENCOUNTER — Other Ambulatory Visit: Payer: Self-pay

## 2024-10-04 ENCOUNTER — Telehealth: Payer: Self-pay

## 2024-10-04 MED ORDER — FUROSEMIDE 40 MG PO TABS
40.0000 mg | ORAL_TABLET | Freq: Two times a day (BID) | ORAL | 0 refills | Status: DC
  Filled 2024-10-04: qty 180, 90d supply, fill #0

## 2024-10-04 NOTE — Telephone Encounter (Signed)
 Needing PA for Dulera ?

## 2024-10-04 NOTE — Telephone Encounter (Signed)
 Copied from CRM [id number]. Topic: Clinical - Prescription Issue >> Oct 04, 2024  3:10 PM Paige D wrote: Reason for CRM: Pt is calling in regards to his medication  mometasone -formoterol  (DULERA ) 100-5 MCG/ACT AERO Medication was rejected   Pt states pharmacy said insurance company needs more info  from pcp. Stated they faxed paper work that needed to be filled out and returned by pcp. Pt states he only has two day supply left. Please reach out to pt.

## 2024-10-05 ENCOUNTER — Other Ambulatory Visit (HOSPITAL_COMMUNITY): Payer: Self-pay

## 2024-10-05 NOTE — Telephone Encounter (Signed)
 Noted

## 2024-10-08 NOTE — Telephone Encounter (Signed)
 Clearance note sent to be scanned into the patient's chart.

## 2024-10-08 NOTE — Telephone Encounter (Signed)
 I spoke to Garrett [name] and I advised him that we heard back from Dr. Antonio Meth and she said that it was ok to hold his Xarelto  for 2 days prior to his procedure.  He asked about his Metformin  and I advised him him that he will hold that the morning of his procedure.  I also advised him that the hospital nurse will call him and review which medications they would also like for him to hold.  Patient agreed with the plan of care.

## 2024-10-19 ENCOUNTER — Ambulatory Visit: Payer: Self-pay

## 2024-10-19 ENCOUNTER — Ambulatory Visit: Admitting: Family

## 2024-10-19 VITALS — BP 139/81 | HR 89 | Temp 98.1°F | Resp 16 | Ht 69.0 in | Wt 206.0 lb

## 2024-10-19 DIAGNOSIS — J069 Acute upper respiratory infection, unspecified: Secondary | ICD-10-CM | POA: Diagnosis not present

## 2024-10-19 LAB — POC COVID19 BINAXNOW: SARS Coronavirus 2 Ag: NEGATIVE

## 2024-10-19 NOTE — Telephone Encounter (Signed)
 Appt scheduled

## 2024-10-19 NOTE — Telephone Encounter (Signed)
 FYI Only or Action Required?: FYI only for provider.  Patient was last seen in primary care on 08/12/2024 by Antonio Meth, Jamee SAUNDERS, DO.  Called Nurse Triage reporting Cough.  Symptoms began several days ago.  Interventions attempted: OTC medications: Halls Cough Drop  and Rest, hydration, or home remedies.  Symptoms are: gradually worsening.  Triage Disposition: See Physician Within 24 Hours  Patient/caregiver understands and will follow disposition?: Yes Copied from CRM #[id number]. Topic: Clinical - Red Word Triage >> Oct 19, 2024  3:11 PM Burnard DEL wrote: Red Word that prompted transfer to Nurse Triage:trouble breathing,coughing up discolored mucus Reason for Disposition  [1] Known COPD or other severe lung disease (i.e., bronchiectasis, cystic fibrosis, lung surgery) AND [2] symptoms getting worse (i.e., increased sputum purulence or amount, increased breathing difficulty  Answer Assessment - Initial Assessment Questions If trying to talk for an   1. ONSET: When did the cough begin?      Started 2 days  2. SEVERITY: How bad is the cough today?      Getting worse  3. SPUTUM: Describe the color of your sputum (e.g., none, dry cough; clear, white, yellow, green)     Thick, yellowish sputum  4. HEMOPTYSIS: Are you coughing up any blood? If Yes, ask: How much? (e.g., flecks, streaks, tablespoons, etc.)     No   5. DIFFICULTY BREATHING: Are you having difficulty breathing? If Yes, ask: How bad is it? (e.g., mild, moderate, severe)      Yes, mild shortness of breath. Gets winded when holding long conversation and ambulating  6. FEVER: Do you have a fever? If Yes, ask: What is your temperature, how was it measured, and when did it start?     No  7. CARDIAC HISTORY: Do you have any history of heart disease? (e.g., heart attack, congestive heart failure)      CHF,   8. LUNG HISTORY: Do you have any history of lung disease?  (e.g., pulmonary embolus, asthma,  emphysema)     OSA, PE, recurrent Pneumonia  9. PE RISK FACTORS: Do you have a history of blood clots? (or: recent major surgery, recent prolonged travel, bedridden)     No  10. OTHER SYMPTOMS: Do you have any other symptoms? (e.g., runny nose, wheezing, chest pain)       Chest feels sore from coughing, runny nose (in the morning)  11. PREGNANCY: Is there any chance you are pregnant? When was your last menstrual period?       No  12. TRAVEL: Have you traveled out of the country in the last month? (e.g., travel history, exposures)       No  Protocols used: Cough - Acute Productive-A-AH

## 2024-10-19 NOTE — Assessment & Plan Note (Addendum)
  Acute cough x 2 days, likely viral. Normal lung exam, normal oxygen  saturation, afebrile.   - Continue albuterol  inhaler or nebulizer twice daily until symptoms improve.  - Monitor oxygen  saturation, seek care if below 90%.  - Seek care if shortness of breath worsens or fever develops.  - Take Delsym , an over-the-counter cough suppressant as needed for cough  - Increase fluid intake.  - Expect improvement by next Monday; return if not improved.  - COVID-19 nasal swab test is negative.

## 2024-10-19 NOTE — Patient Instructions (Addendum)
 VISIT SUMMARY:  Today, you were seen for a cough and shortness of breath that started over the weekend. Your symptoms include a cough with some sputum production and a raw sensation in your windpipe. You have been using your albuterol  inhaler and nebulizer, and your oxygen  levels are good.  YOUR PLAN:  ACUTE COUGH: You have a cough, likely due to a viral infection. Your covid test was negative today. -Continue using your albuterol  inhaler and nebulizer twice daily until your symptoms improve. -Monitor your oxygen  levels and seek care if they drop below 90%. -Seek care if your shortness of breath worsens or if you develop a fever. -Take Delsym , an over-the-counter cough suppressant. -Increase your fluid intake. -You should start to feel better by next Monday. If not, please return for further evaluation.
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
## 2024-10-25 ENCOUNTER — Other Ambulatory Visit (HOSPITAL_COMMUNITY): Payer: Self-pay

## 2024-10-25 ENCOUNTER — Other Ambulatory Visit: Payer: Self-pay

## 2024-10-25 MED ORDER — XARELTO 10 MG PO TABS
10.0000 mg | ORAL_TABLET | Freq: Every day | ORAL | 3 refills | Status: AC
  Filled 2024-10-25 – 2024-10-29 (×2): qty 90, 90d supply, fill #0

## 2024-10-25 MED ORDER — ATORVASTATIN CALCIUM 80 MG PO TABS
80.0000 mg | ORAL_TABLET | Freq: Every day | ORAL | 0 refills | Status: AC
  Filled 2024-10-25: qty 90, 90d supply, fill #0

## 2024-10-25 MED ORDER — FUROSEMIDE 40 MG PO TABS
40.0000 mg | ORAL_TABLET | Freq: Two times a day (BID) | ORAL | 3 refills | Status: AC
  Filled 2024-12-31: qty 180, 90d supply, fill #0

## 2024-10-25 MED ORDER — DULERA 100-5 MCG/ACT IN AERO
2.0000 | INHALATION_SPRAY | Freq: Two times a day (BID) | RESPIRATORY_TRACT | 11 refills | Status: AC
  Filled 2024-10-25 – 2024-12-06 (×2): qty 13, 30d supply, fill #0
  Filled 2025-01-27: qty 13, 30d supply, fill #1

## 2024-10-25 MED ORDER — METOPROLOL TARTRATE 75 MG PO TABS
75.0000 mg | ORAL_TABLET | Freq: Two times a day (BID) | ORAL | 3 refills | Status: AC
  Filled 2024-12-31: qty 180, 90d supply, fill #0

## 2024-10-25 MED ORDER — POTASSIUM CHLORIDE CRYS ER 20 MEQ PO TBCR
20.0000 meq | EXTENDED_RELEASE_TABLET | Freq: Two times a day (BID) | ORAL | 3 refills | Status: AC
  Filled 2024-10-25 – 2024-11-10 (×2): qty 180, 90d supply, fill #0

## 2024-10-26 ENCOUNTER — Other Ambulatory Visit: Payer: Self-pay

## 2024-10-26 ENCOUNTER — Ambulatory Visit

## 2024-10-26 ENCOUNTER — Encounter: Payer: Self-pay | Admitting: Emergency Medicine

## 2024-10-26 ENCOUNTER — Ambulatory Visit
Admission: EM | Admit: 2024-10-26 | Discharge: 2024-10-26 | Disposition: A | Discharge status: Home / Self Care | Attending: Family Medicine | Admitting: Family Medicine

## 2024-10-26 DIAGNOSIS — R059 Cough, unspecified: Secondary | ICD-10-CM | POA: Diagnosis not present

## 2024-10-26 DIAGNOSIS — J45991 Cough variant asthma: Secondary | ICD-10-CM

## 2024-10-26 DIAGNOSIS — I872 Venous insufficiency (chronic) (peripheral): Secondary | ICD-10-CM

## 2024-10-26 DIAGNOSIS — J209 Acute bronchitis, unspecified: Secondary | ICD-10-CM

## 2024-10-26 DIAGNOSIS — J441 Chronic obstructive pulmonary disease with (acute) exacerbation: Secondary | ICD-10-CM | POA: Diagnosis not present

## 2024-10-26 MED ORDER — IPRATROPIUM-ALBUTEROL 0.5-2.5 (3) MG/3ML IN SOLN
3.0000 mL | Freq: Once | RESPIRATORY_TRACT | Status: AC
  Administered 2024-10-26: 3 mL via RESPIRATORY_TRACT

## 2024-10-26 MED ORDER — PREDNISONE 20 MG PO TABS
40.0000 mg | ORAL_TABLET | Freq: Once | ORAL | Status: AC
  Administered 2024-10-26: 40 mg via ORAL

## 2024-10-26 MED ORDER — DOXYCYCLINE HYCLATE 100 MG PO TABS
100.0000 mg | ORAL_TABLET | Freq: Once | ORAL | Status: AC
  Administered 2024-10-26: 100 mg via ORAL

## 2024-10-26 MED ORDER — DOXYCYCLINE HYCLATE 100 MG PO CAPS: 100.0000 mg | ORAL_CAPSULE | Freq: Two times a day (BID) | ORAL | 0 refills | Status: DC

## 2024-10-26 MED ORDER — PREDNISONE 20 MG PO TABS: 40.0000 mg | ORAL_TABLET | Freq: Every day | ORAL | 0 refills | Status: DC
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
## 2024-10-26 NOTE — Progress Notes (Signed)
 Pt has c/o BLE swelling (R>L at this time) and is wanting to f/u. He has been scheduled for a reflux study and to see MD.

## 2024-10-26 NOTE — Discharge Instructions (Signed)
 I have given you 1 dose of prednisone  (40 mg) and 1 dose of doxycycline  this afternoon Starting tomorrow you will take doxycycline  2 times a day.  Take this antibiotic with food Starting tomorrow you will take prednisone  40 mg once a day Continue your albuterol  inhaler or nebulizer Make sure you are drinking lots of fluids  Do not take your multivitamin with iron while you are on doxycycline  as it interferes with the medicine

## 2024-10-26 NOTE — ED Triage Notes (Signed)
 Pt reports a cough and nasal congestion x 10 days. States he was seen by his PCP on 10/28 and was dx with a viral URI and told to take OTC medication for symptoms. States he was seen by cardiology yesterday and they advised him to be seen for abx treatment d/t abnormal lung sounds.

## 2024-10-27 ENCOUNTER — Other Ambulatory Visit: Payer: Self-pay

## 2024-10-27 ENCOUNTER — Ambulatory Visit (HOSPITAL_COMMUNITY): Payer: Self-pay

## 2024-10-27 ENCOUNTER — Other Ambulatory Visit (HOSPITAL_COMMUNITY): Payer: Self-pay

## 2024-10-28 ENCOUNTER — Other Ambulatory Visit (HOSPITAL_COMMUNITY): Payer: Self-pay

## 2024-10-29 ENCOUNTER — Other Ambulatory Visit: Payer: Self-pay

## 2024-10-29 ENCOUNTER — Other Ambulatory Visit (HOSPITAL_COMMUNITY): Payer: Self-pay

## 2024-11-02 ENCOUNTER — Other Ambulatory Visit (HOSPITAL_COMMUNITY): Payer: Self-pay

## 2024-11-08 ENCOUNTER — Telehealth: Payer: Self-pay | Admitting: Gastroenterology

## 2024-11-08 ENCOUNTER — Encounter (HOSPITAL_COMMUNITY): Payer: Self-pay | Admitting: Gastroenterology

## 2024-11-08 NOTE — Telephone Encounter (Addendum)
 Procedure:Colonoscopy/Endoscopy Procedure date: 11/15/24 Procedure location: [facility] Arrival Time: 6:00 am Spoke with the patient Y/N: Yes Any prep concerns? No  Has the patient obtained the prep from the pharmacy ? Yes Do you have a care partner and transportation: Yes Any additional concerns? No

## 2024-11-08 NOTE — Progress Notes (Signed)
 Pre op call Garrett [name]    PCP-Lowne Chase DO CardiologistGLENWOOD Kallman [facility]a  EKG-09/10/24 Echo-04/28/23 Cath-02/07/23 Stress-05/21/23 ICD/PM-n/a GLP1-Ozempic  hold 1 week last dose 11/9 Blood Thinner-Xarelto  hold 2 days last dose 11/21  History: CHF,Murmur, CAD,PE,Asthmas, CKD, Bells Palsy, DM, OSA- Inspire device, cochlear implant. Patient last saw his cardiologist 11/4, due for f/u 6 months. Did go to ED 11/4 for a lingering cough, was dx Bronchitis placed on antibiotic and steroid- feeling a lot better finished meds on Sat. not having cough/sob. Patient denies any heart or breathing issues, no equipment use. Anesthesia Review- Yes- okay to proceed

## 2024-11-09 ENCOUNTER — Other Ambulatory Visit (HOSPITAL_COMMUNITY): Payer: Self-pay

## 2024-11-10 ENCOUNTER — Other Ambulatory Visit (HOSPITAL_COMMUNITY): Payer: Self-pay

## 2024-11-10 ENCOUNTER — Other Ambulatory Visit: Payer: Self-pay

## 2024-11-10 ENCOUNTER — Other Ambulatory Visit: Payer: Self-pay | Admitting: Family Medicine

## 2024-11-10 DIAGNOSIS — R059 Cough, unspecified: Secondary | ICD-10-CM

## 2024-11-10 MED ORDER — LEVOCETIRIZINE DIHYDROCHLORIDE 5 MG PO TABS
5.0000 mg | ORAL_TABLET | Freq: Every evening | ORAL | 1 refills | Status: AC
  Filled 2024-11-10: qty 90, 90d supply, fill #0

## 2024-11-15 ENCOUNTER — Ambulatory Visit (HOSPITAL_COMMUNITY)

## 2024-11-15 ENCOUNTER — Other Ambulatory Visit: Payer: Self-pay

## 2024-11-15 ENCOUNTER — Ambulatory Visit (HOSPITAL_COMMUNITY)
Admission: RE | Admit: 2024-11-15 | Discharge: 2024-11-15 | Disposition: A | Discharge status: Home / Self Care | Attending: Gastroenterology | Admitting: Gastroenterology

## 2024-11-15 ENCOUNTER — Encounter (HOSPITAL_COMMUNITY)
Admission: RE | Disposition: A | Discharge status: Home / Self Care | Payer: Self-pay | Source: Home / Self Care | Attending: Gastroenterology

## 2024-11-15 ENCOUNTER — Encounter (HOSPITAL_COMMUNITY): Payer: Self-pay | Admitting: Gastroenterology

## 2024-11-15 DIAGNOSIS — I2510 Atherosclerotic heart disease of native coronary artery without angina pectoris: Secondary | ICD-10-CM | POA: Insufficient documentation

## 2024-11-15 DIAGNOSIS — K5730 Diverticulosis of large intestine without perforation or abscess without bleeding: Secondary | ICD-10-CM | POA: Insufficient documentation

## 2024-11-15 DIAGNOSIS — I130 Hypertensive heart and chronic kidney disease with heart failure and stage 1 through stage 4 chronic kidney disease, or unspecified chronic kidney disease: Secondary | ICD-10-CM | POA: Insufficient documentation

## 2024-11-15 DIAGNOSIS — Z7901 Long term (current) use of anticoagulants: Secondary | ICD-10-CM | POA: Diagnosis not present

## 2024-11-15 DIAGNOSIS — K2970 Gastritis, unspecified, without bleeding: Secondary | ICD-10-CM | POA: Insufficient documentation

## 2024-11-15 DIAGNOSIS — Z860100 Personal history of colon polyps, unspecified: Secondary | ICD-10-CM | POA: Diagnosis not present

## 2024-11-15 DIAGNOSIS — K640 First degree hemorrhoids: Secondary | ICD-10-CM | POA: Diagnosis not present

## 2024-11-15 DIAGNOSIS — D122 Benign neoplasm of ascending colon: Secondary | ICD-10-CM | POA: Diagnosis not present

## 2024-11-15 DIAGNOSIS — J45909 Unspecified asthma, uncomplicated: Secondary | ICD-10-CM | POA: Diagnosis not present

## 2024-11-15 DIAGNOSIS — I5032 Chronic diastolic (congestive) heart failure: Secondary | ICD-10-CM

## 2024-11-15 DIAGNOSIS — Z7985 Long-term (current) use of injectable non-insulin antidiabetic drugs: Secondary | ICD-10-CM | POA: Insufficient documentation

## 2024-11-15 DIAGNOSIS — E1142 Type 2 diabetes mellitus with diabetic polyneuropathy: Secondary | ICD-10-CM | POA: Insufficient documentation

## 2024-11-15 DIAGNOSIS — Z79899 Other long term (current) drug therapy: Secondary | ICD-10-CM | POA: Diagnosis not present

## 2024-11-15 DIAGNOSIS — N189 Chronic kidney disease, unspecified: Secondary | ICD-10-CM | POA: Diagnosis not present

## 2024-11-15 DIAGNOSIS — K222 Esophageal obstruction: Secondary | ICD-10-CM | POA: Insufficient documentation

## 2024-11-15 DIAGNOSIS — Z1211 Encounter for screening for malignant neoplasm of colon: Secondary | ICD-10-CM | POA: Diagnosis present

## 2024-11-15 DIAGNOSIS — R197 Diarrhea, unspecified: Secondary | ICD-10-CM

## 2024-11-15 DIAGNOSIS — K3184 Gastroparesis: Secondary | ICD-10-CM | POA: Insufficient documentation

## 2024-11-15 DIAGNOSIS — K449 Diaphragmatic hernia without obstruction or gangrene: Secondary | ICD-10-CM | POA: Diagnosis not present

## 2024-11-15 DIAGNOSIS — K219 Gastro-esophageal reflux disease without esophagitis: Secondary | ICD-10-CM | POA: Insufficient documentation

## 2024-11-15 DIAGNOSIS — Z7984 Long term (current) use of oral hypoglycemic drugs: Secondary | ICD-10-CM | POA: Insufficient documentation

## 2024-11-15 DIAGNOSIS — Z87891 Personal history of nicotine dependence: Secondary | ICD-10-CM

## 2024-11-15 DIAGNOSIS — D120 Benign neoplasm of cecum: Secondary | ICD-10-CM | POA: Insufficient documentation

## 2024-11-15 DIAGNOSIS — E1143 Type 2 diabetes mellitus with diabetic autonomic (poly)neuropathy: Secondary | ICD-10-CM | POA: Diagnosis not present

## 2024-11-15 DIAGNOSIS — I110 Hypertensive heart disease with heart failure: Secondary | ICD-10-CM

## 2024-11-15 DIAGNOSIS — E1122 Type 2 diabetes mellitus with diabetic chronic kidney disease: Secondary | ICD-10-CM | POA: Diagnosis not present

## 2024-11-15 DIAGNOSIS — Q399 Congenital malformation of esophagus, unspecified: Secondary | ICD-10-CM | POA: Diagnosis not present

## 2024-11-15 DIAGNOSIS — R1310 Dysphagia, unspecified: Secondary | ICD-10-CM

## 2024-11-15 DIAGNOSIS — D124 Benign neoplasm of descending colon: Secondary | ICD-10-CM | POA: Diagnosis not present

## 2024-11-15 DIAGNOSIS — G4730 Sleep apnea, unspecified: Secondary | ICD-10-CM | POA: Diagnosis not present

## 2024-11-15 HISTORY — PX: POLYPECTOMY: SHX149

## 2024-11-15 HISTORY — PX: ESOPHAGOGASTRODUODENOSCOPY: SHX5428

## 2024-11-15 HISTORY — PX: BALLOON DILATION: SHX5330

## 2024-11-15 HISTORY — PX: COLONOSCOPY: SHX5424

## 2024-11-15 LAB — GLUCOSE, CAPILLARY: Glucose-Capillary: 109 mg/dL — ABNORMAL HIGH (ref 70–99)

## 2024-11-15 SURGERY — COLONOSCOPY
Anesthesia: Monitor Anesthesia Care

## 2024-11-15 MED ORDER — PROPOFOL 500 MG/50ML IV EMUL
INTRAVENOUS | Status: DC | PRN
  Administered 2024-11-15: 180 ug/kg/min via INTRAVENOUS

## 2024-11-15 MED ORDER — LIDOCAINE 2% (20 MG/ML) 5 ML SYRINGE
INTRAMUSCULAR | Status: DC | PRN
Start: 2024-11-15 — End: 2024-11-15
  Administered 2024-11-15: 40 mg via INTRAVENOUS

## 2024-11-15 MED ORDER — PHENYLEPHRINE 80 MCG/ML (10ML) SYRINGE FOR IV PUSH (FOR BLOOD PRESSURE SUPPORT)
PREFILLED_SYRINGE | INTRAVENOUS | Status: DC | PRN
  Administered 2024-11-15 (×4): 80 ug via INTRAVENOUS

## 2024-11-15 MED ORDER — PROPOFOL 10 MG/ML IV BOLUS
INTRAVENOUS | Status: DC | PRN
  Administered 2024-11-15 (×2): 20 mg via INTRAVENOUS

## 2024-11-15 MED ORDER — SODIUM CHLORIDE 0.9 % IV SOLN: INTRAVENOUS | Status: DC

## 2024-11-15 NOTE — Discharge Instructions (Signed)
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
## 2024-11-15 NOTE — Transfer of Care (Signed)
 Immediate Anesthesia Transfer of Care Note  Patient: Garrett [name]  Procedure(s) Performed: COLONOSCOPY EGD (ESOPHAGOGASTRODUODENOSCOPY) BALLOON DILATION POLYPECTOMY, INTESTINE  Patient Location: Endoscopy Unit  Anesthesia Type:MAC  Level of Consciousness: sedated  Airway & Oxygen  Therapy: Patient Spontanous Breathing and Patient connected to face mask oxygen   Post-op Assessment: Report given to RN and Post -op Vital signs reviewed and stable  Post vital signs: Reviewed and stable  Last Vitals:  Vitals Value Taken Time  BP    Temp    Pulse    Resp    SpO2      Last Pain:  Vitals:   11/15/24 0650  TempSrc: Temporal  PainSc: 0-No pain         Complications: No notable events documented.

## 2024-11-15 NOTE — Op Note (Signed)
 [facility] Patient Name: Garrett [name] Procedure Date: 11/15/2024 MRN: [id number] Attending MD: Lynnie Bring , MD, [id number] Date of Birth: 01/15/1951 CSN: [id number] Age: 73 Admit Type: Outpatient Procedure:                Colonoscopy Indications:              High risk colon cancer surveillance: Personal                            history of colonic polyps. History of diarrhea. Providers:                Lynnie Bring, MD, Farris Southgate, Technician, Willy Hummer, RN Referring MD:             Lynnie Bring, MD Medicines:                Monitored Anesthesia Care Complications:            No immediate complications. Estimated Blood Loss:     Estimated blood loss: none. Procedure:                Pre-Anesthesia Assessment:                           - Prior to the procedure, a History and Physical                            was performed, and patient medications and                            allergies were reviewed. The patient's tolerance of                            previous anesthesia was also reviewed. The risks                            and benefits of the procedure and the sedation                            options and risks were discussed with the patient.                            All questions were answered, and informed consent                            was obtained. Prior Anticoagulants: The patient has                            taken Xarelto  (rivaroxaban ), last dose was 3 days                            prior to procedure. ASA Grade Assessment: III - A  patient with severe systemic disease. After                            reviewing the risks and benefits, the patient was                            deemed in satisfactory condition to undergo the                            procedure.                           After obtaining informed consent, the colonoscope                            was passed  under direct vision. Throughout the                            procedure, the patient's blood pressure, pulse, and                            oxygen  saturations were monitored continuously. The                            [id number]-[id number] ([id number] Olympus colonoscope was                            introduced through the anus and advanced to the the                            cecum, identified by appendiceal orifice and                            ileocecal valve. The colonoscopy was performed                            without difficulty. The patient tolerated the                            procedure well. The quality of the bowel                            preparation was adequate to identify polyps greater                            than 5 mm in size. There was some retained stool                            which could not be fully washed. Overall over 85 to                            90% of the colonic mucosa was visualized  satisfactorily. Of note the small and flat lesions                            could have been missed. The ileocecal valve,                            appendiceal orifice, and rectum were photographed. Scope In: 7:54:44 AM Scope Out: 8:21:06 AM Scope Withdrawal Time: 0 hours 21 minutes 7 seconds  Total Procedure Duration: 0 hours 26 minutes 22 seconds  Findings:      Three sessile polyps were found in the ascending colon and cecum. The       polyps were 4 to 10 mm in size. These polyps were removed with a cold       snare. Resection and retrieval were complete.      Two sessile polyps were found in the proximal descending colon and mid       descending colon. The polyps were 6 to 8 mm in size. These polyps were       removed with a cold snare. Resection and retrieval were complete.      A few medium-mouthed diverticula were found in the sigmoid colon.      The colon (entire examined portion) appeared normal. Biopsies for       histology were taken  with a cold forceps for evaluation of microscopic       colitis.      Non-bleeding internal hemorrhoids were found during retroflexion. The       hemorrhoids were small and Grade I (internal hemorrhoids that do not       prolapse).      The exam was otherwise without abnormality on direct and retroflexion       views. Impression:               - Three 4 to 10 mm polyps in the ascending colon                            and in the cecum, removed with a cold snare.                            Resected and retrieved.                           - Two 6 to 8 mm polyps in the proximal descending                            colon and in the mid descending colon, removed with                            a cold snare. Resected and retrieved.                           - Mild sigmoid diverticulosis.                           - The entire examined colon is normal. Biopsied.                           -  Non-bleeding internal hemorrhoids.                           - The examination was otherwise normal on direct                            and retroflexion views. Moderate Sedation:      Not Applicable - Patient had care per Anesthesia. Recommendation:           - Patient has a contact number available for                            emergencies. The signs and symptoms of potential                            delayed complications were discussed with the                            patient. Return to normal activities tomorrow.                            Written discharge instructions were provided to the                            patient.                           - Resume previous diet.                           - Continue present medications.                           - Await pathology results.                           - Resume Xarelto  (rivaroxaban ) at prior dose in 2                            days.                           - The findings and recommendations were discussed                             with the patient's family. Procedure Code(s):        --- Professional ---                           [phone], Colonoscopy, flexible; with removal of                            tumor(s), polyp(s), or other lesion(s) by snare                            technique  54619, 59, Colonoscopy, flexible; with biopsy,                            single or multiple Diagnosis Code(s):        --- Professional ---                           Z86.010, Personal history of colonic polyps                           D12.2, Benign neoplasm of ascending colon                           D12.0, Benign neoplasm of cecum                           D12.4, Benign neoplasm of descending colon                           K64.0, First degree hemorrhoids                           K57.30, Diverticulosis of large intestine without                            perforation or abscess without bleeding CPT copyright 2022 [employer]. All rights reserved. The codes documented in this report are preliminary and upon coder review may  be revised to meet current compliance requirements. Lynnie Bring, MD 11/15/2024 8:37:15 AM This report has been signed electronically. Number of Addenda: 0

## 2024-11-15 NOTE — Anesthesia Preprocedure Evaluation (Signed)
 Anesthesia Evaluation  Patient identified by MRN, date of birth, ID band Patient awake    Reviewed: Allergy & Precautions, NPO status , Patient's Chart, lab work & pertinent test results, reviewed documented beta blocker date and time   History of Anesthesia Complications Negative for: history of anesthetic complications  Airway Mallampati: I  TM Distance: >3 FB Neck ROM: Full    Dental  (+) Edentulous Upper, Edentulous Lower, Upper Dentures, Dental Advisory Given   Pulmonary asthma , sleep apnea , former smoker, PE   Pulmonary exam normal breath sounds clear to auscultation       Cardiovascular hypertension, Pt. on home beta blockers and Pt. on medications + CAD and +CHF  Normal cardiovascular exam Rhythm:Regular Rate:Normal  TTE 2022 1. Left ventricular ejection fraction, by estimation, is 60 to 65%. The  left ventricle has normal function. The left ventricle has no regional  wall motion abnormalities. Left ventricular diastolic parameters were  normal.   2. Right ventricular systolic function is normal. The right ventricular  size is normal.   3. The mitral valve is normal in structure. No evidence of mitral valve  regurgitation. No evidence of mitral stenosis.   4. The aortic valve is tricuspid. Aortic valve regurgitation is trivial.  Mild aortic valve sclerosis is present, with no evidence of aortic valve  stenosis.   5. The inferior vena cava is normal in size with greater than 50%  respiratory variability, suggesting right atrial pressure of 3 mmHg.   Stress test 05/20/24 ([facility] - scanned in media on 9/22) 1.  Normal SPECT perfusion imaging 2.  Pre and post myocardial perfusion scans are within normal limits 3.  No significant ischemia detected 4.  No evidence of infarct 5.  Negative for detection of ischemia or scar 6.  The calculated ejection fraction is 68%      Neuro/Psych  Headaches PSYCHIATRIC DISORDERS      Dementia    GI/Hepatic Neg liver ROS, hiatal hernia,GERD  ,,  Endo/Other  diabetes, Type 2    Renal/GU Renal InsufficiencyRenal disease  negative genitourinary   Musculoskeletal  (+) Arthritis ,    Abdominal   Peds  Hematology  (+) Blood dyscrasia (xarelto )   Anesthesia Other Findings 73 yo male with PMH of former smoking, nonobstructive CAD (by cath in 2022), HFpEF, hx of recurrent PE (on Xarelto ), OSA (s/p Inspire in 03/2024), asthma, migraines, GERD, hiatal hernia s/p repair (2022), DM (A1c 6.1), anemia, arthritis, s/p lumbar fusion L2-L4 (2015, 2018), s/p ACDF (C5-C6)  Reproductive/Obstetrics                              Anesthesia Physical Anesthesia Plan  ASA: 3  Anesthesia Plan: MAC   Post-op Pain Management: Minimal or no pain anticipated   Induction: Intravenous  PONV Risk Score and Plan: 2 and Treatment may vary due to age or medical condition, TIVA and Ondansetron   Airway Management Planned: Natural Airway and Simple Face Mask  Additional Equipment: None  Intra-op Plan:   Post-operative Plan: Extubation in OR  Informed Consent: I have reviewed the patients History and Physical, chart, labs and discussed the procedure including the risks, benefits and alternatives for the proposed anesthesia with the patient or authorized representative who has indicated his/her understanding and acceptance.     Dental advisory given  Plan Discussed with: CRNA  Anesthesia Plan Comments:          Anesthesia Quick Evaluation

## 2024-11-15 NOTE — Anesthesia Postprocedure Evaluation (Signed)
 Anesthesia Post Note  Patient: Irfan Veal Espinal  Procedure(s) Performed: COLONOSCOPY EGD (ESOPHAGOGASTRODUODENOSCOPY) BALLOON DILATION POLYPECTOMY, INTESTINE     Patient location during evaluation: PACU Anesthesia Type: MAC Level of consciousness: awake and alert Pain management: pain level controlled Vital Signs Assessment: post-procedure vital signs reviewed and stable Respiratory status: spontaneous breathing, nonlabored ventilation, respiratory function stable and patient connected to nasal cannula oxygen  Cardiovascular status: stable and blood pressure returned to baseline Postop Assessment: no apparent nausea or vomiting Anesthetic complications: no   No notable events documented.  Last Vitals:  Vitals:   11/15/24 0840 11/15/24 0850  BP: (!) 96/59 108/65  Pulse: 73 61  Resp: 18 (!) 24  Temp:    SpO2: 95% 93%    Last Pain:  Vitals:   11/15/24 0850  TempSrc:   PainSc: 0-No pain                 Thom [name] Peoples

## 2024-11-15 NOTE — Op Note (Signed)
 [facility] Patient Name: Garrett [name] Procedure Date: 11/15/2024 MRN: [id number] Attending MD: Lynnie Bring , MD, [id number] Date of Birth: 05-16-1951 CSN: [id number] Age: 73 Admit Type: Outpatient Procedure:                Upper GI endoscopy Indications:              GERD with dysphagia history of fundoplication with                            2 redo fundoplication's Providers:                Lynnie Bring, MD, Willy Hummer, RN, Farris Southgate,                            Technician Referring MD:             Lynnie Bring, MD Medicines:                Monitored Anesthesia Care Complications:            No immediate complications. Estimated Blood Loss:     Estimated blood loss: none. Procedure:                Pre-Anesthesia Assessment:                           - Prior to the procedure, a History and Physical                            was performed, and patient medications and                            allergies were reviewed. The patient's tolerance of                            previous anesthesia was also reviewed. The risks                            and benefits of the procedure and the sedation                            options and risks were discussed with the patient.                            All questions were answered, and informed consent                            was obtained. Prior Anticoagulants: The patient has                            taken no anticoagulant or antiplatelet agents. ASA                            Grade Assessment: III - A patient with severe  systemic disease. After reviewing the risks and                            benefits, the patient was deemed in satisfactory                            condition to undergo the procedure.                           After obtaining informed consent, the endoscope was                            passed under direct vision. Throughout the                             procedure, the patient's blood pressure, pulse, and                            oxygen  saturations were monitored continuously. The                            [id number]-[id number] ([id number] Olympus endoscope was introduced                            through the mouth, and advanced to the second part                            of duodenum. The upper GI endoscopy was                            accomplished without difficulty. The patient                            tolerated the procedure well. Scope In: Scope Out: Findings:      The lower third of the esophagus was significantly tortuous.      One benign-appearing, intrinsic moderate stenosis was found at the       gastroesophageal junction. The stenosis was traversed. A TTS dilator was       passed through the scope. Dilation with an 18-19-20 mm balloon dilator       was performed to 20 mm.      A 4 cm hiatal hernia was present. Due to significant retained food, the       exam was somewhat limited. Lower esophagus and cardia could not be fully       evaluated. There was some suggestion of previous Nissen's fundoplication.      A medium amount of food (residue) was found in the cardia, in the       gastric fundus and in the gastric body.      Localized mild inflammation characterized by congestion (edema) and       erythema was found in the gastric antrum. Biopsies were taken with a       cold forceps for histology. No outlet obstruction.      The examined duodenum was normal. Biopsies for histology were taken with       a cold  forceps for evaluation of celiac disease. Impression:               - Tortuous esophagus.                           - Benign-appearing esophageal stenosis. Dilated.                           - 4 cm hiatal hernia. Limited exam due to retained                            food.                           - A medium amount of food (residue) in the stomach.                           - Gastritis. Biopsied.                           -  Normal examined duodenum. Biopsied. Moderate Sedation:      Not Applicable - Patient had care per Anesthesia. Recommendation:           - Patient has a contact number available for                            emergencies. The signs and symptoms of potential                            delayed complications were discussed with the                            patient. Return to normal activities tomorrow.                            Written discharge instructions were provided to the                            patient.                           - Resume previous diet.                           - Continue present medications.                           - Await pathology results.                           - Assess response to above dilation. I would                            recommend repeating EGD in 3 to 6 months after                            prolonged  fasting. Would also recommend outpatient                            gastric emptying scan to rule out gastroparesis.                           - The findings and recommendations were discussed                            with the patient's family. Procedure Code(s):        --- Professional ---                           [phone], Esophagogastroduodenoscopy, flexible,                            transoral; with transendoscopic balloon dilation of                            esophagus (less than 30 mm diameter)                           43239, 59, Esophagogastroduodenoscopy, flexible,                            transoral; with biopsy, single or multiple Diagnosis Code(s):        --- Professional ---                           Q39.9, Congenital malformation of esophagus,                            unspecified                           K22.2, Esophageal obstruction                           K44.9, Diaphragmatic hernia without obstruction or                            gangrene                           K29.70, Gastritis, unspecified, without bleeding                            R13.10, Dysphagia, unspecified CPT copyright 2022 [employer] Association. All rights reserved. The codes documented in this report are preliminary and upon coder review may  be revised to meet current compliance requirements. Lynnie Bring, MD 11/15/2024 8:32:10 AM This report has been signed electronically. Number of Addenda: 0

## 2024-11-16 LAB — SURGICAL PATHOLOGY

## 2024-11-17 ENCOUNTER — Ambulatory Visit: Payer: Self-pay | Admitting: Gastroenterology

## 2024-11-17 ENCOUNTER — Encounter (HOSPITAL_COMMUNITY): Payer: Self-pay | Admitting: Gastroenterology

## 2024-11-19 ENCOUNTER — Encounter: Payer: Self-pay | Admitting: Family Medicine

## 2024-11-19 ENCOUNTER — Other Ambulatory Visit (HOSPITAL_COMMUNITY): Payer: Self-pay

## 2024-11-22 ENCOUNTER — Other Ambulatory Visit (HOSPITAL_COMMUNITY): Payer: Self-pay

## 2024-11-22 ENCOUNTER — Other Ambulatory Visit: Payer: Self-pay | Admitting: Family Medicine

## 2024-11-22 ENCOUNTER — Other Ambulatory Visit: Payer: Self-pay

## 2024-11-22 MED ORDER — MECLIZINE HCL 25 MG PO TABS
25.0000 mg | ORAL_TABLET | Freq: Three times a day (TID) | ORAL | 0 refills | Status: AC | PRN
  Filled 2024-11-22: qty 30, 10d supply, fill #0

## 2024-11-22 NOTE — Telephone Encounter (Signed)
Med not on med list  Please advise.

## 2024-12-06 ENCOUNTER — Other Ambulatory Visit: Payer: Self-pay

## 2024-12-06 ENCOUNTER — Encounter: Payer: Self-pay | Admitting: Family Medicine

## 2024-12-06 ENCOUNTER — Other Ambulatory Visit: Payer: Self-pay | Admitting: Family Medicine

## 2024-12-06 ENCOUNTER — Other Ambulatory Visit (HOSPITAL_COMMUNITY): Payer: Self-pay

## 2024-12-06 MED ORDER — ONETOUCH VERIO FLEX SYSTEM W/DEVICE KIT
PACK | 0 refills | Status: AC
  Filled 2024-12-06: qty 1, 30d supply, fill #0

## 2024-12-06 MED ORDER — ONETOUCH VERIO VI STRP
ORAL_STRIP | 12 refills | Status: AC
  Filled 2024-12-06: qty 300, 90d supply, fill #0
  Filled 2024-12-08 – 2024-12-10 (×3): qty 300, 100d supply, fill #0

## 2024-12-06 MED ORDER — METFORMIN HCL ER 500 MG PO TB24
1000.0000 mg | ORAL_TABLET | Freq: Every day | ORAL | 0 refills | Status: AC
  Filled 2024-12-06: qty 180, 90d supply, fill #0

## 2024-12-07 ENCOUNTER — Other Ambulatory Visit: Payer: Self-pay

## 2024-12-07 ENCOUNTER — Other Ambulatory Visit (HOSPITAL_COMMUNITY): Payer: Self-pay

## 2024-12-08 ENCOUNTER — Other Ambulatory Visit (HOSPITAL_COMMUNITY): Payer: Self-pay

## 2024-12-08 ENCOUNTER — Other Ambulatory Visit: Payer: Self-pay

## 2024-12-09 ENCOUNTER — Other Ambulatory Visit (HOSPITAL_COMMUNITY): Payer: Self-pay

## 2024-12-09 ENCOUNTER — Other Ambulatory Visit (HOSPITAL_BASED_OUTPATIENT_CLINIC_OR_DEPARTMENT_OTHER): Payer: Self-pay

## 2024-12-09 MED FILL — Influenza Virus Vac Split High-Dose PF Susp Pref Syr 0.5ML: 0.5000 mL | INTRAMUSCULAR | 1 days supply | Qty: 0.5 | Fill #0 | Status: AC

## 2024-12-10 ENCOUNTER — Other Ambulatory Visit: Payer: Self-pay

## 2024-12-10 ENCOUNTER — Other Ambulatory Visit (HOSPITAL_COMMUNITY): Payer: Self-pay

## 2024-12-19 ENCOUNTER — Other Ambulatory Visit (HOSPITAL_COMMUNITY): Payer: Self-pay

## 2024-12-22 ENCOUNTER — Ambulatory Visit (HOSPITAL_COMMUNITY)
Admission: RE | Admit: 2024-12-22 | Discharge: 2024-12-22 | Disposition: A | Discharge status: Home / Self Care | Source: Ambulatory Visit | Attending: Vascular Surgery | Admitting: Vascular Surgery

## 2024-12-22 ENCOUNTER — Encounter: Payer: Self-pay | Admitting: Vascular Surgery

## 2024-12-22 ENCOUNTER — Ambulatory Visit: Admitting: Vascular Surgery

## 2024-12-22 VITALS — BP 128/87 | HR 80 | Temp 98.2°F | Ht 69.0 in | Wt 208.0 lb

## 2024-12-22 DIAGNOSIS — M7989 Other specified soft tissue disorders: Secondary | ICD-10-CM

## 2024-12-22 DIAGNOSIS — I872 Venous insufficiency (chronic) (peripheral): Secondary | ICD-10-CM | POA: Insufficient documentation
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
## 2024-12-28 ENCOUNTER — Ambulatory Visit (INDEPENDENT_AMBULATORY_CARE_PROVIDER_SITE_OTHER): Admitting: Podiatry

## 2024-12-28 ENCOUNTER — Encounter: Payer: Self-pay | Admitting: Podiatry

## 2024-12-28 DIAGNOSIS — M79675 Pain in left toe(s): Secondary | ICD-10-CM

## 2024-12-28 DIAGNOSIS — E1121 Type 2 diabetes mellitus with diabetic nephropathy: Secondary | ICD-10-CM | POA: Diagnosis not present

## 2024-12-28 DIAGNOSIS — B351 Tinea unguium: Secondary | ICD-10-CM

## 2024-12-28 DIAGNOSIS — I872 Venous insufficiency (chronic) (peripheral): Secondary | ICD-10-CM

## 2024-12-28 DIAGNOSIS — M79674 Pain in right toe(s): Secondary | ICD-10-CM | POA: Diagnosis not present

## 2024-12-28 NOTE — Progress Notes (Signed)
 This patient returns to my office for at risk foot care.  This patient requires this care by a professional since this patient will be at risk due to having diabetes and coagulation defect. This patient is unable to cut nails himself since the patient cannot reach his nails.These nails are painful walking and wearing shoes.  This patient presents for at risk foot care today.  General Appearance  Alert, conversant and in no acute stress.  Vascular  Dorsalis pedis and posterior tibial  pulses are palpable  bilaterally.  Capillary return is within normal limits  bilaterally. Cold feet. bilaterally.  Neurologic  Senn-Weinstein monofilament wire test diminished/absent bilaterally. Muscle power within normal limits bilaterally.  Nails Thick disfigured discolored nails with subungual debris  from hallux to fifth toes bilaterally. No evidence of bacterial infection or drainage bilaterally.  Orthopedic  No limitations of motion  feet .  No crepitus or effusions noted.  No bony pathology or digital deformities noted.  Midfoot arthritis  B/L.  Swelling  B/L.  Skin  normotropic skin with no porokeratosis noted bilaterally.  No signs of infections or ulcers noted.     Onychomycosis  Pain in right toes  Pain in left toes  Consent was obtained for treatment procedures.   Mechanical debridement of nails 1-5  bilaterally performed with a nail nipper.  Filed with dremel without incident.    Return office visit    3  months                 Told patient to return for periodic foot care and evaluation due to potential at risk complications.   Cordella Bold DPM

## 2024-12-30 ENCOUNTER — Other Ambulatory Visit (HOSPITAL_COMMUNITY): Payer: Self-pay

## 2024-12-30 ENCOUNTER — Other Ambulatory Visit: Payer: Self-pay

## 2024-12-30 ENCOUNTER — Encounter: Payer: Self-pay | Admitting: Family Medicine

## 2024-12-30 ENCOUNTER — Ambulatory Visit: Admitting: Family Medicine

## 2024-12-30 VITALS — BP 122/76 | HR 80 | Temp 97.6°F | Resp 18 | Ht 69.0 in | Wt 207.0 lb

## 2024-12-30 DIAGNOSIS — I10 Essential (primary) hypertension: Secondary | ICD-10-CM

## 2024-12-30 DIAGNOSIS — N182 Chronic kidney disease, stage 2 (mild): Secondary | ICD-10-CM | POA: Diagnosis not present

## 2024-12-30 DIAGNOSIS — I5032 Chronic diastolic (congestive) heart failure: Secondary | ICD-10-CM

## 2024-12-30 DIAGNOSIS — M48062 Spinal stenosis, lumbar region with neurogenic claudication: Secondary | ICD-10-CM

## 2024-12-30 DIAGNOSIS — J441 Chronic obstructive pulmonary disease with (acute) exacerbation: Secondary | ICD-10-CM | POA: Diagnosis not present

## 2024-12-30 DIAGNOSIS — K219 Gastro-esophageal reflux disease without esophagitis: Secondary | ICD-10-CM

## 2024-12-30 DIAGNOSIS — E1122 Type 2 diabetes mellitus with diabetic chronic kidney disease: Secondary | ICD-10-CM

## 2024-12-30 DIAGNOSIS — I2699 Other pulmonary embolism without acute cor pulmonale: Secondary | ICD-10-CM

## 2024-12-30 DIAGNOSIS — I2510 Atherosclerotic heart disease of native coronary artery without angina pectoris: Secondary | ICD-10-CM

## 2024-12-30 DIAGNOSIS — E1165 Type 2 diabetes mellitus with hyperglycemia: Secondary | ICD-10-CM

## 2024-12-30 DIAGNOSIS — I129 Hypertensive chronic kidney disease with stage 1 through stage 4 chronic kidney disease, or unspecified chronic kidney disease: Secondary | ICD-10-CM | POA: Diagnosis not present

## 2024-12-30 DIAGNOSIS — Z7985 Long-term (current) use of injectable non-insulin antidiabetic drugs: Secondary | ICD-10-CM | POA: Diagnosis not present

## 2024-12-30 DIAGNOSIS — D500 Iron deficiency anemia secondary to blood loss (chronic): Secondary | ICD-10-CM | POA: Diagnosis not present

## 2024-12-30 MED ORDER — ONETOUCH ULTRASOFT LANCETS MISC
12 refills | Status: AC
  Filled 2024-12-30: qty 100, 90d supply, fill #0

## 2024-12-30 MED ORDER — OZEMPIC (0.25 OR 0.5 MG/DOSE) 2 MG/3ML ~~LOC~~ SOPN
0.5000 mg | PEN_INJECTOR | SUBCUTANEOUS | 1 refills | Status: AC
  Filled 2024-12-30: qty 9, 84d supply, fill #0

## 2024-12-30 NOTE — Assessment & Plan Note (Signed)
 Check labs  F/u cardiology annually

## 2024-12-30 NOTE — Assessment & Plan Note (Signed)
 Well controlled, no changes to meds. Encouraged heart healthy diet such as the DASH diet and exercise as tolerated.

## 2024-12-30 NOTE — Assessment & Plan Note (Signed)
 S/p surgery
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
## 2024-12-30 NOTE — Assessment & Plan Note (Signed)
 Check labs

## 2024-12-30 NOTE — Assessment & Plan Note (Signed)
On xaralto 

## 2024-12-30 NOTE — Assessment & Plan Note (Signed)
 Controlled Per pulmonary

## 2024-12-30 NOTE — Assessment & Plan Note (Signed)
 Per endo

## 2024-12-31 ENCOUNTER — Other Ambulatory Visit: Payer: Self-pay

## 2024-12-31 LAB — COMPREHENSIVE METABOLIC PANEL WITH GFR
ALT: 31 U/L (ref 3–53)
AST: 25 U/L (ref 5–37)
Albumin: 4.2 g/dL (ref 3.5–5.2)
Alkaline Phosphatase: 43 U/L (ref 39–117)
BUN: 30 mg/dL — ABNORMAL HIGH (ref 6–23)
CO2: 27 meq/L (ref 19–32)
Calcium: 9.3 mg/dL (ref 8.4–10.5)
Chloride: 101 meq/L (ref 96–112)
Creatinine, Ser: 1.82 mg/dL — ABNORMAL HIGH (ref 0.40–1.50)
GFR: 36.09 mL/min — ABNORMAL LOW
Glucose, Bld: 117 mg/dL — ABNORMAL HIGH (ref 70–99)
Potassium: 3.9 meq/L (ref 3.5–5.1)
Sodium: 140 meq/L (ref 135–145)
Total Bilirubin: 0.4 mg/dL (ref 0.2–1.2)
Total Protein: 6.9 g/dL (ref 6.0–8.3)

## 2024-12-31 LAB — CBC WITH DIFFERENTIAL/PLATELET
Basophils Absolute: 0 K/uL (ref 0.0–0.1)
Basophils Relative: 0.5 % (ref 0.0–3.0)
Eosinophils Absolute: 0.2 K/uL (ref 0.0–0.7)
Eosinophils Relative: 2 % (ref 0.0–5.0)
HCT: 38.6 % — ABNORMAL LOW (ref 39.0–52.0)
Hemoglobin: 12.6 g/dL — ABNORMAL LOW (ref 13.0–17.0)
Lymphocytes Relative: 17.4 % (ref 12.0–46.0)
Lymphs Abs: 1.4 K/uL (ref 0.7–4.0)
MCHC: 32.7 g/dL (ref 30.0–36.0)
MCV: 89.9 fl (ref 78.0–100.0)
Monocytes Absolute: 0.7 K/uL (ref 0.1–1.0)
Monocytes Relative: 8 % (ref 3.0–12.0)
Neutro Abs: 5.9 K/uL (ref 1.4–7.7)
Neutrophils Relative %: 72.1 % (ref 43.0–77.0)
Platelets: 300 K/uL (ref 150.0–400.0)
RBC: 4.29 Mil/uL (ref 4.22–5.81)
RDW: 18 % — ABNORMAL HIGH (ref 11.5–15.5)
WBC: 8.2 K/uL (ref 4.0–10.5)

## 2024-12-31 LAB — LIPID PANEL
Cholesterol: 132 mg/dL (ref 28–200)
HDL: 33.5 mg/dL — ABNORMAL LOW
LDL Cholesterol: 31 mg/dL (ref 10–99)
NonHDL: 98.85
Total CHOL/HDL Ratio: 4
Triglycerides: 341 mg/dL — ABNORMAL HIGH (ref 10.0–149.0)
VLDL: 68.2 mg/dL — ABNORMAL HIGH (ref 0.0–40.0)

## 2024-12-31 LAB — HEMOGLOBIN A1C: Hgb A1c MFr Bld: 7 % — ABNORMAL HIGH (ref 4.6–6.5)

## 2025-01-03 ENCOUNTER — Ambulatory Visit: Payer: Self-pay | Admitting: Family Medicine

## 2025-01-03 ENCOUNTER — Other Ambulatory Visit (HOSPITAL_COMMUNITY): Payer: Self-pay

## 2025-01-04 ENCOUNTER — Other Ambulatory Visit (HOSPITAL_COMMUNITY): Payer: Self-pay

## 2025-01-04 ENCOUNTER — Other Ambulatory Visit: Payer: Self-pay

## 2025-01-27 ENCOUNTER — Other Ambulatory Visit: Payer: Self-pay

## 2025-01-28 ENCOUNTER — Encounter: Payer: Self-pay | Admitting: Pharmacist

## 2025-01-28 ENCOUNTER — Other Ambulatory Visit: Payer: Self-pay

## 2025-03-16 ENCOUNTER — Ambulatory Visit: Admitting: Vascular Surgery

## 2025-03-29 ENCOUNTER — Ambulatory Visit: Admitting: Podiatry

## 2025-07-01 ENCOUNTER — Encounter: Admitting: Family Medicine

## 2025-07-19 ENCOUNTER — Ambulatory Visit
